# Patient Record
Sex: Female | Born: 1945
Health system: Southern US, Community
[De-identification: ages and names within clinical notes are randomized; demographics above are authoritative.]

## PROBLEM LIST (undated history)

## (undated) DIAGNOSIS — I420 Dilated cardiomyopathy: Secondary | ICD-10-CM

## (undated) DIAGNOSIS — F329 Major depressive disorder, single episode, unspecified: Secondary | ICD-10-CM

## (undated) DIAGNOSIS — S0990XA Unspecified injury of head, initial encounter: Secondary | ICD-10-CM

## (undated) DIAGNOSIS — A6920 Lyme disease, unspecified: Secondary | ICD-10-CM

## (undated) DIAGNOSIS — M5136 Other intervertebral disc degeneration, lumbar region: Secondary | ICD-10-CM

## (undated) DIAGNOSIS — I10 Essential (primary) hypertension: Secondary | ICD-10-CM

## (undated) DIAGNOSIS — Z86718 Personal history of other venous thrombosis and embolism: Secondary | ICD-10-CM

## (undated) DIAGNOSIS — J309 Allergic rhinitis, unspecified: Secondary | ICD-10-CM

## (undated) DIAGNOSIS — Z87898 Personal history of other specified conditions: Secondary | ICD-10-CM

## (undated) DIAGNOSIS — R1011 Right upper quadrant pain: Secondary | ICD-10-CM

## (undated) DIAGNOSIS — N135 Crossing vessel and stricture of ureter without hydronephrosis: Secondary | ICD-10-CM

## (undated) DIAGNOSIS — I2510 Atherosclerotic heart disease of native coronary artery without angina pectoris: Principal | ICD-10-CM

## (undated) DIAGNOSIS — K682 Retroperitoneal fibrosis: Secondary | ICD-10-CM

## (undated) DIAGNOSIS — H269 Unspecified cataract: Secondary | ICD-10-CM

## (undated) DIAGNOSIS — N1330 Unspecified hydronephrosis: Secondary | ICD-10-CM

## (undated) DIAGNOSIS — T4145XA Adverse effect of unspecified anesthetic, initial encounter: Secondary | ICD-10-CM

## (undated) DIAGNOSIS — M1990 Unspecified osteoarthritis, unspecified site: Secondary | ICD-10-CM

## (undated) DIAGNOSIS — I4821 Permanent atrial fibrillation: Secondary | ICD-10-CM

## (undated) DIAGNOSIS — R0602 Shortness of breath: Secondary | ICD-10-CM

## (undated) DIAGNOSIS — Z8619 Personal history of other infectious and parasitic diseases: Secondary | ICD-10-CM

## (undated) DIAGNOSIS — J189 Pneumonia, unspecified organism: Secondary | ICD-10-CM

## (undated) DIAGNOSIS — I499 Cardiac arrhythmia, unspecified: Secondary | ICD-10-CM

## (undated) DIAGNOSIS — I350 Nonrheumatic aortic (valve) stenosis: Secondary | ICD-10-CM

## (undated) DIAGNOSIS — I639 Cerebral infarction, unspecified: Secondary | ICD-10-CM

## (undated) DIAGNOSIS — F419 Anxiety disorder, unspecified: Secondary | ICD-10-CM

## (undated) DIAGNOSIS — M51369 Other intervertebral disc degeneration, lumbar region without mention of lumbar back pain or lower extremity pain: Secondary | ICD-10-CM

## (undated) DIAGNOSIS — K219 Gastro-esophageal reflux disease without esophagitis: Secondary | ICD-10-CM

## (undated) DIAGNOSIS — E785 Hyperlipidemia, unspecified: Secondary | ICD-10-CM

## (undated) DIAGNOSIS — Z8669 Personal history of other diseases of the nervous system and sense organs: Secondary | ICD-10-CM

## (undated) DIAGNOSIS — R350 Frequency of micturition: Secondary | ICD-10-CM

## (undated) DIAGNOSIS — Z8719 Personal history of other diseases of the digestive system: Secondary | ICD-10-CM

## (undated) HISTORY — DX: Unspecified cataract: H26.9

## (undated) HISTORY — PX: EYE SURGERY: SHX253

## (undated) HISTORY — DX: Right upper quadrant pain: R10.11

## (undated) HISTORY — PX: OTHER SURGICAL HISTORY: SHX169

## (undated) HISTORY — DX: Allergic rhinitis, unspecified: J30.9

## (undated) HISTORY — DX: Shortness of breath: R06.02

## (undated) HISTORY — DX: Major depressive disorder, single episode, unspecified: F32.9

## (undated) HISTORY — DX: Personal history of other specified conditions: Z87.898

## (undated) HISTORY — DX: Hyperlipidemia, unspecified: E78.5

## (undated) HISTORY — PX: TONSILLECTOMY: SUR1361

## (undated) HISTORY — DX: Permanent atrial fibrillation: I48.21

## (undated) HISTORY — DX: Dilated cardiomyopathy: I42.0

## (undated) HISTORY — DX: Nonrheumatic aortic (valve) stenosis: I35.0

## (undated) HISTORY — PX: CARDIOVASCULAR STRESS TEST: SHX262

## (undated) HISTORY — DX: Lyme disease, unspecified: A69.20

## (undated) HISTORY — PX: BREAST MASS EXCISION: SHX1267

## (undated) HISTORY — DX: Unspecified osteoarthritis, unspecified site: M19.90

## (undated) HISTORY — DX: Atherosclerotic heart disease of native coronary artery without angina pectoris: I25.10

---

## 1999-01-09 ENCOUNTER — Other Ambulatory Visit: Admission: RE | Admit: 1999-01-09 | Discharge: 1999-01-09 | Payer: Self-pay | Admitting: Internal Medicine

## 1999-01-09 ENCOUNTER — Encounter (INDEPENDENT_AMBULATORY_CARE_PROVIDER_SITE_OTHER): Payer: Self-pay | Admitting: Specialist

## 2000-10-08 ENCOUNTER — Other Ambulatory Visit: Admission: RE | Admit: 2000-10-08 | Discharge: 2000-10-08 | Payer: Self-pay | Admitting: Internal Medicine

## 2003-03-24 HISTORY — PX: EXPLORATORY LAPAROTOMY: SUR591

## 2003-03-24 HISTORY — PX: KNEE ARTHROSCOPY: SUR90

## 2003-10-25 ENCOUNTER — Ambulatory Visit (HOSPITAL_COMMUNITY): Admission: RE | Admit: 2003-10-25 | Discharge: 2003-10-25 | Payer: Self-pay | Admitting: Urology

## 2003-10-25 ENCOUNTER — Encounter (INDEPENDENT_AMBULATORY_CARE_PROVIDER_SITE_OTHER): Payer: Self-pay | Admitting: *Deleted

## 2003-10-25 ENCOUNTER — Ambulatory Visit (HOSPITAL_BASED_OUTPATIENT_CLINIC_OR_DEPARTMENT_OTHER): Admission: RE | Admit: 2003-10-25 | Discharge: 2003-10-25 | Payer: Self-pay | Admitting: Urology

## 2003-10-25 ENCOUNTER — Encounter (INDEPENDENT_AMBULATORY_CARE_PROVIDER_SITE_OTHER): Payer: Self-pay | Admitting: Specialist

## 2003-10-30 ENCOUNTER — Ambulatory Visit (HOSPITAL_COMMUNITY): Admission: RE | Admit: 2003-10-30 | Discharge: 2003-10-30 | Payer: Self-pay | Admitting: Urology

## 2003-11-07 ENCOUNTER — Encounter (INDEPENDENT_AMBULATORY_CARE_PROVIDER_SITE_OTHER): Payer: Self-pay | Admitting: *Deleted

## 2003-11-07 ENCOUNTER — Ambulatory Visit (HOSPITAL_COMMUNITY): Admission: RE | Admit: 2003-11-07 | Discharge: 2003-11-07 | Payer: Self-pay | Admitting: Urology

## 2003-12-06 ENCOUNTER — Encounter (INDEPENDENT_AMBULATORY_CARE_PROVIDER_SITE_OTHER): Payer: Self-pay | Admitting: Urology

## 2003-12-06 ENCOUNTER — Inpatient Hospital Stay (HOSPITAL_COMMUNITY): Admission: RE | Admit: 2003-12-06 | Discharge: 2003-12-08 | Payer: Self-pay | Admitting: Urology

## 2003-12-06 ENCOUNTER — Encounter (INDEPENDENT_AMBULATORY_CARE_PROVIDER_SITE_OTHER): Payer: Self-pay | Admitting: *Deleted

## 2004-02-18 ENCOUNTER — Ambulatory Visit (HOSPITAL_COMMUNITY): Admission: RE | Admit: 2004-02-18 | Discharge: 2004-02-18 | Payer: Self-pay | Admitting: Urology

## 2004-02-19 ENCOUNTER — Ambulatory Visit: Payer: Self-pay | Admitting: Hematology and Oncology

## 2004-02-26 ENCOUNTER — Ambulatory Visit (HOSPITAL_COMMUNITY): Admission: RE | Admit: 2004-02-26 | Discharge: 2004-02-26 | Payer: Self-pay | Admitting: Hematology and Oncology

## 2004-03-05 ENCOUNTER — Ambulatory Visit (HOSPITAL_COMMUNITY): Admission: RE | Admit: 2004-03-05 | Discharge: 2004-03-05 | Payer: Self-pay | Admitting: Hematology and Oncology

## 2004-03-05 ENCOUNTER — Encounter (INDEPENDENT_AMBULATORY_CARE_PROVIDER_SITE_OTHER): Payer: Self-pay | Admitting: Interventional Radiology

## 2004-03-05 ENCOUNTER — Encounter (INDEPENDENT_AMBULATORY_CARE_PROVIDER_SITE_OTHER): Payer: Self-pay | Admitting: *Deleted

## 2004-03-12 ENCOUNTER — Ambulatory Visit (HOSPITAL_COMMUNITY): Admission: RE | Admit: 2004-03-12 | Discharge: 2004-03-12 | Payer: Self-pay | Admitting: Hematology and Oncology

## 2004-03-13 ENCOUNTER — Encounter (INDEPENDENT_AMBULATORY_CARE_PROVIDER_SITE_OTHER): Payer: Self-pay | Admitting: *Deleted

## 2004-03-13 ENCOUNTER — Other Ambulatory Visit: Admission: RE | Admit: 2004-03-13 | Discharge: 2004-03-13 | Payer: Self-pay | Admitting: Hematology and Oncology

## 2004-03-20 ENCOUNTER — Ambulatory Visit (HOSPITAL_BASED_OUTPATIENT_CLINIC_OR_DEPARTMENT_OTHER): Admission: RE | Admit: 2004-03-20 | Discharge: 2004-03-20 | Payer: Self-pay | Admitting: Urology

## 2004-03-20 ENCOUNTER — Ambulatory Visit (HOSPITAL_COMMUNITY): Admission: RE | Admit: 2004-03-20 | Discharge: 2004-03-20 | Payer: Self-pay | Admitting: Urology

## 2004-03-21 ENCOUNTER — Ambulatory Visit (HOSPITAL_COMMUNITY): Admission: RE | Admit: 2004-03-21 | Discharge: 2004-03-21 | Payer: Self-pay | Admitting: Hematology and Oncology

## 2004-03-25 ENCOUNTER — Ambulatory Visit: Admission: RE | Admit: 2004-03-25 | Discharge: 2004-05-02 | Payer: Self-pay | Admitting: Radiation Oncology

## 2004-03-26 ENCOUNTER — Ambulatory Visit (HOSPITAL_COMMUNITY): Admission: RE | Admit: 2004-03-26 | Discharge: 2004-03-26 | Payer: Self-pay | Admitting: Hematology and Oncology

## 2004-04-10 ENCOUNTER — Ambulatory Visit: Payer: Self-pay | Admitting: Hematology and Oncology

## 2004-04-17 ENCOUNTER — Ambulatory Visit (HOSPITAL_COMMUNITY): Admission: RE | Admit: 2004-04-17 | Discharge: 2004-04-17 | Payer: Self-pay | Admitting: Hematology and Oncology

## 2004-05-28 ENCOUNTER — Ambulatory Visit: Payer: Self-pay | Admitting: Hematology and Oncology

## 2004-07-15 ENCOUNTER — Ambulatory Visit: Payer: Self-pay | Admitting: Hematology and Oncology

## 2004-08-05 ENCOUNTER — Ambulatory Visit (HOSPITAL_COMMUNITY): Admission: RE | Admit: 2004-08-05 | Discharge: 2004-08-05 | Payer: Self-pay | Admitting: Hematology and Oncology

## 2004-08-07 ENCOUNTER — Ambulatory Visit (HOSPITAL_BASED_OUTPATIENT_CLINIC_OR_DEPARTMENT_OTHER): Admission: RE | Admit: 2004-08-07 | Discharge: 2004-08-07 | Payer: Self-pay | Admitting: Urology

## 2004-09-02 ENCOUNTER — Ambulatory Visit: Payer: Self-pay | Admitting: Hematology and Oncology

## 2004-10-03 ENCOUNTER — Ambulatory Visit: Admission: RE | Admit: 2004-10-03 | Discharge: 2004-11-30 | Payer: Self-pay | Admitting: Radiation Oncology

## 2004-11-06 ENCOUNTER — Ambulatory Visit: Payer: Self-pay | Admitting: Hematology and Oncology

## 2004-11-27 ENCOUNTER — Ambulatory Visit (HOSPITAL_COMMUNITY): Admission: RE | Admit: 2004-11-27 | Discharge: 2004-11-27 | Payer: Self-pay | Admitting: Urology

## 2004-11-27 ENCOUNTER — Ambulatory Visit (HOSPITAL_BASED_OUTPATIENT_CLINIC_OR_DEPARTMENT_OTHER): Admission: RE | Admit: 2004-11-27 | Discharge: 2004-11-27 | Payer: Self-pay | Admitting: Urology

## 2004-12-24 ENCOUNTER — Ambulatory Visit: Payer: Self-pay | Admitting: Hematology and Oncology

## 2004-12-26 ENCOUNTER — Ambulatory Visit (HOSPITAL_COMMUNITY): Admission: RE | Admit: 2004-12-26 | Discharge: 2004-12-26 | Payer: Self-pay | Admitting: Hematology and Oncology

## 2005-01-29 ENCOUNTER — Ambulatory Visit (HOSPITAL_COMMUNITY): Admission: RE | Admit: 2005-01-29 | Discharge: 2005-01-29 | Payer: Self-pay | Admitting: Hematology and Oncology

## 2005-02-13 ENCOUNTER — Ambulatory Visit: Payer: Self-pay | Admitting: Hematology and Oncology

## 2005-03-05 ENCOUNTER — Ambulatory Visit (HOSPITAL_BASED_OUTPATIENT_CLINIC_OR_DEPARTMENT_OTHER): Admission: RE | Admit: 2005-03-05 | Discharge: 2005-03-05 | Payer: Self-pay | Admitting: Urology

## 2005-03-05 ENCOUNTER — Ambulatory Visit (HOSPITAL_COMMUNITY): Admission: RE | Admit: 2005-03-05 | Discharge: 2005-03-05 | Payer: Self-pay | Admitting: Urology

## 2005-03-23 HISTORY — PX: OTHER SURGICAL HISTORY: SHX169

## 2005-03-31 ENCOUNTER — Ambulatory Visit: Payer: Self-pay | Admitting: Hematology and Oncology

## 2005-05-19 ENCOUNTER — Ambulatory Visit (HOSPITAL_COMMUNITY): Admission: RE | Admit: 2005-05-19 | Discharge: 2005-05-19 | Payer: Self-pay | Admitting: Hematology and Oncology

## 2005-05-27 ENCOUNTER — Ambulatory Visit: Payer: Self-pay | Admitting: Hematology and Oncology

## 2005-06-02 ENCOUNTER — Ambulatory Visit (HOSPITAL_COMMUNITY): Admission: RE | Admit: 2005-06-02 | Discharge: 2005-06-02 | Payer: Self-pay | Admitting: Hematology and Oncology

## 2005-06-11 ENCOUNTER — Ambulatory Visit (HOSPITAL_BASED_OUTPATIENT_CLINIC_OR_DEPARTMENT_OTHER): Admission: RE | Admit: 2005-06-11 | Discharge: 2005-06-11 | Payer: Self-pay | Admitting: Urology

## 2005-06-18 ENCOUNTER — Encounter: Admission: RE | Admit: 2005-06-18 | Discharge: 2005-06-18 | Payer: Self-pay | Admitting: Hematology and Oncology

## 2005-07-07 LAB — PROTIME-INR

## 2005-07-21 ENCOUNTER — Ambulatory Visit: Payer: Self-pay | Admitting: Hematology and Oncology

## 2005-07-21 ENCOUNTER — Encounter: Admission: RE | Admit: 2005-07-21 | Discharge: 2005-07-21 | Payer: Self-pay | Admitting: Hematology and Oncology

## 2005-07-21 LAB — PROTIME-INR: Protime: 17.1 Seconds (ref 10.6–13.4)

## 2005-07-30 LAB — PROTIME-INR: INR: 1.6 — ABNORMAL LOW (ref 2.00–3.50)

## 2005-08-13 LAB — PROTIME-INR
INR: 1.4 — ABNORMAL LOW (ref 2.00–3.50)
Protime: 14.5 Seconds — ABNORMAL HIGH (ref 10.6–13.4)

## 2005-08-28 ENCOUNTER — Ambulatory Visit (HOSPITAL_COMMUNITY): Admission: RE | Admit: 2005-08-28 | Discharge: 2005-08-28 | Payer: Self-pay | Admitting: Hematology and Oncology

## 2005-08-28 LAB — CBC WITH DIFFERENTIAL/PLATELET
BASO%: 0.4 % (ref 0.0–2.0)
EOS%: 0.8 % (ref 0.0–7.0)
MCH: 30.2 pg (ref 26.0–34.0)
MCHC: 33.6 g/dL (ref 32.0–36.0)
RBC: 3.9 10*6/uL (ref 3.70–5.32)
RDW: 14.5 % (ref 11.3–14.5)
lymph#: 0.9 10*3/uL (ref 0.9–3.3)

## 2005-08-28 LAB — COMPREHENSIVE METABOLIC PANEL
AST: 13 U/L (ref 0–37)
Albumin: 4.1 g/dL (ref 3.5–5.2)
Alkaline Phosphatase: 97 U/L (ref 39–117)
BUN: 15 mg/dL (ref 6–23)
Creatinine, Ser: 0.88 mg/dL (ref 0.40–1.20)
Glucose, Bld: 92 mg/dL (ref 70–99)
Potassium: 4.1 mEq/L (ref 3.5–5.3)
Total Bilirubin: 0.5 mg/dL (ref 0.3–1.2)

## 2005-08-28 LAB — PROTIME-INR
INR: 1.9 — ABNORMAL LOW (ref 2.00–3.50)
Protime: 17.1 Seconds — ABNORMAL HIGH (ref 10.6–13.4)

## 2005-09-02 LAB — PROTIME-INR
INR: 2.3 (ref 2.00–3.50)
Protime: 18.5 Seconds — ABNORMAL HIGH (ref 10.6–13.4)

## 2005-09-08 ENCOUNTER — Ambulatory Visit: Payer: Self-pay | Admitting: Hematology and Oncology

## 2005-09-08 LAB — PROTIME-INR

## 2005-09-08 LAB — COMPREHENSIVE METABOLIC PANEL
ALT: 12 U/L (ref 0–40)
AST: 15 U/L (ref 0–37)
CO2: 23 mEq/L (ref 19–32)
Chloride: 109 mEq/L (ref 96–112)
Sodium: 140 mEq/L (ref 135–145)
Total Bilirubin: 0.4 mg/dL (ref 0.3–1.2)
Total Protein: 6.1 g/dL (ref 6.0–8.3)

## 2005-09-08 LAB — CBC WITH DIFFERENTIAL/PLATELET
Basophils Absolute: 0 10*3/uL (ref 0.0–0.1)
EOS%: 1.1 % (ref 0.0–7.0)
Eosinophils Absolute: 0 10*3/uL (ref 0.0–0.5)
HGB: 11.2 g/dL — ABNORMAL LOW (ref 11.6–15.9)
MCV: 89.6 fL (ref 81.0–101.0)
MONO%: 11.8 % (ref 0.0–13.0)
NEUT#: 2 10*3/uL (ref 1.5–6.5)
RBC: 3.69 10*6/uL — ABNORMAL LOW (ref 3.70–5.32)
RDW: 14.9 % — ABNORMAL HIGH (ref 11.3–14.5)
WBC: 3.3 10*3/uL — ABNORMAL LOW (ref 3.9–10.0)
lymph#: 0.9 10*3/uL (ref 0.9–3.3)

## 2005-10-09 LAB — PROTIME-INR: INR: 2.1 (ref 2.00–3.50)

## 2005-10-20 ENCOUNTER — Ambulatory Visit (HOSPITAL_COMMUNITY): Admission: RE | Admit: 2005-10-20 | Discharge: 2005-10-20 | Payer: Self-pay | Admitting: Hematology and Oncology

## 2005-10-20 ENCOUNTER — Encounter (INDEPENDENT_AMBULATORY_CARE_PROVIDER_SITE_OTHER): Payer: Self-pay | Admitting: Specialist

## 2005-10-26 ENCOUNTER — Ambulatory Visit: Payer: Self-pay | Admitting: Hematology and Oncology

## 2005-10-26 LAB — PROTIME-INR
INR: 1.3 — ABNORMAL LOW (ref 2.00–3.50)
Protime: 14.1 Seconds — ABNORMAL HIGH (ref 10.6–13.4)

## 2005-10-27 ENCOUNTER — Ambulatory Visit (HOSPITAL_BASED_OUTPATIENT_CLINIC_OR_DEPARTMENT_OTHER): Admission: RE | Admit: 2005-10-27 | Discharge: 2005-10-27 | Payer: Self-pay | Admitting: Urology

## 2005-10-30 LAB — PROTIME-INR
INR: 1.9 (ref 2.00–3.50)
Protime: 17.1 Seconds (ref 10.6–13.4)

## 2005-11-13 LAB — PROTIME-INR: INR: 1.5 — ABNORMAL LOW (ref 2.00–3.50)

## 2005-11-30 ENCOUNTER — Ambulatory Visit (HOSPITAL_COMMUNITY): Admission: RE | Admit: 2005-11-30 | Discharge: 2005-11-30 | Payer: Self-pay | Admitting: Hematology and Oncology

## 2005-12-04 LAB — CBC WITH DIFFERENTIAL/PLATELET
Basophils Absolute: 0 10*3/uL (ref 0.0–0.1)
EOS%: 1.5 % (ref 0.0–7.0)
HCT: 34.2 % — ABNORMAL LOW (ref 34.8–46.6)
HGB: 11.9 g/dL (ref 11.6–15.9)
LYMPH%: 28.1 % (ref 14.0–48.0)
MCH: 31 pg (ref 26.0–34.0)
MCV: 89.4 fL (ref 81.0–101.0)
MONO%: 11.4 % (ref 0.0–13.0)
NEUT%: 58.6 % (ref 39.6–76.8)
Platelets: 202 10*3/uL (ref 145–400)

## 2005-12-04 LAB — COMPREHENSIVE METABOLIC PANEL
ALT: 12 U/L (ref 0–40)
AST: 11 U/L (ref 0–37)
Alkaline Phosphatase: 79 U/L (ref 39–117)
BUN: 20 mg/dL (ref 6–23)
Creatinine, Ser: 0.96 mg/dL (ref 0.40–1.20)
Potassium: 4.6 mEq/L (ref 3.5–5.3)

## 2005-12-04 LAB — PROTIME-INR
INR: 3.8 — ABNORMAL HIGH (ref 2.00–3.50)
Protime: 45.6 Seconds — ABNORMAL HIGH (ref 10.6–13.4)

## 2005-12-08 ENCOUNTER — Ambulatory Visit: Payer: Self-pay | Admitting: Hematology and Oncology

## 2005-12-08 LAB — PROTIME-INR: INR: 1.7 — ABNORMAL LOW (ref 2.00–3.50)

## 2006-01-20 ENCOUNTER — Ambulatory Visit: Payer: Self-pay | Admitting: Hematology and Oncology

## 2006-01-22 LAB — PROTIME-INR
INR: 2.2 (ref 2.00–3.50)
Protime: 26.4 Seconds — ABNORMAL HIGH (ref 10.6–13.4)

## 2006-02-23 LAB — PROTIME-INR
INR: 1.9 — ABNORMAL LOW (ref 2.00–3.50)
Protime: 22.8 Seconds — ABNORMAL HIGH (ref 10.6–13.4)

## 2006-03-03 ENCOUNTER — Ambulatory Visit: Payer: Self-pay | Admitting: Hematology and Oncology

## 2006-03-09 LAB — COMPREHENSIVE METABOLIC PANEL
AST: 14 U/L (ref 0–37)
Alkaline Phosphatase: 80 U/L (ref 39–117)
Glucose, Bld: 128 mg/dL — ABNORMAL HIGH (ref 70–99)
Potassium: 4.3 mEq/L (ref 3.5–5.3)
Sodium: 139 mEq/L (ref 135–145)
Total Bilirubin: 0.4 mg/dL (ref 0.3–1.2)
Total Protein: 6.6 g/dL (ref 6.0–8.3)

## 2006-03-09 LAB — CBC WITH DIFFERENTIAL/PLATELET
EOS%: 0.9 % (ref 0.0–7.0)
Eosinophils Absolute: 0 10*3/uL (ref 0.0–0.5)
LYMPH%: 28.1 % (ref 14.0–48.0)
MCH: 30.9 pg (ref 26.0–34.0)
MCHC: 33.6 g/dL (ref 32.0–36.0)
MCV: 91.8 fL (ref 81.0–101.0)
MONO%: 7.4 % (ref 0.0–13.0)
NEUT#: 2.7 10*3/uL (ref 1.5–6.5)
Platelets: 239 10*3/uL (ref 145–400)
RBC: 4.06 10*6/uL (ref 3.70–5.32)
RDW: 14.2 % (ref 11.3–14.5)

## 2006-03-09 LAB — PROTIME-INR
INR: 2.2 (ref 2.00–3.50)
Protime: 26.4 Seconds — ABNORMAL HIGH (ref 10.6–13.4)

## 2006-03-30 LAB — COMPREHENSIVE METABOLIC PANEL
ALT: 15 U/L (ref 0–35)
Alkaline Phosphatase: 79 U/L (ref 39–117)
CO2: 25 mEq/L (ref 19–32)
Creatinine, Ser: 0.94 mg/dL (ref 0.40–1.20)
Glucose, Bld: 99 mg/dL (ref 70–99)
Sodium: 139 mEq/L (ref 135–145)
Total Bilirubin: 0.4 mg/dL (ref 0.3–1.2)
Total Protein: 6.5 g/dL (ref 6.0–8.3)

## 2006-03-30 LAB — LACTATE DEHYDROGENASE: LDH: 156 U/L (ref 94–250)

## 2006-03-30 LAB — CBC WITH DIFFERENTIAL/PLATELET
BASO%: 0.3 % (ref 0.0–2.0)
EOS%: 0.8 % (ref 0.0–7.0)
MCH: 30.6 pg (ref 26.0–34.0)
MCHC: 33.6 g/dL (ref 32.0–36.0)
MONO#: 0.4 10*3/uL (ref 0.1–0.9)
RBC: 3.99 10*6/uL (ref 3.70–5.32)
WBC: 3.7 10*3/uL — ABNORMAL LOW (ref 3.9–10.0)
lymph#: 1.1 10*3/uL (ref 0.9–3.3)

## 2006-03-30 LAB — PROTIME-INR: Protime: 24 Seconds — ABNORMAL HIGH (ref 10.6–13.4)

## 2006-04-01 ENCOUNTER — Ambulatory Visit (HOSPITAL_COMMUNITY): Admission: RE | Admit: 2006-04-01 | Discharge: 2006-04-01 | Payer: Self-pay | Admitting: Hematology and Oncology

## 2006-04-21 ENCOUNTER — Ambulatory Visit (HOSPITAL_BASED_OUTPATIENT_CLINIC_OR_DEPARTMENT_OTHER): Admission: RE | Admit: 2006-04-21 | Discharge: 2006-04-21 | Payer: Self-pay | Admitting: Urology

## 2006-05-06 ENCOUNTER — Ambulatory Visit: Payer: Self-pay | Admitting: Hematology and Oncology

## 2006-05-11 LAB — PROTIME-INR: Protime: 26.4 Seconds — ABNORMAL HIGH (ref 10.6–13.4)

## 2006-07-15 ENCOUNTER — Encounter: Admission: RE | Admit: 2006-07-15 | Discharge: 2006-07-15 | Payer: Self-pay | Admitting: Family Medicine

## 2006-07-27 ENCOUNTER — Ambulatory Visit: Payer: Self-pay | Admitting: Hematology and Oncology

## 2006-07-30 LAB — CBC WITH DIFFERENTIAL/PLATELET
BASO%: 0.3 % (ref 0.0–2.0)
EOS%: 1.5 % (ref 0.0–7.0)
HCT: 36.4 % (ref 34.8–46.6)
LYMPH%: 25.1 % (ref 14.0–48.0)
MCH: 31.1 pg (ref 26.0–34.0)
MCHC: 35.4 g/dL (ref 32.0–36.0)
MCV: 87.9 fL (ref 81.0–101.0)
MONO%: 9.9 % (ref 0.0–13.0)
NEUT%: 63.2 % (ref 39.6–76.8)
Platelets: 242 10*3/uL (ref 145–400)
RBC: 4.14 10*6/uL (ref 3.70–5.32)

## 2006-07-30 LAB — PROTIME-INR: Protime: 21.6 Seconds — ABNORMAL HIGH (ref 10.6–13.4)

## 2006-07-30 LAB — COMPREHENSIVE METABOLIC PANEL
BUN: 22 mg/dL (ref 6–23)
CO2: 26 mEq/L (ref 19–32)
Creatinine, Ser: 1.24 mg/dL — ABNORMAL HIGH (ref 0.40–1.20)
Glucose, Bld: 130 mg/dL — ABNORMAL HIGH (ref 70–99)
Sodium: 143 mEq/L (ref 135–145)
Total Bilirubin: 0.4 mg/dL (ref 0.3–1.2)
Total Protein: 6.6 g/dL (ref 6.0–8.3)

## 2006-07-30 LAB — LACTATE DEHYDROGENASE: LDH: 169 U/L (ref 94–250)

## 2006-08-02 ENCOUNTER — Ambulatory Visit (HOSPITAL_COMMUNITY): Admission: RE | Admit: 2006-08-02 | Discharge: 2006-08-02 | Payer: Self-pay | Admitting: Hematology and Oncology

## 2006-08-18 ENCOUNTER — Ambulatory Visit: Payer: Self-pay | Admitting: Internal Medicine

## 2006-08-18 LAB — PROTIME-INR
INR: 1.7 — ABNORMAL LOW (ref 2.00–3.50)
Protime: 20.4 Seconds — ABNORMAL HIGH (ref 10.6–13.4)

## 2006-09-13 ENCOUNTER — Ambulatory Visit: Payer: Self-pay | Admitting: Hematology and Oncology

## 2006-09-21 LAB — PROTIME-INR: Protime: 15.6 Seconds — ABNORMAL HIGH (ref 10.6–13.4)

## 2006-09-30 LAB — PROTIME-INR: INR: 1.8 — ABNORMAL LOW (ref 2.00–3.50)

## 2006-10-15 LAB — CBC WITH DIFFERENTIAL/PLATELET
Basophils Absolute: 0 10*3/uL (ref 0.0–0.1)
EOS%: 1.8 % (ref 0.0–7.0)
Eosinophils Absolute: 0.1 10*3/uL (ref 0.0–0.5)
HGB: 12.1 g/dL (ref 11.6–15.9)
MCH: 30.9 pg (ref 26.0–34.0)
NEUT#: 2.9 10*3/uL (ref 1.5–6.5)
RDW: 14.8 % — ABNORMAL HIGH (ref 11.3–14.5)
WBC: 4.3 10*3/uL (ref 3.9–10.0)
lymph#: 1 10*3/uL (ref 0.9–3.3)

## 2006-10-15 LAB — BASIC METABOLIC PANEL
BUN: 13 mg/dL (ref 6–23)
Chloride: 111 mEq/L (ref 96–112)
Potassium: 3.9 mEq/L (ref 3.5–5.3)

## 2006-10-15 LAB — PROTIME-INR: INR: 3 (ref 2.00–3.50)

## 2006-10-20 ENCOUNTER — Ambulatory Visit (HOSPITAL_BASED_OUTPATIENT_CLINIC_OR_DEPARTMENT_OTHER): Admission: RE | Admit: 2006-10-20 | Discharge: 2006-10-20 | Payer: Self-pay | Admitting: Urology

## 2006-10-28 LAB — PROTIME-INR: INR: 3.5 (ref 2.00–3.50)

## 2006-11-04 ENCOUNTER — Encounter: Payer: Self-pay | Admitting: Internal Medicine

## 2006-11-04 ENCOUNTER — Ambulatory Visit: Payer: Self-pay | Admitting: Internal Medicine

## 2006-11-07 ENCOUNTER — Ambulatory Visit: Payer: Self-pay | Admitting: Hematology and Oncology

## 2006-11-11 LAB — COMPREHENSIVE METABOLIC PANEL
ALT: 13 U/L (ref 0–35)
Albumin: 4.2 g/dL (ref 3.5–5.2)
CO2: 21 mEq/L (ref 19–32)
Potassium: 4.5 mEq/L (ref 3.5–5.3)
Sodium: 141 mEq/L (ref 135–145)
Total Bilirubin: 0.4 mg/dL (ref 0.3–1.2)
Total Protein: 6.5 g/dL (ref 6.0–8.3)

## 2006-11-11 LAB — CBC WITH DIFFERENTIAL/PLATELET
BASO%: 0.2 % (ref 0.0–2.0)
Basophils Absolute: 0 10*3/uL (ref 0.0–0.1)
Eosinophils Absolute: 0.2 10*3/uL (ref 0.0–0.5)
HCT: 36.6 % (ref 34.8–46.6)
HGB: 13 g/dL (ref 11.6–15.9)
MONO#: 0.4 10*3/uL (ref 0.1–0.9)
NEUT#: 2.9 10*3/uL (ref 1.5–6.5)
NEUT%: 61.3 % (ref 39.6–76.8)
Platelets: 233 10*3/uL (ref 145–400)
WBC: 4.7 10*3/uL (ref 3.9–10.0)
lymph#: 1.2 10*3/uL (ref 0.9–3.3)

## 2006-11-11 LAB — PROTIME-INR

## 2006-11-11 LAB — LACTATE DEHYDROGENASE: LDH: 174 U/L (ref 94–250)

## 2006-11-29 ENCOUNTER — Ambulatory Visit (HOSPITAL_COMMUNITY): Admission: RE | Admit: 2006-11-29 | Discharge: 2006-11-29 | Payer: Self-pay | Admitting: Hematology and Oncology

## 2006-11-29 LAB — CBC WITH DIFFERENTIAL/PLATELET
BASO%: 0.5 % (ref 0.0–2.0)
EOS%: 1.8 % (ref 0.0–7.0)
HCT: 36.5 % (ref 34.8–46.6)
LYMPH%: 24.9 % (ref 14.0–48.0)
MCH: 31.1 pg (ref 26.0–34.0)
MCHC: 35.4 g/dL (ref 32.0–36.0)
NEUT%: 62.5 % (ref 39.6–76.8)
Platelets: 238 10*3/uL (ref 145–400)
lymph#: 1 10*3/uL (ref 0.9–3.3)

## 2006-11-29 LAB — COMPREHENSIVE METABOLIC PANEL
ALT: 17 U/L (ref 0–35)
AST: 18 U/L (ref 0–37)
Creatinine, Ser: 1.01 mg/dL (ref 0.40–1.20)
Total Bilirubin: 0.6 mg/dL (ref 0.3–1.2)

## 2006-11-29 LAB — LACTATE DEHYDROGENASE: LDH: 146 U/L (ref 94–250)

## 2006-12-01 ENCOUNTER — Ambulatory Visit: Admission: RE | Admit: 2006-12-01 | Discharge: 2006-12-01 | Payer: Self-pay | Admitting: Hematology and Oncology

## 2006-12-01 ENCOUNTER — Encounter: Payer: Self-pay | Admitting: Hematology and Oncology

## 2006-12-01 ENCOUNTER — Ambulatory Visit: Payer: Self-pay | Admitting: Vascular Surgery

## 2006-12-01 LAB — PROTIME-INR: Protime: 38.4 Seconds — ABNORMAL HIGH (ref 10.6–13.4)

## 2006-12-23 ENCOUNTER — Ambulatory Visit: Payer: Self-pay | Admitting: Hematology and Oncology

## 2006-12-23 LAB — PROTIME-INR: INR: 3.1 (ref 2.00–3.50)

## 2007-02-03 LAB — PROTIME-INR
INR: 2.3 (ref 2.00–3.50)
Protime: 27.6 Seconds — ABNORMAL HIGH (ref 10.6–13.4)

## 2007-02-16 ENCOUNTER — Ambulatory Visit: Payer: Self-pay | Admitting: Hematology and Oncology

## 2007-02-22 LAB — PROTIME-INR: INR: 3.8 — ABNORMAL HIGH (ref 2.00–3.50)

## 2007-03-15 LAB — PROTIME-INR
INR: 3.3 (ref 2.00–3.50)
Protime: 39.6 Seconds — ABNORMAL HIGH (ref 10.6–13.4)

## 2007-03-23 ENCOUNTER — Ambulatory Visit: Payer: Self-pay | Admitting: Hematology and Oncology

## 2007-03-28 LAB — COMPREHENSIVE METABOLIC PANEL
Albumin: 4.3 g/dL (ref 3.5–5.2)
CO2: 25 mEq/L (ref 19–32)
Calcium: 9.7 mg/dL (ref 8.4–10.5)
Glucose, Bld: 108 mg/dL — ABNORMAL HIGH (ref 70–99)
Total Bilirubin: 0.4 mg/dL (ref 0.3–1.2)
Total Protein: 6.9 g/dL (ref 6.0–8.3)

## 2007-03-28 LAB — CBC WITH DIFFERENTIAL/PLATELET
BASO%: 0.5 % (ref 0.0–2.0)
Basophils Absolute: 0 10*3/uL (ref 0.0–0.1)
EOS%: 1.1 % (ref 0.0–7.0)
HGB: 12.6 g/dL (ref 11.6–15.9)
MCH: 29.6 pg (ref 26.0–34.0)
MCHC: 34 g/dL (ref 32.0–36.0)
MCV: 86.9 fL (ref 81.0–101.0)
MONO%: 9.6 % (ref 0.0–13.0)
NEUT%: 59.4 % (ref 39.6–76.8)
RDW: 15 % — ABNORMAL HIGH (ref 11.3–14.5)
lymph#: 1.3 10*3/uL (ref 0.9–3.3)

## 2007-03-28 LAB — LACTATE DEHYDROGENASE: LDH: 183 U/L (ref 94–250)

## 2007-03-29 ENCOUNTER — Ambulatory Visit (HOSPITAL_COMMUNITY): Admission: RE | Admit: 2007-03-29 | Discharge: 2007-03-29 | Payer: Self-pay | Admitting: Hematology and Oncology

## 2007-04-04 LAB — PROTIME-INR
INR: 4.7 — ABNORMAL HIGH (ref 2.00–3.50)
Protime: 56.4 s — ABNORMAL HIGH (ref 10.6–13.4)

## 2007-04-06 LAB — PROTIME-INR: Protime: 21.6 Seconds — ABNORMAL HIGH (ref 10.6–13.4)

## 2007-04-18 LAB — PROTIME-INR: INR: 2.6 (ref 2.00–3.50)

## 2007-05-03 LAB — PROTIME-INR: Protime: 39.6 Seconds — ABNORMAL HIGH (ref 10.6–13.4)

## 2007-05-06 ENCOUNTER — Ambulatory Visit: Payer: Self-pay | Admitting: Hematology and Oncology

## 2007-06-07 LAB — PROTIME-INR: INR: 2.6 (ref 2.00–3.50)

## 2007-07-11 ENCOUNTER — Ambulatory Visit (HOSPITAL_BASED_OUTPATIENT_CLINIC_OR_DEPARTMENT_OTHER): Admission: RE | Admit: 2007-07-11 | Discharge: 2007-07-11 | Payer: Self-pay | Admitting: Urology

## 2007-07-15 ENCOUNTER — Ambulatory Visit: Payer: Self-pay | Admitting: Hematology and Oncology

## 2007-07-19 LAB — PROTIME-INR: INR: 1.7 — ABNORMAL LOW (ref 2.00–3.50)

## 2007-08-02 LAB — PROTIME-INR: Protime: 32.4 Seconds — ABNORMAL HIGH (ref 10.6–13.4)

## 2007-08-26 ENCOUNTER — Ambulatory Visit: Payer: Self-pay | Admitting: Hematology and Oncology

## 2007-09-22 LAB — CBC WITH DIFFERENTIAL/PLATELET
Basophils Absolute: 0 10*3/uL (ref 0.0–0.1)
Eosinophils Absolute: 0 10*3/uL (ref 0.0–0.5)
HCT: 35.9 % (ref 34.8–46.6)
HGB: 12.2 g/dL (ref 11.6–15.9)
LYMPH%: 29.4 % (ref 14.0–48.0)
MCV: 86.9 fL (ref 81.0–101.0)
MONO#: 0.4 10*3/uL (ref 0.1–0.9)
MONO%: 11.1 % (ref 0.0–13.0)
NEUT#: 2.1 10*3/uL (ref 1.5–6.5)
Platelets: 216 10*3/uL (ref 145–400)
WBC: 3.6 10*3/uL — ABNORMAL LOW (ref 3.9–10.0)

## 2007-09-22 LAB — COMPREHENSIVE METABOLIC PANEL WITH GFR
ALT: 16 U/L (ref 0–35)
AST: 18 U/L (ref 0–37)
Albumin: 3.7 g/dL (ref 3.5–5.2)
Alkaline Phosphatase: 65 U/L (ref 39–117)
BUN: 14 mg/dL (ref 6–23)
CO2: 27 meq/L (ref 19–32)
Calcium: 9.4 mg/dL (ref 8.4–10.5)
Chloride: 106 meq/L (ref 96–112)
Creatinine, Ser: 0.97 mg/dL (ref 0.40–1.20)
Glucose, Bld: 104 mg/dL — ABNORMAL HIGH (ref 70–99)
Potassium: 4.4 meq/L (ref 3.5–5.3)
Sodium: 141 meq/L (ref 135–145)
Total Bilirubin: 0.8 mg/dL (ref 0.3–1.2)
Total Protein: 6.4 g/dL (ref 6.0–8.3)

## 2007-09-22 LAB — LACTATE DEHYDROGENASE: LDH: 142 U/L (ref 94–250)

## 2007-09-27 ENCOUNTER — Ambulatory Visit (HOSPITAL_COMMUNITY): Admission: RE | Admit: 2007-09-27 | Discharge: 2007-09-27 | Payer: Self-pay | Admitting: Hematology and Oncology

## 2007-09-29 LAB — PROTIME-INR
INR: 4 — ABNORMAL HIGH (ref 2.00–3.50)
Protime: 48 Seconds — ABNORMAL HIGH (ref 10.6–13.4)

## 2007-10-03 ENCOUNTER — Ambulatory Visit: Payer: Self-pay | Admitting: Hematology and Oncology

## 2007-10-04 ENCOUNTER — Encounter: Admission: RE | Admit: 2007-10-04 | Discharge: 2007-10-04 | Payer: Self-pay | Admitting: Family Medicine

## 2007-10-05 LAB — PROTIME-INR: INR: 1.9 — ABNORMAL LOW (ref 2.00–3.50)

## 2007-10-18 ENCOUNTER — Encounter: Payer: Self-pay | Admitting: Family Medicine

## 2007-10-19 LAB — PROTIME-INR: INR: 3.5 (ref 2.00–3.50)

## 2007-11-18 ENCOUNTER — Ambulatory Visit: Payer: Self-pay | Admitting: Hematology and Oncology

## 2007-11-22 LAB — PROTIME-INR
INR: 2.5 (ref 2.00–3.50)
Protime: 30 Seconds — ABNORMAL HIGH (ref 10.6–13.4)

## 2008-01-18 ENCOUNTER — Ambulatory Visit (HOSPITAL_BASED_OUTPATIENT_CLINIC_OR_DEPARTMENT_OTHER): Admission: RE | Admit: 2008-01-18 | Discharge: 2008-01-18 | Payer: Self-pay | Admitting: Urology

## 2008-01-25 ENCOUNTER — Ambulatory Visit: Payer: Self-pay | Admitting: Hematology and Oncology

## 2008-01-27 LAB — PROTIME-INR

## 2008-02-09 LAB — PROTIME-INR: Protime: 38.4 Seconds — ABNORMAL HIGH (ref 10.6–13.4)

## 2008-03-12 ENCOUNTER — Ambulatory Visit: Payer: Self-pay | Admitting: Hematology and Oncology

## 2008-03-23 HISTORY — PX: TOTAL KNEE ARTHROPLASTY: SHX125

## 2008-03-26 LAB — CBC WITH DIFFERENTIAL/PLATELET
BASO%: 0.2 % (ref 0.0–2.0)
EOS%: 0.7 % (ref 0.0–7.0)
MCH: 30.6 pg (ref 26.0–34.0)
MCHC: 33.8 g/dL (ref 32.0–36.0)
MCV: 90.5 fL (ref 81.0–101.0)
MONO%: 8.3 % (ref 0.0–13.0)
NEUT%: 62.5 % (ref 39.6–76.8)
RDW: 14.4 % (ref 11.3–14.5)
lymph#: 1.5 10*3/uL (ref 0.9–3.3)

## 2008-03-26 LAB — PROTIME-INR
INR: 1.7 — ABNORMAL LOW (ref 2.00–3.50)
Protime: 20.4 s — ABNORMAL HIGH (ref 10.6–13.4)

## 2008-03-26 LAB — COMPREHENSIVE METABOLIC PANEL
ALT: 15 U/L (ref 0–35)
BUN: 16 mg/dL (ref 6–23)
CO2: 22 mEq/L (ref 19–32)
Calcium: 9 mg/dL (ref 8.4–10.5)
Chloride: 111 mEq/L (ref 96–112)
Creatinine, Ser: 1.1 mg/dL (ref 0.40–1.20)
Total Bilirubin: 0.3 mg/dL (ref 0.3–1.2)

## 2008-03-27 ENCOUNTER — Ambulatory Visit (HOSPITAL_COMMUNITY): Admission: RE | Admit: 2008-03-27 | Discharge: 2008-03-27 | Payer: Self-pay | Admitting: Hematology and Oncology

## 2008-03-29 LAB — PROTIME-INR
INR: 2.5 (ref 2.00–3.50)
Protime: 30 Seconds — ABNORMAL HIGH (ref 10.6–13.4)

## 2008-04-04 ENCOUNTER — Inpatient Hospital Stay (HOSPITAL_COMMUNITY): Admission: RE | Admit: 2008-04-04 | Discharge: 2008-04-07 | Payer: Self-pay | Admitting: Orthopedic Surgery

## 2008-04-26 LAB — PROTIME-INR
INR: 3.2 (ref 2.00–3.50)
Protime: 38.4 Seconds — ABNORMAL HIGH (ref 10.6–13.4)

## 2008-05-08 ENCOUNTER — Ambulatory Visit: Payer: Self-pay | Admitting: Hematology and Oncology

## 2008-05-10 LAB — PROTIME-INR: INR: 2.9 (ref 2.00–3.50)

## 2008-05-30 ENCOUNTER — Ambulatory Visit: Payer: Self-pay | Admitting: Family Medicine

## 2008-05-30 DIAGNOSIS — R5381 Other malaise: Secondary | ICD-10-CM | POA: Insufficient documentation

## 2008-05-30 DIAGNOSIS — R5383 Other fatigue: Secondary | ICD-10-CM

## 2008-05-31 LAB — CONVERTED CEMR LAB
ALT: 12 units/L (ref 0–35)
Alkaline Phosphatase: 83 units/L (ref 39–117)
Bilirubin, Direct: 0.1 mg/dL (ref 0.0–0.3)
CO2: 27 meq/L (ref 19–32)
Chloride: 109 meq/L (ref 96–112)
Glucose, Bld: 104 mg/dL — ABNORMAL HIGH (ref 70–99)
Hemoglobin: 12.1 g/dL (ref 12.0–15.0)
Lymphocytes Relative: 29.2 % (ref 12.0–46.0)
Monocytes Relative: 11.8 % (ref 3.0–12.0)
Neutrophils Relative %: 57 % (ref 43.0–77.0)
Platelets: 204 10*3/uL (ref 150–400)
Potassium: 4.2 meq/L (ref 3.5–5.1)
RDW: 14.5 % (ref 11.5–14.6)
Sodium: 143 meq/L (ref 135–145)
Total Protein: 6.2 g/dL (ref 6.0–8.3)

## 2008-06-05 ENCOUNTER — Telehealth: Payer: Self-pay | Admitting: Family Medicine

## 2008-06-06 ENCOUNTER — Telehealth: Payer: Self-pay | Admitting: *Deleted

## 2008-06-06 ENCOUNTER — Encounter: Payer: Self-pay | Admitting: Family Medicine

## 2008-06-13 ENCOUNTER — Ambulatory Visit: Payer: Self-pay | Admitting: Family Medicine

## 2008-06-13 DIAGNOSIS — F3289 Other specified depressive episodes: Secondary | ICD-10-CM

## 2008-06-13 DIAGNOSIS — F329 Major depressive disorder, single episode, unspecified: Secondary | ICD-10-CM

## 2008-06-13 DIAGNOSIS — F324 Major depressive disorder, single episode, in partial remission: Secondary | ICD-10-CM | POA: Insufficient documentation

## 2008-06-13 HISTORY — DX: Other specified depressive episodes: F32.89

## 2008-06-13 HISTORY — DX: Major depressive disorder, single episode, unspecified: F32.9

## 2008-06-19 ENCOUNTER — Ambulatory Visit: Payer: Self-pay | Admitting: Hematology and Oncology

## 2008-06-21 LAB — PROTIME-INR: Protime: 30 Seconds — ABNORMAL HIGH (ref 10.6–13.4)

## 2008-07-11 ENCOUNTER — Ambulatory Visit (HOSPITAL_BASED_OUTPATIENT_CLINIC_OR_DEPARTMENT_OTHER): Admission: RE | Admit: 2008-07-11 | Discharge: 2008-07-11 | Payer: Self-pay | Admitting: Urology

## 2008-08-22 ENCOUNTER — Ambulatory Visit: Payer: Self-pay | Admitting: Hematology and Oncology

## 2008-08-24 LAB — PROTIME-INR
INR: 1.5 — ABNORMAL LOW (ref 2.00–3.50)
Protime: 18 Seconds — ABNORMAL HIGH (ref 10.6–13.4)

## 2008-09-19 ENCOUNTER — Telehealth: Payer: Self-pay | Admitting: Family Medicine

## 2008-09-25 ENCOUNTER — Ambulatory Visit: Payer: Self-pay | Admitting: Hematology and Oncology

## 2008-09-26 ENCOUNTER — Telehealth: Payer: Self-pay | Admitting: Family Medicine

## 2008-09-27 LAB — PROTIME-INR

## 2008-10-23 ENCOUNTER — Ambulatory Visit: Payer: Self-pay | Admitting: Hematology and Oncology

## 2008-11-07 ENCOUNTER — Telehealth: Payer: Self-pay | Admitting: Family Medicine

## 2008-11-08 ENCOUNTER — Ambulatory Visit: Payer: Self-pay | Admitting: Family Medicine

## 2008-11-08 DIAGNOSIS — R1011 Right upper quadrant pain: Secondary | ICD-10-CM | POA: Insufficient documentation

## 2008-11-08 DIAGNOSIS — C8590 Non-Hodgkin lymphoma, unspecified, unspecified site: Secondary | ICD-10-CM

## 2008-11-08 HISTORY — DX: Right upper quadrant pain: R10.11

## 2008-11-13 ENCOUNTER — Ambulatory Visit (HOSPITAL_BASED_OUTPATIENT_CLINIC_OR_DEPARTMENT_OTHER): Admission: RE | Admit: 2008-11-13 | Discharge: 2008-11-13 | Payer: Self-pay | Admitting: Urology

## 2008-11-22 ENCOUNTER — Ambulatory Visit: Payer: Self-pay | Admitting: Hematology and Oncology

## 2008-11-22 LAB — PROTIME-INR
INR: 2.4 (ref 2.00–3.50)
Protime: 28.8 Seconds — ABNORMAL HIGH (ref 10.6–13.4)

## 2008-12-21 ENCOUNTER — Ambulatory Visit: Payer: Self-pay | Admitting: Hematology and Oncology

## 2008-12-21 HISTORY — PX: TOTAL KNEE ARTHROPLASTY: SHX125

## 2008-12-25 ENCOUNTER — Ambulatory Visit (HOSPITAL_COMMUNITY): Admission: RE | Admit: 2008-12-25 | Discharge: 2008-12-25 | Payer: Self-pay | Admitting: Internal Medicine

## 2008-12-25 LAB — CBC WITH DIFFERENTIAL/PLATELET
Eosinophils Absolute: 0.1 10*3/uL (ref 0.0–0.5)
LYMPH%: 22.4 % (ref 14.0–49.7)
MCV: 88.9 fL (ref 79.5–101.0)
MONO%: 12.4 % (ref 0.0–14.0)
NEUT#: 2.8 10*3/uL (ref 1.5–6.5)
Platelets: 205 10*3/uL (ref 145–400)
RBC: 3.97 10*6/uL (ref 3.70–5.45)

## 2008-12-25 LAB — LACTATE DEHYDROGENASE: LDH: 148 U/L (ref 94–250)

## 2008-12-25 LAB — COMPREHENSIVE METABOLIC PANEL
Albumin: 3.7 g/dL (ref 3.5–5.2)
Alkaline Phosphatase: 60 U/L (ref 39–117)
BUN: 13 mg/dL (ref 6–23)
CO2: 28 mEq/L (ref 19–32)
Calcium: 9.2 mg/dL (ref 8.4–10.5)
Chloride: 103 mEq/L (ref 96–112)
Glucose, Bld: 122 mg/dL — ABNORMAL HIGH (ref 70–99)
Potassium: 4.3 mEq/L (ref 3.5–5.3)

## 2008-12-25 LAB — PROTIME-INR: Protime: 37.2 Seconds — ABNORMAL HIGH (ref 10.6–13.4)

## 2008-12-31 ENCOUNTER — Inpatient Hospital Stay (HOSPITAL_COMMUNITY): Admission: RE | Admit: 2008-12-31 | Discharge: 2009-01-03 | Payer: Self-pay | Admitting: Orthopedic Surgery

## 2009-01-22 ENCOUNTER — Ambulatory Visit: Payer: Self-pay | Admitting: Hematology and Oncology

## 2009-01-23 LAB — PROTIME-INR

## 2009-02-04 LAB — PROTIME-INR: Protime: 19.2 Seconds — ABNORMAL HIGH (ref 10.6–13.4)

## 2009-02-11 LAB — PROTIME-INR: Protime: 26.4 Seconds — ABNORMAL HIGH (ref 10.6–13.4)

## 2009-02-21 ENCOUNTER — Ambulatory Visit: Payer: Self-pay | Admitting: Oncology

## 2009-02-27 ENCOUNTER — Ambulatory Visit: Payer: Self-pay | Admitting: Family Medicine

## 2009-02-27 DIAGNOSIS — R0602 Shortness of breath: Secondary | ICD-10-CM

## 2009-02-27 DIAGNOSIS — E785 Hyperlipidemia, unspecified: Secondary | ICD-10-CM | POA: Insufficient documentation

## 2009-02-27 HISTORY — DX: Shortness of breath: R06.02

## 2009-02-27 HISTORY — DX: Hyperlipidemia, unspecified: E78.5

## 2009-03-01 ENCOUNTER — Encounter: Payer: Self-pay | Admitting: Family Medicine

## 2009-03-01 LAB — PROTIME-INR
INR: 1.9 — ABNORMAL LOW (ref 2.00–3.50)
Protime: 22.8 Seconds — ABNORMAL HIGH (ref 10.6–13.4)

## 2009-03-13 ENCOUNTER — Telehealth: Payer: Self-pay | Admitting: Family Medicine

## 2009-03-20 ENCOUNTER — Ambulatory Visit: Payer: Self-pay | Admitting: Family Medicine

## 2009-03-20 LAB — PROTIME-INR

## 2009-03-21 ENCOUNTER — Telehealth: Payer: Self-pay | Admitting: Family Medicine

## 2009-03-21 LAB — CONVERTED CEMR LAB
Ferritin: 22.4 ng/mL (ref 10.0–291.0)
Iron: 77 ug/dL (ref 42–145)

## 2009-03-27 ENCOUNTER — Ambulatory Visit: Payer: Self-pay | Admitting: Oncology

## 2009-03-27 LAB — PROTIME-INR: Protime: 25.2 Seconds — ABNORMAL HIGH (ref 10.6–13.4)

## 2009-04-03 ENCOUNTER — Telehealth (INDEPENDENT_AMBULATORY_CARE_PROVIDER_SITE_OTHER): Payer: Self-pay | Admitting: *Deleted

## 2009-04-04 ENCOUNTER — Ambulatory Visit: Payer: Self-pay | Admitting: Cardiology

## 2009-04-04 ENCOUNTER — Encounter (HOSPITAL_COMMUNITY): Admission: RE | Admit: 2009-04-04 | Discharge: 2009-05-27 | Payer: Self-pay | Admitting: Family Medicine

## 2009-04-04 ENCOUNTER — Ambulatory Visit: Payer: Self-pay

## 2009-04-12 ENCOUNTER — Telehealth: Payer: Self-pay | Admitting: Family Medicine

## 2009-04-23 HISTORY — PX: OTHER SURGICAL HISTORY: SHX169

## 2009-04-24 ENCOUNTER — Ambulatory Visit: Payer: Self-pay | Admitting: Hematology and Oncology

## 2009-04-24 LAB — CBC WITH DIFFERENTIAL/PLATELET
BASO%: 0.6 % (ref 0.0–2.0)
Basophils Absolute: 0 10*3/uL (ref 0.0–0.1)
EOS%: 1.9 % (ref 0.0–7.0)
HCT: 36 % (ref 34.8–46.6)
HGB: 12 g/dL (ref 11.6–15.9)
MCHC: 33.3 g/dL (ref 31.5–36.0)
MONO#: 0.7 10*3/uL (ref 0.1–0.9)
NEUT%: 57.3 % (ref 38.4–76.8)
RDW: 14.1 % (ref 11.2–14.5)
WBC: 4.4 10*3/uL (ref 3.9–10.3)
lymph#: 1.1 10*3/uL (ref 0.9–3.3)

## 2009-04-24 LAB — LACTATE DEHYDROGENASE: LDH: 144 U/L (ref 94–250)

## 2009-04-24 LAB — COMPREHENSIVE METABOLIC PANEL
ALT: 15 U/L (ref 0–35)
AST: 17 U/L (ref 0–37)
CO2: 28 mEq/L (ref 19–32)
Creatinine, Ser: 0.97 mg/dL (ref 0.40–1.20)
Sodium: 140 mEq/L (ref 135–145)
Total Bilirubin: 0.6 mg/dL (ref 0.3–1.2)
Total Protein: 6.9 g/dL (ref 6.0–8.3)

## 2009-04-24 LAB — PROTIME-INR: INR: 2.1 (ref 2.00–3.50)

## 2009-04-26 ENCOUNTER — Encounter: Payer: Self-pay | Admitting: Family Medicine

## 2009-05-01 ENCOUNTER — Ambulatory Visit: Payer: Self-pay | Admitting: Cardiology

## 2009-05-01 DIAGNOSIS — R9430 Abnormal result of cardiovascular function study, unspecified: Secondary | ICD-10-CM | POA: Insufficient documentation

## 2009-05-03 ENCOUNTER — Ambulatory Visit (HOSPITAL_COMMUNITY): Admission: RE | Admit: 2009-05-03 | Discharge: 2009-05-03 | Payer: Self-pay | Admitting: Cardiology

## 2009-05-03 ENCOUNTER — Ambulatory Visit: Payer: Self-pay | Admitting: Internal Medicine

## 2009-05-03 ENCOUNTER — Encounter: Payer: Self-pay | Admitting: Cardiology

## 2009-05-03 ENCOUNTER — Ambulatory Visit: Payer: Self-pay

## 2009-05-03 HISTORY — PX: TRANSTHORACIC ECHOCARDIOGRAM: SHX275

## 2009-05-17 ENCOUNTER — Ambulatory Visit (HOSPITAL_COMMUNITY): Admission: RE | Admit: 2009-05-17 | Discharge: 2009-05-17 | Payer: Self-pay | Admitting: Cardiology

## 2009-05-17 ENCOUNTER — Ambulatory Visit: Payer: Self-pay | Admitting: Cardiology

## 2009-05-24 ENCOUNTER — Ambulatory Visit: Payer: Self-pay | Admitting: Hematology and Oncology

## 2009-05-24 LAB — PROTIME-INR: INR: 1.9 — ABNORMAL LOW (ref 2.00–3.50)

## 2009-05-30 ENCOUNTER — Ambulatory Visit: Payer: Self-pay | Admitting: Cardiology

## 2009-05-30 DIAGNOSIS — I2510 Atherosclerotic heart disease of native coronary artery without angina pectoris: Secondary | ICD-10-CM

## 2009-05-30 HISTORY — DX: Atherosclerotic heart disease of native coronary artery without angina pectoris: I25.10

## 2009-06-17 ENCOUNTER — Ambulatory Visit: Payer: Self-pay | Admitting: Cardiology

## 2009-06-25 ENCOUNTER — Ambulatory Visit: Payer: Self-pay | Admitting: Hematology and Oncology

## 2009-06-25 LAB — CONVERTED CEMR LAB
BUN: 12 mg/dL (ref 6–23)
CO2: 29 meq/L (ref 19–32)
Chloride: 102 meq/L (ref 96–112)
Creatinine, Ser: 1 mg/dL (ref 0.4–1.2)
Glucose, Bld: 102 mg/dL — ABNORMAL HIGH (ref 70–99)
Potassium: 4.7 meq/L (ref 3.5–5.1)

## 2009-06-25 LAB — PROTIME-INR
INR: 1.7 — ABNORMAL LOW (ref 2.00–3.50)
Protime: 20.4 Seconds — ABNORMAL HIGH (ref 10.6–13.4)

## 2009-06-27 ENCOUNTER — Ambulatory Visit (HOSPITAL_BASED_OUTPATIENT_CLINIC_OR_DEPARTMENT_OTHER): Admission: RE | Admit: 2009-06-27 | Discharge: 2009-06-27 | Payer: Self-pay | Admitting: Urology

## 2009-07-02 LAB — PROTIME-INR
INR: 1.5 — ABNORMAL LOW (ref 2.00–3.50)
Protime: 18 Seconds — ABNORMAL HIGH (ref 10.6–13.4)

## 2009-07-09 ENCOUNTER — Telehealth: Payer: Self-pay | Admitting: Family Medicine

## 2009-07-09 ENCOUNTER — Encounter: Payer: Self-pay | Admitting: Family Medicine

## 2009-07-17 ENCOUNTER — Encounter: Payer: Self-pay | Admitting: Internal Medicine

## 2009-08-01 ENCOUNTER — Ambulatory Visit: Payer: Self-pay | Admitting: Family Medicine

## 2009-08-01 DIAGNOSIS — J309 Allergic rhinitis, unspecified: Secondary | ICD-10-CM | POA: Insufficient documentation

## 2009-08-01 HISTORY — DX: Allergic rhinitis, unspecified: J30.9

## 2009-08-06 ENCOUNTER — Ambulatory Visit: Payer: Self-pay | Admitting: Hematology and Oncology

## 2009-08-07 LAB — PROTIME-INR

## 2009-08-16 ENCOUNTER — Telehealth: Payer: Self-pay | Admitting: Cardiology

## 2009-09-30 ENCOUNTER — Ambulatory Visit (HOSPITAL_COMMUNITY): Admission: RE | Admit: 2009-09-30 | Discharge: 2009-09-30 | Payer: Self-pay | Admitting: Hematology and Oncology

## 2009-09-30 ENCOUNTER — Ambulatory Visit: Payer: Self-pay | Admitting: Cardiology

## 2009-09-30 ENCOUNTER — Ambulatory Visit: Payer: Self-pay | Admitting: Hematology and Oncology

## 2009-09-30 LAB — COMPREHENSIVE METABOLIC PANEL
ALT: 16 U/L (ref 0–35)
Albumin: 4.2 g/dL (ref 3.5–5.2)
CO2: 23 mEq/L (ref 19–32)
Calcium: 9.3 mg/dL (ref 8.4–10.5)
Chloride: 106 mEq/L (ref 96–112)
Potassium: 4.5 mEq/L (ref 3.5–5.3)
Sodium: 142 mEq/L (ref 135–145)
Total Protein: 6.9 g/dL (ref 6.0–8.3)

## 2009-09-30 LAB — PROTIME-INR

## 2009-09-30 LAB — CBC WITH DIFFERENTIAL/PLATELET
BASO%: 0.4 % (ref 0.0–2.0)
Basophils Absolute: 0 10*3/uL (ref 0.0–0.1)
HCT: 38.6 % (ref 34.8–46.6)
LYMPH%: 30 % (ref 14.0–49.7)
MCHC: 32.6 g/dL (ref 31.5–36.0)
MONO#: 0.4 10*3/uL (ref 0.1–0.9)
NEUT%: 59.7 % (ref 38.4–76.8)
Platelets: 180 10*3/uL (ref 145–400)
WBC: 5.1 10*3/uL (ref 3.9–10.3)

## 2009-09-30 LAB — LACTATE DEHYDROGENASE: LDH: 162 U/L (ref 94–250)

## 2009-10-01 ENCOUNTER — Ambulatory Visit: Payer: Self-pay | Admitting: Cardiology

## 2009-10-01 LAB — CONVERTED CEMR LAB
ALT: 19 units/L (ref 0–35)
AST: 21 units/L (ref 0–37)
BUN: 16 mg/dL (ref 6–23)
CO2: 26 meq/L (ref 19–32)
Chloride: 109 meq/L (ref 96–112)
Cholesterol: 143 mg/dL (ref 0–200)
Creatinine, Ser: 1.2 mg/dL (ref 0.4–1.2)
LDL Cholesterol: 69 mg/dL (ref 0–99)
Potassium: 4.3 meq/L (ref 3.5–5.1)
Pro B Natriuretic peptide (BNP): 20.8 pg/mL (ref 0.0–100.0)
Total Protein: 6.4 g/dL (ref 6.0–8.3)
Triglycerides: 137 mg/dL (ref 0.0–149.0)

## 2009-10-02 ENCOUNTER — Encounter: Payer: Self-pay | Admitting: Family Medicine

## 2009-11-12 ENCOUNTER — Ambulatory Visit (HOSPITAL_BASED_OUTPATIENT_CLINIC_OR_DEPARTMENT_OTHER): Admission: RE | Admit: 2009-11-12 | Discharge: 2009-11-12 | Payer: Self-pay | Admitting: Urology

## 2009-11-20 ENCOUNTER — Ambulatory Visit: Payer: Self-pay | Admitting: Hematology and Oncology

## 2009-11-22 LAB — PROTIME-INR
INR: 2 (ref 2.00–3.50)
Protime: 24 Seconds — ABNORMAL HIGH (ref 10.6–13.4)

## 2009-12-16 ENCOUNTER — Telehealth: Payer: Self-pay | Admitting: Family Medicine

## 2009-12-20 ENCOUNTER — Ambulatory Visit: Payer: Self-pay | Admitting: Hematology and Oncology

## 2010-01-02 IMAGING — CT CT ABDOMEN W/ CM
3 of 8 series · 16 of 46 positions shown, 18 images · IV contrast (agent unspecified)
Comparison: 03/29/2007

CT NECK

CLINICAL DATA: Lymphoma

CT NECK, CHEST, ABDOMEN AND PELVIS WITH CONTRAST
TECHNIQUE: Multidetector CT imaging of the neck, chest, abdomen,
and pelvis was performed using the standard protocol after the
bolus administration of intravenous contrast.
Contrast: 125 ml [id number]

[Series 2: cap 5.0 b40f · axial · 0.79mm/px · z∈[-756,-246]mm · 11 of 124 slices shown, 13 images]
[im 11/124  soft-tissue]
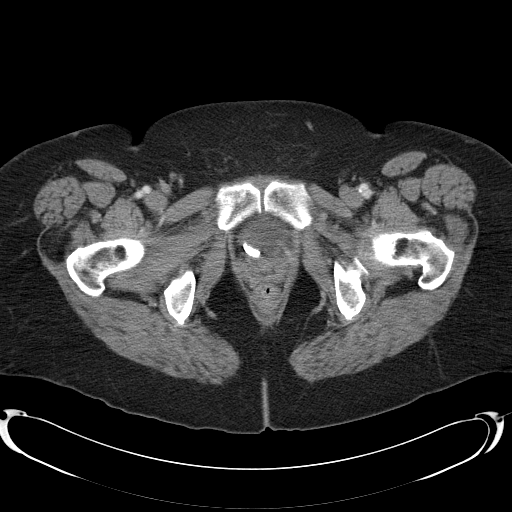
[im 11/124  bone]
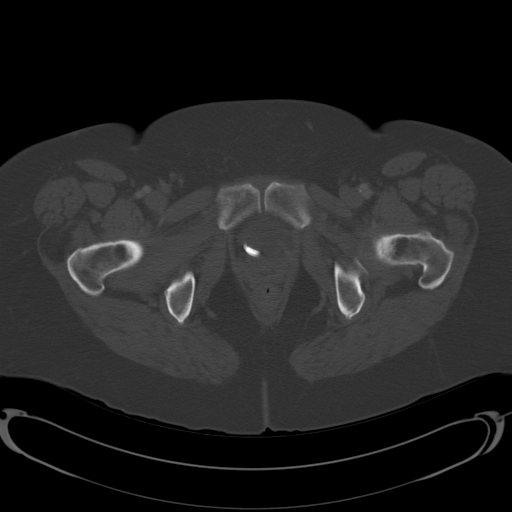
[im 21/124  soft-tissue]
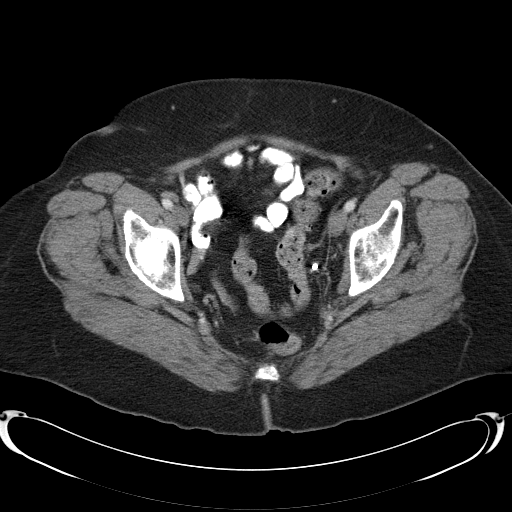
[im 31/124  soft-tissue]
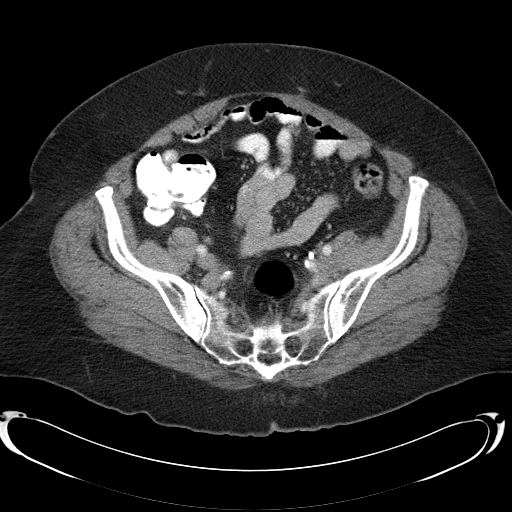
[im 42/124  soft-tissue]
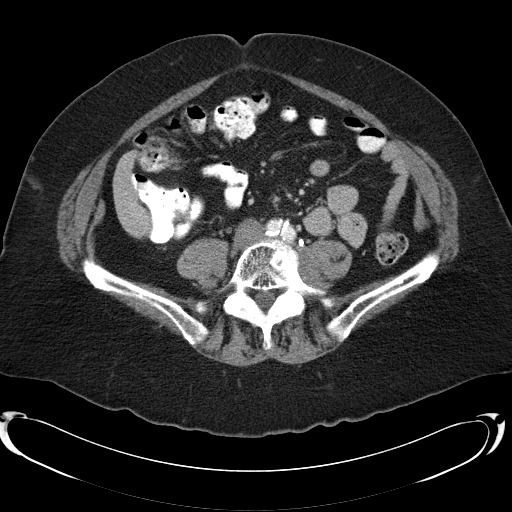
[im 52/124  soft-tissue]
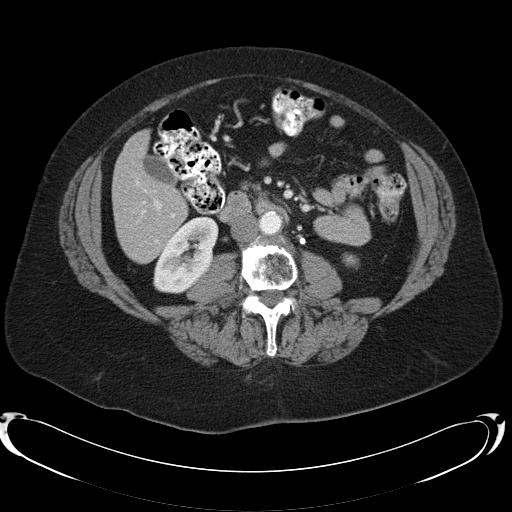
[im 62/124  soft-tissue]
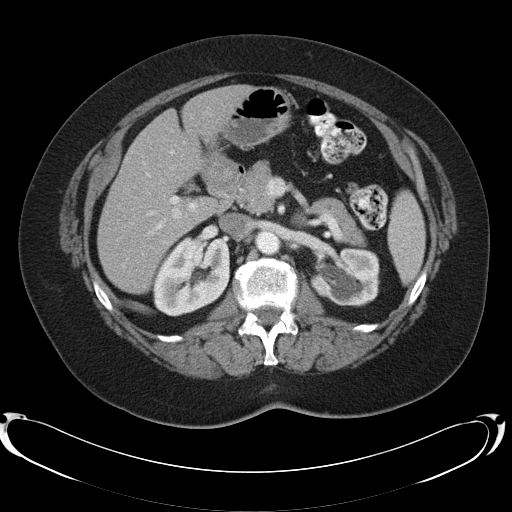
[im 72/124  soft-tissue]
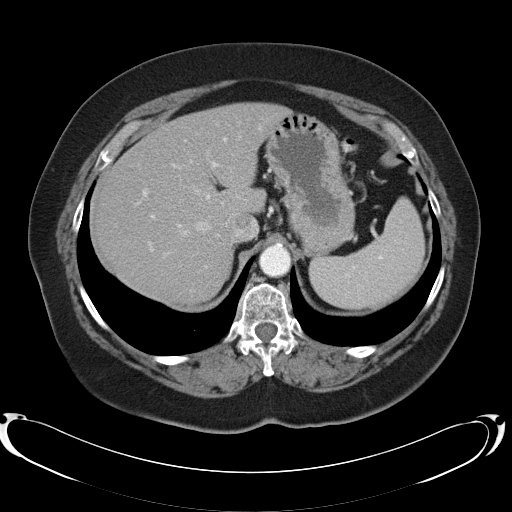
[im 83/124  soft-tissue]
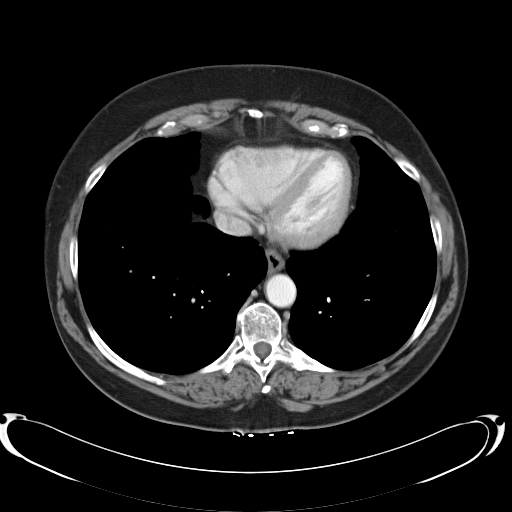
[im 93/124  soft-tissue]
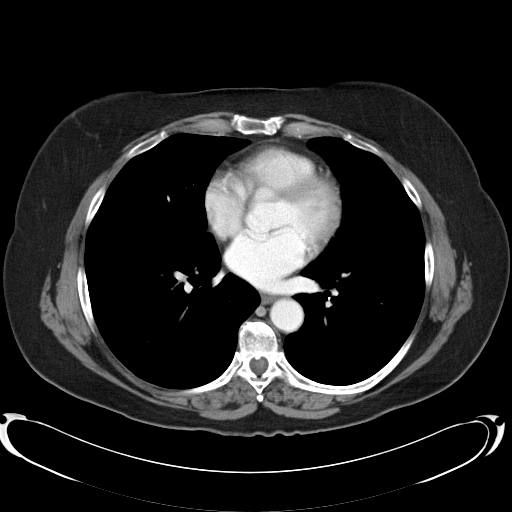
[im 93/124  bone]
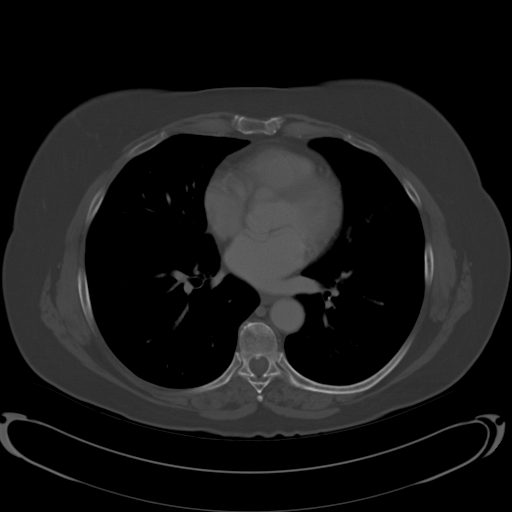
[im 103/124  soft-tissue]
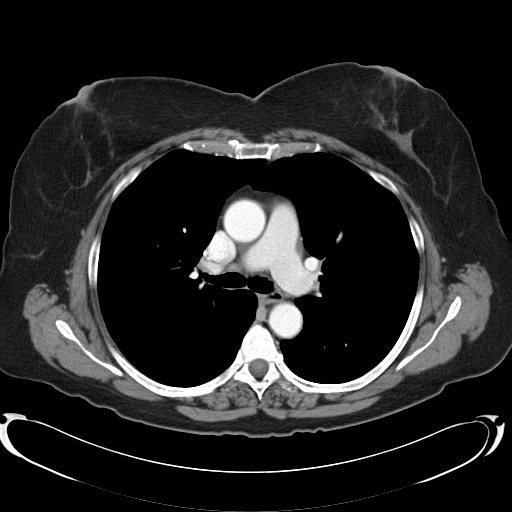
[im 113/124  soft-tissue]
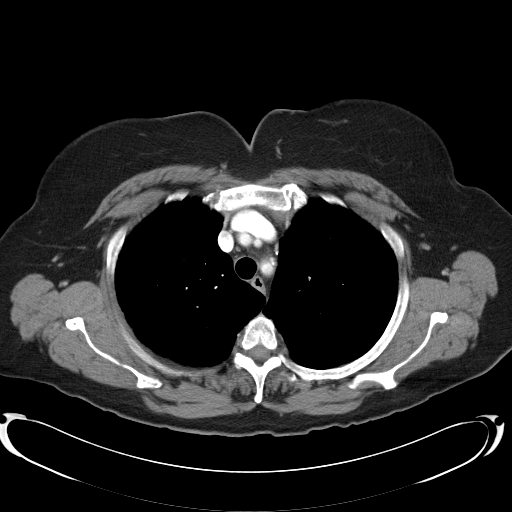

[Series 3: neck 3.0 b40s · axial · 0.39mm/px · z∈[-238,-204]mm · 2 of 90 slices shown]
[im 12/90  soft-tissue]
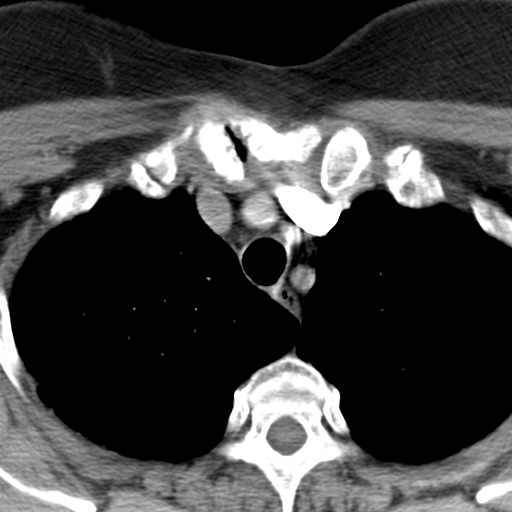
[im 23/90  soft-tissue]
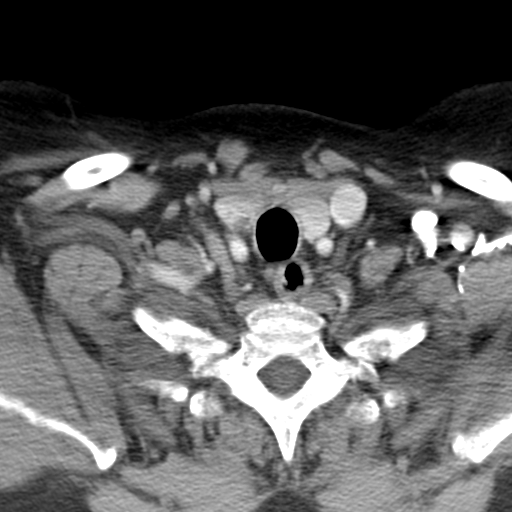

[Series 604: <mpr thick range(2)> · coronal · 1.21mm/px · 3 of 101 slices shown]
[im 21/101  soft-tissue]
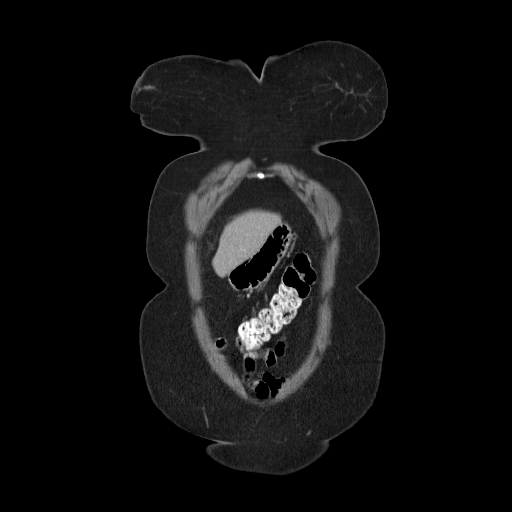
[im 41/101  soft-tissue]
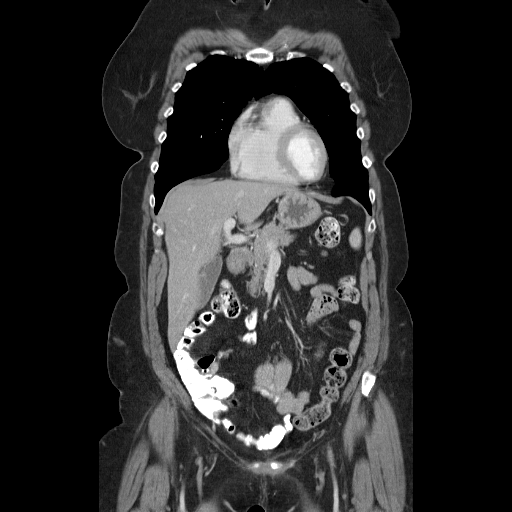
[im 61/101  soft-tissue]
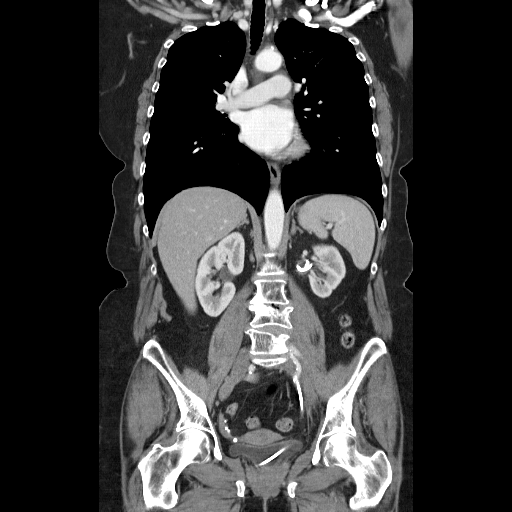

[16 of 46 positions shown; findings below may reference images not displayed]

FINDINGS: No evidence for lymphadenopathy in the neck.  The
parotid and sublingual glands are symmetric.  Major arterial and
venous anatomy of the neck opacifies normally.  Atherosclerotic
calcification is seen in the carotid bulb bilaterally.

Thyroid gland is unremarkable.

Bone windows show some facet osteoarthritis in the cervical spine
but no suspicious lytic or sclerotic lesion is evident.
IMPRESSION: Stable exam.  No evidence for lymphadenopathy in the neck.

CT CHEST
FINDINGS: There is no axillary, mediastinal, or hilar
lymphadenopathy.  The heart size is normal.  There is no
pericardial or pleural effusion.

Lung windows are without parenchymal nodule or mass.  There is no
focal airspace consolidation.

Bone windows are without suspicious lytic or sclerotic osseous
lesions.  There is some sclerosis in the medial right clavicle, but
degenerative changes are seen in the right sternoclavicular joint
and there is similar sclerotic and cystic change in the right
sternum.  Imaging features are most compatible with degenerative
change.  These findings are stable.
IMPRESSION: No evidence for lymphadenopathy in the chest.

Degenerative changes at the right sternoclavicular joint.

CT ABDOMEN
FINDINGS: No focal abnormality is seen in the liver or spleen.  The
stomach, duodenum, pancreas, gallbladder, adrenal glands, and right
kidney are normal.  Scarring is seen in the left kidney which is
atrophic.  A left-sided renal cyst is stable .  Proximal loop of a
left double-J internal ureteral stent remains formed within the
left renal pelvis.

The patient has some subtle, abnormal soft tissue attenuation
between the aorta and the lumbar spine, unchanged.

Bone windows show no suspicious lytic or sclerotic osseous lesions.
IMPRESSION: Stable appearance of the mild retroperitoneal soft tissue
attenuation without a discrete lymph node evident.  These changes
may well reflect treated lymphoma.

CT PELVIS
FINDINGS: No intraperitoneal free fluid.  No pelvic
lymphadenopathy.  There is some soft tissue attenuation along the
left sacrum, between the common iliac artery and the bony cortex.
This is unchanged in the interval.

Uterus is unremarkable.  There is no adnexal mass.  Diverticular
changes are seen in the sigmoid colon without diverticulitis.
Terminal ileum is normal.  The appendix is normal.

Bone windows show degenerative disc and endplate changes around the
L3-4 interspace.  Prominent superior endplate Schmorl's node versus
central endplate compression injury at L5 has stable appearance.
IMPRESSION: Stable CT scan of the pelvis.  No evidence for lymphadenopathy.

## 2010-01-14 LAB — PROTIME-INR: INR: 3.1 (ref 2.00–3.50)

## 2010-02-14 ENCOUNTER — Ambulatory Visit: Payer: Self-pay | Admitting: Hematology and Oncology

## 2010-02-18 LAB — PROTIME-INR

## 2010-02-19 ENCOUNTER — Telehealth: Payer: Self-pay | Admitting: Cardiology

## 2010-03-20 ENCOUNTER — Ambulatory Visit: Payer: Self-pay | Admitting: Hematology and Oncology

## 2010-03-25 LAB — COMPREHENSIVE METABOLIC PANEL
ALT: 10 U/L (ref 0–35)
AST: 12 U/L (ref 0–37)
Albumin: 4 g/dL (ref 3.5–5.2)
Alkaline Phosphatase: 64 U/L (ref 39–117)
BUN: 15 mg/dL (ref 6–23)
CO2: 25 mEq/L (ref 19–32)
Calcium: 9 mg/dL (ref 8.4–10.5)
Chloride: 105 mEq/L (ref 96–112)
Creatinine, Ser: 1.11 mg/dL (ref 0.40–1.20)
Glucose, Bld: 92 mg/dL (ref 70–99)
Potassium: 4.6 mEq/L (ref 3.5–5.3)
Sodium: 140 mEq/L (ref 135–145)
Total Bilirubin: 0.4 mg/dL (ref 0.3–1.2)
Total Protein: 6.4 g/dL (ref 6.0–8.3)

## 2010-03-25 LAB — CBC WITH DIFFERENTIAL/PLATELET
BASO%: 0.4 % (ref 0.0–2.0)
EOS%: 2.2 % (ref 0.0–7.0)
HCT: 34.6 % — ABNORMAL LOW (ref 34.8–46.6)
LYMPH%: 33.9 % (ref 14.0–49.7)
MCH: 30.3 pg (ref 25.1–34.0)
MCHC: 34.3 g/dL (ref 31.5–36.0)
NEUT%: 51.8 % (ref 38.4–76.8)
lymph#: 1.5 10*3/uL (ref 0.9–3.3)

## 2010-03-25 LAB — PROTIME-INR: INR: 2 (ref 2.00–3.50)

## 2010-03-25 LAB — LACTATE DEHYDROGENASE: LDH: 138 U/L (ref 94–250)

## 2010-03-26 ENCOUNTER — Telehealth: Payer: Self-pay | Admitting: Family Medicine

## 2010-04-07 ENCOUNTER — Ambulatory Visit
Admission: RE | Admit: 2010-04-07 | Discharge: 2010-04-07 | Payer: Self-pay | Source: Home / Self Care | Attending: Family Medicine | Admitting: Family Medicine

## 2010-04-07 DIAGNOSIS — M1990 Unspecified osteoarthritis, unspecified site: Secondary | ICD-10-CM | POA: Insufficient documentation

## 2010-04-07 HISTORY — DX: Unspecified osteoarthritis, unspecified site: M19.90

## 2010-04-09 ENCOUNTER — Other Ambulatory Visit: Payer: Self-pay | Admitting: Cardiology

## 2010-04-09 ENCOUNTER — Ambulatory Visit: Admission: RE | Admit: 2010-04-09 | Discharge: 2010-04-09 | Payer: Self-pay | Source: Home / Self Care

## 2010-04-09 LAB — LIPID PANEL
Cholesterol: 123 mg/dL (ref 0–200)
HDL: 45.6 mg/dL (ref >39.00)
LDL Cholesterol: 59 mg/dL (ref 0–99)
Total CHOL/HDL Ratio: 3
Triglycerides: 90 mg/dL (ref 0.0–149.0)
VLDL: 18 mg/dL (ref 0.0–40.0)

## 2010-04-09 LAB — BASIC METABOLIC PANEL
BUN: 15 mg/dL (ref 6–23)
CO2: 29 mEq/L (ref 19–32)
Calcium: 9.2 mg/dL (ref 8.4–10.5)
Chloride: 107 mEq/L (ref 96–112)
Creatinine, Ser: 1 mg/dL (ref 0.4–1.2)
GFR: 56.34 mL/min — ABNORMAL LOW (ref >60.00)
Glucose, Bld: 121 mg/dL — ABNORMAL HIGH (ref 70–99)
Potassium: 5.2 mEq/L — ABNORMAL HIGH (ref 3.5–5.1)
Sodium: 141 mEq/L (ref 135–145)

## 2010-04-09 LAB — HEPATIC FUNCTION PANEL
ALT: 15 U/L (ref 0–35)
AST: 16 U/L (ref 0–37)
Albumin: 3.5 g/dL (ref 3.5–5.2)
Alkaline Phosphatase: 58 U/L (ref 39–117)
Bilirubin, Direct: 0.1 mg/dL (ref 0.0–0.3)
Total Bilirubin: 0.4 mg/dL (ref 0.3–1.2)
Total Protein: 6.2 g/dL (ref 6.0–8.3)

## 2010-04-11 ENCOUNTER — Ambulatory Visit: Admission: RE | Admit: 2010-04-11 | Discharge: 2010-04-11 | Payer: Self-pay | Source: Home / Self Care

## 2010-04-11 ENCOUNTER — Encounter: Payer: Self-pay | Admitting: Cardiology

## 2010-04-12 ENCOUNTER — Encounter: Payer: Self-pay | Admitting: Urology

## 2010-04-12 ENCOUNTER — Other Ambulatory Visit: Payer: Self-pay | Admitting: Hematology and Oncology

## 2010-04-12 ENCOUNTER — Encounter: Payer: Self-pay | Admitting: Hematology and Oncology

## 2010-04-12 DIAGNOSIS — C8590 Non-Hodgkin lymphoma, unspecified, unspecified site: Secondary | ICD-10-CM

## 2010-04-13 ENCOUNTER — Encounter: Payer: Self-pay | Admitting: Hematology and Oncology

## 2010-04-14 ENCOUNTER — Ambulatory Visit: Admit: 2010-04-14 | Payer: Self-pay

## 2010-04-20 LAB — CONVERTED CEMR LAB
ALT: 15 units/L (ref 0–35)
AST: 20 units/L (ref 0–37)
Alkaline Phosphatase: 67 units/L (ref 39–117)
Basophils Absolute: 0 10*3/uL (ref 0.0–0.1)
Bilirubin, Direct: 0 mg/dL (ref 0.0–0.3)
CO2: 28 meq/L (ref 19–32)
Calcium: 9.2 mg/dL (ref 8.4–10.5)
Chloride: 102 meq/L (ref 96–112)
Creatinine, Ser: 1.2 mg/dL (ref 0.4–1.2)
Eosinophils Relative: 3 % (ref 0.0–5.0)
GFR calc non Af Amer: 47.93 mL/min (ref >60)
Glucose, Bld: 114 mg/dL — ABNORMAL HIGH (ref 70–99)
Glucose, Bld: 96 mg/dL (ref 70–99)
HCT: 34.5 % — ABNORMAL LOW (ref 36.0–46.0)
HDL: 46.4 mg/dL (ref >39.00)
Hemoglobin: 11.6 g/dL — ABNORMAL LOW (ref 12.0–15.0)
Lymphs Abs: 1.1 10*3/uL (ref 0.7–4.0)
MCV: 92.8 fL (ref 78.0–100.0)
Monocytes Absolute: 0.4 10*3/uL (ref 0.1–1.0)
Neutro Abs: 2.5 10*3/uL (ref 1.4–7.7)
Platelets: 219 10*3/uL (ref 150.0–400.0)
Potassium: 4.7 meq/L (ref 3.5–5.1)
RDW: 14.7 % — ABNORMAL HIGH (ref 11.5–14.6)
Sodium: 138 meq/L (ref 135–145)
Sodium: 144 meq/L (ref 135–145)
Total Bilirubin: 0.6 mg/dL (ref 0.3–1.2)

## 2010-04-22 ENCOUNTER — Ambulatory Visit: Payer: Self-pay | Admitting: Hematology and Oncology

## 2010-04-22 LAB — PROTIME-INR

## 2010-04-22 NOTE — Letter (Signed)
Summary: [facility]  [facility]   Imported By: Maryln Gottron 07/25/2009 13:09:33  _____________________________________________________________________  External Attachment:    Type:   Image     Comment:   External Document

## 2010-04-22 NOTE — Assessment & Plan Note (Signed)
Summary: 3WK F/U SL  Medications Added OXYCODONE-ACETAMINOPHEN 5-325 MG TABS (OXYCODONE-ACETAMINOPHEN) once or twice a day for shoulder pain LISINOPRIL 5 MG TABS (LISINOPRIL) take one tablet by mouth once daily CRESTOR 5 MG TABS (ROSUVASTATIN CALCIUM) take one tablet by mouth once daily in the evening NITROSTAT 0.4 MG SUBL (NITROGLYCERIN) Place tablet under tongue.  Take tablets 5 minutes apart, up to 3 tablets. ASPIRIN 81 MG TBEC (ASPIRIN) Take one tablet by mouth daily      Allergies Added:   Primary Provider:  Dr. Evelena Peat   History of Present Illness: 65 yo with history of retroperitoneal fibrosis and non-Hodgkin's lymphoma as well as prior DVT presented initially for evaluation of an abnormal myoview.  Patient had extreme fatigue for months.  She has thought that this was due to depression.  She has had no chest pain.  She does not get short of breath with her usual activities (housework, going up steps).  She gets a little short of breath if she walks up a hill. Given her fatigue, a Lexiscan myoview was done.  This was an equivocal study due to subdiaphragmatic nuclear uptake.  EF was calculated to be 49% with global hypokinesis.    Given the equivocal myoview, I had her do a coronary CT angiogram.  This showed a moderate calcium score of 161 (putting her at a high risk percentile for her age) and a possible moderate mid LAD stenosis (though some degradation of images by respiratory artifact.  Echo was done to reassess LV systolic function (reported as mildly decreased on myoview).  This confirmed mild LV systolic dysfunction, EF 45-50% with moderate diastolic dysfunction.    Since last appointment, patient states that her fatigue has improved (less stress, weather better).  She does, however, continue to be having a difficult time with her husband.  She says she wants to divorce him.   Labs (12/10): HCT 34.5, K 4.3, creatinine 1.2, LDL 101, HDL 46  ECG: NSR, normal  Current  Medications (verified): 1)  Coumadin 2.5 Mg Tabs (Warfarin Sodium) .... As Directed 2)  Alprazolam 1 Mg Tabs (Alprazolam) .... One Tab Two Times A Day 3)  Wellbutrin Xl 300 Mg Xr24h-Tab (Bupropion Hcl) .... Once Daily 4)  Citalopram Hydrobromide 40 Mg Tabs (Citalopram Hydrobromide) .... One By Mouth Once Daily 5)  Oxycodone-Acetaminophen 5-325 Mg Tabs (Oxycodone-Acetaminophen) .... Once or Twice A Day For Shoulder Pain  Allergies (verified): 1)  ! Penicillin V Potassium (Penicillin V Potassium)  Past History:  Past Medical History: 1. Anxiety Disorder 2. Arthritis 3. Depression 4. Retroperitoneal fibrosis with non-Hodgkin's lymphoma.  This was treated with chemotherapy in 2006 and is in remission.  5. Adenomatous Colon Polyps 6. Diverticulosis 7. DVT left leg/thigh: in both 2005 and 2006, associated with lymphoma.  She is on chronic coumadin.  8. Obesity 9. Left ureteral stent (ureter involved by retroperitoneal fibrosis).  10. CAD: Lexiscan myoview (1/11): EF 49% with global hypokinesis.  There appeared to be a mild anterior perfusion defect that could represent ischemia.  However, there was significant subdiaphragmatic nuclear activity that was worse in stress than rest so it is possible that the defect was artifactual.  This study was equivocal.  Coronary CT angiogram was done to follow this up in 2/11.  This showed calcium score of 161 (86th percentile for her age and race) and a possible moderate mid LAD stenosis.   11. Mild systolic dysfunction: EF 49% on myoview.  Echo (2/11): EF 45-50%, moderate diastolic dysfunction, mild MR,  mild biatrial enlargement, PASP 34 mmHg.   Family History: Reviewed history from 05/01/2009 and no changes required. Family History of Arthritis mother Family History Diabetes 1st degree relative  deceased sister Family History Hypertension deceased sister Family History of Stroke F 1st degree relative <60  deceased sister Family History of Cardiovascular  disorder  mother  Sister had MI and CVA at 40 Mother with CABG in her 25s  Social History: Retired Married but having trouble with her husband.  Quit smoking 5/10 Alcohol use-yes, rarely Regular exercise-no  Review of Systems       All systems reviewed and negative except as per HPI.   Vital Signs:  Patient profile:   65 year old female Menstrual status:  postmenopausal Height:      67 inches Weight:      244 pounds BMI:     38.35 Pulse rate:   75 / minute Pulse rhythm:   regular BP sitting:   136 / 88  (left arm) Cuff size:   large  Vitals Entered By: Judithe Modest CMA (May 30, 2009 11:08 AM)  Physical Exam  General:  Well developed, well nourished, in no acute distress. Obese.  Neck:  Neck supple, no JVD. No masses, thyromegaly or abnormal cervical nodes. Lungs:  Clear bilaterally to auscultation and percussion. Heart:  Non-displaced PMI, chest non-tender; regular rate and rhythm, S1, S2 without murmurs, rubs or gallops. Carotid upstroke normal, no bruit.  Pedals normal pulses. No edema, no varicosities. Abdomen:  Bowel sounds positive; abdomen soft and non-tender without masses, organomegaly, or hernias noted. No hepatosplenomegaly. Extremities:  No clubbing or cyanosis. Neurologic:  Alert and oriented x 3. Psych:  Normal affect.   Impression & Recommendations:  Problem # 1:  CAD, NATIVE VESSEL (ICD-414.01) Patient has coronary disease, with a high calcium score percentile and a possible moderate stenosis in the mid LAD.  Patient is really essentially asymptomatic at this point from a cardiovascular standpoint.  She does get a little short of breath with heavy exertion but has not had any chest pain or unusual dyspnea.  Her fatigue seems to have mostly resolved and she attributes it mainly to stress.  We talked at length about a possible catheterization, which I would do from her right radial artery. Given her lack of symptoms and desire to avoid an invasive  procedure, we opted for medical management.  I feel that this is a very reasonable choice given her lack of symptoms as we do not have data that would support intervention on an essentially asymptomatic patient.  I will plan on aggressive medical management and close followup.  - Start ASA 81 mg daily and a statin (Crestor 5 mg daily).  - Hold off on beta blockade given significant depression.  - Start lisinopril 5 mg by mouth qday with BMET in 2 wks.  - Followup in 3 months.   Problem # 2:  DYSLIPIDEMIA (ICD-272.4) Beginning Crestor, check lipids/LFTs in 3 months.   Problem # 3:  MILD LV SYSTOLIC DYSFUNCTION EF 45-50% by echo and myoview.  Interestingly, this appears to be more of a global hypokinesis, suggesting a nonischemic etiology.  As above, will start an ACEI but hold off on beta blocker for now given significant depression.    Patient Instructions: 1)  Your physician recommends that you schedule a follow-up appointment in: 3 months with Dr. Shirlee Latch. 2)  Your physician recommends that you return for lab work in: 2 weeks for BMP and again in 3 months  prior to your appointment with Dr. Shirlee Latch. 3)  Your physician has recommended you make the following change in your medication: start Lisinopril 5 mg daily, Crestor 5 mg daily, Nitroglycerin sublingual as needed for chest pain or tightness (all of these were electronically ordered & sent to [facility] on [address] in [place]) and Aspirin 81 mg daily which can be purchased over the counter. Prescriptions: NITROSTAT 0.4 MG SUBL (NITROGLYCERIN) Place tablet under tongue.  Take tablets 5 minutes apart, up to 3 tablets.  #25 x 1   Entered by:   Minerva Areola, RN, BSN   Authorized by:   Marca Ancona, MD   Signed by:   Minerva Areola, RN, BSN on 05/30/2009   Method used:   Electronically to        [facility] [phone]* (retail)       [address] [place], [place]  [address]       Ph: [phone]       Fax: [phone]   RxID:    [id number] CRESTOR 5 MG TABS (ROSUVASTATIN CALCIUM) take one tablet by mouth once daily in the evening  #90 x 4   Entered by:   Minerva Areola, RN, BSN   Authorized by:   Marca Ancona, MD   Signed by:   Minerva Areola, RN, BSN on 05/30/2009   Method used:   Electronically to        [facility] [phone]* (retail)       [address] [place], [place]  [address]       Ph: [phone]       Fax: [phone]   RxID:   [id number] LISINOPRIL 5 MG TABS (LISINOPRIL) take one tablet by mouth once daily  #90 x 4   Entered by:   Minerva Areola, RN, BSN   Authorized by:   Marca Ancona, MD   Signed by:   Minerva Areola, RN, BSN on 05/30/2009   Method used:   Electronically to        [facility] [phone]* (retail)       [address] [place], [place]  [address]       Ph: [phone]       Fax: [phone]   RxID:   [id number]

## 2010-04-22 NOTE — Assessment & Plan Note (Signed)
Summary: Cardiology Nuckear Study  Nuclear Med Background Indications for Stress Test: Evaluation for Ischemia    History Comments: NO DOCUMENTED CAD  Symptoms: DOE, Fatigue, Fatigue with Exertion, Palpitations    Nuclear Pre-Procedure Cardiac Risk Factors: Family History - CAD, History of Smoking, Obesity Caffeine/Decaff Intake: None NPO After: 8:30 PM Lungs: Clear IV 0.9% NS with Angio Cath: 22g     IV Site: (R) Hand IV Started by: Stanton Kidney EMT-P Chest Size (in) 42     Cup Size DD     Height (in): 67 Weight (lb): 241 BMI: 37.88  Nuclear Med Study 1 or 2 day study:  1 day     Stress Test Type:  Eugenie Birks Reading MD:  Marca Ancona, MD     Referring MD:  Evelena Peat, MD Resting Radionuclide:  Technetium 34m Tetrofosmin      Stress Radionuclide:  Technetium 63m Tetrofosmin       Stress Protocol   Lexiscan: 0.4 mg   Stress Test Technologist:  Rea College CMA-N Rest Procedure  Myocardial perfusion imaging was performed at rest 45 minutes following the intravenous administration of Myoview Technetium 72m Tetrofosmin.  Stress Procedure  The patient received IV Lexiscan 0.4 mg over 15-seconds.  Myoview injected at 30-seconds.  There were no significant changes with infusion.  Quantitative spect images were obtained after a 45 minute delay.  QPS Raw Data Images:  Interference from nuclear activity from structures below the diaphragm.  Stress Images:  Mild perfusion defect in the anterior wall.  Rest Images:  Normal homogeneous uptake in all areas of the myocardium. Subtraction (SDS):  Possible anterior ischemia.    Quantitative Gated Spect Images QGS EF:  49 % QGS cine images:  Mild global hypokinesis.    Overall Impression  Exercise Capacity: Lexiscan study.  BP Response: Normal blood pressure response. Clinical Symptoms: Abdominal discomfort.  ECG Impression: No significant ST segment change suggestive of ischemia. Overall Impression: Images are suggestive  of possible anterior ischemia with mild global hypokinesis.  Overall Impression Comments: Equivocal study.  Suggestion of anterior ischemia, but subdiaphragmatic nuclear activity is more prominent in stress than rest and may add counts to the inferior wall, making anterior wall appear relatively count poor.   Appended Document: Orders Update  I have finally been able to get in touch with pt. Equivocal Lexiscan.  Pt symptomatically stable but has nonspecific sxs of dyspnea and increasing fatigue with activity.  She agrees to cardiology referral to further assess and we will set up.   Clinical Lists Changes  Orders: Added new Referral order of Cardiology Referral (Cardiology) - Signed

## 2010-04-22 NOTE — Letter (Signed)
[facility]   Imported By: Maryln Gottron 10/17/2009 14:18:07  _____________________________________________________________________  External Attachment:    Type:   Image     Comment:   External Document

## 2010-04-22 NOTE — Assessment & Plan Note (Signed)
Summary: per check out/sf      Allergies Added:   Primary Provider:  Dr. Evelena Peat   History of Present Illness: 65 yo with history of retroperitoneal fibrosis and non-Hodgkin's lymphoma as well as prior DVT presented initially for evaluation of an abnormal myoview.  Patient had extreme fatigue for months.  She has thought that this was due to depression.  She has had no chest pain.  She does not get short of breath with her usual activities (housework, going up steps).  She gets a little short of breath if she walks up a hill. Given her fatigue, a Lexiscan myoview was done.  This was an equivocal study due to subdiaphragmatic nuclear uptake.  EF was calculated to be 49% with global hypokinesis.    Given the equivocal myoview, I had her do a coronary CT angiogram.  This showed a moderate calcium score of 161 (putting her at a high risk percentile for her age) and a possible moderate mid LAD stenosis (though some degradation of images by respiratory artifact).  Echo was done to reassess LV systolic function (reported as mildly decreased on myoview).  This confirmed mild LV systolic dysfunction, EF 45-50% with moderate diastolic dysfunction.    Patient continues to do well symptomatically.  No chest pain or exertional dyspnea.  She is tolerating Livalo with no muscle pains.  Unfortunately, her weight is up about 7 lbs.   Labs (12/10): HCT 34.5, K 4.3, creatinine 1.2, LDL 101, HDL 46 Labs (7/11): LFTs normal, LDL 69, HDL 47, BNP 21, K 4.3, creatinine 1.2   Current Medications (verified): 1)  Coumadin 2.5 Mg Tabs (Warfarin Sodium) .... As Directed 2)  Alprazolam 1 Mg Tabs (Alprazolam) .... One Tab Two Times A Day 3)  Wellbutrin Xl 300 Mg Xr24h-Tab (Bupropion Hcl) .... Once Daily 4)  Citalopram Hydrobromide 40 Mg Tabs (Citalopram Hydrobromide) .... One By Mouth Once Daily 5)  Oxycodone-Acetaminophen 5-325 Mg Tabs (Oxycodone-Acetaminophen) .... Once or Twice A Day For Shoulder Pain 6)   Lisinopril 5 Mg Tabs (Lisinopril) .... Take One Tablet By Mouth Once Daily 7)  Livalo 2 Mg Tabs (Pitavastatin Calcium) .... One Tablet Daily 8)  Nitrostat 0.4 Mg Subl (Nitroglycerin) .... Place Tablet Under Tongue.  Take Tablets 5 Minutes Apart, Up To 3 Tablets. 9)  Aspirin 81 Mg Tbec (Aspirin) .... Take One Tablet By Mouth Daily 10)  Coenzyme Q10 100 Mg Caps (Coenzyme Q10) .... One Tablet  Allergies (verified): 1)  ! Penicillin V Potassium (Penicillin V Potassium)  Past History:  Past Medical History: 1. Anxiety Disorder 2. Arthritis 3. Depression 4. Retroperitoneal fibrosis with non-Hodgkin's lymphoma.  This was treated with chemotherapy in 2006 and is in remission.  5. Adenomatous Colon Polyps 6. Diverticulosis 7. DVT left leg/thigh: in both 2005 and 2006, associated with lymphoma.  She is on chronic coumadin.  8. Obesity 9. Left ureteral stent (ureter involved by retroperitoneal fibrosis).  10. CAD: Lexiscan myoview (1/11): EF 49% with global hypokinesis.  There appeared to be a mild anterior perfusion defect that could represent ischemia.  However, there was significant subdiaphragmatic nuclear activity that was worse in stress than rest so it is possible that the defect was artifactual.  This study was equivocal.  Coronary CT angiogram was done to follow this up in 2/11.  This showed calcium score of 161 (86th percentile for her age and race) and a possible moderate mid LAD stenosis.   11. Mild systolic dysfunction: EF 49% on myoview.  Echo (2/11):  EF 45-50%, moderate diastolic dysfunction, mild MR, mild biatrial enlargement, PASP 34 mmHg.  12. Hyperlipidemia: myalgias with Crestor.   Family History: Reviewed history from 05/01/2009 and no changes required. Family History of Arthritis mother Family History Diabetes 1st degree relative  deceased sister Family History Hypertension deceased sister Family History of Stroke F 1st degree relative <60  deceased sister Family History of  Cardiovascular disorder  mother  Sister had MI and CVA at 86 Mother with CABG in her 28s  Social History: Reviewed history from 05/30/2009 and no changes required. Retired Married  Quit smoking 5/10 Alcohol use-yes, rarely Regular exercise-no  Vital Signs:  Patient profile:   65 year old female Menstrual status:  postmenopausal Height:      67 inches Weight:      251 pounds BMI:     39.45 Pulse rate:   76 / minute BP supine:   112 / 70 Cuff size:   large  Vitals Entered By: Judithe Modest CMA (October 01, 2009 11:08 AM)  Physical Exam  General:  Well developed, well nourished, in no acute distress. Obese.  Neck:  Neck supple, no JVD. No masses, thyromegaly or abnormal cervical nodes. Lungs:  Clear bilaterally to auscultation and percussion. Heart:  Non-displaced PMI, chest non-tender; regular rate and rhythm, S1, S2 without murmurs, rubs or gallops. Carotid upstroke normal, no bruit.  Pedals normal pulses. No edema, no varicosities. Abdomen:  Bowel sounds positive; abdomen soft and non-tender without masses, organomegaly, or hernias noted. No hepatosplenomegaly. Extremities:  No clubbing or cyanosis. Neurologic:  Alert and oriented x 3. Psych:  Normal affect.   Impression & Recommendations:  Problem # 1:  CAD, NATIVE VESSEL (ICD-414.01) Patient has coronary disease, with a high calcium score percentile and a possible moderate stenosis in the mid LAD.  Patient is really essentially asymptomatic at this point from a cardiovascular standpoint.  She does get a little short of breath with heavy exertion but has not had any chest pain or unusual dyspnea.  Given her lack of symptoms and desire to avoid an invasive procedure, we opted for medical management.  I feel that this is a very reasonable choice given her lack of symptoms as we do not have data that would support intervention on an essentially asymptomatic patient.  I will continue on aggressive medical management and close  followup.  - Continue ASA, statin, lisinopril.   - She will call us if she develops any chest pain or severe exertional dyspnea.  If needed, we could proceed with cath throught the right radial artery.   Problem # 2:  DYSLIPIDEMIA (ICD-272.4) Patient has been able to tolerate Livalo, and LDL is at goal, < 70.

## 2010-04-22 NOTE — Letter (Signed)
[facility]   Imported By: Maryln Gottron 05/15/2009 11:04:46  _____________________________________________________________________  External Attachment:    Type:   Image     Comment:   External Document

## 2010-04-22 NOTE — Progress Notes (Signed)
Summary: [facility] 07/18/2009  Phone Note From Other Clinic   Caller: Dr. Hardie Pulley Call For: Dr Caryl Never Summary of Call: Dr. Eusebio Friendly would like Dr. Caryl Never take over pt's Coumadin instructions??? Call Jason Fila at [phone] Initial call taken by: Lynann Beaver CMA,  July 09, 2009 2:43 PM  Follow-up for Phone Call        We will be happy to take over.  Need to clarify her currrent dose and need to know when checked last. Follow-up by: Evelena Peat MD,  July 14, 2009 9:53 PM  Additional Follow-up for Phone Call Additional follow up Details #1::        Left message to call back. Rudy Jew, RN  July 15, 2009 12:10 PM  Left message with husband to call for PT , INR and OV with Dr. Caryl Never before 08/06/2009  Additional Follow-up by: Lynann Beaver CMA,  July 19, 2009 8:34 AM    Additional Follow-up for Phone Call Additional follow up Details #2::    LMTCB again Follow-up by: Lynann Beaver CMA,  July 18, 2009 8:40 AM  Additional Follow-up for Phone Call Additional follow up Details #3:: Details for Additional Follow-up Action Taken: 07/09/2009 Coumadin 7.5 mg. M, W, F Coumadin 5 mg. other days Return 08/06/2009 for repeat PT and INR>  OK to take over then as instructed.  Let's make sure patient has follow here by 08/06/09, if not sooner. Evelena Peat MD  July 18, 2009 5:29 PM Scheduled appt. Lynann Beaver [facility]  July 19, 2009 2:27 PM  Additional Follow-up by: Lynann Beaver CMA,  July 18, 2009 4:00 PM

## 2010-04-22 NOTE — Progress Notes (Signed)
Summary: RETURN CALL  Phone Note Call from Patient Call back at Home Phone [phone]   Caller: Patient Call For: Angela Peat MD Summary of Call: PT IS RETURNING DR Caryl Never CALL Initial call taken by: Heron Sabins,  April 12, 2009 11:27 AM  Follow-up for Phone Call        returned pt's call but pt not at home.  Left message with husband to have her call back. Follow-up by: Angela Peat MD,  April 12, 2009 6:03 PM

## 2010-04-22 NOTE — Progress Notes (Signed)
Summary: order for labs?   Phone Note Call from Patient   Caller: Patient  [phone] Reason for Call: Talk to Nurse Summary of Call: pt called to set up follow up-wants to know if she needs labs Initial call taken by: Glynda Jaeger,  February 19, 2010 1:27 PM  Follow-up for Phone Call        NA Katina Dung, RN, BSN  February 19, 2010 2:39 PM --[facility] Katina Dung, RN, BSN  February 20, 2010 12:48 PM   per pt calling back [phone] Lorne Skeens  February 20, 2010 12:54 PM --I talked with pt--she has not had cholesterol check in the last 6 months--will order Lipid profile/liver profile / BMP for 04/07/10 prior to 04/11/10 appt with Dr Shirlee Latch

## 2010-04-22 NOTE — Assessment & Plan Note (Signed)
Summary: np6/abn stress test  Medications Added CLARITIN-D 24 HOUR 10-240 MG XR24H-TAB (LORATADINE-PSEUDOEPHEDRINE) once daily OXYCODONE-ACETAMINOPHEN 5-325 MG TABS (OXYCODONE-ACETAMINOPHEN) once or twice a day METOPROLOL TARTRATE 50 MG TABS (METOPROLOL TARTRATE) take one tablet the night before the test and one tablet the morning of the test        Visit Type:  npa Primary Provider:  Dr. Evelena Peat  CC:  extreme fatigne .  History of Present Illness: 65 yo with history of retroperitoneal fibrosis and non-Hodgkin's lymphoma as well as prior DVT presents for evaluation of abnormal myoview.  Patient has had extreme fatigue for months.  She has thought that this was due to depression.  She has had no chest pain.  She does not get short of breath with her usual activities (housework, going up steps).  She gets a little short of breath if she "rushes."  Given her fatigue, a Lexiscan myoview was done.  This was an equivocal study due to subdiaphragmatic nuclear uptake.  EF was calculated to be 49% with global hypokinesis.    Labs (12/10): HCT 34.5, K 4.3, creatinine 1.2, LDL 101, HDL 46  ECG: NSR, normal  Current Medications (verified): 1)  Coumadin 2.5 Mg Tabs (Warfarin Sodium) .... As Directed 2)  Alprazolam 1 Mg Tabs (Alprazolam) .... One Tab Two Times A Day 3)  Wellbutrin Xl 300 Mg Xr24h-Tab (Bupropion Hcl) .... Once Daily 4)  Flonase 50 Mcg/act Susp (Fluticasone Propionate) .... 2 Puffs Daily 5)  Citalopram Hydrobromide 40 Mg Tabs (Citalopram Hydrobromide) .... One By Mouth Once Daily 6)  Claritin-D 24 Hour 10-240 Mg Xr24h-Tab (Loratadine-Pseudoephedrine) .... Once Daily 7)  Oxycodone-Acetaminophen 5-325 Mg Tabs (Oxycodone-Acetaminophen) .... Once or Twice A Day 8)  Metoprolol Tartrate 50 Mg Tabs (Metoprolol Tartrate) .... Take One Tablet The Night Before The Test and One Tablet The Morning of The Test  Allergies: 1)  ! Penicillin V Potassium (Penicillin V Potassium)  Past  History:  Past Medical History: 1. Anxiety Disorder 2. Arthritis 3. Depression 4. Retroperitoneal fibrosis with non-Hodgkin's lymphoma.  This was treated with chemotherapy in 2006 and is in remission.  5. Adenomatous Colon Polyps 6. Diverticulosis 7. DVT left leg/thigh: in both 2005 and 2006, associated with lymphoma.  She is on chronic coumadin.  8. Obesity 9. Left ureteral stent (ureter involved by retroperitoneal fibrosis).  10. Lexiscan myoview (1/11): EF 49% with global hypokinesis.  There appeared to be a mild anterior perfusion defect that could represent ischemia.  However, there was significant subdiaphragmatic nuclear activity that was worse in stress than rest so it is possible that the defect was artifactual.  This study was equivocal.   Family History: Family History of Arthritis mother Family History Diabetes 1st degree relative  deceased sister Family History Hypertension deceased sister Family History of Stroke F 1st degree relative <60  deceased sister Family History of Cardiovascular disorder  mother  Sister had MI and CVA at 78 Mother with CABG in her 50s  Social History: Retired Married Quit smoking 5/10 Alcohol use-yes, rarely Regular exercise-no  Review of Systems       All systems reviewed and negative except as per HPI.   Vital Signs:  Patient profile:   65 year old female Menstrual status:  postmenopausal Height:      67 inches Weight:      239 pounds BMI:     37.57 Pulse rate:   76 / minute BP sitting:   124 / 78  (left arm) Cuff size:  large  Vitals Entered By: Oswald Hillock (May 01, 2009 11:18 AM)  Physical Exam  General:  Well developed, well nourished, in no acute distress. Obese.  Head:  normocephalic and atraumatic Nose:  no deformity, discharge, inflammation, or lesions Mouth:  Teeth, gums and palate normal. Oral mucosa normal. Neck:  Neck supple, no JVD. No masses, thyromegaly or abnormal cervical nodes. Lungs:   Clear bilaterally to auscultation and percussion. Heart:  Non-displaced PMI, chest non-tender; regular rate and rhythm, S1, S2 without murmurs, rubs or gallops. Carotid upstroke normal, no bruit.  Pedals normal pulses. No edema, no varicosities. Abdomen:  Bowel sounds positive; abdomen soft and non-tender without masses, organomegaly, or hernias noted. No hepatosplenomegaly. Msk:  Back normal, normal gait. Muscle strength and tone normal. Extremities:  No clubbing or cyanosis. Neurologic:  Alert and oriented x 3. Skin:  Intact without lesions or rashes. Psych:  Normal affect.   Impression & Recommendations:  Problem # 1:  CARDIOVASCULAR FUNCTION STUDY, ABNORMAL (ICD-794.30) Patient had an equivocal myoview.  This showed EF 49% with diffuse hypokinesis. There was a mild reversible anterior defect but there was also significant gut nuclear uptake that was worse at stress than rest and may have led to an artifactual defect.  This study was equivocal.  Patient has no chest pain or significant shortness of breath but had a myoview done due to generalized fatigue.  The fatigue could certainly be related to depression.  Given equivocal findings, we discussed what would be the best way to approach this.  I offered cardiac catheterization for definitive diagnosis and also a lower radiation dose given her many imaging procedures over the years.   She preferred noninvasive so we will do a coronary CT angiogram.  Unfortunately, given her size and body habitus I do not think she will be a good candidate for lowering the kV or doing prospective gating.  I will have her take metoprolol 50 mg the night before the study and 50 mg the morning of the study.  I will also have her get an echocardiogram for a re-assessment of LV systolic function to see if it is truly low.   Problem # 2:  DVT Has history of prior DVT x 2.  Given recurrent DVTs and retroperitoneal fibrosis, she should probably remain on coumadin  indefinitely.   Other Orders: EKG w/ Interpretation (93000) TLB-BMP (Basic Metabolic Panel-BMET) (80048-METABOL) Echocardiogram (Echo) Cardiac CTA (Cardiac CTA)  Patient Instructions: 1)  Your physician has requested that you have a cardiac CT.  Cardiac computed tomography (CT) is a painless test that uses an x-ray machine to take clear, detailed pictures of your heart.  For further information please visit [email or web address]  Please follow instruction sheet as given. 2)  Take Metoprolol 50mg  (one tablet) the night  before the test and 50mg  (one tablet)  the morning of the test. 3)  Your physician has requested that you have an echocardiogram.  Echocardiography is a painless test that uses sound waves to create images of your heart. It provides your doctor with information about the size and shape of your heart and how well your heart's chambers and valves are working.  This procedure takes approximately one hour. There are no restrictions for this procedure. 4)  Your physician recommends that you return have lab today--BMP [phone] 5)  Your physician recommends that you schedule a follow-up appointment in: about 3 weeks after testing is completed. Prescriptions: METOPROLOL TARTRATE 50 MG TABS (METOPROLOL TARTRATE) take one tablet the night  before the test and one tablet the morning of the test  #2 x 0   Entered by:   Katina Dung, RN, BSN   Authorized by:   Marca Ancona, MD   Signed by:   Katina Dung, RN, BSN on 05/01/2009   Method used:   Electronically to        [facility] [phone]* (retail)       [address] [place], [place]  [address]       Ph: [phone]       Fax: [phone]   RxID:   [id number]

## 2010-04-22 NOTE — Progress Notes (Signed)
Summary: alprazolam 1mg  and mail rx  Phone Note Call from Patient Call back at Work Phone [phone]   Caller: Patient Call For: Evelena Peat MD Summary of Call: Please  call in #10 alprazolam 1mg  ( 5 day supply) emergency order to [facility] [phone] also mail rx to pt at address [address] [place] ,[place] [address]. Pt [place] in [place] end of [place] please make sure rx alprazolam 1 mg twice a day. Initial call taken by: Heron Sabins,  December 16, 2009 3:40 PM  Follow-up for Phone Call        OK to refill. Follow-up by: Evelena Peat MD,  December 17, 2009 9:09 AM  Additional Follow-up for Phone Call Additional follow up Details #1::        Per [place] state prescriptions regulations for controlled med, the pharmacy needs a written and signed RX from doctors office dated today (I did call in the emergency #10 pills.  Will print and have doctor sign.  Address [facility] [address] [place], [place]  [address]   Additional Follow-up by: Sid Falcon LPN,  December 17, 2009 9:27 AM    Additional Follow-up for Phone Call Additional follow up Details #2::    Rx signed, one mailed to pharmacy, other mailed to pt at [place] address Follow-up by: Sid Falcon LPN,  December 17, 2009 11:00 AM  Prescriptions: ALPRAZOLAM 1 MG TABS (ALPRAZOLAM) one tab two times a day  #10 x 0   Entered by:   Sid Falcon LPN   Authorized by:   Evelena Peat MD   Signed by:   Sid Falcon LPN on 57/84/6962   Method used:   Print then Give to Patient   RxID:   [id number] ALPRAZOLAM 1 MG TABS (ALPRAZOLAM) one tab two times a day  #60 x 3   Entered and Authorized by:   Evelena Peat MD   Signed by:   Evelena Peat MD on 12/17/2009   Method used:   Print then Give to Patient   RxID:   [id number]

## 2010-04-22 NOTE — Progress Notes (Signed)
Summary: c/o cramping /pt rtn call/ w/MD rec./nm   Phone Note Call from Patient Call back at Home Phone [phone] Message from:  Patient  Caller: Patient Reason for Call: Talk to Nurse Summary of Call: c/o of exteme  cramping CRESTOR 5 MG TABS take one tablet by mouth once daily in the evening Initial call taken by: Lorne Skeens,  Aug 16, 2009 11:32 AM  Follow-up for Phone Call        this messge was not routed to me until today-it apparently was on another desktop in error--pt not at [phone] was asked to call pt's cell # [phone]--LMTCB Katina Dung, RN, BSN  Aug 20, 2009 12:49 PM [facility] Katina Dung, RN, BSN  Aug 20, 2009 5:45 PM   PT RTN A CALL FROM SOMEONE AND SHE IS IN [place] AND WOULD LIKE SOMEONE TO CALL HER BACK REGARDING STOPPING HER CRESTOR DUE TO MUSCLES ACHES AND SHE HAS SOME CONCERNS AS WELL PLEASE CALL [phone] Omer Jack  August 21, 2009 12:37 PM   Additional Follow-up for Phone Call Additional follow up Details #1::        Spoke with pt.She states was started on Crestor 5 mg and Lisinopril 5 mg daily on 05/30/09. Pt states she tolerated the  medication the first few weeks. Then she started having severe legs pain and pain all over her body. She stoped taken the Crestor 5 mg  on May 26th/11. Pt. would like for Dr. Shirlee Latch know and if he has other recommedations. Ollen Gross, RN, BSN  August 21, 2009 1:58 PM      Appended Document: c/o cramping /pt rtn call/ w/MD rec./nm Try Livalo 2 mg daily + Coenzyme Q 10 100 mg daily  Appended Document: c/o cramping /pt rtn call/ w/MD rec./nm discussed with pt by telephone-she agreed to start Livalo 2mg  daily and CoQ10 100mg  daily--she is requesting prescription [phone] [facility] in [place]   Clinical Lists Changes  Medications: Changed medication from CRESTOR 5 MG TABS (ROSUVASTATIN CALCIUM) take one tablet by mouth once daily in the evening to LIVALO 2 MG TABS (PITAVASTATIN CALCIUM) one tablet daily Added new  medication of COENZYME Q10 100 MG CAPS (COENZYME Q10) one tablet

## 2010-04-22 NOTE — Progress Notes (Signed)
Summary: Nuclear Pre-Procedure  Phone Note Outgoing Call   Call placed by: Milana Na, EMT-P,  April 03, 2009 2:29 PM Summary of Call: Reviewed information on Myoview Information Sheet (see scanned document for further details).  Spoke with patient.     Nuclear Med Background Indications for Stress Test: Evaluation for Ischemia     Symptoms: Fatigue with Exertion    Nuclear Pre-Procedure Cardiac Risk Factors: Family History - CAD, History of Smoking Height (in): 65.75  Nuclear Med Study Referring MD:  Evelena Peat

## 2010-04-24 NOTE — Assessment & Plan Note (Signed)
Summary: PT/Office Visit with Dr. Arrin Pintor/dm   Vital Signs:  Patient profile:   65 year old female Menstrual status:  postmenopausal Weight:      248 pounds Temp:     97.8 degrees F oral BP sitting:   120 / 80  (left arm) Cuff size:   large  Vitals Entered By: Sid Falcon LPN (Aug 01, 2009 1:21 PM)   History of Present Illness: Here for the following items.  History of depression. Dealing with situational stressors with her husband. Overall depressive symptoms stable. Needs refills of citalopram and alprazolam. Denies suicidal ideation.  Recent issues with fatigue and exertional dyspnea. Cardiac workup somewhat equivocal. Proposal for catheterization but patient decided against. Denies chest pain at this time. No progression of fatigue or dyspnea symptoms. Recent addition of lisinopril and Crestor and tolerating well without side effects. Scheduled labs in July. No cough with ACE inhibitor.  Some ongoing allergic postnasal drip symptoms. Takes Claritin and Flonase. Poor control at times.   No fever, headache, or purulent nasal d/c.  chronic Coumadin therapy followed by hematology. History of DVT x2 on chronic coumadin.  Pt has decided to continue protimes through hematology clinic.  Allergies: 1)  ! Penicillin V Potassium (Penicillin V Potassium)  Past History:  Past Medical History: Last updated: 05/30/2009 1. Anxiety Disorder 2. Arthritis 3. Depression 4. Retroperitoneal fibrosis with non-Hodgkin's lymphoma.  This was treated with chemotherapy in 2006 and is in remission.  5. Adenomatous Colon Polyps 6. Diverticulosis 7. DVT left leg/thigh: in both 2005 and 2006, associated with lymphoma.  She is on chronic coumadin.  8. Obesity 9. Left ureteral stent (ureter involved by retroperitoneal fibrosis).  10. CAD: Lexiscan myoview (1/11): EF 49% with global hypokinesis.  There appeared to be a mild anterior perfusion defect that could represent ischemia.  However, there was  significant subdiaphragmatic nuclear activity that was worse in stress than rest so it is possible that the defect was artifactual.  This study was equivocal.  Coronary CT angiogram was done to follow this up in 2/11.  This showed calcium score of 161 (86th percentile for her age and race) and a possible moderate mid LAD stenosis.   11. Mild systolic dysfunction: EF 49% on myoview.  Echo (2/11): EF 45-50%, moderate diastolic dysfunction, mild MR, mild biatrial enlargement, PASP 34 mmHg.   Past Surgical History: Last updated: 04/30/2009 Exploratory abdominal surgery - 2005 Breast tumor excision 1980s.  Meniscal repair right knee 2005.  Renal ureteral stent August 2010 per Dr. Aldean Ast.   Family History: Last updated: 05/01/2009 Family History of Arthritis mother Family History Diabetes 1st degree relative  deceased sister Family History Hypertension deceased sister Family History of Stroke F 1st degree relative <60  deceased sister Family History of Cardiovascular disorder  mother  Sister had MI and CVA at 69 Mother with CABG in her 23s  Social History: Last updated: 05/30/2009 Retired Married but having trouble with her husband.  Quit smoking 5/10 Alcohol use-yes, rarely Regular exercise-no  Risk Factors: Alcohol Use: <1 (05/30/2008) Exercise: no (05/30/2008)  Risk Factors: Smoking Status: quit < 6 months (11/08/2008) Packs/Day: 2.0 (05/30/2008)  Review of Systems       The patient complains of dyspnea on exertion.  The patient denies anorexia, fever, weight loss, chest pain, syncope, peripheral edema, prolonged cough, headaches, hemoptysis, abdominal pain, melena, hematochezia, and severe indigestion/heartburn.    Physical Exam  General:  Well-developed,well-nourished,in no acute distress; alert,appropriate and cooperative throughout examination Ears:  External ear exam  shows no significant lesions or deformities.  Otoscopic examination reveals clear canals, tympanic  membranes are intact bilaterally without bulging, retraction, inflammation or discharge. Hearing is grossly normal bilaterally. Nose:  External nasal examination shows no deformity or inflammation. Nasal mucosa are pink and moist without lesions or exudates. Mouth:  Oral mucosa and oropharynx without lesions or exudates.  Teeth in good repair. Neck:  No deformities, masses, or tenderness noted. Lungs:  Normal respiratory effort, chest expands symmetrically. Lungs are clear to auscultation, no crackles or wheezes. Heart:  normal rate, regular rhythm, and no gallop.   Extremities:  no significant pitting edema Psych:  normally interactive, good eye contact, not anxious appearing, and not depressed appearing.     Impression & Recommendations:  Problem # 1:  DEPRESSION (ICD-311) Assessment Unchanged  Her updated medication list for this problem includes:    Alprazolam 1 Mg Tabs (Alprazolam) ..... One tab two times a day    Wellbutrin Xl 300 Mg Xr24h-tab (Bupropion hcl) ..... Once daily    Citalopram Hydrobromide 40 Mg Tabs (Citalopram hydrobromide) ..... One by mouth once daily  Orders: Prescription Created Electronically [phone])  Problem # 2:  DYSLIPIDEMIA (ICD-272.4) pt scheduled for labs in July. Her updated medication list for this problem includes:    Crestor 5 Mg Tabs (Rosuvastatin calcium) .Marland Kitchen... Take one tablet by mouth once daily in the evening  Problem # 3:  ALLERGIC RHINITIS (ICD-477.9) Assessment: Deteriorated consider trial of astelin but patient wishes to wait 2 weeks until after tree pollen (oak and pine) levels improve.  Complete Medication List: 1)  Coumadin 2.5 Mg Tabs (Warfarin sodium) .... As directed 2)  Alprazolam 1 Mg Tabs (Alprazolam) .... One tab two times a day 3)  Wellbutrin Xl 300 Mg Xr24h-tab (Bupropion hcl) .... Once daily 4)  Citalopram Hydrobromide 40 Mg Tabs (Citalopram hydrobromide) .... One by mouth once daily 5)  Oxycodone-acetaminophen 5-325 Mg  Tabs (Oxycodone-acetaminophen) .... Once or twice a day for shoulder pain 6)  Lisinopril 5 Mg Tabs (Lisinopril) .... Take one tablet by mouth once daily 7)  Crestor 5 Mg Tabs (Rosuvastatin calcium) .... Take one tablet by mouth once daily in the evening 8)  Nitrostat 0.4 Mg Subl (Nitroglycerin) .... Place tablet under tongue.  take tablets 5 minutes apart, up to 3 tablets. 9)  Aspirin 81 Mg Tbec (Aspirin) .... Take one tablet by mouth daily  Patient Instructions: 1)  Please schedule a follow-up appointment in 6 months .  2)  It is important that you exercise reguarly at least 20 minutes 5 times a week. If you develop chest pain, have severe difficulty breathing, or feel very tired, stop exercising immediately and seek medical attention.  3)  You need to lose weight. Consider a lower calorie diet and regular exercise.  Prescriptions: ALPRAZOLAM 1 MG TABS (ALPRAZOLAM) one tab two times a day  #60 x 6   Entered by:   Sid Falcon LPN   Authorized by:   Evelena Peat MD   Signed by:   Sid Falcon LPN on 60/45/4098   Method used:   Telephoned to ...       [facility] [phone]* (retail)       [address] [place], [place]  [address]       Ph: [phone]       Fax: [phone]   RxID:   [id number] CITALOPRAM HYDROBROMIDE 40 MG TABS (CITALOPRAM HYDROBROMIDE) one by mouth once daily  #  30 Tablet x 5   Entered and Authorized by:   Evelena Peat MD   Signed by:   Evelena Peat MD on 08/01/2009   Method used:   Electronically to        [facility] [phone]* (retail)       [address] [place], [place]  [address]       Ph: [phone]       Fax: [phone]   RxID:   [id number]

## 2010-04-24 NOTE — Assessment & Plan Note (Signed)
Summary: med check//ccm   Vital Signs:  Patient profile:   65 year old female Menstrual status:  postmenopausal Weight:      256 pounds Temp:     98.1 degrees F oral BP sitting:   114 / 80  (left arm) Cuff size:   large  Vitals Entered By: Duard Brady LPN (April 07, 2010 4:25 PM) CC: medication refills Is Patient Diabetic? No   History of Present Illness: Patient seen for medical followup.  Since last visit has seen cardiologist.  she had extreme fatigue and was concerned about coronary artery disease possibilities with sister dying of heart disease. She had exercise Myoview which was equivocal with ejection fraction of 49% with global hypokinesis. Subsequent CT angiogram question of mid LAD stenosis. Patient refused catheterization. Echocardiogram ejection fraction 40-50% with moderate diastolic dysfunction.  Livalo 2mg  daily and tolerating well.  For lipids in 2 days.  No chest pain at this time. She has some chronic mild dyspnea. Quit smoking about 2 years ago.  Chronic depression which is stable on citalopram and Wellbutrin. Takes alprazolam 1 mg b.i.d. No suicidal ideation. Continues to have conflict with marriage. Her husband reportedly abuses alcohol. No physical abuse.  No suicidal ideation.  Sleep generally OK with alprazolam.  Patient has intermittent arthritic pains. Rarely takes oxycodone and requests refills. History of DVT and on chronic Coumadin.  Allergies: 1)  ! Penicillin V Potassium (Penicillin V Potassium)  Past History:  Past Medical History: Last updated: 10/01/2009 1. Anxiety Disorder 2. Arthritis 3. Depression 4. Retroperitoneal fibrosis with non-Hodgkin's lymphoma.  This was treated with chemotherapy in 2006 and is in remission.  5. Adenomatous Colon Polyps 6. Diverticulosis 7. DVT left leg/thigh: in both 2005 and 2006, associated with lymphoma.  She is on chronic coumadin.  8. Obesity 9. Left ureteral stent (ureter involved by  retroperitoneal fibrosis).  10. CAD: Lexiscan myoview (1/11): EF 49% with global hypokinesis.  There appeared to be a mild anterior perfusion defect that could represent ischemia.  However, there was significant subdiaphragmatic nuclear activity that was worse in stress than rest so it is possible that the defect was artifactual.  This study was equivocal.  Coronary CT angiogram was done to follow this up in 2/11.  This showed calcium score of 161 (86th percentile for her age and race) and a possible moderate mid LAD stenosis.   11. Mild systolic dysfunction: EF 49% on myoview.  Echo (2/11): EF 45-50%, moderate diastolic dysfunction, mild MR, mild biatrial enlargement, PASP 34 mmHg.  12. Hyperlipidemia: myalgias with Crestor.   Past Surgical History: Last updated: 04/30/2009 Exploratory abdominal surgery - 2005 Breast tumor excision 1980s.  Meniscal repair right knee 2005.  Renal ureteral stent August 2010 per Dr. Aldean Ast.   Family History: Last updated: 05/01/2009 Family History of Arthritis mother Family History Diabetes 1st degree relative  deceased sister Family History Hypertension deceased sister Family History of Stroke F 1st degree relative <60  deceased sister Family History of Cardiovascular disorder  mother  Sister had MI and CVA at 44 Mother with CABG in her 35s  Social History: Last updated: 10/01/2009 Retired Married  Quit smoking 5/10 Alcohol use-yes, rarely Regular exercise-no  Risk Factors: Alcohol Use: <1 (05/30/2008) Exercise: no (05/30/2008)  Risk Factors: Smoking Status: quit < 6 months (11/08/2008) Packs/Day: 2.0 (05/30/2008) PMH-FH-SH reviewed for relevance  Review of Systems  The patient denies anorexia, fever, weight loss, hoarseness, chest pain, syncope, dyspnea on exertion, peripheral edema, prolonged cough, headaches, hemoptysis, abdominal pain,  melena, hematochezia, severe indigestion/heartburn, and depression.    Physical Exam  General:   Well-developed,well-nourished,in no acute distress; alert,appropriate and cooperative throughout examination Head:  Normocephalic and atraumatic without obvious abnormalities. No apparent alopecia or balding. Eyes:  pupils equal, pupils round, and pupils reactive to light.   Ears:  External ear exam shows no significant lesions or deformities.  Otoscopic examination reveals clear canals, tympanic membranes are intact bilaterally without bulging, retraction, inflammation or discharge. Hearing is grossly normal bilaterally. Mouth:  Oral mucosa and oropharynx without lesions or exudates.  Teeth in good repair. Neck:  No deformities, masses, or tenderness noted. Lungs:  Normal respiratory effort, chest expands symmetrically. Lungs are clear to auscultation, no crackles or wheezes. Heart:  normal rate and regular rhythm.   Abdomen:  soft and non-tender.   Extremities:  no edema. Neurologic:  alert & oriented X3 and cranial nerves II-XII intact.   Cervical Nodes:  No lymphadenopathy noted Psych:  Oriented X3, normally interactive, good eye contact, not anxious appearing, and not depressed appearing.     Impression & Recommendations:  Problem # 1:  DEPRESSION (ICD-311)  Her updated medication list for this problem includes:    Alprazolam 1 Mg Tabs (Alprazolam) ..... One tab two times a day    Wellbutrin Xl 300 Mg Xr24h-tab (Bupropion hcl) ..... Once daily    Citalopram Hydrobromide 40 Mg Tabs (Citalopram hydrobromide) ..... One by mouth once daily  Problem # 2:  CAD, NATIVE VESSEL (ICD-414.01)  Her updated medication list for this problem includes:    Lisinopril 5 Mg Tabs (Lisinopril) .Marland Kitchen... Take one tablet by mouth once daily    Nitrostat 0.4 Mg Subl (Nitroglycerin) .Marland Kitchen... Place tablet under tongue.  take tablets 5 minutes apart, up to 3 tablets.    Aspirin 81 Mg Tbec (Aspirin) .Marland Kitchen... Take one tablet by mouth daily  Problem # 3:  OSTEOARTHRITIS, MODERATE (ICD-715.90)  Her updated medication  list for this problem includes:    Oxycodone-acetaminophen 5-325 Mg Tabs (Oxycodone-acetaminophen) ..... Once or twice a day for shoulder pain    Aspirin 81 Mg Tbec (Aspirin) .Marland Kitchen... Take one tablet by mouth daily  Complete Medication List: 1)  Coumadin 2.5 Mg Tabs (Warfarin sodium) .... As directed 2)  Alprazolam 1 Mg Tabs (Alprazolam) .... One tab two times a day 3)  Wellbutrin Xl 300 Mg Xr24h-tab (Bupropion hcl) .... Once daily 4)  Citalopram Hydrobromide 40 Mg Tabs (Citalopram hydrobromide) .... One by mouth once daily 5)  Oxycodone-acetaminophen 5-325 Mg Tabs (Oxycodone-acetaminophen) .... Once or twice a day for shoulder pain 6)  Lisinopril 5 Mg Tabs (Lisinopril) .... Take one tablet by mouth once daily 7)  Livalo 2 Mg Tabs (Pitavastatin calcium) .... One tablet daily 8)  Nitrostat 0.4 Mg Subl (Nitroglycerin) .... Place tablet under tongue.  take tablets 5 minutes apart, up to 3 tablets. 9)  Aspirin 81 Mg Tbec (Aspirin) .... Take one tablet by mouth daily 10)  Coenzyme Q10 100 Mg Caps (Coenzyme q10) .... One tablet 11)  Fluticasone Propionate 50 Mcg/act Susp (Fluticasone propionate) .... Instill 2 sprays into each nostril daily  Patient Instructions: 1)  Please schedule a follow-up appointment in 6 months .  Prescriptions: OXYCODONE-ACETAMINOPHEN 5-325 MG TABS (OXYCODONE-ACETAMINOPHEN) once or twice a day for shoulder pain  #30 x 0   Entered and Authorized by:   Evelena Peat MD   Signed by:   Evelena Peat MD on 04/07/2010   Method used:   Print then Give to Patient   RxID:  [id number] CITALOPRAM HYDROBROMIDE 40 MG TABS (CITALOPRAM HYDROBROMIDE) one by mouth once daily  #90 x 3   Entered and Authorized by:   Evelena Peat MD   Signed by:   Evelena Peat MD on 04/07/2010   Method used:   Electronically to        [facility] [phone]* (retail)       [address] [place], [place]  [address]       Ph: [phone]       Fax: [phone]   RxID:    [id number] WELLBUTRIN XL 300 MG XR24H-TAB (BUPROPION HCL) once daily  #90 Tablet x 3   Entered and Authorized by:   Evelena Peat MD   Signed by:   Evelena Peat MD on 04/07/2010   Method used:   Electronically to        [facility] [phone]* (retail)       [address] [place], [place]  [address]       Ph: [phone]       Fax: [phone]   RxID:   [id number]    Orders Added: 1)  Est. Patient Level IV [71245]  Appended Document: flu vaccine     Clinical Lists Changes  Orders: Added new Service order of Flu Vaccine 67yrs + [phone]) - Signed Added new Service order of Admin 1st Vaccine (33825) - Signed Observations: Added new observation of FLU VAX#1VIS: 10/15/09 version given April 07, 2010. (04/07/2010 17:44) Added new observation of FLU VAXLOT: [id number] (04/07/2010 17:44) Added new observation of FLU VAX EXP: 09/20/2010 (04/07/2010 17:44) Added new observation of FLU VAXBY: Kern Reap CMA (AAMA) (04/07/2010 17:44) Added new observation of FLU VAXRTE: IM (04/07/2010 17:44) Added new observation of FLU VAX DSE: 0.5 ml (04/07/2010 17:44) Added new observation of FLU VAXMFR: GlaxoSmithKline (04/07/2010 17:44) Added new observation of FLU VAX SITE: right deltoid (04/07/2010 17:44) Added new observation of FLU VAX: Fluvax 3+ (04/07/2010 17:44)       Immunizations Administered:  Influenza Vaccine # 1:    Vaccine Type: Fluvax 3+    Site: right deltoid    Mfr: GlaxoSmithKline    Dose: 0.5 ml    Route: IM    Given by: Kern Reap CMA (AAMA)    Exp. Date: 09/20/2010    Lot #: [id number]    VIS given: 10/15/09 version given April 07, 2010.    Physician counseled: yes  Flu Vaccine Consent Questions:    Do you have a history of severe allergic reactions to this vaccine? no    Any prior history of allergic reactions to egg and/or gelatin? no    Do you have a sensitivity to the preservative Thimersol? no    Do you have a past history of  Guillan-Barre Syndrome? no    Do you currently have an acute febrile illness? no    Have you ever had a severe reaction to latex? no    Vaccine information given and explained to patient? yes    Are you currently pregnant? no

## 2010-04-24 NOTE — Assessment & Plan Note (Signed)
Summary: f19m  Medications Added LISINOPRIL 5 MG TABS (LISINOPRIL) take one tablet by mouth once daily CLARITIN 10 MG TABS (LORATADINE) as needed VITAMIN D3 2000 UNIT CAPS (CHOLECALCIFEROL) once daily      Allergies Added:   Visit Type:  6 month follow up Primary Provider:  Dr. Evelena Peat  CC:  no complaints.  History of Present Illness: 65 yo with history of retroperitoneal fibrosis and non-Hodgkin's lymphoma as well as prior DVT presented initially for evaluation of an abnormal myoview.  Patient had extreme fatigue for months.  She has thought that this was due to depression.  She has had no chest pain.  She does not get short of breath with her usual activities (housework, going up steps).  She gets a little short of breath if she walks up a hill. Given her fatigue, a Lexiscan myoview was done.  This was an equivocal study due to subdiaphragmatic nuclear uptake.  EF was calculated to be 49% with global hypokinesis.    Given the equivocal myoview, I had her do a coronary CT angiogram.  This showed a moderate calcium score of 161 (putting her at a high risk percentile for her age) and a possible moderate mid LAD stenosis (though some degradation of images by respiratory artifact).  Echo was done to reassess LV systolic function (reported as mildly decreased on myoview).  This confirmed mild LV systolic dysfunction, EF 45-50% with moderate diastolic dysfunction.  We discussed catheterization, but patient strongly preferred medical treatment.   Patient continues to do well symptomatically.  No chest pain or significant exertional dyspnea.  She is tolerating Livalo with no muscle pains.  Unfortunately, her weight is up another 5 lbs.  She gets very little exercise.  She continues to have a lot of conflict with her husband.     Labs (12/10): HCT 34.5, K 4.3, creatinine 1.2, LDL 101, HDL 46 Labs (7/11): LFTs normal, LDL 69, HDL 47, BNP 21, K 4.3, creatinine 1.2 Labs (1/12): K 5.2, creatinine  1.0, LFTs normal, LDL 59, HDL 46  ECG: NSR, normal   Current Medications (verified): 1)  Coumadin 2.5 Mg Tabs (Warfarin Sodium) .... As Directed 2)  Alprazolam 1 Mg Tabs (Alprazolam) .... One Tab Two Times A Day 3)  Wellbutrin Xl 300 Mg Xr24h-Tab (Bupropion Hcl) .... Once Daily 4)  Citalopram Hydrobromide 40 Mg Tabs (Citalopram Hydrobromide) .... One By Mouth Once Daily 5)  Lisinopril 5 Mg Tabs (Lisinopril) .... Take One Tablet By Mouth Once Daily 6)  Livalo 2 Mg Tabs (Pitavastatin Calcium) .... One Tablet Daily 7)  Nitrostat 0.4 Mg Subl (Nitroglycerin) .... Place Tablet Under Tongue.  Take Tablets 5 Minutes Apart, Up To 3 Tablets. 8)  Aspirin 81 Mg Tbec (Aspirin) .... Take One Tablet By Mouth Daily 9)  Coenzyme Q10 100 Mg Caps (Coenzyme Q10) .... One Tablet 10)  Fluticasone Propionate 50 Mcg/act Susp (Fluticasone Propionate) .... Instill 2 Sprays Into Each Nostril Daily 11)  Claritin 10 Mg Tabs (Loratadine) .... As Needed 12)  Vitamin D3 2000 Unit Caps (Cholecalciferol) .... Once Daily  Allergies (verified): 1)  ! Penicillin V Potassium (Penicillin V Potassium)  Past History:  Past Medical History: Reviewed history from 10/01/2009 and no changes required. 1. Anxiety Disorder 2. Arthritis 3. Depression 4. Retroperitoneal fibrosis with non-Hodgkin's lymphoma.  This was treated with chemotherapy in 2006 and is in remission.  5. Adenomatous Colon Polyps 6. Diverticulosis 7. DVT left leg/thigh: in both 2005 and 2006, associated with lymphoma.  She is  on chronic coumadin.  8. Obesity 9. Left ureteral stent (ureter involved by retroperitoneal fibrosis).  10. CAD: Lexiscan myoview (1/11): EF 49% with global hypokinesis.  There appeared to be a mild anterior perfusion defect that could represent ischemia.  However, there was significant subdiaphragmatic nuclear activity that was worse in stress than rest so it is possible that the defect was artifactual.  This study was equivocal.   Coronary CT angiogram was done to follow this up in 2/11.  This showed calcium score of 161 (86th percentile for her age and race) and a possible moderate mid LAD stenosis.   11. Mild systolic dysfunction: EF 49% on myoview.  Echo (2/11): EF 45-50%, moderate diastolic dysfunction, mild MR, mild biatrial enlargement, PASP 34 mmHg.  12. Hyperlipidemia: myalgias with Crestor.   Family History: Reviewed history from 05/01/2009 and no changes required. Family History of Arthritis mother Family History Diabetes 1st degree relative  deceased sister Family History Hypertension deceased sister Family History of Stroke F 1st degree relative <60  deceased sister Family History of Cardiovascular disorder  mother  Sister had MI and CVA at 23 Mother with CABG in her 56s  Social History: Reviewed history from 10/01/2009 and no changes required. Retired Married  Quit smoking 5/10 Alcohol use-yes, rarely Regular exercise-no  Review of Systems       All systems reviewed and negative except as per HPI.   Vital Signs:  Patient profile:   65 year old female Menstrual status:  postmenopausal Height:      67 inches Weight:      256.25 pounds BMI:     40.28 Pulse rate:   70 / minute BP sitting:   138 / 76  (left arm) Cuff size:   large  Vitals Entered By: Caralee Ates CMA (April 11, 2010 2:31 PM)  Physical Exam  General:  Well developed, well nourished, in no acute distress.  Obese.  Neck:  Neck supple, no JVD. No masses, thyromegaly or abnormal cervical nodes. Lungs:  Clear bilaterally to auscultation and percussion. Heart:  Non-displaced PMI, chest non-tender; regular rate and rhythm, S1, S2 without murmurs, rubs or gallops. Carotid upstroke normal, no bruit. Pedals normal pulses. No edema, no varicosities. Abdomen:  Bowel sounds positive; abdomen soft and non-tender without masses, organomegaly, or hernias noted. No hepatosplenomegaly. Extremities:  No clubbing or cyanosis. Neurologic:   Alert and oriented x 3. Psych:  Normal affect.   Impression & Recommendations:  Problem # 1:  CAD, NATIVE VESSEL (ICD-414.01) Patient has coronary disease, with a high calcium score percentile and a possible moderate stenosis in the mid LAD.  Patient is really essentially asymptomatic at this point from a cardiovascular standpoint.  She does get a little short of breath with heavy exertion but has not had any chest pain or unusual dyspnea. I think that this is probably due most to obesity and deconditioning.  We talked at length at last appointment about a possible catheterization, which I would do from her right radial artery. Given her lack of symptoms and desire to avoid an invasive procedure, we opted for medical management.  I feel that this is a very reasonable choice given her lack of symptoms as we do not have data that would support intervention on an essentially asymptomatic patient.  I will continue aggressive medical management and close followup.  She will continue ASA, statin, ACEI.   Problem # 2:  DYSLIPIDEMIA (ICD-272.4) Continue Livalo.  LDL was at goal (<70) when checked recently.  Patient Instructions: 1)  Your physician wants you to follow-up in: 6  months.(JULY 2012)  You will receive a reminder letter in the mail two months in advance. If you don't receive a letter, please call our office to schedule the follow-up appointment. Prescriptions: LISINOPRIL 5 MG TABS (LISINOPRIL) take one tablet by mouth once daily  #90 x 3   Entered by:   Katina Dung, RN, BSN   Authorized by:   Marca Ancona, MD   Signed by:   Katina Dung, RN, BSN on 04/11/2010   Method used:   Electronically to        [facility] [phone]* (retail)       [address] [place], [place]  [address]       Ph: [phone]       Fax: [phone]   RxID:   [id number]

## 2010-04-24 NOTE — Progress Notes (Signed)
Summary: Fluticasone refill  Phone Note From Pharmacy   Summary of Call: We are receiving requests from pt pharmacy to fill  Fluticasone.  I spoke with pt to explain this med was removed from her med list in March 2011.  She told me she takes this med every day, should not have been removed from her med list.  Will add again and fill Initial call taken by: Sid Falcon LPN,  March 26, 2010 3:06 PM    New/Updated Medications: FLUTICASONE PROPIONATE 50 MCG/ACT SUSP (FLUTICASONE PROPIONATE) instill 2 sprays into each nostril daily Prescriptions: FLUTICASONE PROPIONATE 50 MCG/ACT SUSP (FLUTICASONE PROPIONATE) instill 2 sprays into each nostril daily  #2 x 2   Entered by:   Sid Falcon LPN   Authorized by:   Evelena Peat MD   Signed by:   Sid Falcon LPN on 60/45/4098   Method used:   Electronically to        [facility] [phone]* (retail)       [address] [place], [place]  [address]       Ph: [phone]       Fax: [phone]   RxID:   [id number]

## 2010-04-28 ENCOUNTER — Telehealth (INDEPENDENT_AMBULATORY_CARE_PROVIDER_SITE_OTHER): Payer: Self-pay | Admitting: *Deleted

## 2010-04-29 ENCOUNTER — Ambulatory Visit (HOSPITAL_BASED_OUTPATIENT_CLINIC_OR_DEPARTMENT_OTHER)
Admission: RE | Admit: 2010-04-29 | Discharge: 2010-04-29 | Disposition: A | Discharge status: Home / Self Care | Payer: MEDICARE | Attending: Urology | Admitting: Urology

## 2010-04-29 DIAGNOSIS — C8589 Other specified types of non-Hodgkin lymphoma, extranodal and solid organ sites: Secondary | ICD-10-CM | POA: Insufficient documentation

## 2010-04-29 DIAGNOSIS — Z86718 Personal history of other venous thrombosis and embolism: Secondary | ICD-10-CM | POA: Insufficient documentation

## 2010-04-29 DIAGNOSIS — N1330 Unspecified hydronephrosis: Secondary | ICD-10-CM | POA: Insufficient documentation

## 2010-04-29 DIAGNOSIS — I2510 Atherosclerotic heart disease of native coronary artery without angina pectoris: Secondary | ICD-10-CM | POA: Insufficient documentation

## 2010-04-29 DIAGNOSIS — Z7901 Long term (current) use of anticoagulants: Secondary | ICD-10-CM | POA: Insufficient documentation

## 2010-04-29 LAB — BASIC METABOLIC PANEL
BUN: 18 mg/dL (ref 6–23)
CO2: 27 mEq/L (ref 19–32)
Chloride: 108 mEq/L (ref 96–112)
Creatinine, Ser: 1.07 mg/dL (ref 0.4–1.2)
GFR calc Af Amer: >60 mL/min (ref >60)

## 2010-05-08 NOTE — Progress Notes (Signed)
Summary: Records Request   Faxed OV & EKG to [facility] ([phone]).  Debby Freiberg  April 28, 2010 2:29 PM

## 2010-05-20 ENCOUNTER — Encounter (HOSPITAL_BASED_OUTPATIENT_CLINIC_OR_DEPARTMENT_OTHER): Payer: MEDICARE | Admitting: Hematology and Oncology

## 2010-05-20 ENCOUNTER — Other Ambulatory Visit: Payer: Self-pay | Admitting: Hematology and Oncology

## 2010-05-20 DIAGNOSIS — C8589 Other specified types of non-Hodgkin lymphoma, extranodal and solid organ sites: Secondary | ICD-10-CM

## 2010-05-20 DIAGNOSIS — Z86718 Personal history of other venous thrombosis and embolism: Secondary | ICD-10-CM

## 2010-05-20 DIAGNOSIS — Z7901 Long term (current) use of anticoagulants: Secondary | ICD-10-CM

## 2010-05-20 LAB — PROTIME-INR
INR: 2.8 (ref 2.00–3.50)
Protime: 33.6 s — ABNORMAL HIGH (ref 10.6–13.4)

## 2010-05-21 NOTE — Op Note (Signed)
  [name], [name]           ACCOUNT NO.:  [id number]  MEDICAL RECORD NO.:  [id number]           PATIENT TYPE:  LOCATION:                                 FACILITY:  PHYSICIAN:  Courtney Paris, M.D.DATE OF BIRTH:  July 15, 1945  DATE OF PROCEDURE:  04/29/2010 DATE OF DISCHARGE:                              OPERATIVE REPORT   PREOPERATIVE DIAGNOSIS:  Left hydronephrosis.  POSTOPERATIVE DIAGNOSIS:  Left hydronephrosis.  OPERATION: 1. Cystoscopy. 2. Removal and exchange, left ureteral stent.  ANESTHESIA:  General.  SURGEON:  Courtney Paris, M.D.  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  She has been on potassium citrate to keep the stents open which can be difficult at times.  The stent was last changed on November 12, 2009.  She is admitted for changing the stent at this time.  DESCRIPTION OF PROCEDURE:  The patient was placed on the operating room table in dorsal lithotomy position.  After satisfactory induction of general anesthesia, she was given IV Cipro.  She was prepped and draped with Betadine in usual sterile fashion.  Time-out was then performed and the patient and procedure were then reconfirmed.  A 21 panendoscope was inserted.  The bladder was inspected.  The stent seen coming from the left ureteral orifice was encased with stone.  Pictures were made.  The end of the stent was grabbed and pulled outside the urethral meatus. The end had to be cut off and then a guidewire was then passed through the stent under fluoroscopy up to the level of the kidney.  The stent was then removed.  Over the guidewire, a new 7-French x 26 cm length double-J ureteral stent was passed and as the guidewire was removed, there was a nice coil in the renal pelvis.  The end of the stent was adjusted slightly in the bladder with a nice coil in the bladder and also  the renal pelvis.  Another picture was made of the new stent to enforce to the patient the need to take potassium citrate.  Bladder was drained, B and O suppository inserted.  She was taken recovery room in good condition, will be later discharged as an outpatient.  Plan to change the stent again in about 6 months.    Courtney Paris, M.D.    HMK/MEDQ  D:  04/29/2010  T:  04/29/2010  Job:  [id number]  Electronically Signed by Vic Blackbird M.D. on 05/21/2010 07:57:08 AM

## 2010-06-05 LAB — POCT I-STAT 4, (NA,K, GLUC, HGB,HCT)
Potassium: 4.2 mEq/L (ref 3.5–5.1)
Sodium: 141 mEq/L (ref 135–145)

## 2010-06-11 LAB — BASIC METABOLIC PANEL
BUN: 17 mg/dL (ref 6–23)
CO2: 28 mEq/L (ref 19–32)
Chloride: 106 mEq/L (ref 96–112)
GFR calc non Af Amer: 56 mL/min — ABNORMAL LOW (ref >60)
Glucose, Bld: 124 mg/dL — ABNORMAL HIGH (ref 70–99)
Potassium: 4.5 mEq/L (ref 3.5–5.1)
Sodium: 140 mEq/L (ref 135–145)

## 2010-06-11 LAB — CBC
HCT: 33.3 % — ABNORMAL LOW (ref 36.0–46.0)
Hemoglobin: 11 g/dL — ABNORMAL LOW (ref 12.0–15.0)
MCV: 88.3 fL (ref 78.0–100.0)
Platelets: 185 10*3/uL (ref 150–400)
RDW: 15.9 % — ABNORMAL HIGH (ref 11.5–15.5)

## 2010-06-11 LAB — APTT: aPTT: 28 seconds (ref 24–37)

## 2010-06-26 LAB — URINE MICROSCOPIC-ADD ON

## 2010-06-26 LAB — CBC
HCT: 24.4 % — ABNORMAL LOW (ref 36.0–46.0)
HCT: 26.7 % — ABNORMAL LOW (ref 36.0–46.0)
HCT: 29.8 % — ABNORMAL LOW (ref 36.0–46.0)
Hemoglobin: 8.5 g/dL — ABNORMAL LOW (ref 12.0–15.0)
Hemoglobin: 9.2 g/dL — ABNORMAL LOW (ref 12.0–15.0)
MCHC: 34.7 g/dL (ref 30.0–36.0)
MCV: 90.1 fL (ref 78.0–100.0)
MCV: 90.4 fL (ref 78.0–100.0)
MCV: 90.4 fL (ref 78.0–100.0)
Platelets: 138 10*3/uL — ABNORMAL LOW (ref 150–400)
RBC: 2.71 MIL/uL — ABNORMAL LOW (ref 3.87–5.11)
RBC: 3.29 MIL/uL — ABNORMAL LOW (ref 3.87–5.11)
RDW: 14.5 % (ref 11.5–15.5)
WBC: 5.2 10*3/uL (ref 4.0–10.5)
WBC: 5.2 10*3/uL (ref 4.0–10.5)

## 2010-06-26 LAB — URINALYSIS, ROUTINE W REFLEX MICROSCOPIC
Ketones, ur: NEGATIVE mg/dL
Nitrite: NEGATIVE
Protein, ur: 30 mg/dL — AB
pH: 6.5 (ref 5.0–8.0)

## 2010-06-26 LAB — BASIC METABOLIC PANEL
BUN: 8 mg/dL (ref 6–23)
CO2: 27 mEq/L (ref 19–32)
CO2: 27 mEq/L (ref 19–32)
Calcium: 8 mg/dL — ABNORMAL LOW (ref 8.4–10.5)
Chloride: 104 mEq/L (ref 96–112)
Chloride: 105 mEq/L (ref 96–112)
GFR calc non Af Amer: 59 mL/min — ABNORMAL LOW (ref >60)
Glucose, Bld: 110 mg/dL — ABNORMAL HIGH (ref 70–99)
Glucose, Bld: 111 mg/dL — ABNORMAL HIGH (ref 70–99)
Glucose, Bld: 137 mg/dL — ABNORMAL HIGH (ref 70–99)
Potassium: 3.7 mEq/L (ref 3.5–5.1)
Potassium: 4.5 mEq/L (ref 3.5–5.1)
Sodium: 131 mEq/L — ABNORMAL LOW (ref 135–145)
Sodium: 135 mEq/L (ref 135–145)
Sodium: 137 mEq/L (ref 135–145)

## 2010-06-26 LAB — PROTIME-INR
INR: 1.02 (ref 0.00–1.49)
INR: 2.06 — ABNORMAL HIGH (ref 0.00–1.49)
Prothrombin Time: 13.3 seconds (ref 11.6–15.2)

## 2010-06-26 LAB — TYPE AND SCREEN

## 2010-06-28 LAB — URINE MICROSCOPIC-ADD ON

## 2010-06-28 LAB — BASIC METABOLIC PANEL
Calcium: 9.6 mg/dL (ref 8.4–10.5)
GFR calc non Af Amer: 49 mL/min — ABNORMAL LOW (ref >60)
Glucose, Bld: 126 mg/dL — ABNORMAL HIGH (ref 70–99)
Potassium: 4.7 mEq/L (ref 3.5–5.1)
Sodium: 141 mEq/L (ref 135–145)

## 2010-06-28 LAB — CBC
HCT: 36.3 % (ref 36.0–46.0)
Hemoglobin: 12.1 g/dL (ref 12.0–15.0)
Platelets: 207 10*3/uL (ref 150–400)
RDW: 15.1 % (ref 11.5–15.5)
WBC: 4.3 10*3/uL (ref 4.0–10.5)

## 2010-06-28 LAB — DIFFERENTIAL
Basophils Absolute: 0 10*3/uL (ref 0.0–0.1)
Eosinophils Relative: 1 % (ref 0–5)
Lymphocytes Relative: 27 % (ref 12–46)
Lymphs Abs: 1.2 10*3/uL (ref 0.7–4.0)
Monocytes Absolute: 0.5 10*3/uL (ref 0.1–1.0)
Neutro Abs: 2.6 10*3/uL (ref 1.7–7.7)

## 2010-06-28 LAB — PROTIME-INR: Prothrombin Time: 14.4 seconds (ref 11.6–15.2)

## 2010-06-28 LAB — URINALYSIS, ROUTINE W REFLEX MICROSCOPIC
Bilirubin Urine: NEGATIVE
Glucose, UA: NEGATIVE mg/dL
Ketones, ur: NEGATIVE mg/dL
Specific Gravity, Urine: 1.017 (ref 1.005–1.030)
pH: 5.5 (ref 5.0–8.0)

## 2010-07-02 LAB — POCT I-STAT, CHEM 8
Chloride: 107 mEq/L (ref 96–112)
Glucose, Bld: 89 mg/dL (ref 70–99)
HCT: 30 % — ABNORMAL LOW (ref 36.0–46.0)
Hemoglobin: 10.2 g/dL — ABNORMAL LOW (ref 12.0–15.0)
Potassium: 4.1 mEq/L (ref 3.5–5.1)
Sodium: 139 mEq/L (ref 135–145)

## 2010-07-07 LAB — CBC
HCT: 27.5 % — ABNORMAL LOW (ref 36.0–46.0)
HCT: 27.3 % — ABNORMAL LOW (ref 36.0–46.0)
Hemoglobin: 9.3 g/dL — ABNORMAL LOW (ref 12.0–15.0)
MCHC: 33.2 g/dL (ref 30.0–36.0)
MCHC: 33.8 g/dL (ref 30.0–36.0)
MCHC: 33.1 g/dL (ref 30.0–36.0)
MCV: 90.9 fL (ref 78.0–100.0)
MCV: 92.4 fL (ref 78.0–100.0)
Platelets: 158 10*3/uL (ref 150–400)
RBC: 3.17 MIL/uL — ABNORMAL LOW (ref 3.87–5.11)
RDW: 14.8 % (ref 11.5–15.5)
WBC: 5.3 10*3/uL (ref 4.0–10.5)

## 2010-07-07 LAB — BASIC METABOLIC PANEL
BUN: 7 mg/dL (ref 6–23)
CO2: 26 mEq/L (ref 19–32)
CO2: 28 mEq/L (ref 19–32)
CO2: 29 mEq/L (ref 19–32)
Calcium: 8.3 mg/dL — ABNORMAL LOW (ref 8.4–10.5)
Calcium: 8.6 mg/dL (ref 8.4–10.5)
Chloride: 100 mEq/L (ref 96–112)
Chloride: 104 mEq/L (ref 96–112)
Creatinine, Ser: 0.71 mg/dL (ref 0.4–1.2)
Creatinine, Ser: 0.73 mg/dL (ref 0.4–1.2)
GFR calc Af Amer: >60 mL/min (ref >60)
GFR calc non Af Amer: >60 mL/min (ref >60)
Glucose, Bld: 151 mg/dL — ABNORMAL HIGH (ref 70–99)
Glucose, Bld: 121 mg/dL — ABNORMAL HIGH (ref 70–99)
Potassium: 4.2 mEq/L (ref 3.5–5.1)
Potassium: 3.9 mEq/L (ref 3.5–5.1)
Sodium: 133 mEq/L — ABNORMAL LOW (ref 135–145)
Sodium: 140 mEq/L (ref 135–145)

## 2010-07-07 LAB — URINALYSIS, ROUTINE W REFLEX MICROSCOPIC
Bilirubin Urine: NEGATIVE
Ketones, ur: NEGATIVE mg/dL
Nitrite: NEGATIVE
Protein, ur: NEGATIVE mg/dL
Urobilinogen, UA: 0.2 mg/dL (ref 0.0–1.0)

## 2010-07-07 LAB — ABO/RH: ABO/RH(D): B POS

## 2010-07-07 LAB — TYPE AND SCREEN
ABO/RH(D): B POS
Antibody Screen: NEGATIVE

## 2010-07-07 LAB — PROTIME-INR
INR: 1 (ref 0.00–1.49)
Prothrombin Time: 13.9 seconds (ref 11.6–15.2)
Prothrombin Time: 24.1 seconds — ABNORMAL HIGH (ref 11.6–15.2)

## 2010-07-07 LAB — APTT: aPTT: 41 seconds — ABNORMAL HIGH (ref 24–37)

## 2010-07-15 ENCOUNTER — Encounter (HOSPITAL_BASED_OUTPATIENT_CLINIC_OR_DEPARTMENT_OTHER): Payer: MEDICARE | Admitting: Hematology and Oncology

## 2010-07-15 ENCOUNTER — Other Ambulatory Visit: Payer: Self-pay | Admitting: Hematology and Oncology

## 2010-07-15 DIAGNOSIS — Z7901 Long term (current) use of anticoagulants: Secondary | ICD-10-CM

## 2010-07-15 DIAGNOSIS — C8589 Other specified types of non-Hodgkin lymphoma, extranodal and solid organ sites: Secondary | ICD-10-CM

## 2010-07-15 DIAGNOSIS — Z86718 Personal history of other venous thrombosis and embolism: Secondary | ICD-10-CM

## 2010-07-23 ENCOUNTER — Ambulatory Visit (INDEPENDENT_AMBULATORY_CARE_PROVIDER_SITE_OTHER): Payer: MEDICARE | Admitting: Family Medicine

## 2010-07-23 ENCOUNTER — Encounter: Payer: Self-pay | Admitting: Family Medicine

## 2010-07-23 VITALS — BP 140/80 | Temp 98.2°F | Wt 262.0 lb

## 2010-07-23 DIAGNOSIS — R05 Cough: Secondary | ICD-10-CM

## 2010-07-23 DIAGNOSIS — J209 Acute bronchitis, unspecified: Secondary | ICD-10-CM

## 2010-07-23 MED ORDER — DOXYCYCLINE HYCLATE 100 MG PO TABS: 100.0000 mg | ORAL_TABLET | Freq: Two times a day (BID) | ORAL | Status: AC

## 2010-07-23 NOTE — Progress Notes (Signed)
  Subjective:    Patient ID: [name], female    DOB: Aug 27, 1945, 65 y.o.   MRN: [id number]  HPI Patient seen with three-day history of cough and some increased shortness of breath. Question low-grade fever initially. Minimal if any nasal symptoms. No hemoptysis. No pleuritic pain. Some rib cage pain from coughing. She still smokes. No recent appetite or weight changes. History of frequent bronchitis in past. She denies any chest pains. Prior history of non-Hodgkin's lymphoma. No recent lymphadenopathy or any night sweats.   Review of Systems  Constitutional: Negative for chills.  HENT: Negative for sore throat and postnasal drip.   Respiratory: Positive for cough and shortness of breath. Negative for wheezing.   Cardiovascular: Negative for chest pain, palpitations and leg swelling.  Hematological: Negative for adenopathy. Does not bruise/bleed easily.       Objective:   Physical Exam  Constitutional: She appears well-developed and well-nourished. No distress.  HENT:  Right Ear: External ear normal.  Left Ear: External ear normal.  Mouth/Throat: Oropharynx is clear and moist. No oropharyngeal exudate.  Neck: Neck supple. No thyromegaly present.  Cardiovascular: Normal rate, regular rhythm and normal heart sounds.   Pulmonary/Chest: Effort normal and breath sounds normal. No respiratory distress. She has no wheezes. She has no rales.  Lymphadenopathy:    She has no cervical adenopathy.  Skin: No rash noted.          Assessment & Plan:  Acute bronchitis. She is again encouraged to stop smoking. Doxycycline 100 mg twice a day for 10 days. No reactive airway changes noted this time but if cough persists consider course of prednisone

## 2010-07-23 NOTE — Patient Instructions (Signed)
Call or follow up promptly for any increasing fever, shortness of breath, or any other problems.

## 2010-07-24 ENCOUNTER — Encounter: Payer: Self-pay | Admitting: Family Medicine

## 2010-08-05 NOTE — Op Note (Signed)
NAMEJACQUELINNE, [name]           ACCOUNT NO.:  [id number]   MEDICAL RECORD NO.:  [id number]          PATIENT TYPE:  INP   LOCATION:  0006                         FACILITY:  [facility]   PHYSICIAN:  Ollen Gross, M.D.    DATE OF BIRTH:  [birth year]   DATE OF PROCEDURE:  04/04/2008  DATE OF DISCHARGE:                               OPERATIVE REPORT   PREOPERATIVE DIAGNOSIS:  Osteoarthritis, left knee.   POSTOPERATIVE DIAGNOSIS:  Osteoarthritis, left knee.   PROCEDURE:  Left total knee arthroplasty.   SURGEON:  Aluisio.   ASSISTANT:  Avel Peace PA-C.   ANESTHESIA:  General with postop Marcaine pain pump.   ESTIMATED BLOOD LOSS:  Minimal.   DRAINS:  None.   TOURNIQUET TIME:  34 minutes at 300 mmHg.   COMPLICATIONS:  None.   CONDITION:  Stable to recovery room.   CLINICAL NOTE:  [name] is a 65 year old female with end-stage  arthritis of both knees, left more symptomatic than the right.  She has  failed nonoperative management and presents now for total knee  arthroplasty.   PROCEDURE IN DETAIL:  After the successful administration of general  anesthetic, a tourniquet is placed high on her left thigh and her left  lower extremity is prepped and draped in the usual sterile fashion.  Extremity is wrapped in Esmarch, knee flexed, tourniquet inflated to 300  mmHg.  Midline incision is made with 10 blade through subcutaneous  tissue to the level of the extensor mechanism.  A fresh blade is used to  make a medial parapatellar arthrotomy.  Soft tissue of the proximal  medial tibia is subperiosteally elevated to the joint line with the  knife and into the semimembranosus bursa with a Cobb elevator.  Soft  tissue laterally is elevated with attention being paid to avoid patellar  tendon on tibial tubercle.  Patella subluxed laterally, knee flexed 90  degrees, ACL and PCL removed.  Drill was used create a starting hole in  the distal femur and the canal is thoroughly irrigated.   The 5 degree  left valgus alignment guide is placed and referencing off the posterior  condyles rotation is marked and the block pinned to remove 10 mm from  the distal femur.  Distal femoral resection is made with an oscillating  saw.  Sizing block is placed and size 3 appears to be most appropriate.  Rotation is marked off the epicondylar axis.  Size 3 cutting block is  placed and the anterior, posterior and chamfer cuts are made.   The tibia is subluxed forward and the menisci are removed.  Extramedullary tibial alignment guide is placed referencing proximally  at the medial aspect of the tibial tubercle and distally along the  second metatarsal axis and tibial crest.  Block is pinned to remove 10  mm off the non deficient lateral side.  Tibial resection is made with an  oscillating saw.  Size 3 is the most appropriate tibial component and  the proximal tibia is prepared with the modular drill and keel punch for  the size 3.  Femoral preparation is completed with the intercondylar  cut.  Size 3 mobile bearing tibial trial, size 3 posterior stabilized femoral  trial and a 10 mm posterior stabilized rotating platform insert trial  are placed.  With the 10 there is a little bit of the anterior-posterior  play, so went to 12.5 which allowed for full extension with excellent  varus-valgus anterior-posterior balance throughout full range of motion.  Patella was everted and thickness measured to 22 mm.  Freehand resection  taken to 12 mm, 38 template is placed, lug holes were drilled, trial  patella is placed and it tracks normally.  Osteophytes are removed off  the posterior femur with the trial in place.  All trials are removed and  the cut bone surfaces are prepared with pulsatile lavage.  Cement is  mixed.  Once ready for implantation a size 3 mobile bearing tibial tray,  size 3 posterior stabilized femur and 38 patella are cemented into  place.  The patella is held with a clamp.   Trial 12.5 mm insert is  placed, knee held in full extension and all extruded cement removed.  When cement is fully hardened then the permanent 12.5 mm posterior  stabilized rotating platform insert is placed into the tibial tray.  The  wound is copiously irrigated with saline solution and then the FloSeal  is injected on the posterior capsule, mediolateral gutters and  suprapatellar area.  Moist sponge is placed and tourniquet released with  a total time of 34 minutes.  The sponge is held for 2 minutes then  removed.  Minimal bleeding is encountered.  The bleeding that is  encountered is stopped with electrocautery.  I then thoroughly irrigated  again and closed the arthrotomy with interrupted #1 PDS.  Flexion  against gravity is about 120 degrees.  The subcu is closed with  interrupted 2-0 Vicryl and subcuticular running 4-0 Monocryl.  The  incision is cleaned and dried and Steri-Strips and bulky sterile  dressing applied.  She is then placed into a knee immobilizer, awakened  and transferred to recovery in stable condition.      Ollen Gross, M.D.  Electronically Signed     FA/MEDQ  D:  04/04/2008  T:  04/04/2008  Job:  [id number]

## 2010-08-05 NOTE — Op Note (Signed)
[name], [name]           ACCOUNT NO.:  [id number]   MEDICAL RECORD NO.:  [id number]          PATIENT TYPE:  AMB   LOCATION:  NESC                         FACILITY:  [facility]   PHYSICIAN:  Courtney Paris, M.D.DATE OF BIRTH:  15-Mar-1946   DATE OF PROCEDURE:  07/11/2007  DATE OF DISCHARGE:                               OPERATIVE REPORT   PREOPERATIVE DIAGNOSIS:  Left hydronephrosis (not stone), left renal  atrophy.   POSTOPERATIVE DIAGNOSIS:  Left hydronephrosis (not stone), left renal  atrophy.   OPERATION:  Cystoscopy, exchange of left ureteral stent.   ANESTHESIA:  General.   SURGEON:  Courtney Paris, M.D.   BRIEF HISTORY:  This 65 year old lady was admitted with left ureteral  obstruction for exchange of a double-J stent, last changed July of 2008.  She has had stents since July of 2005, first up in [place], usually  every 3-6 months.  The patient went from January of 2008 to July of 2008  when she had the last one changed.  She has a retroperitoneal mass which  was a B cell lymphoma, and she has an atrophic left kidney with just 22%  function.  She does take Coumadin for chronic thrombophlebitis but  stopped this 3 days ago.   DESCRIPTION OF PROCEDURE:  After a time-out, the procedure was started.  She was prepped and draped with Betadine in the usual sterile fashion.  The cystoscope, 21-French, was inserted and the bladder inspected.  The  stent was seen coming from the left orifice.  It was somewhat encrusted  by stone, but the lumen did appear to be open.  The end of the stent was  grasped with forceps and pulled outside the urethral meatus.  A  guidewire went through the stent up to the level of the kidney, but I  could not advance it through the end of the stent.  I pulled the  guidewire out, and then along with the cystoscope in place, I passed the  guidewire alongside the stent and then removed this.  The guidewire was  then passed up to the level  of the kidney without difficulty, and over  this, a new 7-French x 26-cm length double-J stent was passed with a  coil in the proximal renal pelvis and one in the bladder and in good  position.  The scope was removed.  B&O suppository inserted.   The patient taken to the recovery room in good condition.  She will be  started back on her Coumadin tomorrow.      Courtney Paris, M.D.  Electronically Signed     HMK/MEDQ  D:  07/11/2007  T:  07/11/2007  Job:  [id number]

## 2010-08-05 NOTE — Discharge Summary (Signed)
NAMEAMRI, LIEN           ACCOUNT NO.:  [id number]   MEDICAL RECORD NO.:  [id number]          PATIENT TYPE:  INP   LOCATION:  1602                         FACILITY:  [facility]   PHYSICIAN:  Ollen Gross, M.D.    DATE OF BIRTH:  [birth year]   DATE OF ADMISSION:  04/04/2008  DATE OF DISCHARGE:  04/07/2008                               DISCHARGE SUMMARY   ADMITTING DIAGNOSES:  1. Severe osteoarthritis, bilateral knees, left more symptomatic than      right.  2. History of deep vein thromboses.  3. Chronic Coumadin.  4. Non-Hodgkin lymphoma.  5. Retroperitoneal fibrosis.  6. Reflux disease.  7. Hiatal hernia.  8. Depression.  9. Anxiety.   DISCHARGE DIAGNOSES:  1. Osteoarthritis, left knee, status post left total knee replacement      arthroplasty.  2. Mild postoperative acute blood loss anemia, did not require      transfusion.  3. Mild postoperative hyponatremia.  4. History of deep vein thromboses.  5. Chronic Coumadin.  6. Non-Hodgkin lymphoma.  7. Retroperitoneal fibrosis.  8. Reflux disease.  9. Hiatal hernia.  10.Depression.  11.Anxiety.   PROCEDURE:  April 04, 2008, left total knee.  Surgeon, Dr. Lequita Halt.  Assistant, Avel Peace, P.A.-C.  Anesthesia, general with Marcaine pain  pump.   CONSULTS:  None.   BRIEF HISTORY:  [name] is a 65 year old female with end-stage  arthritis of both knees, left more symptomatic than right, failed  nonoperative management, now presents for a total knee arthroplasty.   LABORATORY DATA:  Preop CBC showed hemoglobin of 12.6, hematocrit 37.3,  white cell count 5.2, platelets 233.  Chem panel on admission all within  normal limits.  Serial CBCs were followed throughout the hospital  course.  Hemoglobin dropped down to 9.6 and 9.3.  Last noted H and H of  9.0 and 27.3.  PT/PTT on admission 13.9 and 41 respectively.  INR 1.0.  Serial pro times followed per Coumadin protocol.  Last noted PT/INR 24.1  and 2.0.  Serial  BMETs were followed.  Sodium did drop down to 133 but  came back up to 137.  Preop UA, trace leukocyte esterase, rare  epithelials, 3 to 6 white cells, too numerous red cells, many bacteria.  Blood group type B+.   EKG, July 11, 2007, normal sinus rhythm, normal EKG, no significant  change from the last tracing of January 2008, confirmed by Dr. Verdis Prime.   HOSPITAL COURSE:  Patient admitted to [facility], tolerated  procedure well, later transferred to recovery room orthopedic floor,  started on PCA and p.o. analgesic pain control following surgery, given  24-hours postop IV antibiotics.  Started on PCA which she used very  little of so weaned over to p.o. meds quickly.  Hemoglobin was down  postop down to 9.6.  Did put her on iron.  Had a history of clots and  put back on Coumadin.  Due to her chronic Coumadin, she was also placed  on Lovenox bridging.  Started back on the remaining home meds.  Started  getting up out of bed on day 1 and  walked over 70 feet, doing very well.  By day 2, hemoglobin was down a little bit further to 9.3.  She was  asymptomatic with this.  Pressure was stable.  Dressing change, incision  looked good.  Continued to progress well with therapy walking 100 to 120  feet.  By day 2 and day 3, she was doing well, no complaints, ready to  go home.   DISCHARGE PLAN:  1. Patient discharged home on April 07, 2008.  2. Discharge diagnoses:  Please see above.  3. Discharge meds:  Percocet, Robaxin, Nu-Iron, and Coumadin.   DIET:  As tolerated.   ACTIVITY:  Total knee protocol.  Home health PT and home health nursing.  Weightbearing as tolerated.   FOLLOWUP:  Two weeks.   DISPOSITION:  Home.   CONDITION UPON DISCHARGE:  Improving.      Alexzandrew L. Perkins, P.A.C.      Ollen Gross, M.D.  Electronically Signed    ALP/MEDQ  D:  05/19/2008  T:  05/19/2008  Job:  [id number]   cc:   Ollen Gross, M.D.  Fax: [phone]   Evelena Peat, M.D.   Lauretta I. Odogwu, M.D.  Fax: [phone]

## 2010-08-05 NOTE — H&P (Signed)
[name], [name] NO.:  [id number]   MEDICAL RECORD NO.:  [id number]         PATIENT TYPE:  LINP   LOCATION:                               FACILITY[facility]  [facility]   PHYSICIAN:  Ollen Gross, M.D.    DATE OF BIRTH:  Mar 28, 1945   DATE OF ADMISSION:  04/04/2008  DATE OF DISCHARGE:                              HISTORY & PHYSICAL   DATE OF ADMISSION:  Is April 04, 2008.   CHIEF COMPLAINT:  Painful bilateral knees currently left more  problematic than right.   HISTORY OF PRESENT ILLNESS:  The patient is a 65 year old female with  longstanding problems with progressively worsening bilateral knee pain.  She states that both knees bother her at this present time, and she has  pain with range of motion and ambulation.  She has failed conservative  treatment.  The patient has elected to have her left total knee  arthroplasty prior to her right.  X-rays reveal bone-on-bone lateral  compartment and the patellofemoral joint.   ALLERGIES:  PENICILLIN.   CURRENT MEDICATIONS:  1. Xanax 1 mg twice a day.  2. Celexa 20 mg once a day.  3. Wellbutrin 300 mg once a day.  4,  Alavert once a day.  1. Flonase 2 sprays once a day.  2. Coumadin 7.5 mg once a day.  3. Symbicort Multidose inhaler once a day.   PAST MEDICAL HISTORY:  Includes:  1. Blood clots in the lower extremity on chronic Coumadin therapy.  2. Non-Hodgkin's lymphoma.  3. Retroperitoneal fibrosis.  4. Reflux disease.  5. Hiatal hernia.  6. Depression/anxiety.   REVIEW OF SYSTEMS:  NEUROLOGIC:  She does have some issues related to  anxiety and depression and these are well managed at the present time.  PULMONARY:  She has had pneumonia in the past.  She does have  environmental allergies  presently that cause some wheezing and that is why she is on inhalers,  and they are improving.  She has no productive cough.  No fevers,  chills.  CARDIOVASCULAR:  Is unremarkable.  GI:  She does have issues  related to  hiatal hernia and reflux which are well controlled on over-  the-counter medications.  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 ENDOCRINE:  Is  unremarkable.  HEMATOLOGIC.  She does have history of blood clots.  Last  blood clot was in 2006 but she is on chronic Coumadin therapy managed by  her oncologist who manages her non-Hodgkin's lymphoma.   PAST SURGICAL HISTORY:  1. Breast tumor removed in the 1980s.  2. Meniscus repair in her right knee in 2005.  3. She denies any anesthesia complications.   FAMILY MEDICAL HISTORY:  Father is deceased from congestive heart  failure at the age of 64.  Mother is deceased from congestive heart  failure at the age of 33.  Recently sister is deceased and autopsy is  presently pending.   SOCIAL HISTORY:  The patient is married.  She currently smokes about two  packs a day for the past 55 years.  No alcohol.  No drugs.  She has  three grown children.  She currently lives in a single family home, two  levels.  She will have her husband who will provide care for her  postoperative.   PRIMARY CARE PHYSICIAN:  Is Dr. Evelena Peat and he has given medical  clearance.  Oncologist is Dr. Dalene Carrow with the [facility], who manages her chronic Coumadin therapy.   PHYSICAL EXAM:  VITALS:  Height is 5 feet 7 inches.  Weight is 226.  Blood pressure is 118/78, pulse of 70 and regular, respirations 12.  Patient is afebrile.  GENERAL:  This is a healthy well-developed, heavy-set female, conscious,  alert and appropriate, appears to be in no extreme distress.  HEENT:  Head is normocephalic.  Pupils equal, round, reactive.  Gross  hearing is intact.  NECK:  Was supple.  No palpable lymphadenopathy.  Good range of motion.  CHEST:  Lung sounds were clear  throughout.  HEART:  Regular rate and rhythm.  ABDOMEN:  Soft, nontender.  Bowel sounds present.  EXTREMITIES:  She had good range of motion of both upper extremities  without any difficulty.  LOWER EXTREMITIES:  She has good motion in both hips.  Right knee lacked  about 5 degrees of extension.  She can flex it back to 120.  Left knee:  She had full extension.  She was able to flex it back to 120.  No  instability about either knee.  She did have significant crepitus in  both knees with motion.  No effusions.  Calves were soft, good motion of  both ankles.  NEURO:  The patient was conscious, alert and appropriate without any  gross neurologic defects noted.  PERIPHERAL VASCULAR:  Carotid pulses were 2+, no bruits.  Radial pulses  were 2+, posterior tibial pulses were 1+.  BREAST, RECTAL AND GU:  Exams were deferred at this time.   IMPRESSION:  1. Severe osteoarthritis bilateral knees.  2. History of blood clots and DVTs on chronic Coumadin therapy.  3. Non-Hodgkin's lymphoma.  4. Retroperitoneal fibrosis.  5. Reflux disease.  6. Hiatal hernia.  7. Depression/anxiety.   PLAN:  The patient has been evaluated by her primary care physician and  has been cleared for this upcoming surgical procedure.  She is going to  talk to her oncologist who is currently managing her Coumadin therapy  due to her chronic deep vein thromboses and she will need bridging  therapy with Lovenox both preoperatively and postoperatively.  The  patient will otherwise undergo all routine laboratories and tests prior  to having a left total knee  arthroplasty by Dr. Lequita Halt at [facility] on April 04, 2008.  The patient also indicates that she has had some rashes and itching with  inexpensive jewelry, so we will test her for mental allergies with disks  prior to this surgical procedure.      Jamelle Rushing, P.A.      Ollen Gross, M.D.  Electronically Signed    RWK/MEDQ  D:  03/29/2008   T:  03/29/2008  Job:  [id number]   cc:   Ollen Gross, M.D.  Fax: [phone]

## 2010-08-05 NOTE — Op Note (Signed)
NAMESAMIKSHA, PELLICANO           ACCOUNT NO.:  [id number]   MEDICAL RECORD NO.:  [id number]          PATIENT TYPE:  AMB   LOCATION:  NESC                         FACILITY:  [facility]   PHYSICIAN:  Courtney Paris, M.D.DATE OF BIRTH:  20-Aug-1945   DATE OF PROCEDURE:  10/20/2006  DATE OF DISCHARGE:                               OPERATIVE REPORT   PREOPERATIVE DIAGNOSIS:  Left ureteral obstruction (not stone).   POSTOPERATIVE DIAGNOSIS:  Left ureteral obstruction (not stone).   OPERATION:  Cystoscopy, remove and reinsert left ureteral stent.   ANESTHESIA:  General.   SURGEON:  Courtney Paris, M.D.   BRIEF HISTORY:  This 65 year old patient has retroperitoneal fibrosis  from lymphoma.  She has had a left ureteral stent since July 2005, first  put in in [place], then changed every 3-6 months, the last was done  January 2008.  She has no back pain or fever, but prior to the change,  she usually has a little bit more frequency than usual.  She does have  an osteoblastic lesion at L4-L5 consistent with lymphoma.  She has also  had phlebitis in December 2005 and April 2006.  For that reason, she is  on Coumadin.  Also, Antivert, Celexa, Flomax, Wellbutrin, and Xanax.  She has allergies to penicillin and cephalosporins.  She enters for  change of the stent at this time.   DESCRIPTION OF PROCEDURE:  The patient was placed on the operating table  in the dorsal lithotomy position, given IV Cipro, prepped and draped in  the usual sterile fashion.  The cystoscope was inserted and the bladder  inspected.  No lesions were seen.  The stent was coming from the left  ureteral orifices and grasped with grasping forceps and pulled out just  beyond the urethral meatus.  There was a little bit of encrustation of  the bladder end of the stent, but there was urine that was dripping out  of it.  Under fluoroscopy, a guidewire was then passed up to the level  of the kidney and the stent was then  removed.  Over the guidewire, the  cystoscope was back loaded and a new 7 French x 26 cm length double J  ureteral stent was inserted and as the stent was removed, there was a  good coil in the renal pelvis and one in the bladder.  This was adjusted  slightly with grasping forceps.  The bladder was drained, she was given  a B&O suppository, and she was taken to the recovery room in good  condition.   I think since this went so well and it has been seven months since her  last change, I think we could try to go nine months pending her clinical  status.  I will have her call and set this up a few months ahead of  time.      Courtney Paris, M.D.  Electronically Signed     HMK/MEDQ  D:  10/20/2006  T:  10/20/2006  Job:  [id number]

## 2010-08-05 NOTE — Op Note (Signed)
Angela, Bray           ACCOUNT NO.:  [id number]   MEDICAL RECORD NO.:  [id number]          PATIENT TYPE:  AMB   LOCATION:  NESC                         FACILITY:  [facility]   PHYSICIAN:  Courtney Paris, M.D.DATE OF BIRTH:  05-Oct-1945   DATE OF PROCEDURE:  07/11/2008  DATE OF DISCHARGE:                               OPERATIVE REPORT   PREOPERATIVE DIAGNOSES:  1. Left hydronephrosis.  2. Large cell lymphoma.   POSTOPERATIVE DIAGNOSES:  1. Left hydronephrosis.  2. Large cell lymphoma.   OPERATION:  1. Cystoscopy.  2. Exchange left ureteral stent.   ANESTHESIA:  General.   SURGEON:  Courtney Paris, M.D.   BRIEF HISTORY:  This 65 year old female presents with left ureteral  obstruction secondary to retroperitoneal fibrosis from a large B-cell  lymphoma.  She was first stented in [place] in July 2005.  She has had  these stents changed every 3-6 months.  The stent was last changed in  October 2009.  She is on Coumadin for chronic left thrombophlebitis.  She has an osteoblastic lesion at L4-5 consistent with lymphoma.  She  had knee surgery in 2001, had the left total knee recently and is due to  have her right knee done in October 2010.   DESCRIPTION OF THE OPERATION:  The patient was placed on the operating  table in dorsal lithotomy position.  After satisfactory induction of  general anesthesia she was prepped and draped with Betadine in the usual  sterile fashion.  She was given IV Cipro.  Time-out was then performed  and the patient and procedure were then reconfirmed.   The 21 panendoscope was inserted and the bladder inspected.  It was free  of any mucosal lesions.  The stent was seen coming out the left ureteral  orifice and it was only slightly encrusted.  This was grabbed with  grasping forceps and pulled outside the urethral meatus.  Under  fluoroscopy a guidewire was then passed up the stent to the kidney as  the stent was then removed.  Over  the guidewire a new 7-French x 26-cm  length double-J ureteral stent was passed with a nice coil in the renal  pelvis.  The stent was then removed and adjusted slightly with the  cystoscope under direct vision.   The bladder was drained, the B and O suppository inserted.  The patient  was taken to the recovery room in good condition.  She will have her  stent changed again probably in September 2010 prior to her anticipated  right total knee arthroplasty, which is scheduled for October.      Courtney Paris, M.D.  Electronically Signed     HMK/MEDQ  D:  07/11/2008  T:  07/11/2008  Job:  [id number]

## 2010-08-05 NOTE — Op Note (Signed)
[name], [name]           ACCOUNT NO.:  [id number]   MEDICAL RECORD NO.:  [id number]          PATIENT TYPE:  AMB   LOCATION:  [facility]                         FACILITY:  [facility]   PHYSICIAN:  Courtney Paris, M.D.DATE OF BIRTH:  September 05, 1945   DATE OF PROCEDURE:  01/18/2008  DATE OF DISCHARGE:                               OPERATIVE REPORT   .  Using 70 dB.   PREOPERATIVE DIAGNOSES:  Left hydronephrosis secondary to be B-cell  lymphoma.   POSTOPERATIVE DIAGNOSES:  Left hydronephrosis secondary to be B-cell  lymphoma.   OPERATION:  Cystoscopy and exchange of left ureteral stent.   ANESTHESIA:  General.   SURGEON:  Courtney Paris, M.D.   BRIEF HISTORY:  This 65 year old white female presented with left  hydronephrosis secondary to a retroperitoneal mass due to B-cell  lymphoma since July, 2005.  She has had a stent inserted first in [place] at that time and it is changed every 3-6 months.  The last one  however, was 8 months in duration and was obstructed.  This was changed  in April.  She comes in now to exchange the stent 6 months after it was  last place.  She is on Coumadin for chronic left thrombophlebitis and  has an osteoblastic lesion at L4-5, consistent with lymphoma.  She did  not stop her Coumadin and has had no pressure or pain in her back since  the stent has been changed.   The patient was placed on the operating room table in dorsal lithotomy  position.  After satisfactory induction of general anesthesia she was  prepped and draped with Betadine.  She got IV Cipro.  A time-out was  then performed and the patient and the procedure were then reconfirmed.  The 21 panendoscope was inserted.  The stent was seen and grasped and  pulled outside the urethral meatus.  The very end of the stent was  obstructed, but with my fingers I could wipe off the stones and then  there was hydronephrotic drip present.  Through the open end of the  stent I passed a  guidewire under fluoroscopy up to the level of the  kidney.  It did not seem to go through the  proximal end of the stent  and the stent was then pulled out and removed.  Over the guidewire, also  under fluoroscopy a new 7-French x 26 cm length double-J ureteral stent  was then passed and as the guidewire was  removed it seemed like there  was a little bit too much length in the bladder.  I tried to insert the  stent with the grasping forceps a little further, but was unable to do  so.  I grasped the stent in the bladder and pulled it outside the  urethral meatus, re-passed the guidewire under fluoroscopy and this time  was able to get the stent up a little bit higher.  I removed the  guidewire.  There was a nice coil in the renal pelvis and one in the  bladder and this was adjusted slightly with  grasping forceps.  The bladder was  drained.  The B and O suppository  inserted and the patient taken to the recovery room in good condition.  She will be later discharged as an outpatient.  She will have the stent  changed again in 6 months.      Courtney Paris, M.D.  Electronically Signed     HMK/MEDQ  D:  01/18/2008  T:  01/18/2008  Job:  [id number]

## 2010-08-05 NOTE — Assessment & Plan Note (Signed)
[name]                         GASTROENTEROLOGY OFFICE NOTE   [name], [name]                  MRN:          [id number]  DATE:08/18/2006                            DOB:          1945/05/10    [name] is a 65 year old white female whom we saw in the past for  flexible sigmoidoscopy in 2000 and a full colonoscopy November of 2002  with findings of hyperplastic left colon.  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  Dalene Carrow.  Most recent CT scan of the chest and abdomen on Aug 02, 2006  showed no evidence of recurrent disease, other than the old disease in  the distal abdominal aorta, which appears as a mild soft tissue  thickening.  She has a left ureteral stent because of fibrosis around  her left ureter.  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  She was taken off  Coumadin for dental procedures.  She is currently not having any GI  issues.   MEDICATIONS:  1. Coumadin 8.5 mg p.o. nightly  2. Alavert 1 daily.  3. Flonase 2 puffs daily.  4. Celexa 20 mg p.o. daily.  5. Wellbutrin XL 300 mg p.o. daily.  6. Xanax 1 mg p.o. daily.  7. Prilosec OTC.   PAST HISTORY:  Significant for depression, non-Hodgkin's lymphoma in  2006, arthritis, anxiety, operation exploratory abdominal surgery in  2005, and stents placed in left ureter.   FAMILY HISTORY:  Positive for heart disease.  No family history of colon  cancer.   SOCIAL HISTORY:  Married with 3 children.  She is a Arts development officer.  She  smokes 2 packs a day.  Does not drink alcohol.   REVIEW OF SYSTEMS:  Positive for arthritic complaints and  musculoskeletal pain.   PHYSICAL EXAM:  Blood pressure 130/86, pulse 68, and weight 205 pounds.  The patient was not examined.   IMPRESSION:  38. A 66 year old white female with a history of hyperplastic polyp of      the  colon, due for repeat colonoscopy.  In the interim, she has had      lymphoma and chemotherapy.  She is currently asymptomatic and in      remission.  2. Coumadinization because of deep vein thrombosis.  The patient was      able to get off of Coumadin for a short period of time.   PLAN:  1. We have discussed prep for colonoscopy, which would include      OsmoPrep.  She will discontinue her Coumadin 3 days prior to the      procedure and, hopefully, will be able to get back on her Coumadin      the same day or the next day after the procedure.  2. We have discussed the prep.  She prefers the pills rather than a      large volume of liquid.  All other medications will be continued.     [name]. Juanda Chance, MD  Electronically Signed    DMB/MedQ  DD:  08/18/2006  DT: 08/18/2006  Job #: [id number]   cc:   Vicente Serene I. Odogwu, M.D.

## 2010-08-05 NOTE — Op Note (Signed)
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   OPERATION:  Cystoscopy, exchange of left ureteral stent.   ANESTHESIA:  General.   SURGEON:  Courtney Paris, M.D.   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  The first stent was inserted in [place] in July 2005.  She has had stent changed every 3-6 months since then, the last one was  in April 2010.  She had a left total knee and is due to have the right  side done in October.  She is on Coumadin for atrial fib and this was  discontinued 3 days before the procedure.   DESCRIPTION OF PROCEDURE:  The patient was placed on the operating table  in dorsal lithotomy position.  After satisfactory induction of general  anesthesia, was prepped and draped with Betadine in the usual sterile  fashion.  She was given IV Cipro.  Time-out was then performed and the  patient and procedure then reconfirmed.  The 21 panendoscope was  inserted in the bladder which was carefully inspected.  No bladder  mucosal lesions were seen.  The stent was coming from the left orifice,  was mildly encrusted with stone and seemed to be intact.  Using  fluoroscopy, the kidney end of the stent was noted.  The bladder and was  pulled out with a grasping forcep outside the urethral meatus and a  Sensor guidewire was then passed under fluoroscopy up to and into the  kidney as the stent  was then removed.  Over the guidewire a new 7 French  x 26 cm stent was then passed under fluoroscopy with a nice coil in the  renal pelvis and one in the bladder.  The cystoscope was then reinserted  and the distal end pulled out slightly to adjust the stent.  The bladder  was drained, B and O suppository inserted.  The patient taken to the  recovery room in good condition to be later discharged as an outpatient,  to be started back on her Coumadin tonight.      Courtney Paris, M.D.  Electronically Signed     HMK/MEDQ  D:  11/13/2008  T:  11/13/2008  Job:  [id number]

## 2010-08-08 NOTE — Op Note (Signed)
[name], [name]           ACCOUNT NO.:  [id number]   MEDICAL RECORD NO.:  [id number]          PATIENT TYPE:  AMB   LOCATION:  [facility]                         FACILITY:  [facility]   PHYSICIAN:  Claudette Laws, M.D.  DATE OF BIRTH:  03-Jan-1946   DATE OF PROCEDURE:  06/11/2005  DATE OF DISCHARGE:                                 OPERATIVE REPORT   PREOPERATIVE DIAGNOSIS:  1.  Chronic indwelling left ureteral stent.  2.  History of a retroperitoneal lymphoma.  3.  History of a ureteral stricture secondary to #2.   POSTOP DIAGNOSIS:  1.  Chronic indwelling left ureteral stent.  2.  History of a retroperitoneal lymphoma.  3.  History of a ureteral stricture secondary to #2.   OPERATIONS:  1.  Cystoscopy and removal of double-J stent.  2.  Left retrograde, pyeloureterogram.  3  Insertion of a 7-French, 26 cm, double-J stent [facility])   PROCEDURE:  The patient was prepped and draped in the dorsolithotomy  position under LMA anesthesia. She was given a B&O suppository for  anesthetic purposes. Cystoscopy was performed with a 22-French rigid  cystoscope. The bladder itself showed the stent in place, smooth bladder  otherwise, no tumor, no calculi, not much incrustation.   Initially I brought out the double-J stent out the urethra; and then passed  up to a sensor guidewire using fluoroscopic control. I then removed the  double-J stent and under direct vision passed up to a 6-French, open-ended,  ureteral catheter and then performed retrograde studies.  A left retrograde  pyeloureterogram showed no extravasation. The ureter seemed to be of normal  caliber. The kidney, itself, showed a mild-to-moderate hydronephrosis with  some chronic changes, but no obvious filling defects, no obvious stones.   I then removed the open-ended catheter and back loaded the guidewire over  the cystoscope; and under direct vision passed a 7-French, 26 cm, Contour  catheter; again, using  fluoroscopic control. The proximal end was curled up  in the kidney. The distal end was curled up in the bladder. I removed the  guidewire.  The bladder was emptied. All instruments were removed; and the  patient was then taken back to the recovery room in satisfactory condition.      Claudette Laws, M.D.  Electronically Signed     RFS/MEDQ  D:  06/11/2005  T:  06/12/2005  Job:  [id number]

## 2010-08-08 NOTE — Op Note (Signed)
NAMEATHIRA, JANOWICZ           ACCOUNT NO.:  [id number]   MEDICAL RECORD NO.:  [id number]          PATIENT TYPE:  AMB   LOCATION:  [facility]                         FACILITY:  [facility]   PHYSICIAN:  Claudette Laws, M.D.  DATE OF BIRTH:  03/16/1946   DATE OF PROCEDURE:  08/07/2004  DATE OF DISCHARGE:                                 OPERATIVE REPORT   PREOPERATIVE DIAGNOSIS:  History of left hydronephrosis secondary to a left  ureteral obstruction secondary to a retroperitoneal lymphoma.   POSTOPERATIVE DIAGNOSES:  History of left hydronephrosis secondary to a left  ureteral obstruction secondary to a retroperitoneal lymphoma.   OPERATION:  1.  Cystoscopy.  2.  Exchange of left ureteral double-J stent and a left retrograde      pyeloureterogram.   DESCRIPTION OF PROCEDURE:  The patient was prepped and draped in the dorsal  lithotomy position under LMA anesthesia.  Cystoscopy was performed with a 22  French cystoscope and normal bladder, somewhat of a calcified left ureteral  stent, no obvious stones in the bladder.   Initially, I removed the end of the double-J stent with graspers and then  passed up a 0.038 guidewire using fluoroscopic control.  The wire went up  nicely into the kidney, and then I removed the double-J stent.  I then  passed up a 7 French 26 cm double-J stent using fluoroscopic control after  we backloaded the wire through the cystoscope.  This seemed to curl up  nicely in the kidney.  The distal end was curled up in the bladder.   We then performed a left retrograde pyeloureterogram.  I passed up a 5  [place] acorn-tip catheter alongside the stent without any difficulty.  The  retrograde study was done.  There was no extravasation along the ureter.  The kidney seemed to be draining well.  There was a fairly well-outlined  caliceal system, only minimal, if any, hydronephrosis, as expected.  But no  obvious filling defects.   We then took the appropriate pictures,  emptied the bladder.  A B&O  suppository was placed for anesthetic purposes, and she was taken back to  the recovery room in satisfactory condition.   Incidentally, this lady has two more rounds of chemotherapy left before some  radiation therapy is planned.      RFS/MEDQ  D:  08/07/2004  T:  08/07/2004  Job:  [id number]

## 2010-08-08 NOTE — Op Note (Signed)
[name], [name]           ACCOUNT NO.:  [id number]   MEDICAL RECORD NO.:  [id number]          PATIENT TYPE:  AMB   LOCATION:  [facility]                         FACILITY:  [facility]   PHYSICIAN:  Claudette Laws, M.D.  DATE OF BIRTH:  1946-03-17   DATE OF PROCEDURE:  03/20/2004  DATE OF DISCHARGE:                                 OPERATIVE REPORT   PREOPERATIVE DIAGNOSES:  1.  Left hydronephrosis with ureteral obstruction secondary to a lymphoma.  2.  Longstanding indwelling double J stent.   POSTOPERATIVE DIAGNOSES:  1.  Left hydronephrosis with ureteral obstruction secondary to a lymphoma.  2.  Longstanding indwelling double J stent.   OPERATION:  Cystoscopy and exchange of a double J stent, 7 French x 26 cm.   SURGEON:  Claudette Laws, M.D.   HISTORY:  This is a 65 year old lady recently diagnosed with a lymphoma in  the retroperitoneal area with obstruction of the left ureter.  Back in  September we put up a double J stent.  It is time for an exchange.  She is  to undergo chemotherapy starting next week.  Also, she has a CT scan  scheduled for tomorrow.   PROCEDURE:  The patient was prepped and draped in the dorsal lithotomy  position under LMA anesthesia.  Cystoscopy was performed.  The stent was  removed partially with grabbers.  I then passed up a 0.038 guidewire with  fluoroscopic control.  We then removed the double J stent and then back  loaded the guidewire through a cystoscope and then passed up a 7 [place], 26  cm double J stent again using fluoroscopic control.  The proximal end was  curled up in the kidney and the distal end was curled up in the bladder.  The bladder was emptied.  She was given a B&O suppository for anesthetic  purposes and then taken back to the recovery room in satisfactory condition.     Rona   RFS/MEDQ  D:  03/20/2004  T:  03/20/2004  Job:  [id number]

## 2010-08-08 NOTE — Op Note (Signed)
NAMEDEMONICA, [name]                       ACCOUNT NO.:  [id number]   MEDICAL RECORD NO.:  [id number]                   PATIENT TYPE:  INP   LOCATION:  0001                                 FACILITY:  [facility]   PHYSICIAN:  Claudette Laws, M.D.               DATE OF BIRTH:  11-29-1945   DATE OF PROCEDURE:  12/06/2003  DATE OF DISCHARGE:                                 OPERATIVE REPORT   PREOPERATIVE DIAGNOSES:  1.  Left retroperitoneal soft tissue mass, rule out lymphoma, rule out      retroperitoneal fibrosis.  2.  Left ureteral obstruction and hydronephrosis secondary to #1.   POSTOPERATIVE DIAGNOSES:  1.  Left retroperitoneal soft tissue mass, rule out lymphoma, rule out      retroperitoneal fibrosis.  2.  Left ureteral obstruction and hydronephrosis secondary to #1.   OPERATION:  1.  Cystoscopy and insertion of a 7 French 26 cm double-J stent.  2.  Left retroperitoneal exploration with biopsy soft tissue mass.   SURGEON:  Dr. Windy Fast Sural   ASSISTANT:  Dr. Avel Peace   DESCRIPTION OF PROCEDURE:  The patient was prepped and draped in the dorsal  lithotomy position under general anesthesia.  Cystoscopy was performed with  a 20 French rigid cystoscope.  The bladder was emptied.  The bladder itself  looked normal, no stones, no tumors.   I passed up a 0.038 guidewire removable core up the ureter without any  difficulty and then over this I passed the 7 French 26 cm double-J stent  without incident.  It went up nicely.  The distal end was curled in the  bladder.  An 46 French Foley catheter was hooked to a drainage bag.   The patient was then re-prepped in the supine position with a roll beneath  her lower back.  A Gibson-type incision was made in the left lower quadrant.  Dissection was carried down through the rectus fascia.  The rectus muscle  was retracted laterally and then with blunt dissection, we worked our way  down retroperitoneally.  However, once we got  over the iliac vessels, it  became apparent that both the iliac vessels right at the __________ line and  also over the ureter were encased in dense tissue.  At this point, we really  could not mobilize the vessels as we would have liked.  There was a large  lymph node above the iliac vessels, and two nodes were submitted for  pathologic examination.  We then worked our way medially, identified the  psoas muscle.  There was some fatty-type tissue, and this was what we  excised and sent for permanent sections.  In any event, our exposure was  somewhat disappointing in that both the iliacs and the ureter were encased  in dense encasement, making outline of the anatomy somewhat difficult.  Hopefully, however, we did obtain enough tissue for the pathologist.  There  was a  small area of bleeding  where one of the lymph nodes was excised over the iliac vein, and this was  covered with Surgicel.  We then irrigated out the opening with saline and  then closed the rectus fascia with #1 PDS.  The skin was approximated with  skin staples.  The patient was taken back to the recovery room in  satisfactory condition.                                               Claudette Laws, M.D.    RFS/MEDQ  D:  12/06/2003  T:  12/06/2003  Job:  [id number]

## 2010-08-08 NOTE — Op Note (Signed)
[name], [name]           ACCOUNT NO.:  [id number]   MEDICAL RECORD NO.:  [id number]          PATIENT TYPE:  AMB   LOCATION:  [facility]                         [facility]AC[facility]   PHYSICIAN:  Claudette Laws, M.D.  DATE OF BIRTH:  February 18, 1946   DATE OF PROCEDURE:  03/05/2005  DATE OF DISCHARGE:                                 OPERATIVE REPORT   PREOPERATIVE DIAGNOSIS:  Chronic indwelling left ureteral stent.   POSTOPERATIVE DIAGNOSIS:  Chronic indwelling left ureteral stent.   OPERATION:  Cystoscopy and exchange of a double-J stent.   SURGEON:  Dr. Etta Grandchild   PROCEDURE:  The patient was prepped and draped in the dorsolithotomy  position under LMA anesthesia.  Cystoscopy was performed with a 22-French  rigid cystoscope to the normal bladder other than the indwelling stent which  was encrustated.  Using alligator forceps, I removed the double-J stent  partially out to the meatus, and then I passed up a 0.038 [place] guidewire  using fluoroscopic control.  I then positioned the guidewire in the kidney,  then removed the double-J stent and then back loaded the guidewire through  the cystoscope, and then I passed up another 7-French 26 cm double-J stent  without any difficulty, again using fluoroscopic control.  The proximal end  was curled up in the kidney.  The distal end was curled up in the bladder.  The bladder was emptied.  All instruments were removed, and the patient was  then taken back to the recovery room in satisfactory condition.  We did  submit bladder urine for urine culture.      Claudette Laws, M.D.  Electronically Signed     RFS/MEDQ  D:  03/05/2005  T:  03/05/2005  Job:  [id number]

## 2010-08-08 NOTE — Op Note (Signed)
[name], [name]           ACCOUNT NO.:  [id number]   MEDICAL RECORD NO.:  [id number]          PATIENT TYPE:  AMB   LOCATION:  [facility]                         [facility]ACILITY:  [facility]   PHYSICIAN:  Houston M. Kimbrough, M.D.DATE OF BIRTH:  1945-09-05   DATE OF PROCEDURE:  04/21/2006  DATE OF DISCHARGE:                               OPERATIVE REPORT   PREOPERATIVE DIAGNOSIS:  Left hydronephrosis secondary to  retroperitoneal mass from large B-cell lymphoma.   POSTOPERATIVE DIAGNOSIS:  Left hydronephrosis secondary to  retroperitoneal mass from large B-cell lymphoma.   OPERATION:  Cystoscopy and exchange of left ureteral stent.   ANESTHESIA:  General.   SURGEON:  Courtney Paris, M.D.   BRIEF HISTORY:  This 65 year old patient presented with left  hydronephrosis secondary to a retroperitoneal mass from large B-cell  lymphoma first found and stented July 2005 by Dr. Etta Grandchild.  It has been  changed every 3-6 months since then; the last time was August 2007.  The  last chemotherapy was July 2006.  She has done well.  She completed her  radiotherapy August 2007.  She has an atrophic left kidney with 22%  function, an osteoblastic lesion at L4-5 and history of left thigh  thrombophlebitis x2 and is on Coumadin.  She has allergies to penicillin  and cephalosporin.   The patient was brought in for cystoscopic exchange of the left ureteral  stent.  She was put under general anesthesia in a satisfactory manner,  prepped and draped in the usual sterile fashion and given IV Cipro.  The  bladder was carefully inspected using the right-angle a 4 black lens.  The stent was seen coming from the left ureteral orifice and was  somewhat encrusted in the bladder.  No bladder mucosal lesions were  seen.  The end of the stent was grasped and pulled outside the urethral  meatus and then a regular guidewire was passed up the stent without  difficulty up to the level of the kidney.  The stent was then  removed.  The guidewire was then back-loaded on a cystoscope and a new 7-French x  26-cm-length double-J ureteral stent was then passed up to the kidney,  guidewire removed and the stent seemed to coil in the renal pelvis and  in the bladder in a satisfactory manner.  The bladder was drained and  scope removed.  The patient was taken to the recovery room in good  condition to be later discharged as an outpatient.  Will plan to  exchange the stent again in another 6 months.      Courtney Paris, M.D.  Electronically Signed     HMK/MEDQ  D:  04/21/2006  T:  04/21/2006  Job:  [id number]

## 2010-08-08 NOTE — Op Note (Signed)
NAMEIAN, CAVEY           ACCOUNT NO.:  [id number]   MEDICAL RECORD NO.:  [id number]          PATIENT TYPE:  AMB   LOCATION:  [facility]                         FACILITY:  [facility]   PHYSICIAN:  Claudette Laws, M.D.  DATE OF BIRTH:  1945/04/08   DATE OF PROCEDURE:  10/27/2005  DATE OF DISCHARGE:  10/20/2005                                 OPERATIVE REPORT   PREOPERATIVE DIAGNOSES:  1.  Chronic left hydronephrosis.  2.  Chronic indwelling double-J stent.  3.  History of a retroperitoneal lymphoma.   POSTOPERATIVE DIAGNOSES:  1.  Chronic left hydronephrosis.  2.  Chronic indwelling double-J stent.  3.  History of a retroperitoneal lymphoma.   OPERATION:  Cystoscopy and removal of double-J stent and replacement with a  new 7-French 26 cm double-J stent and retrograde pyeloureterogram.   SURGEON:  Claudette Laws, M.D.   PROCEDURE:  The patient was prepped and draped in the dorsolithotomy  position under LMA anesthesia.  Cystoscopy was performed with a 22-French  cystoscope.  The bladder was grossly normal and had no obvious tumors or  stones.  She was noted to have some encrustation of the prior indwelling  double-J stent.  Using grabber forceps.  I removed the end of the stent out  to the meatus and then I passed up a sensor guidewire using fluoroscopic  control.  We then passed a guidewire up through the stent into the kidney,  removed the double-J stent and then back loaded the guidewire over the  cystoscope and then under direct vision and using fluoroscopic control, we  passed up a new 7-French 26 cm Contour ureteral stent without incident.  I  then removed the guidewire and then passed up an open-ended ureteral  catheter alongside the stent and then performed retrograde pyelograms.   She appeared to have a normal ureter and no obvious filling defects.  Again  she was noted to have dilated the calyces both superiorly and inferiorly.  There were no obvious filling defects on  the retrograde studies.   I then emptied her bladder and she was taken back to the recovery room in  satisfactory condition.      Claudette Laws, M.D.  Electronically Signed     RFS/MEDQ  D:  10/27/2005  T:  10/27/2005  Job:  [id number]

## 2010-08-08 NOTE — Op Note (Signed)
[name], [name]           ACCOUNT NO.:  [id number]   MEDICAL RECORD NO.:  [id number]          PATIENT TYPE:  AMB   LOCATION:  [facility]                         FACILITY:  [facility]   PHYSICIAN:  Claudette Laws, M.D.  DATE OF BIRTH:  22-Dec-1945   DATE OF PROCEDURE:  11/27/2004  DATE OF DISCHARGE:                                 OPERATIVE REPORT   PREOPERATIVE DIAGNOSES:  1.  Chronic indwelling left renal stent for left hydronephrosis.  2.  History of lymphoma status post chemotherapy and radiation therapy.   POSTOPERATIVE DIAGNOSES:  1.  Chronic indwelling left renal stent for left hydronephrosis.  2.  History of lymphoma status post chemotherapy and radiation therapy.   OPERATIONS:  Cystoscopy and exchange of double-J stent left kidney.   SURGEON:  Claudette Laws, M.D.   INDICATIONS:  This is a 65 year old lady who was found to have a lymphoma in  the retroperitoneum last year with hydronephrosis.  She has had a double-J  stent in now for over a year.  She recently finished up her chemotherapy and  radiation therapy up.  She is going out of town at the end of this month,  and we would like to keep this 10 an for a few more weeks before taken to  note. It has been noted, however, in the interim that she has lost some  renal parenchyma. Clinically though she is doing well.  She is on  anticoagulation therapy with only intermittent bleeding.   DESCRIPTION OF PROCEDURE:  The patient was prepped and draped in the dorsal  lithotomy position under LMA anesthesia.  She was given Cipro 400 mg IV  preop.  A #22-French cystoscope was placed into the bladder using alligator  forceps.  I removed the end of the double-J stent and then passed up a 0.038  guidewire using fluoroscopic control. We pulled out the stent, however, it  was noted that the guidewire had curled up in the bladder so we had to  remove the wire and then using a 6-French open-ended ureteral catheter  intubated the left  ureter without any problems, passed up the guidewire  again using fluoroscopic control.  I then removed the open-ended catheter  and passed up to a 7-French 26 cm double-J stent without any problems.  It  was curled up in the kidney.  The distal end was curled up in the bladder.  The guidewire was removed.  The bladder was emptied and the patient was  taken back to the recovery room in satisfactory condition.      Claudette Laws, M.D.  Electronically Signed     RFS/MEDQ  D:  11/27/2004  T:  11/27/2004  Job:  [id number]

## 2010-08-08 NOTE — Op Note (Signed)
[name], [name]                       ACCOUNT NO.:  [id number]   MEDICAL RECORD NO.:  [id number]                   Bray TYPE:  AMB   LOCATION:  [facility]                                 FACILITY:  [facility]   PHYSICIAN:  Claudette Laws, M.D.               DATE OF BIRTH:  [birth year]   DATE OF PROCEDURE:  10/25/2003  DATE OF DISCHARGE:                                 OPERATIVE REPORT   PREOPERATIVE DIAGNOSIS:  1. Left ureteral obstruction.  2. Left hydronephrosis.   POSTOPERATIVE DIAGNOSIS:  1. Left ureteral obstruction.  2. Left hydronephrosis.   PROCEDURES PERFORMED:  1. Cystoscopy.  2. Bilateral retrograde ureteral pyelography with interpretation.  3. Left ureteroscopy.  4. Left selective cytology.  5. Left ureteral biopsy.  6. Left 6 x 26 double-J stent placement.   SURGEON:  Claudette Laws, M.D.   RESIDENT SURGEON:  Thyra Breed, MD   ANESTHESIA:  LMA.   DRAINS:  Left ureteral 6 x 26 soft-flex double-J stent.   SPECIMENS:  1. Left ureteral biopsy.  2. Left selective cytology.   DESCRIPTION OF FINDINGS:  Angela bladder appeared normal cystoscopically.  Left  right ureteral pyelography demonstrated an approximately 2 cm segment in Angela  mid-to-distal left ureter of narrowing in a concentric fashion with hydro  proximal to this area.  Ureteroscopy demonstrated no gross evidence of tumor  within this segment of ureter; however, it was quite narrow through this  segment.  It was difficult to discern if this was due to intrinsic disease  of Angela ureter versus extrinsic compression.   COMPLICATIONS:  None.   INDICATIONS FOR PROCEDURE:  [name] is a 65 year old female seen in  consultation for a left-sided abdominal pain.  A CT scan had demonstrated  left-sided hydronephrosis.  Bray had an indwelling double-J stent placed  on Angela left with some relief of her abdominal pain.  Angela stent was recently  removed, and [name] had a follow-up CT scan which  demonstrated  narrowing of Angela mid-to-distal left ureter with surrounding possible soft  tissue compression around Angela area over Angela psoas.  She is consented on  cystoscopy, ureteroscopy, retrograde pyelography, as well as possible biopsy  of her ureter in order to further delineate Angela process causing her  hydronephrosis on Angela left.  Informed consent has been obtained.   PROCEDURE IN DETAIL:  Following identification by her arm bracelet, Angela  Bray was brought to Angela operating room and placed in Angela supine position.  She then underwent successful induction of LMA anesthesia and received  preoperative antibiotics.  She was then moved to Angela dorsal lithotomy  position.  Her perineum and genitalia were then prepped and draped with  Betadine in Angela usual sterile fashion.  We initially placed a 12 degree  cystoscopic lens through a 22.5 [place] sheath.  Angela urethra appeared within  normal limits.  Upon entry into Angela bladder, both Angela right and  left  ureteral orifice were in their normal anatomic location, effluxing clear  urine bilaterally.  Further cystoscopic examination of Angela bladder revealed  no other evidence of mucosal irregularity, papillary tumors, foreign bodies,  or stones.  We then inserted a cone-tipped catheter through Angela working port  of Angela cystoscope and gently cannulated Angela left ureteral orifice.  Using  half-strength Cysto-Conray, we then gently injected Angela left ureteral  orifice and performed left right ureteral pyelography with interpretation.  Angela contrast easily filled Angela distal third of Angela ureter until it met  resistance at Angela known area of stricture/obstruction.  We continued to fill  Angela ureter until this concentrically narrowed area was bypassed to reveal  hydroureteronephrosis  proximal to this 2 cm segment of strictured ureter.  Following this, we elected to perform retrograde ureteral pyelography.  In a  similar fashion, Angela right ureteral orifice was  gently cannulated, and half-  strength Cysto-Conray injected under direct fluoroscopic guidance to reveal  no mucosal irregularities or filling defects within Angela right collecting  system.  At this time, we inserted a 0.38 angled glide wire through Angela  working port of Angela cystoscope and gently cannulated Angela left ureteral  orifice.  Under direct fluoroscopic guidance, Angela glide wire was easily  passed to Angela level of Angela left renal collecting system under direct  guidance.  Once good curl was confirmed with Angela wire, Angela cystoscope was  removed, and Angela wire fixed to [name]'s drape.  Angela semi-rigid  ureteroscope, using saline as an irrigant, was then used to gently traverse  Angela left ureteral orifice and ascend to Angela area of narrowing.  Angela  ureteroscope was able to be passed through this area of narrowing with some  resistance.  Proximal to Angela strictured area, there was dilated ureter, as  previously demonstrated on Angela retrograde studies.  We then took several  pictures of this narrowed area and sent saline-selective cytology of this  area.  Using ureteral biopsy forceps, we then took three biopsies of Angela  ureter in this area and sent them for further pathologic analysis.  There  was no gross papillary tumor present; however, this area was concentrically  narrowed, either from an intramural process or extrinsic compression of  unknown etiology at this time.  There was minimal bleeding from Angela biopsy  sites, and Angela semirigid ureteroscope was removed.  At this time, we even  back-loaded Angela glide wire over Angela cystoscope and placed a left-sided 6 x  26 double-J ureteral stent under direct cystoscopic and fluoroscopic  guidance.  Once Angela proximal end of Angela stent was in Angela left renal  collecting system, Angela wire was removed, and there was good fluoroscopic  evidence of curl within Angela left renal pelvis, and cystoscopically, Angela curl could be seen distally within Angela bladder.   Angela bladder was then drained,  and Angela procedure was terminated.  [name] tolerated Angela procedure well,  and there were no complications.  Dr. Etta Grandchild was present and participated in  Angela entire procedure, as he was Angela responsible surgeon.   DISPOSITION:  After awakening from general anesthesia, [name] was  transported to Angela post anesthesia care unit in stable condition.  From  here, she will be discharged to home.     Thyra Breed, MD                            Claudette Laws,  M.D.    Elodia Florence  D:  10/25/2003  T:  10/25/2003  Job:  [id number]

## 2010-08-12 ENCOUNTER — Encounter (HOSPITAL_BASED_OUTPATIENT_CLINIC_OR_DEPARTMENT_OTHER): Payer: Medicare Other | Admitting: Hematology and Oncology

## 2010-08-12 ENCOUNTER — Other Ambulatory Visit: Payer: Self-pay | Admitting: Hematology and Oncology

## 2010-08-12 DIAGNOSIS — Z86718 Personal history of other venous thrombosis and embolism: Secondary | ICD-10-CM

## 2010-08-12 DIAGNOSIS — Z7901 Long term (current) use of anticoagulants: Secondary | ICD-10-CM

## 2010-08-12 LAB — PROTIME-INR: INR: 2.5 (ref 2.00–3.50)

## 2010-08-26 ENCOUNTER — Telehealth: Payer: Self-pay | Admitting: Cardiology

## 2010-08-26 NOTE — Telephone Encounter (Signed)
Walk In pt Form " Pt Dropped off papers form completion from Dr.Luis M. Benitz DDS" sent to [employer]  08/26/10/km

## 2010-09-01 ENCOUNTER — Telehealth: Payer: Self-pay | Admitting: Cardiology

## 2010-09-01 NOTE — Telephone Encounter (Signed)
I will forward this message to [employer].  This form is located on the top of Dr Alford Highland cart for review on 09/02/10.  Pt's dental procedure is scheduled on 09/03/10.  Pt aware that MD will be in the office on Tuesday.

## 2010-09-01 NOTE — Telephone Encounter (Signed)
Pt dropped off a form to be completed so she can have some dental work done and the office still has not heard anything so she was calling to f/u

## 2010-09-02 NOTE — Telephone Encounter (Signed)
Form faxed back to Dr Russella Dar. Per Dr Wilmon Pali for periodontal surgery as long as she is stable  symptomatically. I talked with pt and she states she is doing OK. Original form to HIM.

## 2010-09-08 ENCOUNTER — Other Ambulatory Visit: Payer: Self-pay | Admitting: Family Medicine

## 2010-09-08 NOTE — Telephone Encounter (Signed)
Detailed message left on pt mobile VM that we are out of the office remainder of the week, please check with other provider that originally prescribed Rx and or call back and clarify reason for med.  I also informed her Dr Caryl Never and I are out for the remainder of this week, so she may need to explain this to another nurse, provider

## 2010-09-08 NOTE — Telephone Encounter (Signed)
Pt called and is req script for Oxycodone Ac 5-325 mg

## 2010-09-08 NOTE — Telephone Encounter (Signed)
We need to be clear why she is taking this and also need to confirm that no one else is prescribing.

## 2010-09-08 NOTE — Telephone Encounter (Signed)
This med is only on EMR med list and there was no record of filling it, it was removed from active med list on 04/11/10, Oswald Hillock provider, prescribed for shoulder pain?

## 2010-09-09 ENCOUNTER — Encounter (HOSPITAL_BASED_OUTPATIENT_CLINIC_OR_DEPARTMENT_OTHER): Payer: Medicare Other | Admitting: Hematology and Oncology

## 2010-09-09 ENCOUNTER — Other Ambulatory Visit: Payer: Self-pay | Admitting: Hematology and Oncology

## 2010-09-09 DIAGNOSIS — C8589 Other specified types of non-Hodgkin lymphoma, extranodal and solid organ sites: Secondary | ICD-10-CM

## 2010-09-09 DIAGNOSIS — Z86718 Personal history of other venous thrombosis and embolism: Secondary | ICD-10-CM

## 2010-09-09 DIAGNOSIS — Z7901 Long term (current) use of anticoagulants: Secondary | ICD-10-CM

## 2010-09-09 LAB — PROTIME-INR: Protime: 30 Seconds — ABNORMAL HIGH (ref 10.6–13.4)

## 2010-10-05 ENCOUNTER — Other Ambulatory Visit: Payer: Self-pay | Admitting: Family Medicine

## 2010-10-06 NOTE — Telephone Encounter (Signed)
May refill for 3 months ?

## 2010-10-06 NOTE — Telephone Encounter (Signed)
Last filled 12-17-09, #60 with 3 refills

## 2010-10-15 ENCOUNTER — Other Ambulatory Visit: Payer: Self-pay | Admitting: Hematology and Oncology

## 2010-10-15 ENCOUNTER — Encounter (HOSPITAL_BASED_OUTPATIENT_CLINIC_OR_DEPARTMENT_OTHER): Payer: Medicare Other | Admitting: Hematology and Oncology

## 2010-10-15 DIAGNOSIS — Z86718 Personal history of other venous thrombosis and embolism: Secondary | ICD-10-CM

## 2010-10-15 DIAGNOSIS — Z7901 Long term (current) use of anticoagulants: Secondary | ICD-10-CM

## 2010-10-15 LAB — PROTIME-INR: Protime: 20.4 Seconds — ABNORMAL HIGH (ref 10.6–13.4)

## 2010-10-28 ENCOUNTER — Ambulatory Visit (HOSPITAL_BASED_OUTPATIENT_CLINIC_OR_DEPARTMENT_OTHER)
Admission: RE | Admit: 2010-10-28 | Discharge: 2010-10-28 | Disposition: A | Discharge status: Home / Self Care | Payer: Medicare Other | Source: Ambulatory Visit | Attending: Urology | Admitting: Urology

## 2010-10-28 DIAGNOSIS — C8589 Other specified types of non-Hodgkin lymphoma, extranodal and solid organ sites: Secondary | ICD-10-CM | POA: Insufficient documentation

## 2010-10-28 DIAGNOSIS — Z86718 Personal history of other venous thrombosis and embolism: Secondary | ICD-10-CM | POA: Insufficient documentation

## 2010-10-28 DIAGNOSIS — Z01812 Encounter for preprocedural laboratory examination: Secondary | ICD-10-CM | POA: Insufficient documentation

## 2010-10-28 DIAGNOSIS — Z79899 Other long term (current) drug therapy: Secondary | ICD-10-CM | POA: Insufficient documentation

## 2010-10-28 DIAGNOSIS — N1330 Unspecified hydronephrosis: Secondary | ICD-10-CM | POA: Insufficient documentation

## 2010-10-28 DIAGNOSIS — I2510 Atherosclerotic heart disease of native coronary artery without angina pectoris: Secondary | ICD-10-CM | POA: Insufficient documentation

## 2010-10-28 LAB — BASIC METABOLIC PANEL
CO2: 26 mEq/L (ref 19–32)
Chloride: 107 mEq/L (ref 96–112)
Sodium: 142 mEq/L (ref 135–145)

## 2010-10-28 LAB — PROTIME-INR
INR: 1.57 — ABNORMAL HIGH (ref 0.00–1.49)
Prothrombin Time: 19.1 seconds — ABNORMAL HIGH (ref 11.6–15.2)

## 2010-10-29 LAB — POCT HEMOGLOBIN-HEMACUE: Hemoglobin: 11.2 g/dL — ABNORMAL LOW (ref 12.0–15.0)

## 2010-11-04 ENCOUNTER — Other Ambulatory Visit: Payer: Self-pay | Admitting: Hematology and Oncology

## 2010-11-04 ENCOUNTER — Encounter (HOSPITAL_BASED_OUTPATIENT_CLINIC_OR_DEPARTMENT_OTHER): Payer: Medicare Other | Admitting: Hematology and Oncology

## 2010-11-04 DIAGNOSIS — Z5181 Encounter for therapeutic drug level monitoring: Secondary | ICD-10-CM

## 2010-11-04 DIAGNOSIS — Z7901 Long term (current) use of anticoagulants: Secondary | ICD-10-CM

## 2010-11-04 DIAGNOSIS — Z86718 Personal history of other venous thrombosis and embolism: Secondary | ICD-10-CM

## 2010-11-04 LAB — PROTIME-INR
INR: 1.9 — ABNORMAL LOW (ref 2.00–3.50)
Protime: 22.8 Seconds — ABNORMAL HIGH (ref 10.6–13.4)

## 2010-11-17 ENCOUNTER — Other Ambulatory Visit: Payer: Self-pay | Admitting: Cardiology

## 2010-11-17 NOTE — Op Note (Signed)
  [name], [name]           ACCOUNT NO.:  [id number]  MEDICAL RECORD NO.:  [id number]  LOCATION:                                 FACILITY:  PHYSICIAN:  Jerilee Field, MD        DATE OF BIRTH:  DATE OF PROCEDURE: DATE OF DISCHARGE:                              OPERATIVE REPORT   PREOP DIAGNOSIS: 1. Left hydronephrosis. 2. Left ureteral stent.  POSTOP DIAGNOSIS: 1. Left hydronephrosis. 2. Left ureteral stent.  SURGEON:  Jerilee Field, MD.  TYPE OF ANESTHESIA:  General.  INDICATIONS FOR PROCEDURE:  [name] is a 65 year old female who has had a chronic left stent since 2005 from left ureteral obstruction from malignancy and fibrosis.  She completed chemotherapy and radiation in 2006 and since then stents have been changed approximately every 6 months.  She was recently put on potassium citrate at the last stent change due to stent encrustation.  A KUB revealed the stent was not encrusted prior to this change.  We discussed the nature, risks, benefits, and alternatives to cystoscopy with left ureteral stent placement and stent exchange.  All questions were answered and the patient elected to proceed as above.  FINDINGS:  The stent was minimally encrusted.  The wire easily slid through the stent.  A good coil was seen in the kidney and a good coil in the bladder after the new stent was placed and the wire removed.  DESCRIPTION OF PROCEDURE:  After consent was obtained, the patient was brought to the operating room and a time-out was performed and confirmed the patient and procedure.  Preop antibiotics and SCDs were in place. The patient was prepped and draped in the usual fashion.  A rigid cystoscope was passed per urethra.  The old stent was visualized.  The picture was taken.  Stent was grasped and pulled through the urethral meatus.  Wire was advanced up through the stent into the upper pole of the kidney and the stent removed.  Stent was discarded.  The  wire was then back-loaded on the cystoscope and the 7 x 26 cm stent was placed. The wire was removed and a good coil was noted in the kidney fluoroscopically and a good coil in the bladder cystoscopically.  The new picture was taken of the new stent to give to the patient to reinforce that the potassium citrate therapy is working to keep the stent from encrustation.  The patient's bladder was drained and the scope was removed.  She was then awakened and taken to recovery room in stable condition.  SPECIMENS:  None.  ESTIMATED BLOOD LOSS:  Zero.  DISPOSITION:  The patient is stable to PACU.          ______________________________ Jerilee Field, MD     ME/MEDQ  D:  10/28/2010  T:  10/29/2010  Job:  [id number]  Electronically Signed by Jerilee Field MD on 11/17/2010 10:29:55 AM

## 2010-11-20 ENCOUNTER — Other Ambulatory Visit: Payer: Self-pay | Admitting: Cardiology

## 2010-12-02 ENCOUNTER — Encounter (HOSPITAL_BASED_OUTPATIENT_CLINIC_OR_DEPARTMENT_OTHER): Payer: Medicare Other | Admitting: Hematology and Oncology

## 2010-12-02 ENCOUNTER — Other Ambulatory Visit: Payer: Self-pay | Admitting: Hematology and Oncology

## 2010-12-02 DIAGNOSIS — C8589 Other specified types of non-Hodgkin lymphoma, extranodal and solid organ sites: Secondary | ICD-10-CM

## 2010-12-02 DIAGNOSIS — Z7901 Long term (current) use of anticoagulants: Secondary | ICD-10-CM

## 2010-12-02 DIAGNOSIS — Z86718 Personal history of other venous thrombosis and embolism: Secondary | ICD-10-CM

## 2010-12-02 LAB — PROTIME-INR: INR: 2.4 (ref 2.00–3.50)

## 2010-12-16 LAB — CBC
Platelets: 218
RBC: 4.12
WBC: 4.9

## 2010-12-16 LAB — PROTIME-INR: INR: 1.1

## 2010-12-16 LAB — URINALYSIS, ROUTINE W REFLEX MICROSCOPIC
Bilirubin Urine: NEGATIVE
Ketones, ur: NEGATIVE
Nitrite: NEGATIVE
Specific Gravity, Urine: 1.018
Urobilinogen, UA: 0.2
pH: 5.5

## 2010-12-16 LAB — URINE MICROSCOPIC-ADD ON

## 2010-12-16 LAB — BASIC METABOLIC PANEL
BUN: 14
Chloride: 108
Creatinine, Ser: 0.94
GFR calc Af Amer: >60
GFR calc non Af Amer: >60
Potassium: 4.3

## 2010-12-16 LAB — APTT: aPTT: 29

## 2010-12-22 LAB — CBC
HCT: 36.7
Hemoglobin: 12.3
MCV: 91.3
RBC: 4.02
WBC: 3.9 — ABNORMAL LOW

## 2010-12-22 LAB — URINALYSIS, ROUTINE W REFLEX MICROSCOPIC
Bilirubin Urine: NEGATIVE
Glucose, UA: NEGATIVE
Ketones, ur: NEGATIVE
Protein, ur: NEGATIVE
pH: 5.5

## 2010-12-22 LAB — URINE MICROSCOPIC-ADD ON

## 2010-12-22 LAB — BASIC METABOLIC PANEL
Chloride: 108
GFR calc non Af Amer: 54 — ABNORMAL LOW
Potassium: 4.4
Sodium: 140

## 2010-12-30 ENCOUNTER — Encounter (HOSPITAL_BASED_OUTPATIENT_CLINIC_OR_DEPARTMENT_OTHER): Payer: Medicare Other | Admitting: Hematology and Oncology

## 2010-12-30 ENCOUNTER — Other Ambulatory Visit: Payer: Self-pay | Admitting: Hematology and Oncology

## 2010-12-30 DIAGNOSIS — C8589 Other specified types of non-Hodgkin lymphoma, extranodal and solid organ sites: Secondary | ICD-10-CM

## 2010-12-30 DIAGNOSIS — Z86718 Personal history of other venous thrombosis and embolism: Secondary | ICD-10-CM

## 2010-12-30 DIAGNOSIS — Z7901 Long term (current) use of anticoagulants: Secondary | ICD-10-CM

## 2010-12-30 LAB — PROTIME-INR

## 2010-12-31 ENCOUNTER — Other Ambulatory Visit: Payer: Self-pay | Admitting: Family Medicine

## 2010-12-31 NOTE — Telephone Encounter (Signed)
Pt is still in [place] will make ov soon.

## 2010-12-31 NOTE — Telephone Encounter (Signed)
Refill for 3 months. 

## 2011-01-01 NOTE — Telephone Encounter (Signed)
Last OV 07-2010 Last refill Alprazolam #60 with 2 refills on 10/05/10

## 2011-01-05 LAB — URINALYSIS, ROUTINE W REFLEX MICROSCOPIC
Glucose, UA: NEGATIVE
Ketones, ur: NEGATIVE
Nitrite: NEGATIVE
Specific Gravity, Urine: 1.018
pH: 5.5

## 2011-01-05 LAB — URINE MICROSCOPIC-ADD ON

## 2011-01-06 ENCOUNTER — Other Ambulatory Visit: Payer: Self-pay | Admitting: *Deleted

## 2011-01-06 NOTE — Telephone Encounter (Signed)
Alprazolam refill request, one tab bid, last filled 10-05-10, #60 with 2 refills

## 2011-01-07 MED ORDER — ALPRAZOLAM 1 MG PO TABS: 1.0000 mg | ORAL_TABLET | Freq: Two times a day (BID) | ORAL | Status: DC | PRN

## 2011-01-07 NOTE — Telephone Encounter (Signed)
Refill for 3 months then pt needs office follow up.

## 2011-01-08 ENCOUNTER — Ambulatory Visit (INDEPENDENT_AMBULATORY_CARE_PROVIDER_SITE_OTHER): Payer: Medicare Other | Admitting: Family Medicine

## 2011-01-08 ENCOUNTER — Encounter: Payer: Self-pay | Admitting: Family Medicine

## 2011-01-08 VITALS — BP 130/84 | Temp 97.9°F | Wt 252.0 lb

## 2011-01-08 DIAGNOSIS — Z23 Encounter for immunization: Secondary | ICD-10-CM

## 2011-01-08 DIAGNOSIS — I82409 Acute embolism and thrombosis of unspecified deep veins of unspecified lower extremity: Secondary | ICD-10-CM

## 2011-01-08 DIAGNOSIS — R635 Abnormal weight gain: Secondary | ICD-10-CM

## 2011-01-08 DIAGNOSIS — F419 Anxiety disorder, unspecified: Secondary | ICD-10-CM

## 2011-01-08 DIAGNOSIS — E785 Hyperlipidemia, unspecified: Secondary | ICD-10-CM

## 2011-01-08 DIAGNOSIS — F411 Generalized anxiety disorder: Secondary | ICD-10-CM

## 2011-01-08 LAB — HEPATIC FUNCTION PANEL
AST: 17 U/L (ref 0–37)
Alkaline Phosphatase: 71 U/L (ref 39–117)
Bilirubin, Direct: 0.1 mg/dL (ref 0.0–0.3)
Total Protein: 7.1 g/dL (ref 6.0–8.3)

## 2011-01-08 LAB — LIPID PANEL
Cholesterol: 149 mg/dL (ref 0–200)
LDL Cholesterol: 74 mg/dL (ref 0–99)
Total CHOL/HDL Ratio: 2

## 2011-01-08 LAB — BASIC METABOLIC PANEL
BUN: 18 mg/dL (ref 6–23)
Calcium: 9.7 mg/dL (ref 8.4–10.5)
Chloride: 104 mEq/L (ref 96–112)
Creatinine, Ser: 1.1 mg/dL (ref 0.4–1.2)

## 2011-01-08 MED ORDER — OXYCODONE-ACETAMINOPHEN 5-325 MG PO TABS: 1.0000 | ORAL_TABLET | ORAL | Status: AC | PRN

## 2011-01-08 MED ORDER — ALPRAZOLAM 1 MG PO TABS: 1.0000 mg | ORAL_TABLET | Freq: Two times a day (BID) | ORAL | Status: DC | PRN

## 2011-01-08 NOTE — Progress Notes (Signed)
Subjective:    Patient ID: [name], female    DOB: 12-29-1945, 65 y.o.   MRN: [id number]  HPI  Medical followup. Several areas addressed as below  Patient needs flu vaccine. Also cannot confirm prior Pneumovax. No contraindications to flu vaccine. She is high risk by virtue of her age, history of probable CAD, and ongoing nicotine use.  Dyslipidemia. Previously intolerant to Crestor. Currently on Livalo and no recent labs. We cannot confirm that she has had lipids or hepatic panel since starting this medication several months ago. She is not having any myalgias at this time. Probable CAD but no history of prior catheterization. Denies recent chest pain.  Patient has history of depression. Currently stable. Remains on Celexa and Wellbutrin. No side effects. Compliant with therapy. History of chronic anxiety and has taken alprazolam 1 mg twice a day for several years. Husband is preparing to move out which she thinks will be a great stress reduction.  Patient has remote history of DVT. Maintained on Coumadin per oncology. Prior history lymphoma. No recent bleeding complications.  Patient has had some recent weight gain. No exercise. Requesting thyroid functions. Also has some occasional fatigue  Rarely takes Percocet for resistant headache. She's taken only 30 tablets over the past year. Requesting one refill.  Past Medical History  Diagnosis Date  . DYSLIPIDEMIA 02/27/2009  . DEPRESSION 06/13/2008  . CAD, NATIVE VESSEL 05/30/2009  . ALLERGIC RHINITIS 08/01/2009  . DYSPNEA 02/27/2009  . ABDOMINAL PAIN RIGHT UPPER QUADRANT 11/08/2008  . CARDIOVASCULAR FUNCTION STUDY, ABNORMAL 05/01/2009  . NON-HODGKIN'S LYMPHOMA, HX OF 11/08/2008  . OSTEOARTHRITIS, MODERATE 04/07/2010   Past Surgical History  Procedure Date  . Abdominal surgery 2005    exploratory  . Breast surgery     tumor excision  . Knee surgery 2005    meniscal  . Renal artery stent     2010 Dr Aldean Ast    reports that  she quit smoking about 2 years ago. Her smoking use included Cigarettes. She has a 104 pack-year smoking history. She does not have any smokeless tobacco history on file. Her alcohol and drug histories not on file. family history includes Arthritis in her mother; Diabetes in her sister; Heart disease in her mother; Hypertension in her sister; and Stroke in her sister. Allergies  Allergen Reactions  . Penicillins       Review of Systems  Constitutional: Positive for fatigue. Negative for fever, chills and unexpected weight change.  HENT: Negative for trouble swallowing.   Respiratory: Negative for choking, shortness of breath and wheezing.   Cardiovascular: Negative for chest pain, palpitations and leg swelling.  Gastrointestinal: Negative for abdominal pain and blood in stool.  Genitourinary: Negative for dysuria.  Neurological: Negative for dizziness, syncope, weakness and headaches.  Hematological: Negative for adenopathy.  Psychiatric/Behavioral: Negative for suicidal ideas and dysphoric mood.       Objective:   Physical Exam  Constitutional: She is oriented to person, place, and time. She appears well-developed and well-nourished.  HENT:  Mouth/Throat: Oropharynx is clear and moist.  Neck: Neck supple. No thyromegaly present.  Cardiovascular: Normal rate and regular rhythm.  Exam reveals no gallop.   Pulmonary/Chest: Effort normal and breath sounds normal. No respiratory distress. She has no wheezes. She has no rales.  Musculoskeletal: She exhibits no edema.  Neurological: She is alert and oriented to person, place, and time.  Psychiatric: She has a normal mood and affect. Her behavior is normal.  Assessment & Plan:  #1 health maintenance. Flu vaccine given. Pneumovax given. Patient quit smoking approximately 2 years ago. No relapse. #2 dyslipidemia. Check lipid and hepatic panel #3 history of chronic and recurrent depression currently stable.  Continue current  medications. Refilled Xanax #4 remote history of DVT. On Coumadin followed by oncology #5 recent weight gain. Check TSH

## 2011-01-13 ENCOUNTER — Other Ambulatory Visit: Payer: Self-pay | Admitting: Family Medicine

## 2011-01-14 NOTE — Progress Notes (Signed)
Quick Note:  Pt informed ______ 

## 2011-01-16 ENCOUNTER — Other Ambulatory Visit: Payer: Self-pay | Admitting: Cardiology

## 2011-01-22 DIAGNOSIS — I82409 Acute embolism and thrombosis of unspecified deep veins of unspecified lower extremity: Secondary | ICD-10-CM | POA: Insufficient documentation

## 2011-01-27 ENCOUNTER — Other Ambulatory Visit: Payer: Medicare Other | Admitting: Lab

## 2011-01-27 ENCOUNTER — Ambulatory Visit (HOSPITAL_BASED_OUTPATIENT_CLINIC_OR_DEPARTMENT_OTHER): Payer: Medicare Other

## 2011-01-27 DIAGNOSIS — Z86718 Personal history of other venous thrombosis and embolism: Secondary | ICD-10-CM

## 2011-01-27 DIAGNOSIS — I82409 Acute embolism and thrombosis of unspecified deep veins of unspecified lower extremity: Secondary | ICD-10-CM

## 2011-01-27 DIAGNOSIS — Z7901 Long term (current) use of anticoagulants: Secondary | ICD-10-CM

## 2011-01-27 LAB — PROTIME-INR: INR: 2.7 (ref 2.00–3.50)

## 2011-02-11 ENCOUNTER — Other Ambulatory Visit: Payer: Self-pay | Admitting: Hematology and Oncology

## 2011-02-11 DIAGNOSIS — I82409 Acute embolism and thrombosis of unspecified deep veins of unspecified lower extremity: Secondary | ICD-10-CM

## 2011-02-24 ENCOUNTER — Ambulatory Visit (HOSPITAL_BASED_OUTPATIENT_CLINIC_OR_DEPARTMENT_OTHER): Payer: Medicare Other | Admitting: Pharmacist

## 2011-02-24 ENCOUNTER — Other Ambulatory Visit (HOSPITAL_BASED_OUTPATIENT_CLINIC_OR_DEPARTMENT_OTHER): Payer: Medicare Other | Admitting: Lab

## 2011-02-24 ENCOUNTER — Other Ambulatory Visit: Payer: Self-pay | Admitting: Hematology and Oncology

## 2011-02-24 DIAGNOSIS — Z86718 Personal history of other venous thrombosis and embolism: Secondary | ICD-10-CM

## 2011-02-24 DIAGNOSIS — Z7901 Long term (current) use of anticoagulants: Secondary | ICD-10-CM

## 2011-02-24 DIAGNOSIS — I82409 Acute embolism and thrombosis of unspecified deep veins of unspecified lower extremity: Secondary | ICD-10-CM

## 2011-02-24 LAB — PROTIME-INR
INR: 2 (ref 2.00–3.50)
Protime: 24 Seconds — ABNORMAL HIGH (ref 10.6–13.4)

## 2011-03-10 ENCOUNTER — Ambulatory Visit: Payer: Medicare Other

## 2011-03-10 ENCOUNTER — Telehealth: Payer: Self-pay | Admitting: *Deleted

## 2011-03-10 ENCOUNTER — Other Ambulatory Visit: Payer: Medicare Other

## 2011-03-10 NOTE — Telephone Encounter (Signed)
per the pharmacy cancelling patient appointments

## 2011-03-23 ENCOUNTER — Encounter: Payer: Self-pay | Admitting: Nurse Practitioner

## 2011-03-27 ENCOUNTER — Other Ambulatory Visit (HOSPITAL_COMMUNITY): Payer: Self-pay

## 2011-03-30 ENCOUNTER — Encounter (HOSPITAL_COMMUNITY): Payer: Self-pay

## 2011-03-30 ENCOUNTER — Encounter (HOSPITAL_COMMUNITY)
Admission: RE | Admit: 2011-03-30 | Discharge: 2011-03-30 | Disposition: A | Discharge status: Home / Self Care | Payer: Medicare Other | Source: Ambulatory Visit | Attending: Hematology and Oncology | Admitting: Hematology and Oncology

## 2011-03-30 DIAGNOSIS — Z9221 Personal history of antineoplastic chemotherapy: Secondary | ICD-10-CM | POA: Insufficient documentation

## 2011-03-30 DIAGNOSIS — C8590 Non-Hodgkin lymphoma, unspecified, unspecified site: Secondary | ICD-10-CM

## 2011-03-30 DIAGNOSIS — C8589 Other specified types of non-Hodgkin lymphoma, extranodal and solid organ sites: Secondary | ICD-10-CM | POA: Insufficient documentation

## 2011-03-30 DIAGNOSIS — Z923 Personal history of irradiation: Secondary | ICD-10-CM | POA: Insufficient documentation

## 2011-03-30 MED ORDER — FLUDEOXYGLUCOSE F - 18 (FDG) INJECTION
16.5000 | Freq: Once | INTRAVENOUS | Status: AC | PRN
  Administered 2011-03-30: 16.5 via INTRAVENOUS

## 2011-04-01 ENCOUNTER — Other Ambulatory Visit (HOSPITAL_BASED_OUTPATIENT_CLINIC_OR_DEPARTMENT_OTHER): Payer: Medicare Other | Admitting: Lab

## 2011-04-01 ENCOUNTER — Telehealth: Payer: Self-pay | Admitting: Hematology and Oncology

## 2011-04-01 ENCOUNTER — Ambulatory Visit: Payer: Medicare Other

## 2011-04-01 ENCOUNTER — Ambulatory Visit (HOSPITAL_BASED_OUTPATIENT_CLINIC_OR_DEPARTMENT_OTHER): Payer: Medicare Other | Admitting: Hematology and Oncology

## 2011-04-01 ENCOUNTER — Ambulatory Visit (HOSPITAL_BASED_OUTPATIENT_CLINIC_OR_DEPARTMENT_OTHER): Payer: Self-pay | Admitting: Hematology and Oncology

## 2011-04-01 ENCOUNTER — Ambulatory Visit (HOSPITAL_BASED_OUTPATIENT_CLINIC_OR_DEPARTMENT_OTHER): Payer: Medicare Other

## 2011-04-01 VITALS — BP 132/83 | HR 80 | Temp 99.1°F | Ht 67.0 in | Wt 250.1 lb

## 2011-04-01 DIAGNOSIS — C8589 Other specified types of non-Hodgkin lymphoma, extranodal and solid organ sites: Secondary | ICD-10-CM

## 2011-04-01 DIAGNOSIS — Z87898 Personal history of other specified conditions: Secondary | ICD-10-CM

## 2011-04-01 DIAGNOSIS — I824Z9 Acute embolism and thrombosis of unspecified deep veins of unspecified distal lower extremity: Secondary | ICD-10-CM

## 2011-04-01 DIAGNOSIS — Z86718 Personal history of other venous thrombosis and embolism: Secondary | ICD-10-CM

## 2011-04-01 DIAGNOSIS — I82409 Acute embolism and thrombosis of unspecified deep veins of unspecified lower extremity: Secondary | ICD-10-CM

## 2011-04-01 DIAGNOSIS — Z923 Personal history of irradiation: Secondary | ICD-10-CM

## 2011-04-01 DIAGNOSIS — Z9221 Personal history of antineoplastic chemotherapy: Secondary | ICD-10-CM

## 2011-04-01 LAB — CBC WITH DIFFERENTIAL/PLATELET
Basophils Absolute: 0 10*3/uL (ref 0.0–0.1)
Eosinophils Absolute: 0.1 10*3/uL (ref 0.0–0.5)
HGB: 12.5 g/dL (ref 11.6–15.9)
MCV: 86.2 fL (ref 79.5–101.0)
MONO#: 0.5 10*3/uL (ref 0.1–0.9)
MONO%: 9.2 % (ref 0.0–14.0)
NEUT#: 2.9 10*3/uL (ref 1.5–6.5)
RBC: 4.3 10*6/uL (ref 3.70–5.45)
RDW: 14.9 % — ABNORMAL HIGH (ref 11.2–14.5)
WBC: 4.9 10*3/uL (ref 3.9–10.3)
lymph#: 1.4 10*3/uL (ref 0.9–3.3)

## 2011-04-01 LAB — COMPREHENSIVE METABOLIC PANEL
Albumin: 4 g/dL (ref 3.5–5.2)
BUN: 18 mg/dL (ref 6–23)
CO2: 26 mEq/L (ref 19–32)
Calcium: 10.1 mg/dL (ref 8.4–10.5)
Chloride: 103 mEq/L (ref 96–112)
Glucose, Bld: 116 mg/dL — ABNORMAL HIGH (ref 70–99)
Potassium: 4.1 mEq/L (ref 3.5–5.3)
Sodium: 138 mEq/L (ref 135–145)
Total Protein: 7.5 g/dL (ref 6.0–8.3)

## 2011-04-01 LAB — PROTIME-INR

## 2011-04-01 NOTE — Progress Notes (Signed)
This office note has been dictated.

## 2011-04-01 NOTE — Progress Notes (Signed)
INR likely low due to missed dose on Monday although pt did take 7.5 mg on Tues (a usual 5 mg day). No swelling in legs/pain or chest pain per pt. Will have pt take additional 7.5 mg tomorrow then resume usual dose. Return in 2 weeks. Marily Lente, Pharm.D.

## 2011-04-01 NOTE — Telephone Encounter (Signed)
gv pt appt [facility] for jan 2014 including pet scan for 03/28/2012 @ 1:15 pm.

## 2011-04-02 NOTE — Progress Notes (Signed)
CC:   Evelena Peat, M.D. Marca Ancona, MD  IDENTIFYING STATEMENT:  The patient is a 66 year old woman with Hodgkin lymphoma and history of lower extremity deep vein thrombosis who presents for followup.  INTERVAL HISTORY:  Mrs. [name] was last seen a year-and-a-half ago. She has had no significant complaints from the oncology standpoint since her last followup visit.  She specifically denies fever, chills, night sweats, or weight loss.  She denies pain.  Her weight is stable.  [facility], which monitors her Coumadin levels.  MEDICATIONS:  Reviewed and updated.  ALLERGIES:  Penicillin.  PAST MEDICAL HISTORY/FAMILY HISTORY/SOCIAL HISTORY:  Unchanged.  REVIEW OF SYSTEMS:  A ten-point review of systems is negative.  PHYSICAL EXAM:  General: The patient is a well-appearing, well-nourished woman in no distress.  Vitals: Pulse 80, blood pressure 132/83, temperature 99.1, respirations 20, and weight 250 pounds.  HEENT: Head is atraumatic, normocephalic.  Sclerae anicteric.  Mouth is moist.  Neck is supple.  Chest is clear to percussion auscultation.  CVS: 1st and 2nd heart sounds present.  No added sounds or murmurs.  Abdomen: No masses. No hepatomegaly.  Bowel sounds present.  Extremities: No calf tenderness.  Pulses present and symmetrical.  CNS: Nonfocal. Lymph nodes: No adenopathy.  LAB DATA:  Pending.  IMAGING DATA:  Results of recent PET scan on 03/30/2011 revealed no evidence of hypermetabolic recurrent or residual disease.  The bilateral cervical nodes that were seen had mild hypermetabolism which is similar to the patient's prior exam, with no enlarging of cervical nodes.  The fascial thickening along the left colon and external iliac vasculature remained unchanged and felt to be likely related to the lymphoma.  Left- sided ureteric stent was noted and in place.  IMPRESSION AND PLAN:  Mrs. Finnie is a 66 year old woman  with: 1. 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  She is status post 8 cycles of Rituxan with     cyclophosphamide, hydroxydaunorubicin, Oncovin, prednisone     chemotherapy completing treatment on September 04, 2004.  She then went     on to receive consolidated radiation to retroperitoneum and pelvis.     Her current exam and blood work and PET indicate no evidence of     recurrence.  She wants to continue close follow up with the [facility], so she will be seen in a year's time with repeat staging     studies and blood work. 2. History of left deep vein thrombosis, felt at the time to be due to     retroperitoneal fibrosis.  She continues on chronic Coumadin and     prefers to remain on Coumadin.  This will continuously be monitored     through the Anticoagulation clinic here at the [facility].  Mrs. Dowson follows up in a year's time.  I spent more than half the time coordinating care.    ______________________________ Laurice Record, M.D. LIO/MEDQ  D:  04/01/2011  T:  04/01/2011  Job:  [id number]

## 2011-04-03 ENCOUNTER — Telehealth: Payer: Self-pay | Admitting: *Deleted

## 2011-04-03 NOTE — Telephone Encounter (Signed)
Called pt on cell phone and left message on voice mail re:  Lab results look good as per md.

## 2011-04-14 ENCOUNTER — Other Ambulatory Visit: Payer: Self-pay | Admitting: Urology

## 2011-04-15 ENCOUNTER — Other Ambulatory Visit (HOSPITAL_BASED_OUTPATIENT_CLINIC_OR_DEPARTMENT_OTHER): Payer: Medicare Other | Admitting: Lab

## 2011-04-15 ENCOUNTER — Ambulatory Visit: Payer: Medicare Other

## 2011-04-15 DIAGNOSIS — I82409 Acute embolism and thrombosis of unspecified deep veins of unspecified lower extremity: Secondary | ICD-10-CM

## 2011-04-15 LAB — PROTIME-INR
INR: 1.8 — ABNORMAL LOW (ref 2.00–3.50)
Protime: 21.6 Seconds — ABNORMAL HIGH (ref 10.6–13.4)

## 2011-04-15 NOTE — Progress Notes (Unsigned)
Take 7.5mg tomorrow, then resume current Coumadin dose.  Plan on holding Coumadin 04/25/11 for 3 days prior to kidney stent replacement.  After procedure resume current Coumadin dose of 5mg everyday except 7.5mg Monday, Wednesday and Friday.  Will check PT/INR on 05/05/11.  

## 2011-04-15 NOTE — Patient Instructions (Signed)
Take 7.5mg  tomorrow, then resume current Coumadin dose.  Plan on holding Coumadin 04/25/11 for 3 days prior to kidney stent replacement.  After procedure resume current Coumadin dose of 5mg  everyday except 7.5mg  Monday, Wednesday and Friday.  Will check PT/INR on 05/05/11.

## 2011-04-19 MED ORDER — CIPROFLOXACIN IN D5W 400 MG/200ML IV SOLN: 400.0000 mg | Freq: Once | INTRAVENOUS | Status: DC

## 2011-04-22 ENCOUNTER — Encounter (HOSPITAL_BASED_OUTPATIENT_CLINIC_OR_DEPARTMENT_OTHER): Payer: Self-pay | Admitting: *Deleted

## 2011-04-22 NOTE — Progress Notes (Signed)
NPO AFTER MN. ARRIVES AT 0945. NEEDS PT/INR, BMET, HH , AND EKG. WILL TAKES PRILOSEC AND XANAX AM OF SURG. W/ SIP OF WATER.

## 2011-04-27 ENCOUNTER — Other Ambulatory Visit: Payer: Self-pay | Admitting: Cardiology

## 2011-04-27 NOTE — H&P (Signed)
Urology Admission H&P  Chief Complaint: Left ureteral stricture/RPF and hydronephrosis  History of Present Illness:  66 yo with h/o Hodgkin's Lymphoma with chronic left stent since 2005. Completed chemo and RP XRT in 2006. Last PET CT July 2011 showed no right hydro, left stent in place without stones and no evidence of metabolic activity/disease. Left stent last changed Aug 2012. CT A/P in 2010 normal. Stent was in place. I didnt appreciate any RPF.   She underwent recent PET CT. I reviewed pt's CT and PET scans with Dr. Lavon Paganini. [facility]) - we looked at the images. Her ureter now is almost completely free from RPF. Although the ureter is slightly medial it mostly has a normal fat plane around it. There is not much fat on the posterior portion especially at the iliacs which may indicate obstruction.     She's well today with no complaints. No dysuria or stent pain.  Past Medical History  Diagnosis Date  . DYSLIPIDEMIA 02/27/2009  . DEPRESSION 06/13/2008  . ALLERGIC RHINITIS 08/01/2009  . ABDOMINAL PAIN RIGHT UPPER QUADRANT 11/08/2008  . CARDIOVASCULAR FUNCTION STUDY, ABNORMAL 04/04/2009  . NON-HODGKIN'S LYMPHOMA, HX OF dx  2005--  chemoradiation completed 2006--  no recurrence    onocologist- dr odogwu--   . OSTEOARTHRITIS, MODERATE 04/07/2010  . CAD, NATIVE VESSEL 05/30/2009    cardiologist- dr Shirlee Latch- visit 04-13-2010 in epic-- denies cardiac symptoms  . DYSPNEA 02/27/2009  . Retroperitoneal fibrosis   . Frequency of urination   . History of Lyme disease   . History of Bell's palsy   . DDD (degenerative disc disease), lumbar   . GERD (gastroesophageal reflux disease)   . Anxiety   . 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 History of DVT of lower extremity 2006-- CHRONIC COUMADIN THERAPY  . Anticoagulated on Coumadin    Past Surgical History  Procedure Date  . Multiple cysto/ left ureteral stent exchanges LAST ONE 10-28-10  . Total knee  arthroplasty OCT 2010    RIGHT  . Total knee arthroplasty JAN 2010    LEFT  . Knee arthroscopy 2005    RIGHT  . Breast mass excision     BENIGN-- RIGHT BREAST  . Retroperitoneal bx 2007  . Exploratory laparotomy 2005    LYMPHOMA  . Tonsillectomy CHILD  . Cardiovascular stress test 04-04-2009-- PER PT ASYMPTOMATIC-- MEDICAL MANAGEMENT    STUDY WAS EQUIVOCAL/ EF 49%/ GLOBAL HYPOKINESIS/ APPEARS TO BE A MILD ANTERIOR PERFUSION DEFECT COULD REPRESENT ISCHEMIA BUT DUE TO  ACTIVITY WORSE IN STRESS THAN AT REST POSS. THE DEFECT WAS ARTIFACTUAL  . Transthoracic echocardiogram 05-03-2009    MILD SYSTOLIC DYSFUNCTION/ EF 45-50%/ MODERATE DIASTOLIC DYSFUCTION/ MILD MR/ MILD BIATRIAL ENLARGEMENT  . Ct angiogram FEB 2011    CALCIUM SCORE 161/ POSS. MODERATE MID LAD STENOSIS    Home Medications:  No prescriptions prior to admission   Allergies:  Allergies  Allergen Reactions  . Chlorhexidine Base Itching    CHG WIPES  . Penicillins Hives    Family History  Problem Relation Age of Onset  . Arthritis Mother   . Heart disease Mother   . Diabetes Sister   . Hypertension Sister   . Stroke Sister    Social History:  reports that she quit smoking about 3 years ago. Her smoking use included Cigarettes. She has a 104 pack-year smoking history. She has never used smokeless tobacco. She reports that she drinks alcohol. She reports that she does not use illicit  drugs.  Review of Systems  All other systems reviewed and are negative.    Physical Exam:  Vital signs in last 24 hours: AFVSS today   Physical Exam  Constitutional: She is oriented to person, place, and time. She appears well-developed and well-nourished.  HENT:  Head: Normocephalic.  Cardiovascular: Normal rate.   Respiratory: Effort normal. No respiratory distress.  GI: There is no tenderness.  Musculoskeletal: Normal range of motion.  Neurological: She is alert and oriented to person, place, and time.  Skin: Skin is warm  and dry.  Psychiatric: She has a normal mood and affect.    Laboratory Data:  No results found for this or any previous visit (from the past 24 hour(s)). No results found for this or any previous visit (from the past 240 hour(s)). Creatinine: No results found for this basename: CREATININE:7 in the last 168 hours  ISTAT normal  Impression/Assessment:  Left ureteral stricture/RPF  Plan:  We discussed continued stent changes vs. trial without stent. We discussed risks of leaving the stent out such as recurrent obstruction with pain, infection/severe infection and need for stent replacement. All questions answered. She would like to try and go without the stent. Therefore, we discussed cystoscopy with left RGP in OR. If ureter looks patent and drains we will leave stent out. If there is obvious stricture or narrowing and no drainage we will replace stent. She agrees with plan. She said "she hopes she doesn't get that terrible pain" in her left side if we leave the stent out, but she is hopeful she wont need it anymore.

## 2011-04-28 ENCOUNTER — Ambulatory Visit (HOSPITAL_BASED_OUTPATIENT_CLINIC_OR_DEPARTMENT_OTHER)
Admission: RE | Admit: 2011-04-28 | Discharge: 2011-04-28 | Disposition: A | Discharge status: Home / Self Care | Payer: Medicare Other | Source: Ambulatory Visit | Attending: Urology | Admitting: Urology

## 2011-04-28 ENCOUNTER — Ambulatory Visit (HOSPITAL_BASED_OUTPATIENT_CLINIC_OR_DEPARTMENT_OTHER): Payer: Medicare Other | Admitting: Anesthesiology

## 2011-04-28 ENCOUNTER — Encounter (HOSPITAL_BASED_OUTPATIENT_CLINIC_OR_DEPARTMENT_OTHER): Payer: Self-pay | Admitting: Anesthesiology

## 2011-04-28 ENCOUNTER — Encounter (HOSPITAL_BASED_OUTPATIENT_CLINIC_OR_DEPARTMENT_OTHER)
Admission: RE | Disposition: A | Discharge status: Home / Self Care | Payer: Self-pay | Source: Ambulatory Visit | Attending: Urology

## 2011-04-28 ENCOUNTER — Encounter (HOSPITAL_BASED_OUTPATIENT_CLINIC_OR_DEPARTMENT_OTHER): Payer: Self-pay | Admitting: *Deleted

## 2011-04-28 DIAGNOSIS — M1990 Unspecified osteoarthritis, unspecified site: Secondary | ICD-10-CM | POA: Insufficient documentation

## 2011-04-28 DIAGNOSIS — Z86718 Personal history of other venous thrombosis and embolism: Secondary | ICD-10-CM | POA: Insufficient documentation

## 2011-04-28 DIAGNOSIS — Z7901 Long term (current) use of anticoagulants: Secondary | ICD-10-CM | POA: Insufficient documentation

## 2011-04-28 DIAGNOSIS — Z87898 Personal history of other specified conditions: Secondary | ICD-10-CM | POA: Insufficient documentation

## 2011-04-28 DIAGNOSIS — Z466 Encounter for fitting and adjustment of urinary device: Secondary | ICD-10-CM | POA: Insufficient documentation

## 2011-04-28 DIAGNOSIS — Z01812 Encounter for preprocedural laboratory examination: Secondary | ICD-10-CM | POA: Insufficient documentation

## 2011-04-28 DIAGNOSIS — I2510 Atherosclerotic heart disease of native coronary artery without angina pectoris: Secondary | ICD-10-CM | POA: Insufficient documentation

## 2011-04-28 DIAGNOSIS — Z923 Personal history of irradiation: Secondary | ICD-10-CM | POA: Insufficient documentation

## 2011-04-28 DIAGNOSIS — Z9221 Personal history of antineoplastic chemotherapy: Secondary | ICD-10-CM | POA: Insufficient documentation

## 2011-04-28 DIAGNOSIS — K219 Gastro-esophageal reflux disease without esophagitis: Secondary | ICD-10-CM | POA: Insufficient documentation

## 2011-04-28 DIAGNOSIS — E785 Hyperlipidemia, unspecified: Secondary | ICD-10-CM | POA: Insufficient documentation

## 2011-04-28 HISTORY — DX: Crossing vessel and stricture of ureter without hydronephrosis: N13.5

## 2011-04-28 HISTORY — DX: Other intervertebral disc degeneration, lumbar region without mention of lumbar back pain or lower extremity pain: M51.369

## 2011-04-28 HISTORY — DX: Personal history of other diseases of the nervous system and sense organs: Z86.69

## 2011-04-28 HISTORY — DX: Retroperitoneal fibrosis: K68.2

## 2011-04-28 HISTORY — DX: Personal history of other venous thrombosis and embolism: Z86.718

## 2011-04-28 HISTORY — DX: Frequency of micturition: R35.0

## 2011-04-28 HISTORY — DX: Unspecified hydronephrosis: N13.30

## 2011-04-28 HISTORY — PX: CYSTOSCOPY W/ URETERAL STENT REMOVAL: SHX1430

## 2011-04-28 HISTORY — DX: Personal history of other infectious and parasitic diseases: Z86.19

## 2011-04-28 HISTORY — DX: Gastro-esophageal reflux disease without esophagitis: K21.9

## 2011-04-28 HISTORY — PX: CYSTOSCOPY W/ RETROGRADES: SHX1426

## 2011-04-28 HISTORY — DX: Other intervertebral disc degeneration, lumbar region: M51.36

## 2011-04-28 HISTORY — DX: Anxiety disorder, unspecified: F41.9

## 2011-04-28 LAB — POCT I-STAT 4, (NA,K, GLUC, HGB,HCT)
Hemoglobin: 11.9 g/dL — ABNORMAL LOW (ref 12.0–15.0)
Potassium: 4.4 mEq/L (ref 3.5–5.1)
Sodium: 142 mEq/L (ref 135–145)

## 2011-04-28 SURGERY — REMOVAL, STENT, URETER, CYSTOSCOPIC
Anesthesia: General | Site: Ureter | Laterality: Left | Wound class: Clean Contaminated

## 2011-04-28 MED ORDER — SODIUM CHLORIDE 0.9 % IR SOLN
Status: DC | PRN
  Administered 2011-04-28: 3000 mL

## 2011-04-28 MED ORDER — FENTANYL CITRATE 0.05 MG/ML IJ SOLN: 25.0000 ug | INTRAMUSCULAR | Status: DC | PRN

## 2011-04-28 MED ORDER — OXYCODONE HCL 5 MG PO TABS: 5.0000 mg | ORAL_TABLET | Freq: Four times a day (QID) | ORAL | Status: AC | PRN

## 2011-04-28 MED ORDER — CLINDAMYCIN HCL 300 MG PO CAPS: 300.0000 mg | ORAL_CAPSULE | Freq: Three times a day (TID) | ORAL | Status: AC

## 2011-04-28 MED ORDER — PROMETHAZINE HCL 25 MG/ML IJ SOLN: 6.2500 mg | INTRAMUSCULAR | Status: DC | PRN

## 2011-04-28 MED ORDER — CIPROFLOXACIN IN D5W 400 MG/200ML IV SOLN
400.0000 mg | Freq: Once | INTRAVENOUS | Status: AC
  Administered 2011-04-28: 400 mg via INTRAVENOUS

## 2011-04-28 MED ORDER — MIDAZOLAM HCL 5 MG/5ML IJ SOLN
INTRAMUSCULAR | Status: DC | PRN
  Administered 2011-04-28: 1 mg via INTRAVENOUS

## 2011-04-28 MED ORDER — LACTATED RINGERS IV SOLN
INTRAVENOUS | Status: DC
  Administered 2011-04-28: 10:00:00 via INTRAVENOUS

## 2011-04-28 MED ORDER — PROPOFOL 10 MG/ML IV EMUL
INTRAVENOUS | Status: DC | PRN
  Administered 2011-04-28: 170 mg via INTRAVENOUS

## 2011-04-28 MED ORDER — IOHEXOL 350 MG/ML SOLN
INTRAVENOUS | Status: DC | PRN
  Administered 2011-04-28: 50 mL via INTRAVENOUS

## 2011-04-28 MED ORDER — FUROSEMIDE 10 MG/ML IJ SOLN
INTRAMUSCULAR | Status: DC | PRN
  Administered 2011-04-28: 5 mg via INTRAMUSCULAR

## 2011-04-28 MED ORDER — LACTATED RINGERS IV SOLN: INTRAVENOUS | Status: DC

## 2011-04-28 MED ORDER — FENTANYL CITRATE 0.05 MG/ML IJ SOLN
INTRAMUSCULAR | Status: DC | PRN
  Administered 2011-04-28 (×2): 25 ug via INTRAVENOUS
  Administered 2011-04-28: 50 ug via INTRAVENOUS

## 2011-04-28 MED ORDER — LIDOCAINE HCL (CARDIAC) 20 MG/ML IV SOLN
INTRAVENOUS | Status: DC | PRN
  Administered 2011-04-28: 60 mg via INTRAVENOUS

## 2011-04-28 SURGICAL SUPPLY — 28 items
ADAPTER CATH URET PLST 4-6FR (CATHETERS) ×2 IMPLANT
ADPR CATH URET STRL DISP 4-6FR (CATHETERS) ×3
APL SKNCLS STERI-STRIP NONHPOA (GAUZE/BANDAGES/DRESSINGS)
BAG DRAIN URO-CYSTO SKYTR STRL (DRAIN) ×4 IMPLANT
BAG DRN UROCATH (DRAIN) ×3
BENZOIN TINCTURE PRP APPL 2/3 (GAUZE/BANDAGES/DRESSINGS) IMPLANT
CANISTER SUCT LVC 12 LTR MEDI- (MISCELLANEOUS) ×3 IMPLANT
CATH INTERMIT  6FR 70CM (CATHETERS) IMPLANT
CATH URET 5FR 28IN CONE TIP (BALLOONS)
CATH URET 5FR 28IN OPEN ENDED (CATHETERS) IMPLANT
CATH URET 5FR 70CM CONE TIP (BALLOONS) IMPLANT
CLOTH BEACON ORANGE TIMEOUT ST (SAFETY) ×4 IMPLANT
DRAPE CAMERA CLOSED 9X96 (DRAPES) ×4 IMPLANT
GLOVE BIO SURGEON STRL SZ7.5 (GLOVE) ×4 IMPLANT
GLOVE ECLIPSE 6.0 STRL STRAW (GLOVE) ×2 IMPLANT
GOWN STRL NON-REIN LRG LVL3 (GOWN DISPOSABLE) ×3 IMPLANT
GOWN STRL REIN XL XLG (GOWN DISPOSABLE) ×4 IMPLANT
GUIDEWIRE 0.038 PTFE COATED (WIRE) IMPLANT
GUIDEWIRE ANG ZIPWIRE 038X150 (WIRE) IMPLANT
GUIDEWIRE STR DUAL SENSOR (WIRE) ×3 IMPLANT
KIT BALLIN UROMAX 15FX10 (LABEL) IMPLANT
KIT BALLN UROMAX 15FX4 (MISCELLANEOUS) IMPLANT
KIT BALLN UROMAX 26 75X4 (MISCELLANEOUS)
NS IRRIG 500ML POUR BTL (IV SOLUTION) IMPLANT
PACK CYSTOSCOPY (CUSTOM PROCEDURE TRAY) ×4 IMPLANT
SET HIGH PRES BAL DIL (LABEL)
SHEATH URET ACCESS 12FR/35CM (UROLOGICAL SUPPLIES) IMPLANT
SHEATH URET ACCESS 12FR/55CM (UROLOGICAL SUPPLIES) IMPLANT

## 2011-04-28 NOTE — Anesthesia Postprocedure Evaluation (Signed)
Anesthesia Post Note  Patient: [name]  Procedure(s) Performed:  CYSTOSCOPY WITH STENT REMOVAL; CYSTOSCOPY WITH RETROGRADE PYELOGRAM  Anesthesia type: General  Patient location: PACU  Post pain: Pain level controlled  Post assessment: Post-op Vital signs reviewed  Last Vitals:  Filed Vitals:   04/28/11 1115  BP: 135/97  Pulse:   Temp:   Resp:     Post vital signs: Reviewed  Level of consciousness: sedated  Complications: No apparent anesthesia complications

## 2011-04-28 NOTE — Anesthesia Preprocedure Evaluation (Addendum)
Anesthesia Evaluation  Patient identified by MRN, date of birth, ID band Patient awake    Reviewed: Allergy & Precautions, H&P , NPO status , Patient's Chart, lab work & pertinent test results  History of Anesthesia Complications Negative for: history of anesthetic complications  Airway Mallampati: II      Dental  (+)    Pulmonary shortness of breath and with exertion, former smoker clear to auscultation        Cardiovascular + CAD Regular Normal History of DVT lower extremity; Rx Coumadin 2D ECHO 2011 with EF 45-50%   Neuro/Psych PSYCHIATRIC DISORDERS Anxiety Depression Negative Neurological ROS     GI/Hepatic Neg liver ROS, GERD-  Medicated,  Endo/Other  Morbid obesity  Renal/GU    Retroperitoneal fibrosis with obstruction ureter    Musculoskeletal negative musculoskeletal ROS (+)   Abdominal (+) obese,   Peds  Hematology History of NonHodgkins Lymphoma; S/P chemo-radiation in 2006   Anesthesia Other Findings   Reproductive/Obstetrics negative OB ROS                        Anesthesia Physical Anesthesia Plan  ASA: III  Anesthesia Plan: General   Post-op Pain Management:    Induction: Intravenous  Airway Management Planned: LMA  Additional Equipment:   Intra-op Plan:   Post-operative Plan:   Informed Consent: I have reviewed the patients History and Physical, chart, labs and discussed the procedure including the risks, benefits and alternatives for the proposed anesthesia with the patient or authorized representative who has indicated his/her understanding and acceptance.   Dental advisory given  Plan Discussed with: CRNA  Anesthesia Plan Comments:         Anesthesia Quick Evaluation

## 2011-04-28 NOTE — Progress Notes (Signed)
Script for oxycodone plain given to pt.

## 2011-04-28 NOTE — Op Note (Signed)
[name], PICCO NO.:  [id number]  MEDICAL RECORD NO.:  [id number]  LOCATION:                               FACILITY:  [facility]  PHYSICIAN:  Jerilee Field, MD   DATE OF BIRTH:  [birth year]  DATE OF PROCEDURE: DATE OF DISCHARGE:                              OPERATIVE REPORT   PREOPERATIVE DIAGNOSES:  Left retroperitoneal fibrosis and left hydronephrosis.  POSTOPERATIVE DIAGNOSES:  Left retroperitoneal fibrosis and left hydronephrosis.  PROCEDURE:  Cystoscopy, left stent removal, and left retrograde pyelogram.  SURGEON:  Jerilee Field, M.D.  ANESTHESIA:  General.  INDICATION FOR PROCEDURE:  [name] is a 65 year old female.  She was treated for lymphoma in 2005 with chemo and radiation that she completed in 2006.  Initially, she had obstruction of the left ureter from her disease, but her recent PET/CTs have shown no evidence of disease.  I reviewed her last one and although her left ureter was medial, there was no obvious anterior fibrosis or mass obstructing the ureter.  Therefore, I discussed with the patient the nature of risks, benefits, and alternatives of cystoscopy, left retrograde, and possible removal of the left stent and not to replace it, all questions were answered.  She understood the risk of infection, kidney damage, and pain among others.  Interpretation of left retrograde pyelogram.  The left ureter showed a normal caliber without dilation or filling defect or stricture in its entire course.  Although it was slightly medial, it appeared normal. The kidney showed a normal renal pelvis and calices without dilation. The ureter was carefully observed and noted to have good peristalsis and washout x3.  DESCRIPTION OF PROCEDURE:  After consent was obtained, the patient was brought to the operating room, a time-out was performed for the patient and procedure.  Preop antibiotics and SCDs were in place.  The patient was then placed in  lithotomy position.  She was prepped and draped in the usual fashion.  A rigid cystoscope was passed per urethra and the left stent was grasped and removed without difficulty.  The patient was given 5 mg of IV Lasix.  The left ureter was cannulated with an 8-French stent and retrograde injection of contrast was obtained with findings previously dictated.  This was repeated 2 more times with good washout of the ureter each time.  The bladder appeared normal.  Her bladder was drained and the scope was removed.  She was then awakened and taken to the recovery room in stable condition.  BLOOD LOSS:  Zero.  DRAINS:  None.  COMPLICATIONS:  None.  SPECIMENS:  None.  DISPOSITION:  The patient is stable to PACU.          ______________________________ Jerilee Field, MD     ME/MEDQ  D:  04/28/2011  T:  04/28/2011  Job:  [id number]

## 2011-04-28 NOTE — Anesthesia Procedure Notes (Signed)
Procedure Name: LMA Insertion Date/Time: 04/28/2011 10:30 AM Performed by: Brightyn Mozer D Pre-anesthesia Checklist: Patient identified, Emergency Drugs available, Suction available and Patient being monitored Patient Re-evaluated:Patient Re-evaluated prior to inductionOxygen Delivery Method: Circle System Utilized Preoxygenation: Pre-oxygenation with 100% oxygen Intubation Type: IV induction Ventilation: Mask ventilation without difficulty LMA: LMA inserted LMA Size: 4.0 Number of attempts: 1 Airway Equipment and Method: bite block Placement Confirmation: positive ETCO2 Tube secured with: Tape Dental Injury: Teeth and Oropharynx as per pre-operative assessment      

## 2011-04-28 NOTE — Brief Op Note (Signed)
04/28/2011  10:53 AM  PATIENT:  [name]  66 y.o. female  PRE-OPERATIVE DIAGNOSIS:  LEFT HYDRONEPHROSIS  POST-OPERATIVE DIAGNOSIS:  LEFT HYDRONEPHROSIS  PROCEDURE:  Procedure(s): CYSTOSCOPY WITH STENT REMOVAL CYSTOSCOPY WITH RETROGRADE PYELOGRAM  SURGEON:  Surgeon(s): Antony Haste, MD   ANESTHESIA:  GEN  EBL:  Total I/O In: 100 [I.V.:100] Out: -   FINDINGS - left ureter medial but patent with good drainage and peristalsis, washed out x 3.   BLOOD ADMINISTERED:none  DRAINS: none   LOCAL MEDICATIONS USED:  NONE  SPECIMEN:  No Specimen  DISPOSITION OF SPECIMEN:  N/A  COUNTS:  YES  Dictation [id number]  PLAN OF CARE: Discharge to home after PACU  PATIENT DISPOSITION:  PACU - hemodynamically stable.   Delay start of Pharmacological VTE agent (>24hrs) due to surgical blood loss or risk of bleeding:  {YES/NO/NOT APPLICABLE:20182

## 2011-04-28 NOTE — Transfer of Care (Signed)
  Immediate Anesthesia Transfer of Care Note  Patient: [name]  Procedure(s) Performed:  CYSTOSCOPY WITH STENT REMOVAL; CYSTOSCOPY WITH RETROGRADE PYELOGRAM  Patient Location: PACU  Anesthesia Type: General  Level of Consciousness: awake, oriented, sedated and patient cooperative  Airway & Oxygen Therapy: Patient Spontanous Breathing and Patient connected to face mask oxygen  Post-op Assessment: Report given to PACU RN and Post -op Vital signs reviewed and stable  Post vital signs: Reviewed and stable  Complications: No apparent anesthesia complications

## 2011-04-28 NOTE — Progress Notes (Signed)
Waiting for  Dr. Mena Goes. Pt wants to talk w/ him

## 2011-04-29 ENCOUNTER — Encounter (HOSPITAL_BASED_OUTPATIENT_CLINIC_OR_DEPARTMENT_OTHER): Payer: Self-pay | Admitting: Urology

## 2011-05-04 ENCOUNTER — Other Ambulatory Visit: Payer: Self-pay | Admitting: Pharmacist

## 2011-05-04 DIAGNOSIS — I82409 Acute embolism and thrombosis of unspecified deep veins of unspecified lower extremity: Secondary | ICD-10-CM

## 2011-05-05 ENCOUNTER — Ambulatory Visit (HOSPITAL_BASED_OUTPATIENT_CLINIC_OR_DEPARTMENT_OTHER): Payer: Medicare Other | Admitting: Pharmacist

## 2011-05-05 ENCOUNTER — Other Ambulatory Visit: Payer: Medicare Other | Admitting: Lab

## 2011-05-05 DIAGNOSIS — I82409 Acute embolism and thrombosis of unspecified deep veins of unspecified lower extremity: Secondary | ICD-10-CM

## 2011-05-05 DIAGNOSIS — I824Z9 Acute embolism and thrombosis of unspecified deep veins of unspecified distal lower extremity: Secondary | ICD-10-CM

## 2011-05-05 LAB — PROTIME-INR

## 2011-05-05 NOTE — Progress Notes (Signed)
Pt had renal stent removed on 04/27/11 so she held Coumadin for 3 days prior. She is happy so far w/ the results; less trips to the bathroom.  She has f/u w/ urologist on 05/12/11. INR a little low today.  She states it takes her several weeks to get back within range after being off Coumadin historically. I will boost w/ 7.5 mg x 1 today then back to 5 mg/day; 7.5 mg MWF. Return on 05/12/11 prior to urology appt. Marily Lente, Pharm.D.

## 2011-05-11 ENCOUNTER — Other Ambulatory Visit: Payer: Self-pay | Admitting: Family Medicine

## 2011-05-11 ENCOUNTER — Other Ambulatory Visit: Payer: Self-pay | Admitting: Pharmacist

## 2011-05-12 ENCOUNTER — Ambulatory Visit (HOSPITAL_BASED_OUTPATIENT_CLINIC_OR_DEPARTMENT_OTHER): Payer: Medicare Other | Admitting: Pharmacist

## 2011-05-12 ENCOUNTER — Other Ambulatory Visit: Payer: Medicare Other | Admitting: Lab

## 2011-05-12 DIAGNOSIS — I82409 Acute embolism and thrombosis of unspecified deep veins of unspecified lower extremity: Secondary | ICD-10-CM

## 2011-05-12 LAB — PROTIME-INR

## 2011-05-12 NOTE — Progress Notes (Signed)
INR still subtherapeutic although closer to goal than recently has been. I will increase dose to 7.5 mg Mon - Fri. & 5 mg on the weekends. Return in 10 days. Pt has ultrasound today w/ urology as f/u for stent removal. Pt mentioned that she is planning a trip to [place] for the month of April.  In the past, she has gotten a RX for PT/INR & she takes it to a local hospital in [place] & they fax results to MD.  I told her we can arrange this when she is more certain of the timing of her travels. Marily Lente, Pharm.D.

## 2011-05-22 ENCOUNTER — Ambulatory Visit (HOSPITAL_BASED_OUTPATIENT_CLINIC_OR_DEPARTMENT_OTHER): Payer: Medicare Other | Admitting: Pharmacist

## 2011-05-22 ENCOUNTER — Other Ambulatory Visit: Payer: Medicare Other | Admitting: Lab

## 2011-05-22 DIAGNOSIS — I824Z9 Acute embolism and thrombosis of unspecified deep veins of unspecified distal lower extremity: Secondary | ICD-10-CM

## 2011-05-22 DIAGNOSIS — I82409 Acute embolism and thrombosis of unspecified deep veins of unspecified lower extremity: Secondary | ICD-10-CM

## 2011-05-22 DIAGNOSIS — Z5181 Encounter for therapeutic drug level monitoring: Secondary | ICD-10-CM

## 2011-05-22 DIAGNOSIS — Z7901 Long term (current) use of anticoagulants: Secondary | ICD-10-CM

## 2011-05-22 DIAGNOSIS — Z87898 Personal history of other specified conditions: Secondary | ICD-10-CM

## 2011-05-22 LAB — LACTATE DEHYDROGENASE: LDH: 162 U/L (ref 94–250)

## 2011-05-22 LAB — CBC WITH DIFFERENTIAL/PLATELET
Basophils Absolute: 0 10*3/uL (ref 0.0–0.1)
Eosinophils Absolute: 0.1 10*3/uL (ref 0.0–0.5)
HGB: 11.5 g/dL — ABNORMAL LOW (ref 11.6–15.9)
MCV: 85.9 fL (ref 79.5–101.0)
MONO#: 0.5 10*3/uL (ref 0.1–0.9)
MONO%: 9.9 % (ref 0.0–14.0)
NEUT#: 2.4 10*3/uL (ref 1.5–6.5)
RDW: 14.9 % — ABNORMAL HIGH (ref 11.2–14.5)
lymph#: 1.6 10*3/uL (ref 0.9–3.3)

## 2011-05-22 LAB — COMPREHENSIVE METABOLIC PANEL
Albumin: 4.1 g/dL (ref 3.5–5.2)
BUN: 17 mg/dL (ref 6–23)
CO2: 23 mEq/L (ref 19–32)
Calcium: 9.1 mg/dL (ref 8.4–10.5)
Chloride: 105 mEq/L (ref 96–112)
Glucose, Bld: 100 mg/dL — ABNORMAL HIGH (ref 70–99)
Potassium: 4.4 mEq/L (ref 3.5–5.3)

## 2011-05-22 LAB — PROTIME-INR
INR: 2.6 (ref 2.00–3.50)
Protime: 31.2 Seconds — ABNORMAL HIGH (ref 10.6–13.4)

## 2011-05-22 NOTE — Progress Notes (Signed)
INR now within range on 7.5 mg during week; 5 mg on weekend (Sat/Sun). Pt states she has eaten a lot of cranberry in her diet over the past week.  May increase INR/risk for bleeding.  Pt has not bled. She only eats cranberry on occasion; not regular part of diet. Cont. Same dose.  Return in 2 weeks. Marily Lente, Pharm.D.

## 2011-06-03 ENCOUNTER — Other Ambulatory Visit: Payer: Self-pay | Admitting: Cardiology

## 2011-06-05 ENCOUNTER — Ambulatory Visit (HOSPITAL_BASED_OUTPATIENT_CLINIC_OR_DEPARTMENT_OTHER): Payer: Medicare Other | Admitting: Pharmacist

## 2011-06-05 ENCOUNTER — Other Ambulatory Visit (HOSPITAL_BASED_OUTPATIENT_CLINIC_OR_DEPARTMENT_OTHER): Payer: Medicare Other | Admitting: Lab

## 2011-06-05 DIAGNOSIS — I82409 Acute embolism and thrombosis of unspecified deep veins of unspecified lower extremity: Secondary | ICD-10-CM

## 2011-06-05 LAB — POCT INR: INR: 2.6

## 2011-06-05 NOTE — Patient Instructions (Signed)
Continue 5mg  daily except 7.5mg  on MWF. Return to clinic in 2 weeks to recheck your INR.  06/19/11 @ 2pm.

## 2011-06-05 NOTE — Progress Notes (Signed)
  Pt took 7.5mg  daily except 5mg  on Sa & Su from 3/1 - 3/8 (as instructed). She missed one dose on 05/30/11.  Patient has been taking 5mg  daily except 7.5mg  on MWF (previous dose) from 3/10 - today. She feels 7.5mg  daily except 5mg  on Sa &Su may be too much for her. She would like to try 5mg  daily except 7.5mg  on MWF again and recheck INR in 2 weeks. Continue 5mg  daily except 7.5mg  on MWF. Return to clinic in 2 weeks to recheck your INR.  06/19/11 @ 2pm.

## 2011-06-08 ENCOUNTER — Other Ambulatory Visit: Payer: Self-pay | Admitting: Family Medicine

## 2011-06-19 ENCOUNTER — Other Ambulatory Visit: Payer: Medicare Other | Admitting: Lab

## 2011-06-19 ENCOUNTER — Ambulatory Visit (HOSPITAL_BASED_OUTPATIENT_CLINIC_OR_DEPARTMENT_OTHER): Payer: Medicare Other | Admitting: Pharmacist

## 2011-06-19 DIAGNOSIS — I824Z9 Acute embolism and thrombosis of unspecified deep veins of unspecified distal lower extremity: Secondary | ICD-10-CM

## 2011-06-19 DIAGNOSIS — I82409 Acute embolism and thrombosis of unspecified deep veins of unspecified lower extremity: Secondary | ICD-10-CM

## 2011-06-19 LAB — POCT INR: INR: 2.1

## 2011-06-19 LAB — PROTIME-INR
INR: 2.1 (ref 2.00–3.50)
Protime: 25.2 Seconds — ABNORMAL HIGH (ref 10.6–13.4)

## 2011-06-19 MED ORDER — WARFARIN SODIUM 2.5 MG PO TABS: ORAL_TABLET | ORAL | Status: DC

## 2011-06-19 NOTE — Progress Notes (Signed)
INR is therapeutic today.  Pt is maintained on 5 mg/day; 7.5 mg MWF. No complaints re: anticoagulation. Return in 1 month.  Marily Lente, Pharm.D.

## 2011-06-25 ENCOUNTER — Other Ambulatory Visit: Payer: Self-pay | Admitting: Pharmacist

## 2011-06-25 DIAGNOSIS — I82409 Acute embolism and thrombosis of unspecified deep veins of unspecified lower extremity: Secondary | ICD-10-CM

## 2011-06-25 MED ORDER — WARFARIN SODIUM 5 MG PO TABS: ORAL_TABLET | ORAL | Status: DC

## 2011-06-26 ENCOUNTER — Telehealth: Payer: Self-pay | Admitting: Cardiology

## 2011-06-26 DIAGNOSIS — E785 Hyperlipidemia, unspecified: Secondary | ICD-10-CM

## 2011-06-26 DIAGNOSIS — I2510 Atherosclerotic heart disease of native coronary artery without angina pectoris: Secondary | ICD-10-CM

## 2011-06-26 NOTE — Telephone Encounter (Signed)
Spoke with pt. Pt states that she needs authorization for Livalo. She states she has been on crestor in the past and it caused her to to have significant muscle pain/aches. Pt states she does not recall taking any other statin medications.

## 2011-06-26 NOTE — Telephone Encounter (Signed)
New Problem:    Patient called in wanting to let you know that her insurance is refusing to pay for her LIVALO 2 MG TABS  prescription and that they will be faxing over a sheet asking for either a change in her medication or the generic version of the medication.  Please call back.

## 2011-06-26 NOTE — Telephone Encounter (Signed)
Pt states her insurance company is faxing a prior authorization form to Dr Shirlee Latch to complete for [employer] authorization.

## 2011-06-26 NOTE — Telephone Encounter (Signed)
Pt is scheduling an appt with Dr Shirlee Latch. She is also scheduling an appt for fasting lipid/BMET (liver done 10/12) a few days before the appt with Dr Shirlee Latch.

## 2011-06-29 ENCOUNTER — Telehealth: Payer: Self-pay | Admitting: Cardiology

## 2011-06-29 NOTE — Telephone Encounter (Signed)
Will forward to [employer] for Dr Shirlee Latch

## 2011-06-29 NOTE — Telephone Encounter (Signed)
New Problem:    Called in needing a prior auth for LIVALO 2 MG TABS .

## 2011-06-30 ENCOUNTER — Telehealth: Payer: Self-pay | Admitting: Cardiology

## 2011-06-30 NOTE — Telephone Encounter (Signed)
Pt Dropped off [employer] Medicare Complete paper that Needs to be Completed, Sent to [place] for Completion 06/30/11/KM

## 2011-06-30 NOTE — Telephone Encounter (Signed)
Prior authorization form for Livalo from [employer] [phone] [phone] completed and signed by Dr Shirlee Latch. To HIM to be faxed. Pt aware.

## 2011-07-10 ENCOUNTER — Other Ambulatory Visit: Payer: Self-pay | Admitting: Family Medicine

## 2011-07-13 ENCOUNTER — Telehealth: Payer: Self-pay | Admitting: Family Medicine

## 2011-07-13 MED ORDER — ALPRAZOLAM 1 MG PO TABS: 1.0000 mg | ORAL_TABLET | Freq: Two times a day (BID) | ORAL | Status: DC | PRN

## 2011-07-13 NOTE — Telephone Encounter (Signed)
Patient called stating that she need a refill on her aprazolam because when she went for her refill it was expired. Please assist.

## 2011-07-13 NOTE — Telephone Encounter (Signed)
Alprazolam last filled 01-08-11, #60 with 5 refills Pt has not been in to see you lately, she has seen a lot of other providers

## 2011-07-13 NOTE — Telephone Encounter (Signed)
Refill OK

## 2011-07-14 NOTE — Telephone Encounter (Signed)
See phone note 06/26/11.

## 2011-07-14 NOTE — Telephone Encounter (Signed)
Spoke with [place] at [employer]. Livalo has been authorized until 02/2012. Spoke with pt and she is aware Consuelo Pandy has been authorized.

## 2011-07-17 ENCOUNTER — Other Ambulatory Visit (HOSPITAL_BASED_OUTPATIENT_CLINIC_OR_DEPARTMENT_OTHER): Payer: Medicare Other | Admitting: Lab

## 2011-07-17 ENCOUNTER — Ambulatory Visit (HOSPITAL_BASED_OUTPATIENT_CLINIC_OR_DEPARTMENT_OTHER): Payer: Medicare Other | Admitting: Pharmacist

## 2011-07-17 DIAGNOSIS — I82409 Acute embolism and thrombosis of unspecified deep veins of unspecified lower extremity: Secondary | ICD-10-CM

## 2011-07-17 LAB — PROTIME-INR: Protime: 28.8 Seconds — ABNORMAL HIGH (ref 10.6–13.4)

## 2011-07-17 NOTE — Progress Notes (Signed)
INR = 2.4 Pt will go up to [place], [place] from 5/10 - 6/22. Continue same dose & we'll check INR when she returns. She has a cardiology appt on 6/28 that will be a diagnostic test.  No need to come off coumadin per pt. Marily Lente, Pharm.D.

## 2011-07-22 ENCOUNTER — Telehealth: Payer: Self-pay | Admitting: Family Medicine

## 2011-07-22 NOTE — Telephone Encounter (Signed)
Alprazolam last filled on 07-13-11, #60 with 5 refills.  She is requesting one early refill authorization on 5/10 for an extended visit in [place].

## 2011-07-22 NOTE — Telephone Encounter (Signed)
Pt called and said that she is leaving to go up to [place] on 07/31/11. Pt has 5 refills remaining on her Alprazolam, but the pharmacy in [place] will not allow refill. Pt needs to get Dr Caryl Never to authorize an early refill, prior to pt leaving on 07/31/11. Pls call the [facility] in it for pt to get pick up refill early at the [facility] in [place].

## 2011-07-28 NOTE — Telephone Encounter (Signed)
Pt called back again to check on status of getting early refill of xanax for trip to [place]. Pt has enough to last until 08/12/11, but will be in [place] at that time and [place] will not fill script while she is there, because it [place] out of state. Pt leaving on 07/31/11 and is req to pick up refill before she leaves on Friday. Pt won't be returning until the last part of June.

## 2011-07-28 NOTE — Telephone Encounter (Signed)
Early refill request for alprazolam, pt going out of town for extended visit to [place], leaving Friday 5/10.  May I authorize on Thursday, 5/9.

## 2011-07-28 NOTE — Telephone Encounter (Signed)
Yes.  OK to refill. 

## 2011-07-30 NOTE — Telephone Encounter (Signed)
Pt informed early refill authorized at her pharmacy on [place]

## 2011-09-01 ENCOUNTER — Other Ambulatory Visit: Payer: Self-pay | Admitting: Cardiology

## 2011-09-14 ENCOUNTER — Other Ambulatory Visit (HOSPITAL_BASED_OUTPATIENT_CLINIC_OR_DEPARTMENT_OTHER): Payer: Medicare Other | Admitting: Lab

## 2011-09-14 ENCOUNTER — Ambulatory Visit (HOSPITAL_BASED_OUTPATIENT_CLINIC_OR_DEPARTMENT_OTHER): Payer: Medicare Other | Admitting: Pharmacist

## 2011-09-14 ENCOUNTER — Other Ambulatory Visit (INDEPENDENT_AMBULATORY_CARE_PROVIDER_SITE_OTHER): Payer: Medicare Other

## 2011-09-14 DIAGNOSIS — I82409 Acute embolism and thrombosis of unspecified deep veins of unspecified lower extremity: Secondary | ICD-10-CM

## 2011-09-14 DIAGNOSIS — E785 Hyperlipidemia, unspecified: Secondary | ICD-10-CM

## 2011-09-14 DIAGNOSIS — Z5181 Encounter for therapeutic drug level monitoring: Secondary | ICD-10-CM

## 2011-09-14 DIAGNOSIS — I2510 Atherosclerotic heart disease of native coronary artery without angina pectoris: Secondary | ICD-10-CM

## 2011-09-14 DIAGNOSIS — Z7901 Long term (current) use of anticoagulants: Secondary | ICD-10-CM

## 2011-09-14 DIAGNOSIS — I824Z9 Acute embolism and thrombosis of unspecified deep veins of unspecified distal lower extremity: Secondary | ICD-10-CM

## 2011-09-14 LAB — POCT INR: INR: 2.8

## 2011-09-14 LAB — LIPID PANEL
Cholesterol: 124 mg/dL (ref 0–200)
HDL: 52.4 mg/dL (ref >39.00)
LDL Cholesterol: 54 mg/dL (ref 0–99)
Total CHOL/HDL Ratio: 2
Triglycerides: 90 mg/dL (ref 0.0–149.0)

## 2011-09-14 LAB — BASIC METABOLIC PANEL
CO2: 27 mEq/L (ref 19–32)
Calcium: 9.1 mg/dL (ref 8.4–10.5)
Chloride: 109 mEq/L (ref 96–112)
Sodium: 142 mEq/L (ref 135–145)

## 2011-09-14 LAB — PROTIME-INR

## 2011-09-14 NOTE — Patient Instructions (Signed)
Continue 5mg  daily except 7.5mg  on MWF.  Recheck INR in 1 month, 10/12/11 @ 2 pm for lab; 2:15 pm for coumadin clinic.

## 2011-09-14 NOTE — Progress Notes (Signed)
Continue 5mg daily except 7.5mg on MWF.  Recheck INR in 1 month, 10/12/11 @ 2 pm for lab; 2:15 pm for coumadin clinic. 

## 2011-09-16 ENCOUNTER — Ambulatory Visit (INDEPENDENT_AMBULATORY_CARE_PROVIDER_SITE_OTHER): Payer: Medicare Other | Admitting: Cardiology

## 2011-09-16 ENCOUNTER — Encounter: Payer: Self-pay | Admitting: Cardiology

## 2011-09-16 VITALS — BP 108/60 | HR 74 | Ht 67.0 in | Wt 253.0 lb

## 2011-09-16 DIAGNOSIS — I428 Other cardiomyopathies: Secondary | ICD-10-CM

## 2011-09-16 DIAGNOSIS — I420 Dilated cardiomyopathy: Secondary | ICD-10-CM | POA: Insufficient documentation

## 2011-09-16 DIAGNOSIS — I2510 Atherosclerotic heart disease of native coronary artery without angina pectoris: Secondary | ICD-10-CM

## 2011-09-16 DIAGNOSIS — E785 Hyperlipidemia, unspecified: Secondary | ICD-10-CM

## 2011-09-16 DIAGNOSIS — I82409 Acute embolism and thrombosis of unspecified deep veins of unspecified lower extremity: Secondary | ICD-10-CM

## 2011-09-16 DIAGNOSIS — I429 Cardiomyopathy, unspecified: Secondary | ICD-10-CM

## 2011-09-16 NOTE — Progress Notes (Signed)
Patient ID: [name], female   DOB: 19-Jul-1945, 66 y.o.   MRN: [id number] PCP: Dr. Caryl Never  66 yo with history of retroperitoneal fibrosis and non-Hodgkin's lymphoma as well as prior DVT presented initially for evaluation of an abnormal myoview. Patient had extreme fatigue for months. She has thought that this was due to depression. She has had no chest pain. She does not get short of breath with her usual activities (housework, going up steps). She gets a little short of breath if she walks up a hill. Given her fatigue, a Lexiscan myoview was done in 1/11. This was an equivocal study due to subdiaphragmatic nuclear uptake. EF was calculated to be 49% with global hypokinesis.  Given the equivocal myoview, I had her do a coronary CT angiogram. This showed a moderate calcium score of 161 (putting her at a high risk percentile for her age) and a possible moderate mid LAD stenosis (though some degradation of images by respiratory artifact). Echo was done to reassess LV systolic function (reported as mildly decreased on myoview). This confirmed mild LV systolic dysfunction, EF 45-50% with moderate diastolic dysfunction. We discussed catheterization, but patient strongly preferred medical treatment.   I have not seen [name] for about a year and a half.  She seems to have done well symptomatically.  No chest pain or pressure.  She is short of breath walking up steps or up and incline.  This has been stable.  She has some vertigo-type symptoms consistent with BPV on occasion when she turns her head a particular way.  Her weight is down 3 lbs since last appointment. She is tolerating Livalo.   Labs (12/10): HCT 34.5, K 4.3, creatinine 1.2, LDL 101, HDL 46  Labs (7/11): LFTs normal, LDL 69, HDL 47, BNP 21, K 4.3, creatinine 1.2  Labs (1/12): K 5.2, creatinine 1.0, LFTs normal, LDL 59, HDL 46  Labs (6/13): LDL 54, HDL 52, K 4.7, creatinine 1.1  Past Medical History:   1. Anxiety Disorder  2.  Arthritis  3. Depression  4. Retroperitoneal fibrosis with non-Hodgkin's lymphoma. This was treated with chemotherapy in 2006 and is in remission.  5. Adenomatous Colon Polyps  6. Diverticulosis  7. DVT left leg/thigh: in both 2005 and 2006, associated with lymphoma. She is on chronic coumadin.  8. Obesity  9. Left ureteral stent (ureter involved by retroperitoneal fibrosis).  10. CAD: Lexiscan myoview (1/11): EF 49% with global hypokinesis. There appeared to be a mild anterior perfusion defect that could represent ischemia. However, there was significant subdiaphragmatic nuclear activity that was worse in stress than rest so it is possible that the defect was artifactual. This study was equivocal. Coronary CT angiogram was done to follow this up in 2/11. This showed calcium score of 161 (86th percentile for her age and race) and a possible moderate mid LAD stenosis.  11. Mild systolic dysfunction: EF 49% on myoview. Echo (2/11): EF 45-50%, moderate diastolic dysfunction, mild MR, mild biatrial enlargement, PASP 34 mmHg. It is possible that this could be chemotherapy-related.  12. Hyperlipidemia: myalgias with Crestor.   Family History:  Family History of Arthritis mother  Family History Diabetes 1st degree relative deceased sister  Family History Hypertension deceased sister  Family History of Stroke F 1st degree relative <60 deceased sister  Family History of Cardiovascular disorder mother  Sister had MI and CVA at 37  Mother with CABG in her 42s   Social History:  Retired  Married  Quit smoking 5/10  Alcohol  use-yes, rarely  Regular exercise-no   Review of Systems  All systems reviewed and negative except as per HPI.   Current Outpatient Prescriptions  Medication Sig Dispense Refill  . ALPRAZolam (XANAX) 1 MG tablet Take 1 tablet (1 mg total) by mouth 2 (two) times daily as needed for sleep.  60 tablet  5  . buPROPion (WELLBUTRIN XL) 300 MG 24 hr tablet take 1 tablet by mouth  once daily  90 tablet  1  . chlorhexidine (PERIDEX) 0.12 % solution 5 mLs by Mouth Rinse route Twice daily.      . cholecalciferol (VITAMIN D) 400 UNITS TABS Take 400 Units by mouth daily.       . citalopram (CELEXA) 40 MG tablet take 1 tablet by mouth once daily  90 tablet  2  . co-enzyme Q-10 30 MG capsule Take 30 mg by mouth daily.       . fluticasone (FLONASE) 50 MCG/ACT nasal spray instill 2 sprays into each nostril once daily  32 g  6  . lisinopril (PRINIVIL,ZESTRIL) 5 MG tablet take 1 tablet by mouth once daily  90 tablet  1  . LIVALO 2 MG TABS take 1 tablet by mouth once daily  30 tablet  0  . loratadine (CLARITIN) 10 MG tablet Take 10 mg by mouth daily.       . nitroGLYCERIN (NITROSTAT) 0.4 MG SL tablet Place 0.4 mg under the tongue every 5 (five) minutes as needed.        Marland Kitchen omeprazole (PRILOSEC) 20 MG capsule Take 20 mg by mouth every morning.      . Pyridoxine HCl (VITAMIN B-6) 250 MG tablet Take 250 mg by mouth daily.      Marland Kitchen warfarin (COUMADIN) 2.5 MG tablet Take 5 mg daily except 7.5 mg on MWF or as directed  72 tablet  3  . warfarin (COUMADIN) 5 MG tablet Take 5 mg daily except 7.5 mg on MWF or as directed  36 tablet  3  . warfarin (COUMADIN) 7.5 MG tablet Take 7.5 mg by mouth daily. M/W/F       BP 108/60  Pulse 74  Ht 5\' 7"  (1.702 m)  Wt 114.76 kg (253 lb)  BMI 39.63 kg/m2  SpO2 98% General: NAD, obese.  Neck: No JVD, no thyromegaly or thyroid nodule.  Lungs: Clear to auscultation bilaterally with normal respiratory effort. CV: Nondisplaced PMI.  Heart regular S1/S2, no S3/S4, no murmur.  No peripheral edema.  No carotid bruit.  Normal pedal pulses.  Abdomen: Soft, nontender, no hepatosplenomegaly, no distention.  Neurologic: Alert and oriented x 3.  Psych: Normal affect. Extremities: No clubbing or cyanosis.

## 2011-09-16 NOTE — Assessment & Plan Note (Signed)
Goal LDL < 70 given coronary artery calcium and equivocal myoview and coronary CTA.

## 2011-09-16 NOTE — Assessment & Plan Note (Signed)
On chronic coumadin

## 2011-09-16 NOTE — Assessment & Plan Note (Signed)
Last echo with EF 45-50%.  She has some exertional dyspnea that may be due to obesity/deconditioning.  I will repeat and echo to reassess LV systolic function.  If EF remains low, would plan to add a low dose of Coreg.

## 2011-09-16 NOTE — Assessment & Plan Note (Addendum)
Equivocal myoview and coronary CT.  Could all be artifactual due to soft tissue.  However, coronary artery calcium score placed her in a high risk category.  We discussed catheterization but she opted for medical treatment and seems to have done fairly well with it.  Continue statin and lisinopril.  She can stop ASA as she is already taking warfarin.

## 2011-09-16 NOTE — Patient Instructions (Addendum)
Stop aspirin.  Your physician has requested that you have an echocardiogram. Echocardiography is a painless test that uses sound waves to create images of your heart. It provides your doctor with information about the size and shape of your heart and how well your heart's chambers and valves are working. This procedure takes approximately one hour. There are no restrictions for this procedure.  Your physician wants you to follow-up in: 1 year with Dr Shirlee Latch. (June 2014). You will receive a reminder letter in the mail two months in advance. If you don't receive a letter, please call our office to schedule the follow-up appointment.

## 2011-09-18 ENCOUNTER — Other Ambulatory Visit (HOSPITAL_COMMUNITY): Payer: Self-pay | Admitting: Urology

## 2011-09-18 DIAGNOSIS — N1330 Unspecified hydronephrosis: Secondary | ICD-10-CM

## 2011-09-21 ENCOUNTER — Encounter (HOSPITAL_COMMUNITY)
Admission: RE | Admit: 2011-09-21 | Discharge: 2011-09-21 | Disposition: A | Discharge status: Home / Self Care | Payer: Medicare Other | Source: Ambulatory Visit | Attending: Urology | Admitting: Urology

## 2011-09-21 DIAGNOSIS — N1330 Unspecified hydronephrosis: Secondary | ICD-10-CM | POA: Insufficient documentation

## 2011-09-21 MED ORDER — TECHNETIUM TC 99M MERTIATIDE
15.8000 | Freq: Once | INTRAVENOUS | Status: AC | PRN
  Administered 2011-09-21: 16 via INTRAVENOUS

## 2011-09-21 MED ORDER — FUROSEMIDE 10 MG/ML IJ SOLN
40.0000 mg | Freq: Once | INTRAMUSCULAR | Status: DC
  Filled 2011-09-21: qty 4

## 2011-09-23 ENCOUNTER — Other Ambulatory Visit (HOSPITAL_COMMUNITY): Payer: Medicare Other

## 2011-09-29 ENCOUNTER — Ambulatory Visit (HOSPITAL_COMMUNITY): Payer: Medicare Other | Attending: Cardiology

## 2011-09-29 DIAGNOSIS — I2510 Atherosclerotic heart disease of native coronary artery without angina pectoris: Secondary | ICD-10-CM

## 2011-09-29 DIAGNOSIS — C8589 Other specified types of non-Hodgkin lymphoma, extranodal and solid organ sites: Secondary | ICD-10-CM | POA: Insufficient documentation

## 2011-09-29 DIAGNOSIS — I517 Cardiomegaly: Secondary | ICD-10-CM | POA: Insufficient documentation

## 2011-09-29 DIAGNOSIS — Z86718 Personal history of other venous thrombosis and embolism: Secondary | ICD-10-CM | POA: Insufficient documentation

## 2011-09-29 DIAGNOSIS — E785 Hyperlipidemia, unspecified: Secondary | ICD-10-CM | POA: Insufficient documentation

## 2011-09-29 DIAGNOSIS — R5381 Other malaise: Secondary | ICD-10-CM | POA: Insufficient documentation

## 2011-09-29 DIAGNOSIS — E669 Obesity, unspecified: Secondary | ICD-10-CM | POA: Insufficient documentation

## 2011-09-29 DIAGNOSIS — I428 Other cardiomyopathies: Secondary | ICD-10-CM | POA: Insufficient documentation

## 2011-09-29 NOTE — Progress Notes (Signed)
Echocardiogram performed.  

## 2011-10-01 ENCOUNTER — Other Ambulatory Visit: Payer: Self-pay | Admitting: *Deleted

## 2011-10-01 MED ORDER — PITAVASTATIN CALCIUM 2 MG PO TABS: 2.0000 mg | ORAL_TABLET | Freq: Every day | ORAL | Status: DC

## 2011-10-12 ENCOUNTER — Other Ambulatory Visit: Payer: Medicare Other | Admitting: Lab

## 2011-10-12 ENCOUNTER — Ambulatory Visit: Payer: Medicare Other

## 2011-10-12 ENCOUNTER — Telehealth: Payer: Self-pay | Admitting: Pharmacist

## 2011-10-17 ENCOUNTER — Other Ambulatory Visit: Payer: Self-pay | Admitting: Cardiology

## 2011-11-13 ENCOUNTER — Ambulatory Visit (HOSPITAL_BASED_OUTPATIENT_CLINIC_OR_DEPARTMENT_OTHER): Payer: Medicare Other | Admitting: Pharmacist

## 2011-11-13 ENCOUNTER — Other Ambulatory Visit (HOSPITAL_BASED_OUTPATIENT_CLINIC_OR_DEPARTMENT_OTHER): Payer: Medicare Other | Admitting: Lab

## 2011-11-13 DIAGNOSIS — I82409 Acute embolism and thrombosis of unspecified deep veins of unspecified lower extremity: Secondary | ICD-10-CM

## 2011-11-13 DIAGNOSIS — I824Z9 Acute embolism and thrombosis of unspecified deep veins of unspecified distal lower extremity: Secondary | ICD-10-CM

## 2011-11-13 LAB — PROTIME-INR
INR: 3.6 — ABNORMAL HIGH (ref 2.00–3.50)
Protime: 43.2 Seconds — ABNORMAL HIGH (ref 10.6–13.4)

## 2011-11-13 NOTE — Progress Notes (Signed)
Hold Coumadin today. Resume 5mg daily except 7.5mg on MWF on 11/14/11.  Recheck INR in 2 weeks, 11/27/11 @ 2 pm for lab; 2:15 pm for coumadin clinic. 

## 2011-11-13 NOTE — Patient Instructions (Signed)
Hold Coumadin today. Resume 5mg  daily except 7.5mg  on MWF on 11/14/11.  Recheck INR in 2 weeks, 11/27/11 @ 2 pm for lab; 2:15 pm for coumadin clinic.

## 2011-11-27 ENCOUNTER — Other Ambulatory Visit (HOSPITAL_BASED_OUTPATIENT_CLINIC_OR_DEPARTMENT_OTHER): Payer: Medicare Other | Admitting: Lab

## 2011-11-27 ENCOUNTER — Ambulatory Visit (HOSPITAL_BASED_OUTPATIENT_CLINIC_OR_DEPARTMENT_OTHER): Payer: Medicare Other | Admitting: Pharmacist

## 2011-11-27 DIAGNOSIS — I82409 Acute embolism and thrombosis of unspecified deep veins of unspecified lower extremity: Secondary | ICD-10-CM

## 2011-11-27 DIAGNOSIS — I824Z9 Acute embolism and thrombosis of unspecified deep veins of unspecified distal lower extremity: Secondary | ICD-10-CM

## 2011-11-27 NOTE — Progress Notes (Signed)
INR therapeutic today (2.5) on 5mg  daily except 7.5mg  on MWF. Skipped 1 dose as instructed 2 weeks ago.  No problems with bleeding or bruising.  On doxycycline 100mg  daily for a second course that should last 2 weeks. Will have pt continue current dose, and recheck INR in 2 weeks to closely monitor her INRs while she is on doxycycline (which can increase her INR).

## 2011-12-02 ENCOUNTER — Other Ambulatory Visit: Payer: Self-pay | Admitting: Pharmacist

## 2011-12-02 NOTE — Telephone Encounter (Signed)
Called in Rx for:  Coumadin 2.5mg  tablet 1 PO on MWF or as directed #45, 3 RF MD: Odogwu  Coumadin 5mg  tablet 1 PO daily or as directed #90, 3 RF MD: Odogwu

## 2011-12-08 ENCOUNTER — Other Ambulatory Visit: Payer: Self-pay | Admitting: Cardiology

## 2011-12-10 ENCOUNTER — Other Ambulatory Visit: Payer: Medicare Other | Admitting: Lab

## 2011-12-10 ENCOUNTER — Ambulatory Visit: Payer: Medicare Other

## 2011-12-11 ENCOUNTER — Other Ambulatory Visit: Payer: Self-pay | Admitting: Family Medicine

## 2011-12-24 ENCOUNTER — Other Ambulatory Visit (HOSPITAL_BASED_OUTPATIENT_CLINIC_OR_DEPARTMENT_OTHER): Payer: Medicare Other | Admitting: Lab

## 2011-12-24 ENCOUNTER — Ambulatory Visit (HOSPITAL_BASED_OUTPATIENT_CLINIC_OR_DEPARTMENT_OTHER): Payer: Medicare Other | Admitting: Pharmacist

## 2011-12-24 DIAGNOSIS — I82409 Acute embolism and thrombosis of unspecified deep veins of unspecified lower extremity: Secondary | ICD-10-CM

## 2011-12-24 DIAGNOSIS — I824Z9 Acute embolism and thrombosis of unspecified deep veins of unspecified distal lower extremity: Secondary | ICD-10-CM

## 2011-12-24 LAB — PROTIME-INR: INR: 2 (ref 2.00–3.50)

## 2011-12-24 NOTE — Progress Notes (Signed)
No changes.  Pt INR still stable on current coumadin dose.  Cont Coumadin 7.5mg  MWF and 5mg  other days.  Check PT/INR in 1 month.

## 2011-12-31 ENCOUNTER — Encounter: Payer: Self-pay | Admitting: Family Medicine

## 2011-12-31 ENCOUNTER — Ambulatory Visit (INDEPENDENT_AMBULATORY_CARE_PROVIDER_SITE_OTHER): Payer: Medicare Other | Admitting: Family Medicine

## 2011-12-31 VITALS — BP 120/70 | Temp 98.0°F | Wt 250.0 lb

## 2011-12-31 DIAGNOSIS — R05 Cough: Secondary | ICD-10-CM

## 2011-12-31 DIAGNOSIS — I82409 Acute embolism and thrombosis of unspecified deep veins of unspecified lower extremity: Secondary | ICD-10-CM

## 2011-12-31 DIAGNOSIS — Z23 Encounter for immunization: Secondary | ICD-10-CM

## 2011-12-31 DIAGNOSIS — M1990 Unspecified osteoarthritis, unspecified site: Secondary | ICD-10-CM

## 2011-12-31 DIAGNOSIS — F329 Major depressive disorder, single episode, unspecified: Secondary | ICD-10-CM

## 2011-12-31 MED ORDER — ALPRAZOLAM 1 MG PO TABS: 1.0000 mg | ORAL_TABLET | Freq: Two times a day (BID) | ORAL | Status: DC

## 2011-12-31 MED ORDER — CITALOPRAM HYDROBROMIDE 40 MG PO TABS: 40.0000 mg | ORAL_TABLET | Freq: Every day | ORAL | Status: DC

## 2011-12-31 MED ORDER — BUPROPION HCL ER (XL) 300 MG PO TB24: 300.0000 mg | ORAL_TABLET | Freq: Every day | ORAL | Status: DC

## 2011-12-31 MED ORDER — OXYCODONE HCL 5 MG PO TABS: 5.0000 mg | ORAL_TABLET | ORAL | Status: DC | PRN

## 2011-12-31 NOTE — Progress Notes (Signed)
Subjective:    Patient ID: [name], female    DOB: 08-Jan-1946, 66 y.o.   MRN: [id number]  HPI  Patient seen for medical followup. She has multiple chronic problems including history obesity, CAD, cardiomyopathy, recurrent depression, recurrent DVT, osteoarthritis, and history of lymphoma. Overall relatively stable. She is here to discuss several issues as follows  Patient requesting determining the status of immunity to varicella. She does not recall ever having chickenpox. She was exposed to sister and all 3 of her children had chickenpox but she is sure she never had this. She's never had testing for varicella IgG.   She's had chronic dry cough which she attributes to lisinopril. She states she plans to stop this after she finishes current prescription. No dyspnea. No hemoptysis. No weight loss. No fever.  Recurrent upper back pain. Patient avoids nonsteroidals secondary to Coumadin. She is requesting refill oxycodone which she rarely takes for musculoskeletal pain when this is severe. She has used very sparingly in the past.  Recurrent depression. Currently stable on combination of Wellbutrin and citalopram. Requesting refills.  Past Medical History  Diagnosis Date  . DYSLIPIDEMIA 02/27/2009  . DEPRESSION 06/13/2008  . ALLERGIC RHINITIS 08/01/2009  . ABDOMINAL PAIN RIGHT UPPER QUADRANT 11/08/2008  . CARDIOVASCULAR FUNCTION STUDY, ABNORMAL 04/04/2009  . NON-HODGKIN'S LYMPHOMA, HX OF dx  2005--  chemoradiation completed 2006--  no recurrence    onocologist- dr odogwu--   . OSTEOARTHRITIS, MODERATE 04/07/2010  . CAD, NATIVE VESSEL 05/30/2009    cardiologist- dr Shirlee Latch- visit 04-13-2010 in epic-- denies cardiac symptoms  . DYSPNEA 02/27/2009  . Retroperitoneal fibrosis   . Frequency of urination   . History of Lyme disease   . History of Bell's palsy   . DDD (degenerative disc disease), lumbar   . GERD (gastroesophageal reflux disease)   . Anxiety   . Hydronephrosis, left  chronic -- secondary to retroperitoneal fibrosis  . History of DVT of lower extremity 2006-- CHRONIC COUMADIN THERAPY  . Anticoagulated on Coumadin    Past Surgical History  Procedure Date  . Multiple cysto/ left ureteral stent exchanges LAST ONE 10-28-10  . Total knee arthroplasty OCT 2010    RIGHT  . Total knee arthroplasty JAN 2010    LEFT  . Knee arthroscopy 2005    RIGHT  . Breast mass excision     BENIGN-- RIGHT BREAST  . Retroperitoneal bx 2007  . Exploratory laparotomy 2005    LYMPHOMA  . Tonsillectomy CHILD  . Cardiovascular stress test 04-04-2009-- PER PT ASYMPTOMATIC-- MEDICAL MANAGEMENT    STUDY WAS EQUIVOCAL/ EF 49%/ GLOBAL HYPOKINESIS/ APPEARS TO BE A MILD ANTERIOR PERFUSION DEFECT COULD REPRESENT ISCHEMIA BUT DUE TO  ACTIVITY WORSE IN STRESS THAN AT REST POSS. THE DEFECT WAS ARTIFACTUAL  . Transthoracic echocardiogram 05-03-2009    MILD SYSTOLIC DYSFUNCTION/ EF 45-50%/ MODERATE DIASTOLIC DYSFUCTION/ MILD MR/ MILD BIATRIAL ENLARGEMENT  . Ct angiogram FEB 2011    CALCIUM SCORE 161/ POSS. MODERATE MID LAD STENOSIS  . Cystoscopy w/ ureteral stent removal 04/28/2011    Procedure: CYSTOSCOPY WITH STENT REMOVAL;  Surgeon: Angela Haste, MD;  Location: [facility];  Service: Urology;;  . Cystoscopy w/ retrogrades 04/28/2011    Procedure: CYSTOSCOPY WITH RETROGRADE PYELOGRAM;  Surgeon: Angela Haste, MD;  Location: [facility];  Service: Urology;  Laterality: Left;    reports that she quit smoking about 3 years ago. Her smoking use included Cigarettes. She has a 104 pack-year smoking history. She has never  used smokeless tobacco. She reports that she drinks alcohol. She reports that she does not use illicit drugs. family history includes Arthritis in her mother; Diabetes in her sister; Heart disease in her mother; Hypertension in her sister; and Stroke in her sister. Allergies  Allergen Reactions  . Chlorhexidine Base Itching     CHG WIPES  . Penicillins Hives      Review of Systems  Constitutional: Negative for fever, chills and unexpected weight change.  Respiratory: Positive for cough. Negative for shortness of breath and wheezing.   Cardiovascular: Negative for chest pain, palpitations and leg swelling.  Gastrointestinal: Negative for abdominal pain.  Genitourinary: Negative for dysuria.  Musculoskeletal: Positive for arthralgias.  Neurological: Negative for dizziness, weakness and headaches.  Hematological: Negative for adenopathy. Does not bruise/bleed easily.  Psychiatric/Behavioral: Negative for confusion.       Objective:   Physical Exam  Constitutional: She is oriented to person, place, and time. She appears well-developed and well-nourished.  HENT:  Right Ear: External ear normal.  Left Ear: External ear normal.  Neck: Neck supple. No thyromegaly present.  Cardiovascular: Normal rate and regular rhythm.   Pulmonary/Chest: Effort normal and breath sounds normal. No respiratory distress. She has no wheezes. She has no rales.  Musculoskeletal: She exhibits no edema.  Lymphadenopathy:    She has no cervical adenopathy.  Neurological: She is alert and oriented to person, place, and time. No cranial nerve deficit.          Assessment & Plan:  #1 history of recurrent depression. Stable. Refill medications for one year  #2 questionable status of varicella immunity. Check Varicella IgG antibody #3 dry cough. Patient will discuss with cardiologist regarding discontinuation of ACE inhibitor.  #4 history of osteoarthritis. She uses oxycodone very limited and refill for 5 mg #30 with no further refills given.  #5 health maintenance. Patient has already received flu vaccine. Received Pneumovax last year.  #6 history of recurrent DVT. Chronic Coumadin followed by hematology

## 2012-01-01 LAB — VARICELLA ZOSTER ANTIBODY, IGG: Varicella IgG: 1.89 {ISR} — ABNORMAL HIGH (ref <0.90)

## 2012-01-04 NOTE — Progress Notes (Signed)
Quick Note:  Pt informed, and is interested in shingles vaccine, checking into $ now and will call pt. ______

## 2012-01-13 ENCOUNTER — Telehealth: Payer: Self-pay | Admitting: Pharmacist

## 2012-01-29 ENCOUNTER — Ambulatory Visit (HOSPITAL_BASED_OUTPATIENT_CLINIC_OR_DEPARTMENT_OTHER): Payer: Medicare Other | Admitting: Pharmacist

## 2012-01-29 ENCOUNTER — Other Ambulatory Visit (HOSPITAL_BASED_OUTPATIENT_CLINIC_OR_DEPARTMENT_OTHER): Payer: Medicare Other | Admitting: Lab

## 2012-01-29 DIAGNOSIS — I82409 Acute embolism and thrombosis of unspecified deep veins of unspecified lower extremity: Secondary | ICD-10-CM

## 2012-01-29 LAB — POCT INR: INR: 3.3

## 2012-01-29 LAB — PROTIME-INR: Protime: 39.6 Seconds — ABNORMAL HIGH (ref 10.6–13.4)

## 2012-01-29 NOTE — Progress Notes (Signed)
INR = 3.3 on 5 mg/day; 7.5 mg MWF Small bruises R forearm.  No other complaints. Diet stable.   Has cough she believes is secondary to Lisinopril.  She may switch antihypertensives after she completes the supply she has of Lisinopril. Her cold is gone & she is off Zicam nasal spray. INR slightly supratherapeutic today.  She has been very stable on this current dose of Coumadin so rather than changing her stable dose, I have instructed her to take 5 mg today only (rather than usual 7.5 mg).  She will resume usual dose after today. Return in 1 month. She will call us if she changes to a different antihypertensive. Marily Lente, Pharm.D.

## 2012-02-11 ENCOUNTER — Other Ambulatory Visit: Payer: Self-pay | Admitting: Family Medicine

## 2012-02-23 ENCOUNTER — Encounter: Payer: Self-pay | Admitting: Family

## 2012-02-23 ENCOUNTER — Ambulatory Visit: Payer: Medicare Other | Admitting: Family

## 2012-02-23 ENCOUNTER — Ambulatory Visit (INDEPENDENT_AMBULATORY_CARE_PROVIDER_SITE_OTHER): Payer: Medicare Other | Admitting: Family

## 2012-02-23 VITALS — BP 126/88 | HR 80 | Temp 97.9°F | Wt 245.0 lb

## 2012-02-23 DIAGNOSIS — Z79899 Other long term (current) drug therapy: Secondary | ICD-10-CM

## 2012-02-23 DIAGNOSIS — W19XXXA Unspecified fall, initial encounter: Secondary | ICD-10-CM

## 2012-02-23 DIAGNOSIS — I809 Phlebitis and thrombophlebitis of unspecified site: Secondary | ICD-10-CM

## 2012-02-23 DIAGNOSIS — Y92009 Unspecified place in unspecified non-institutional (private) residence as the place of occurrence of the external cause: Secondary | ICD-10-CM

## 2012-02-23 MED ORDER — METHYLPREDNISOLONE ACETATE 40 MG/ML IJ SUSP
40.0000 mg | Freq: Once | INTRAMUSCULAR | Status: AC
  Administered 2012-02-23: 40 mg via INTRAMUSCULAR

## 2012-02-23 NOTE — Progress Notes (Signed)
Subjective:    Patient ID: [name], female    DOB: 1945/08/29, 66 y.o.   MRN: [id number]  HPI 66 year old white female, nonsmoker, patient of Dr. Caryl Never is in today with complaints of left leg skin pain a 1 day.  Patient sustained a fall 5 days ago at home when she tripped over her dog.  Reports having several falls related to tripping over her dog but does not want to get rid of it. Denies any neck or head injury. Overall, her symptoms have improved significantly since her original fall.    Review of Systems  Respiratory: Negative.   Cardiovascular: Negative.   Gastrointestinal: Negative.   Musculoskeletal: Negative.   Skin: Negative.        Bruise to the left upper leg and lower left leg.   Neurological: Negative.   Hematological: Negative.   Psychiatric/Behavioral: Negative.    Past Medical History  Diagnosis Date  . DYSLIPIDEMIA 02/27/2009  . DEPRESSION 06/13/2008  . ALLERGIC RHINITIS 08/01/2009  . ABDOMINAL PAIN RIGHT UPPER QUADRANT 11/08/2008  . CARDIOVASCULAR FUNCTION STUDY, ABNORMAL 04/04/2009  . NON-HODGKIN'S LYMPHOMA, HX OF dx  2005--  chemoradiation completed 2006--  no recurrence    onocologist- dr odogwu--   . OSTEOARTHRITIS, MODERATE 04/07/2010  . CAD, NATIVE VESSEL 05/30/2009    cardiologist- dr Shirlee Latch- visit 04-13-2010 in epic-- denies cardiac symptoms  . DYSPNEA 02/27/2009  . Retroperitoneal fibrosis   . Frequency of urination   . History of Lyme disease   . History of Bell's palsy   . DDD (degenerative disc disease), lumbar   . GERD (gastroesophageal reflux disease)   . Anxiety   . 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 History of DVT of lower extremity 2006-- CHRONIC COUMADIN THERAPY  . Anticoagulated on Coumadin     History   Social History  . Marital Status: Married    Spouse Name: N/A    Number of Children: N/A  . Years of Education: N/A   Occupational History  . Not on file.   Social History Main  Topics  . Smoking status: Former Smoker -- 2.0 packs/day for 52 years    Types: Cigarettes    Quit date: 03/24/2008  . Smokeless tobacco: Never Used  . Alcohol Use: Yes     Comment: RARE  . Drug Use: No  . Sexually Active: Not on file   Other Topics Concern  . Not on file   Social History Narrative  . No narrative on file    Past Surgical History  Procedure Date  . Multiple cysto/ left ureteral stent exchanges LAST ONE 10-28-10  . Total knee arthroplasty OCT 2010    RIGHT  . Total knee arthroplasty JAN 2010    LEFT  . Knee arthroscopy 2005    RIGHT  . Breast mass excision     BENIGN-- RIGHT BREAST  . Retroperitoneal bx 2007  . Exploratory laparotomy 2005    LYMPHOMA  . Tonsillectomy CHILD  . Cardiovascular stress test 04-04-2009-- PER PT ASYMPTOMATIC-- MEDICAL MANAGEMENT    STUDY WAS EQUIVOCAL/ EF 49%/ GLOBAL HYPOKINESIS/ APPEARS TO BE A MILD ANTERIOR PERFUSION DEFECT COULD REPRESENT ISCHEMIA BUT DUE TO  ACTIVITY WORSE IN STRESS THAN AT REST POSS. THE DEFECT WAS ARTIFACTUAL  . Transthoracic echocardiogram 05-03-2009    MILD SYSTOLIC DYSFUNCTION/ EF 45-50%/ MODERATE DIASTOLIC DYSFUCTION/ MILD MR/ MILD BIATRIAL ENLARGEMENT  . Ct angiogram FEB 2011    CALCIUM SCORE 161/ POSS. MODERATE MID LAD STENOSIS  . Cystoscopy  w/ ureteral stent removal 04/28/2011    Procedure: CYSTOSCOPY WITH STENT REMOVAL;  Surgeon: Antony Haste, MD;  Location: [facility];  Service: Urology;;  . Cystoscopy w/ retrogrades 04/28/2011    Procedure: CYSTOSCOPY WITH RETROGRADE PYELOGRAM;  Surgeon: Antony Haste, MD;  Location: [facility];  Service: Urology;  Laterality: Left;    Family History  Problem Relation Age of Onset  . Arthritis Mother   . Heart disease Mother   . Diabetes Sister   . Hypertension Sister   . Stroke Sister     Allergies  Allergen Reactions  . Chlorhexidine Base Itching    CHG WIPES  . Penicillins Hives    Current  Outpatient Prescriptions on File Prior to Visit  Medication Sig Dispense Refill  . ALPRAZolam (XANAX) 1 MG tablet Take 1 tablet (1 mg total) by mouth 2 (two) times daily.  60 tablet  5  . buPROPion (WELLBUTRIN XL) 300 MG 24 hr tablet Take 1 tablet (300 mg total) by mouth daily.  90 tablet  3  . chlorhexidine (PERIDEX) 0.12 % solution 5 mLs by Mouth Rinse route Twice daily.      . cholecalciferol (VITAMIN D) 400 UNITS TABS Take 400 Units by mouth daily.       . citalopram (CELEXA) 40 MG tablet Take 1 tablet (40 mg total) by mouth daily.  90 tablet  3  . co-enzyme Q-10 30 MG capsule Take 30 mg by mouth daily.       . fluticasone (FLONASE) 50 MCG/ACT nasal spray instill 2 sprays into each nostril once daily  16 g  6  . lisinopril (PRINIVIL,ZESTRIL) 5 MG tablet take 1 tablet by mouth once daily  90 tablet  1  . loratadine (CLARITIN) 10 MG tablet Take 10 mg by mouth daily.       Marland Kitchen NITROSTAT 0.4 MG SL tablet PLACE 1 TAB UNDER TONGUE.Marland Kitchen TAKE TABS 5 MINS APART.. UP TO 3 TABS  25 each  12  . omeprazole (PRILOSEC) 20 MG capsule Take 20 mg by mouth every morning.      Marland Kitchen oxyCODONE (OXY IR/ROXICODONE) 5 MG immediate release tablet Take 1 tablet (5 mg total) by mouth every 4 (four) hours as needed for pain.  30 tablet  0  . Pitavastatin Calcium 2 MG TABS Take 1 tablet (2 mg total) by mouth daily.  30 tablet  11  . warfarin (COUMADIN) 2.5 MG tablet Take 5 mg daily except 7.5 mg on MWF or as directed  72 tablet  3  . warfarin (COUMADIN) 5 MG tablet Take 5 mg daily except 7.5 mg on MWF or as directed  36 tablet  3   No current facility-administered medications on file prior to visit.    BP 126/88  Pulse 80  Temp 97.9 F (36.6 C) (Oral)  Wt 245 lb (111.131 kg)  SpO2 92%chart    Objective:   Physical Exam  Constitutional: She is oriented to person, place, and time. She appears well-developed and well-nourished.  HENT:  Right Ear: External ear normal.  Left Ear: External ear normal.  Nose: Nose normal.   Mouth/Throat: Oropharynx is clear and moist.  Neck: Normal range of motion. Neck supple.  Cardiovascular: Normal rate, regular rhythm and normal heart sounds.   Pulmonary/Chest: Effort normal and breath sounds normal.  Abdominal: Soft. Bowel sounds are normal.  Musculoskeletal: Normal range of motion.  Neurological: She is alert and oriented to person, place, and time.  Skin:  Skin is warm and dry.       Superficial phlebitis noted to the left lower extremity. Red, minimally warm to touch.  Psychiatric: She has a normal mood and affect.          Assessment & Plan:  Assessment: Superficial phlebitis related to mechanical fall  Plan: Hold Coumadin times one day. Depo-Medrol 40 mg IM x1 given in the office today. Patient to have a repeat INR within the next 2 weeks. Call the office if symptoms worsen or persist. Follow with oncology as scheduled, sooner when necessary. Fall precautions given.

## 2012-02-23 NOTE — Patient Instructions (Signed)
Hold coumadin today only and resume tomorrow at normal dose. Recheck INR in 2-3 weeks   Fall Prevention and Home Safety Falls cause injuries and can affect all age groups. It is possible to use preventive measures to significantly decrease the likelihood of falls. There are many simple measures which can make your home safer and prevent falls. OUTDOORS  Repair cracks and edges of walkways and driveways.  Remove high doorway thresholds.  Trim shrubbery on the main path into your home.  Have good outside lighting.  Clear walkways of tools, rocks, debris, and clutter.  Check that handrails are not broken and are securely fastened. Both sides of steps should have handrails.  Have leaves, snow, and ice cleared regularly.  Use sand or salt on walkways during winter months.  In the garage, clean up grease or oil spills. BATHROOM  Install night lights.  Install grab bars by the toilet and in the tub and shower.  Use non-skid mats or decals in the tub or shower.  Place a plastic non-slip stool in the shower to sit on, if needed.  Keep floors dry and clean up all water on the floor immediately.  Remove soap buildup in the tub or shower on a regular basis.  Secure bath mats with non-slip, double-sided rug tape.  Remove throw rugs and tripping hazards from the floors. BEDROOMS  Install night lights.  Make sure a bedside light is easy to reach.  Do not use oversized bedding.  Keep a telephone by your bedside.  Have a firm chair with side arms to use for getting dressed.  Remove throw rugs and tripping hazards from the floor. KITCHEN  Keep handles on pots and pans turned toward the center of the stove. Use back burners when possible.  Clean up spills quickly and allow time for drying.  Avoid walking on wet floors.  Avoid hot utensils and knives.  Position shelves so they are not too high or low.  Place commonly used objects within easy reach.  If necessary, use a  sturdy step stool with a grab bar when reaching.  Keep electrical cables out of the way.  Do not use floor polish or wax that makes floors slippery. If you must use wax, use non-skid floor wax.  Remove throw rugs and tripping hazards from the floor. STAIRWAYS  Never leave objects on stairs.  Place handrails on both sides of stairways and use them. Fix any loose handrails. Make sure handrails on both sides of the stairways are as long as the stairs.  Check carpeting to make sure it is firmly attached along stairs. Make repairs to worn or loose carpet promptly.  Avoid placing throw rugs at the top or bottom of stairways, or properly secure the rug with carpet tape to prevent slippage. Get rid of throw rugs, if possible.  Have an electrician put in a light switch at the top and bottom of the stairs. OTHER FALL PREVENTION TIPS  Wear low-heel or rubber-soled shoes that are supportive and fit well. Wear closed toe shoes.  When using a stepladder, make sure it is fully opened and both spreaders are firmly locked. Do not climb a closed stepladder.  Add color or contrast paint or tape to grab bars and handrails in your home. Place contrasting color strips on first and last steps.  Learn and use mobility aids as needed. Install an electrical emergency response system.  Turn on lights to avoid dark areas. Replace light bulbs that burn out immediately. Get  light switches that glow.  Arrange furniture to create clear pathways. Keep furniture in the same place.  Firmly attach carpet with non-skid or double-sided tape.  Eliminate uneven floor surfaces.  Select a carpet pattern that does not visually hide the edge of steps.  Be aware of all pets. OTHER HOME SAFETY TIPS  Set the water temperature for 120 F (48.8 C).  Keep emergency numbers on or near the telephone.  Keep smoke detectors on every level of the home and near sleeping areas. Document Released: 02/27/2002 Document Revised:  09/08/2011 Document Reviewed: 05/29/2011 [facility] Patient Information 2013 [place], [place].

## 2012-02-25 ENCOUNTER — Ambulatory Visit (HOSPITAL_BASED_OUTPATIENT_CLINIC_OR_DEPARTMENT_OTHER): Payer: Medicare Other | Admitting: Pharmacist

## 2012-02-25 DIAGNOSIS — I82409 Acute embolism and thrombosis of unspecified deep veins of unspecified lower extremity: Secondary | ICD-10-CM

## 2012-02-25 DIAGNOSIS — I824Z9 Acute embolism and thrombosis of unspecified deep veins of unspecified distal lower extremity: Secondary | ICD-10-CM

## 2012-02-25 NOTE — Progress Notes (Signed)
INR supratherapeutic on Tuesday, 02/23/12 (3.5) as measured in ER Pt went to ER after tripping and falling over her dogs - and developing large deep bruises on lower leg that extend from her ankle to behind her knee. No other bleeding issues.  Held Coumadin x 1 that day, then resumed usual dose of 5mg  daily except 7.5mg  on MWF.  Recheck INR in ~ 1 week on 03/02/12.

## 2012-02-26 ENCOUNTER — Ambulatory Visit: Payer: Medicare Other

## 2012-02-26 ENCOUNTER — Other Ambulatory Visit: Payer: Medicare Other | Admitting: Lab

## 2012-03-01 ENCOUNTER — Ambulatory Visit (INDEPENDENT_AMBULATORY_CARE_PROVIDER_SITE_OTHER): Payer: Medicare Other | Admitting: Family

## 2012-03-01 ENCOUNTER — Encounter: Payer: Self-pay | Admitting: Family

## 2012-03-01 VITALS — BP 114/76 | HR 85 | Temp 98.5°F | Wt 245.0 lb

## 2012-03-01 DIAGNOSIS — R062 Wheezing: Secondary | ICD-10-CM

## 2012-03-01 DIAGNOSIS — I824Z9 Acute embolism and thrombosis of unspecified deep veins of unspecified distal lower extremity: Secondary | ICD-10-CM

## 2012-03-01 DIAGNOSIS — I82409 Acute embolism and thrombosis of unspecified deep veins of unspecified lower extremity: Secondary | ICD-10-CM

## 2012-03-01 DIAGNOSIS — J209 Acute bronchitis, unspecified: Secondary | ICD-10-CM

## 2012-03-01 MED ORDER — METHYLPREDNISOLONE 4 MG PO KIT: PACK | ORAL | Status: AC

## 2012-03-01 MED ORDER — DOXYCYCLINE HYCLATE 100 MG PO TABS: 100.0000 mg | ORAL_TABLET | Freq: Two times a day (BID) | ORAL | Status: DC

## 2012-03-01 NOTE — Patient Instructions (Addendum)
Have INR checked in 1 week.   Bronchitis Bronchitis is the body's way of reacting to injury and/or infection (inflammation) of the bronchi. Bronchi are the air tubes that extend from the windpipe into the lungs. If the inflammation becomes severe, it may cause shortness of breath. CAUSES  Inflammation may be caused by:  A virus.  Germs (bacteria).  Dust.  Allergens.  Pollutants and many other irritants. The cells lining the bronchial tree are covered with tiny hairs (cilia). These constantly beat upward, away from the lungs, toward the mouth. This keeps the lungs free of pollutants. When these cells become too irritated and are unable to do their job, mucus begins to develop. This causes the characteristic cough of bronchitis. The cough clears the lungs when the cilia are unable to do their job. Without either of these protective mechanisms, the mucus would settle in the lungs. Then you would develop pneumonia. Smoking is a common cause of bronchitis and can contribute to pneumonia. Stopping this habit is the single most important thing you can do to help yourself. TREATMENT   Your caregiver may prescribe an antibiotic if the cough is caused by bacteria. Also, medicines that open up your airways make it easier to breathe. Your caregiver may also recommend or prescribe an expectorant. It will loosen the mucus to be coughed up. Only take over-the-counter or prescription medicines for pain, discomfort, or fever as directed by your caregiver.  Removing whatever causes the problem (smoking, for example) is critical to preventing the problem from getting worse.  Cough suppressants may be prescribed for relief of cough symptoms.  Inhaled medicines may be prescribed to help with symptoms now and to help prevent problems from returning.  For those with recurrent (chronic) bronchitis, there may be a need for steroid medicines. SEEK IMMEDIATE MEDICAL CARE IF:   During treatment, you develop more  pus-like mucus (purulent sputum).  You have a fever.  Your baby is older than 3 months with a rectal temperature of 102 F (38.9 C) or higher.  Your baby is 97 months old or younger with a rectal temperature of 100.4 F (38 C) or higher.  You become progressively more ill.  You have increased difficulty breathing, wheezing, or shortness of breath. It is necessary to seek immediate medical care if you are elderly or sick from any other disease. MAKE SURE YOU:   Understand these instructions.  Will watch your condition.  Will get help right away if you are not doing well or get worse. Document Released: 03/09/2005 Document Revised: 06/01/2011 Document Reviewed: 01/17/2008 [employer] Patient Information 2013 [place], [place]. And he is review

## 2012-03-01 NOTE — Progress Notes (Signed)
Subjective:    Patient ID: [name], female    DOB: 02-21-1946, 66 y.o.   MRN: [id number]  HPI 66 year old white female, patient of Dr. Caryl Never, is in today with worsening cough, congestion, wheezing x 6 days. Cough is productive with gray phlegm. Has muscle aches but denies fever.   Review of Systems  Constitutional: Negative.   HENT: Negative.   Respiratory: Positive for cough and wheezing.   Cardiovascular: Negative.   Gastrointestinal: Negative.   Musculoskeletal: Negative.   Skin: Negative.   Neurological: Negative.   Hematological: Negative.   Psychiatric/Behavioral: Negative.    Past Medical History  Diagnosis Date  . DYSLIPIDEMIA 02/27/2009  . DEPRESSION 06/13/2008  . ALLERGIC RHINITIS 08/01/2009  . ABDOMINAL PAIN RIGHT UPPER QUADRANT 11/08/2008  . CARDIOVASCULAR FUNCTION STUDY, ABNORMAL 04/04/2009  . NON-HODGKIN'S LYMPHOMA, HX OF dx  2005--  chemoradiation completed 2006--  no recurrence    onocologist- dr odogwu--   . OSTEOARTHRITIS, MODERATE 04/07/2010  . CAD, NATIVE VESSEL 05/30/2009    cardiologist- dr Shirlee Latch- visit 04-13-2010 in epic-- denies cardiac symptoms  . DYSPNEA 02/27/2009  . Retroperitoneal fibrosis   . Frequency of urination   . History of Lyme disease   . History of Bell's palsy   . DDD (degenerative disc disease), lumbar   . GERD (gastroesophageal reflux disease)   . Anxiety   . 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 History of DVT of lower extremity 2006-- CHRONIC COUMADIN THERAPY  . Anticoagulated on Coumadin     History   Social History  . Marital Status: Married    Spouse Name: N/A    Number of Children: N/A  . Years of Education: N/A   Occupational History  . Not on file.   Social History Main Topics  . Smoking status: Former Smoker -- 2.0 packs/day for 52 years    Types: Cigarettes    Quit date: 03/24/2008  . Smokeless tobacco: Never Used  . Alcohol Use: Yes     Comment: RARE  . Drug  Use: No  . Sexually Active: Not on file   Other Topics Concern  . Not on file   Social History Narrative  . No narrative on file    Past Surgical History  Procedure Date  . Multiple cysto/ left ureteral stent exchanges LAST ONE 10-28-10  . Total knee arthroplasty OCT 2010    RIGHT  . Total knee arthroplasty JAN 2010    LEFT  . Knee arthroscopy 2005    RIGHT  . Breast mass excision     BENIGN-- RIGHT BREAST  . Retroperitoneal bx 2007  . Exploratory laparotomy 2005    LYMPHOMA  . Tonsillectomy CHILD  . Cardiovascular stress test 04-04-2009-- PER PT ASYMPTOMATIC-- MEDICAL MANAGEMENT    STUDY WAS EQUIVOCAL/ EF 49%/ GLOBAL HYPOKINESIS/ APPEARS TO BE A MILD ANTERIOR PERFUSION DEFECT COULD REPRESENT ISCHEMIA BUT DUE TO  ACTIVITY WORSE IN STRESS THAN AT REST POSS. THE DEFECT WAS ARTIFACTUAL  . Transthoracic echocardiogram 05-03-2009    MILD SYSTOLIC DYSFUNCTION/ EF 45-50%/ MODERATE DIASTOLIC DYSFUCTION/ MILD MR/ MILD BIATRIAL ENLARGEMENT  . Ct angiogram FEB 2011    CALCIUM SCORE 161/ POSS. MODERATE MID LAD STENOSIS  . Cystoscopy w/ ureteral stent removal 04/28/2011    Procedure: CYSTOSCOPY WITH STENT REMOVAL;  Surgeon: Antony Haste, MD;  Location: [facility];  Service: Urology;;  . Cystoscopy w/ retrogrades 04/28/2011    Procedure: CYSTOSCOPY WITH RETROGRADE PYELOGRAM;  Surgeon: Antony Haste,  MD;  Location: [facility];  Service: Urology;  Laterality: Left;    Family History  Problem Relation Age of Onset  . Arthritis Mother   . Heart disease Mother   . Diabetes Sister   . Hypertension Sister   . Stroke Sister     Allergies  Allergen Reactions  . Chlorhexidine Base Itching    CHG WIPES  . Penicillins Hives    Current Outpatient Prescriptions on File Prior to Visit  Medication Sig Dispense Refill  . ALPRAZolam (XANAX) 1 MG tablet Take 1 tablet (1 mg total) by mouth 2 (two) times daily.  60 tablet  5  . buPROPion  (WELLBUTRIN XL) 300 MG 24 hr tablet Take 1 tablet (300 mg total) by mouth daily.  90 tablet  3  . chlorhexidine (PERIDEX) 0.12 % solution 5 mLs by Mouth Rinse route Twice daily.      . cholecalciferol (VITAMIN D) 400 UNITS TABS Take 400 Units by mouth daily.       . citalopram (CELEXA) 40 MG tablet Take 1 tablet (40 mg total) by mouth daily.  90 tablet  3  . co-enzyme Q-10 30 MG capsule Take 30 mg by mouth daily.       . fluticasone (FLONASE) 50 MCG/ACT nasal spray instill 2 sprays into each nostril once daily  16 g  6  . lisinopril (PRINIVIL,ZESTRIL) 5 MG tablet take 1 tablet by mouth once daily  90 tablet  1  . loratadine (CLARITIN) 10 MG tablet Take 10 mg by mouth daily.       Marland Kitchen NITROSTAT 0.4 MG SL tablet PLACE 1 TAB UNDER TONGUE.Marland Kitchen TAKE TABS 5 MINS APART.. UP TO 3 TABS  25 each  12  . omeprazole (PRILOSEC) 20 MG capsule Take 20 mg by mouth every morning.      Marland Kitchen oxyCODONE (OXY IR/ROXICODONE) 5 MG immediate release tablet Take 1 tablet (5 mg total) by mouth every 4 (four) hours as needed for pain.  30 tablet  0  . Pitavastatin Calcium 2 MG TABS Take 1 tablet (2 mg total) by mouth daily.  30 tablet  11  . warfarin (COUMADIN) 2.5 MG tablet Take 5 mg daily except 7.5 mg on MWF or as directed  72 tablet  3  . warfarin (COUMADIN) 5 MG tablet Take 5 mg daily except 7.5 mg on MWF or as directed  36 tablet  3    BP 114/76  Pulse 85  Temp 98.5 F (36.9 C) (Oral)  Wt 245 lb (111.131 kg)  SpO2 93%chart    Objective:   Physical Exam  Constitutional: She is oriented to person, place, and time. She appears well-developed and well-nourished.  HENT:  Right Ear: External ear normal.  Left Ear: External ear normal.  Nose: Nose normal.  Mouth/Throat: Oropharynx is clear and moist.  Neck: Normal range of motion. Neck supple.  Cardiovascular: Normal rate, regular rhythm and normal heart sounds.   Pulmonary/Chest: Effort normal. She has wheezes.       Chest congestion that clears with coughing. Wheezing  bilaterally.   Neurological: She is alert and oriented to person, place, and time.  Skin: Skin is warm and dry.  Psychiatric: She has a normal mood and affect.          Assessment & Plan:  Assessment: Bronchitis, Wheezing, Cough  Plan: Doxycycline 100mg  twice a day x 10 days. Medrol dospak as directed. Call the office if symptoms worsen or persist. Recheck with Coumadin Clinic  7-10 days.

## 2012-03-02 ENCOUNTER — Other Ambulatory Visit: Payer: Medicare Other | Admitting: Lab

## 2012-03-02 ENCOUNTER — Ambulatory Visit: Payer: Medicare Other

## 2012-03-02 ENCOUNTER — Telehealth: Payer: Self-pay | Admitting: Pharmacist

## 2012-03-07 NOTE — Telephone Encounter (Signed)
Pt went to her PCP today (03/01/12) for wheezing and cough. INR = 2.1 Pt called to say that she started 2 new medications on 03/01/12; Medrol DosePack and Doxycycline 100mg  po BID x 10days for bronchitis. PCP recommended to have INR rechecked in 1 week since these medication can increase her INR. Will cancel lab/coumadin clinic appointment for 03/02/12. Will reschedule for 03/09/12; Lab at 2pm and coumadin clinic at 2:15pm. Pt in agreement with plan.

## 2012-03-09 ENCOUNTER — Other Ambulatory Visit (HOSPITAL_BASED_OUTPATIENT_CLINIC_OR_DEPARTMENT_OTHER): Payer: Medicare Other | Admitting: Lab

## 2012-03-09 ENCOUNTER — Ambulatory Visit (HOSPITAL_BASED_OUTPATIENT_CLINIC_OR_DEPARTMENT_OTHER): Payer: Medicare Other | Admitting: Pharmacist

## 2012-03-09 DIAGNOSIS — I82409 Acute embolism and thrombosis of unspecified deep veins of unspecified lower extremity: Secondary | ICD-10-CM

## 2012-03-09 DIAGNOSIS — I824Z9 Acute embolism and thrombosis of unspecified deep veins of unspecified distal lower extremity: Secondary | ICD-10-CM

## 2012-03-09 LAB — PROTIME-INR
INR: 3.9 — ABNORMAL HIGH (ref 2.00–3.50)
Protime: 46.8 Seconds — ABNORMAL HIGH (ref 10.6–13.4)

## 2012-03-09 NOTE — Progress Notes (Signed)
INR supratherapeutic today (3.9) on 5mg  daily except 7.5mg  on MWF.   Pt just recently finished steroid burst/taper and is in the middle of a course of doxycycline for bronchitis.  She has three days left of antibiotics.  Both these drugs could increase her INR. She has not had any new bruising.  The extensive bruising from her fall a couple weeks ago is still in the process of healing.  She states she can still feel these knots under her skin.  Will have pt hold dose x 1, then resume usual dose.  Recheck INR in ~10 days.

## 2012-03-12 ENCOUNTER — Telehealth: Payer: Self-pay | Admitting: Oncology

## 2012-03-12 NOTE — Telephone Encounter (Signed)
Pt called and gave her appt with Dr. Arline Asp and Annice Pih, reschedule from Dr.Odogwu on 03/30/12, pt is aware of all appts

## 2012-03-12 NOTE — Telephone Encounter (Signed)
Called pt and left message with husband to call us back once patient is awake,regarding a rescheduling appt with Dr. Dalene Carrow, waiting for a call back

## 2012-03-21 ENCOUNTER — Other Ambulatory Visit: Payer: Medicare Other | Admitting: Lab

## 2012-03-21 ENCOUNTER — Ambulatory Visit: Payer: Medicare Other

## 2012-03-25 ENCOUNTER — Ambulatory Visit (HOSPITAL_BASED_OUTPATIENT_CLINIC_OR_DEPARTMENT_OTHER): Payer: Medicare Other | Admitting: Pharmacist

## 2012-03-25 ENCOUNTER — Other Ambulatory Visit: Payer: Self-pay | Admitting: Family

## 2012-03-25 ENCOUNTER — Other Ambulatory Visit (HOSPITAL_BASED_OUTPATIENT_CLINIC_OR_DEPARTMENT_OTHER): Payer: Medicare Other | Admitting: Lab

## 2012-03-25 DIAGNOSIS — I824Z9 Acute embolism and thrombosis of unspecified deep veins of unspecified distal lower extremity: Secondary | ICD-10-CM

## 2012-03-25 DIAGNOSIS — Z87898 Personal history of other specified conditions: Secondary | ICD-10-CM

## 2012-03-25 DIAGNOSIS — I82409 Acute embolism and thrombosis of unspecified deep veins of unspecified lower extremity: Secondary | ICD-10-CM

## 2012-03-25 DIAGNOSIS — Z7901 Long term (current) use of anticoagulants: Secondary | ICD-10-CM

## 2012-03-25 DIAGNOSIS — Z5181 Encounter for therapeutic drug level monitoring: Secondary | ICD-10-CM

## 2012-03-25 LAB — COMPREHENSIVE METABOLIC PANEL (CC13)
ALT: 17 U/L (ref 0–55)
Albumin: 3.5 g/dL (ref 3.5–5.0)
CO2: 24 mEq/L (ref 22–29)
Calcium: 9.5 mg/dL (ref 8.4–10.4)
Chloride: 110 mEq/L — ABNORMAL HIGH (ref 98–107)
Creatinine: 1.2 mg/dL — ABNORMAL HIGH (ref 0.6–1.1)
Potassium: 4.1 mEq/L (ref 3.5–5.1)
Total Protein: 7 g/dL (ref 6.4–8.3)

## 2012-03-25 LAB — CBC WITH DIFFERENTIAL/PLATELET
BASO%: 0.3 % (ref 0.0–2.0)
Basophils Absolute: 0 10*3/uL (ref 0.0–0.1)
HCT: 37.2 % (ref 34.8–46.6)
HGB: 12.7 g/dL (ref 11.6–15.9)
MCHC: 34.1 g/dL (ref 31.5–36.0)
MONO#: 0.5 10*3/uL (ref 0.1–0.9)
NEUT%: 67.3 % (ref 38.4–76.8)
WBC: 5.9 10*3/uL (ref 3.9–10.3)
lymph#: 1.3 10*3/uL (ref 0.9–3.3)

## 2012-03-25 LAB — PROTIME-INR

## 2012-03-25 LAB — LACTATE DEHYDROGENASE (CC13): LDH: 196 U/L (ref 125–245)

## 2012-03-25 NOTE — Patient Instructions (Signed)
Take 10 mg dose today, then continue current dosage. Recheck INR in ~10 days on 04/08/12 at 1415 for lab, 1430 for clinic.

## 2012-03-25 NOTE — Progress Notes (Signed)
Pt dropped to 1.5 with the only change being a "shot of toradol or steroid" at her hip MD last week.  Asked her to take 10 mg dose today, then continue current dosage. Recheck INR in ~10 days on 04/08/12 at 1415 for lab, 1430 for clinic.

## 2012-03-26 ENCOUNTER — Encounter: Payer: Self-pay | Admitting: Oncology

## 2012-03-28 ENCOUNTER — Other Ambulatory Visit: Payer: Medicare Other | Admitting: Lab

## 2012-03-28 ENCOUNTER — Encounter (HOSPITAL_COMMUNITY)
Admission: RE | Admit: 2012-03-28 | Discharge: 2012-03-28 | Disposition: A | Discharge status: Home / Self Care | Payer: Medicare Other | Source: Ambulatory Visit | Attending: Hematology and Oncology | Admitting: Hematology and Oncology

## 2012-03-28 DIAGNOSIS — Z87898 Personal history of other specified conditions: Secondary | ICD-10-CM | POA: Insufficient documentation

## 2012-03-28 LAB — GLUCOSE, CAPILLARY: Glucose-Capillary: 97 mg/dL (ref 70–99)

## 2012-03-28 MED ORDER — FLUDEOXYGLUCOSE F - 18 (FDG) INJECTION
19.2000 | Freq: Once | INTRAVENOUS | Status: AC | PRN
  Administered 2012-03-28: 19.2 via INTRAVENOUS

## 2012-03-30 ENCOUNTER — Telehealth: Payer: Self-pay | Admitting: Oncology

## 2012-03-30 ENCOUNTER — Ambulatory Visit: Payer: Medicare Other | Admitting: Hematology and Oncology

## 2012-03-30 ENCOUNTER — Other Ambulatory Visit (HOSPITAL_BASED_OUTPATIENT_CLINIC_OR_DEPARTMENT_OTHER): Payer: Medicare Other | Admitting: Lab

## 2012-03-30 ENCOUNTER — Encounter: Payer: Self-pay | Admitting: Family

## 2012-03-30 ENCOUNTER — Ambulatory Visit (HOSPITAL_BASED_OUTPATIENT_CLINIC_OR_DEPARTMENT_OTHER): Payer: Medicare Other | Admitting: Family

## 2012-03-30 VITALS — BP 119/68 | HR 70 | Temp 98.5°F | Resp 20 | Ht 67.0 in | Wt 235.2 lb

## 2012-03-30 DIAGNOSIS — Z87898 Personal history of other specified conditions: Secondary | ICD-10-CM

## 2012-03-30 NOTE — Progress Notes (Signed)
Patient ID: [name], female   DOB: 01-12-1946, 67 y.o.   MRN: [id number] CSN: [id number]  CC: Angela Peat, MD Angela Ancona, MD  Problem List: [name] is a 67 y.o. Caucasian female with a problem list consisting of:  1.  Large B-cell non-Hodgkin's lymphoma diagnosed 02/2004, stage II  involving the retroperitoneal nodes complicated by retroperitoneal fibrosis and left-sided hydronephrosis.  Status post 8 cycles of chemotherapy with Rituxan, Cyclophosphamide, Hydroxydaunorubicin, Oncovin and Prednisone chemotherapy completing treatment on 09/04/2004.  Status post consolidated radiation to retroperitoneum and pelvis following chemotherapy.   2.  Left ureteral stricture with original ureteral double J stent insertion on 10/25/2003 by Dr. Javier Glazier. The patient then underwent several subsequent ureteral stent exchanges every 3-6 months.  Ureteral stent removal by Dr. Jerilee Field on 04/28/2011. 3.  Recurrent DVT of the left lower extremity first diagnosed 02/2004.  She is currently on Coumadin therapy that is managed by the [facility].   4.  Dyslipidemia 5.  Depression 6.  PET scan on 03/28/2012 showed no evidence of recurrent lymphoma.  Dr. Arline Asp and I saw [name] today as a new patient transferring from Dr. Dalene Carrow.  [name] was last seen by Dr. Dalene Carrow on 04/01/2011. Since her last office visit the patient reports that she feels well and denies any hospitalization or ER visits.  [name] states that her ureteral stent was removed in 04/2011 and she has not had any urinary symptoms or difficulties.  She has a history of recurrent DVT in her LLE that is currently managed by our [facility].  [name] believes her last colonoscopy was approximately 2 years ago and she cannot remember the last time she had a mammogram.  [name] had a PET scan on 03/28/2012 which did not show any evidence of recurrent lymphoma.  The results of the PET  scan were discussed with her and she was provided a copy of the PET scan.  Dr. Arline Asp explained to her that since she is over 8 years out from her original diagnosis and she has a type of lymphoma that has a very low recurrence rate, we will cease the yearly PET scans.  [name] was encouraged to resume receiving annual mammograms.  The patient denies any symptomatology and states that she has been doing well.    Past Medical History: Past Medical History  Diagnosis Date  . DYSLIPIDEMIA 02/27/2009  . DEPRESSION 06/13/2008  . ALLERGIC RHINITIS 08/01/2009  . ABDOMINAL PAIN RIGHT UPPER QUADRANT 11/08/2008  . CARDIOVASCULAR FUNCTION STUDY, ABNORMAL 04/04/2009  . NON-HODGKIN'S LYMPHOMA, HX OF dx  2005--  chemoradiation completed 2006--  no recurrence    onocologist- dr odogwu--   . OSTEOARTHRITIS, MODERATE 04/07/2010  . CAD, NATIVE VESSEL 05/30/2009    cardiologist- dr Shirlee Latch- visit 04-13-2010 in epic-- denies cardiac symptoms  . DYSPNEA 02/27/2009  . Retroperitoneal fibrosis   . Frequency of urination   . History of Lyme disease   . History of Bell's palsy   . DDD (degenerative disc disease), lumbar   . GERD (gastroesophageal reflux disease)   . Anxiety   . 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 History of DVT of lower extremity 2006-- CHRONIC COUMADIN THERAPY  . Anticoagulated on Coumadin     Surgical History: Past Surgical History  Procedure Date  . Multiple cysto/ left ureteral stent exchanges LAST ONE 10-28-10  . Total knee arthroplasty OCT 2010    RIGHT  . Total knee arthroplasty JAN  2010    LEFT  . Knee arthroscopy 2005    RIGHT  . Breast mass excision     BENIGN-- RIGHT BREAST  . Retroperitoneal bx 2007  . Exploratory laparotomy 2005    LYMPHOMA  . Tonsillectomy CHILD  . Cardiovascular stress test 04-04-2009-- PER PT ASYMPTOMATIC-- MEDICAL MANAGEMENT    STUDY WAS EQUIVOCAL/ EF 49%/ GLOBAL HYPOKINESIS/ APPEARS TO BE A MILD ANTERIOR  PERFUSION DEFECT COULD REPRESENT ISCHEMIA BUT DUE TO  ACTIVITY WORSE IN STRESS THAN AT REST POSS. THE DEFECT WAS ARTIFACTUAL  . Transthoracic echocardiogram 05-03-2009    MILD SYSTOLIC DYSFUNCTION/ EF 45-50%/ MODERATE DIASTOLIC DYSFUCTION/ MILD MR/ MILD BIATRIAL ENLARGEMENT  . Ct angiogram FEB 2011    CALCIUM SCORE 161/ POSS. MODERATE MID LAD STENOSIS  . Cystoscopy w/ ureteral stent removal 04/28/2011    Procedure: CYSTOSCOPY WITH STENT REMOVAL;  Surgeon: Antony Haste, MD;  Location: [facility];  Service: Urology;;  . Cystoscopy w/ retrogrades 04/28/2011    Procedure: CYSTOSCOPY WITH RETROGRADE PYELOGRAM;  Surgeon: Antony Haste, MD;  Location: [facility];  Service: Urology;  Laterality: Left;    Current Medications: Current Outpatient Prescriptions  Medication Sig Dispense Refill  . ALPRAZolam (XANAX) 1 MG tablet Take 1 tablet (1 mg total) by mouth 2 (two) times daily.  60 tablet  5  . buPROPion (WELLBUTRIN XL) 300 MG 24 hr tablet Take 1 tablet (300 mg total) by mouth daily.  90 tablet  3  . chlorhexidine (PERIDEX) 0.12 % solution 5 mLs by Mouth Rinse route Twice daily.      . cholecalciferol (VITAMIN D) 400 UNITS TABS Take 400 Units by mouth daily.       . citalopram (CELEXA) 40 MG tablet Take 1 tablet (40 mg total) by mouth daily.  90 tablet  3  . co-enzyme Q-10 30 MG capsule Take 30 mg by mouth daily.       Marland Kitchen doxycycline (VIBRA-TABS) 100 MG tablet Take 1 tablet (100 mg total) by mouth 2 (two) times daily.  20 tablet  0  . fluticasone (FLONASE) 50 MCG/ACT nasal spray instill 2 sprays into each nostril once daily  16 g  6  . lisinopril (PRINIVIL,ZESTRIL) 5 MG tablet take 1 tablet by mouth once daily  90 tablet  1  . loratadine (CLARITIN) 10 MG tablet Take 10 mg by mouth daily.       Marland Kitchen NITROSTAT 0.4 MG SL tablet PLACE 1 TAB UNDER TONGUE.Marland Kitchen TAKE TABS 5 MINS APART.. UP TO 3 TABS  25 each  12  . omeprazole (PRILOSEC) 20 MG capsule Take 20 mg by  mouth every morning.      Marland Kitchen oxyCODONE (OXY IR/ROXICODONE) 5 MG immediate release tablet Take 1 tablet (5 mg total) by mouth every 4 (four) hours as needed for pain.  30 tablet  0  . Pitavastatin Calcium 2 MG TABS Take 1 tablet (2 mg total) by mouth daily.  30 tablet  11  . warfarin (COUMADIN) 2.5 MG tablet Take 5 mg daily except 7.5 mg on MWF or as directed  72 tablet  3  . warfarin (COUMADIN) 5 MG tablet Take 5 mg daily except 7.5 mg on MWF or as directed  36 tablet  3    Allergies: Allergies  Allergen Reactions  . Chlorhexidine Base Itching    CHG WIPES  . Penicillins Hives    Family History: Family History  Problem Relation Age of Onset  . Arthritis Mother   .  Heart disease Mother   . Diabetes Sister   . Hypertension Sister   . Stroke Sister     Social History: History  Substance Use Topics  . Smoking status: Former Smoker -- 2.0 packs/day for 52 years    Types: Cigarettes    Quit date: 03/24/2008  . Smokeless tobacco: Never Used  . Alcohol Use: Yes     Comment: RARE    Review of Systems: 10 Point review of systems was completed and is negative.   Physical Exam:   Blood pressure 119/68, pulse 70, temperature 98.5 F (36.9 C), temperature source Oral, resp. rate 20, height 5\' 7"  (1.702 m), weight 235 lb 3.2 oz (106.686 kg).  General appearance: Alert, cooperative, well nourished, obese, no apparent distress  Head: Normocephalic, without obvious abnormality, atraumatic Eyes: Conjunctivae/corneas clear, PERRLA, EOMI Nose: Nares, septum and mucosa are normal, no drainage or sinus tenderness Neck: No adenopathy, supple, symmetrical, trachea midline, thyroid not enlarged, no tenderness Resp: Clear to auscultation bilaterally Cardio: Regular rate and rhythm, S1, S2 normal, no murmur, click, rub or gallop GI: Soft, distended, excessive habitus, non-tender, normoactive bowel sounds, no organomegaly Extremities: Extremities normal, atraumatic, no cyanosis, bilateral lower  extremity +2 edema, no erythema/pain/warmth noted Skin: Bilateral lower extremity veriscosities Lymph nodes: Cervical, supraclavicular, and axillary nodes normal Neurologic: Grossly normal   Laboratory Data: Recent Results (from the past 168 hour(s))  POCT INR      Component Value Range   INR 1.5    PROTIME-INR   Collection Time   03/25/12  1:59 PM      Component Value Range   Protime 18.0 (*) 10.6 - 13.4 Seconds   INR 1.50 (*) 2.00 - 3.50   Lovenox No    CBC WITH DIFFERENTIAL   Collection Time   03/25/12  2:15 PM      Component Value Range   WBC 5.9  3.9 - 10.3 10e3/uL   NEUT# 4.0  1.5 - 6.5 10e3/uL   HGB 12.7  11.6 - 15.9 g/dL   HCT 16.1  09.6 - 04.5 %   Platelets 192  145 - 400 10e3/uL   MCV 89.8  79.5 - 101.0 fL   MCH 30.6  25.1 - 34.0 pg   MCHC 34.1  31.5 - 36.0 g/dL   RBC 4.09  8.11 - 9.14 10e6/uL   RDW 15.6 (*) 11.2 - 14.5 %   lymph# 1.3  0.9 - 3.3 10e3/uL   MONO# 0.5  0.1 - 0.9 10e3/uL   Eosinophils Absolute 0.0  0.0 - 0.5 10e3/uL   Basophils Absolute 0.0  0.0 - 0.1 10e3/uL   NEUT% 67.3  38.4 - 76.8 %   LYMPH% 22.9  14.0 - 49.7 %   MONO% 8.8  0.0 - 14.0 %   EOS% 0.7  0.0 - 7.0 %   BASO% 0.3  0.0 - 2.0 %  COMPREHENSIVE METABOLIC PANEL (CC13)   Collection Time   03/25/12  2:15 PM      Component Value Range   Sodium 142  136 - 145 mEq/L   Potassium 4.1  3.5 - 5.1 mEq/L   Chloride 110 (*) 98 - 107 mEq/L   CO2 24  22 - 29 mEq/L   Glucose 109 (*) 70 - 99 mg/dl   BUN 78.2  7.0 - 95.6 mg/dL   Creatinine 1.2 (*) 0.6 - 1.1 mg/dL   Total Bilirubin 2.13  0.20 - 1.20 mg/dL   Alkaline Phosphatase 72  40 - 150 U/L  AST 18  5 - 34 U/L   ALT 17  0 - 55 U/L   Total Protein 7.0  6.4 - 8.3 g/dL   Albumin 3.5  3.5 - 5.0 g/dL   Calcium 9.5  8.4 - 78.2 mg/dL  LACTATE DEHYDROGENASE (CC13)   Collection Time   03/25/12  2:15 PM      Component Value Range   LDH 196  125 - 245 U/L  GLUCOSE, CAPILLARY   Collection Time   03/28/12  1:14 PM      Component Value Range    Glucose-Capillary 97  70 - 99 mg/dL     Imaging Studies: 1.  CT chest with contrast on 12/25/2008 showed there are no worrisome lytic or sclerotic lesions. 2.  Ct of the abdomen and pelvis on 12/25/2008 showed review of the visualized osseous structures is significant for mild  spondylosis. The right kidney appears normal. There is a left-sided ureteral stent in place. Left renal atrophy and renal cysts again noted. The appearance is unchanged from previous exam.  Negative for mass or adenopathy. 3.  PET scan on 03/28/2012 showed no evidence of recurrent lymphoma.   Neck: No hypermetabolic lymph nodes in the neck.  CT images show no acute findings.  No pathologically enlarged lymph nodes.  Chest:  No hypermetabolic activity in the chest.  CT images show coronary artery calcification.  No pericardial or pleural effusion.  Abdomen/Pelvis:  No abnormal hypermetabolism in the abdomen or pelvis.  CT images show no acute findings.  Left renal atrophy and cortical scarring.  Atherosclerotic calcification of the arterial vasculature without abdominal aortic aneurysm.  Skeleton:  No focal hypermetabolic activity to suggest skeletal metastasis. There are degenerative changes in the spine.   Impression/Plan: [name] appears to be doing quite well.  Her current exam, laboratory results and PET scan indicate no evidence of recurrence. Dr. Arline Asp has decided that annual PET scans are not necessary unless the patient has a change and/or becomes symptomatic.  We may consider other imaging studies in the future.  [name] will continue to have her history of left deep vein thrombosis managed by the [facility] and she continues on chronic Coumadin therapy.    We plan to see [name] again in a year's time on 03/31/2013 at which time we will check a CBC, CMP and LDH.  We encouraged the patient to contact us if she has any questions or concerns.  [name] was encouraged to get a mammogram  soon.  Dr. Arline Asp and I spent more than half the time coordinating care, extensive review of her records and meeting with the patient.    Larina Bras, NP-C 03/30/2012, 2:18 PM

## 2012-03-30 NOTE — Telephone Encounter (Signed)
appts made and printed for pt aom °

## 2012-03-30 NOTE — Patient Instructions (Addendum)
Please contact us at [phone] if you have any questions or concerns.   Dr. Arline Asp or Norina Buzzard, Nurse Practitioner Please get a mammogram soon.

## 2012-04-08 ENCOUNTER — Ambulatory Visit: Payer: Medicare Other

## 2012-04-08 ENCOUNTER — Other Ambulatory Visit: Payer: Medicare Other | Admitting: Lab

## 2012-04-12 ENCOUNTER — Other Ambulatory Visit (HOSPITAL_BASED_OUTPATIENT_CLINIC_OR_DEPARTMENT_OTHER): Payer: Medicare Other | Admitting: Lab

## 2012-04-12 ENCOUNTER — Ambulatory Visit: Payer: Medicare Other | Admitting: Pharmacist

## 2012-04-12 DIAGNOSIS — I82409 Acute embolism and thrombosis of unspecified deep veins of unspecified lower extremity: Secondary | ICD-10-CM

## 2012-04-12 DIAGNOSIS — I824Z9 Acute embolism and thrombosis of unspecified deep veins of unspecified distal lower extremity: Secondary | ICD-10-CM

## 2012-04-12 LAB — PROTIME-INR: Protime: 25.2 Seconds — ABNORMAL HIGH (ref 10.6–13.4)

## 2012-04-12 NOTE — Progress Notes (Addendum)
INR = 2.1 on 5 mg/day; 7.5 mg MWF Pt left without being seen today; did not want to wait. I left voicemail on pts cell phone to cont same dose.  Asked her to call if questions/concerns/deviations. Plan to repeat INR in 1 month. Marily Lente, Pharm.D.  Addendum: I s/w pt over phone today to make sure she got my phone message yesterday.  She did.  She is doing well.   Her son had an appt that she had to get to so that is why she left w/o being seen in [facility]. Pt has appt 04/20/12 w/ ortho.  Possibility she may get a cortisone injection.  If she does, she will call us to have INR checked the week after shot since her INR dropped after last injection to hip. Marily Lente, Pharm.D.

## 2012-05-06 ENCOUNTER — Telehealth: Payer: Self-pay | Admitting: Pharmacist

## 2012-05-06 NOTE — Telephone Encounter (Addendum)
Pt called to let us know that she thinks she pulled a muscle. She is in a lot of pain. Her prescription for oxycodone (which she uses very infrequently) has expired and she is unable to make an appointment to see the MD with the snow/ice to get another Rx. She doesn't take Tylenol for any reason because her son nearly went into liver failure from Tylenol. She wanted to know 2 things: 1) Can she take Ibuprofen 2) Should she reduce her coumadin dose if she takes a few doses of Ibuprofen over the next few days?   We discussed that she could take a few doses of Ibuprofen to see if it helps alleviate some of the pain. I suggested not to decrease her coumadin dose at this time. Her last few INR's have been subtherapeutic or low therapeutic. We will evaulate her INR in 4 days (05/10/12) and adjust the dose as indicated at that time. Pt plans to make an appointment to get her oxycodone refilled early next week. We discussed the importance of taking ibuprofen with food and to monitor for excessive bruising or bleeding.

## 2012-05-10 ENCOUNTER — Other Ambulatory Visit (HOSPITAL_BASED_OUTPATIENT_CLINIC_OR_DEPARTMENT_OTHER): Payer: Medicare Other | Admitting: Lab

## 2012-05-10 ENCOUNTER — Ambulatory Visit (HOSPITAL_BASED_OUTPATIENT_CLINIC_OR_DEPARTMENT_OTHER): Payer: Medicare Other | Admitting: Pharmacist

## 2012-05-10 DIAGNOSIS — I82409 Acute embolism and thrombosis of unspecified deep veins of unspecified lower extremity: Secondary | ICD-10-CM

## 2012-05-10 DIAGNOSIS — Z87898 Personal history of other specified conditions: Secondary | ICD-10-CM

## 2012-05-10 LAB — COMPREHENSIVE METABOLIC PANEL (CC13)
Albumin: 3.4 g/dL — ABNORMAL LOW (ref 3.5–5.0)
Alkaline Phosphatase: 71 U/L (ref 40–150)
BUN: 17.2 mg/dL (ref 7.0–26.0)
CO2: 25 mEq/L (ref 22–29)
Glucose: 100 mg/dl — ABNORMAL HIGH (ref 70–99)
Total Bilirubin: 0.38 mg/dL (ref 0.20–1.20)
Total Protein: 6.8 g/dL (ref 6.4–8.3)

## 2012-05-10 LAB — CBC WITH DIFFERENTIAL/PLATELET
Basophils Absolute: 0 10*3/uL (ref 0.0–0.1)
Eosinophils Absolute: 0.1 10*3/uL (ref 0.0–0.5)
HCT: 35.1 % (ref 34.8–46.6)
HGB: 11.8 g/dL (ref 11.6–15.9)
LYMPH%: 31.6 % (ref 14.0–49.7)
MCV: 89.8 fL (ref 79.5–101.0)
MONO#: 0.5 10*3/uL (ref 0.1–0.9)
MONO%: 9.1 % (ref 0.0–14.0)
NEUT#: 2.9 10*3/uL (ref 1.5–6.5)
Platelets: 177 10*3/uL (ref 145–400)
WBC: 5 10*3/uL (ref 3.9–10.3)

## 2012-05-10 LAB — LACTATE DEHYDROGENASE (CC13): LDH: 194 U/L (ref 125–245)

## 2012-05-10 LAB — PROTIME-INR
INR: 2.3 (ref 2.00–3.50)
Protime: 27.6 Seconds — ABNORMAL HIGH (ref 10.6–13.4)

## 2012-05-10 LAB — POCT INR: INR: 2.3

## 2012-05-10 NOTE — Progress Notes (Signed)
INR = 2.3 on Coumadin 5 mg daily; 7.5 mg MWF Pt had L neck/shoulder pain end of last week.  She took Ibuprofen 400 mg BID x 2 days.  The pain is gone now. INR therapeutic.  No change to dose of Coumadin. Return in 4 weeks. Marily Lente, Pharm.D.

## 2012-05-19 ENCOUNTER — Telehealth: Payer: Self-pay | Admitting: Family Medicine

## 2012-05-19 NOTE — Telephone Encounter (Signed)
[name]

## 2012-05-22 ENCOUNTER — Emergency Department
Admission: EM | Admit: 2012-05-22 | Discharge: 2012-05-22 | Disposition: A | Discharge status: Home / Self Care | Payer: Medicare Other | Source: Home / Self Care | Attending: Family Medicine | Admitting: Family Medicine

## 2012-05-22 ENCOUNTER — Encounter: Payer: Self-pay | Admitting: Emergency Medicine

## 2012-05-22 DIAGNOSIS — L02419 Cutaneous abscess of limb, unspecified: Secondary | ICD-10-CM

## 2012-05-22 DIAGNOSIS — L03116 Cellulitis of left lower limb: Secondary | ICD-10-CM

## 2012-05-22 DIAGNOSIS — Z23 Encounter for immunization: Secondary | ICD-10-CM

## 2012-05-22 MED ORDER — TETANUS-DIPHTH-ACELL PERTUSSIS 5-2.5-18.5 LF-MCG/0.5 IM SUSP
0.5000 mL | Freq: Once | INTRAMUSCULAR | Status: AC
  Administered 2012-05-22: 0.5 mL via INTRAMUSCULAR

## 2012-05-22 MED ORDER — TETANUS-DIPHTHERIA TOXOIDS TD 5-2 LFU IM INJ: 0.5000 mL | INJECTION | Freq: Once | INTRAMUSCULAR | Status: DC

## 2012-05-22 MED ORDER — DOXYCYCLINE HYCLATE 100 MG PO CAPS: 100.0000 mg | ORAL_CAPSULE | Freq: Two times a day (BID) | ORAL | Status: DC

## 2012-05-22 NOTE — ED Notes (Signed)
Patient reports sudden appearance of spot on lower left leg 4 days ago which is adjacent to a bite from her pet dog (all immunizations current). It has become progressively redder, tender to touch, and she now can feel awareness of area when she walks (not exactly pain). She did have Doxycycline at home which she started taking 3 days ago. No actual insect seen; no awareness of other injury; no knowledge of DPT in past 10 years.

## 2012-05-22 NOTE — ED Provider Notes (Signed)
History     CSN: [id number]  Arrival date & time 05/22/12  1408   First MD Initiated Contact with Patient 05/22/12 1437      Chief Complaint  Patient presents with  . Insect Bite       HPI Comments: Patient reports that about four days ago her dog scratched her left lower leg pre-tibial area.  Subsequently the area has become red and slightly tender to touch.  No swelling.  No drainage from the area.  No calf tenderness.  She does not remember her last Tdap.  Patient is a 67 y.o. female presenting with rash. The history is provided by the patient.  Rash Chronicity:  New Context: animal contact   Relieved by:  Nothing Associated symptoms: no abdominal pain, no diarrhea, no fatigue and no fever     Past Medical History  Diagnosis Date  . DYSLIPIDEMIA 02/27/2009  . DEPRESSION 06/13/2008  . ALLERGIC RHINITIS 08/01/2009  . ABDOMINAL PAIN RIGHT UPPER QUADRANT 11/08/2008  . CARDIOVASCULAR FUNCTION STUDY, ABNORMAL 04/04/2009  . NON-HODGKIN'S LYMPHOMA, HX OF dx  2005--  chemoradiation completed 2006--  no recurrence    onocologist- dr odogwu--   . OSTEOARTHRITIS, MODERATE 04/07/2010  . CAD, NATIVE VESSEL 05/30/2009    cardiologist- dr Shirlee Latch- visit 04-13-2010 in epic-- denies cardiac symptoms  . DYSPNEA 02/27/2009  . Retroperitoneal fibrosis   . Frequency of urination   . History of Lyme disease   . History of Bell's palsy   . DDD (degenerative disc disease), lumbar   . GERD (gastroesophageal reflux disease)   . Anxiety   . 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 History of DVT of lower extremity 2006-- CHRONIC COUMADIN THERAPY  . Anticoagulated on Coumadin     Past Surgical History  Procedure Laterality Date  . Multiple cysto/ left ureteral stent exchanges  LAST ONE 10-28-10  . Total knee arthroplasty  OCT 2010    RIGHT  . Total knee arthroplasty  JAN 2010    LEFT  . Knee arthroscopy  2005    RIGHT  . Breast mass excision      BENIGN-- RIGHT  BREAST  . Retroperitoneal bx  2007  . Exploratory laparotomy  2005    LYMPHOMA  . Tonsillectomy  CHILD  . Cardiovascular stress test  04-04-2009-- PER PT ASYMPTOMATIC-- MEDICAL MANAGEMENT    STUDY WAS EQUIVOCAL/ EF 49%/ GLOBAL HYPOKINESIS/ APPEARS TO BE A MILD ANTERIOR PERFUSION DEFECT COULD REPRESENT ISCHEMIA BUT DUE TO  ACTIVITY WORSE IN STRESS THAN AT REST POSS. THE DEFECT WAS ARTIFACTUAL  . Transthoracic echocardiogram  05-03-2009    MILD SYSTOLIC DYSFUNCTION/ EF 45-50%/ MODERATE DIASTOLIC DYSFUCTION/ MILD MR/ MILD BIATRIAL ENLARGEMENT  . Ct angiogram  FEB 2011    CALCIUM SCORE 161/ POSS. MODERATE MID LAD STENOSIS  . Cystoscopy w/ ureteral stent removal  04/28/2011    Procedure: CYSTOSCOPY WITH STENT REMOVAL;  Surgeon: Antony Haste, MD;  Location: [facility];  Service: Urology;;  . Cystoscopy w/ retrogrades  04/28/2011    Procedure: CYSTOSCOPY WITH RETROGRADE PYELOGRAM;  Surgeon: Antony Haste, MD;  Location: [facility];  Service: Urology;  Laterality: Left;    Family History  Problem Relation Age of Onset  . Arthritis Mother   . Heart disease Mother   . Diabetes Sister   . Hypertension Sister   . Stroke Sister     History  Substance Use Topics  . Smoking status: Former Smoker -- 2.00 packs/day  for 52 years    Types: Cigarettes    Quit date: 03/24/2008  . Smokeless tobacco: Never Used  . Alcohol Use: Yes     Comment: RARE    OB History   Grav Para Term Preterm Abortions TAB SAB Ect Mult Living                  Review of Systems  Constitutional: Negative for fever and fatigue.  Gastrointestinal: Negative for abdominal pain and diarrhea.  Skin: Positive for rash.  All other systems reviewed and are negative.    Allergies  Chlorhexidine base and Penicillins  Home Medications   Current Outpatient Rx  Name  Route  Sig  Dispense  Refill  . ALPRAZolam (XANAX) 1 MG tablet   Oral   Take 1 tablet (1 mg total) by  mouth 2 (two) times daily.   60 tablet   5   . buPROPion (WELLBUTRIN XL) 300 MG 24 hr tablet   Oral   Take 1 tablet (300 mg total) by mouth daily.   90 tablet   3   . chlorhexidine (PERIDEX) 0.12 % solution   Mouth Rinse   5 mLs by Mouth Rinse route Twice daily.         . cholecalciferol (VITAMIN D) 400 UNITS TABS   Oral   Take 400 Units by mouth daily.          . citalopram (CELEXA) 40 MG tablet   Oral   Take 1 tablet (40 mg total) by mouth daily.   90 tablet   3   . co-enzyme Q-10 30 MG capsule   Oral   Take 30 mg by mouth daily.          Marland Kitchen doxycycline (VIBRAMYCIN) 100 MG capsule   Oral   Take 1 capsule (100 mg total) by mouth 2 (two) times daily.   14 capsule   0   . fluticasone (FLONASE) 50 MCG/ACT nasal spray      instill 2 sprays into each nostril once daily   16 g   6   . lisinopril (PRINIVIL,ZESTRIL) 5 MG tablet      take 1 tablet by mouth once daily   90 tablet   1     Refill Approved with Changes   . loratadine (CLARITIN) 10 MG tablet   Oral   Take 10 mg by mouth daily.          Marland Kitchen NITROSTAT 0.4 MG SL tablet      PLACE 1 TAB UNDER TONGUE.Marland Kitchen TAKE TABS 5 MINS APART.. UP TO 3 TABS   25 each   12   . omeprazole (PRILOSEC) 20 MG capsule   Oral   Take 20 mg by mouth every morning.         Marland Kitchen oxyCODONE (OXY IR/ROXICODONE) 5 MG immediate release tablet   Oral   Take 1 tablet (5 mg total) by mouth every 4 (four) hours as needed for pain.   30 tablet   0   . Pitavastatin Calcium 2 MG TABS   Oral   Take 1 tablet (2 mg total) by mouth daily.   30 tablet   11   . warfarin (COUMADIN) 2.5 MG tablet      Take 5 mg daily except 7.5 mg on MWF or as directed   72 tablet   3   . warfarin (COUMADIN) 5 MG tablet      Take 5 mg daily except 7.5 mg on  MWF or as directed   36 tablet   3     BP 119/75  Pulse 75  Temp(Src) 98.5 F (36.9 C) (Oral)  Ht 5\' 6"  (1.676 m)  Wt 238 lb (107.956 kg)  BMI 38.43 kg/m2  SpO2 97%  Physical Exam   Nursing note and vitals reviewed. Constitutional: She is oriented to person, place, and time. She appears well-developed and well-nourished. No distress.  Patient is obese (BMI 38.4)  HENT:  Head: Atraumatic.  Eyes: Conjunctivae are normal. Pupils are equal, round, and reactive to light.  Musculoskeletal: She exhibits no edema.       Left lower leg: She exhibits tenderness. She exhibits no bony tenderness and no swelling.       Legs: Left lower leg pre-tibial area reveals a 4cm by 7cm erythematous area, slightly tender and warm, but without swelling or fluctuance.  No calf tenderness.  Neurological: She is alert and oriented to person, place, and time.  Skin: Skin is warm and dry. There is erythema.    ED Course  Procedures        1. Cellulitis of leg, left       MDM  Begin doxycycline.  Administered Tdap Elevate left leg.  Apply warm compress 3 or 4 times daily. If symptoms become significantly worse during the night or over the weekend, proceed to the local emergency room. Followup with Family Doctor if not improved in three days.         Lattie Haw, MD 05/23/12 [phone]

## 2012-05-26 ENCOUNTER — Ambulatory Visit (INDEPENDENT_AMBULATORY_CARE_PROVIDER_SITE_OTHER): Payer: Medicare Other | Admitting: Family Medicine

## 2012-05-26 ENCOUNTER — Encounter: Payer: Self-pay | Admitting: Family Medicine

## 2012-05-26 ENCOUNTER — Telehealth: Payer: Self-pay | Admitting: *Deleted

## 2012-05-26 VITALS — BP 120/90 | Temp 98.0°F | Wt 235.0 lb

## 2012-05-26 DIAGNOSIS — L03119 Cellulitis of unspecified part of limb: Secondary | ICD-10-CM

## 2012-05-26 DIAGNOSIS — L03116 Cellulitis of left lower limb: Secondary | ICD-10-CM

## 2012-05-26 MED ORDER — DOXYCYCLINE HYCLATE 100 MG PO CAPS: 100.0000 mg | ORAL_CAPSULE | Freq: Two times a day (BID) | ORAL | Status: DC

## 2012-05-26 NOTE — Patient Instructions (Signed)
Continue elevation and heat to leg Follow up for any fever or increased warmth.

## 2012-05-26 NOTE — Progress Notes (Signed)
  Subjective:    Patient ID: [name], female    DOB: 09/03/1945, 67 y.o.   MRN: [id number]  HPI Patient seen from urgent care followup. She was in [place] on Sunday. She recalls 40s prior to that dog scratch left lower leg. Patient noticed redness and tenderness to touch. No drainage. Given tetanus booster. Started doxycycline. At this point, she has no warmth and no fever or chills. Less tender. Erythema has not improved much though  Patient takes Coumadin and has INR scheduled for next week. No bleeding complications.   Review of Systems  Constitutional: Negative for fever and chills.       Objective:   Physical Exam  Constitutional: She appears well-developed and well-nourished.  Cardiovascular: Normal rate and regular rhythm.   Pulmonary/Chest: Effort normal and breath sounds normal. No respiratory distress. She has no wheezes. She has no rales.  Skin:  Left leg reveals an area of erythema about 4 x 7 cm. No warmth whatsoever. Nontender. No pustules. Nonfluctuant.          Assessment & Plan:  Cellulitis left leg. She has persistent erythema but no other indicators of active infection. She has no warmth and nontender.  Refill doxycycline for another 7 days. If she develops any warmth or evidence for worsening consider better coverage for strep

## 2012-06-07 ENCOUNTER — Ambulatory Visit: Payer: Medicare Other

## 2012-06-07 ENCOUNTER — Other Ambulatory Visit: Payer: Medicare Other | Admitting: Lab

## 2012-06-10 ENCOUNTER — Ambulatory Visit (HOSPITAL_BASED_OUTPATIENT_CLINIC_OR_DEPARTMENT_OTHER): Payer: Medicare Other | Admitting: Pharmacist

## 2012-06-10 ENCOUNTER — Other Ambulatory Visit: Payer: Medicare Other

## 2012-06-10 DIAGNOSIS — I82402 Acute embolism and thrombosis of unspecified deep veins of left lower extremity: Secondary | ICD-10-CM

## 2012-06-10 DIAGNOSIS — I824Z9 Acute embolism and thrombosis of unspecified deep veins of unspecified distal lower extremity: Secondary | ICD-10-CM

## 2012-06-10 DIAGNOSIS — I82409 Acute embolism and thrombosis of unspecified deep veins of unspecified lower extremity: Secondary | ICD-10-CM

## 2012-06-10 LAB — PROTIME-INR

## 2012-06-10 NOTE — Progress Notes (Signed)
INR within goal today. No problems to report regarding anticoagulation. Continue 5 mg/day; 7.5 mg MWF Recheck INR on 07/06/12 at 2pm for lab and 2:15 for coumadin clinic.

## 2012-06-10 NOTE — Patient Instructions (Addendum)
Continue 5 mg/day; 7.5 mg MWF Recheck INR on 07/06/12 at 2pm for lab and 2:15 for coumadin clinic.

## 2012-06-13 ENCOUNTER — Other Ambulatory Visit: Payer: Self-pay | Admitting: Cardiology

## 2012-06-15 ENCOUNTER — Telehealth: Payer: Self-pay | Admitting: Family Medicine

## 2012-06-15 MED ORDER — ALPRAZOLAM 1 MG PO TABS: 1.0000 mg | ORAL_TABLET | Freq: Two times a day (BID) | ORAL | Status: DC

## 2012-06-15 MED ORDER — OXYCODONE HCL 5 MG PO TABS: 5.0000 mg | ORAL_TABLET | ORAL | Status: DC | PRN

## 2012-06-15 NOTE — Telephone Encounter (Signed)
Refill pain med once and Xanax for 6 months.

## 2012-06-15 NOTE — Telephone Encounter (Signed)
Pt needs refill of ALPRAZolam (XANAX) 1 MG tablet  Pt would like refill of oxyCODONE (OXY IR/ROXICODONE) 5 MG immediate release tablet Pt would like to pick up the script for Xanax at the same time she picks up the oxyCODONE. She is going to take both of them at the same time.  Pt would like to pick up Friday.

## 2012-06-15 NOTE — Telephone Encounter (Signed)
Oxycodone prn pain last filled #30 with 0 refills 12-31-11 Xanax BID last filled #60 with 5 refills on 12-31-11

## 2012-06-15 NOTE — Telephone Encounter (Signed)
Pt informed Rx ready to pick up

## 2012-06-30 ENCOUNTER — Ambulatory Visit: Payer: Medicare Other | Admitting: Family Medicine

## 2012-07-06 ENCOUNTER — Other Ambulatory Visit: Payer: Medicare Other | Admitting: Lab

## 2012-07-06 ENCOUNTER — Ambulatory Visit: Payer: Medicare Other

## 2012-07-07 ENCOUNTER — Ambulatory Visit (HOSPITAL_BASED_OUTPATIENT_CLINIC_OR_DEPARTMENT_OTHER): Payer: Medicare Other | Admitting: Pharmacist

## 2012-07-07 ENCOUNTER — Other Ambulatory Visit (HOSPITAL_BASED_OUTPATIENT_CLINIC_OR_DEPARTMENT_OTHER): Payer: Medicare Other | Admitting: Lab

## 2012-07-07 DIAGNOSIS — I82409 Acute embolism and thrombosis of unspecified deep veins of unspecified lower extremity: Secondary | ICD-10-CM

## 2012-07-07 DIAGNOSIS — I824Z9 Acute embolism and thrombosis of unspecified deep veins of unspecified distal lower extremity: Secondary | ICD-10-CM

## 2012-07-07 DIAGNOSIS — I82402 Acute embolism and thrombosis of unspecified deep veins of left lower extremity: Secondary | ICD-10-CM

## 2012-07-07 LAB — PROTIME-INR: INR: 4.4 — ABNORMAL HIGH (ref 2.00–3.50)

## 2012-07-07 NOTE — Progress Notes (Signed)
INR above goal of 2-3.  No changes in medication or diet.  [name] has been very stable on current coumadin dose.  Will hold today's coumadin dose and then continue coumadin 7.5mg  MWF and 5mg  other days.  Check INR in 2 weeks.

## 2012-07-22 ENCOUNTER — Ambulatory Visit: Payer: Medicare Other

## 2012-07-22 ENCOUNTER — Other Ambulatory Visit: Payer: Medicare Other | Admitting: Lab

## 2012-07-26 ENCOUNTER — Ambulatory Visit: Payer: Medicare Other | Admitting: Pharmacist

## 2012-07-26 ENCOUNTER — Other Ambulatory Visit (HOSPITAL_BASED_OUTPATIENT_CLINIC_OR_DEPARTMENT_OTHER): Payer: Medicare Other | Admitting: Lab

## 2012-07-26 DIAGNOSIS — I82402 Acute embolism and thrombosis of unspecified deep veins of left lower extremity: Secondary | ICD-10-CM

## 2012-07-26 DIAGNOSIS — I82409 Acute embolism and thrombosis of unspecified deep veins of unspecified lower extremity: Secondary | ICD-10-CM

## 2012-07-26 LAB — PROTIME-INR: INR: 3 (ref 2.00–3.50)

## 2012-07-26 NOTE — Progress Notes (Signed)
INR at goal of 2-3 today.  [name] began taking Ocuvite for eye health about 2 weeks ago.  Ocuvite contains omega 3 fatty acid, which could potentially interfere with coumadin.  It also contains lutein and zeaxanthine which should not interact with coumadin.  Will continue current coumadin dose of 7.5mg  MWF and 5mg  other days.  Since [name] has been taking Ocuvite for about 2 weeks, the interaction with coumadin would be observed by this time therefore with schedule next PT/INR in 1 month.

## 2012-08-25 ENCOUNTER — Ambulatory Visit (HOSPITAL_BASED_OUTPATIENT_CLINIC_OR_DEPARTMENT_OTHER): Payer: Medicare Other | Admitting: Pharmacist

## 2012-08-25 ENCOUNTER — Other Ambulatory Visit (HOSPITAL_BASED_OUTPATIENT_CLINIC_OR_DEPARTMENT_OTHER): Payer: Medicare Other | Admitting: Lab

## 2012-08-25 DIAGNOSIS — I824Z9 Acute embolism and thrombosis of unspecified deep veins of unspecified distal lower extremity: Secondary | ICD-10-CM

## 2012-08-25 DIAGNOSIS — I82402 Acute embolism and thrombosis of unspecified deep veins of left lower extremity: Secondary | ICD-10-CM

## 2012-08-25 DIAGNOSIS — I82409 Acute embolism and thrombosis of unspecified deep veins of unspecified lower extremity: Secondary | ICD-10-CM

## 2012-08-25 LAB — PROTIME-INR: INR: 3.3 (ref 2.00–3.50)

## 2012-08-25 NOTE — Patient Instructions (Signed)
Take 2.5mg  today only.  Then on 08/26/12, continue usual dose of 5 mg/day except 7.5 mg MWF.  Recheck INR in 3 weeks on 09/15/12 at 2:00pm for lab and 2:15pm for coumadin clinic.

## 2012-08-25 NOTE — Progress Notes (Signed)
INR slightly above goal today. Pt doing well. No problems regarding anticoagulation. No bleeding. No unusual bruising. Pt INR tends to fluctuate slightly but is fairly stable on this dose. No changes in medications, diet, etc to report. Take Coumadin 2.5mg  today only.  Then on 08/26/12, continue usual dose of 5 mg/day except 7.5 mg MWF.  Recheck INR in 3 weeks on 09/15/12 at 2:00pm for lab and 2:15pm for coumadin clinic.

## 2012-09-15 ENCOUNTER — Ambulatory Visit (HOSPITAL_BASED_OUTPATIENT_CLINIC_OR_DEPARTMENT_OTHER): Payer: Medicare Other | Admitting: Pharmacist

## 2012-09-15 ENCOUNTER — Other Ambulatory Visit (HOSPITAL_BASED_OUTPATIENT_CLINIC_OR_DEPARTMENT_OTHER): Payer: Medicare Other | Admitting: Lab

## 2012-09-15 DIAGNOSIS — I82409 Acute embolism and thrombosis of unspecified deep veins of unspecified lower extremity: Secondary | ICD-10-CM

## 2012-09-15 LAB — PROTIME-INR
INR: 2.1 (ref 2.00–3.50)
Protime: 25.2 Seconds — ABNORMAL HIGH (ref 10.6–13.4)

## 2012-09-15 NOTE — Progress Notes (Signed)
INR = 2.1 on Coumadin 5 mg/day except 7.5 mg MWF Missed one dose Sunday (5 mg). No complaints today. INR at goal.  Continue same dose. Return in 1 month for protime check. Ebony Hail, Pharm.D., CPP 09/15/2012@2 :26 PM

## 2012-10-06 ENCOUNTER — Telehealth: Payer: Self-pay | Admitting: Pharmacist

## 2012-10-06 DIAGNOSIS — I82402 Acute embolism and thrombosis of unspecified deep veins of left lower extremity: Secondary | ICD-10-CM

## 2012-10-06 NOTE — Telephone Encounter (Signed)
Spoke with patient by phone. She was moving furniture and cut her leg (on a varicose vein). It was bleeding but now she has it bandaged and it has stopped. It is not bleeding through the bandage. She plans to be evaluated today (by PCP or nearby urgent care) if the bleeding continues. Will schedule pt for lab/Coumadin clinic tomorrow, 7/18. Pt will take her coumadin tonight if bleeding stops/stays controlled. If bleeding continues or restarts, pt will skip her coumadin tonight. Will check CBC and PT/INR tomorrow.

## 2012-10-07 ENCOUNTER — Ambulatory Visit: Payer: Medicare Other | Admitting: Pharmacist

## 2012-10-07 ENCOUNTER — Emergency Department (HOSPITAL_COMMUNITY)
Admission: EM | Admit: 2012-10-07 | Discharge: 2012-10-07 | Disposition: A | Discharge status: Home / Self Care | Payer: Medicare Other | Attending: Emergency Medicine | Admitting: Emergency Medicine

## 2012-10-07 ENCOUNTER — Other Ambulatory Visit (HOSPITAL_BASED_OUTPATIENT_CLINIC_OR_DEPARTMENT_OTHER): Payer: Medicare Other | Admitting: Lab

## 2012-10-07 ENCOUNTER — Encounter (HOSPITAL_COMMUNITY): Payer: Self-pay | Admitting: *Deleted

## 2012-10-07 DIAGNOSIS — Z8619 Personal history of other infectious and parasitic diseases: Secondary | ICD-10-CM | POA: Insufficient documentation

## 2012-10-07 DIAGNOSIS — I83893 Varicose veins of bilateral lower extremities with other complications: Secondary | ICD-10-CM | POA: Insufficient documentation

## 2012-10-07 DIAGNOSIS — R791 Abnormal coagulation profile: Secondary | ICD-10-CM | POA: Insufficient documentation

## 2012-10-07 DIAGNOSIS — Y9389 Activity, other specified: Secondary | ICD-10-CM | POA: Insufficient documentation

## 2012-10-07 DIAGNOSIS — Z87891 Personal history of nicotine dependence: Secondary | ICD-10-CM | POA: Insufficient documentation

## 2012-10-07 DIAGNOSIS — I82409 Acute embolism and thrombosis of unspecified deep veins of unspecified lower extremity: Secondary | ICD-10-CM

## 2012-10-07 DIAGNOSIS — Z86718 Personal history of other venous thrombosis and embolism: Secondary | ICD-10-CM | POA: Insufficient documentation

## 2012-10-07 DIAGNOSIS — W2209XA Striking against other stationary object, initial encounter: Secondary | ICD-10-CM | POA: Insufficient documentation

## 2012-10-07 DIAGNOSIS — F3289 Other specified depressive episodes: Secondary | ICD-10-CM | POA: Insufficient documentation

## 2012-10-07 DIAGNOSIS — Z7901 Long term (current) use of anticoagulants: Secondary | ICD-10-CM | POA: Insufficient documentation

## 2012-10-07 DIAGNOSIS — E785 Hyperlipidemia, unspecified: Secondary | ICD-10-CM | POA: Insufficient documentation

## 2012-10-07 DIAGNOSIS — S81009A Unspecified open wound, unspecified knee, initial encounter: Secondary | ICD-10-CM | POA: Insufficient documentation

## 2012-10-07 DIAGNOSIS — Z8742 Personal history of other diseases of the female genital tract: Secondary | ICD-10-CM | POA: Insufficient documentation

## 2012-10-07 DIAGNOSIS — S81809A Unspecified open wound, unspecified lower leg, initial encounter: Secondary | ICD-10-CM | POA: Insufficient documentation

## 2012-10-07 DIAGNOSIS — Z8669 Personal history of other diseases of the nervous system and sense organs: Secondary | ICD-10-CM | POA: Insufficient documentation

## 2012-10-07 DIAGNOSIS — Z880 Allergy status to penicillin: Secondary | ICD-10-CM | POA: Insufficient documentation

## 2012-10-07 DIAGNOSIS — Z9889 Other specified postprocedural states: Secondary | ICD-10-CM | POA: Insufficient documentation

## 2012-10-07 DIAGNOSIS — Z96659 Presence of unspecified artificial knee joint: Secondary | ICD-10-CM | POA: Insufficient documentation

## 2012-10-07 DIAGNOSIS — F329 Major depressive disorder, single episode, unspecified: Secondary | ICD-10-CM | POA: Insufficient documentation

## 2012-10-07 DIAGNOSIS — I82402 Acute embolism and thrombosis of unspecified deep veins of left lower extremity: Secondary | ICD-10-CM

## 2012-10-07 DIAGNOSIS — K219 Gastro-esophageal reflux disease without esophagitis: Secondary | ICD-10-CM | POA: Insufficient documentation

## 2012-10-07 DIAGNOSIS — IMO0002 Reserved for concepts with insufficient information to code with codable children: Secondary | ICD-10-CM | POA: Insufficient documentation

## 2012-10-07 DIAGNOSIS — F411 Generalized anxiety disorder: Secondary | ICD-10-CM | POA: Insufficient documentation

## 2012-10-07 DIAGNOSIS — Z79899 Other long term (current) drug therapy: Secondary | ICD-10-CM | POA: Insufficient documentation

## 2012-10-07 DIAGNOSIS — Y929 Unspecified place or not applicable: Secondary | ICD-10-CM | POA: Insufficient documentation

## 2012-10-07 DIAGNOSIS — Z87448 Personal history of other diseases of urinary system: Secondary | ICD-10-CM | POA: Insufficient documentation

## 2012-10-07 DIAGNOSIS — I2510 Atherosclerotic heart disease of native coronary artery without angina pectoris: Secondary | ICD-10-CM | POA: Insufficient documentation

## 2012-10-07 DIAGNOSIS — Z8709 Personal history of other diseases of the respiratory system: Secondary | ICD-10-CM | POA: Insufficient documentation

## 2012-10-07 DIAGNOSIS — Z8739 Personal history of other diseases of the musculoskeletal system and connective tissue: Secondary | ICD-10-CM | POA: Insufficient documentation

## 2012-10-07 LAB — CBC WITH DIFFERENTIAL/PLATELET
Basophils Absolute: 0 10*3/uL (ref 0.0–0.1)
Eosinophils Absolute: 0.1 10*3/uL (ref 0.0–0.5)
HGB: 12 g/dL (ref 11.6–15.9)
LYMPH%: 35.3 % (ref 14.0–49.7)
MCV: 88.9 fL (ref 79.5–101.0)
MONO%: 9.7 % (ref 0.0–14.0)
NEUT#: 2.5 10*3/uL (ref 1.5–6.5)
NEUT%: 51.9 % (ref 38.4–76.8)
Platelets: 173 10*3/uL (ref 145–400)
RBC: 4.05 10*6/uL (ref 3.70–5.45)

## 2012-10-07 LAB — PROTIME-INR: Protime: 45.6 Seconds — ABNORMAL HIGH (ref 10.6–13.4)

## 2012-10-07 NOTE — Patient Instructions (Addendum)
INR above goal  Hold coumadin today.   Proceed to ED for evaluation for stitching.   Hold coumadin over weekend due to bleeding and high INR.   We will call you on Monday to evaluate appropriateness to restart coumadin

## 2012-10-07 NOTE — Progress Notes (Signed)
NO CHARGE patient sent to ED  INR elevated today at 3.8. Pt cut leg on metal bed frame yesterday 7/17 which resulted in a varicose vein being opened. This resulted in significant bleeding that was suppressed with pressure and gauze bandages. Patient was instructed on 10/05/12 (via phone call) to not take her coumadin if the bleeding continued. The patient stated there was no bleeding seen through the bandage and gauze so she took her coumadin dose last night. When she came into the clinic today to be evaluated the bleeding continued when the bandage was removed and the cut appeared to be significant. Upon discussion with Dr. Karel Jarvis and evaluation of patient by Dimas Alexandria, PA it was agreed upon and decided to send patient to ED to be evaluated for possible stitching or other interventions as needed. The plan for coumadin today and this weekend is: patient will hold coumadin today as INR is elevated and patient will also hold coumadin on Saturday and Sunday due to the significant bleed/wound as well as the possibility of a procedure being performed today in the ED (whether that be stitching or another intervention). This decision was based on patients history and clotting risk. Her lower extremity DVT was diagnosed in December 2005 right around the time she was diagnosed with Lymphoma and began treatment. She had a recurrence around Easter of 2006 when patient was still undergoing chemotherapy. She has not had an episode of DVT since so it was determined that based on her clot risk she could hold her coumadin for these next three days over the weekend. She has done this before for other procedures when she has her renal stents replaced (she has held coumadin for ~ 3 days in the past for these procedures). We will call patient on Monday 10/10/12 to evaluate appropriateness for restarting her coumadin and to decide if patient should come back to clinic next week to have lab and be seen in the coumadin clinic

## 2012-10-07 NOTE — ED Provider Notes (Signed)
History    This chart was scribed for non-physician practitioner Sharilyn Sites, PA working with Candyce Churn, MD by Quintella Reichert, ED Scribe. This patient was seen in room WTR7/WTR7 and the patient's care was started at 3:38 PM.   CSN: [id number]  Arrival date & time 10/07/12  1515    Chief Complaint  Patient presents with  . Extremity Laceration    The history is provided by the patient. No language interpreter was used.    HPI Comments: [name] is a 67 y.o. female on chronic Coumadin therapy who presents to the Emergency Department complaining of a laceration to her left lower leg that she sustained yesterday.  Pt reports that she was flipping mattresses when she cut the leg on a box spring and the area immediately began bleeding.  She states that she believes she cut a varicose vein.  She wrapped the area in gauze, which controlled the bleeding.  However several hours ago she took the gauze off and the area began "gushing again."   Presently bleeding is controlled without gauze.  She denies weakness, numbness or tingling to the area.  She denies injury to any other area.  Pt also notes that her INR was elevated when she went to the Coumadin Clinic earlier today (INR 3.8) and she was advised to hold coumadin until Monday (3 days) and have INR re-checked.  Past Medical History  Diagnosis Date  . DYSLIPIDEMIA 02/27/2009  . DEPRESSION 06/13/2008  . ALLERGIC RHINITIS 08/01/2009  . ABDOMINAL PAIN RIGHT UPPER QUADRANT 11/08/2008  . CARDIOVASCULAR FUNCTION STUDY, ABNORMAL 04/04/2009  . NON-HODGKIN'S LYMPHOMA, HX OF dx  2005--  chemoradiation completed 2006--  no recurrence    onocologist- dr odogwu--   . OSTEOARTHRITIS, MODERATE 04/07/2010  . CAD, NATIVE VESSEL 05/30/2009    cardiologist- dr Shirlee Latch- visit 04-13-2010 in epic-- denies cardiac symptoms  . DYSPNEA 02/27/2009  . Retroperitoneal fibrosis   . Frequency of urination   . History of Lyme disease   . History of Bell's  palsy   . DDD (degenerative disc disease), lumbar   . GERD (gastroesophageal reflux disease)   . Anxiety   . 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 History of DVT of lower extremity 2006-- CHRONIC COUMADIN THERAPY  . Anticoagulated on Coumadin    Past Surgical History  Procedure Laterality Date  . Multiple cysto/ left ureteral stent exchanges  LAST ONE 10-28-10  . Total knee arthroplasty  OCT 2010    RIGHT  . Total knee arthroplasty  JAN 2010    LEFT  . Knee arthroscopy  2005    RIGHT  . Breast mass excision      BENIGN-- RIGHT BREAST  . Retroperitoneal bx  2007  . Exploratory laparotomy  2005    LYMPHOMA  . Tonsillectomy  CHILD  . Cardiovascular stress test  04-04-2009-- PER PT ASYMPTOMATIC-- MEDICAL MANAGEMENT    STUDY WAS EQUIVOCAL/ EF 49%/ GLOBAL HYPOKINESIS/ APPEARS TO BE A MILD ANTERIOR PERFUSION DEFECT COULD REPRESENT ISCHEMIA BUT DUE TO  ACTIVITY WORSE IN STRESS THAN AT REST POSS. THE DEFECT WAS ARTIFACTUAL  . Transthoracic echocardiogram  05-03-2009    MILD SYSTOLIC DYSFUNCTION/ EF 45-50%/ MODERATE DIASTOLIC DYSFUCTION/ MILD MR/ MILD BIATRIAL ENLARGEMENT  . Ct angiogram  FEB 2011    CALCIUM SCORE 161/ POSS. MODERATE MID LAD STENOSIS  . Cystoscopy w/ ureteral stent removal  04/28/2011    Procedure: CYSTOSCOPY WITH STENT REMOVAL;  Surgeon: Antony Haste, MD;  Location:  [facility];  Service: Urology;;  . Cystoscopy w/ retrogrades  04/28/2011    Procedure: CYSTOSCOPY WITH RETROGRADE PYELOGRAM;  Surgeon: Antony Haste, MD;  Location: [facility];  Service: Urology;  Laterality: Left;    Family History  Problem Relation Age of Onset  . Arthritis Mother   . Heart disease Mother   . Diabetes Sister   . Hypertension Sister   . Stroke Sister     History  Substance Use Topics  . Smoking status: Former Smoker -- 2.00 packs/day for 52 years    Types: Cigarettes    Quit date: 03/24/2008  .  Smokeless tobacco: Never Used  . Alcohol Use: Yes     Comment: RARE    OB History   Grav Para Term Preterm Abortions TAB SAB Ect Mult Living                   Review of Systems  Skin: Positive for wound.  All other systems reviewed and are negative.      Allergies  Chlorhexidine base and Penicillins  Home Medications   Current Outpatient Rx  Name  Route  Sig  Dispense  Refill  . ALPRAZolam (XANAX) 1 MG tablet   Oral   Take 1 tablet (1 mg total) by mouth 2 (two) times daily.   60 tablet   5   . beta carotene w/minerals (OCUVITE) tablet   Oral   Take 1 tablet by mouth daily.         Marland Kitchen buPROPion (WELLBUTRIN XL) 300 MG 24 hr tablet   Oral   Take 1 tablet (300 mg total) by mouth daily.   90 tablet   3   . chlorhexidine (PERIDEX) 0.12 % solution   Mouth Rinse   5 mLs by Mouth Rinse route Twice daily.         . cholecalciferol (VITAMIN D) 400 UNITS TABS   Oral   Take 400 Units by mouth daily.          . citalopram (CELEXA) 40 MG tablet   Oral   Take 1 tablet (40 mg total) by mouth daily.   90 tablet   3   . co-enzyme Q-10 30 MG capsule   Oral   Take 30 mg by mouth daily.          . fluticasone (FLONASE) 50 MCG/ACT nasal spray      instill 2 sprays into each nostril once daily   16 g   6   . lisinopril (PRINIVIL,ZESTRIL) 5 MG tablet      take 1 tablet by mouth once daily   90 tablet   1   . loratadine (CLARITIN) 10 MG tablet   Oral   Take 10 mg by mouth daily.          Marland Kitchen NITROSTAT 0.4 MG SL tablet      PLACE 1 TAB UNDER TONGUE.Marland Kitchen TAKE TABS 5 MINS APART.. UP TO 3 TABS   25 each   12   . omeprazole (PRILOSEC) 20 MG capsule   Oral   Take 20 mg by mouth every morning.         Marland Kitchen oxyCODONE (OXY IR/ROXICODONE) 5 MG immediate release tablet   Oral   Take 1 tablet (5 mg total) by mouth every 4 (four) hours as needed for pain.   30 tablet   0   . Pitavastatin Calcium 2 MG TABS   Oral   Take 1  tablet (2 mg total) by mouth daily.    30 tablet   11   . warfarin (COUMADIN) 2.5 MG tablet      Take 5 mg daily except 7.5 mg on MWF or as directed   72 tablet   3   . warfarin (COUMADIN) 5 MG tablet      Take 5 mg daily except 7.5 mg on MWF or as directed   36 tablet   3    BP 119/61  Pulse 74  Temp(Src) 97.9 F (36.6 C) (Oral)  Resp 16  SpO2 99%  Physical Exam  Nursing note and vitals reviewed. Constitutional: She is oriented to person, place, and time. She appears well-developed and well-nourished. No distress.  HENT:  Head: Normocephalic and atraumatic.  Eyes: Conjunctivae and EOM are normal.  Neck: Normal range of motion. Neck supple.  Cardiovascular: Normal rate, regular rhythm, normal heart sounds and intact distal pulses.   Pulmonary/Chest: Effort normal and breath sounds normal. No respiratory distress.  Musculoskeletal: Normal range of motion.       Legs: 1cm laceration to left shin, no active bleeding, no surrounding erythema, swelling, induration, or signs of infection/cellulitis, strong distal pulses, sensation intact  Neurological: She is alert and oriented to person, place, and time.  Skin: Skin is warm and dry.  Psychiatric: She has a normal mood and affect.    ED Course  Procedures (including critical care time)  DIAGNOSTIC STUDIES: Oxygen Saturation is 99% on room air, normal by my interpretation.    COORDINATION OF CARE: 3:42 PM- Informed pt that suturing is not necessary. Discussed treatment plan which includes bandaging the area with pt at bedside and pt agreed to plan.     Labs Reviewed - No data to display  No results found.  1. Ruptured varicose vein   2. Supratherapeutic INR     MDM   Laceration is small, no active bleeding at this time-- do not feel that sutures are required.  Quik clot and pressure dressing applied.  Instructed she may leave in place for up to 24 hours.  Hold coumadin for the next 3 days as instructed by coumadin clinic due to elevated INR (labs  reviewed, currently 3.8), and FU with them for re-check of INR on Monday.    Instructed to return for new sx including uncontrollable bleeding.  Discussed plan with pt, she agreed.  I personally performed the services described in this documentation, which was scribed in my presence. The recorded information has been reviewed and is accurate.    Garlon Hatchet, PA-C 10/07/12 1558

## 2012-10-07 NOTE — ED Notes (Signed)
Pt states was flipping mattresses when she cut her L leg on the box spring, states she is on coumadin and bleeding wouldn't stop, bleeding controlled at this time.

## 2012-10-10 ENCOUNTER — Telehealth: Payer: Self-pay | Admitting: Pharmacist

## 2012-10-10 NOTE — ED Provider Notes (Signed)
Medical screening examination/treatment/procedure(s) were performed by non-physician practitioner and as supervising physician I was immediately available for consultation/collaboration.  Dozier Berkovich David Dee Paden, MD 10/10/12 1435 

## 2012-10-10 NOTE — Telephone Encounter (Signed)
Spoke with pt by phone today. Very minimal bleeding noted on bandage over the weekend. Area on leg is tender, starting to bruise. Pt stated it is much better. She feels good. Pt did not receive sutures during ED visit on 7/18 for cut on varicose vein from 7/17. Ok to restart coumadin today at usual dose.  Will recheck INR in 10days on 10/19/12. Pt will call us if she has any questions or concerns before that time.

## 2012-10-13 ENCOUNTER — Ambulatory Visit: Payer: Medicare Other

## 2012-10-13 ENCOUNTER — Other Ambulatory Visit: Payer: Medicare Other | Admitting: Lab

## 2012-10-19 ENCOUNTER — Ambulatory Visit (HOSPITAL_BASED_OUTPATIENT_CLINIC_OR_DEPARTMENT_OTHER): Payer: Medicare Other | Admitting: Pharmacist

## 2012-10-19 ENCOUNTER — Other Ambulatory Visit (HOSPITAL_BASED_OUTPATIENT_CLINIC_OR_DEPARTMENT_OTHER): Payer: Medicare Other | Admitting: Lab

## 2012-10-19 ENCOUNTER — Other Ambulatory Visit: Payer: Self-pay | Admitting: Cardiology

## 2012-10-19 DIAGNOSIS — I824Z9 Acute embolism and thrombosis of unspecified deep veins of unspecified distal lower extremity: Secondary | ICD-10-CM

## 2012-10-19 DIAGNOSIS — I82402 Acute embolism and thrombosis of unspecified deep veins of left lower extremity: Secondary | ICD-10-CM

## 2012-10-19 LAB — POCT INR: INR: 2.2

## 2012-10-19 NOTE — Progress Notes (Signed)
Patient's INR is therapeutic today at 2.2 (goal 2-3).    Gets bruises frequently by "banging into things." Her leg that was bleeding last week appears to be healing well and improving per the patient.  She reports no stitches placed at the ER but they applied a type of covering to stop the bleed.  Patient has changed from Prilosec to Nexium and has been taking Nexium for about 4 days. After informing patient that Nexium could increase the INR, she wishes to go back to Prilosec.  Continue taking Coumadin 5 mg daily except for 7.5 mg on Monday, Wednesday, and Friday. Recheck INR in about 3 weeks on 11/07/12; lab at 2:00 pm and Coumadin clinic at 2:15 pm.  Lillia Pauls, PharmD Clinical Pharmacist 10/19/2012 4:44 PM

## 2012-10-19 NOTE — Patient Instructions (Addendum)
Continue taking Coumadin 5 mg daily except for 7.5 mg on Monday, Wednesday, and Friday. Recheck INR in about 3 weeks on 11/07/12; lab at 2:00 pm and Coumadin clinic at 2:15 pm.

## 2012-10-20 NOTE — Telephone Encounter (Signed)
Pt wants Rx refill, the last refill was a year ago.[facility] they needed an appointment.

## 2012-10-20 NOTE — Telephone Encounter (Signed)
You can give her 30 days but she needs to make an appt for further refills.

## 2012-10-25 ENCOUNTER — Telehealth: Payer: Self-pay | Admitting: Family Medicine

## 2012-10-25 NOTE — Telephone Encounter (Signed)
Patient Information:  Caller Name: [place]  Phone: [phone]  Patient: [name], [name]  Gender: Female  DOB: 1945-07-07  Age: 67 Years  PCP: Evelena Peat (Family Practice)  Office Follow Up:  Does the office need to follow up with this patient?: No  Instructions For The Office: N/A  RN Note:  Patient is currently in [facility] and cannot get ready and get to the office this afternoon.  She agreed to go to the closest hospital and be seen this afternoon.  Going to [facility].  Symptoms  Reason For Call & Symptoms: Patient calling "has little flips in my heartbeat" for 2 weeks.  Describes as a weird feeling.  Denies any dizziness, chest pain or shortness of breath.  Has been "extraordinarily, ridiculously tired" for a month or two.  Also has tingling and numbness in her left great, middle and little toes.  Has had 2 blood clots in her left thigh twice.  She is currently on Coumadin with her last INR done last week and "it was normal .  Reviewed Health History In EMR: Yes  Reviewed Medications In EMR: Yes  Reviewed Allergies In EMR: Yes  Reviewed Surgeries / Procedures: Yes  Date of Onset of Symptoms: 10/11/2012  Guideline(s) Used:  Heart Rate and Heartbeat Questions  Disposition Per Guideline:   Go to ED Now (or to Office with PCP Approval)  Reason For Disposition Reached:   Patient sounds very sick or weak to the triager  Advice Given:  N/A  Patient Will Follow Care Advice:  YES

## 2012-11-03 DIAGNOSIS — I10 Essential (primary) hypertension: Secondary | ICD-10-CM | POA: Insufficient documentation

## 2012-11-07 ENCOUNTER — Other Ambulatory Visit: Payer: Medicare Other | Admitting: Lab

## 2012-11-07 ENCOUNTER — Ambulatory Visit (HOSPITAL_BASED_OUTPATIENT_CLINIC_OR_DEPARTMENT_OTHER): Payer: Medicare Other | Admitting: Pharmacist

## 2012-11-07 DIAGNOSIS — I824Z9 Acute embolism and thrombosis of unspecified deep veins of unspecified distal lower extremity: Secondary | ICD-10-CM

## 2012-11-07 DIAGNOSIS — I82402 Acute embolism and thrombosis of unspecified deep veins of left lower extremity: Secondary | ICD-10-CM

## 2012-11-07 DIAGNOSIS — I82409 Acute embolism and thrombosis of unspecified deep veins of unspecified lower extremity: Secondary | ICD-10-CM

## 2012-11-07 LAB — POCT INR: INR: 1.8

## 2012-11-07 LAB — PROTIME-INR
INR: 1.8 — ABNORMAL LOW (ref 2.00–3.50)
Protime: 21.6 Seconds — ABNORMAL HIGH (ref 10.6–13.4)

## 2012-11-07 NOTE — Progress Notes (Addendum)
INR slightly below goal today. No problems to report regarding anticoagulation. No s/s of clotting. No medication changes. No changes in diet. No missed coumadin doses. Pt is wearing a heart monitor to try to catch "blips" as she describes them. Pt has f/u with [facility] on 11/22/12. The cut on her left leg continues to heal well. Almost completely healed. INR's are fairly stable on this dose.  She does tend to have some variation with her INR's on this dose. Will continue Coumadin 5 mg daily except for 7.5mg  on MWF.  Recheck INR in about 3 weeks on 11/28/12; lab at 2:00pm and Coumadin clinic at 2:15pm. Will evaluate INR at this time and adjust coumadin dose as necessary.

## 2012-11-07 NOTE — Patient Instructions (Signed)
Continue taking Coumadin 5 mg daily except for 7.5mg  on MWF.  Recheck INR in about 3 weeks on 11/28/12; lab at 2:00pm and Coumadin clinic at 2:15pm.

## 2012-11-21 ENCOUNTER — Other Ambulatory Visit: Payer: Self-pay | Admitting: Cardiology

## 2012-11-22 ENCOUNTER — Ambulatory Visit: Payer: Medicare Other | Admitting: Nurse Practitioner

## 2012-11-28 ENCOUNTER — Ambulatory Visit (HOSPITAL_BASED_OUTPATIENT_CLINIC_OR_DEPARTMENT_OTHER): Payer: Medicare Other | Admitting: Pharmacist

## 2012-11-28 ENCOUNTER — Other Ambulatory Visit (HOSPITAL_BASED_OUTPATIENT_CLINIC_OR_DEPARTMENT_OTHER): Payer: Medicare Other | Admitting: Lab

## 2012-11-28 DIAGNOSIS — I824Z9 Acute embolism and thrombosis of unspecified deep veins of unspecified distal lower extremity: Secondary | ICD-10-CM

## 2012-11-28 DIAGNOSIS — I82402 Acute embolism and thrombosis of unspecified deep veins of left lower extremity: Secondary | ICD-10-CM

## 2012-11-28 DIAGNOSIS — I82409 Acute embolism and thrombosis of unspecified deep veins of unspecified lower extremity: Secondary | ICD-10-CM

## 2012-11-28 NOTE — Patient Instructions (Addendum)
INR at goal No changes Continue taking Coumadin 5 mg daily except for 7.5 mg on Monday, Wednesday, and Friday.  Recheck INR in about 4 weeks on 12/26/12; lab at 2:00pm and Coumadin clinic at 2:15pm.

## 2012-11-28 NOTE — Progress Notes (Signed)
INR at goal. Patient reports no issues regarding bleeding. She does have minor bruising on her arms and legs that are minimal and normal for her.  No missed doses or extra doses in the last month She continues to wear the heart monitor and she have this removed in a week or so as she will have been wearing it for 1 month We will keep her on the same dose of coumadin as she is stable. Continue taking Coumadin 5 mg daily except for 7.5 mg on Monday, Wednesday, and Friday.  Recheck INR in about 4 weeks on 12/26/12; lab at 2:00pm and Coumadin clinic at 2:15pm.

## 2012-11-30 ENCOUNTER — Encounter: Payer: Self-pay | Admitting: Family Medicine

## 2012-11-30 ENCOUNTER — Ambulatory Visit (INDEPENDENT_AMBULATORY_CARE_PROVIDER_SITE_OTHER): Payer: Medicare Other | Admitting: Family Medicine

## 2012-11-30 VITALS — BP 124/66 | HR 66 | Temp 98.1°F | Wt 245.0 lb

## 2012-11-30 DIAGNOSIS — E669 Obesity, unspecified: Secondary | ICD-10-CM | POA: Insufficient documentation

## 2012-11-30 DIAGNOSIS — E785 Hyperlipidemia, unspecified: Secondary | ICD-10-CM

## 2012-11-30 DIAGNOSIS — Z23 Encounter for immunization: Secondary | ICD-10-CM

## 2012-11-30 DIAGNOSIS — M1990 Unspecified osteoarthritis, unspecified site: Secondary | ICD-10-CM

## 2012-11-30 DIAGNOSIS — F329 Major depressive disorder, single episode, unspecified: Secondary | ICD-10-CM

## 2012-11-30 LAB — HEPATIC FUNCTION PANEL
Albumin: 3.9 g/dL (ref 3.5–5.2)
Alkaline Phosphatase: 55 U/L (ref 39–117)
Bilirubin, Direct: 0 mg/dL (ref 0.0–0.3)

## 2012-11-30 LAB — LIPID PANEL
LDL Cholesterol: 79 mg/dL (ref 0–99)
Total CHOL/HDL Ratio: 3
VLDL: 16.8 mg/dL (ref 0.0–40.0)

## 2012-11-30 LAB — BASIC METABOLIC PANEL
BUN: 20 mg/dL (ref 6–23)
CO2: 28 mEq/L (ref 19–32)
Chloride: 104 mEq/L (ref 96–112)
Creatinine, Ser: 1 mg/dL (ref 0.4–1.2)

## 2012-11-30 MED ORDER — ALPRAZOLAM 1 MG PO TABS: 1.0000 mg | ORAL_TABLET | Freq: Two times a day (BID) | ORAL | Status: DC

## 2012-11-30 MED ORDER — OXYCODONE HCL 5 MG PO TABS: 5.0000 mg | ORAL_TABLET | ORAL | Status: DC | PRN

## 2012-11-30 NOTE — Progress Notes (Signed)
Subjective:    Patient ID: [name], female    DOB: [birth year], 67 y.o.   MRN: [id number]  HPI Patient seen for medical followup. Her chronic problems include history of osteoarthritis, obesity, past history of non-Hodgkin's Lymphoma, remote history of DVT, recurrent depression/anxiety, cardiomyopathy, CAD. She quit smoking back in 2010.  She rarely takes oxycodone for severe osteoarthritis. Requesting refill. Generally takes about 30 tablets over 6 months. She sometimes takes for severe pain at night.  She is maintained on low-dose lisinopril. Respiratory status stable. No recent chest pains. She remains on Coumadin and that is being monitored through Coumadin clinic.  Anxiety and depression are stable. Requesting refills of Xanax. She's been on this for many years. Her depression is stable on Celexa.  Past Medical History  Diagnosis Date  . DYSLIPIDEMIA 02/27/2009  . DEPRESSION 06/13/2008  . ALLERGIC RHINITIS 08/01/2009  . ABDOMINAL PAIN RIGHT UPPER QUADRANT 11/08/2008  . CARDIOVASCULAR FUNCTION STUDY, ABNORMAL 04/04/2009  . NON-HODGKIN'S LYMPHOMA, HX OF dx  2005--  chemoradiation completed 2006--  no recurrence    onocologist- dr odogwu--   . OSTEOARTHRITIS, MODERATE 04/07/2010  . CAD, NATIVE VESSEL 05/30/2009    cardiologist- dr Shirlee Latch- visit 04-13-2010 in epic-- denies cardiac symptoms  . DYSPNEA 02/27/2009  . Retroperitoneal fibrosis   . Frequency of urination   . History of Lyme disease   . History of Bell's palsy   . DDD (degenerative disc disease), lumbar   . GERD (gastroesophageal reflux disease)   . Anxiety   . 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 History of DVT of lower extremity 2006-- CHRONIC COUMADIN THERAPY  . Anticoagulated on Coumadin    Past Surgical History  Procedure Laterality Date  . Multiple cysto/ left ureteral stent exchanges  LAST ONE 10-28-10  . Total knee arthroplasty  OCT 2010    RIGHT  . Total knee  arthroplasty  JAN 2010    LEFT  . Knee arthroscopy  2005    RIGHT  . Breast mass excision      BENIGN-- RIGHT BREAST  . Retroperitoneal bx  2007  . Exploratory laparotomy  2005    LYMPHOMA  . Tonsillectomy  CHILD  . Cardiovascular stress test  04-04-2009-- PER PT ASYMPTOMATIC-- MEDICAL MANAGEMENT    STUDY WAS EQUIVOCAL/ EF 49%/ GLOBAL HYPOKINESIS/ APPEARS TO BE A MILD ANTERIOR PERFUSION DEFECT COULD REPRESENT ISCHEMIA BUT DUE TO  ACTIVITY WORSE IN STRESS THAN AT REST POSS. THE DEFECT WAS ARTIFACTUAL  . Transthoracic echocardiogram  05-03-2009    MILD SYSTOLIC DYSFUNCTION/ EF 45-50%/ MODERATE DIASTOLIC DYSFUCTION/ MILD MR/ MILD BIATRIAL ENLARGEMENT  . Ct angiogram  FEB 2011    CALCIUM SCORE 161/ POSS. MODERATE MID LAD STENOSIS  . Cystoscopy w/ ureteral stent removal  04/28/2011    Procedure: CYSTOSCOPY WITH STENT REMOVAL;  Surgeon: Antony Haste, MD;  Location: [facility];  Service: Urology;;  . Cystoscopy w/ retrogrades  04/28/2011    Procedure: CYSTOSCOPY WITH RETROGRADE PYELOGRAM;  Surgeon: Antony Haste, MD;  Location: [facility];  Service: Urology;  Laterality: Left;    reports that she quit smoking about 4 years ago. Her smoking use included Cigarettes. She has a 104 pack-year smoking history. She has never used smokeless tobacco. She reports that  drinks alcohol. She reports that she does not use illicit drugs. family history includes Arthritis in her mother; Diabetes in her sister; Heart disease in her mother; Hypertension in her sister; Stroke in her sister.  Allergies  Allergen Reactions  . Chlorhexidine Base Itching    CHG WIPES  . Penicillins Hives      Review of Systems  Constitutional: Negative for fatigue.  Eyes: Negative for visual disturbance.  Respiratory: Negative for cough, chest tightness, shortness of breath and wheezing.   Cardiovascular: Negative for chest pain, palpitations and leg swelling.  Musculoskeletal:  Positive for arthralgias.  Neurological: Negative for dizziness, seizures, syncope, weakness, light-headedness and headaches.       Objective:   Physical Exam  Constitutional: She is oriented to person, place, and time. She appears well-developed and well-nourished.  Neck: Neck supple. No thyromegaly present.  Cardiovascular: Normal rate and regular rhythm.   Pulmonary/Chest: Effort normal and breath sounds normal. No respiratory distress. She has no wheezes. She has no rales.  Musculoskeletal: She exhibits no edema.  Neurological: She is alert and oriented to person, place, and time. No cranial nerve deficit.  Psychiatric: She has a normal mood and affect. Her behavior is normal.          Assessment & Plan:  #1 osteoarthritis. Avoid nonsteroidals. We gave 1 refill oxycodone #30 tablets to use sparingly. #2 history of recurrent depression/anxiety. Refill alprazolam. Continue current antidepressant regimen which seems to be controlling her depression well #3 health maintenance. Flu vaccine given. Pneumovax up-to-date. #4 hyperlipidemia. Recheck lipid and hepatic panel

## 2012-12-17 ENCOUNTER — Other Ambulatory Visit: Payer: Self-pay | Admitting: Cardiology

## 2012-12-20 ENCOUNTER — Other Ambulatory Visit: Payer: Self-pay | Admitting: Family Medicine

## 2012-12-20 ENCOUNTER — Telehealth: Payer: Self-pay | Admitting: Family Medicine

## 2012-12-20 NOTE — Telephone Encounter (Signed)
Patient Information:  Caller Name: [name]  Phone: [phone]  Patient: [name], [name]  Gender: Female  DOB: 12/16/1945  Age: 67 Years  PCP: Evelena Peat (Family Practice)  Office Follow Up:  Does the office need to follow up with this patient?: No  Instructions For The Office: N/A  RN Note:  Pt developed tenderness w/ swelling under Left Breast on 9-29, Hx of non-Hodgkins Lymphoma of Blood clots.  Pt denies Breast pain or discharge.  Pt has appt on 10-2 prior to call, first available appt scheduled w/ office per Pt.  Advised Pt to be seen at ED d/t history of Blood clots and non-Hodgins Lympnoma.  Pt verbalized understanding. Pt will go to closest ED, [place].  Symptoms  Reason For Call & Symptoms: Tenderness under Left Breast  Reviewed Health History In EMR: Yes  Reviewed Medications In EMR: Yes  Reviewed Allergies In EMR: Yes  Reviewed Surgeries / Procedures: Yes  Date of Onset of Symptoms: 12/19/2012  Guideline(s) Used:  Chest Pain  Disposition Per Guideline:   Go to ED Now  Reason For Disposition Reached:   History of prior "blood clot" in leg or lungs (i.e., deep vein thrombosis, pulmonary embolism)  Advice Given:  N/A  Patient Will Follow Care Advice:  YES

## 2012-12-21 ENCOUNTER — Telehealth: Payer: Self-pay | Admitting: Pharmacist

## 2012-12-21 NOTE — Telephone Encounter (Signed)
Patient called to ask to change her coumadin clinic visit coming up next week. She is unable to make next weeks appointment and just recently had her INR checked in the ED this week and she was within goal range at 2.4. We will reschedule her visit for 01/17/13 lab at 2:00pm and coumadin clinic at 2:15pm. Pt reports no complaints or issues at this time.   Christell Faith, PharmD

## 2012-12-22 ENCOUNTER — Ambulatory Visit (INDEPENDENT_AMBULATORY_CARE_PROVIDER_SITE_OTHER): Payer: Medicare Other | Admitting: Family Medicine

## 2012-12-22 ENCOUNTER — Encounter: Payer: Self-pay | Admitting: Family Medicine

## 2012-12-22 ENCOUNTER — Telehealth: Payer: Self-pay | Admitting: Family Medicine

## 2012-12-22 VITALS — BP 128/78 | HR 77 | Temp 98.2°F | Wt 248.0 lb

## 2012-12-22 DIAGNOSIS — B029 Zoster without complications: Secondary | ICD-10-CM

## 2012-12-22 MED ORDER — OXYCODONE HCL 5 MG PO TABS: 5.0000 mg | ORAL_TABLET | ORAL | Status: DC | PRN

## 2012-12-22 MED ORDER — VALACYCLOVIR HCL 1 G PO TABS: 1000.0000 mg | ORAL_TABLET | Freq: Three times a day (TID) | ORAL | Status: DC

## 2012-12-22 NOTE — Progress Notes (Signed)
Subjective:    Patient ID: [name], female    DOB: 06/09/1945, 67 y.o.   MRN: [id number]  HPI  Patient seen with left upper quadrant abdominal pain radiating to her back Onset this past Monday. She went to local emergency department Tuesday. She states she had blood work and some type of x-rays which did not reveal any abnormality Location is under left breast. She describes achy pain. Denies recent injury. No dyspnea. No cough. No fevers or chills. She has not noted any rash. No nausea or vomiting. No recent appetite or weight changes. She does have past history of lymphoma  Past Medical History  Diagnosis Date  . DYSLIPIDEMIA 02/27/2009  . DEPRESSION 06/13/2008  . ALLERGIC RHINITIS 08/01/2009  . ABDOMINAL PAIN RIGHT UPPER QUADRANT 11/08/2008  . CARDIOVASCULAR FUNCTION STUDY, ABNORMAL 04/04/2009  . NON-HODGKIN'S LYMPHOMA, HX OF dx  2005--  chemoradiation completed 2006--  no recurrence    onocologist- dr odogwu--   . OSTEOARTHRITIS, MODERATE 04/07/2010  . CAD, NATIVE VESSEL 05/30/2009    cardiologist- dr Shirlee Latch- visit 04-13-2010 in epic-- denies cardiac symptoms  . DYSPNEA 02/27/2009  . Retroperitoneal fibrosis   . Frequency of urination   . History of Lyme disease   . History of Bell's palsy   . DDD (degenerative disc disease), lumbar   . GERD (gastroesophageal reflux disease)   . Anxiety   . 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 History of DVT of lower extremity 2006-- CHRONIC COUMADIN THERAPY  . Anticoagulated on Coumadin    Past Surgical History  Procedure Laterality Date  . Multiple cysto/ left ureteral stent exchanges  LAST ONE 10-28-10  . Total knee arthroplasty  OCT 2010    RIGHT  . Total knee arthroplasty  JAN 2010    LEFT  . Knee arthroscopy  2005    RIGHT  . Breast mass excision      BENIGN-- RIGHT BREAST  . Retroperitoneal bx  2007  . Exploratory laparotomy  2005    LYMPHOMA  . Tonsillectomy  CHILD  . Cardiovascular  stress test  04-04-2009-- PER PT ASYMPTOMATIC-- MEDICAL MANAGEMENT    STUDY WAS EQUIVOCAL/ EF 49%/ GLOBAL HYPOKINESIS/ APPEARS TO BE A MILD ANTERIOR PERFUSION DEFECT COULD REPRESENT ISCHEMIA BUT DUE TO  ACTIVITY WORSE IN STRESS THAN AT REST POSS. THE DEFECT WAS ARTIFACTUAL  . Transthoracic echocardiogram  05-03-2009    MILD SYSTOLIC DYSFUNCTION/ EF 45-50%/ MODERATE DIASTOLIC DYSFUCTION/ MILD MR/ MILD BIATRIAL ENLARGEMENT  . Ct angiogram  FEB 2011    CALCIUM SCORE 161/ POSS. MODERATE MID LAD STENOSIS  . Cystoscopy w/ ureteral stent removal  04/28/2011    Procedure: CYSTOSCOPY WITH STENT REMOVAL;  Surgeon: Antony Haste, MD;  Location: [facility];  Service: Urology;;  . Cystoscopy w/ retrogrades  04/28/2011    Procedure: CYSTOSCOPY WITH RETROGRADE PYELOGRAM;  Surgeon: Antony Haste, MD;  Location: [facility];  Service: Urology;  Laterality: Left;    reports that she quit smoking about 4 years ago. Her smoking use included Cigarettes. She has a 104 pack-year smoking history. She has never used smokeless tobacco. She reports that  drinks alcohol. She reports that she does not use illicit drugs. family history includes Arthritis in her mother; Diabetes in her sister; Heart disease in her mother; Hypertension in her sister; Stroke in her sister. Allergies  Allergen Reactions  . Chlorhexidine Base Itching    CHG WIPES  . Penicillins Hives  Review of Systems  Constitutional: Negative for fever, chills and fatigue.  Respiratory: Negative for shortness of breath.   Cardiovascular: Negative for chest pain.  Gastrointestinal: Positive for abdominal pain.       Objective:   Physical Exam  Constitutional: She appears well-developed and well-nourished.  Cardiovascular: Normal rate and regular rhythm.   Pulmonary/Chest: Effort normal and breath sounds normal. No respiratory distress. She has no wheezes. She has no rales.  Abdominal: Soft. She  exhibits no mass. There is tenderness. There is no rebound and no guarding.  Mildly tender to palpation left upper quadrant  Skin: Rash noted.  Patient has slightly raised vesicular rash with erythematous base with several different patches in dermatome distribution left thoracic region          Assessment & Plan:  Shingles. Valtrex 1 g 3 times a day for 7 days. Refill oxycodone 5 mg for as needed use. Handout given.

## 2012-12-22 NOTE — Patient Instructions (Signed)
Shingles Shingles (herpes zoster) is an infection that is caused by the same virus that causes chickenpox (varicella). The infection causes a painful skin rash and fluid-filled blisters, which eventually break open, crust over, and heal. It may occur in any area of the body, but it usually affects only one side of the body or face. The pain of shingles usually lasts about 1 month. However, some people with shingles may develop long-term (chronic) pain in the affected area of the body. Shingles often occurs many years after the person had chickenpox. It is more common:  In people older than 50 years.  In people with weakened immune systems, such as those with HIV, AIDS, or cancer.  In people taking medicines that weaken the immune system, such as transplant medicines.  In people under great stress. CAUSES  Shingles is caused by the varicella zoster virus (VZV), which also causes chickenpox. After a person is infected with the virus, it can remain in the person's body for years in an inactive state (dormant). To cause shingles, the virus reactivates and breaks out as an infection in a nerve root. The virus can be spread from person to person (contagious) through contact with open blisters of the shingles rash. It will only spread to people who have not had chickenpox. When these people are exposed to the virus, they may develop chickenpox. They will not develop shingles. Once the blisters scab over, the person is no longer contagious and cannot spread the virus to others. SYMPTOMS  Shingles shows up in stages. The initial symptoms may be pain, itching, and tingling in an area of the skin. This pain is usually described as burning, stabbing, or throbbing.In a few days or weeks, a painful red rash will appear in the area where the pain, itching, and tingling were felt. The rash is usually on one side of the body in a band or belt-like pattern. Then, the rash usually turns into fluid-filled blisters. They  will scab over and dry up in approximately 2 3 weeks. Flu-like symptoms may also occur with the initial symptoms, the rash, or the blisters. These may include:  Fever.  Chills.  Headache.  Upset stomach. DIAGNOSIS  Your caregiver will perform a skin exam to diagnose shingles. Skin scrapings or fluid samples may also be taken from the blisters. This sample will be examined under a microscope or sent to a lab for further testing. TREATMENT  There is no specific cure for shingles. Your caregiver will likely prescribe medicines to help you manage the pain, recover faster, and avoid long-term problems. This may include antiviral drugs, anti-inflammatory drugs, and pain medicines. HOME CARE INSTRUCTIONS   Take a cool bath or apply cool compresses to the area of the rash or blisters as directed. This may help with the pain and itching.   Only take over-the-counter or prescription medicines as directed by your caregiver.   Rest as directed by your caregiver.  Keep your rash and blisters clean with mild soap and cool water or as directed by your caregiver.  Do not pick your blisters or scratch your rash. Apply an anti-itch cream or numbing creams to the affected area as directed by your caregiver.  Keep your shingles rash covered with a loose bandage (dressing).  Avoid skin contact with:  Babies.   Pregnant women.   Children with eczema.   Elderly people with transplants.   People with chronic illnesses, such as leukemia or AIDS.   Wear loose-fitting clothing to help ease   the pain of material rubbing against the rash.  Keep all follow-up appointments with your caregiver.If the area involved is on your face, you may receive a referral for follow-up to a specialist, such as an eye doctor (ophthalmologist) or an ear, nose, and throat (ENT) doctor. Keeping all follow-up appointments will help you avoid eye complications, chronic pain, or disability.  SEEK IMMEDIATE MEDICAL  CARE IF:   You have facial pain, pain around the eye area, or loss of feeling on one side of your face.  You have ear pain or ringing in your ear.  You have loss of taste.  Your pain is not relieved with prescribed medicines.   Your redness or swelling spreads.   You have more pain and swelling.  Your condition is worsening or has changed.   You have a feveror persistent symptoms for more than 2 3 days.  You have a fever and your symptoms suddenly get worse. MAKE SURE YOU:  Understand these instructions.  Will watch your condition.  Will get help right away if you are not doing well or get worse. Document Released: 03/09/2005 Document Revised: 12/02/2011 Document Reviewed: 10/22/2011 Exit[employer] Patient Information 2014 [employer].  

## 2012-12-22 NOTE — Telephone Encounter (Signed)
Health Education given to pt regarding contagiousness of shingels. No further questons.

## 2012-12-26 ENCOUNTER — Other Ambulatory Visit: Payer: Self-pay | Admitting: Cardiology

## 2012-12-26 ENCOUNTER — Other Ambulatory Visit: Payer: Medicare Other | Admitting: Lab

## 2012-12-26 ENCOUNTER — Telehealth: Payer: Self-pay | Admitting: Pharmacist

## 2012-12-26 ENCOUNTER — Ambulatory Visit: Payer: Medicare Other

## 2012-12-26 DIAGNOSIS — I82402 Acute embolism and thrombosis of unspecified deep veins of left lower extremity: Secondary | ICD-10-CM

## 2012-12-26 MED ORDER — WARFARIN SODIUM 5 MG PO TABS: ORAL_TABLET | ORAL | Status: DC

## 2012-12-27 ENCOUNTER — Other Ambulatory Visit: Payer: Medicare Other | Admitting: Lab

## 2012-12-27 ENCOUNTER — Ambulatory Visit: Payer: Medicare Other

## 2012-12-29 ENCOUNTER — Other Ambulatory Visit: Payer: Self-pay | Admitting: Family Medicine

## 2013-01-06 ENCOUNTER — Encounter: Payer: Self-pay | Admitting: Family Medicine

## 2013-01-17 ENCOUNTER — Other Ambulatory Visit: Payer: Self-pay | Admitting: Pharmacist

## 2013-01-17 ENCOUNTER — Ambulatory Visit (HOSPITAL_BASED_OUTPATIENT_CLINIC_OR_DEPARTMENT_OTHER): Payer: Self-pay | Admitting: Pharmacist

## 2013-01-17 ENCOUNTER — Other Ambulatory Visit (HOSPITAL_BASED_OUTPATIENT_CLINIC_OR_DEPARTMENT_OTHER): Payer: Medicare Other | Admitting: Lab

## 2013-01-17 DIAGNOSIS — I824Z9 Acute embolism and thrombosis of unspecified deep veins of unspecified distal lower extremity: Secondary | ICD-10-CM

## 2013-01-17 DIAGNOSIS — I82409 Acute embolism and thrombosis of unspecified deep veins of unspecified lower extremity: Secondary | ICD-10-CM

## 2013-01-17 LAB — POCT INR: INR: 3.4

## 2013-01-17 LAB — PROTIME-INR: INR: 3.4 (ref 2.00–3.50)

## 2013-01-17 NOTE — Progress Notes (Signed)
No changes to report Med list checked. INR=3.4 on coumadin 5mg  daily and 7.5 mg on MWF No s/s of bleeding. She has been on this weekly dose for quite some time with fluctuations in INR Continue taking Coumadin 5 mg daily except for 7.5 mg on Monday, Wednesday, and Friday.  Recheck INR in about 6 weeks on 02/21/13; lab at 2:00pm and Coumadin clinic at 2:15pm

## 2013-01-17 NOTE — Patient Instructions (Signed)
Continue taking Coumadin 5 mg daily except for 7.5 mg on Monday, Wednesday, and Friday. Recheck INR in about 6 weeks on 02/21/13; lab at 2:00pm and Coumadin clinic at 2:15pm

## 2013-01-22 ENCOUNTER — Other Ambulatory Visit: Payer: Self-pay | Admitting: Cardiology

## 2013-01-23 ENCOUNTER — Other Ambulatory Visit: Payer: Self-pay

## 2013-02-21 ENCOUNTER — Other Ambulatory Visit (HOSPITAL_BASED_OUTPATIENT_CLINIC_OR_DEPARTMENT_OTHER): Payer: Medicare Other | Admitting: Lab

## 2013-02-21 ENCOUNTER — Ambulatory Visit (HOSPITAL_BASED_OUTPATIENT_CLINIC_OR_DEPARTMENT_OTHER): Payer: Self-pay | Admitting: Pharmacist

## 2013-02-21 DIAGNOSIS — I82409 Acute embolism and thrombosis of unspecified deep veins of unspecified lower extremity: Secondary | ICD-10-CM

## 2013-02-21 DIAGNOSIS — I824Z9 Acute embolism and thrombosis of unspecified deep veins of unspecified distal lower extremity: Secondary | ICD-10-CM

## 2013-02-21 LAB — POCT INR: INR: 2.3

## 2013-02-21 LAB — PROTIME-INR
INR: 2.3 (ref 2.00–3.50)
Protime: 27.6 Seconds — ABNORMAL HIGH (ref 10.6–13.4)

## 2013-02-21 NOTE — Progress Notes (Signed)
Pt seen in clinic today. INR=2.3 No changes to report No s/s of bleeding or clotting.  Continue taking Coumadin 5 mg daily except for 7.5 mg on Monday, Wednesday, and Friday.  Recheck INR in about 6 weeks on 03/31/13 lab at 1:30pm, Coumadin clinic at 1:45pm and provider appmt at 2:00.

## 2013-02-21 NOTE — Patient Instructions (Signed)
Continue taking Coumadin 5 mg daily except for 7.5 mg on Monday, Wednesday, and Friday. Recheck INR in about 6 weeks on 03/31/13; lab at 1:30pm, Coumadin clinic at 1:45pm and provider appmt at 2:00.

## 2013-02-23 ENCOUNTER — Other Ambulatory Visit: Payer: Self-pay | Admitting: Cardiology

## 2013-02-23 ENCOUNTER — Other Ambulatory Visit: Payer: Self-pay | Admitting: *Deleted

## 2013-02-23 DIAGNOSIS — Z87898 Personal history of other specified conditions: Secondary | ICD-10-CM

## 2013-02-23 MED ORDER — WARFARIN SODIUM 2.5 MG PO TABS: ORAL_TABLET | ORAL | Status: DC

## 2013-02-27 ENCOUNTER — Other Ambulatory Visit: Payer: Self-pay | Admitting: Cardiology

## 2013-03-02 ENCOUNTER — Other Ambulatory Visit: Payer: Self-pay | Admitting: Family Medicine

## 2013-03-03 ENCOUNTER — Other Ambulatory Visit: Payer: Self-pay

## 2013-03-03 ENCOUNTER — Telehealth: Payer: Self-pay | Admitting: *Deleted

## 2013-03-03 MED ORDER — LISINOPRIL 5 MG PO TABS: ORAL_TABLET | ORAL | Status: DC

## 2013-03-03 NOTE — Telephone Encounter (Signed)
Lm informed the pt that Dr. Rosie Fate is on call 03/31/13 and would like for her to come in early for her appts. gv appts for 03/31/13 w/ labs@ 11:15am, coa@ 11:30am, and ov@ 12 noon. Pt is aware that i will mail a letter/avs..td

## 2013-03-08 ENCOUNTER — Ambulatory Visit (INDEPENDENT_AMBULATORY_CARE_PROVIDER_SITE_OTHER): Payer: Medicare Other | Admitting: Nurse Practitioner

## 2013-03-08 ENCOUNTER — Encounter: Payer: Self-pay | Admitting: Nurse Practitioner

## 2013-03-08 ENCOUNTER — Encounter: Payer: Self-pay | Admitting: Cardiology

## 2013-03-08 VITALS — BP 110/70 | HR 78 | Ht 66.0 in | Wt 250.1 lb

## 2013-03-08 DIAGNOSIS — I429 Cardiomyopathy, unspecified: Secondary | ICD-10-CM

## 2013-03-08 DIAGNOSIS — I428 Other cardiomyopathies: Secondary | ICD-10-CM

## 2013-03-08 DIAGNOSIS — I480 Paroxysmal atrial fibrillation: Secondary | ICD-10-CM

## 2013-03-08 DIAGNOSIS — I4891 Unspecified atrial fibrillation: Secondary | ICD-10-CM

## 2013-03-08 MED ORDER — LISINOPRIL 5 MG PO TABS: ORAL_TABLET | ORAL | Status: DC

## 2013-03-08 MED ORDER — PITAVASTATIN CALCIUM 2 MG PO TABS: ORAL_TABLET | ORAL | Status: DC

## 2013-03-08 NOTE — Patient Instructions (Signed)
Stay on your current medicines  I have refilled the Livelo and Lisinopril today  We are going to repeat your ultrasound of your heart  See Dr. Shirlee Latch in 6 months  Call the [facility] office at [phone] if you have any questions, problems or concerns.

## 2013-03-08 NOTE — Progress Notes (Addendum)
[name] Date of Birth: [birth year] Medical Record #[id number]  History of Present Illness: [name] is seen back today for a work in visit. Seen for Dr. Shirlee Latch. She has a history of retroperitoneal fibrosis and non-Hodgkin's lymphoma as well as prior DVT. Had an abnormal Myoview back in January of 2011 showing an equivocal study with subdiaphragmatic nuclear uptake. EF 49% with global hypokinesis - ended up having a coronary CT angio showing a moderate calcium score of 161 (putting her at a high risk percentile for her age) and possible moderate mild LAD stenosis (though some degradation of images by respiratory artifact) - then had echo which confirming mid LV systolic dysfunctin, EF 45 to 47% with moderate diastolic dysfunction. Cardiac cath recommended but she opted for medical therapy.   Her last visit here was back in June of 2013. This was after a year and a half absence.   Comes in today. Here alone. Reports trouble trying to get an appointment. Was having issues back in August with "flips/skips". Called for an appointment and was told no availability until October and no availability with a NP/PA until September. She ended up going to ER in [place] and [facility] - had a monitor - had PAF noted - started on Diltiazem. Remains on her coumadin. Echo was done - moderate MS noted with EF of 55%.   Clinically she is now doing ok. No chest pain. Not short of breath unless she overexerts - and she avoids overexertion. Tolerating her medicines. Coumadin checked at the cancer center. Some occasional palpitations but has improved with her current medicines.    Current Outpatient Prescriptions  Medication Sig Dispense Refill  . ALPRAZolam (XANAX) 1 MG tablet Take 1 tablet (1 mg total) by mouth 2 (two) times daily.  60 tablet  5  . beta carotene w/minerals (OCUVITE) tablet Take 1 tablet by mouth daily.      Marland Kitchen buPROPion (WELLBUTRIN XL) 300 MG 24 hr tablet take 1 tablet  by mouth once daily  90 tablet  3  . chlorhexidine (PERIDEX) 0.12 % solution 5 mLs by Mouth Rinse route Twice daily.      . cholecalciferol (VITAMIN D) 400 UNITS TABS Take 400 Units by mouth daily.       . citalopram (CELEXA) 40 MG tablet take 1 tablet by mouth once daily  90 tablet  3  . co-enzyme Q-10 30 MG capsule Take 30 mg by mouth daily.       Marland Kitchen diltiazem (CARDIZEM CD) 180 MG 24 hr capsule Take 180 mg by mouth daily.      . fluticasone (FLONASE) 50 MCG/ACT nasal spray instill 2 sprays into each nostril once daily  16 g  6  . lisinopril (PRINIVIL,ZESTRIL) 5 MG tablet take 1 tablet by mouth once daily  5 tablet  0  . LIVALO 2 MG TABS take 1 tablet by mouth once daily  15 tablet  0  . loratadine (CLARITIN) 10 MG tablet Take 10 mg by mouth daily.       Marland Kitchen NITROSTAT 0.4 MG SL tablet PLACE 1 TAB UNDER TONGUE.Marland Kitchen TAKE TABS 5 MINS APART.. UP TO 3 TABS  25 each  12  . omeprazole (PRILOSEC) 20 MG capsule Take 20 mg by mouth every morning.      Marland Kitchen oxyCODONE (OXY IR/ROXICODONE) 5 MG immediate release tablet Take 1 tablet (5 mg total) by mouth every 4 (four) hours as needed for pain.  30 tablet  0  . oxyCODONE (  OXY IR/ROXICODONE) 5 MG immediate release tablet Take 1 tablet (5 mg total) by mouth every 4 (four) hours as needed for pain.  30 tablet  0  . valACYclovir (VALTREX) 1000 MG tablet Take 1 tablet (1,000 mg total) by mouth 3 (three) times daily.  21 tablet  0  . warfarin (COUMADIN) 2.5 MG tablet TAKE AS DIRECTED ON Monday, Wednesday , AND Friday.  45 tablet  0  . warfarin (COUMADIN) 5 MG tablet Take 5 mg daily except 7.5 mg on MWF or as directed  90 tablet  3   No current facility-administered medications for this visit.    Allergies  Allergen Reactions  . Chlorhexidine Base Itching    CHG WIPES  . Penicillins Hives    Past Medical History  Diagnosis Date  . DYSLIPIDEMIA 02/27/2009  . DEPRESSION 06/13/2008  . ALLERGIC RHINITIS 08/01/2009  . ABDOMINAL PAIN RIGHT UPPER QUADRANT 11/08/2008  .  CARDIOVASCULAR FUNCTION STUDY, ABNORMAL 04/04/2009  . NON-HODGKIN'S LYMPHOMA, HX OF dx  2005--  chemoradiation completed 2006--  no recurrence    onocologist- dr odogwu--   . OSTEOARTHRITIS, MODERATE 04/07/2010  . CAD, NATIVE VESSEL 05/30/2009    cardiologist- dr Shirlee Latch- visit 04-13-2010 in epic-- denies cardiac symptoms  . DYSPNEA 02/27/2009  . Retroperitoneal fibrosis   . Frequency of urination   . History of Lyme disease   . History of Bell's palsy   . DDD (degenerative disc disease), lumbar   . GERD (gastroesophageal reflux disease)   . Anxiety   . 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 History of DVT of lower extremity 2006-- CHRONIC COUMADIN THERAPY  . Anticoagulated on Coumadin     Past Surgical History  Procedure Laterality Date  . Multiple cysto/ left ureteral stent exchanges  LAST ONE 10-28-10  . Total knee arthroplasty  OCT 2010    RIGHT  . Total knee arthroplasty  JAN 2010    LEFT  . Knee arthroscopy  2005    RIGHT  . Breast mass excision      BENIGN-- RIGHT BREAST  . Retroperitoneal bx  2007  . Exploratory laparotomy  2005    LYMPHOMA  . Tonsillectomy  CHILD  . Cardiovascular stress test  04-04-2009-- PER PT ASYMPTOMATIC-- MEDICAL MANAGEMENT    STUDY WAS EQUIVOCAL/ EF 49%/ GLOBAL HYPOKINESIS/ APPEARS TO BE A MILD ANTERIOR PERFUSION DEFECT COULD REPRESENT ISCHEMIA BUT DUE TO  ACTIVITY WORSE IN STRESS THAN AT REST POSS. THE DEFECT WAS ARTIFACTUAL  . Transthoracic echocardiogram  05-03-2009    MILD SYSTOLIC DYSFUNCTION/ EF 45-50%/ MODERATE DIASTOLIC DYSFUCTION/ MILD MR/ MILD BIATRIAL ENLARGEMENT  . Ct angiogram  FEB 2011    CALCIUM SCORE 161/ POSS. MODERATE MID LAD STENOSIS  . Cystoscopy w/ ureteral stent removal  04/28/2011    Procedure: CYSTOSCOPY WITH STENT REMOVAL;  Surgeon: Antony Haste, MD;  Location: [facility];  Service: Urology;;  . Cystoscopy w/ retrogrades  04/28/2011    Procedure: CYSTOSCOPY WITH  RETROGRADE PYELOGRAM;  Surgeon: Antony Haste, MD;  Location: [facility];  Service: Urology;  Laterality: Left;    History  Smoking status  . Former Smoker -- 2.00 packs/day for 52 years  . Types: Cigarettes  . Quit date: 03/24/2008  Smokeless tobacco  . Never Used    History  Alcohol Use  . Yes    Comment: RARE    Family History  Problem Relation Age of Onset  . Arthritis Mother   . Heart  disease Mother   . Diabetes Sister   . Hypertension Sister   . Stroke Sister     Review of Systems: The review of systems is per the HPI.  All other systems were reviewed and are negative.  Physical Exam: BP 110/70  Pulse 78  Ht 5\' 6"  (1.676 m)  Wt 250 lb 1.9 oz (113.454 kg)  BMI 40.39 kg/m2  SpO2 94% Patient is very pleasant and in no acute distress. She is morbidly obese. Skin is warm and dry. Color is normal.  HEENT is unremarkable. Normocephalic/atraumatic. PERRL. Sclera are nonicteric. Neck is supple. No masses. No JVD. Lungs are clear. Cardiac exam shows a regular rate and rhythm. No real murmur that I can appreciate. Abdomen is soft. Extremities are fleshy but without edema. Gait and ROM are intact. No gross neurologic deficits noted.  Wt Readings from Last 3 Encounters:  03/08/13 250 lb 1.9 oz (113.454 kg)  12/22/12 248 lb (112.492 kg)  11/30/12 245 lb (111.131 kg)     LABORATORY DATA:  Lab Results  Component Value Date   WBC 4.8 10/07/2012   HGB 12.0 10/07/2012   HCT 36.0 10/07/2012   PLT 173 10/07/2012   GLUCOSE 98 11/30/2012   CHOL 152 11/30/2012   TRIG 84.0 11/30/2012   HDL 56.20 11/30/2012   LDLCALC 79 11/30/2012   ALT 17 11/30/2012   AST 18 11/30/2012   NA 138 11/30/2012   K 4.8 11/30/2012   CL 104 11/30/2012   CREATININE 1.0 11/30/2012   BUN 20 11/30/2012   CO2 28 11/30/2012   TSH 1.57 01/08/2011   INR 2.3 02/21/2013     Assessment / Plan: 1. Diastolic dysfunction - managed medically.   2. Past abnormal Myoview/abnormal coronary CT  angio - has refused cath - managed medically - currently without any symptoms.  3. Obesity - long standing issue  4. PAF - remains on her coumadin - in sinus by EKG today.   5. HLD - on statin therapy. Labs per her PCP  6. Chronic coumadin  7. Moderate MS - echo shown to Dr. Shirlee Latch - he feels this was probably overread - would like to repeat.   Tentatively see her back in 6 months. No change in medicines. Refills given today.  Patient is agreeable to this plan and will call if any problems develop in the interim.   Rosalio Macadamia, RN, ANP-C [facility] [address] [place], [place]  [address]   Addendum: April 04, 2013 - Message from Dr. Shirlee Latch from billing regarding the echo - not able to be approved thru insurance.   Would let her know that her insurance refused a limited echo. Would try to do an echo in 1 year to reassess the mitral valve as long as her symptoms are stable.

## 2013-03-15 ENCOUNTER — Other Ambulatory Visit: Payer: Self-pay | Admitting: Family Medicine

## 2013-03-29 ENCOUNTER — Encounter: Payer: Self-pay | Admitting: *Deleted

## 2013-03-29 ENCOUNTER — Telehealth: Payer: Self-pay | Admitting: Family Medicine

## 2013-03-29 ENCOUNTER — Telehealth: Payer: Self-pay | Admitting: *Deleted

## 2013-03-29 NOTE — Telephone Encounter (Signed)
Patient Information:  Caller Name: Tazia  Phone: [phone]  Patient: Kaniya, Trueheart  Gender: Female  DOB: 21-Apr-1945  Age: 68 Years  PCP: Carolann Littler (Family Practice)  Office Follow Up:  Does the office need to follow up with this patient?: No  Instructions For The Office: N/A  RN Note:  Hawa developed cold symptoms on 03/07/13.  Cough is worsening and has severe coughing spells.  Afebrile.  Denies difficulty breathing or wheezing.  Symptoms  Reason For Call & Symptoms: cough  Reviewed Health History In EMR: Yes  Reviewed Medications In EMR: Yes  Reviewed Allergies In EMR: Yes  Reviewed Surgeries / Procedures: Yes  Date of Onset of Symptoms: 03/07/2013  Guideline(s) Used:  Cough  Disposition Per Guideline:   See Today in Office  Reason For Disposition Reached:   Severe coughing spells (e.g., whooping sound after coughing, vomiting after coughing)  Advice Given:  Call Back If:  Difficulty breathing  You become worse.  Patient Refused Recommendation:  Patient Refused Care Advice  Pt refused appt today and requested appt for tomorrow, 03/30/13.  Nurse encouraged appt for today but per pt request, scheduled appt for 03/30/13 at 1530 with Dr. Elease Hashimoto.

## 2013-03-29 NOTE — Telephone Encounter (Signed)
Noted  

## 2013-03-29 NOTE — Telephone Encounter (Signed)
Pt called to call her appts for 03/31/13 due to shes been sick since christmas...td

## 2013-03-30 ENCOUNTER — Encounter: Payer: Self-pay | Admitting: Family Medicine

## 2013-03-30 ENCOUNTER — Ambulatory Visit (INDEPENDENT_AMBULATORY_CARE_PROVIDER_SITE_OTHER): Payer: Medicare Other | Admitting: Family Medicine

## 2013-03-30 VITALS — BP 120/68 | HR 71 | Temp 97.6°F | Wt 246.0 lb

## 2013-03-30 DIAGNOSIS — J209 Acute bronchitis, unspecified: Secondary | ICD-10-CM

## 2013-03-30 MED ORDER — HYDROCODONE-HOMATROPINE 5-1.5 MG/5ML PO SYRP: 5.0000 mL | ORAL_SOLUTION | Freq: Four times a day (QID) | ORAL | Status: DC | PRN

## 2013-03-30 NOTE — Progress Notes (Signed)
Subjective:    Patient ID: [name], female    DOB: 1945-05-10, 68 y.o.   MRN: [id number]  HPI Patient three-week history of cough Mostly nonproductive. She initially had some sore throat and URI symptoms with nasal congestion and postnasal drip. She has never had any fever. No dyspnea. Cough has been severe at times. No obvious wheezing. She has not tried any over-the-counter cough medications. She denies any dyspnea. No pleuritic pain. No hemoptysis. Patient is a former smoker.  Past Medical History  Diagnosis Date  . DYSLIPIDEMIA 02/27/2009  . DEPRESSION 06/13/2008  . ALLERGIC RHINITIS 08/01/2009  . ABDOMINAL PAIN RIGHT UPPER QUADRANT 11/08/2008  . CARDIOVASCULAR FUNCTION STUDY, ABNORMAL 04/04/2009  . NON-HODGKIN'S LYMPHOMA, HX OF dx  2005--  chemoradiation completed 2006--  no recurrence    onocologist- dr odogwu--   . OSTEOARTHRITIS, MODERATE 04/07/2010  . CAD, NATIVE VESSEL 05/30/2009    cardiologist- dr Aundra Dubin- visit 04-13-2010 in epic-- denies cardiac symptoms  . DYSPNEA 02/27/2009  . Retroperitoneal fibrosis   . Frequency of urination   . History of Lyme disease   . History of Bell's palsy   . DDD (degenerative disc disease), lumbar   . GERD (gastroesophageal reflux disease)   . Anxiety   . 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 History of DVT of lower extremity 2006-- CHRONIC COUMADIN THERAPY  . Anticoagulated on Coumadin    Past Surgical History  Procedure Laterality Date  . Multiple cysto/ left ureteral stent exchanges  LAST ONE 10-28-10  . Total knee arthroplasty  OCT 2010    RIGHT  . Total knee arthroplasty  JAN 2010    LEFT  . Knee arthroscopy  2005    RIGHT  . Breast mass excision      BENIGN-- RIGHT BREAST  . Retroperitoneal bx  2007  . Exploratory laparotomy  2005    LYMPHOMA  . Tonsillectomy  CHILD  . Cardiovascular stress test  04-04-2009-- PER PT ASYMPTOMATIC-- MEDICAL MANAGEMENT    STUDY WAS EQUIVOCAL/ EF  49%/ GLOBAL HYPOKINESIS/ APPEARS TO BE A MILD ANTERIOR PERFUSION DEFECT COULD REPRESENT ISCHEMIA BUT DUE TO  ACTIVITY WORSE IN STRESS THAN AT REST POSS. THE DEFECT WAS ARTIFACTUAL  . Transthoracic echocardiogram  [id number]    MILD SYSTOLIC DYSFUNCTION/ EF 47-42%/ MODERATE DIASTOLIC DYSFUCTION/ MILD MR/ MILD BIATRIAL ENLARGEMENT  . Ct angiogram  FEB 2011    CALCIUM SCORE 161/ POSS. MODERATE MID LAD STENOSIS  . Cystoscopy w/ ureteral stent removal  04/28/2011    Procedure: CYSTOSCOPY WITH STENT REMOVAL;  Surgeon: Fredricka Bonine, MD;  Location: [facility];  Service: Urology;;  . Cystoscopy w/ retrogrades  04/28/2011    Procedure: CYSTOSCOPY WITH RETROGRADE PYELOGRAM;  Surgeon: Fredricka Bonine, MD;  Location: [facility];  Service: Urology;  Laterality: Left;    reports that she quit smoking about 5 years ago. Her smoking use included Cigarettes. She has a 104 pack-year smoking history. She has never used smokeless tobacco. She reports that she drinks alcohol. She reports that she does not use illicit drugs. family history includes Arthritis in her mother; Diabetes in her sister; Heart disease in her mother; Hypertension in her sister; Stroke in her sister. Allergies  Allergen Reactions  . Chlorhexidine Base Itching    CHG WIPES  . Penicillins Hives      Review of Systems  Constitutional: Positive for fatigue. Negative for fever and chills.  HENT: Positive for congestion. Negative for sore throat.  Respiratory: Positive for cough. Negative for shortness of breath and wheezing.        Objective:   Physical Exam  Constitutional: She appears well-developed and well-nourished.  HENT:  Right Ear: External ear normal.  Left Ear: External ear normal.  Nose: Nose normal.  Mouth/Throat: Oropharynx is clear and moist.  Neck: Neck supple.  Cardiovascular: Normal rate.   Pulmonary/Chest: Effort normal and breath sounds normal. No respiratory  distress. She has no wheezes. She has no rales.  Lymphadenopathy:    She has no cervical adenopathy.          Assessment & Plan:  Cough. Suspect acute viral bronchitis. Nonfocal exam. Hycodan cough syrup 1 teaspoon each bedtime for severe cough. Followup promptly for fever or worsening symptoms

## 2013-03-30 NOTE — Progress Notes (Signed)
Pre visit review using our clinic review tool, if applicable. No additional management support is needed unless otherwise documented below in the visit note. 

## 2013-03-30 NOTE — Patient Instructions (Signed)
Acute Bronchitis Bronchitis is inflammation of the airways that extend from the windpipe into the lungs (bronchi). The inflammation often causes mucus to develop. This leads to a cough, which is the most common symptom of bronchitis.  In acute bronchitis, the condition usually develops suddenly and goes away over time, usually in a couple weeks. Smoking, allergies, and asthma can make bronchitis worse. Repeated episodes of bronchitis may cause further lung problems.  CAUSES Acute bronchitis is most often caused by the same virus that causes a cold. The virus can spread from person to person (contagious).  SIGNS AND SYMPTOMS   Cough.   Fever.   Coughing up mucus.   Body aches.   Chest congestion.   Chills.   Shortness of breath.   Sore throat.  DIAGNOSIS  Acute bronchitis is usually diagnosed through a physical exam. Tests, such as chest X-rays, are sometimes done to rule out other conditions.  TREATMENT  Acute bronchitis usually goes away in a couple weeks. Often times, no medical treatment is necessary. Medicines are sometimes given for relief of fever or cough. Antibiotics are usually not needed but may be prescribed in certain situations. In some cases, an inhaler may be recommended to help reduce shortness of breath and control the cough. A cool mist vaporizer may also be used to help thin bronchial secretions and make it easier to clear the chest.  HOME CARE INSTRUCTIONS  Get plenty of rest.   Drink enough fluids to keep your urine clear or pale yellow (unless you have a medical condition that requires fluid restriction). Increasing fluids may help thin your secretions and will prevent dehydration.   Only take over-the-counter or prescription medicines as directed by your health care provider.   Avoid smoking and secondhand smoke. Exposure to cigarette smoke or irritating chemicals will make bronchitis worse. If you are a smoker, consider using nicotine gum or skin  patches to help control withdrawal symptoms. Quitting smoking will help your lungs heal faster.   Reduce the chances of another bout of acute bronchitis by washing your hands frequently, avoiding people with cold symptoms, and trying not to touch your hands to your mouth, nose, or eyes.   Follow up with your health care provider as directed.  SEEK MEDICAL CARE IF: Your symptoms do not improve after 1 week of treatment.  SEEK IMMEDIATE MEDICAL CARE IF:  You develop an increased fever or chills.   You have chest pain.   You have severe shortness of breath.  You have bloody sputum.   You develop dehydration.  You develop fainting.  You develop repeated vomiting.  You develop a severe headache. MAKE SURE YOU:   Understand these instructions.  Will watch your condition.  Will get help right away if you are not doing well or get worse. Document Released: 04/16/2004 Document Revised: 11/09/2012 Document Reviewed: 08/30/2012 Exit[employer] Patient Information 2014 [employer].  

## 2013-03-31 ENCOUNTER — Other Ambulatory Visit: Payer: Medicare Other

## 2013-03-31 ENCOUNTER — Ambulatory Visit: Payer: Medicare Other

## 2013-04-04 ENCOUNTER — Telehealth: Payer: Self-pay | Admitting: Nurse Practitioner

## 2013-04-04 ENCOUNTER — Other Ambulatory Visit (HOSPITAL_COMMUNITY): Payer: Medicare Other

## 2013-04-04 NOTE — Telephone Encounter (Signed)
Pt scheduled for 2D echo 04-04-13.  Insurance denied.  Dr. Aundra Dubin advised, cancel echo and schedule 2D echo 1 year from last echo (01-30-13).  Pt advised and will call back if continues w/problems.

## 2013-04-07 ENCOUNTER — Telehealth: Payer: Self-pay | Admitting: Pharmacist

## 2013-04-07 NOTE — Telephone Encounter (Signed)
Called pt to reschedule 1/9 missed appointments. Pt would like to reschedule MD appt as well. Pt will call schedulers to reschedule all appointments (lab/CC/MD) for the week of 04/10/13.

## 2013-04-11 ENCOUNTER — Telehealth: Payer: Self-pay | Admitting: Internal Medicine

## 2013-04-11 ENCOUNTER — Other Ambulatory Visit: Payer: Self-pay | Admitting: Internal Medicine

## 2013-04-11 NOTE — Telephone Encounter (Signed)
m, °

## 2013-04-14 ENCOUNTER — Ambulatory Visit (HOSPITAL_BASED_OUTPATIENT_CLINIC_OR_DEPARTMENT_OTHER): Payer: Medicare Other | Admitting: Internal Medicine

## 2013-04-14 ENCOUNTER — Other Ambulatory Visit (HOSPITAL_BASED_OUTPATIENT_CLINIC_OR_DEPARTMENT_OTHER): Payer: Medicare Other

## 2013-04-14 ENCOUNTER — Ambulatory Visit: Payer: Medicare Other | Admitting: Pharmacist

## 2013-04-14 ENCOUNTER — Telehealth: Payer: Self-pay | Admitting: Internal Medicine

## 2013-04-14 VITALS — BP 143/62 | HR 61 | Temp 97.3°F | Resp 18 | Ht 66.0 in | Wt 243.8 lb

## 2013-04-14 DIAGNOSIS — I82409 Acute embolism and thrombosis of unspecified deep veins of unspecified lower extremity: Secondary | ICD-10-CM

## 2013-04-14 DIAGNOSIS — Z87898 Personal history of other specified conditions: Secondary | ICD-10-CM

## 2013-04-14 LAB — PROTIME-INR
INR: 2.7 (ref 2.00–3.50)
Protime: 32.4 Seconds — ABNORMAL HIGH (ref 10.6–13.4)

## 2013-04-14 LAB — POCT INR: INR: 2.7

## 2013-04-14 NOTE — Progress Notes (Signed)
INR at goal Pt doing well with no complaints Pt will see Dr. Juliann Mule this afternoon Pt reports no unusual bleeding or bruising No missed or extra doses No medication or diet changes Plan: Continue taking Coumadin 5 mg daily except for 7.5 mg on Monday, Wednesday, and Friday.  Recheck INR in about 6 weeks on 05/26/13; lab at 2pm, Coumadin clinic at 2:15pm

## 2013-04-14 NOTE — Patient Instructions (Addendum)
INR at goal Continue taking Coumadin 5 mg daily except for 7.5 mg on Monday, Wednesday, and Friday.  Recheck INR in about 6 weeks on 05/26/13; lab at 2pm, Coumadin clinic at 2:15pm

## 2013-04-14 NOTE — Telephone Encounter (Signed)
Gave pt appt for and MD for next year Janaury 2016

## 2013-04-17 NOTE — Progress Notes (Signed)
[place], [place] [address]  DIAGNOSIS: DVT of lower extremity (deep venous thrombosis)  NON-HODGKIN'S LYMPHOMA, HX OF - Plan: CBC with Differential, Comprehensive metabolic panel (Cmet) - [facility], Lactate dehydrogenase (LDH) - [facility]  Chief Complaint  Patient presents with  . NON-HODGKIN'S LYMPHOMA, HX OF    CURRENT THERAPY:  INTERVAL HISTORY: [name] 68 y.o. female with a problem list consisting of:  1. Large B-cell non-Hodgkin's lymphoma diagnosed 02/2004, stage II involving the retroperitoneal nodes complicated by retroperitoneal fibrosis and left-sided hydronephrosis. Status post 8 cycles of chemotherapy with Rituxan, Cyclophosphamide, Hydroxydaunorubicin, Oncovin and Prednisone chemotherapy completing treatment on 09/04/2004. Status post consolidated radiation to retroperitoneum and pelvis following chemotherapy.  2. Left ureteral stricture with original ureteral double J stent insertion on 10/25/2003 by Dr. Doran Stabler. The patient then underwent several subsequent ureteral stent exchanges every 3-6 months. Ureteral stent removal by Dr. Festus Aloe on 04/28/2011.  3. Recurrent DVT of the left lower extremity first diagnosed 02/2004. She is currently on Coumadin therapy that is managed by the Coumadin Clinic.  4. Dyslipidemia  5. Depression  6. PET scan on 03/28/2012 showed no evidence of recurrent lymphoma.   [name] was last seen by NP Geni Bers A. Bray on 03/30/2012. Since her last office visit the patient reports that she feels well and denies any hospitalization or ER visits. She has a history of recurrent DVT in her LLE that is currently managed by our Coumadin Clinic. [name] believes her last colonoscopy was approximately 3 years ago and she cannot remember the last time she had a mammogram. [name] had a PET scan on 03/28/2012 which did not show any evidence of  recurrent lymphoma. The results of the PET scan were discussed with her and she was provided a copy of the PET scan.  [name] was encouraged to resume receiving annual mammograms. The patient denies any symptomatology and states that she has been doing well.    MEDICAL HISTORY: Past Medical History  Diagnosis Date  . DYSLIPIDEMIA 02/27/2009  . DEPRESSION 06/13/2008  . ALLERGIC RHINITIS 08/01/2009  . ABDOMINAL PAIN RIGHT UPPER QUADRANT 11/08/2008  . CARDIOVASCULAR FUNCTION STUDY, ABNORMAL 04/04/2009  . NON-HODGKIN'S LYMPHOMA, HX OF dx  2005--  chemoradiation completed 2006--  no recurrence    onocologist- dr odogwu--   . OSTEOARTHRITIS, MODERATE 04/07/2010  . CAD, NATIVE VESSEL 05/30/2009    cardiologist- dr Aundra Dubin- visit 04-13-2010 in epic-- denies cardiac symptoms  . DYSPNEA 02/27/2009  . Retroperitoneal fibrosis   . Frequency of urination   . History of Lyme disease   . History of Bell's palsy   . DDD (degenerative disc disease), lumbar   . GERD (gastroesophageal reflux disease)   . Anxiety   . 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 History of DVT of lower extremity 2006-- CHRONIC COUMADIN THERAPY  . Anticoagulated on Coumadin     INTERIM HISTORY: has DYSLIPIDEMIA; DEPRESSION; CAD, NATIVE VESSEL; ALLERGIC RHINITIS; FATIGUE; DYSPNEA; ABDOMINAL PAIN RIGHT UPPER QUADRANT; CARDIOVASCULAR FUNCTION STUDY, ABNORMAL; NON-HODGKIN'S LYMPHOMA, HX OF; OSTEOARTHRITIS, MODERATE; DVT, lower extremity; DVT of lower extremity (deep venous thrombosis); Cardiomyopathy; and Obesity (BMI 30-39.9) on her problem list.    ALLERGIES:  is allergic to chlorhexidine base and penicillins.  MEDICATIONS: has a current medication list which includes the following prescription(s): alprazolam, beta carotene w/minerals, bupropion, chlorhexidine, cholecalciferol, citalopram, co-enzyme q-10, diltiazem, fluticasone, lisinopril, loratadine, nitrostat, omeprazole, pitavastatin calcium,  warfarin,  and warfarin.  SURGICAL HISTORY:  Past Surgical History  Procedure Laterality Date  . Multiple cysto/ left ureteral stent exchanges  LAST ONE 10-28-10  . Total knee arthroplasty  OCT 2010    RIGHT  . Total knee arthroplasty  JAN 2010    LEFT  . Knee arthroscopy  2005    RIGHT  . Breast mass excision      BENIGN-- RIGHT BREAST  . Retroperitoneal bx  2007  . Exploratory laparotomy  2005    LYMPHOMA  . Tonsillectomy  CHILD  . Cardiovascular stress test  04-04-2009-- PER PT ASYMPTOMATIC-- MEDICAL MANAGEMENT    STUDY WAS EQUIVOCAL/ EF 49%/ GLOBAL HYPOKINESIS/ APPEARS TO BE A MILD ANTERIOR PERFUSION DEFECT COULD REPRESENT ISCHEMIA BUT DUE TO  ACTIVITY WORSE IN STRESS THAN AT REST POSS. THE DEFECT WAS ARTIFACTUAL  . Transthoracic echocardiogram  [id number]    MILD SYSTOLIC DYSFUNCTION/ EF 61-95%/ MODERATE DIASTOLIC DYSFUCTION/ MILD MR/ MILD BIATRIAL ENLARGEMENT  . Ct angiogram  FEB 2011    CALCIUM SCORE 161/ POSS. MODERATE MID LAD STENOSIS  . Cystoscopy w/ ureteral stent removal  04/28/2011    Procedure: CYSTOSCOPY WITH STENT REMOVAL;  Surgeon: Fredricka Bonine, MD;  Location: [facility];  Service: Urology;;  . Cystoscopy w/ retrogrades  04/28/2011    Procedure: CYSTOSCOPY WITH RETROGRADE PYELOGRAM;  Surgeon: Fredricka Bonine, MD;  Location: [facility];  Service: Urology;  Laterality: Left;    REVIEW OF SYSTEMS:   Constitutional: Denies fevers, chills or abnormal weight loss Eyes: Denies blurriness of vision Ears, nose, mouth, throat, and face: Denies mucositis or sore throat Respiratory: Denies cough, dyspnea or wheezes Cardiovascular: Denies palpitation, chest discomfort or lower extremity swelling Gastrointestinal:  Denies nausea, heartburn or change in bowel habits Skin: Denies abnormal skin rashes Lymphatics: Denies new lymphadenopathy or easy bruising Neurological:Denies numbness, tingling or new weaknesses Behavioral/Psych:  Mood is stable, no new changes  All other systems were reviewed with the patient and are negative.  PHYSICAL EXAMINATION: ECOG PERFORMANCE STATUS: 0 - Asymptomatic  Blood pressure 143/62, pulse 61, temperature 97.3 F (36.3 C), temperature source Oral, resp. rate 18, height 5\' 6"  (1.676 m), weight 243 lb 12.8 oz (110.587 kg), SpO2 97.00%.  GENERAL:alert, no distress and comfortable, obese well nourished SKIN: skin color, texture, turgor are normal, no rashes or significant lesions EYES: normal, Conjunctiva are pink and non-injected, sclera clear OROPHARYNX:no exudate, no erythema and lips, buccal mucosa, and tongue normal  NECK: supple, thyroid normal size, non-tender, without nodularity LYMPH:  no palpable lymphadenopathy in the cervical, axillary or supraclavicular LUNGS: clear to auscultation and percussion with normal breathing effort HEART: regular rate & rhythm and no murmurs and 1+ lower extremity edema bilaterally with varicosities present ABDOMEN:abdomen soft, non-tender and normal bowel sounds Musculoskeletal:no cyanosis of digits and no clubbing  NEURO: alert & oriented x 3 with fluent speech, no focal motor/sensory deficits   LABORATORY DATA: No results found for this or any previous visit (from the past 48 hour(s)).     Labs:  Lab Results  Component Value Date   WBC 4.8 10/07/2012   HGB 12.0 10/07/2012   HCT 36.0 10/07/2012   MCV 88.9 10/07/2012   PLT 173 10/07/2012   NEUTROABS 2.5 10/07/2012      Chemistry      Component Value Date/Time   NA 138 11/30/2012 1341   NA 139 05/10/2012 1424   K 4.8 11/30/2012 1341   K 4.2 05/10/2012 1424   CL 104  11/30/2012 1341   CL 105 05/10/2012 1424   CO2 28 11/30/2012 1341   CO2 25 05/10/2012 1424   BUN 20 11/30/2012 1341   BUN 17.2 05/10/2012 1424   CREATININE 1.0 11/30/2012 1341   CREATININE 0.9 05/10/2012 1424      Component Value Date/Time   CALCIUM 9.3 11/30/2012 1341   CALCIUM 9.7 05/10/2012 1424   ALKPHOS 55 11/30/2012 1341    ALKPHOS 71 05/10/2012 1424   AST 18 11/30/2012 1341   AST 17 05/10/2012 1424   ALT 17 11/30/2012 1341   ALT 22 05/10/2012 1424   BILITOT 0.6 11/30/2012 1341   BILITOT 0.38 05/10/2012 1424      Estimated Creatinine Clearance: 66.9 ml/min (by C-G formula based on Cr of 1).  Coagulation profile  Recent Labs Lab 04/14/13 1450 04/14/13 1500  INR 2.70 2.7  PROTIME 32.4*  --    Studies:  No results found.   RADIOGRAPHIC STUDIES: 1. CT chest with contrast on 12/25/2008 showed there are no worrisome lytic or sclerotic lesions.  2. Ct of the abdomen and pelvis on 12/25/2008 showed review of the visualized osseous structures is significant for mild  spondylosis. The right kidney appears normal. There is a left-sided ureteral stent in place. Left renal atrophy and renal cysts again noted. The appearance is unchanged from previous exam. Negative for mass or adenopathy.  3. PET scan on 03/28/2012 showed no evidence of recurrent lymphoma. Neck: No hypermetabolic lymph nodes in the neck. CT images show no acute findings. No pathologically enlarged lymph nodes. Chest: No hypermetabolic activity in the chest. CT images show coronary artery calcification. No pericardial or pleural effusion. Abdomen/Pelvis: No abnormal hypermetabolism in the abdomen or pelvis. CT images show no acute findings. Left renal atrophy and cortical scarring. Atherosclerotic calcification of the arterial vasculature without abdominal aortic aneurysm. Skeleton: No focal hypermetabolic activity to suggest skeletal metastasis. There are degenerative changes in the spine.   ASSESSMENT: [name] 68 y.o. female with a history of DVT of lower extremity (deep venous thrombosis)  NON-HODGKIN'S LYMPHOMA, HX OF - Plan: CBC with Differential, Comprehensive metabolic panel (Cmet) - [facility], Lactate dehydrogenase (LDH) - [facility]   PLAN: 1. NHL. --Mrs. Ganoe appears to be doing quite well. Her current exam, laboratory results and priorPET  scan indicate no evidence of recurrence.   --I agree that annual PET scans are not necessary unless the patient has a change and/or becomes symptomatic. We may consider other imaging studies in the future.   2. Recurrent Lower extremity DVT. --[name] will continue to have her history of left deep vein thrombosis managed by the Coumadin Clinic and she continues on chronic Coumadin therapy.   3. Follow-up.  We plan to see [name] again in a year's time  at which time we will check a CBC, CMP and LDH. We encouraged the patient to contact us if she has any questions or concerns. [name] was encouraged to get a mammogram soon.   All questions were answered. The patient knows to call the clinic with any problems, questions or concerns. We can certainly see the patient much sooner if necessary.  I spent 10 minutes counseling the patient face to face. The total time spent in the appointment was 15 minutes.    Falecia Vannatter, MD 04/17/2013 5:29 AM

## 2013-04-18 ENCOUNTER — Other Ambulatory Visit: Payer: Self-pay | Admitting: Pharmacist

## 2013-04-18 ENCOUNTER — Telehealth: Payer: Self-pay | Admitting: Pharmacist

## 2013-04-18 NOTE — Telephone Encounter (Signed)
Pt had trouble getting refill of Warfarin 2.5 mg tablets -- too early. Pt ran out of Warfarin 5 mg tablets (since has had them refilled) & she made her dose out w/ 2.5 mg tablets she had at home.  She used all of them up & that is why she is out of 2.5 mg tablets. I s/w Colette, tech at [employer].  She will try to over-ride the message of being too early. Pt aware she may have to pay out of pocket for 2.5 mg tablets. She is awaiting call back from [place] at [facility] to help her resolve the problem. Pt does have the 5 mg tablets so she can make her doses out. Kennith Center, Pharm.D., CPP 04/18/2013@2 :58 PM

## 2013-05-24 ENCOUNTER — Telehealth: Payer: Self-pay | Admitting: Family Medicine

## 2013-05-24 MED ORDER — ALPRAZOLAM 1 MG PO TABS: 1.0000 mg | ORAL_TABLET | Freq: Two times a day (BID) | ORAL | Status: DC

## 2013-05-24 NOTE — Telephone Encounter (Signed)
Pt states she needs refill of ALPRAZolam (XANAX) 1 MG tablet Pt is out of this med pt states she contacted pharm Sunday ,now they tell her they did not fax [place] [facility] [address]  [phone] pls note new pharm for this med.

## 2013-05-24 NOTE — Telephone Encounter (Signed)
Last visit 03/30/13 Last refill 11/30/12 #60 5 refill

## 2013-05-24 NOTE — Telephone Encounter (Signed)
RX called into pharmacy

## 2013-05-24 NOTE — Telephone Encounter (Signed)
Refill for 6 months. 

## 2013-05-26 ENCOUNTER — Other Ambulatory Visit (HOSPITAL_BASED_OUTPATIENT_CLINIC_OR_DEPARTMENT_OTHER): Payer: Medicare Other

## 2013-05-26 ENCOUNTER — Ambulatory Visit (HOSPITAL_BASED_OUTPATIENT_CLINIC_OR_DEPARTMENT_OTHER): Payer: Medicare Other | Admitting: Pharmacist

## 2013-05-26 DIAGNOSIS — I82409 Acute embolism and thrombosis of unspecified deep veins of unspecified lower extremity: Secondary | ICD-10-CM

## 2013-05-26 LAB — PROTIME-INR
INR: 1.8 — ABNORMAL LOW (ref 2.00–3.50)
Protime: 21.6 Seconds — ABNORMAL HIGH (ref 10.6–13.4)

## 2013-05-26 LAB — POCT INR: INR: 1.8

## 2013-05-26 NOTE — Progress Notes (Signed)
INR slightly below goal today. No missed doses of coumadin. Pt has been eating more lettuce; iceberg and romaine lettuce. Pt has also been eating more broccoli lately. She received a Cortisone injection for shoulder pain on 05/23/13. She is doing physical therapy also for the shoulder pain. Pt doing well. No concerns regarding anticoagulation. No s/s of clotting noted. Take coumadin 10mg  today.  On 05/27/13, continue taking Coumadin 5 mg daily except for 7.5mg  on MWF.  She has been fairly stable on this coumadin dose for months. Recheck INR in 3 weeks on 06/16/13; lab at 2:30pm and Coumadin clinic at 2:45pm. Will evaluate INR and adjust coumadin dose as needed at next visit.

## 2013-05-26 NOTE — Patient Instructions (Signed)
Take coumadin 10mg  today.  On 05/27/13, continue taking Coumadin 5mg  daily except 7.5mg  on MWF.  Recheck INR in 3 weeks on 06/16/13; lab at 2:30pm and Coumadin clinic at 2:45pm.

## 2013-06-09 ENCOUNTER — Ambulatory Visit (INDEPENDENT_AMBULATORY_CARE_PROVIDER_SITE_OTHER): Payer: Medicare Other | Admitting: Family Medicine

## 2013-06-09 ENCOUNTER — Telehealth: Payer: Self-pay | Admitting: Internal Medicine

## 2013-06-09 ENCOUNTER — Encounter: Payer: Self-pay | Admitting: Family Medicine

## 2013-06-09 VITALS — BP 130/72 | HR 82 | Temp 98.7°F | Wt 247.0 lb

## 2013-06-09 DIAGNOSIS — J45909 Unspecified asthma, uncomplicated: Secondary | ICD-10-CM

## 2013-06-09 MED ORDER — PREDNISONE 10 MG PO TABS: ORAL_TABLET | ORAL | Status: DC

## 2013-06-09 MED ORDER — DOXYCYCLINE HYCLATE 100 MG PO TABS: 100.0000 mg | ORAL_TABLET | Freq: Two times a day (BID) | ORAL | Status: DC

## 2013-06-09 MED ORDER — ALBUTEROL SULFATE HFA 108 (90 BASE) MCG/ACT IN AERS: 2.0000 | INHALATION_SPRAY | RESPIRATORY_TRACT | Status: DC | PRN

## 2013-06-09 NOTE — Telephone Encounter (Signed)
Please look at pts chart. She wants to refer herself for possible thyroid issues. Patient feels that her current MD is not addressing her issue. She has hair loss and adrenal issues would like a second opinion. Do you think you could help her?

## 2013-06-09 NOTE — Patient Instructions (Signed)
Bronchospasm, Adult °A bronchospasm is when the tubes that carry air in and out of your lungs (airwarys) spasm or tighten. During a bronchospasm it is hard to breathe. This is because the airways get smaller. A bronchospasm can be triggered by: °· Allergies. These may be to animals, pollen, food, or mold. °· Infection. This is a common cause of bronchospasm. °· Exercise. °· Irritants. These include pollution, cigarette smoke, strong odors, aerosol sprays, and paint fumes. °· Weather changes. °· Stress. °· Being emotional. °HOME CARE  °· Always have a plan for getting help. Know when to call your doctor and local emergency services (911 in the [place] Know where you can get emergency care. °· Only take medicines as told by your doctor. °· If you were prescribed an inhaler or nebulizer machine, ask your doctor how to use it correctly. Always use a spacer with your inhaler if you were given one. °· Stay calm during an attack. Try to relax and breathe more slowly. °· Control your home environment: °· Change your heating and air conditioning filter at least once a month. °· Limit your use of fireplaces and wood stoves. °· Do not  smoke. Do not  allow smoking in your home. °· Avoid perfumes and fragrances. °· Get rid of pests (such as roaches and mice) and their droppings. °· Throw away plants if you see mold on them. °· Keep your house clean and dust free. °· Replace carpet with wood, tile, or vinyl flooring. Carpet can trap dander and dust. °· Use allergy-proof pillows, mattress covers, and box spring covers. °· Wash bed sheets and blankets every week in hot water. Dry them in a dryer. °· Use blankets that are made of polyester or cotton. °· Wash hands frequently. °GET HELP IF: °· You have muscle aches. °· You have chest pain. °· The thick spit you spit or cough up (sputum) changes from clear or white to yellow, green, gray, or bloody. °· The thick spit you spit or cough up gets thicker. °· There are problems that may be  related to the medicine you are given such as: °· A rash. °· Itching. °· Swelling. °· Trouble breathing. °GET HELP RIGHT AWAY IF: °· You feel you cannot breathe or catch your breath. °· You cannot stop coughing. °· Your treatment is not helping you breathe better. °MAKE SURE YOU:  °· Understand these instructions. °· Will watch your condition. °· Will get help right away if you are not doing well or get worse. °Document Released: 01/04/2009 Document Revised: 11/09/2012 Document Reviewed: 08/30/2012 °[employer]® Patient Information ©2014 [employer]. ° °

## 2013-06-09 NOTE — Progress Notes (Signed)
Pre visit review using our clinic review tool, if applicable. No additional management support is needed unless otherwise documented below in the visit note. 

## 2013-06-09 NOTE — Telephone Encounter (Signed)
Sure, I can see her at 4:15 on 03/31.

## 2013-06-09 NOTE — Progress Notes (Signed)
Subjective:    Patient ID: [name], female    DOB: [birth year], 68 y.o.   MRN: [id number]  Cough Associated symptoms include a fever, a sore throat and wheezing. Pertinent negatives include no chills.   Acute visit. Ex-smoker. She presents with three-day history of cough with productive sputum. Questionable low-grade fever though none today. She had some initial sore throat which is slightly better today. She's also some erythema involving her right upper gum. She previously has taken doxycycline per dentist. She is allergic to penicillin. She's noted some wheezing. She's had some mild dyspnea with exertion but only minimally. No chest pains.  Past Medical History  Diagnosis Date  . DYSLIPIDEMIA 02/27/2009  . DEPRESSION 06/13/2008  . ALLERGIC RHINITIS 08/01/2009  . ABDOMINAL PAIN RIGHT UPPER QUADRANT 11/08/2008  . CARDIOVASCULAR FUNCTION STUDY, ABNORMAL 04/04/2009  . NON-HODGKIN'S LYMPHOMA, HX OF dx  2005--  chemoradiation completed 2006--  no recurrence    onocologist- dr odogwu--   . OSTEOARTHRITIS, MODERATE 04/07/2010  . CAD, NATIVE VESSEL 05/30/2009    cardiologist- dr Aundra Dubin- visit 04-13-2010 in epic-- denies cardiac symptoms  . DYSPNEA 02/27/2009  . Retroperitoneal fibrosis   . Frequency of urination   . History of Lyme disease   . History of Bell's palsy   . DDD (degenerative disc disease), lumbar   . GERD (gastroesophageal reflux disease)   . Anxiety   . 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 History of DVT of lower extremity 2006-- CHRONIC COUMADIN THERAPY  . Anticoagulated on Coumadin    Past Surgical History  Procedure Laterality Date  . Multiple cysto/ left ureteral stent exchanges  LAST ONE 10-28-10  . Total knee arthroplasty  OCT 2010    RIGHT  . Total knee arthroplasty  JAN 2010    LEFT  . Knee arthroscopy  2005    RIGHT  . Breast mass excision      BENIGN-- RIGHT BREAST  . Retroperitoneal bx  2007  . Exploratory  laparotomy  2005    LYMPHOMA  . Tonsillectomy  CHILD  . Cardiovascular stress test  04-04-2009-- PER PT ASYMPTOMATIC-- MEDICAL MANAGEMENT    STUDY WAS EQUIVOCAL/ EF 49%/ GLOBAL HYPOKINESIS/ APPEARS TO BE A MILD ANTERIOR PERFUSION DEFECT COULD REPRESENT ISCHEMIA BUT DUE TO  ACTIVITY WORSE IN STRESS THAN AT REST POSS. THE DEFECT WAS ARTIFACTUAL  . Transthoracic echocardiogram  [id number]    MILD SYSTOLIC DYSFUNCTION/ EF 89-21%/ MODERATE DIASTOLIC DYSFUCTION/ MILD MR/ MILD BIATRIAL ENLARGEMENT  . Ct angiogram  FEB 2011    CALCIUM SCORE 161/ POSS. MODERATE MID LAD STENOSIS  . Cystoscopy w/ ureteral stent removal  04/28/2011    Procedure: CYSTOSCOPY WITH STENT REMOVAL;  Surgeon: Fredricka Bonine, MD;  Location: [facility];  Service: Urology;;  . Cystoscopy w/ retrogrades  04/28/2011    Procedure: CYSTOSCOPY WITH RETROGRADE PYELOGRAM;  Surgeon: Fredricka Bonine, MD;  Location: [facility];  Service: Urology;  Laterality: Left;    reports that she quit smoking about 5 years ago. Her smoking use included Cigarettes. She has a 104 pack-year smoking history. She has never used smokeless tobacco. She reports that she drinks alcohol. She reports that she does not use illicit drugs. family history includes Arthritis in her mother; Diabetes in her sister; Heart disease in her mother; Hypertension in her sister; Stroke in her sister. Allergies  Allergen Reactions  . Chlorhexidine Base Itching    CHG WIPES  . Penicillins Hives  Review of Systems  Constitutional: Positive for fever. Negative for chills, appetite change and unexpected weight change.  HENT: Positive for sore throat. Negative for congestion.   Respiratory: Positive for cough and wheezing.        Objective:   Physical Exam  Constitutional: She appears well-developed and well-nourished.  HENT:  Right Ear: External ear normal.  Left Ear: External ear normal.  Mouth/Throat: Oropharynx is  clear and moist.  Neck: Neck supple.  Cardiovascular: Normal rate.   Pulmonary/Chest: Effort normal. She has wheezes. She has no rales.  Musculoskeletal: She exhibits no edema.  Lymphadenopathy:    She has no cervical adenopathy.          Assessment & Plan:  Asthmatic bronchitis. Prednisone taper over 7 days. Albuterol inhaler for as needed use. Doxycycline 100 mg twice a day for 10 days.

## 2013-06-12 ENCOUNTER — Telehealth: Payer: Self-pay | Admitting: Pharmacist

## 2013-06-12 ENCOUNTER — Telehealth: Payer: Self-pay | Admitting: Family Medicine

## 2013-06-12 ENCOUNTER — Ambulatory Visit: Payer: Self-pay | Admitting: Internal Medicine

## 2013-06-12 NOTE — Telephone Encounter (Signed)
Patient Information:  Caller Name: Dashea  Phone: [phone]  Patient: [name], [name]  Gender: Female  DOB: 11-16-1945  Age: 68 Years  PCP: Carolann Littler (Family Practice)  Office Follow Up:  Does the office need to follow up with this patient?: No  Instructions For The Office: N/A  RN Note:  Pt offered appt for today.  Pt agrees to recheck and call back information  Symptoms  Reason For Call & Symptoms: Pt reports that she was seen for Cough and Wheezing, Albuterol, On 3/20.  Pt states that her cough is still present and she is unable to sleep.  She is requesting a cough medication for this.  She reports wheezing.  Pt is taking Doxycycline.  Reviewed Health History In EMR: Yes  Reviewed Medications In EMR: Yes  Reviewed Allergies In EMR: Yes  Reviewed Surgeries / Procedures: Yes  Date of Onset of Symptoms: 06/12/2013  Guideline(s) Used:  Cough  Disposition Per Guideline:   Go to Office Now  Reason For Disposition Reached:   Wheezing is present  Advice Given:  Call Back If:  You become worse.  Patient Will Follow Care Advice:  YES  Appointment Scheduled:  06/12/2013 14:30:00 Appointment Scheduled Provider:  Shawna Orleans, Doe-Hyun Herbie [place]) (Adults only)

## 2013-06-12 NOTE — Telephone Encounter (Signed)
Pt called to inform us she went to MD on Sat and has been placed on a prednisone taper (starts with 40 mg for 4 days and then decrease by 10mg  every 4 days), doxycycline and albuterol Moved her appmt up at request of MD (from Fri (3/27) to Wed (3/25)) Confirmed pt has allergy to pcn She now has appmt on Wed, 3/25 lab at 12:45 and CC at 1:00.

## 2013-06-12 NOTE — Telephone Encounter (Signed)
Lm for pt to return call to make an appt

## 2013-06-13 ENCOUNTER — Ambulatory Visit (INDEPENDENT_AMBULATORY_CARE_PROVIDER_SITE_OTHER): Payer: Medicare Other | Admitting: Family Medicine

## 2013-06-13 ENCOUNTER — Encounter: Payer: Self-pay | Admitting: Family Medicine

## 2013-06-13 VITALS — BP 130/80 | HR 91 | Temp 97.8°F | Wt 247.0 lb

## 2013-06-13 DIAGNOSIS — J441 Chronic obstructive pulmonary disease with (acute) exacerbation: Secondary | ICD-10-CM

## 2013-06-13 MED ORDER — METHYLPREDNISOLONE ACETATE 80 MG/ML IJ SUSP
120.0000 mg | Freq: Once | INTRAMUSCULAR | Status: AC
  Administered 2013-06-13: 120 mg via INTRAMUSCULAR

## 2013-06-13 MED ORDER — HYDROCODONE-HOMATROPINE 5-1.5 MG/5ML PO SYRP: 5.0000 mL | ORAL_SOLUTION | Freq: Four times a day (QID) | ORAL | Status: AC | PRN

## 2013-06-13 NOTE — Progress Notes (Signed)
Subjective:    Patient ID: [name], female    DOB: 1946/01/03, 68 y.o.   MRN: [id number]  Cough Associated symptoms include wheezing. Pertinent negatives include no chest pain, chills or fever.   Patient seen for followup with persistent cough. Refer to recent note:  "Acute visit. Ex-smoker. She presents with three-day history of cough with productive sputum. Questionable low-grade fever though none today. She had some initial sore throat which is slightly better today. She's also some erythema involving her right upper gum. She previously has taken doxycycline per dentist. She is allergic to penicillin. She's noted some wheezing. She's had some mild dyspnea with exertion but only minimally. No chest pains."  She has significant wheezing. We placed her on prednisone taper along with doxycycline and albuterol inhaler. She is no worse and slightly better but still having significant wheezing and coughing especially at night. She's not sure the albuterol has helped much. No fever. No hemoptysis.  Past Medical History  Diagnosis Date  . DYSLIPIDEMIA 02/27/2009  . DEPRESSION 06/13/2008  . ALLERGIC RHINITIS 08/01/2009  . ABDOMINAL PAIN RIGHT UPPER QUADRANT 11/08/2008  . CARDIOVASCULAR FUNCTION STUDY, ABNORMAL 04/04/2009  . NON-HODGKIN'S LYMPHOMA, HX OF dx  2005--  chemoradiation completed 2006--  no recurrence    onocologist- dr odogwu--   . OSTEOARTHRITIS, MODERATE 04/07/2010  . CAD, NATIVE VESSEL 05/30/2009    cardiologist- dr Aundra Dubin- visit 04-13-2010 in epic-- denies cardiac symptoms  . DYSPNEA 02/27/2009  . Retroperitoneal fibrosis   . Frequency of urination   . History of Lyme disease   . History of Bell's palsy   . DDD (degenerative disc disease), lumbar   . GERD (gastroesophageal reflux disease)   . Anxiety   . 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 History of DVT of lower extremity 2006-- CHRONIC COUMADIN THERAPY  . Anticoagulated on  Coumadin    Past Surgical History  Procedure Laterality Date  . Multiple cysto/ left ureteral stent exchanges  LAST ONE 10-28-10  . Total knee arthroplasty  OCT 2010    RIGHT  . Total knee arthroplasty  JAN 2010    LEFT  . Knee arthroscopy  2005    RIGHT  . Breast mass excision      BENIGN-- RIGHT BREAST  . Retroperitoneal bx  2007  . Exploratory laparotomy  2005    LYMPHOMA  . Tonsillectomy  CHILD  . Cardiovascular stress test  04-04-2009-- PER PT ASYMPTOMATIC-- MEDICAL MANAGEMENT    STUDY WAS EQUIVOCAL/ EF 49%/ GLOBAL HYPOKINESIS/ APPEARS TO BE A MILD ANTERIOR PERFUSION DEFECT COULD REPRESENT ISCHEMIA BUT DUE TO  ACTIVITY WORSE IN STRESS THAN AT REST POSS. THE DEFECT WAS ARTIFACTUAL  . Transthoracic echocardiogram  [id number]    MILD SYSTOLIC DYSFUNCTION/ EF 84-16%/ MODERATE DIASTOLIC DYSFUCTION/ MILD MR/ MILD BIATRIAL ENLARGEMENT  . Ct angiogram  FEB 2011    CALCIUM SCORE 161/ POSS. MODERATE MID LAD STENOSIS  . Cystoscopy w/ ureteral stent removal  04/28/2011    Procedure: CYSTOSCOPY WITH STENT REMOVAL;  Surgeon: Fredricka Bonine, MD;  Location: [facility];  Service: Urology;;  . Cystoscopy w/ retrogrades  04/28/2011    Procedure: CYSTOSCOPY WITH RETROGRADE PYELOGRAM;  Surgeon: Fredricka Bonine, MD;  Location: [facility];  Service: Urology;  Laterality: Left;    reports that she quit smoking about 5 years ago. Her smoking use included Cigarettes. She has a 104 pack-year smoking history. She has never used smokeless tobacco. She reports that she  drinks alcohol. She reports that she does not use illicit drugs. family history includes Arthritis in her mother; Diabetes in her sister; Heart disease in her mother; Hypertension in her sister; Stroke in her sister. Allergies  Allergen Reactions  . Chlorhexidine Base Itching    CHG WIPES  . Penicillins Hives       Review of Systems  Constitutional: Negative for fever and chills.    Respiratory: Positive for cough and wheezing.   Cardiovascular: Negative for chest pain and leg swelling.  Endocrine: Negative for polydipsia and polyuria.       Objective:   Physical Exam  Constitutional: She appears well-developed and well-nourished.  HENT:  Mouth/Throat: Oropharynx is clear and moist.  Neck: Neck supple.  Cardiovascular: Normal rate.   Pulmonary/Chest: Effort normal. She has wheezes. She has no rales.  Musculoskeletal: She exhibits no edema.          Assessment & Plan:  Cough with persistent wheezing. Pulse oximetry 98%. No respiratory distress. Continue doxycycline. Depo-Medrol 120 mg given. Continue albuterol as needed. Followup promptly for fever or worsening symptoms. Limited Hycodan cough syrup 1 teaspoon each bedtime for severe cough

## 2013-06-13 NOTE — Addendum Note (Signed)
Addended by: Noe Gens E on: 06/13/2013 10:15 AM   Modules accepted: Orders

## 2013-06-13 NOTE — Progress Notes (Signed)
Pre visit review using our clinic review tool, if applicable. No additional management support is needed unless otherwise documented below in the visit note. 

## 2013-06-13 NOTE — Patient Instructions (Signed)
Follow up for any fever or increasing shortness of breath. 

## 2013-06-14 ENCOUNTER — Other Ambulatory Visit: Payer: Medicare Other

## 2013-06-14 ENCOUNTER — Telehealth: Payer: Self-pay | Admitting: Family Medicine

## 2013-06-14 ENCOUNTER — Ambulatory Visit: Payer: Medicare Other

## 2013-06-14 NOTE — Telephone Encounter (Signed)
Relevant patient education mailed to patient.  

## 2013-06-16 ENCOUNTER — Other Ambulatory Visit: Payer: Medicare Other

## 2013-06-16 ENCOUNTER — Ambulatory Visit: Payer: Medicare Other

## 2013-06-22 ENCOUNTER — Telehealth: Payer: Self-pay | Admitting: Pharmacist

## 2013-06-22 NOTE — Telephone Encounter (Signed)
Left VM to call and schedule coumadin clinic appt. 

## 2013-07-07 ENCOUNTER — Other Ambulatory Visit (HOSPITAL_BASED_OUTPATIENT_CLINIC_OR_DEPARTMENT_OTHER): Payer: Medicare Other

## 2013-07-07 ENCOUNTER — Ambulatory Visit (HOSPITAL_BASED_OUTPATIENT_CLINIC_OR_DEPARTMENT_OTHER): Payer: Self-pay | Admitting: Pharmacist

## 2013-07-07 DIAGNOSIS — I82409 Acute embolism and thrombosis of unspecified deep veins of unspecified lower extremity: Secondary | ICD-10-CM

## 2013-07-07 LAB — PROTIME-INR
INR: 2.1 (ref 2.00–3.50)
PROTIME: 25.2 s — AB (ref 10.6–13.4)

## 2013-07-07 LAB — POCT INR: INR: 2.1

## 2013-07-07 NOTE — Progress Notes (Signed)
INR within goal today. Pt took coumadin as instructed at last visit. No missed coumadin doses. No changes in diet. Usual bruising. Pt developed illness at the end of March. She was prescribed Doxycycline 100mg  po BID x 10days (~ 06/09/13), cough medication with codeine and given a "few" cortisone injections. All medications for cough/cold are finished. Continue taking Coumadin 5mg  daily except for 7.5mg  on MWF.  Recheck INR in 4 weeks on 08/04/13; lab at 2:00pm and Coumadin clinic at 2:15pm.

## 2013-07-07 NOTE — Patient Instructions (Signed)
Continue taking Coumadin 5mg  daily except for 7.5mg  on MWF.  Recheck INR in 4 weeks on 08/04/13; lab at 2:00pm and Coumadin clinic at 2:15pm.

## 2013-07-10 ENCOUNTER — Ambulatory Visit: Payer: Medicare Other | Admitting: Internal Medicine

## 2013-07-10 ENCOUNTER — Ambulatory Visit (INDEPENDENT_AMBULATORY_CARE_PROVIDER_SITE_OTHER): Payer: Medicare Other | Admitting: Internal Medicine

## 2013-07-10 ENCOUNTER — Encounter: Payer: Self-pay | Admitting: Internal Medicine

## 2013-07-10 VITALS — BP 114/72 | HR 98 | Temp 98.4°F | Resp 12 | Ht 66.5 in | Wt 244.8 lb

## 2013-07-10 DIAGNOSIS — R5383 Other fatigue: Principal | ICD-10-CM

## 2013-07-10 DIAGNOSIS — R5381 Other malaise: Secondary | ICD-10-CM

## 2013-07-10 LAB — CBC WITH DIFFERENTIAL/PLATELET
BASOS PCT: 0.6 % (ref 0.0–3.0)
Basophils Absolute: 0 10*3/uL (ref 0.0–0.1)
EOS ABS: 0.1 10*3/uL (ref 0.0–0.7)
EOS PCT: 1.1 % (ref 0.0–5.0)
HCT: 39.7 % (ref 36.0–46.0)
HEMOGLOBIN: 13.3 g/dL (ref 12.0–15.0)
Lymphocytes Relative: 31.4 % (ref 12.0–46.0)
Lymphs Abs: 2 10*3/uL (ref 0.7–4.0)
MCHC: 33.5 g/dL (ref 30.0–36.0)
MCV: 90.8 fl (ref 78.0–100.0)
Monocytes Absolute: 0.6 10*3/uL (ref 0.1–1.0)
Monocytes Relative: 9.2 % (ref 3.0–12.0)
NEUTROS ABS: 3.6 10*3/uL (ref 1.4–7.7)
NEUTROS PCT: 57.7 % (ref 43.0–77.0)
Platelets: 217 10*3/uL (ref 150.0–400.0)
RBC: 4.37 Mil/uL (ref 3.87–5.11)
RDW: 16.6 % — ABNORMAL HIGH (ref 11.5–14.6)
WBC: 6.2 10*3/uL (ref 4.5–10.5)

## 2013-07-10 LAB — VITAMIN B12: Vitamin B-12: 654 pg/mL (ref 211–911)

## 2013-07-10 LAB — TSH: TSH: 1.27 u[IU]/mL (ref 0.35–5.50)

## 2013-07-10 LAB — T3, FREE: T3, Free: 2.3 pg/mL (ref 2.3–4.2)

## 2013-07-10 LAB — T4, FREE: Free T4: 1.13 ng/dL (ref 0.60–1.60)

## 2013-07-10 NOTE — Progress Notes (Signed)
Patient ID: [name], female   DOB: Jun 02, 1945, 68 y.o.   MRN: [id number]   HPI  [name] is a 68 y.o.-year-old female, self-referred for evaluation for fatigue, hair loss, and possible hypothyroidism.  I reviewed pt's thyroid tests: Lab Results  Component Value Date   TSH 1.57 01/08/2011   TSH 2.10 02/27/2009   TSH 1.05 05/30/2008    Pt denies feeling nodules in neck, hoarseness, dysphagia/odynophagia, SOB with lying down.  Pt describes: - eye brows hair loss (1/2 outer portion) - + hair loss, thinning, no volume; + FH: mother developed baldness - + weight gain - started at 68 y/o >> 226 lbs >> stopped smoking >> 244 lbs. Not dieting. Exercising by walking.   Meals:   - Breakfast: skips - Lunch: sandwich - Dinner: meat + veggies + starch - Snacks: 1 - no cold intolerance; no more hot flushes - + fatigue >> needs to take a nap usually during the day. She goes to sleep late at night >> wakes up late.  - no constipation - + thin, dry skin - + depression, + anxiety: bad situation at home throughout her life: alcoholic husband  She has no FH of thyroid disorders in. No FH of thyroid cancer.  No h/o radiation tx to head or neck. No recent use of iodine supplements.  I reviewed her chart and she also has a history of non-Hodgkin lymphoma, s/p Rx tx, also DVTx2, CMP, CAD, HL.  She has been on a 3 week Prednisone course 1.5 mo ago. She had another course when she was dx. With Bells palsy, aand also when she had Lymphoma. She had 2 R shoulder steroid injections this year so far.  ROS: Constitutional: see HPI, + nocturia Eyes: no blurry vision, no xerophthalmia ENT: no sore throat, no nodules palpated in throat, no dysphagia/odynophagia, no hoarseness Cardiovascular: no CP/SOB/+ palpitations/no leg swelling Respiratory: no cough/SOB Gastrointestinal: no N/V/D/C, + heartburn Musculoskeletal: no muscle/+ joint aches Skin: no rashes, + easy bruising, + hair  loss Neurological: no tremors/numbness/tingling/dizziness Psychiatric: no depression/anxiety  Past Medical History  Diagnosis Date  . DYSLIPIDEMIA 02/27/2009  . DEPRESSION 06/13/2008  . ALLERGIC RHINITIS 08/01/2009  . ABDOMINAL PAIN RIGHT UPPER QUADRANT 11/08/2008  . CARDIOVASCULAR FUNCTION STUDY, ABNORMAL 04/04/2009  . NON-HODGKIN'S LYMPHOMA, HX OF dx  2005--  chemoradiation completed 2006--  no recurrence    onocologist- dr odogwu--   . OSTEOARTHRITIS, MODERATE 04/07/2010  . CAD, NATIVE VESSEL 05/30/2009    cardiologist- dr Aundra Dubin- visit 04-13-2010 in epic-- denies cardiac symptoms  . DYSPNEA 02/27/2009  . Retroperitoneal fibrosis   . Frequency of urination   . History of Lyme disease   . History of Bell's palsy   . DDD (degenerative disc disease), lumbar   . GERD (gastroesophageal reflux disease)   . Anxiety   . 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 History of DVT of lower extremity 2006-- CHRONIC COUMADIN THERAPY  . Anticoagulated on Coumadin    Past Surgical History  Procedure Laterality Date  . Multiple cysto/ left ureteral stent exchanges  LAST ONE 10-28-10  . Total knee arthroplasty  OCT 2010    RIGHT  . Total knee arthroplasty  JAN 2010    LEFT  . Knee arthroscopy  2005    RIGHT  . Breast mass excision      BENIGN-- RIGHT BREAST  . Retroperitoneal bx  2007  . Exploratory laparotomy  2005    LYMPHOMA  . Tonsillectomy  CHILD  .  Cardiovascular stress test  04-04-2009-- PER PT ASYMPTOMATIC-- MEDICAL MANAGEMENT    STUDY WAS EQUIVOCAL/ EF 49%/ GLOBAL HYPOKINESIS/ APPEARS TO BE A MILD ANTERIOR PERFUSION DEFECT COULD REPRESENT ISCHEMIA BUT DUE TO  ACTIVITY WORSE IN STRESS THAN AT REST POSS. THE DEFECT WAS ARTIFACTUAL  . Transthoracic echocardiogram  [id number]    MILD SYSTOLIC DYSFUNCTION/ EF [id number] MODERATE DIASTOLIC DYSFUCTION/ MILD MR/ MILD BIATRIAL ENLARGEMENT  . Ct angiogram  FEB 2011    CALCIUM SCORE 161/ POSS. MODERATE MID LAD  STENOSIS  . Cystoscopy w/ ureteral stent removal  04/28/2011    Procedure: CYSTOSCOPY WITH STENT REMOVAL;  Surgeon: Fredricka Bonine, MD;  Location: [facility];  Service: Urology;;  . Cystoscopy w/ retrogrades  04/28/2011    Procedure: CYSTOSCOPY WITH RETROGRADE PYELOGRAM;  Surgeon: Fredricka Bonine, MD;  Location: [facility];  Service: Urology;  Laterality: Left;   History   Social History  . Marital Status: Married    Spouse Name: N/A    Number of Children: 3   Occupational History  . retired   Social History Main Topics  . Smoking status: Former Smoker -- 2.00 packs/day for 52 years    Types: Cigarettes    Quit date: 03/24/2008  . Smokeless tobacco: Never Used  . Alcohol Use: Yes     Comment: RARE  . Drug Use: No   Current Outpatient Prescriptions on File Prior to Visit  Medication Sig Dispense Refill  . albuterol (PROVENTIL HFA;VENTOLIN HFA) 108 (90 BASE) MCG/ACT inhaler Inhale 2 puffs into the lungs every 4 (four) hours as needed for wheezing or shortness of breath.  1 Inhaler  0  . ALPRAZolam (XANAX) 1 MG tablet Take 1 tablet (1 mg total) by mouth 2 (two) times daily.  60 tablet  5  . b complex vitamins capsule Take by mouth.      . beta carotene w/minerals (OCUVITE) tablet Take 1 tablet by mouth daily.      . Biotin 5000 MCG CAPS Take 5 mg by mouth.      Marland Kitchen buPROPion (WELLBUTRIN XL) 300 MG 24 hr tablet take 1 tablet by mouth once daily  90 tablet  3  . chlorhexidine (PERIDEX) 0.12 % solution 5 mLs by Mouth Rinse route Twice daily.      . Cholecalciferol (VITAMIN D) 2000 UNITS CAPS Take 2,000 Int'l Units by mouth.      . citalopram (CELEXA) 40 MG tablet take 1 tablet by mouth once daily  90 tablet  3  . co-enzyme Q-10 30 MG capsule Take 30 mg by mouth daily.       Marland Kitchen diltiazem (CARDIZEM CD) 180 MG 24 hr capsule Take 180 mg by mouth daily.      . fluticasone (FLONASE) 50 MCG/ACT nasal spray instill 2 sprays into each nostril once  daily  16 g  6  . lisinopril (PRINIVIL,ZESTRIL) 5 MG tablet take 1 tablet by mouth once daily  90 tablet  3  . loratadine (CLARITIN) 10 MG tablet Take 10 mg by mouth daily.       Marland Kitchen Lysine 500 MG TABS Take 500 mg by mouth daily.      Marland Kitchen NITROSTAT 0.4 MG SL tablet PLACE 1 TAB UNDER TONGUE.Marland Kitchen TAKE TABS 5 MINS APART.. UP TO 3 TABS  25 each  12  . omeprazole (PRILOSEC) 20 MG capsule Take 20 mg by mouth every morning.      Marland Kitchen oxyCODONE (OXY IR/ROXICODONE) 5 MG immediate release tablet       .  Pitavastatin Calcium (LIVALO) 2 MG TABS take 1 tablet by mouth once daily  90 tablet  3  . predniSONE (DELTASONE) 10 MG tablet Taper as follows: 4-4-4-3-3-2-2  22 tablet  0  . warfarin (COUMADIN) 2.5 MG tablet take as directed ON MONDAY, WEDNESDAY, AND FRIDAY  45 tablet  0  . warfarin (COUMADIN) 5 MG tablet Take 5 mg daily except 7.5 mg on MWF or as directed  90 tablet  3   No current facility-administered medications on file prior to visit.   Allergies  Allergen Reactions  . Chlorhexidine Base Itching    CHG WIPES  . Penicillins Hives and Rash   Family History  Problem Relation Age of Onset  . Arthritis Mother   . Heart disease Mother   . Diabetes Sister   . Hypertension Sister   . Stroke Sister    PE: BP 114/72  Pulse 98  Temp(Src) 98.4 F (36.9 C) (Oral)  Resp 12  Ht 5' 6.5" (1.689 m)  Wt 244 lb 12.8 oz (111.041 kg)  BMI 38.92 kg/m2  SpO2 95% Wt Readings from Last 3 Encounters:  07/10/13 244 lb 12.8 oz (111.041 kg)  06/13/13 247 lb (112.038 kg)  06/09/13 247 lb (112.038 kg)   Constitutional: obese, in NAD Eyes: PERRLA, EOMI, no exophthalmos ENT: moist mucous membranes, no thyromegaly, no cervical lymphadenopathy Cardiovascular: RRR, No MRG Respiratory: CTA B Gastrointestinal: abdomen soft, NT, ND, BS+ Musculoskeletal: no deformities, strength intact in all 4 Skin: moist, warm, no rashes; thin skin Neurological: no tremor with outstretched hands, DTR normal in all 4  ASSESSMENT: 1.  Fatigue  2. Hair loss  PLAN:  1. And 2. Patient with several sxs that can be related to hypothyroidism: fatigue, hair loss, weight gain - She does not appear to have a goiter, thyroid nodules, or neck compression symptoms - willl check thyroid tests today: TSH, free T4, free T3 - will also add vitamin B12 and D levels and a CBC to check for anemia - discussed about the need to reduce stress - for her hair loss, if the above labs are normal >> I suggested to see a dermatologist - I will see her back if labs are abnormal  Office Visit on 07/10/2013  Component Date Value Ref Range Status  . TSH 07/10/2013 1.27  0.35 - 5.50 uIU/mL Final  . Free T4 07/10/2013 1.13  0.60 - 1.60 ng/dL Final  . T3, Free 07/10/2013 2.3  2.3 - 4.2 pg/mL Final  . Vitamin B-12 07/10/2013 654  211 - 911 pg/mL Final  . Vit D, 25-Hydroxy 07/10/2013 38  30 - 89 ng/mL Final   Comment: This assay accurately quantifies Vitamin D, which is the sum of the                          25-Hydroxy forms of Vitamin D2 and D3.  Studies have shown that the                          optimum concentration of 25-Hydroxy Vitamin D is 30 ng/mL or higher.                           Concentrations of Vitamin D between 20 and 29 ng/mL are considered to  be insufficient and concentrations less than 20 ng/mL are considered                          to be deficient for Vitamin D.  . WBC 07/10/2013 6.2  4.5 - 10.5 K/uL Final  . RBC 07/10/2013 4.37  3.87 - 5.11 Mil/uL Final  . Hemoglobin 07/10/2013 13.3  12.0 - 15.0 g/dL Final  . HCT 07/10/2013 39.7  36.0 - 46.0 % Final  . MCV 07/10/2013 90.8  78.0 - 100.0 fl Final  . MCHC 07/10/2013 33.5  30.0 - 36.0 g/dL Final  . RDW 07/10/2013 16.6* 11.5 - 14.6 % Final  . Platelets 07/10/2013 217.0  150.0 - 400.0 K/uL Final  . Neutrophils Relative % 07/10/2013 57.7  43.0 - 77.0 % Final  . Lymphocytes Relative 07/10/2013 31.4  12.0 - 46.0 % Final  . Monocytes Relative 07/10/2013 9.2   3.0 - 12.0 % Final  . Eosinophils Relative 07/10/2013 1.1  0.0 - 5.0 % Final  . Basophils Relative 07/10/2013 0.6  0.0 - 3.0 % Final  . Neutro Abs 07/10/2013 3.6  1.4 - 7.7 K/uL Final  . Lymphs Abs 07/10/2013 2.0  0.7 - 4.0 K/uL Final  . Monocytes Absolute 07/10/2013 0.6  0.1 - 1.0 K/uL Final  . Eosinophils Absolute 07/10/2013 0.1  0.0 - 0.7 K/uL Final  . Basophils Absolute 07/10/2013 0.0  0.0 - 0.1 K/uL Final   No anemia, vitamin B12 or vitamin D deficiency. TFTs normal.

## 2013-07-10 NOTE — Patient Instructions (Signed)
Please stop at the lab. We will schedule an appointment if the labs are abnormal.

## 2013-07-11 LAB — VITAMIN D 25 HYDROXY (VIT D DEFICIENCY, FRACTURES): Vit D, 25-Hydroxy: 38 ng/mL (ref 30–89)

## 2013-07-12 ENCOUNTER — Telehealth: Payer: Self-pay | Admitting: Internal Medicine

## 2013-07-12 NOTE — Telephone Encounter (Signed)
Pt calling for lab results.

## 2013-07-14 ENCOUNTER — Other Ambulatory Visit: Payer: Self-pay | Admitting: Internal Medicine

## 2013-08-04 ENCOUNTER — Ambulatory Visit: Payer: Medicare Other

## 2013-08-04 ENCOUNTER — Other Ambulatory Visit: Payer: Medicare Other

## 2013-08-07 ENCOUNTER — Other Ambulatory Visit (HOSPITAL_BASED_OUTPATIENT_CLINIC_OR_DEPARTMENT_OTHER): Payer: Medicare Other

## 2013-08-07 ENCOUNTER — Ambulatory Visit (HOSPITAL_BASED_OUTPATIENT_CLINIC_OR_DEPARTMENT_OTHER): Payer: Self-pay | Admitting: Pharmacist

## 2013-08-07 DIAGNOSIS — I82409 Acute embolism and thrombosis of unspecified deep veins of unspecified lower extremity: Secondary | ICD-10-CM

## 2013-08-07 LAB — PROTIME-INR
INR: 2.1 (ref 2.00–3.50)
Protime: 25.2 Seconds — ABNORMAL HIGH (ref 10.6–13.4)

## 2013-08-07 NOTE — Patient Instructions (Signed)
Continue taking Coumadin 5mg  daily except for 7.5mg  on MWF. Recheck INR in 6 weeks on 09/20/13; lab at 2:00pm and Coumadin clinic at 2:15pm.

## 2013-08-07 NOTE — Progress Notes (Signed)
Pt seen in clinic today INR=2.1 No changes to report. She admits to missing one dose since last visit.   No refills needed this visit. Continue taking Coumadin 5mg  daily except for 7.5mg  on MWF.  Recheck INR in 6 weeks on 09/20/13; lab at 2:00pm and Coumadin clinic at 2:15pm.

## 2013-08-21 ENCOUNTER — Encounter: Payer: Self-pay | Admitting: Internal Medicine

## 2013-09-20 ENCOUNTER — Ambulatory Visit: Payer: Medicare Other

## 2013-09-20 ENCOUNTER — Other Ambulatory Visit: Payer: Medicare Other

## 2013-09-21 ENCOUNTER — Ambulatory Visit (HOSPITAL_BASED_OUTPATIENT_CLINIC_OR_DEPARTMENT_OTHER): Payer: Self-pay | Admitting: Pharmacist

## 2013-09-21 ENCOUNTER — Other Ambulatory Visit (HOSPITAL_BASED_OUTPATIENT_CLINIC_OR_DEPARTMENT_OTHER): Payer: Medicare Other

## 2013-09-21 DIAGNOSIS — I82409 Acute embolism and thrombosis of unspecified deep veins of unspecified lower extremity: Secondary | ICD-10-CM

## 2013-09-21 LAB — PROTIME-INR
INR: 2 (ref 2.00–3.50)
Protime: 24 Seconds — ABNORMAL HIGH (ref 10.6–13.4)

## 2013-09-21 LAB — POCT INR: INR: 2

## 2013-09-21 NOTE — Patient Instructions (Signed)
INR at goal No changes Continue taking Coumadin 5mg  daily except for 7.5mg  on MWF.  Recheck INR in 6 weeks on 10/31/13; lab at 2:00pm and Coumadin clinic at 2:15pm.

## 2013-09-21 NOTE — Progress Notes (Signed)
INR at goal Pt is doing well with no complaints No unusual issues regarding bleeding or bruising No missed or extra doses No medication or diet changes Plan: No changes Continue taking Coumadin 5mg  daily except for 7.5mg  on MWF.  Recheck INR in 6 weeks on 10/31/13; lab at 2:00pm and Coumadin clinic at 2:15pm.

## 2013-10-15 ENCOUNTER — Other Ambulatory Visit: Payer: Self-pay | Admitting: Internal Medicine

## 2013-10-30 NOTE — Telephone Encounter (Signed)
Phone call - encounter closed. 

## 2013-10-31 ENCOUNTER — Other Ambulatory Visit (HOSPITAL_BASED_OUTPATIENT_CLINIC_OR_DEPARTMENT_OTHER): Payer: Medicare Other

## 2013-10-31 ENCOUNTER — Ambulatory Visit (HOSPITAL_BASED_OUTPATIENT_CLINIC_OR_DEPARTMENT_OTHER): Payer: Self-pay | Admitting: Pharmacist

## 2013-10-31 DIAGNOSIS — I82409 Acute embolism and thrombosis of unspecified deep veins of unspecified lower extremity: Secondary | ICD-10-CM

## 2013-10-31 DIAGNOSIS — I82402 Acute embolism and thrombosis of unspecified deep veins of left lower extremity: Secondary | ICD-10-CM

## 2013-10-31 LAB — PROTIME-INR
INR: 2 (ref 2.00–3.50)
Protime: 24 Seconds — ABNORMAL HIGH (ref 10.6–13.4)

## 2013-10-31 LAB — POCT INR: INR: 2

## 2013-10-31 NOTE — Patient Instructions (Signed)
Continue taking Coumadin 5mg  daily except for 7.5mg  on MWF.  Recheck INR in 5 weeks on 12/08/13; lab at 2:00pm and Coumadin clinic at 2:15pm.

## 2013-10-31 NOTE — Progress Notes (Signed)
INR within goal today. No problems or concerns regarding anticoagulation. Pt missed one dose of her coumadin in the last week. No changes in diet or medications. No s/s of clotting. No unusual bruising. No bleeding noted. Continue taking Coumadin 5mg  daily except for 7.5mg  on MWF.  Recheck INR in 5 weeks on 12/08/13; lab at 2:00pm and Coumadin clinic at 2:15pm. CBC ordered for review at next visit.

## 2013-11-09 ENCOUNTER — Telehealth: Payer: Self-pay | Admitting: Family Medicine

## 2013-11-09 NOTE — Telephone Encounter (Signed)
Pt states there are no more refills on her ALPRAZolam (XANAX) 1 MG tablet Even though pt got filled 05/24/13 w/ 5 refills. Pharm refused to fill.  [facility]/ [address]

## 2013-11-10 ENCOUNTER — Encounter: Payer: Self-pay | Admitting: *Deleted

## 2013-11-10 ENCOUNTER — Encounter: Payer: Self-pay | Admitting: Cardiology

## 2013-11-10 ENCOUNTER — Ambulatory Visit (INDEPENDENT_AMBULATORY_CARE_PROVIDER_SITE_OTHER): Payer: Medicare Other | Admitting: Cardiology

## 2013-11-10 VITALS — BP 120/70 | HR 70 | Wt 244.0 lb

## 2013-11-10 DIAGNOSIS — I2510 Atherosclerotic heart disease of native coronary artery without angina pectoris: Secondary | ICD-10-CM

## 2013-11-10 DIAGNOSIS — I059 Rheumatic mitral valve disease, unspecified: Secondary | ICD-10-CM

## 2013-11-10 DIAGNOSIS — I4891 Unspecified atrial fibrillation: Secondary | ICD-10-CM

## 2013-11-10 DIAGNOSIS — R0602 Shortness of breath: Secondary | ICD-10-CM

## 2013-11-10 DIAGNOSIS — I428 Other cardiomyopathies: Secondary | ICD-10-CM

## 2013-11-10 DIAGNOSIS — I480 Paroxysmal atrial fibrillation: Secondary | ICD-10-CM

## 2013-11-10 DIAGNOSIS — E785 Hyperlipidemia, unspecified: Secondary | ICD-10-CM

## 2013-11-10 MED ORDER — ALPRAZOLAM 1 MG PO TABS: 1.0000 mg | ORAL_TABLET | Freq: Two times a day (BID) | ORAL | Status: DC

## 2013-11-10 NOTE — Telephone Encounter (Signed)
RX called into pharmacy

## 2013-11-10 NOTE — Telephone Encounter (Signed)
Last visit 06/13/13 Last refill 05/24/13 #60 5 refill

## 2013-11-10 NOTE — Telephone Encounter (Signed)
Refill for 6 months. 

## 2013-11-10 NOTE — Patient Instructions (Signed)
Your physician recommends that you return for a FASTING lipid profile/BMET/CBCd  Your physician has requested that you have an echocardiogram. Echocardiography is a painless test that uses sound waves to create images of your heart. It provides your doctor with information about the size and shape of your heart and how well your heart's chambers and valves are working. This procedure takes approximately one hour. There are no restrictions for this procedure.  Your physician wants you to follow-up in: 6 months with Dr Aundra Dubin. (February 2016).  You will receive a reminder letter in the mail two months in advance. If you don't receive a letter, please call our office to schedule the follow-up appointment.

## 2013-11-12 NOTE — Progress Notes (Signed)
Patient ID: [name], female   DOB: 08/22/1945, 68 y.o.   MRN: [id number] PCP: Dr. Elease Hashimoto  68 yo with history of retroperitoneal fibrosis and non-Hodgkin's lymphoma as well as prior DVT presented initially for evaluation of an abnormal myoview.  A Lexiscan myoview was done in 1/11. This was an equivocal study due to subdiaphragmatic nuclear uptake. EF was calculated to be 49% with global hypokinesis. Given the equivocal myoview, I had her do a coronary CT angiogram. This showed a moderate calcium score of 161 (putting her at a high risk percentile for her age) and a possible moderate mid LAD stenosis (though some degradation of images by respiratory artifact). Echo was done to reassess LV systolic function (reported as mildly decreased on myoview). This confirmed mild LV systolic dysfunction, EF 42-35% with moderate diastolic dysfunction. We discussed catheterization, but patient strongly preferred medical treatment.   Since may last appointment with her, [name] was diagnosed with paroxysmal atrial fibrillation.  She was started on warfarin and diltiazem CD.  She has had only rare flutters since starting diltiazem.  She is in NSR today.  She continues to have a lot of stress at home.  She has chronic dyspnea with moderate exertion.  No chest pain.  She has chronic fatigue.  Last echo in 11/14 showed stable EF 50-55% but there was moderate mitral stenosis reported.  I do not have the images to assess (was done at outside facility).    Labs (12/10): HCT 34.5, K 4.3, creatinine 1.2, LDL 101, HDL 46  Labs (7/11): LFTs normal, LDL 69, HDL 47, BNP 21, K 4.3, creatinine 1.2  Labs (1/12): K 5.2, creatinine 1.0, LFTs normal, LDL 59, HDL 46  Labs (6/13): LDL 54, HDL 52, K 4.7, creatinine 1.1 Labs (9/14): LDL 79, HDL 56 Labs (4/15): TSH normal  Past Medical History:   1. Anxiety Disorder  2. Arthritis  3. Depression  4. Retroperitoneal fibrosis with non-Hodgkin's lymphoma. This was treated  with chemotherapy in 2006 and is in remission.  5. Adenomatous Colon Polyps  6. Diverticulosis  7. DVT left leg/thigh: in both 2005 and 2006, associated with lymphoma. She is on chronic coumadin.  8. Obesity  9. Left ureteral stent (ureter involved by retroperitoneal fibrosis).  10. CAD: Lexiscan myoview (1/11): EF 49% with global hypokinesis. There appeared to be a mild anterior perfusion defect that could represent ischemia. However, there was significant subdiaphragmatic nuclear activity that was worse in stress than rest so it is possible that the defect was artifactual. This study was equivocal. Coronary CT angiogram was done to follow this up in 2/11. This showed calcium score of 161 (86th percentile for her age and race) and a possible moderate mid LAD stenosis.  11. Mild systolic dysfunction: EF 36% on myoview. Echo (2/11): EF 45-50%, moderate diastolic dysfunction, mild MR, mild biatrial enlargement, PASP 34 mmHg. It is possible that this could be chemotherapy-related.  Echo (11/14): EF 50-55%, grade II diastolic dysfunction, ?moderate mitral stenosis, no MR.  12. Hyperlipidemia: myalgias with Crestor.  13. Atrial fibrillation: Paroxysmal.   Family History:  Family History of Arthritis mother  Family History Diabetes 1st degree relative deceased sister  Family History Hypertension deceased sister  Family History of Stroke F 1st degree relative <60 deceased sister  Family History of Cardiovascular disorder mother  Sister had MI and CVA at 43  Mother with CABG in her 14s   Social History:  Retired  Married  Quit smoking 5/10  Alcohol use-yes, rarely  Regular exercise-no   Review of Systems  All systems reviewed and negative except as per HPI.   Current Outpatient Prescriptions  Medication Sig Dispense Refill  . albuterol (PROVENTIL HFA;VENTOLIN HFA) 108 (90 BASE) MCG/ACT inhaler Inhale 2 puffs into the lungs every 4 (four) hours as needed for wheezing or shortness of breath.  1  Inhaler  0  . ALPRAZolam (XANAX) 1 MG tablet Take 1 tablet (1 mg total) by mouth 2 (two) times daily.  60 tablet  5  . b complex vitamins capsule Take by mouth.      . beta carotene w/minerals (OCUVITE) tablet Take 1 tablet by mouth daily.      . Biotin 5000 MCG CAPS Take 5 mg by mouth.      Marland Kitchen buPROPion (WELLBUTRIN XL) 300 MG 24 hr tablet take 1 tablet by mouth once daily  90 tablet  3  . chlorhexidine (PERIDEX) 0.12 % solution 5 mLs by Mouth Rinse route Twice daily.      . Cholecalciferol (VITAMIN D) 2000 UNITS CAPS Take 2,000 Int'l Units by mouth.      . citalopram (CELEXA) 40 MG tablet take 1 tablet by mouth once daily  90 tablet  3  . co-enzyme Q-10 30 MG capsule Take 30 mg by mouth daily.       Marland Kitchen diltiazem (CARDIZEM CD) 180 MG 24 hr capsule Take 180 mg by mouth daily.      . fluticasone (FLONASE) 50 MCG/ACT nasal spray instill 2 sprays into each nostril once daily  16 g  6  . latanoprost (XALATAN) 0.005 % ophthalmic solution       . lisinopril (PRINIVIL,ZESTRIL) 5 MG tablet take 1 tablet by mouth once daily  90 tablet  3  . loratadine (CLARITIN) 10 MG tablet Take 10 mg by mouth daily.       Marland Kitchen Lysine 500 MG TABS Take 500 mg by mouth daily.      Marland Kitchen NITROSTAT 0.4 MG SL tablet PLACE 1 TAB UNDER TONGUE.Marland Kitchen TAKE TABS 5 MINS APART.. UP TO 3 TABS  25 each  12  . omeprazole (PRILOSEC) 20 MG capsule Take 20 mg by mouth every morning.      Marland Kitchen oxyCODONE (OXY IR/ROXICODONE) 5 MG immediate release tablet       . Pitavastatin Calcium (LIVALO) 2 MG TABS take 1 tablet by mouth once daily  90 tablet  3  . warfarin (COUMADIN) 2.5 MG tablet take as directed ON MONDAY, WEDNESDAY, AND FRIDAY  45 tablet  0  . warfarin (COUMADIN) 5 MG tablet Take 5 mg daily except 7.5 mg on MWF or as directed  90 tablet  3   No current facility-administered medications for this visit.   BP 120/70  Pulse 70  Wt 110.678 kg (244 lb) General: NAD, obese.  Neck: No JVD, no thyromegaly or thyroid nodule.  Lungs: Clear to  auscultation bilaterally with normal respiratory effort. CV: Nondisplaced PMI.  Heart regular S1/S2, no S3/S4, no murmur.  No peripheral edema.  No carotid bruit.  Normal pedal pulses.  Abdomen: Soft, nontender, no hepatosplenomegaly, no distention.  Neurologic: Alert and oriented x 3.  Psych: Normal affect. Extremities: No clubbing or cyanosis.   Assessment/Plan: 1. Atrial fibrillation: Paroxysmal.  She is in NSR today and has had minimal palpitations.  Continue diltiazem CD and warfarin. Check CBC on warfarin.  2. CAD: She is on warfarin so not on ASA.  No chest pain.  Stable exertional dyspnea.  Continue statin.  3. Hyperlipidemia: Check lipids today. Goal LDL < 70 given known CAD from coronary CTA in past.  4. Mitral stenosis: Reported on echo from outside lab.  No murmur on exam.  I wonder if this could be overcalled? I will arrange for repeat echocardiogram to assess the mitral valve.   Loralie Champagne 11/12/2013

## 2013-11-13 DIAGNOSIS — I050 Rheumatic mitral stenosis: Secondary | ICD-10-CM | POA: Insufficient documentation

## 2013-11-13 DIAGNOSIS — I4821 Permanent atrial fibrillation: Secondary | ICD-10-CM | POA: Insufficient documentation

## 2013-11-15 ENCOUNTER — Other Ambulatory Visit (INDEPENDENT_AMBULATORY_CARE_PROVIDER_SITE_OTHER): Payer: Medicare Other

## 2013-11-15 ENCOUNTER — Ambulatory Visit (HOSPITAL_COMMUNITY): Payer: Medicare Other | Attending: Cardiology | Admitting: Cardiology

## 2013-11-15 DIAGNOSIS — R0602 Shortness of breath: Secondary | ICD-10-CM

## 2013-11-15 DIAGNOSIS — R0989 Other specified symptoms and signs involving the circulatory and respiratory systems: Principal | ICD-10-CM | POA: Insufficient documentation

## 2013-11-15 DIAGNOSIS — E785 Hyperlipidemia, unspecified: Secondary | ICD-10-CM

## 2013-11-15 DIAGNOSIS — I428 Other cardiomyopathies: Secondary | ICD-10-CM

## 2013-11-15 DIAGNOSIS — E669 Obesity, unspecified: Secondary | ICD-10-CM | POA: Diagnosis not present

## 2013-11-15 DIAGNOSIS — R0609 Other forms of dyspnea: Secondary | ICD-10-CM | POA: Insufficient documentation

## 2013-11-15 DIAGNOSIS — I059 Rheumatic mitral valve disease, unspecified: Secondary | ICD-10-CM | POA: Diagnosis not present

## 2013-11-15 DIAGNOSIS — Z87891 Personal history of nicotine dependence: Secondary | ICD-10-CM | POA: Diagnosis not present

## 2013-11-15 DIAGNOSIS — I2510 Atherosclerotic heart disease of native coronary artery without angina pectoris: Secondary | ICD-10-CM | POA: Diagnosis not present

## 2013-11-15 DIAGNOSIS — I4891 Unspecified atrial fibrillation: Secondary | ICD-10-CM | POA: Diagnosis not present

## 2013-11-15 LAB — CBC WITH DIFFERENTIAL/PLATELET
BASOS PCT: 0.3 % (ref 0.0–3.0)
Basophils Absolute: 0 10*3/uL (ref 0.0–0.1)
EOS ABS: 0.1 10*3/uL (ref 0.0–0.7)
EOS PCT: 1.7 % (ref 0.0–5.0)
HCT: 38.3 % (ref 36.0–46.0)
HEMOGLOBIN: 12.5 g/dL (ref 12.0–15.0)
Lymphocytes Relative: 26.7 % (ref 12.0–46.0)
Lymphs Abs: 1.3 10*3/uL (ref 0.7–4.0)
MCHC: 32.7 g/dL (ref 30.0–36.0)
MCV: 88.6 fl (ref 78.0–100.0)
Monocytes Absolute: 0.4 10*3/uL (ref 0.1–1.0)
Monocytes Relative: 9.4 % (ref 3.0–12.0)
NEUTROS ABS: 2.9 10*3/uL (ref 1.4–7.7)
Neutrophils Relative %: 61.9 % (ref 43.0–77.0)
Platelets: 194 10*3/uL (ref 150.0–400.0)
RBC: 4.32 Mil/uL (ref 3.87–5.11)
RDW: 13.6 % (ref 11.5–15.5)
WBC: 4.7 10*3/uL (ref 4.0–10.5)

## 2013-11-15 LAB — BASIC METABOLIC PANEL
BUN: 15 mg/dL (ref 6–23)
CO2: 27 meq/L (ref 19–32)
CREATININE: 1.2 mg/dL (ref 0.4–1.2)
Calcium: 9.3 mg/dL (ref 8.4–10.5)
Chloride: 105 mEq/L (ref 96–112)
GFR: 48.65 mL/min — ABNORMAL LOW (ref >60.00)
Glucose, Bld: 139 mg/dL — ABNORMAL HIGH (ref 70–99)
POTASSIUM: 4.3 meq/L (ref 3.5–5.1)
Sodium: 141 mEq/L (ref 135–145)

## 2013-11-15 LAB — LIPID PANEL
CHOLESTEROL: 137 mg/dL (ref 0–200)
HDL: 57.7 mg/dL (ref >39.00)
LDL Cholesterol: 58 mg/dL (ref 0–99)
NonHDL: 79.3
TRIGLYCERIDES: 105 mg/dL (ref 0.0–149.0)
Total CHOL/HDL Ratio: 2
VLDL: 21 mg/dL (ref 0.0–40.0)

## 2013-11-15 NOTE — Progress Notes (Signed)
Echo performed. 

## 2013-12-08 ENCOUNTER — Other Ambulatory Visit: Payer: Medicare Other

## 2013-12-08 ENCOUNTER — Telehealth: Payer: Self-pay

## 2013-12-08 ENCOUNTER — Telehealth: Payer: Self-pay | Admitting: Hematology

## 2013-12-08 ENCOUNTER — Ambulatory Visit: Payer: Medicare Other

## 2013-12-08 NOTE — Telephone Encounter (Signed)
Called [name] to reschedule her Coumadin Clinic appointment. She was originally scheduled for today, but had to cancel due to a migraine. She stated she will call back Monday to reschedule.

## 2013-12-08 NOTE — Telephone Encounter (Signed)
returned pt call and pt has migraine and did not want to r/s at this time

## 2013-12-18 ENCOUNTER — Ambulatory Visit: Payer: Medicare Other

## 2013-12-18 ENCOUNTER — Other Ambulatory Visit: Payer: Medicare Other

## 2013-12-18 ENCOUNTER — Telehealth: Payer: Self-pay | Admitting: Pharmacist

## 2013-12-18 NOTE — Telephone Encounter (Signed)
Pt thought her appt for lab/CC was tomorrow instead of today & she FTKA today. At her request, I have resched to tomorrow at 2:30 pm for lab; 2:45 pm for CC. Kennith Center, Pharm.D., CPP 12/18/2013@3 :45 PM

## 2013-12-19 ENCOUNTER — Ambulatory Visit (HOSPITAL_BASED_OUTPATIENT_CLINIC_OR_DEPARTMENT_OTHER): Payer: Self-pay | Admitting: Pharmacist

## 2013-12-19 ENCOUNTER — Other Ambulatory Visit (HOSPITAL_BASED_OUTPATIENT_CLINIC_OR_DEPARTMENT_OTHER): Payer: Medicare Other

## 2013-12-19 DIAGNOSIS — I82409 Acute embolism and thrombosis of unspecified deep veins of unspecified lower extremity: Secondary | ICD-10-CM | POA: Diagnosis not present

## 2013-12-19 DIAGNOSIS — I82402 Acute embolism and thrombosis of unspecified deep veins of left lower extremity: Secondary | ICD-10-CM

## 2013-12-19 LAB — PROTIME-INR
INR: 2.9 (ref 2.00–3.50)
PROTIME: 34.8 s — AB (ref 10.6–13.4)

## 2013-12-19 LAB — POCT INR: INR: 2.9

## 2013-12-19 NOTE — Patient Instructions (Signed)
Continue taking Coumadin 5mg  daily except for 7.5mg  on MWF.  Recheck INR in 5 weeks on 01/23/14; lab at 2:30pm and Coumadin clinic at 2:45pm.

## 2013-12-19 NOTE — Progress Notes (Signed)
INR within goal today. No problems or concerns regarding anticoagulation. Pt likely missed one coumadin dose since her last visit. The missed dose was > 1 week ago (Pt couldn't remember). No extra coumadin doses. No changes in diet or medications. No unusual bruising or bleeding. No s/s of clotting noted. Continue taking Coumadin 5mg  daily except for 7.5mg  on MWF.  Recheck INR in 5 weeks on 01/23/14; lab at 2:30pm and Coumadin clinic at 2:45pm.

## 2013-12-28 ENCOUNTER — Other Ambulatory Visit: Payer: Self-pay | Admitting: Cardiology

## 2014-01-22 ENCOUNTER — Other Ambulatory Visit: Payer: Self-pay | Admitting: *Deleted

## 2014-01-22 ENCOUNTER — Other Ambulatory Visit: Payer: Self-pay | Admitting: Internal Medicine

## 2014-01-22 DIAGNOSIS — I82409 Acute embolism and thrombosis of unspecified deep veins of unspecified lower extremity: Secondary | ICD-10-CM

## 2014-01-23 ENCOUNTER — Ambulatory Visit (HOSPITAL_BASED_OUTPATIENT_CLINIC_OR_DEPARTMENT_OTHER): Payer: Medicare Other | Admitting: Pharmacist

## 2014-01-23 ENCOUNTER — Other Ambulatory Visit (HOSPITAL_BASED_OUTPATIENT_CLINIC_OR_DEPARTMENT_OTHER): Payer: Medicare Other

## 2014-01-23 DIAGNOSIS — I82402 Acute embolism and thrombosis of unspecified deep veins of left lower extremity: Secondary | ICD-10-CM

## 2014-01-23 DIAGNOSIS — I82409 Acute embolism and thrombosis of unspecified deep veins of unspecified lower extremity: Secondary | ICD-10-CM

## 2014-01-23 LAB — POCT INR: INR: 2.1

## 2014-01-23 LAB — PROTIME-INR
INR: 2.1 (ref 2.00–3.50)
PROTIME: 25.2 s — AB (ref 10.6–13.4)

## 2014-01-23 NOTE — Progress Notes (Signed)
INR continues to be at goal of 2-3.  No changes in meds.  No bleeding/bruising.  No other medical changes reported by Ms Herminio Heads.  Will continue coumadin 7.5mg  MWF and 5mg  other days.  Will check PT/INR in 6 weeks.

## 2014-01-25 ENCOUNTER — Telehealth: Payer: Self-pay | Admitting: Family Medicine

## 2014-01-25 ENCOUNTER — Other Ambulatory Visit: Payer: Self-pay | Admitting: *Deleted

## 2014-01-25 DIAGNOSIS — L659 Nonscarring hair loss, unspecified: Secondary | ICD-10-CM

## 2014-01-25 DIAGNOSIS — R5383 Other fatigue: Secondary | ICD-10-CM

## 2014-01-25 DIAGNOSIS — I82402 Acute embolism and thrombosis of unspecified deep veins of left lower extremity: Secondary | ICD-10-CM

## 2014-01-25 MED ORDER — WARFARIN SODIUM 5 MG PO TABS: ORAL_TABLET | ORAL | Status: DC

## 2014-01-25 NOTE — Telephone Encounter (Signed)
Pt aware.

## 2014-01-25 NOTE — Telephone Encounter (Signed)
She can get shingles vaccine if no contraindications. Cannot answer questions regarding labs without seeing what they are recommending

## 2014-01-25 NOTE — Telephone Encounter (Signed)
Had shingles last year and wants to know if she can have a shingles shot this year? Also having tests run with an integrative MD and he wants to have some labs done on her. They do not file insurance and she would like to know if you would order the labs and she can have them done here.

## 2014-01-25 NOTE — Telephone Encounter (Signed)
Informed if she could fax over what labs need to be done.

## 2014-01-30 ENCOUNTER — Other Ambulatory Visit: Payer: Self-pay | Admitting: Internal Medicine

## 2014-02-01 NOTE — Telephone Encounter (Signed)
Pt does not have official lab order however they wrote on piece of paper the labs they are recommending d2504 or D250H and calcitrol,lipid panel,a1c, cmp,, cbc,tsh ,t4 and t3. Can I sch. Pt is experiencing hair loss

## 2014-02-01 NOTE — Telephone Encounter (Signed)
i am not familiar with the first two labs listed.  I would prefer the "integrative" physician/provider get these drawn since these are not standard labs.

## 2014-02-02 NOTE — Addendum Note (Signed)
Addended by: Westley Hummer B on: 02/02/2014 05:31 PM   Modules accepted: Orders

## 2014-02-02 NOTE — Telephone Encounter (Signed)
I spoke with the patient and she would like to know if she can just have the "routine" labs done here because the integrative physician does not accept insurance.  Okay to order lipid panel,a1c, cmp,, cbc,tsh ,t4 and t3 due to hair loss?

## 2014-02-02 NOTE — Telephone Encounter (Signed)
Patient has a lab appointment and is aware that insurance may not pay for all of the test.

## 2014-02-05 ENCOUNTER — Other Ambulatory Visit: Payer: Self-pay | Admitting: Family Medicine

## 2014-02-06 ENCOUNTER — Other Ambulatory Visit (INDEPENDENT_AMBULATORY_CARE_PROVIDER_SITE_OTHER): Payer: Medicare Other

## 2014-02-06 DIAGNOSIS — L659 Nonscarring hair loss, unspecified: Secondary | ICD-10-CM

## 2014-02-06 DIAGNOSIS — R5383 Other fatigue: Secondary | ICD-10-CM

## 2014-02-06 LAB — COMPREHENSIVE METABOLIC PANEL
ALT: 17 U/L (ref 0–35)
AST: 23 U/L (ref 0–37)
Albumin: 4 g/dL (ref 3.5–5.2)
Alkaline Phosphatase: 62 U/L (ref 39–117)
BILIRUBIN TOTAL: 0.5 mg/dL (ref 0.2–1.2)
BUN: 18 mg/dL (ref 6–23)
CALCIUM: 9.4 mg/dL (ref 8.4–10.5)
CHLORIDE: 108 meq/L (ref 96–112)
CO2: 22 meq/L (ref 19–32)
Creatinine, Ser: 1.2 mg/dL (ref 0.4–1.2)
GFR: 47.68 mL/min — AB (ref >60.00)
GLUCOSE: 152 mg/dL — AB (ref 70–99)
Potassium: 5.2 mEq/L — ABNORMAL HIGH (ref 3.5–5.1)
Sodium: 140 mEq/L (ref 135–145)
TOTAL PROTEIN: 7 g/dL (ref 6.0–8.3)

## 2014-02-06 LAB — LIPID PANEL
CHOLESTEROL: 149 mg/dL (ref 0–200)
HDL: 63.4 mg/dL (ref >39.00)
LDL CALC: 69 mg/dL (ref 0–99)
NonHDL: 85.6
TRIGLYCERIDES: 85 mg/dL (ref 0.0–149.0)
Total CHOL/HDL Ratio: 2
VLDL: 17 mg/dL (ref 0.0–40.0)

## 2014-02-06 LAB — CBC
HCT: 38 % (ref 36.0–46.0)
Hemoglobin: 12.3 g/dL (ref 12.0–15.0)
MCHC: 32.4 g/dL (ref 30.0–36.0)
MCV: 89.1 fl (ref 78.0–100.0)
Platelets: 202 10*3/uL (ref 150.0–400.0)
RBC: 4.27 Mil/uL (ref 3.87–5.11)
RDW: 15.1 % (ref 11.5–15.5)
WBC: 5.3 10*3/uL (ref 4.0–10.5)

## 2014-02-06 LAB — TSH: TSH: 3.53 u[IU]/mL (ref 0.35–4.50)

## 2014-02-06 LAB — T3, FREE: T3 FREE: 2.4 pg/mL (ref 2.3–4.2)

## 2014-02-06 LAB — T4, FREE: FREE T4: 0.79 ng/dL (ref 0.60–1.60)

## 2014-02-06 LAB — HEMOGLOBIN A1C: HEMOGLOBIN A1C: 6.8 % — AB (ref 4.6–6.5)

## 2014-02-09 ENCOUNTER — Other Ambulatory Visit: Payer: Self-pay | Admitting: *Deleted

## 2014-02-09 MED ORDER — WARFARIN SODIUM 2.5 MG PO TABS: ORAL_TABLET | ORAL | Status: DC

## 2014-03-05 ENCOUNTER — Ambulatory Visit: Payer: Medicare Other | Admitting: Pharmacist

## 2014-03-05 ENCOUNTER — Other Ambulatory Visit (HOSPITAL_BASED_OUTPATIENT_CLINIC_OR_DEPARTMENT_OTHER): Payer: Medicare Other

## 2014-03-05 DIAGNOSIS — I824Z9 Acute embolism and thrombosis of unspecified deep veins of unspecified distal lower extremity: Secondary | ICD-10-CM

## 2014-03-05 DIAGNOSIS — I482 Chronic atrial fibrillation, unspecified: Secondary | ICD-10-CM

## 2014-03-05 LAB — POCT INR: INR: 3.3

## 2014-03-05 LAB — PROTIME-INR
INR: 3.3 (ref 2.00–3.50)
Protime: 39.6 Seconds — ABNORMAL HIGH (ref 10.6–13.4)

## 2014-03-05 NOTE — Progress Notes (Signed)
INR = 3.3 on Coumadin 5 mg daily except 7.5 mg on MWF No med changes. No extra doses taken. She has not done anything differently. INR slightly elevated today.  Will have pt take 2.5 mg today only then go back on her maintenance dose of Coumadin 5 mg daily except 7.5 mg MWF. Return in 1 month. NO CHARGE - phone encounter.  Pt did not report to Coumadin clinic after having her lab today. Kennith Center, Pharm.D., CPP 03/05/2014@4 :49 PM

## 2014-03-22 ENCOUNTER — Telehealth: Payer: Self-pay | Admitting: Internal Medicine

## 2014-03-22 NOTE — Telephone Encounter (Signed)
, °

## 2014-03-23 DIAGNOSIS — S0990XA Unspecified injury of head, initial encounter: Secondary | ICD-10-CM

## 2014-03-23 HISTORY — DX: Unspecified injury of head, initial encounter: S09.90XA

## 2014-03-30 ENCOUNTER — Telehealth: Payer: Self-pay | Admitting: Internal Medicine

## 2014-03-30 NOTE — Telephone Encounter (Signed)
returned pt call and s.w pt and r/s lab and cc....pt aware of all appts

## 2014-04-02 ENCOUNTER — Ambulatory Visit: Payer: Medicare Other

## 2014-04-02 ENCOUNTER — Other Ambulatory Visit: Payer: Medicare Other

## 2014-04-05 ENCOUNTER — Ambulatory Visit: Payer: Self-pay

## 2014-04-05 ENCOUNTER — Other Ambulatory Visit: Payer: Self-pay

## 2014-04-05 ENCOUNTER — Telehealth: Payer: Self-pay | Admitting: Internal Medicine

## 2014-04-05 NOTE — Telephone Encounter (Signed)
pt called to cx appt due to son in ER...she will call back to r/s sent msg to cc to inform

## 2014-04-09 ENCOUNTER — Other Ambulatory Visit: Payer: Self-pay

## 2014-04-09 ENCOUNTER — Telehealth: Payer: Self-pay | Admitting: Pharmacist

## 2014-04-09 MED ORDER — PITAVASTATIN CALCIUM 2 MG PO TABS: ORAL_TABLET | ORAL | Status: DC

## 2014-04-09 NOTE — Telephone Encounter (Signed)
Called pt to try to resched CC appt.  Left vm for her to call pharmacy to schedule lab/CC appt. Kennith Center, Pharm.D., CPP 04/09/2014@3 :49 PM

## 2014-04-13 ENCOUNTER — Ambulatory Visit: Payer: Medicare Other

## 2014-04-13 ENCOUNTER — Other Ambulatory Visit: Payer: Medicare Other

## 2014-04-20 ENCOUNTER — Other Ambulatory Visit: Payer: Self-pay | Admitting: *Deleted

## 2014-04-20 MED ORDER — LISINOPRIL 5 MG PO TABS: ORAL_TABLET | ORAL | Status: DC

## 2014-04-23 ENCOUNTER — Ambulatory Visit (INDEPENDENT_AMBULATORY_CARE_PROVIDER_SITE_OTHER): Payer: Medicare Other | Admitting: Family Medicine

## 2014-04-23 ENCOUNTER — Encounter: Payer: Self-pay | Admitting: Family Medicine

## 2014-04-23 VITALS — BP 128/70 | HR 84 | Temp 98.3°F | Wt 238.0 lb

## 2014-04-23 DIAGNOSIS — M159 Polyosteoarthritis, unspecified: Secondary | ICD-10-CM

## 2014-04-23 DIAGNOSIS — I8393 Asymptomatic varicose veins of bilateral lower extremities: Secondary | ICD-10-CM | POA: Diagnosis not present

## 2014-04-23 DIAGNOSIS — M150 Primary generalized (osteo)arthritis: Secondary | ICD-10-CM | POA: Diagnosis not present

## 2014-04-23 DIAGNOSIS — F411 Generalized anxiety disorder: Secondary | ICD-10-CM | POA: Diagnosis not present

## 2014-04-23 DIAGNOSIS — Z23 Encounter for immunization: Secondary | ICD-10-CM | POA: Diagnosis not present

## 2014-04-23 MED ORDER — ALPRAZOLAM 1 MG PO TABS: 1.0000 mg | ORAL_TABLET | Freq: Two times a day (BID) | ORAL | Status: DC

## 2014-04-23 MED ORDER — OXYCODONE HCL 5 MG PO TABS: 5.0000 mg | ORAL_TABLET | Freq: Four times a day (QID) | ORAL | Status: DC | PRN

## 2014-04-23 NOTE — Patient Instructions (Signed)
Varicose Veins Varicose veins are veins that have become enlarged and twisted. CAUSES This condition is the result of valves in the veins not working properly. Valves in the veins help return blood from the leg to the heart. If these valves are damaged, blood flows backwards and backs up into the veins in the leg near the skin. This causes the veins to become larger. People who are on their feet a lot, who are pregnant, or who are overweight are more likely to develop varicose veins. SYMPTOMS   Bulging, twisted-appearing, bluish veins, most commonly found on the legs.  Leg pain or a feeling of heaviness. These symptoms may be worse at the end of the day.  Leg swelling.  Skin color changes. DIAGNOSIS  Varicose veins can usually be diagnosed with an exam of your legs by your caregiver. He or she may recommend an ultrasound of your leg veins. TREATMENT  Most varicose veins can be treated at home.However, other treatments are available for people who have persistent symptoms or who want to treat the cosmetic appearance of the varicose veins. These include:  Laser treatment of very small varicose veins.  Medicine that is shot (injected) into the vein. This medicine hardens the walls of the vein and closes off the vein. This treatment is called sclerotherapy. Afterwards, you may need to wear clothing or bandages that apply pressure.  Surgery. HOME CARE INSTRUCTIONS   Do not stand or sit in one position for long periods of time. Do not sit with your legs crossed. Rest with your legs raised during the day.  Wear elastic stockings or support hose. Do not wear other tight, encircling garments around the legs, pelvis, or waist.  Walk as much as possible to increase blood flow.  Raise the foot of your bed at night with 2-inch blocks.  If you get a cut in the skin over the vein and the vein bleeds, lie down with your leg raised and press on it with a clean cloth until the bleeding stops. Then  place a bandage (dressing) on the cut. See your caregiver if it continues to bleed or needs stitches. SEEK MEDICAL CARE IF:   The skin around your ankle starts to break down.  You have pain, redness, tenderness, or hard swelling developing in your leg over a vein.  You are uncomfortable due to leg pain. Document Released: 12/17/2004 Document Revised: 06/01/2011 Document Reviewed: 05/05/2010 [facility] Patient Information 2015 [place], [place]. This information is not intended to replace advice given to you by your health care provider. Make sure you discuss any questions you have with your health care provider.

## 2014-04-23 NOTE — Progress Notes (Signed)
Subjective:    Patient ID: [name], female    DOB: 05/19/1945, 69 y.o.   MRN: [id number]  HPI Patient here for several things as follows:  She describes a thumping sensation left lower waist. Intermittent for several weeks. No real pain. She denies any fasciculation. She's had similar symptoms like this in the past. She has history of retroperitoneal fibrosis and has seen urologist. She denies any obstructive urinary symptoms. No dysuria. No appetite or weight changes. No fevers or chills. No exacerbating features. No alleviating features.  Patient has history of osteoarthritis. She infrequently takes oxycodone and requesting refill. She's had bilateral knee replacements the past. She is on Coumadin and avoids nonsteroidals  She has varicose veins in her legs and these are starting to become more painful. No pitting edema. She has not tried compression garments. She has battled with obesity for all of her adult life.  Past Medical History  Diagnosis Date  . DYSLIPIDEMIA 02/27/2009  . DEPRESSION 06/13/2008  . ALLERGIC RHINITIS 08/01/2009  . ABDOMINAL PAIN RIGHT UPPER QUADRANT 11/08/2008  . CARDIOVASCULAR FUNCTION STUDY, ABNORMAL 04/04/2009  . NON-HODGKIN'S LYMPHOMA, HX OF dx  2005--  chemoradiation completed 2006--  no recurrence    onocologist- dr odogwu--   . OSTEOARTHRITIS, MODERATE 04/07/2010  . CAD, NATIVE VESSEL 05/30/2009    cardiologist- dr Aundra Dubin- visit 04-13-2010 in epic-- denies cardiac symptoms  . DYSPNEA 02/27/2009  . Retroperitoneal fibrosis   . Frequency of urination   . History of Lyme disease   . History of Bell's palsy   . DDD (degenerative disc disease), lumbar   . GERD (gastroesophageal reflux disease)   . Anxiety   . 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 History of DVT of lower extremity 2006-- CHRONIC COUMADIN THERAPY  . Anticoagulated on Coumadin    Past Surgical History  Procedure Laterality Date  . Multiple cysto/  left ureteral stent exchanges  LAST ONE 10-28-10  . Total knee arthroplasty  OCT 2010    RIGHT  . Total knee arthroplasty  JAN 2010    LEFT  . Knee arthroscopy  2005    RIGHT  . Breast mass excision      BENIGN-- RIGHT BREAST  . Retroperitoneal bx  2007  . Exploratory laparotomy  2005    LYMPHOMA  . Tonsillectomy  CHILD  . Cardiovascular stress test  04-04-2009-- PER PT ASYMPTOMATIC-- MEDICAL MANAGEMENT    STUDY WAS EQUIVOCAL/ EF 49%/ GLOBAL HYPOKINESIS/ APPEARS TO BE A MILD ANTERIOR PERFUSION DEFECT COULD REPRESENT ISCHEMIA BUT DUE TO  ACTIVITY WORSE IN STRESS THAN AT REST POSS. THE DEFECT WAS ARTIFACTUAL  . Transthoracic echocardiogram  [id number]    MILD SYSTOLIC DYSFUNCTION/ EF 25-95%/ MODERATE DIASTOLIC DYSFUCTION/ MILD MR/ MILD BIATRIAL ENLARGEMENT  . Ct angiogram  FEB 2011    CALCIUM SCORE 161/ POSS. MODERATE MID LAD STENOSIS  . Cystoscopy w/ ureteral stent removal  04/28/2011    Procedure: CYSTOSCOPY WITH STENT REMOVAL;  Surgeon: Fredricka Bonine, MD;  Location: [facility];  Service: Urology;;  . Cystoscopy w/ retrogrades  04/28/2011    Procedure: CYSTOSCOPY WITH RETROGRADE PYELOGRAM;  Surgeon: Fredricka Bonine, MD;  Location: [facility];  Service: Urology;  Laterality: Left;    reports that she quit smoking about 6 years ago. Her smoking use included Cigarettes. She has a 104 pack-year smoking history. She has never used smokeless tobacco. She reports that she drinks alcohol. She reports that she does not use  illicit drugs. family history includes Arthritis in her mother; Diabetes in her sister; Heart disease in her mother; Hypertension in her sister; Stroke in her sister. Allergies  Allergen Reactions  . Chlorhexidine Base Itching    CHG WIPES  . Penicillins Hives and Rash      Review of Systems  Constitutional: Negative for fever, chills, appetite change and unexpected weight change.  Respiratory: Negative for cough and  shortness of breath.   Cardiovascular: Negative for chest pain, palpitations and leg swelling.  Gastrointestinal: Negative for abdominal pain.  Genitourinary: Negative for dysuria and hematuria.  Hematological: Negative for adenopathy. Does not bruise/bleed easily.       Objective:   Physical Exam  Constitutional: She appears well-developed and well-nourished. No distress.  Neck: Neck supple. No JVD present.  Cardiovascular: Normal rate and regular rhythm.   Pulmonary/Chest: Effort normal and breath sounds normal. No respiratory distress. She has no wheezes. She has no rales.  Musculoskeletal: She exhibits no edema.  She has scattered varicose veins in both legs. No pitting edema          Assessment & Plan:  #1 painful varicose veins. We've written prescription for compression garments knee-high 15-20 mmHg pressure. Frequent elevation. Consider referral to vein specialist if pain not improving with the above #2 chronic osteoarthritis. Infrequent use of oxycodone. Refill given #3 chronic anxiety. She's been for many years on alprazolam and refills given #4 health maintenance. Flu vaccine given per patient request #5 she complains of vague "thumping" sensation left lower waist/lower lumbar region. This is not really painful. She had similar symptoms in the past when she was diagnosed with retroperitoneal fibrosis. She'll follow up with urologist for consideration of imaging such as ultrasound

## 2014-04-23 NOTE — Progress Notes (Signed)
Pre visit review using our clinic review tool, if applicable. No additional management support is needed unless otherwise documented below in the visit note. 

## 2014-04-24 ENCOUNTER — Encounter: Payer: Self-pay | Admitting: Internal Medicine

## 2014-04-24 ENCOUNTER — Ambulatory Visit (HOSPITAL_BASED_OUTPATIENT_CLINIC_OR_DEPARTMENT_OTHER): Payer: Medicare Other | Admitting: Internal Medicine

## 2014-04-24 ENCOUNTER — Other Ambulatory Visit (HOSPITAL_BASED_OUTPATIENT_CLINIC_OR_DEPARTMENT_OTHER): Payer: Medicare Other

## 2014-04-24 ENCOUNTER — Telehealth: Payer: Self-pay | Admitting: Internal Medicine

## 2014-04-24 ENCOUNTER — Ambulatory Visit: Payer: Medicare Other | Admitting: Pharmacist

## 2014-04-24 VITALS — BP 137/62 | HR 75 | Temp 98.1°F | Resp 18 | Ht 66.5 in | Wt 244.8 lb

## 2014-04-24 DIAGNOSIS — Z86718 Personal history of other venous thrombosis and embolism: Secondary | ICD-10-CM

## 2014-04-24 DIAGNOSIS — C8590 Non-Hodgkin lymphoma, unspecified, unspecified site: Secondary | ICD-10-CM

## 2014-04-24 DIAGNOSIS — Z8572 Personal history of non-Hodgkin lymphomas: Secondary | ICD-10-CM

## 2014-04-24 DIAGNOSIS — I82409 Acute embolism and thrombosis of unspecified deep veins of unspecified lower extremity: Secondary | ICD-10-CM

## 2014-04-24 DIAGNOSIS — I482 Chronic atrial fibrillation, unspecified: Secondary | ICD-10-CM

## 2014-04-24 LAB — POCT INR: INR: 2.7

## 2014-04-24 LAB — PROTIME-INR
INR: 2.7 (ref 2.00–3.50)
Protime: 32.4 Seconds — ABNORMAL HIGH (ref 10.6–13.4)

## 2014-04-24 NOTE — Progress Notes (Signed)
INR = 2.7    Goal 2-3 INR within goal range and stable. She has been taking Armour Thyroid for 3 weeks now. She was given a prescription for pregnenolone yesterday for thinning hair. She has had some slight bruising, but this is typical for her. She will continue taking Coumadin 5mg  daily except for 7.5mg  on MWF.  We will recheck INR in 6 weeks on 06/05/14; lab at 2pm and Coumadin clinic at 2:15pm.  Theone Murdoch, PharmD

## 2014-04-24 NOTE — Progress Notes (Signed)
[facility] [phone]   [phone]  OFFICE PROGRESS NOTE  Angela Post, MD [place] [address]  DIAGNOSIS:  1) History of stage II large B-cell non-Hodgkin lymphoma presenting with retroperitoneal lymphadenopathy complicated by retroperitoneal fibrosis and left-sided hydronephrosis diagnosed in December of 2005. 2) history of deep venous thrombosis of the left lower extremity, felt to be secondary to the retroperitoneal fibrosis first diagnosed in December 2005.  PRIOR THERAPY:  1) Status Bray 6 cycles of systemic chemotherapy with CHOP/Rituxan last dose was given 03/06/2005 under the care of Dr. Jamse Arn. 2) status Bray consolidation radiotherapy to the retroperitoneum and pelvis in 2006.  CURRENT THERAPY:  1) Observation 2) , then managed by the Coumadin clinic at the [facility] for the history of recurrent deep venous thrombosis  INTERVAL HISTORY: [name] 69 y.o. female returns to the clinic today for follow-up visit. She is a former patient of Dr. Jamse Arn as well as Dr. Juliann Mule is here today to establish care with me after the previous physicians left the practice. She is very pleasant 69 years old white female was diagnosed with a stage II large B-cell non-Hodgkin lymphoma in December 2005 status Bray systemic chemotherapy with CHOP/Rituxan followed by consolidation radiotherapy. She has been observation since that time was no evidence for disease recurrence. Her last imaging studies was a PET scan on 03/28/2013 that showed no evidence for recurrent lymphoma. The patient has been on chronic treatment with Coumadin because of history of left lower extremity deep venous thrombosis diagnosed in 2005 that was felt at that time to be secondary to the retroperitoneal fibrosis. She also has a recent history of arrhythmia followed by Dr. Aundra Dubin. The patient is feeling fine today with no specific complaints. She denied  having any significant weight loss or night sweats. She denied having any chest pain, shortness of breath, cough or hemoptysis. She has no nausea or vomiting. She had repeat CBC and comprehensive metabolic panel performed recently and she is here for evaluation and discussion of her lab results.  MEDICAL HISTORY: Past Medical History  Diagnosis Date  . DYSLIPIDEMIA 02/27/2009  . DEPRESSION 06/13/2008  . ALLERGIC RHINITIS 08/01/2009  . ABDOMINAL PAIN RIGHT UPPER QUADRANT 11/08/2008  . CARDIOVASCULAR FUNCTION STUDY, ABNORMAL 04/04/2009  . NON-HODGKIN'S LYMPHOMA, HX OF dx  2005--  chemoradiation completed 2006--  no recurrence    onocologist- dr odogwu--   . OSTEOARTHRITIS, MODERATE 04/07/2010  . CAD, NATIVE VESSEL 05/30/2009    cardiologist- dr Aundra Dubin- visit 04-13-2010 in epic-- denies cardiac symptoms  . DYSPNEA 02/27/2009  . Retroperitoneal fibrosis   . Frequency of urination   . History of Lyme disease   . History of Bell's palsy   . DDD (degenerative disc disease), lumbar   . GERD (gastroesophageal reflux disease)   . Anxiety   . 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 History of DVT of lower extremity 2006-- CHRONIC COUMADIN THERAPY  . Anticoagulated on Coumadin     ALLERGIES:  is allergic to chlorhexidine base and penicillins.  MEDICATIONS:  Current Outpatient Prescriptions  Medication Sig Dispense Refill  . albuterol (PROVENTIL HFA;VENTOLIN HFA) 108 (90 BASE) MCG/ACT inhaler Inhale 2 puffs into the lungs every 4 (four) hours as needed for wheezing or shortness of breath. 1 Inhaler 0  . ALPRAZolam (XANAX) 1 MG tablet Take 1 tablet (1 mg total) by mouth 2 (two) times daily. 60 tablet 5  . b complex vitamins  capsule Take by mouth.    . beta carotene w/minerals (OCUVITE) tablet Take 1 tablet by mouth daily.    . Biotin 5000 MCG CAPS Take 5 mg by mouth.    Marland Kitchen buPROPion (WELLBUTRIN XL) 300 MG 24 hr tablet take 1 tablet by mouth once daily 90 tablet 1  .  chlorhexidine (PERIDEX) 0.12 % solution 5 mLs by Mouth Rinse route Twice daily.    . Cholecalciferol (VITAMIN D) 2000 UNITS CAPS Take 2,000 Int'l Units by mouth.    . citalopram (CELEXA) 40 MG tablet take 1 tablet by mouth once daily 90 tablet 3  . co-enzyme Q-10 30 MG capsule Take 30 mg by mouth daily.     Marland Kitchen diltiazem (CARDIZEM CD) 180 MG 24 hr capsule Take 180 mg by mouth daily.    . fluticasone (FLONASE) 50 MCG/ACT nasal spray instill 2 sprays into each nostril once daily 16 g 6  . latanoprost (XALATAN) 0.005 % ophthalmic solution     . lisinopril (PRINIVIL,ZESTRIL) 5 MG tablet take 1 tablet by mouth once daily 90 tablet 0  . loratadine (CLARITIN) 10 MG tablet Take 10 mg by mouth daily.     Marland Kitchen Lysine 500 MG TABS Take 500 mg by mouth daily.    Marland Kitchen omeprazole (PRILOSEC) 20 MG capsule Take 20 mg by mouth every morning.    Marland Kitchen oxyCODONE (OXY IR/ROXICODONE) 5 MG immediate release tablet Take 1 tablet (5 mg total) by mouth every 6 (six) hours as needed for severe pain. 30 tablet 0  . Pitavastatin Calcium (LIVALO) 2 MG TABS take 1 tablet by mouth once daily 90 tablet 2  . Pregnenolone Micronized POWD by Does not apply route.    . Thyroid 48.75 MG TABS Take by mouth.    . warfarin (COUMADIN) 2.5 MG tablet take as directed ON MONDAY, WEDNESDAY, AND Friday or as directed 45 tablet 0  . warfarin (COUMADIN) 5 MG tablet Take 5 mg daily except 7.5 mg on MWF or as directed 90 tablet 3  . NITROSTAT 0.4 MG SL tablet place 1 tablet under the tongue every 5 minutes for UP TO 3 doses if needed (Patient not taking: Reported on 04/24/2014) 25 tablet 12   No current facility-administered medications for this visit.    SURGICAL HISTORY:  Past Surgical History  Procedure Laterality Date  . Multiple cysto/ left ureteral stent exchanges  LAST ONE 10-28-10  . Total knee arthroplasty  OCT 2010    RIGHT  . Total knee arthroplasty  JAN 2010    LEFT  . Knee arthroscopy  2005    RIGHT  . Breast mass excision       BENIGN-- RIGHT BREAST  . Retroperitoneal bx  2007  . Exploratory laparotomy  2005    LYMPHOMA  . Tonsillectomy  CHILD  . Cardiovascular stress test  04-04-2009-- PER PT ASYMPTOMATIC-- MEDICAL MANAGEMENT    STUDY WAS EQUIVOCAL/ EF 49%/ GLOBAL HYPOKINESIS/ APPEARS TO BE A MILD ANTERIOR PERFUSION DEFECT COULD REPRESENT ISCHEMIA BUT DUE TO  ACTIVITY WORSE IN STRESS THAN AT REST POSS. THE DEFECT WAS ARTIFACTUAL  . Transthoracic echocardiogram  [id number]    MILD SYSTOLIC DYSFUNCTION/ EF 65-68%/ MODERATE DIASTOLIC DYSFUCTION/ MILD MR/ MILD BIATRIAL ENLARGEMENT  . Ct angiogram  FEB 2011    CALCIUM SCORE 161/ POSS. MODERATE MID LAD STENOSIS  . Cystoscopy w/ ureteral stent removal  04/28/2011    Procedure: CYSTOSCOPY WITH STENT REMOVAL;  Surgeon: Fredricka Bonine, MD;  Location: [facility];  Service: Urology;;  . Cystoscopy w/ retrogrades  04/28/2011    Procedure: CYSTOSCOPY WITH RETROGRADE PYELOGRAM;  Surgeon: Fredricka Bonine, MD;  Location: [facility];  Service: Urology;  Laterality: Left;    REVIEW OF SYSTEMS:  Constitutional: negative Eyes: negative Ears, nose, mouth, throat, and face: negative Respiratory: negative Cardiovascular: negative Gastrointestinal: negative Genitourinary:negative Integument/breast: negative Hematologic/lymphatic: negative Musculoskeletal:negative Neurological: negative Behavioral/Psych: negative Endocrine: negative Allergic/Immunologic: negative   PHYSICAL EXAMINATION: General appearance: alert, cooperative and no distress Head: Normocephalic, without obvious abnormality, atraumatic Neck: no adenopathy, no JVD, supple, symmetrical, trachea midline and thyroid not enlarged, symmetric, no tenderness/mass/nodules Lymph nodes: Cervical, supraclavicular, and axillary nodes normal. Resp: clear to auscultation bilaterally Back: symmetric, no curvature. ROM normal. No CVA tenderness. Cardio: regular rate and rhythm, S1, S2  normal, no murmur, click, rub or gallop GI: soft, non-tender; bowel sounds normal; no masses,  no organomegaly Extremities: extremities normal, atraumatic, no cyanosis or edema Neurologic: Alert and oriented X 3, normal strength and tone. Normal symmetric reflexes. Normal coordination and gait  ECOG PERFORMANCE STATUS: 1 - Symptomatic but completely ambulatory  Blood pressure 137/62, pulse 75, temperature 98.1 F (36.7 C), temperature source Oral, resp. rate 18, height 5' 6.5" (1.689 m), weight 244 lb 12.8 oz (111.041 kg), SpO2 98 %.  LABORATORY DATA: Lab Results  Component Value Date   WBC 5.3 02/06/2014   HGB 12.3 02/06/2014   HCT 38.0 02/06/2014   MCV 89.1 02/06/2014   PLT 202.0 02/06/2014      Chemistry      Component Value Date/Time   NA 140 02/06/2014 1054   NA 139 05/10/2012 1424   K 5.2* 02/06/2014 1054   K 4.2 05/10/2012 1424   CL 108 02/06/2014 1054   CL 105 05/10/2012 1424   CO2 22 02/06/2014 1054   CO2 25 05/10/2012 1424   BUN 18 02/06/2014 1054   BUN 17.2 05/10/2012 1424   CREATININE 1.2 02/06/2014 1054   CREATININE 0.9 05/10/2012 1424      Component Value Date/Time   CALCIUM 9.4 02/06/2014 1054   CALCIUM 9.7 05/10/2012 1424   ALKPHOS 62 02/06/2014 1054   ALKPHOS 71 05/10/2012 1424   AST 23 02/06/2014 1054   AST 17 05/10/2012 1424   ALT 17 02/06/2014 1054   ALT 22 05/10/2012 1424   BILITOT 0.5 02/06/2014 1054   BILITOT 0.38 05/10/2012 1424       RADIOGRAPHIC STUDIES: No results found.  ASSESSMENT AND PLAN: This is a very pleasant 69 years old white female with history of stage II large B-cell non-Hodgkin lymphoma diagnosed in December 2005 status Bray systemic chemotherapy with CHOP/Rituxan followed by consolidation radiotherapy to the retroperitoneal lymphadenopathy. She has been observation since that time with no evidence for disease recurrence. I had a lengthy discussion with the patient today about her current condition. I recommended for her  to continue on observation with annual follow-up visit with repeat blood work. I don't see a need to repeat any imaging studies at this point unless the patient has concerning symptoms. For the left lower extremity deep venous thrombosis, this was diagnosed more than 10 years ago and the patient has no evidence for hypercoagulable abnormality based on the record I seen till now. I don't see a need for the patient to continue on chronic anticoagulation unless this is needed for her cardiac arrhythmia. I advised the patient to discuss with Dr. Aundra Dubin and this is no need for her to be on anticoagulation for her cardiac  condition, I would discontinue her treatment with Coumadin. The patient agreed to the current plan. She was advised to call immediately if she has any concerning symptoms in the interval. The patient voices understanding of current disease status and treatment options and is in agreement with the current care plan.  All questions were answered. The patient knows to call the clinic with any problems, questions or concerns. We can certainly see the patient much sooner if necessary.  I spent 20 minutes counseling the patient face to face. The total time spent in the appointment was 30 minutes.  Disclaimer: This note was dictated with voice recognition software. Similar sounding words can inadvertently be transcribed and may not be corrected upon review.

## 2014-05-09 ENCOUNTER — Other Ambulatory Visit: Payer: Self-pay | Admitting: Family Medicine

## 2014-05-11 ENCOUNTER — Other Ambulatory Visit: Payer: Self-pay | Admitting: *Deleted

## 2014-05-11 MED ORDER — WARFARIN SODIUM 2.5 MG PO TABS: ORAL_TABLET | ORAL | Status: DC

## 2014-05-11 NOTE — Telephone Encounter (Signed)
Notified patient that refill for 2.5mg  coumadin tablets has been sent to her pharmacy. Reviewed instructions with patient and she states she has been taking one 2.5mg  tablet on M,W,F along with a 5mg  tablet as instructed by Theone Murdoch.

## 2014-05-17 ENCOUNTER — Ambulatory Visit (INDEPENDENT_AMBULATORY_CARE_PROVIDER_SITE_OTHER): Payer: Medicare Other | Admitting: Cardiology

## 2014-05-17 ENCOUNTER — Encounter: Payer: Self-pay | Admitting: Cardiology

## 2014-05-17 ENCOUNTER — Encounter: Payer: Self-pay | Admitting: *Deleted

## 2014-05-17 VITALS — BP 110/64 | HR 115 | Ht 66.5 in | Wt 242.0 lb

## 2014-05-17 DIAGNOSIS — I2510 Atherosclerotic heart disease of native coronary artery without angina pectoris: Secondary | ICD-10-CM

## 2014-05-17 DIAGNOSIS — I059 Rheumatic mitral valve disease, unspecified: Secondary | ICD-10-CM

## 2014-05-17 DIAGNOSIS — I480 Paroxysmal atrial fibrillation: Secondary | ICD-10-CM

## 2014-05-17 MED ORDER — DILTIAZEM HCL ER COATED BEADS 180 MG PO CP24: 180.0000 mg | ORAL_CAPSULE | Freq: Two times a day (BID) | ORAL | Status: DC

## 2014-05-17 MED ORDER — DRONEDARONE HCL 400 MG PO TABS: 400.0000 mg | ORAL_TABLET | Freq: Two times a day (BID) | ORAL | Status: DC

## 2014-05-17 NOTE — Patient Instructions (Signed)
Decrease Celexa to 20mg  daily for 4 days then stop taking.  After you have been off Celexa for 3 days start  Multaq 400mg  two times a day with meals. It is important to take this with food.  Increase diltiazem to 180mg  two times a day.  Your physician recommends that you schedule a follow-up appointment in: 3 weeks with Dr Aundra Dubin.

## 2014-05-18 NOTE — Progress Notes (Signed)
Patient ID: [name], female   DOB: [birth year], 69 y.o.   MRN: [id number] PCP: Dr. Elease Hashimoto  69 yo with history of retroperitoneal fibrosis and non-Hodgkin's lymphoma as well as prior DVT presented initially for evaluation of an abnormal myoview.  A Lexiscan myoview was done in 1/11. This was an equivocal study due to subdiaphragmatic nuclear uptake. EF was calculated to be 49% with global hypokinesis. Given the equivocal myoview, I had her do a coronary CT angiogram. This showed a moderate calcium score of 161 (putting her at a high risk percentile for her age) and a possible moderate mid LAD stenosis (though some degradation of images by respiratory artifact). Echo was done to reassess LV systolic function (reported as mildly decreased on myoview). This confirmed mild LV systolic dysfunction, EF 73-22% with moderate diastolic dysfunction. We discussed catheterization, but patient strongly preferred medical treatment.   She has been diagnosed with paroxysmal atrial fibrillation.  She was started on warfarin and diltiazem CD.  Last echo in 8/15 showed EF 50-55%, moderate LV hypertrophy, probably mild mitral stenosis, normal RV size and systolic function.    Today, she is in atrial fibrillation with HR in 110s.  She was helping her son move out of his house earlier today.  She did some straining to move heavy furniture and wonders if this could have triggered atrial fibrillation.  She does note that her heart is beating faster than usual.  Prior to today, she had rare brief palpitations.  Generally, she has some exertional dyspnea walking up steps or up hills.  No dyspnea walking on flat ground.  No chest pain.  No orthopnea or PND.  Weight is down 2 lbs compared to prior appt.   Labs (12/10): HCT 34.5, K 4.3, creatinine 1.2, LDL 101, HDL 46  Labs (7/11): LFTs normal, LDL 69, HDL 47, BNP 21, K 4.3, creatinine 1.2  Labs (1/12): K 5.2, creatinine 1.0, LFTs normal, LDL 59, HDL 46  Labs (6/13): LDL  54, HDL 52, K 4.7, creatinine 1.1 Labs (9/14): LDL 79, HDL 56 Labs (4/15): TSH normal Labs (11/15): K 5.2, creatinine 1.2, LDL 69, HDL 63  ECG: Atrial fibrillation, HR 115  Past Medical History:   1. Anxiety Disorder  2. Arthritis  3. Depression  4. Retroperitoneal fibrosis with non-Hodgkin's lymphoma. This was treated with chemotherapy in 2006 and is in remission.  5. Adenomatous Colon Polyps  6. Diverticulosis  7. DVT left leg/thigh: in both 2005 and 2006, associated with lymphoma. She is on chronic coumadin.  8. Obesity  9. Left ureteral stent (ureter involved by retroperitoneal fibrosis).  10. CAD: Lexiscan myoview (1/11): EF 49% with global hypokinesis. There appeared to be a mild anterior perfusion defect that could represent ischemia. However, there was significant subdiaphragmatic nuclear activity that was worse in stress than rest so it is possible that the defect was artifactual. This study was equivocal. Coronary CT angiogram was done to follow this up in 2/11. This showed calcium score of 161 (86th percentile for her age and race) and a possible moderate mid LAD stenosis.  11. Mild systolic dysfunction: EF 02% on myoview. Echo (2/11): EF 45-50%, moderate diastolic dysfunction, mild MR, mild biatrial enlargement, PASP 34 mmHg. It is possible that this could be chemotherapy-related.  Echo (11/14): EF 50-55%, grade II diastolic dysfunction, ?moderate mitral stenosis, no MR. Echo (8/15): EF 50-55%, moderate LVH, probably mild mitral stenosis with mean gradient 2, normal PA pressure, normal RV.  12. Hyperlipidemia: myalgias with Crestor.  13.  Atrial fibrillation: Paroxysmal.   Family History:  Family History of Arthritis mother  Family History Diabetes 1st degree relative deceased sister  Family History Hypertension deceased sister  Family History of Stroke F 1st degree relative <60 deceased sister  Family History of Cardiovascular disorder mother  Sister had MI and CVA at 66   Mother with CABG in her 40s   Social History:  Retired  Married  Quit smoking 5/10  Alcohol use-yes, rarely  Regular exercise-no   Review of Systems  All systems reviewed and negative except as per HPI.   Current Outpatient Prescriptions  Medication Sig Dispense Refill  . ALPRAZolam (XANAX) 1 MG tablet Take 1 tablet (1 mg total) by mouth 2 (two) times daily. 60 tablet 5  . b complex vitamins capsule Take by mouth.    . beta carotene w/minerals (OCUVITE) tablet Take 1 tablet by mouth daily.    Marland Kitchen buPROPion (WELLBUTRIN XL) 300 MG 24 hr tablet take 1 tablet by mouth once daily 90 tablet 1  . chlorhexidine (PERIDEX) 0.12 % solution 5 mLs by Mouth Rinse route Twice daily.    . Cholecalciferol (VITAMIN D) 2000 UNITS CAPS Take 2,000 Int'l Units by mouth.    . co-enzyme Q-10 30 MG capsule Take 30 mg by mouth daily.     Marland Kitchen diltiazem (CARDIZEM CD) 180 MG 24 hr capsule Take 1 capsule (180 mg total) by mouth 2 (two) times daily. 60 capsule 6  . fluticasone (FLONASE) 50 MCG/ACT nasal spray instill 2 sprays into each nostril once daily 16 g 6  . lisinopril (PRINIVIL,ZESTRIL) 5 MG tablet take 1 tablet by mouth once daily 90 tablet 0  . loratadine (CLARITIN) 10 MG tablet Take 10 mg by mouth daily.     Marland Kitchen Lysine 500 MG TABS Take 500 mg by mouth daily.    Marland Kitchen NITROSTAT 0.4 MG SL tablet place 1 tablet under the tongue every 5 minutes for UP TO 3 doses if needed 25 tablet 12  . omeprazole (PRILOSEC) 20 MG capsule Take 20 mg by mouth every morning.    Marland Kitchen oxyCODONE (OXY IR/ROXICODONE) 5 MG immediate release tablet Take 1 tablet (5 mg total) by mouth every 6 (six) hours as needed for severe pain. 30 tablet 0  . Pitavastatin Calcium (LIVALO) 2 MG TABS take 1 tablet by mouth once daily 90 tablet 2  . Pregnenolone Micronized POWD by Does not apply route.    . Thyroid 48.75 MG TABS Take by mouth.    . warfarin (COUMADIN) 2.5 MG tablet Take as directed ON MONDAY, WEDNESDAY, AND FRIDAY or as directed 45 tablet 0  .  warfarin (COUMADIN) 5 MG tablet Take 5 mg daily except 7.5 mg on MWF or as directed 90 tablet 3  . dronedarone (MULTAQ) 400 MG tablet Take 1 tablet (400 mg total) by mouth 2 (two) times daily with a meal. 60 tablet 1   No current facility-administered medications for this visit.   BP 110/64 mmHg  Pulse 115  Ht 5' 6.5" (1.689 m)  Wt 242 lb (109.77 kg)  BMI 38.48 kg/m2 General: NAD, obese.  Neck: No JVD, no thyromegaly or thyroid nodule.  Lungs: Clear to auscultation bilaterally with normal respiratory effort. CV: Nondisplaced PMI.  Heart tachy, irregular S1/S2, no S3/S4, no murmur.  Bilateral varicosities with trace ankle edema.  No carotid bruit.  Normal pedal pulses.  Abdomen: Soft, nontender, no hepatosplenomegaly, no distention.  Neurologic: Alert and oriented x 3.  Psych: Normal affect. Extremities: No  clubbing or cyanosis.   Assessment/Plan: 1. Atrial fibrillation: Paroxysmal.  She is in atrial fibrillation today.  She may go in and out of atrial fibrillation at home based on her periodic palpitations.  - Continue warfarin (has preferred this to NOAC).  - Increase diltiazem CD to 180 mg bid with tachycardia. - I will start her on Multaq 400 mg bid (take with food) to see if we can keep her in NSR.   - Followup in office in 2 wks to see if she is out of atrial fibrillation.  If not, will need to consider cardioversion.  2. CAD: She is on warfarin so not on ASA.  No chest pain.  Stable exertional dyspnea.  Continue statin.  3. Hyperlipidemia: Good lipids in 11/15.  Goal LDL < 70 given known CAD from coronary CTA in past.  4. Mitral stenosis: Reported on echo from outside lab.  We reassessed this by echo here in 8/15, suspect no more than mild mitral stenosis.   Loralie Champagne 05/18/2014

## 2014-05-25 ENCOUNTER — Telehealth: Payer: Self-pay | Admitting: Cardiology

## 2014-05-25 NOTE — Telephone Encounter (Signed)
Pt calling to confirm dosage of cardizem CD per Dr Aundra Dubin.  Informed the pt that per Dr Claris Gladden assessment and plan from [place] on 05/17/14, the pt is to take Cardizem CD 180 mg po bid.  Pt verbalized understanding and agrees with this plan.

## 2014-05-25 NOTE — Telephone Encounter (Signed)
New Message    Patient needs to know what the correct dosage is for her medication. Please give a call back

## 2014-05-28 ENCOUNTER — Telehealth: Payer: Self-pay | Admitting: Family Medicine

## 2014-05-28 NOTE — Telephone Encounter (Signed)
Keep appt for then

## 2014-05-28 NOTE — Telephone Encounter (Signed)
Patient Name: [name]  DOB: 13-Dec-1945    Initial Comment Caller states she is on Celexa a new medication for her. Now, her cardiologist has her stopping the Celexa and she is feeling not right. Caller states she is on a Multaq 400mg .    Nurse Assessment  Nurse: Melina Modena, RN, Estill Bamberg Date/Time (Eastern Time): 05/28/2014 3:25:45 PM  Confirm and document reason for call. If symptomatic, describe symptoms. ---Caller states she is on Celexa (has been on medication for several years). Recently dx with a-fib. Now, her cardiologist has her stopping the Celexa and she is feeling not right. Caller states she is on a Multaq 400mg  BID and was told it interacts with Celexa. Pt now feels anxious, but no other sx  Has the patient traveled out of the country within the last 30 days? ---Not Applicable  Does the patient require triage? ---Yes  Related visit to physician within the last 2 weeks? ---No  Does the PT have any chronic conditions? (i.e. diabetes, asthma, etc.) ---Yes  List chronic conditions. ---a-fib, kidney disease     Guidelines    Guideline Title Affirmed Question Affirmed Notes  Anxiety and Panic Attack [1] Symptoms of anxiety or panic AND [2] has not been evaluated for this by physician    Final Disposition User   See PCP When Office is Open (within 3 days) [place], Therapist, sports, Estill Bamberg    Comments  Scheduled pt an appt for 06/01/14 at 2:30pm with Dr. Elease Hashimoto

## 2014-05-28 NOTE — Telephone Encounter (Signed)
Pt informed

## 2014-05-29 ENCOUNTER — Ambulatory Visit (INDEPENDENT_AMBULATORY_CARE_PROVIDER_SITE_OTHER): Payer: Medicare Other | Admitting: Physician Assistant

## 2014-05-29 ENCOUNTER — Telehealth: Payer: Self-pay | Admitting: Cardiology

## 2014-05-29 ENCOUNTER — Encounter: Payer: Self-pay | Admitting: Physician Assistant

## 2014-05-29 VITALS — BP 132/69 | HR 94 | Ht 66.5 in | Wt 244.0 lb

## 2014-05-29 DIAGNOSIS — I480 Paroxysmal atrial fibrillation: Secondary | ICD-10-CM | POA: Diagnosis not present

## 2014-05-29 DIAGNOSIS — R002 Palpitations: Secondary | ICD-10-CM | POA: Diagnosis not present

## 2014-05-29 DIAGNOSIS — E785 Hyperlipidemia, unspecified: Secondary | ICD-10-CM | POA: Diagnosis not present

## 2014-05-29 DIAGNOSIS — I2510 Atherosclerotic heart disease of native coronary artery without angina pectoris: Secondary | ICD-10-CM | POA: Diagnosis not present

## 2014-05-29 DIAGNOSIS — R0602 Shortness of breath: Secondary | ICD-10-CM

## 2014-05-29 DIAGNOSIS — I059 Rheumatic mitral valve disease, unspecified: Secondary | ICD-10-CM

## 2014-05-29 LAB — BASIC METABOLIC PANEL
BUN: 13 mg/dL (ref 6–23)
CALCIUM: 9.4 mg/dL (ref 8.4–10.5)
CO2: 30 mEq/L (ref 19–32)
CREATININE: 1.08 mg/dL (ref 0.40–1.20)
Chloride: 108 mEq/L (ref 96–112)
GFR: 53.28 mL/min — ABNORMAL LOW (ref >60.00)
GLUCOSE: 131 mg/dL — AB (ref 70–99)
Potassium: 4 mEq/L (ref 3.5–5.1)
SODIUM: 141 meq/L (ref 135–145)

## 2014-05-29 LAB — POCT INR: INR: 5.1

## 2014-05-29 LAB — PROTIME-INR
INR: 5.1 ratio — ABNORMAL HIGH (ref 0.8–1.0)
Prothrombin Time: 54.2 s (ref 9.6–13.1)

## 2014-05-29 LAB — BRAIN NATRIURETIC PEPTIDE: Pro B Natriuretic peptide (BNP): 168 pg/mL — ABNORMAL HIGH (ref 0.0–100.0)

## 2014-05-29 MED ORDER — FUROSEMIDE 20 MG PO TABS: ORAL_TABLET | ORAL | Status: DC

## 2014-05-29 NOTE — Patient Instructions (Addendum)
Your physician has recommended you make the following change in your medication:  1) TAKE Lasix 20mg  daily for 3 days. Then take as needed for weight gain on 2lbs lbs in 1 day or 5lbs in 1 week  Lab Today:Bmet, Bnp, Pt/Inr  Your physician recommends that you return for lab work in: 1 week (Bmet). You have been given a written order to take with your oncology appt on 06/05/14  Your physician recommends that you weigh, daily, at the same time every day, and in the same amount of clothing. Please record your daily weights.  Call if symptoms have not improved by Thursday  Keep your appointment with Dr.Mclean on 3/18  The procedure that we discussed is called a Cardioversion.

## 2014-05-29 NOTE — Telephone Encounter (Signed)
Pt c/o Shortness Of Breath: STAT if SOB developed within the last 24 hours or pt is noticeably SOB on the phone  1. Are you currently SOB (can you hear that pt is SOB on the phone)? No  2. How long have you been experiencing SOB? 2 days  3. Are you SOB when sitting or when up moving around? Moving around and laying down  4. Are you currently experiencing any other symptoms? Having a hard time taking deep breathes, "can feel her heart more"

## 2014-05-29 NOTE — Telephone Encounter (Signed)
Patient called complaining of having heart flipping and SOB with activity, possibly in A. Fib. Patient is also on Cardizem CD 180 mg twice daily.  Patient was recently taken off citalopram (Celexa) to take Multaq which was started 4 days ago. Patient has been having symptoms of constantly feeling heart beating for the past two days. Patient states that her heart settles down a little when she takes her Cardizem. Patient is having problems sleeping at night and needs to have a lot of pillows to sleep. Patient has some trouble taking deep breaths. Patient had an EKG 2 weeks ago that showed A. Fib. Will consult Richardson Dopp PA ([facility]). He advise patient come in today. Patient has appointment at 1:30 pm. Called patient back, she feel some improvement after taking her Cardizem this am.

## 2014-05-29 NOTE — Progress Notes (Addendum)
Cardiology Office Note   Date:  05/29/2014   ID:  [name], DOB 1945/12/19, MRN [id number]  PCP:  [name], MD  Cardiologist:  Dr. Loralie Champagne     Chief Complaint  Patient presents with  . Palpitations     History of Present Illness: [name] is a 69 y.o. female with a hx of retroperitoneal fibrosis and non-Hodgkin's lymphoma as well as prior DVT presented initially for evaluation of an abnormal myoview. A Lexiscan myoview was done in 1/11. This was an equivocal study due to subdiaphragmatic nuclear uptake. EF was calculated to be 49% with global hypokinesis. Given the equivocal myoview, she underwent coronary CT angiogram. This showed a moderate calcium score of 161 (putting her at a high risk percentile for her age) and a possible moderate mid LAD stenosis (though some degradation of images by respiratory artifact). Echo was done to reassess LV systolic function (reported as mildly decreased on myoview). This confirmed mild LV systolic dysfunction, EF 86-57% with moderate diastolic dysfunction. Dr. Aundra Dubin discussed catheterization, but patient strongly preferred medical treatment.   She has been diagnosed with paroxysmal atrial fibrillation. She was started on warfarin and diltiazem CD. Last echo in 8/15 showed EF 50-55%, moderate LV hypertrophy, probably mild mitral stenosis, normal RV size and systolic function.   Last seen by Dr. Loralie Champagne 05/17/14.  She was back in AFib with HR 113.  Diltiazem was increased and she was placed on Multaq.  Celexa was stopped.  She was to FU in 2 weeks with an eye towards DCCV if she remained in AFib.  She called in today with palpitations and was added on to my schedule.  She has noted increased DOE over the past 1-2 weeks. She also can feel her heart beat, especially at night.  She denies rapid palpitations.  She is concerned that she is more short of breath since starting the Multaq.  She denies PND.  But, she does  note 2-3 pillow orthopnea over the past few days.  She denies chest pain, syncope or dizziness.  She is fatigued but this is no worse than her typical symptoms.     Studies/Reports Reviewed Today:  Echo 8/15 - Mod LVH. EF 50-55%. Wall motion was normal.  Grade 1 diastolic dysfunction  - Mitral valve: Calcified annulus. Moderately thickened, moderately calcified leaflets . The findings are consistent with mild stenosis. There was trivial regurgitation. - Left atrium: The atrium was mildly dilated.  Myoview 03/2009 Suggestion of anterior ischemia, but subdiaphragmatic nuclear activity is more prominent in stress than rest and may add counts to the inferior wall, making anterior wall appear relatively count poor.   Cardiac CTA 04/2009 Left Main: Short, no significant disease. Left Anterior Descending: The mid LAD had mixed plaque and possible moderate stenosis. Respiratory artifact made interpretation of the mid LAD difficult. The proximal LAD and the distal LAD were free from significant disease. Left Circumflex: This was a relatively small vessel with no definite significant disease found but images were degraded by respiratory artifact. Right Coronary Artery: There was mixed plaque in the mid RCA with mild stenosis. The proximal RCA was degraded by respiratory artifact but there did not appear to be significant disease.  Coronary Calcium Score: 160.8 (100.8 LAD and 60 RCA). This falls within the 86th percentile (high risk) for her age and gender.  IMPRESSION: 1. Poor study due to significant artifact. 2. High risk for age and gender based on calcium score. 3. Possible moderate LAD stenosis.  Past Medical History:  1. Anxiety Disorder  2. Arthritis  3. Depression  4. Retroperitoneal fibrosis with non-Hodgkin's lymphoma. This was treated with chemotherapy in 2006 and is in remission.  5. Adenomatous Colon Polyps  6. Diverticulosis  7. DVT left leg/thigh: in both  2005 and 2006, associated with lymphoma. She is on chronic coumadin.  8. Obesity  9. Left ureteral stent (ureter involved by retroperitoneal fibrosis).  10. CAD: Lexiscan myoview (1/11): EF 49% with global hypokinesis. There appeared to be a mild anterior perfusion defect that could represent ischemia. However, there was significant subdiaphragmatic nuclear activity that was worse in stress than rest so it is possible that the defect was artifactual. This study was equivocal. Coronary CT angiogram was done to follow this up in 2/11. This showed calcium score of 161 (86th percentile for her age and race) and a possible moderate mid LAD stenosis.  11. Mild systolic dysfunction: EF 30% on myoview. Echo (2/11): EF 45-50%, moderate diastolic dysfunction, mild MR, mild biatrial enlargement, PASP 34 mmHg. It is possible that this could be chemotherapy-related. Echo (11/14): EF 50-55%, grade II diastolic dysfunction, ?moderate mitral stenosis, no MR. Echo (8/15): EF 50-55%, moderate LVH, probably mild mitral stenosis with mean gradient 2, normal PA pressure, normal RV.  12. Hyperlipidemia: myalgias with Crestor.  13. Atrial fibrillation: Paroxysmal.  Past Medical History  Diagnosis Date  . DYSLIPIDEMIA 02/27/2009  . DEPRESSION 06/13/2008  . ALLERGIC RHINITIS 08/01/2009  . ABDOMINAL PAIN RIGHT UPPER QUADRANT 11/08/2008  . CARDIOVASCULAR FUNCTION STUDY, ABNORMAL 04/04/2009  . NON-HODGKIN'S LYMPHOMA, HX OF dx  2005--  chemoradiation completed 2006--  no recurrence    onocologist- dr odogwu--   . OSTEOARTHRITIS, MODERATE 04/07/2010  . CAD, NATIVE VESSEL 05/30/2009    cardiologist- dr Aundra Dubin- visit 04-13-2010 in epic-- denies cardiac symptoms  . DYSPNEA 02/27/2009  . Retroperitoneal fibrosis   . Frequency of urination   . History of Lyme disease   . History of Bell's palsy   . DDD (degenerative disc disease), lumbar   . GERD (gastroesophageal reflux disease)   . Anxiety   . Hydronephrosis, left chronic  -- secondary to retroperitoneal fibrosis  . History of DVT of lower extremity 2006-- CHRONIC COUMADIN THERAPY  . Anticoagulated on Coumadin     Past Surgical History  Procedure Laterality Date  . Multiple cysto/ left ureteral stent exchanges  LAST ONE 10-28-10  . Total knee arthroplasty  OCT 2010    RIGHT  . Total knee arthroplasty  JAN 2010    LEFT  . Knee arthroscopy  2005    RIGHT  . Breast mass excision      BENIGN-- RIGHT BREAST  . Retroperitoneal bx  2007  . Exploratory laparotomy  2005    LYMPHOMA  . Tonsillectomy  CHILD  . Cardiovascular stress test  04-04-2009-- PER PT ASYMPTOMATIC-- MEDICAL MANAGEMENT    STUDY WAS EQUIVOCAL/ EF 49%/ GLOBAL HYPOKINESIS/ APPEARS TO BE A MILD ANTERIOR PERFUSION DEFECT COULD REPRESENT ISCHEMIA BUT DUE TO  ACTIVITY WORSE IN STRESS THAN AT REST POSS. THE DEFECT WAS ARTIFACTUAL  . Transthoracic echocardiogram  [id number]    MILD SYSTOLIC DYSFUNCTION/ EF 35-45%/ MODERATE DIASTOLIC DYSFUCTION/ MILD MR/ MILD BIATRIAL ENLARGEMENT  . Ct angiogram  FEB 2011    CALCIUM SCORE 161/ POSS. MODERATE MID LAD STENOSIS  . Cystoscopy w/ ureteral stent removal  04/28/2011    Procedure: CYSTOSCOPY WITH STENT REMOVAL;  Surgeon: Fredricka Bonine, MD;  Location: [facility];  Service: Urology;;  .  Cystoscopy w/ retrogrades  04/28/2011    Procedure: CYSTOSCOPY WITH RETROGRADE PYELOGRAM;  Surgeon: Fredricka Bonine, MD;  Location: [facility];  Service: Urology;  Laterality: Left;     Current Outpatient Prescriptions  Medication Sig Dispense Refill  . ALPRAZolam (XANAX) 1 MG tablet Take 1 tablet (1 mg total) by mouth 2 (two) times daily. 60 tablet 5  . b complex vitamins capsule Take by mouth.    . beta carotene w/minerals (OCUVITE) tablet Take 1 tablet by mouth daily.    Marland Kitchen buPROPion (WELLBUTRIN XL) 300 MG 24 hr tablet take 1 tablet by mouth once daily 90 tablet 1  . chlorhexidine (PERIDEX) 0.12 % solution 5 mLs by Mouth  Rinse route Twice daily.    . Cholecalciferol (VITAMIN D) 2000 UNITS CAPS Take 2,000 Int'l Units by mouth.    . co-enzyme Q-10 30 MG capsule Take 30 mg by mouth daily.     Marland Kitchen diltiazem (CARDIZEM CD) 180 MG 24 hr capsule Take 1 capsule (180 mg total) by mouth 2 (two) times daily. 60 capsule 6  . dronedarone (MULTAQ) 400 MG tablet Take 1 tablet (400 mg total) by mouth 2 (two) times daily with a meal. 60 tablet 1  . fluticasone (FLONASE) 50 MCG/ACT nasal spray instill 2 sprays into each nostril once daily 16 g 6  . lisinopril (PRINIVIL,ZESTRIL) 5 MG tablet take 1 tablet by mouth once daily 90 tablet 0  . loratadine (CLARITIN) 10 MG tablet Take 10 mg by mouth daily.     Marland Kitchen Lysine 500 MG TABS Take 500 mg by mouth daily.    Marland Kitchen NITROSTAT 0.4 MG SL tablet place 1 tablet under the tongue every 5 minutes for UP TO 3 doses if needed 25 tablet 12  . omeprazole (PRILOSEC) 20 MG capsule Take 20 mg by mouth every morning.    Marland Kitchen oxyCODONE (OXY IR/ROXICODONE) 5 MG immediate release tablet Take 1 tablet (5 mg total) by mouth every 6 (six) hours as needed for severe pain. 30 tablet 0  . Pitavastatin Calcium (LIVALO) 2 MG TABS take 1 tablet by mouth once daily 90 tablet 2  . Pregnenolone Micronized POWD by Does not apply route.    . Thyroid 48.75 MG TABS Take by mouth.    . warfarin (COUMADIN) 2.5 MG tablet Take as directed ON MONDAY, WEDNESDAY, AND FRIDAY or as directed 45 tablet 0  . warfarin (COUMADIN) 5 MG tablet Take 5 mg daily except 7.5 mg on MWF or as directed 90 tablet 3   No current facility-administered medications for this visit.    Allergies:   Chlorhexidine base and Penicillins    Social History:  The patient  reports that she quit smoking about 6 years ago. Her smoking use included Cigarettes. She has a 104 pack-year smoking history. She has never used smokeless tobacco. She reports that she drinks alcohol. She reports that she does not use illicit drugs.   Family History:  The patient's family  history includes Arthritis in her mother; Diabetes in her sister; Heart attack in her mother and sister; Heart disease in her mother; Hypertension in her sister; Stroke in her sister.    ROS:   Please see the history of present illness.   Review of Systems  Cardiovascular: Positive for palpitations.  Respiratory: Positive for sleep disturbances due to breathing.   All other systems reviewed and are negative.    PHYSICAL EXAM: VS:  BP 132/69 mmHg  Pulse 94  Ht 5' 6.5" (  1.689 m)  Wt 244 lb (110.678 kg)  BMI 38.80 kg/m2    Wt Readings from Last 3 Encounters:  05/29/14 244 lb (110.678 kg)  05/17/14 242 lb (109.77 kg)  04/24/14 244 lb 12.8 oz (111.041 kg)     GEN: Well nourished, well developed, in no acute distress HEENT: normal Neck: minimal JVD, no masses Cardiac:  Normal S1/S2, irreg irreg rhythm; no murmur, no rubs or gallops, trace edema  Respiratory:  clear to auscultation bilaterally, no wheezing, rhonchi or rales. GI: soft, nontender, nondistended, + BS MS: no deformity or atrophy Skin: warm and dry  Neuro:  CNs II-XII intact, Strength and sensation are intact Psych: Normal affect   EKG:  EKG is ordered today.  It demonstrates:   Atrial fibrillation, HR 94, normal axis   Recent Labs: 02/06/2014: ALT 17; BUN 18; Creatinine 1.2; Hemoglobin 12.3; Platelets 202.0; Potassium 5.2*; Sodium 140; TSH 3.53    Recent Labs  03/05/14 03/05/14 1417 04/24/14 04/24/14 1318  INR 3.3 3.30 2.7 2.70     Lipid Panel    Component Value Date/Time   CHOL 149 02/06/2014 1054   TRIG 85.0 02/06/2014 1054   HDL 63.40 02/06/2014 1054   CHOLHDL 2 02/06/2014 1054   VLDL 17.0 02/06/2014 1054   LDLCALC 69 02/06/2014 1054      ASSESSMENT AND PLAN:  Shortness of breath:    I suspect she is somewhat volume overloaded.  This may be related to AFib.  HR is better controlled.  She does have mod LVH and mild diastolic dysfunction on her recent echo.  She notices a 2 lb weight gain over  the past couple of days.  She may also be having symptoms strictly related to AFib.  We had a long discussion regarding treatment options.  We discussed a trial of Lasix vs proceeding with DCCV now.  She prefers to try the Lasix and see Dr. Aundra Dubin next week as planned.  If her symptoms do not improve, she will call sooner to schedule DCCV.    -  Start Lasix 20 mg QD x 3 days.  Then, take PRN.    -  BMET, BNP today.  Repeat BMET in 1 week.  Palpitations:  This is described as being able to feel her heart beat.  She seems to think that this is a side effect of the Multaq.  I suppose this is possible.  However, I would not stop the drug just yet.  I am more suspicious of her symptoms being from her AFib and possibly volume excess.  Finally, she is now off of Celexa after 15 years.  She may still be getting used to this as well.  Obtain labs as noted.  Continue to monitor.    Paroxysmal atrial fibrillation:  Rate is better controlled.  As noted, we discussed +/- DCCV.  She has her coumadin followed at the [facility].  Her INR has been above 2 for months.  Since we may need to consider DCCV in the near future, I will check her INR to see if she remains therapeutic.    -  PT/INR today.    -  Continue Diltiazem 180 mg Twice daily     -  Continue Multaq 400 mg Twice daily     -  Diurese as noted.    -  If she has persistent symptoms, she will call and we can consider arranging DCCV.  Otherwise, she will FU with Dr. Loralie Champagne next week and decided  at that time if DCCV is needed.   Atherosclerosis of native coronary artery of native heart without angina pectoris:  No angina. She is not on ASA as she is on Coumadin. Continue statin.  HLD (hyperlipidemia):  Continue statin.   Mitral valve disorder:  Mild MS by recent echo.    Current medicines are reviewed at length with the patient today.  The patient has concerns regarding medicines.  The following changes have been made:  As abovel  Labs/ tests  ordered today include:  Orders Placed This Encounter  Procedures  . Basic metabolic panel  . Basic metabolic panel  . B Nat Peptide  . INR/PT  . EKG 12-Lead    Disposition:   FU with Dr. Loralie Champagne next week as planned.   Signed, Versie Starks, MHS 05/29/2014 1:50 PM    [facility] [address], [place], [place]  [address] Phone: [phone]; Fax: [phone]

## 2014-05-30 ENCOUNTER — Ambulatory Visit (INDEPENDENT_AMBULATORY_CARE_PROVIDER_SITE_OTHER): Payer: Self-pay | Admitting: Pharmacist

## 2014-05-30 ENCOUNTER — Telehealth: Payer: Self-pay | Admitting: Family Medicine

## 2014-05-30 DIAGNOSIS — Z86718 Personal history of other venous thrombosis and embolism: Secondary | ICD-10-CM

## 2014-05-30 NOTE — Progress Notes (Signed)
INR = 5.1 drawn at [facility] yesterday (05/29/14) when she was seen by Richardson Dopp, PA for A. Fib & fluid overload. I s/w pt over phone today after I was alerted by Maudie Mercury, RN at [facility] today.  Pt reports no bleeding but she has had larger bruises than usual due to helping her son move in the past 3 weeks. Med change: Started Multaq on 05/17/14.  OFF Celexa (due to potential QTc prolongation w/ the combination).  Multaq does have potential to increase INR > 5 (case reports). Pt also has fluid overload & was given Lasix RX yesterday.  She reports a 9 lb wt loss in 24 hrs.  She said her watchband is looser today.   Cardiology is considering cardioversion but not if INR elevated. Her current dose of Coumadin is 5 mg/day except 7.5 mg on MWF (= 42.5 mg/week).  She did take Coumadin 5 mg last night. INR elevated but pt basically asymptomatic.  She will hold Coumadin today (3/9) & tomorrow (3/10). Pt has appt w/ Dr. Elease Hashimoto w/ [facility] (ph# [phone]; fax# [phone]) on Fri (3/11).  I have contacted their office & s/w representative named Arbie Cookey.  She stated in order for pt to have INR there, Dr. Elease Hashimoto must give permission.  I went ahead and faxed them an order for INR.  They are going to call us back & let us know if it is ok for the lab draw Friday.  If they do this, we will call pt w/ results & dosing instructions.  I would recommend a 25% dose reduction (to ~ 32 mg/week; keeping in mind she will have held 2 doses = 12.5 mg so this week her dose will = 30 mg w/ the doses held). She has an appt for lab/Coumadin clinic at [facility] scheduled already for 06/05/14.  I will not cancel those appts yet.  We can see how she's doing on Friday hopefully. Pt also mentioned she has lab orders from [place], [place] that needed to be drawn and faxed results to their office.  She is going to take that RX to Dr. Erick Blinks office on Friday. NO CHARGE - phone encounter Kennith Center, Pharm.D.,  CPP 05/30/2014@4 :25 PM

## 2014-05-30 NOTE — Telephone Encounter (Signed)
Eliezer Lofts called from [facility] and would like to schedule for [name] to have her PT-INR checked when she comes to see Dr. Elease Hashimoto Friday 06/01/2014. Her INR was 5.1 so that's why they were wanting her to have it checked while she was here on Friday. She has faxed over the orders for these labs. The [facility] will still manage her INR after Friday 06/01/2014.

## 2014-05-30 NOTE — Telephone Encounter (Signed)
OK to check here

## 2014-06-01 ENCOUNTER — Other Ambulatory Visit: Payer: Medicare Other

## 2014-06-01 ENCOUNTER — Telehealth: Payer: Self-pay | Admitting: Family Medicine

## 2014-06-01 ENCOUNTER — Ambulatory Visit (INDEPENDENT_AMBULATORY_CARE_PROVIDER_SITE_OTHER): Payer: Medicare Other | Admitting: Family Medicine

## 2014-06-01 ENCOUNTER — Encounter: Payer: Self-pay | Admitting: Family Medicine

## 2014-06-01 ENCOUNTER — Other Ambulatory Visit: Payer: Self-pay | Admitting: Pharmacist

## 2014-06-01 ENCOUNTER — Ambulatory Visit: Payer: Medicare Other | Admitting: Pharmacist

## 2014-06-01 VITALS — BP 130/80 | HR 75 | Temp 98.6°F | Wt 241.0 lb

## 2014-06-01 DIAGNOSIS — I482 Chronic atrial fibrillation, unspecified: Secondary | ICD-10-CM

## 2014-06-01 DIAGNOSIS — M1810 Unilateral primary osteoarthritis of first carpometacarpal joint, unspecified hand: Secondary | ICD-10-CM

## 2014-06-01 DIAGNOSIS — F411 Generalized anxiety disorder: Secondary | ICD-10-CM | POA: Diagnosis not present

## 2014-06-01 DIAGNOSIS — M19049 Primary osteoarthritis, unspecified hand: Secondary | ICD-10-CM | POA: Diagnosis not present

## 2014-06-01 DIAGNOSIS — I82409 Acute embolism and thrombosis of unspecified deep veins of unspecified lower extremity: Secondary | ICD-10-CM

## 2014-06-01 LAB — POCT INR: INR: 1.8

## 2014-06-01 MED ORDER — DICLOFENAC SODIUM 2 % TD SOLN
1.0000 | Freq: Three times a day (TID) | TRANSDERMAL | Status: DC | PRN
Start: 2014-06-01 — End: 2015-06-04

## 2014-06-01 NOTE — Progress Notes (Signed)
Subjective:    Patient ID: [name], female    DOB: [birth year], 69 y.o.   MRN: [id number]  HPI Patient has multiple chronic problems including obesity, osteoarthritis, history of non-Hodgkin's lymphoma, hyperlipidemia, history of DVT, history depression, cardiomyopathy, atrial fibrillation, chronic anxiety.  She was recently seen by cardiology and noted to be back in atrial fibrillation with rapid rate. She was placed on multaq which has helped to control rate. She was taken off Celexa because of potential interaction. She states she's been on this drug since around 2001. She's had some increased anxiety symptoms since then. She feels her depression is stable. She remains on Wellbutrin. She has been for several years on Xanax 1 mg twice a day.  Recent INR elevated 5.1. She did not take her Coumadin for 2 days. Needing repeat today. INR monitored by cancer center.  Patient has osteoarthritis involving multiple joints. She recently took her friend's Pennsaid which held tremendously with her thumb arthritis. She does not have any history of peptic ulcer disease.  Recent volume overload. Minimally elevated BNP. Cardiology started her on Lasix 20 mg once daily. By home scales her weight was 235 and went down to 232 pounds after 2 days of Lasix. She feels slightly better. Still has some dyspnea with exertion. No chest pains.  Past Medical History  Diagnosis Date  . DYSLIPIDEMIA 02/27/2009  . DEPRESSION 06/13/2008  . ALLERGIC RHINITIS 08/01/2009  . ABDOMINAL PAIN RIGHT UPPER QUADRANT 11/08/2008  . CARDIOVASCULAR FUNCTION STUDY, ABNORMAL 04/04/2009  . NON-HODGKIN'S LYMPHOMA, HX OF dx  2005--  chemoradiation completed 2006--  no recurrence    onocologist- dr odogwu--   . OSTEOARTHRITIS, MODERATE 04/07/2010  . CAD, NATIVE VESSEL 05/30/2009    cardiologist- dr Aundra Dubin- visit 04-13-2010 in epic-- denies cardiac symptoms  . DYSPNEA 02/27/2009  . Retroperitoneal fibrosis   . Frequency of urination     . History of Lyme disease   . History of Bell's palsy   . DDD (degenerative disc disease), lumbar   . GERD (gastroesophageal reflux disease)   . Anxiety   . 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 History of DVT of lower extremity 2006-- CHRONIC COUMADIN THERAPY  . Anticoagulated on Coumadin    Past Surgical History  Procedure Laterality Date  . Multiple cysto/ left ureteral stent exchanges  LAST ONE 10-28-10  . Total knee arthroplasty  OCT 2010    RIGHT  . Total knee arthroplasty  JAN 2010    LEFT  . Knee arthroscopy  2005    RIGHT  . Breast mass excision      BENIGN-- RIGHT BREAST  . Retroperitoneal bx  2007  . Exploratory laparotomy  2005    LYMPHOMA  . Tonsillectomy  CHILD  . Cardiovascular stress test  04-04-2009-- PER PT ASYMPTOMATIC-- MEDICAL MANAGEMENT    STUDY WAS EQUIVOCAL/ EF 49%/ GLOBAL HYPOKINESIS/ APPEARS TO BE A MILD ANTERIOR PERFUSION DEFECT COULD REPRESENT ISCHEMIA BUT DUE TO  ACTIVITY WORSE IN STRESS THAN AT REST POSS. THE DEFECT WAS ARTIFACTUAL  . Transthoracic echocardiogram  [id number]    MILD SYSTOLIC DYSFUNCTION/ EF 22-02%/ MODERATE DIASTOLIC DYSFUCTION/ MILD MR/ MILD BIATRIAL ENLARGEMENT  . Ct angiogram  FEB 2011    CALCIUM SCORE 161/ POSS. MODERATE MID LAD STENOSIS  . Cystoscopy w/ ureteral stent removal  04/28/2011    Procedure: CYSTOSCOPY WITH STENT REMOVAL;  Surgeon: Fredricka Bonine, MD;  Location: [facility];  Service: Urology;;  . Cystoscopy w/ retrogrades  04/28/2011    Procedure: CYSTOSCOPY WITH RETROGRADE PYELOGRAM;  Surgeon: Fredricka Bonine, MD;  Location: [facility];  Service: Urology;  Laterality: Left;    reports that she quit smoking about 6 years ago. Her smoking use included Cigarettes. She has a 104 pack-year smoking history. She has never used smokeless tobacco. She reports that she drinks alcohol. She reports that she does not use illicit drugs. family history  includes Arthritis in her mother; Diabetes in her sister; Heart attack in her mother and sister; Heart disease in her mother; Hypertension in her sister; Stroke in her sister. Allergies  Allergen Reactions  . Chlorhexidine Base Itching    CHG WIPES  . Penicillins Hives and Rash      Review of Systems  Constitutional: Negative for fever, chills, appetite change and unexpected weight change.  Respiratory: Positive for shortness of breath.   Cardiovascular: Positive for leg swelling. Negative for chest pain and palpitations.  Psychiatric/Behavioral: Negative for dysphoric mood and agitation. The patient is nervous/anxious.        Objective:   Physical Exam  Constitutional: She is oriented to person, place, and time. She appears well-developed and well-nourished.  Neck: Neck supple. No JVD present.  Cardiovascular: Normal rate and regular rhythm.   Pulmonary/Chest: Effort normal and breath sounds normal. No respiratory distress. She has no wheezes. She has no rales.  Musculoskeletal:  Trace edema legs bilaterally  Neurological: She is alert and oriented to person, place, and time.          Assessment & Plan:  #1 history of chronic anxiety. She was recently taken off SSRI (Celexa) because of her new cardiac medication-Multaq.  She cannot take any SSRIs with this medication. She has for many years been on Xanax 1 mg twice a day. We explained our reservations for increasing dosage. She's had counseling in the past. We discussed nonpharmacologic ways of handling anxiety. She's had tremendous marital discord which she states is a big source of her anxiety #2 atrial fibrillation. Patient on chronic Coumadin and recent over anticoagulation. INR today 1.8. Results faxed to cancer treatment Center #3 osteoarthritis involving multiple joints. Patient requesting prescription for Pennsaid.  We discussed cautions even with topical nonsteroidals with Coumadin. She has already been using this from  her friend and states this is working very well requesting refills. Monitor closely for GI symptoms.

## 2014-06-01 NOTE — Progress Notes (Signed)
**  Telephone encounter-no charge**  INR decreased to from 5.1 to 1.8 after hold coumadin for 2 days.  Will resume coumadin at 5mg  daily about a 20% decrease from previous dose.  INR increase may have been attributed to DI with coumadin and Multaq as stated in previous coumadin clinic note.  Will keep scheduled coumadin clinic appt on 3/15 and also check a BMET since on Lasix.  These results to be evaluated by Dr. Elease Hashimoto at [facility].

## 2014-06-01 NOTE — Progress Notes (Signed)
Pre visit review using our clinic review tool, if applicable. No additional management support is needed unless otherwise documented below in the visit note. 

## 2014-06-01 NOTE — Telephone Encounter (Signed)
PA was submitted via cover my meds for Diclofenac Sodium (PENNSAID) 2 % SOLN.  ZBM#[id number]

## 2014-06-05 ENCOUNTER — Ambulatory Visit: Payer: Medicare Other

## 2014-06-05 ENCOUNTER — Other Ambulatory Visit: Payer: Medicare Other

## 2014-06-05 ENCOUNTER — Telehealth: Payer: Self-pay | Admitting: Pharmacist

## 2014-06-05 NOTE — Telephone Encounter (Signed)
[name] called the Coumadin clinic today stated she will need to cancel her lab and coumadin visit due to a family matter. She states she will call back tomorrow to reschedule when she knows her schedule.  Thank you,  Montel Clock, PharmD, BCOp

## 2014-06-08 ENCOUNTER — Ambulatory Visit (INDEPENDENT_AMBULATORY_CARE_PROVIDER_SITE_OTHER): Payer: Medicare Other | Admitting: Pharmacist

## 2014-06-08 ENCOUNTER — Telehealth: Payer: Self-pay | Admitting: *Deleted

## 2014-06-08 ENCOUNTER — Ambulatory Visit (INDEPENDENT_AMBULATORY_CARE_PROVIDER_SITE_OTHER): Payer: Medicare Other | Admitting: Cardiology

## 2014-06-08 ENCOUNTER — Encounter: Payer: Self-pay | Admitting: Cardiology

## 2014-06-08 ENCOUNTER — Telehealth: Payer: Self-pay | Admitting: Pharmacist

## 2014-06-08 ENCOUNTER — Telehealth: Payer: Self-pay | Admitting: Cardiology

## 2014-06-08 VITALS — BP 122/60 | HR 78 | Ht 66.5 in | Wt 239.0 lb

## 2014-06-08 DIAGNOSIS — I480 Paroxysmal atrial fibrillation: Secondary | ICD-10-CM | POA: Diagnosis not present

## 2014-06-08 DIAGNOSIS — I2510 Atherosclerotic heart disease of native coronary artery without angina pectoris: Secondary | ICD-10-CM | POA: Diagnosis not present

## 2014-06-08 DIAGNOSIS — I4891 Unspecified atrial fibrillation: Secondary | ICD-10-CM | POA: Diagnosis not present

## 2014-06-08 DIAGNOSIS — Z5181 Encounter for therapeutic drug level monitoring: Secondary | ICD-10-CM | POA: Diagnosis not present

## 2014-06-08 DIAGNOSIS — I5032 Chronic diastolic (congestive) heart failure: Secondary | ICD-10-CM

## 2014-06-08 DIAGNOSIS — Z86718 Personal history of other venous thrombosis and embolism: Secondary | ICD-10-CM

## 2014-06-08 LAB — BASIC METABOLIC PANEL
BUN: 30 mg/dL — ABNORMAL HIGH (ref 6–23)
CO2: 22 meq/L (ref 19–32)
Calcium: 10.2 mg/dL (ref 8.4–10.5)
Chloride: 103 mEq/L (ref 96–112)
Creat: 1.4 mg/dL — ABNORMAL HIGH (ref 0.50–1.10)
Glucose, Bld: 131 mg/dL — ABNORMAL HIGH (ref 70–99)
Potassium: 4.6 mEq/L (ref 3.5–5.3)
SODIUM: 138 meq/L (ref 135–145)

## 2014-06-08 LAB — POCT INR: INR: 2

## 2014-06-08 MED ORDER — LOSARTAN POTASSIUM 25 MG PO TABS: 25.0000 mg | ORAL_TABLET | Freq: Every day | ORAL | Status: DC

## 2014-06-08 NOTE — Telephone Encounter (Signed)
Spoke with Blanche East  Pharmacist at [facility] and gave results of POC  INR 2.0 and also best number to reach pt [phone] and she states she will call pt and dose her coumadin.

## 2014-06-08 NOTE — Telephone Encounter (Signed)
I called pt today to check in & she reported she is going to see Dr. Aundra Dubin at [facility] on [address] at 3:45 pm.  She'd like her INR drawn there since she missed her lab/Coumadin clinic appts here this week on Tues (3/15).  She said she may also need CMET/BMET for Dr. Erick Blinks office but she'd discuss this w/ Dr. Claris Gladden staff this afternoon. I contacted their office & s/w Elbert Ewings, RN for Coumadin Clinic there (ph# [phone]).  She agreed to check the PT/INR today for [place] and she'll fax over the results to [place] to manage. Mrs. Losada knows we'll call her w/ INR results & dosing instructions once they're made available. She is currently taking Coumadin 5 mg daily. Kennith Center, Pharm.D., CPP 06/08/2014@10 :59 AM

## 2014-06-08 NOTE — Progress Notes (Signed)
INR = 2 (drawn at [facility]) today Coumadin dose is now 5 mg daily. I s/w pt over phone (NO CHARGE encounter) just after she saw Dr. Aundra Dubin today and she reports med change: off Lisinopril; starting Losartan. Losartan is 2C9 Inhibitor of Warfarin and has potential to increase INR. Pt is staying on Multaq. INR at goal but w/ Losartan addition, the INR could increase more.  She will stay on Coumadin 5 mg daily and we'll recheck her INR on 06/27/14. Kennith Center, Pharm.D., CPP 06/08/2014@4 :57 PM

## 2014-06-08 NOTE — Patient Instructions (Signed)
Stop lisinopril.  Start losartan 25mg  daily.  Your physician recommends that you have lab today--BMET.  Your physician recommends that you schedule a follow-up appointment in: 3 months with Dr Aundra Dubin.

## 2014-06-08 NOTE — Telephone Encounter (Signed)
New message      Pt has a 3:45 appt.  She said she was going to have labs drawn prior to appt but did not get a chance to do that (pt/inr and bmet).  Is it too late to get them drawn prior to appt?

## 2014-06-08 NOTE — Telephone Encounter (Signed)
Pt advised will get PT/INR and other lab at appt time.

## 2014-06-10 DIAGNOSIS — I5032 Chronic diastolic (congestive) heart failure: Secondary | ICD-10-CM | POA: Insufficient documentation

## 2014-06-10 NOTE — Progress Notes (Signed)
Patient ID: [name], female   DOB: 05/24/1945, 69 y.o.   MRN: [id number] PCP: Dr. Elease Hashimoto  69 yo with history of retroperitoneal fibrosis and non-Hodgkin's lymphoma as well as prior DVT presented initially for evaluation of an abnormal myoview.  A Lexiscan myoview was done in 1/11. This was an equivocal study due to subdiaphragmatic nuclear uptake. EF was calculated to be 49% with global hypokinesis. Given the equivocal myoview, I had her do a coronary CT angiogram. This showed a moderate calcium score of 161 (putting her at a high risk percentile for her age) and a possible moderate mid LAD stenosis (though some degradation of images by respiratory artifact). Echo was done to reassess LV systolic function (reported as mildly decreased on myoview). This confirmed mild LV systolic dysfunction, EF 34-19% with moderate diastolic dysfunction. We discussed catheterization, but patient strongly preferred medical treatment.   She has been diagnosed with paroxysmal atrial fibrillation.  She was started on warfarin and diltiazem CD.  Last echo in 8/15 showed EF 50-55%, moderate LV hypertrophy, probably mild mitral stenosis, normal RV size and systolic function.    At last appointment, she was in atrial fibrillation.  I started her on dronedarone.  Today she is back in NSR.  She has been on Lasix and is breathing much better.   She can walk 1 block without dyspnea, no problems walking around her house.  She uses a cane for stability.  No chest pain.  No orthopnea/PND.  Weight is down 5 lbs.  She has developed a chronic cough.   Labs (12/10): HCT 34.5, K 4.3, creatinine 1.2, LDL 101, HDL 46  Labs (7/11): LFTs normal, LDL 69, HDL 47, BNP 21, K 4.3, creatinine 1.2  Labs (1/12): K 5.2, creatinine 1.0, LFTs normal, LDL 59, HDL 46  Labs (6/13): LDL 54, HDL 52, K 4.7, creatinine 1.1 Labs (9/14): LDL 79, HDL 56 Labs (4/15): TSH normal Labs (11/15): K 5.2, creatinine 1.2, LDL 69, HDL 63 Labs (3/16): BNP 168,  K 4, creatinine 1.08  ECG: NSR, QTc 438 msec  Past Medical History:   1. Anxiety Disorder  2. Arthritis  3. Depression  4. Retroperitoneal fibrosis with non-Hodgkin's lymphoma. This was treated with chemotherapy in 2006 and is in remission.  5. Adenomatous Colon Polyps  6. Diverticulosis  7. DVT left leg/thigh: in both 2005 and 2006, associated with lymphoma. She is on chronic coumadin.  8. Obesity  9. Left ureteral stent (ureter involved by retroperitoneal fibrosis).  10. CAD: Lexiscan myoview (1/11): EF 49% with global hypokinesis. There appeared to be a mild anterior perfusion defect that could represent ischemia. However, there was significant subdiaphragmatic nuclear activity that was worse in stress than rest so it is possible that the defect was artifactual. This study was equivocal. Coronary CT angiogram was done to follow this up in 2/11. This showed calcium score of 161 (86th percentile for her age and race) and a possible moderate mid LAD stenosis.  11. Mild systolic dysfunction: EF 62% on myoview. Echo (2/11): EF 45-50%, moderate diastolic dysfunction, mild MR, mild biatrial enlargement, PASP 34 mmHg. It is possible that this could be chemotherapy-related.  Echo (11/14): EF 50-55%, grade II diastolic dysfunction, ?moderate mitral stenosis, no MR. Echo (8/15): EF 50-55%, moderate LVH, probably mild mitral stenosis with mean gradient 2, normal PA pressure, normal RV.  12. Hyperlipidemia: myalgias with Crestor.  13. Atrial fibrillation: Paroxysmal.   Family History:  Family History of Arthritis mother  Family History Diabetes 1st degree  relative deceased sister  Family History Hypertension deceased sister  Family History of Stroke F 1st degree relative <60 deceased sister  Family History of Cardiovascular disorder mother  Sister had MI and CVA at 77  Mother with CABG in her 36s   Social History:  Retired  Married  Quit smoking 5/10  Alcohol use-yes, rarely  Regular  exercise-no   Review of Systems  All systems reviewed and negative except as per HPI.   Current Outpatient Prescriptions  Medication Sig Dispense Refill  . ALPRAZolam (XANAX) 1 MG tablet Take 1 tablet (1 mg total) by mouth 2 (two) times daily. (Patient taking differently: Take 1 mg by mouth 3 (three) times daily. ) 60 tablet 5  . b complex vitamins capsule Take by mouth.    . beta carotene w/minerals (OCUVITE) tablet Take 1 tablet by mouth daily.    Marland Kitchen buPROPion (WELLBUTRIN XL) 300 MG 24 hr tablet take 1 tablet by mouth once daily 90 tablet 1  . chlorhexidine (PERIDEX) 0.12 % solution 5 mLs by Mouth Rinse route Twice daily.    . Cholecalciferol (VITAMIN D) 2000 UNITS CAPS Take 2,000 Int'l Units by mouth.    . co-enzyme Q-10 30 MG capsule Take 30 mg by mouth daily.     . Diclofenac Sodium (PENNSAID) 2 % SOLN Place 1 application onto the skin 3 (three) times daily as needed. 112 g 3  . diltiazem (CARDIZEM CD) 180 MG 24 hr capsule Take 1 capsule (180 mg total) by mouth 2 (two) times daily. 60 capsule 6  . dronedarone (MULTAQ) 400 MG tablet Take 1 tablet (400 mg total) by mouth 2 (two) times daily with a meal. 60 tablet 1  . fluticasone (FLONASE) 50 MCG/ACT nasal spray instill 2 sprays into each nostril once daily 16 g 6  . furosemide (LASIX) 20 MG tablet Take 1 tablet daily as needed for swelling 30 tablet 0  . loratadine (CLARITIN) 10 MG tablet Take 10 mg by mouth daily.     Marland Kitchen Lysine 500 MG TABS Take 500 mg by mouth daily.    Marland Kitchen NITROSTAT 0.4 MG SL tablet place 1 tablet under the tongue every 5 minutes for UP TO 3 doses if needed 25 tablet 12  . omeprazole (PRILOSEC) 20 MG capsule Take 20 mg by mouth every morning.    Marland Kitchen oxyCODONE (OXY IR/ROXICODONE) 5 MG immediate release tablet Take 1 tablet (5 mg total) by mouth every 6 (six) hours as needed for severe pain. 30 tablet 0  . Pitavastatin Calcium (LIVALO) 2 MG TABS take 1 tablet by mouth once daily 90 tablet 2  . Pregnenolone Micronized POWD by  Does not apply route.    . Thyroid 48.75 MG TABS Take by mouth.    . warfarin (COUMADIN) 2.5 MG tablet Take as directed ON MONDAY, WEDNESDAY, AND FRIDAY or as directed 45 tablet 0  . warfarin (COUMADIN) 5 MG tablet Take 5 mg daily except 7.5 mg on MWF or as directed 90 tablet 3  . losartan (COZAAR) 25 MG tablet Take 1 tablet (25 mg total) by mouth daily. 90 tablet 3   No current facility-administered medications for this visit.   BP 122/60 mmHg  Pulse 78  Ht 5' 6.5" (1.689 m)  Wt 239 lb (108.41 kg)  BMI 38.00 kg/m2  SpO2 98% General: NAD, obese.  Neck: No JVD, no thyromegaly or thyroid nodule.  Lungs: Clear to auscultation bilaterally with normal respiratory effort. CV: Nondisplaced PMI.  Heart regular S1/S2, no  S3/S4, no murmur.  Bilateral varicosities with trace ankle edema.  No carotid bruit.  Normal pedal pulses.  Abdomen: Soft, nontender, no hepatosplenomegaly, no distention.  Neurologic: Alert and oriented x 3.  Psych: Normal affect. Extremities: No clubbing or cyanosis.   Assessment/Plan: 1. Atrial fibrillation: Paroxysmal.  She is in NSR today.  - Continue dronedarone for maintenance of NSR.  - Continue warfarin (has preferred this to NOAC).  - Continue current diltiazem CD.  2. CAD: She is on warfarin so not on ASA.  No chest pain. Continue statin.  3. Hyperlipidemia: Good lipids in 11/15.  Goal LDL < 70 given known CAD from coronary CTA in past.  4. Mitral stenosis: Reported on echo from outside lab.  We reassessed this by echo here in 8/15, suspect no more than mild mitral stenosis.  5. Chronic cough: Possibly due to ACEI.  Stop lisinopril, start losartan 25 mg daily with BMET 10 days.  6. Chronic diastolic CHF: Likely triggered by atrial fibrillation.  She is breathing better back in NSR and on Lasix. Weight is down 5 lbs.  Continue Lasix and check BMET today.   Loralie Champagne 06/10/2014

## 2014-06-11 ENCOUNTER — Other Ambulatory Visit: Payer: Self-pay | Admitting: *Deleted

## 2014-06-11 DIAGNOSIS — I480 Paroxysmal atrial fibrillation: Secondary | ICD-10-CM

## 2014-06-11 DIAGNOSIS — I429 Cardiomyopathy, unspecified: Secondary | ICD-10-CM

## 2014-06-11 NOTE — Addendum Note (Signed)
Addended by: Katrine Coho on: 06/11/2014 08:07 AM   Modules accepted: Orders

## 2014-06-18 ENCOUNTER — Other Ambulatory Visit (INDEPENDENT_AMBULATORY_CARE_PROVIDER_SITE_OTHER): Payer: Medicare Other | Admitting: *Deleted

## 2014-06-18 DIAGNOSIS — I429 Cardiomyopathy, unspecified: Secondary | ICD-10-CM | POA: Diagnosis not present

## 2014-06-18 DIAGNOSIS — I480 Paroxysmal atrial fibrillation: Secondary | ICD-10-CM | POA: Diagnosis not present

## 2014-06-18 LAB — BASIC METABOLIC PANEL
BUN: 18 mg/dL (ref 6–23)
CO2: 29 mEq/L (ref 19–32)
CREATININE: 1.16 mg/dL (ref 0.40–1.20)
Calcium: 9.4 mg/dL (ref 8.4–10.5)
Chloride: 104 mEq/L (ref 96–112)
GFR: 49.05 mL/min — AB (ref >60.00)
Glucose, Bld: 131 mg/dL — ABNORMAL HIGH (ref 70–99)
Potassium: 4.1 mEq/L (ref 3.5–5.1)
Sodium: 137 mEq/L (ref 135–145)

## 2014-06-19 ENCOUNTER — Telehealth: Payer: Self-pay | Admitting: Family Medicine

## 2014-06-19 NOTE — Telephone Encounter (Signed)
Pt was seen on 3-11 and md increase xanax to 3 pills a day. Pt needs new rx call into [facility] [address]

## 2014-06-19 NOTE — Telephone Encounter (Signed)
Last refill #60 5 refills

## 2014-06-20 MED ORDER — ALPRAZOLAM 1 MG PO TABS: 1.0000 mg | ORAL_TABLET | Freq: Three times a day (TID) | ORAL | Status: DC

## 2014-06-20 NOTE — Telephone Encounter (Signed)
Refill #90 with 5 refills.

## 2014-06-20 NOTE — Telephone Encounter (Signed)
Called in Rx to pharmacy.

## 2014-06-21 ENCOUNTER — Other Ambulatory Visit: Payer: Medicare Other

## 2014-06-25 ENCOUNTER — Other Ambulatory Visit: Payer: Self-pay | Admitting: *Deleted

## 2014-06-25 DIAGNOSIS — I429 Cardiomyopathy, unspecified: Secondary | ICD-10-CM

## 2014-06-25 DIAGNOSIS — I480 Paroxysmal atrial fibrillation: Secondary | ICD-10-CM

## 2014-06-25 MED ORDER — FUROSEMIDE 20 MG PO TABS: ORAL_TABLET | ORAL | Status: DC

## 2014-06-26 ENCOUNTER — Other Ambulatory Visit (HOSPITAL_BASED_OUTPATIENT_CLINIC_OR_DEPARTMENT_OTHER): Payer: Medicare Other

## 2014-06-26 ENCOUNTER — Ambulatory Visit (HOSPITAL_BASED_OUTPATIENT_CLINIC_OR_DEPARTMENT_OTHER): Payer: Medicare Other | Admitting: Pharmacist

## 2014-06-26 ENCOUNTER — Telehealth: Payer: Self-pay | Admitting: Cardiology

## 2014-06-26 DIAGNOSIS — I5032 Chronic diastolic (congestive) heart failure: Secondary | ICD-10-CM

## 2014-06-26 DIAGNOSIS — I82409 Acute embolism and thrombosis of unspecified deep veins of unspecified lower extremity: Secondary | ICD-10-CM

## 2014-06-26 DIAGNOSIS — Z86718 Personal history of other venous thrombosis and embolism: Secondary | ICD-10-CM

## 2014-06-26 DIAGNOSIS — I480 Paroxysmal atrial fibrillation: Secondary | ICD-10-CM

## 2014-06-26 DIAGNOSIS — I482 Chronic atrial fibrillation, unspecified: Secondary | ICD-10-CM

## 2014-06-26 LAB — BASIC METABOLIC PANEL (CC13)
ANION GAP: 10 meq/L (ref 3–11)
BUN: 15.3 mg/dL (ref 7.0–26.0)
CALCIUM: 8.9 mg/dL (ref 8.4–10.4)
CO2: 24 mEq/L (ref 22–29)
Chloride: 107 mEq/L (ref 98–109)
Creatinine: 1.1 mg/dL (ref 0.6–1.1)
EGFR: 50 mL/min/{1.73_m2} — ABNORMAL LOW (ref >90)
Glucose: 145 mg/dl — ABNORMAL HIGH (ref 70–140)
Potassium: 4.4 mEq/L (ref 3.5–5.1)
SODIUM: 141 meq/L (ref 136–145)

## 2014-06-26 LAB — PROTIME-INR
INR: 3.2 (ref 2.00–3.50)
Protime: 38.4 Seconds — ABNORMAL HIGH (ref 10.6–13.4)

## 2014-06-26 LAB — POCT INR: INR: 3.2

## 2014-06-26 NOTE — Progress Notes (Signed)
INR right at goal today at 3.2 (Goal 2-3) Pt is doing well but does have some general complaints regarding some of her medications She is now on Multaq and was discontinued from celexa due to interaction This has caused mood swings for her that are not normal I provided information to her on cymbalta as a potential alternative as this has no interactions with multaq due to slightly different mechanism compared to standard SSRIs No issues regarding anticoagulation No unusual bleeding or bruising No diet or other medication changes INR appears to be stabilizing after elevated INR earlier this month due to medication changes Ms. Provencher will be having INR checked at Dr. Claris Gladden office next week with other labs on 4/13  Plan: Take half tab tonight (2.5 mg)  Then Continue Coumadin 5mg  daily.   Recheck PT/INR on 07/04/14 at Dr. Claris Gladden office. We will call you with results

## 2014-06-26 NOTE — Patient Instructions (Addendum)
INR right at goal Take half tab tonight (2.5 mg)  Then Continue Coumadin 5mg  daily.   Recheck PT/INR on 07/04/14 at Dr. Aundra Dubin office. We will call you with results

## 2014-06-26 NOTE — Telephone Encounter (Signed)
Pt advised, verbalized understanding, BMET scheduled for 07/04/14.

## 2014-06-26 NOTE — Telephone Encounter (Signed)
Go back to Lasix 20 mg daily with BMET in 1 week.Marland Kitchen

## 2014-06-26 NOTE — Telephone Encounter (Signed)
Pt states since lasix decreased 06/11/14 to 20mg  every other day, she has increased SOB, increased lower extremity edema, increase in tiredness but no change in weight.  Pt advised I will forward to Dr Aundra Dubin for review.

## 2014-06-26 NOTE — Telephone Encounter (Signed)
Pt calling re Lasix being decreased--now has edema legs and feet, not voiding like normal, SOB with excertion  pls advise [phone]

## 2014-06-27 ENCOUNTER — Ambulatory Visit: Payer: Medicare Other

## 2014-06-27 ENCOUNTER — Other Ambulatory Visit: Payer: Medicare Other

## 2014-06-29 ENCOUNTER — Telehealth: Payer: Self-pay | Admitting: Family Medicine

## 2014-06-29 MED ORDER — DULOXETINE HCL 30 MG PO CPEP: 30.0000 mg | ORAL_CAPSULE | Freq: Every day | ORAL | Status: DC

## 2014-06-29 MED ORDER — DULOXETINE HCL 60 MG PO CPEP: 60.0000 mg | ORAL_CAPSULE | Freq: Every day | ORAL | Status: DC

## 2014-06-29 NOTE — Telephone Encounter (Signed)
Rx sent to pharmacy. Pt is aware.  

## 2014-06-29 NOTE — Telephone Encounter (Signed)
cymbalta 30 mg once daily for one week and then increase to 60 mg

## 2014-06-29 NOTE — Telephone Encounter (Addendum)
Pt was on celexa and was taken off med due to drug interaction. Pt is weepy and pharm suggest she try cymbalta. [facility] [address] [place]

## 2014-07-04 ENCOUNTER — Other Ambulatory Visit: Payer: Medicare Other

## 2014-07-05 ENCOUNTER — Other Ambulatory Visit (INDEPENDENT_AMBULATORY_CARE_PROVIDER_SITE_OTHER): Payer: Medicare Other | Admitting: *Deleted

## 2014-07-05 DIAGNOSIS — I5032 Chronic diastolic (congestive) heart failure: Secondary | ICD-10-CM | POA: Diagnosis not present

## 2014-07-05 DIAGNOSIS — I480 Paroxysmal atrial fibrillation: Secondary | ICD-10-CM

## 2014-07-05 LAB — BASIC METABOLIC PANEL
BUN: 18 mg/dL (ref 6–23)
CO2: 31 meq/L (ref 19–32)
Calcium: 9.5 mg/dL (ref 8.4–10.5)
Chloride: 101 mEq/L (ref 96–112)
Creatinine, Ser: 1.12 mg/dL (ref 0.40–1.20)
GFR: 51.07 mL/min — AB (ref >60.00)
GLUCOSE: 114 mg/dL — AB (ref 70–99)
POTASSIUM: 4.1 meq/L (ref 3.5–5.1)
Sodium: 137 mEq/L (ref 135–145)

## 2014-07-05 NOTE — Addendum Note (Signed)
Addended by: Eulis Foster on: 07/05/2014 01:53 PM   Modules accepted: Orders

## 2014-07-10 ENCOUNTER — Telehealth: Payer: Self-pay | Admitting: Cardiology

## 2014-07-10 DIAGNOSIS — I480 Paroxysmal atrial fibrillation: Secondary | ICD-10-CM

## 2014-07-10 MED ORDER — DRONEDARONE HCL 400 MG PO TABS: 400.0000 mg | ORAL_TABLET | Freq: Two times a day (BID) | ORAL | Status: DC

## 2014-07-10 NOTE — Telephone Encounter (Signed)
Should really take twice daily with food.  May cause some issues with having it in her system only half the time.

## 2014-07-10 NOTE — Telephone Encounter (Signed)
Pt states she has been taking Multaq 400mg  only one time daily for a couple of months, she did not realize it should be two times a day until she went to get a refill. Pt states she has not noticed any changes in her symptoms and is asking if Ok to continue taking one time daily.   I will forward to Dr Aundra Dubin for review.

## 2014-07-10 NOTE — Telephone Encounter (Signed)
New message     Pt c/o medication issue:  1. Name of Medication: multaq 2. How are you currently taking this medication (dosage and times per day)? 400mg  once a day 3. Are you having a reaction (difficulty breathing--STAT)? no  4. What is your medication issue?  Pt went to get her presc refilled and found out she should have been taking 400mg  twice a day.  She has been taking 1 tablet daily since feb.  The cost of the presc at refill is 200+ dollars.  She want to know if she can continue taking 1 daily or is it necessary to continue taking the medication?

## 2014-07-10 NOTE — Telephone Encounter (Signed)
LMTCB

## 2014-07-10 NOTE — Telephone Encounter (Signed)
Pt advised,verbalized understanding. 

## 2014-07-13 ENCOUNTER — Ambulatory Visit (HOSPITAL_BASED_OUTPATIENT_CLINIC_OR_DEPARTMENT_OTHER): Payer: Medicare Other | Admitting: Pharmacist

## 2014-07-13 ENCOUNTER — Other Ambulatory Visit (HOSPITAL_BASED_OUTPATIENT_CLINIC_OR_DEPARTMENT_OTHER): Payer: Medicare Other

## 2014-07-13 DIAGNOSIS — I82403 Acute embolism and thrombosis of unspecified deep veins of lower extremity, bilateral: Secondary | ICD-10-CM | POA: Diagnosis not present

## 2014-07-13 DIAGNOSIS — I82409 Acute embolism and thrombosis of unspecified deep veins of unspecified lower extremity: Secondary | ICD-10-CM

## 2014-07-13 DIAGNOSIS — I482 Chronic atrial fibrillation, unspecified: Secondary | ICD-10-CM

## 2014-07-13 LAB — PROTIME-INR
INR: 2.9 (ref 2.00–3.50)
PROTIME: 34.8 s — AB (ref 10.6–13.4)

## 2014-07-13 LAB — POCT INR: INR: 2.9

## 2014-07-13 NOTE — Progress Notes (Signed)
INR at goal today at 2.9 (goal of 2-3) Pt is doing well She recently started on cymbalta and initially she says she felt great She has now increased up to 60 mg daily but says she is not feeling "as great" as when she first started No unusual bleeding or bruising No diet or other medication changes No missed or extra doses Plan: No changes Continue Coumadin 5mg  daily.   Recheck PT/INR on 08/02/14 at 2:30 pm for lab, and 2:45pm for coumadin clinic

## 2014-07-13 NOTE — Patient Instructions (Signed)
INR at goal No changes Continue Coumadin 5mg  daily.   Recheck PT/INR on 08/02/14 at 2:30 pm for lab, and 2:45pm for coumadin clinic

## 2014-07-24 DIAGNOSIS — R5383 Other fatigue: Secondary | ICD-10-CM | POA: Diagnosis not present

## 2014-07-24 DIAGNOSIS — E559 Vitamin D deficiency, unspecified: Secondary | ICD-10-CM | POA: Diagnosis not present

## 2014-07-24 DIAGNOSIS — E039 Hypothyroidism, unspecified: Secondary | ICD-10-CM | POA: Diagnosis not present

## 2014-07-24 DIAGNOSIS — F329 Major depressive disorder, single episode, unspecified: Secondary | ICD-10-CM | POA: Diagnosis not present

## 2014-08-02 ENCOUNTER — Ambulatory Visit: Payer: Medicare Other

## 2014-08-02 ENCOUNTER — Other Ambulatory Visit: Payer: Medicare Other

## 2014-08-03 ENCOUNTER — Telehealth: Payer: Self-pay | Admitting: *Deleted

## 2014-08-03 DIAGNOSIS — Z880 Allergy status to penicillin: Secondary | ICD-10-CM | POA: Diagnosis not present

## 2014-08-03 DIAGNOSIS — I10 Essential (primary) hypertension: Secondary | ICD-10-CM | POA: Diagnosis not present

## 2014-08-03 DIAGNOSIS — Z79899 Other long term (current) drug therapy: Secondary | ICD-10-CM | POA: Diagnosis not present

## 2014-08-03 DIAGNOSIS — J029 Acute pharyngitis, unspecified: Secondary | ICD-10-CM | POA: Diagnosis not present

## 2014-08-03 DIAGNOSIS — K219 Gastro-esophageal reflux disease without esophagitis: Secondary | ICD-10-CM | POA: Diagnosis not present

## 2014-08-03 DIAGNOSIS — Z96653 Presence of artificial knee joint, bilateral: Secondary | ICD-10-CM | POA: Diagnosis not present

## 2014-08-03 NOTE — Telephone Encounter (Signed)
Left message for patient to return call.

## 2014-08-03 NOTE — Telephone Encounter (Signed)
Attempted to contact patient. Left message on voicemail to call back.   --------------------------------------------------------------------------------------------------------------------------------------------------------------------------------------------------------- PLEASE NOTE: All timestamps contained within this report are represented as [place] Standard Time. CONFIDENTIALTY NOTICE: This fax transmission is intended only for the addressee. It contains information that is legally privileged, confidential or otherwise protected from use or disclosure. If you are not the intended recipient, you are strictly prohibited from reviewing, disclosing, copying using or disseminating any of this information or taking any action in reliance on or regarding this information. If you have received this fax in error, please notify us immediately by telephone so that we can arrange for its return to [place]. Phone: [phone], Toll-Free: [phone], Fax: [phone] Page: 1 of 1 Call Id: [id number] [facility] - Client [place] Patient Name: [name] Gender: Female DOB: 1946/03/14 Age: 69 Y 68 M 21 D Return Phone Number: [phone] (Primary) Address: [address] City/State/Zip: [place] [address] Client [facility] - Client Client [facility] Day Physician Carolann Littler Contact Type Call Call Type Triage / Clinical Relationship To Patient Self Appointment Disposition EMR Caller Not Reached Info pasted into Epic No Return Phone Number [phone] (Primary) Chief Complaint Swallowing Difficulty Initial Comment Caller states she has a sore throat and she she can't swallow her saliva or heart medication Nurse Assessment Guidelines Guideline Title Affirmed Question Affirmed Notes Nurse Date/Time (Eastern Time) Disp. Time Eilene Ghazi Time) Disposition  Final User 08/03/2014 1:06:53 PM Attempt made - message left Justine Null, RN, Rodena Piety 08/03/2014 1:28:46 PM Attempt made - message left Justine Null, RN, Rodena Piety 08/03/2014 1:45:18 PM FINAL ATTEMPT MADE - message left Yes Justine Null, RN, Rodena Piety After Care Instructions Given Call Event Type User Date / Time Description

## 2014-08-06 NOTE — Telephone Encounter (Signed)
Please advise 

## 2014-08-06 NOTE — Telephone Encounter (Signed)
Called and spoke to patient. Went to ED over weekend. Has found relief and now able to swallow. Patient states has persistent, hard, dry cough now and requesting something for it.

## 2014-08-06 NOTE — Telephone Encounter (Signed)
Tessalon perles 200 mg po Q 8 hours prn cough #30.Marland Kitchen Needs office assess if no better with that.

## 2014-08-07 MED ORDER — BENZONATATE 200 MG PO CAPS: 200.0000 mg | ORAL_CAPSULE | Freq: Three times a day (TID) | ORAL | Status: DC | PRN

## 2014-08-07 NOTE — Telephone Encounter (Signed)
Pt informed. Rx sent to pharmacy  

## 2014-08-09 ENCOUNTER — Other Ambulatory Visit (HOSPITAL_BASED_OUTPATIENT_CLINIC_OR_DEPARTMENT_OTHER): Payer: Medicare Other

## 2014-08-09 ENCOUNTER — Ambulatory Visit (HOSPITAL_BASED_OUTPATIENT_CLINIC_OR_DEPARTMENT_OTHER): Payer: Medicare Other | Admitting: Pharmacist

## 2014-08-09 DIAGNOSIS — I482 Chronic atrial fibrillation, unspecified: Secondary | ICD-10-CM

## 2014-08-09 DIAGNOSIS — Z86718 Personal history of other venous thrombosis and embolism: Secondary | ICD-10-CM | POA: Diagnosis not present

## 2014-08-09 LAB — PROTIME-INR
INR: 4.6 — AB (ref 2.00–3.50)
Protime: 55.2 Seconds — ABNORMAL HIGH (ref 10.6–13.4)

## 2014-08-09 LAB — POCT INR: INR: 4.6

## 2014-08-09 NOTE — Progress Notes (Signed)
INR = 4.6   Goal 2-3 INR above goal range. Only medication change is hydrocodone cough syrup and clindamycin being used for URTI. Clindamycin should not interact with Coumadin. Patient states she missed 2 or 3 days of Coumadin last week while unable to swallow prior to seeing MD for URTI. She has been eating only very minimally while ill, this is the likely cause of her elevated INR. She states she still has no appetite. She will eat something green today (broccoli or spinach) since her INR is elevated, she knows not to eat excessive amounts of this. She will not take Coumadin today, then will decrease Coumadin to 5mg  daily except 2.5 mg on Saturday and Tuesday.  Recheck PT/INR on 08/17/14 at 1:30 pm for lab, and 1:45pm for Coumadin clinic.  Theone Murdoch, PharmD

## 2014-08-15 ENCOUNTER — Encounter: Payer: Self-pay | Admitting: Family Medicine

## 2014-08-15 ENCOUNTER — Ambulatory Visit (INDEPENDENT_AMBULATORY_CARE_PROVIDER_SITE_OTHER): Payer: Medicare Other | Admitting: Family Medicine

## 2014-08-15 VITALS — BP 130/70 | HR 87 | Temp 98.3°F | Wt 233.0 lb

## 2014-08-15 DIAGNOSIS — J029 Acute pharyngitis, unspecified: Secondary | ICD-10-CM

## 2014-08-15 MED ORDER — HYDROCODONE-ACETAMINOPHEN 7.5-500 MG/15ML PO SOLN: 15.0000 mL | Freq: Four times a day (QID) | ORAL | Status: DC | PRN

## 2014-08-15 NOTE — Progress Notes (Signed)
Subjective:    Patient ID: [name], female    DOB: 23-Apr-1945, 69 y.o.   MRN: [id number]  HPI Patient seen with sore throat. She develops severe sore throat background early part of May and on 5-13 went to emergency [facility] in [place].   Rapid strep negative. Throat culture apparently done but she has not been contacted with results. She was placed on clindamycin liquid and hydrocodone solution and noticed prompt improvement. She is doing better until this past Monday when she developed recurrence of moderately severe sore throat, though not as intense as recent ER visit. Minimal cough. No fever.  Past Medical History  Diagnosis Date  . DYSLIPIDEMIA 02/27/2009  . DEPRESSION 06/13/2008  . ALLERGIC RHINITIS 08/01/2009  . ABDOMINAL PAIN RIGHT UPPER QUADRANT 11/08/2008  . CARDIOVASCULAR FUNCTION STUDY, ABNORMAL 04/04/2009  . NON-HODGKIN'S LYMPHOMA, HX OF dx  2005--  chemoradiation completed 2006--  no recurrence    onocologist- dr odogwu--   . OSTEOARTHRITIS, MODERATE 04/07/2010  . CAD, NATIVE VESSEL 05/30/2009    cardiologist- dr Aundra Dubin- visit 04-13-2010 in epic-- denies cardiac symptoms  . DYSPNEA 02/27/2009  . Retroperitoneal fibrosis   . Frequency of urination   . History of Lyme disease   . History of Bell's palsy   . DDD (degenerative disc disease), lumbar   . GERD (gastroesophageal reflux disease)   . Anxiety   . 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 History of DVT of lower extremity 2006-- CHRONIC COUMADIN THERAPY  . Anticoagulated on Coumadin    Past Surgical History  Procedure Laterality Date  . Multiple cysto/ left ureteral stent exchanges  LAST ONE 10-28-10  . Total knee arthroplasty  OCT 2010    RIGHT  . Total knee arthroplasty  JAN 2010    LEFT  . Knee arthroscopy  2005    RIGHT  . Breast mass excision      BENIGN-- RIGHT BREAST  . Retroperitoneal bx  2007  . Exploratory laparotomy  2005   LYMPHOMA  . Tonsillectomy  CHILD  . Cardiovascular stress test  04-04-2009-- PER PT ASYMPTOMATIC-- MEDICAL MANAGEMENT    STUDY WAS EQUIVOCAL/ EF 49%/ GLOBAL HYPOKINESIS/ APPEARS TO BE A MILD ANTERIOR PERFUSION DEFECT COULD REPRESENT ISCHEMIA BUT DUE TO  ACTIVITY WORSE IN STRESS THAN AT REST POSS. THE DEFECT WAS ARTIFACTUAL  . Transthoracic echocardiogram  [phone]    MILD SYSTOLIC DYSFUNCTION/ EF 06-26%/ MODERATE DIASTOLIC DYSFUCTION/ MILD MR/ MILD BIATRIAL ENLARGEMENT  . Ct angiogram  FEB 2011    CALCIUM SCORE 161/ POSS. MODERATE MID LAD STENOSIS  . Cystoscopy w/ ureteral stent removal  04/28/2011    Procedure: CYSTOSCOPY WITH STENT REMOVAL;  Surgeon: Fredricka Bonine, MD;  Location: [facility];  Service: Urology;;  . Cystoscopy w/ retrogrades  04/28/2011    Procedure: CYSTOSCOPY WITH RETROGRADE PYELOGRAM;  Surgeon: Fredricka Bonine, MD;  Location: [facility];  Service: Urology;  Laterality: Left;    reports that she quit smoking about 6 years ago. Her smoking use included Cigarettes. She has a 104 pack-year smoking history. She has never used smokeless tobacco. She reports that she drinks alcohol. She reports that she does not use illicit drugs. family history includes Arthritis in her mother; Diabetes in her sister; Heart attack in her mother and sister; Heart disease in her mother; Hypertension in her sister; Stroke in her sister. Allergies  Allergen Reactions  . Chlorhexidine Base Itching    CHG WIPES  .  Penicillins Hives and Rash      Review of Systems  Constitutional: Negative for fever and chills.  HENT: Positive for sore throat. Negative for congestion, trouble swallowing and voice change.   Respiratory: Positive for cough.        Objective:   Physical Exam  Constitutional: She appears well-developed and well-nourished.  HENT:  Right Ear: External ear normal.  Left Ear: External ear normal.  Minimal posterior pharynx erythema.  No exudate. No thrush. No masses.  Neck: Neck supple.  Cardiovascular: Normal rate and regular rhythm.   Pulmonary/Chest: Effort normal and breath sounds normal. No respiratory distress. She has no wheezes. She has no rales.  Lymphadenopathy:    She has no cervical adenopathy.          Assessment & Plan:  Sore throat. Recent rapid strep testing negative. Exam unremarkable other than mild erythema. No exudate. Refill hydrocodone liquid to use for severe symptoms but use sparingly. Follow-up for fever or persistent symptoms

## 2014-08-15 NOTE — Patient Instructions (Signed)

## 2014-08-15 NOTE — Progress Notes (Signed)
Pre visit review using our clinic review tool, if applicable. No additional management support is needed unless otherwise documented below in the visit note. 

## 2014-08-16 ENCOUNTER — Telehealth: Payer: Self-pay | Admitting: Family Medicine

## 2014-08-16 NOTE — Telephone Encounter (Signed)
Pt aware that Rx is ready for pick up 

## 2014-08-16 NOTE — Telephone Encounter (Signed)
Phar has hard copy of lortab 7.5/500. That strength is no longer made. lortab 7.5/325 mg is made. Please call pharm back

## 2014-08-16 NOTE — Telephone Encounter (Signed)
OK to change to 7.5/325 mg strength.

## 2014-08-17 ENCOUNTER — Ambulatory Visit (HOSPITAL_BASED_OUTPATIENT_CLINIC_OR_DEPARTMENT_OTHER): Payer: Medicare Other | Admitting: Pharmacist

## 2014-08-17 ENCOUNTER — Other Ambulatory Visit (HOSPITAL_BASED_OUTPATIENT_CLINIC_OR_DEPARTMENT_OTHER): Payer: Medicare Other

## 2014-08-17 DIAGNOSIS — I82402 Acute embolism and thrombosis of unspecified deep veins of left lower extremity: Secondary | ICD-10-CM

## 2014-08-17 DIAGNOSIS — Z86718 Personal history of other venous thrombosis and embolism: Secondary | ICD-10-CM | POA: Diagnosis not present

## 2014-08-17 DIAGNOSIS — I482 Chronic atrial fibrillation, unspecified: Secondary | ICD-10-CM

## 2014-08-17 LAB — PROTIME-INR
INR: 2 (ref 2.00–3.50)
Protime: 24 Seconds — ABNORMAL HIGH (ref 10.6–13.4)

## 2014-08-17 LAB — POCT INR: INR: 2

## 2014-08-17 NOTE — Patient Instructions (Signed)
Slightly increase coumadin to 5mg  daily except 2.5mg  on Wed. Recheck INR in 2 weeks on 08/30/14; Lab at 2:30pm and coumadin clinic at 2:45pm.

## 2014-08-17 NOTE — Progress Notes (Signed)
INR within goal today. Pt took coumadin as instructed at last visit. She may have missed her coumadin dose on 5/22. She couldn't remember if she took it or not and didn't want to take 2 doses in the same day. No unusual bruising. No bleeding noted. No s/s of clotting noted. Appetite has returned to normal. Since appetite has returned and pt is eating again, will slightly increase coumadin to 5mg  daily except 2.5mg  on Wed. Recheck INR in 2 weeks on 08/30/14; Lab at 2:30pm and coumadin clinic at 2:45pm.

## 2014-08-28 ENCOUNTER — Telehealth: Payer: Self-pay

## 2014-08-28 NOTE — Telephone Encounter (Signed)
Spoke with pt.  She refuses to have a mammogram anymore.  She states that the lymphoma will take her before anything else.

## 2014-08-30 ENCOUNTER — Other Ambulatory Visit (HOSPITAL_BASED_OUTPATIENT_CLINIC_OR_DEPARTMENT_OTHER): Payer: Medicare Other

## 2014-08-30 ENCOUNTER — Ambulatory Visit (HOSPITAL_BASED_OUTPATIENT_CLINIC_OR_DEPARTMENT_OTHER): Payer: Medicare Other

## 2014-08-30 DIAGNOSIS — Z86718 Personal history of other venous thrombosis and embolism: Secondary | ICD-10-CM

## 2014-08-30 DIAGNOSIS — I82409 Acute embolism and thrombosis of unspecified deep veins of unspecified lower extremity: Secondary | ICD-10-CM

## 2014-08-30 DIAGNOSIS — I482 Chronic atrial fibrillation, unspecified: Secondary | ICD-10-CM

## 2014-08-30 LAB — PROTIME-INR
INR: 3.3 (ref 2.00–3.50)
Protime: 39.6 Seconds — ABNORMAL HIGH (ref 10.6–13.4)

## 2014-08-30 LAB — POCT INR: INR: 3.3

## 2014-08-30 MED ORDER — WARFARIN SODIUM 2.5 MG PO TABS: ORAL_TABLET | ORAL | Status: DC

## 2014-08-30 NOTE — Progress Notes (Signed)
[name]'s INR is 3.3 today which is slightly above her goal range of 2-3. She has been taking 5 mg daily except 2.5 mg on Wednesday.  She states she may have missed her dose on 6/5; she couldn't remember and did not want to accidentally take two doses. The only medication change she could recall is that she now takes 100 mg of Co-Q 10. No dietary changes reported.  No bleeding and/or unusual bruising. She requested an additional prescription for 2.5 mg tablets as she does not like splitting the tablets, so I have sent this prescription to her pharmacy.  Given her INR has significantly increased after a slight dose increase last visit, we will slightly decrease her weekly dose in attempts to get her INR within her goal range  Plan: -Slightly decrease Coumadin to 5 mg daily except 2.5 mg on Wed/Fri -Return for INR check 6/23: lab at 2 pm, Coumadin Clinic at 2:15 pm

## 2014-09-13 ENCOUNTER — Ambulatory Visit: Payer: Medicare Other

## 2014-09-13 ENCOUNTER — Ambulatory Visit (INDEPENDENT_AMBULATORY_CARE_PROVIDER_SITE_OTHER): Payer: Medicare Other | Admitting: Cardiology

## 2014-09-13 ENCOUNTER — Encounter: Payer: Self-pay | Admitting: Cardiology

## 2014-09-13 ENCOUNTER — Other Ambulatory Visit: Payer: Medicare Other

## 2014-09-13 VITALS — BP 126/72 | HR 81 | Ht 66.5 in | Wt 234.0 lb

## 2014-09-13 DIAGNOSIS — I2510 Atherosclerotic heart disease of native coronary artery without angina pectoris: Secondary | ICD-10-CM | POA: Diagnosis not present

## 2014-09-13 DIAGNOSIS — E785 Hyperlipidemia, unspecified: Secondary | ICD-10-CM | POA: Diagnosis not present

## 2014-09-13 DIAGNOSIS — I5032 Chronic diastolic (congestive) heart failure: Secondary | ICD-10-CM | POA: Diagnosis not present

## 2014-09-13 DIAGNOSIS — I4891 Unspecified atrial fibrillation: Secondary | ICD-10-CM

## 2014-09-13 MED ORDER — AMIODARONE HCL 200 MG PO TABS: ORAL_TABLET | ORAL | Status: DC

## 2014-09-13 NOTE — Patient Instructions (Addendum)
Medication Instructions:  Stop Multaq. After you have been off Multaq for 3 days (Sunday morning June 26,2016) start amiodarone.  Ater you have been off Multaq for 3 days, start  amiodarone 200mg  two times a day for 2 weeks, then decrease amiodarone to 200mg  daily.  Labwork: TSH/CBCd/Liver profile in 1 month.  Testing/Procedures: Your physician has recommended that you have a pulmonary function test. Pulmonary Function Tests are a group of tests that measure how well air moves in and out of your lungs.    Follow-Up:  Your physician recommends that you schedule a follow-up appointment in: 2 weeks with the nurse for an EKG. If you are still in atrial fibrillation in 2 weeks Dr Aundra Dubin will schedule you for a cardioversion.  Please have your PT/INR checked weekly since you may have a cardioversion.  PLEASE LET THEM KNOW YOU HAVE BEEN STARTED ON AMIODARONE.   Your physician recommends that you schedule a follow-up appointment in: 3 months with Dr Aundra Dubin.

## 2014-09-14 ENCOUNTER — Ambulatory Visit (HOSPITAL_BASED_OUTPATIENT_CLINIC_OR_DEPARTMENT_OTHER): Payer: Medicare Other | Admitting: Pharmacist

## 2014-09-14 ENCOUNTER — Telehealth: Payer: Self-pay

## 2014-09-14 ENCOUNTER — Other Ambulatory Visit: Payer: Self-pay

## 2014-09-14 ENCOUNTER — Telehealth: Payer: Self-pay | Admitting: *Deleted

## 2014-09-14 ENCOUNTER — Other Ambulatory Visit (HOSPITAL_BASED_OUTPATIENT_CLINIC_OR_DEPARTMENT_OTHER): Payer: Medicare Other

## 2014-09-14 DIAGNOSIS — Z86718 Personal history of other venous thrombosis and embolism: Secondary | ICD-10-CM

## 2014-09-14 DIAGNOSIS — I482 Chronic atrial fibrillation, unspecified: Secondary | ICD-10-CM

## 2014-09-14 DIAGNOSIS — I82409 Acute embolism and thrombosis of unspecified deep veins of unspecified lower extremity: Secondary | ICD-10-CM

## 2014-09-14 LAB — PROTIME-INR
INR: 1.9 — ABNORMAL LOW (ref 2.00–3.50)
Protime: 22.8 Seconds — ABNORMAL HIGH (ref 10.6–13.4)

## 2014-09-14 LAB — POCT INR: INR: 1.9

## 2014-09-14 MED ORDER — PITAVASTATIN CALCIUM 2 MG PO TABS: ORAL_TABLET | ORAL | Status: DC

## 2014-09-14 NOTE — Patient Instructions (Addendum)
INR at goal  starting amiodarone so decrease dose to 5 mg daily except for 2.5 mg on MWF Recheck INR in 1 week on 09/21/14 at 2pm for lab and 2:15pm for coumadin clinic

## 2014-09-14 NOTE — Progress Notes (Signed)
Patient ID: [name], female   DOB: 1946-02-02, 69 y.o.   MRN: [id number] PCP: Dr. Elease Hashimoto  69 yo with history of retroperitoneal fibrosis and non-Hodgkin's lymphoma as well as prior DVT presented initially for evaluation of an abnormal myoview.  A Lexiscan myoview was done in 1/11. This was an equivocal study due to subdiaphragmatic nuclear uptake. EF was calculated to be 49% with global hypokinesis. Given the equivocal myoview, I had her do a coronary CT angiogram. This showed a moderate calcium score of 161 (putting her at a high risk percentile for her age) and a possible moderate mid LAD stenosis (though some degradation of images by respiratory artifact). Echo was done to reassess LV systolic function (reported as mildly decreased on myoview). This confirmed mild LV systolic dysfunction, EF 01-77% with moderate diastolic dysfunction. We discussed catheterization, but patient strongly preferred medical treatment.   She has been diagnosed with paroxysmal atrial fibrillation.  She was started on warfarin and diltiazem CD.  Last echo in 8/15 showed EF 50-55%, moderate LV hypertrophy, probably mild mitral stenosis, normal RV size and systolic function.    She has been on dronedarone to try to maintain NSR, but she is in atrial fibrillation today.  She does not feel palpitations and did not know that she was in atrial fibrillation.  She continues to take Lasix.  She can walk 1 block without dyspnea, no problems walking around her house.  She uses a cane for stability.  No chest pain.  No orthopnea/PND.  Weight is down 5 lbs.  Chronic cough resolved when lisinopril was replaced with losartan.   Labs (12/10): HCT 34.5, K 4.3, creatinine 1.2, LDL 101, HDL 46  Labs (7/11): LFTs normal, LDL 69, HDL 47, BNP 21, K 4.3, creatinine 1.2  Labs (1/12): K 5.2, creatinine 1.0, LFTs normal, LDL 59, HDL 46  Labs (6/13): LDL 54, HDL 52, K 4.7, creatinine 1.1 Labs (9/14): LDL 79, HDL 56 Labs (4/15): TSH  normal Labs (11/15): K 5.2, creatinine 1.2, LDL 69, HDL 63 Labs (3/16): BNP 168, K 4, creatinine 1.08 Labs (4/16): K 4.1, creatinine 1.12  ECG: atrial fibrillation at 81  Past Medical History:   1. Anxiety Disorder  2. Arthritis  3. Depression  4. Retroperitoneal fibrosis with non-Hodgkin's lymphoma. This was treated with chemotherapy in 2006 and is in remission.  5. Adenomatous Colon Polyps  6. Diverticulosis  7. DVT left leg/thigh: in both 2005 and 2006, associated with lymphoma. She is on chronic coumadin.  8. Obesity  9. Left ureteral stent (ureter involved by retroperitoneal fibrosis).  10. CAD: Lexiscan myoview (1/11): EF 49% with global hypokinesis. There appeared to be a mild anterior perfusion defect that could represent ischemia. However, there was significant subdiaphragmatic nuclear activity that was worse in stress than rest so it is possible that the defect was artifactual. This study was equivocal. Coronary CT angiogram was done to follow this up in 2/11. This showed calcium score of 161 (86th percentile for her age and race) and a possible moderate mid LAD stenosis.  11. Mild systolic dysfunction: EF 93% on myoview. Echo (2/11): EF 45-50%, moderate diastolic dysfunction, mild MR, mild biatrial enlargement, PASP 34 mmHg. It is possible that this could be chemotherapy-related.  Echo (11/14): EF 50-55%, grade II diastolic dysfunction, ?moderate mitral stenosis, no MR. Echo (8/15): EF 50-55%, moderate LVH, probably mild mitral stenosis with mean gradient 2, normal PA pressure, normal RV.  12. Hyperlipidemia: myalgias with Crestor.  13. Atrial fibrillation: Paroxysmal.  14. Cough with ACEI  Family History:  Family History of Arthritis mother  Family History Diabetes 1st degree relative deceased sister  Family History Hypertension deceased sister  Family History of Stroke F 1st degree relative <60 deceased sister  Family History of Cardiovascular disorder mother  Sister had MI  and CVA at 44  Mother with CABG in her 54s   Social History:  Retired  Married  Quit smoking 5/10  Alcohol use-yes, rarely  Regular exercise-no   Review of Systems  All systems reviewed and negative except as per HPI.   Current Outpatient Prescriptions  Medication Sig Dispense Refill  . ALPRAZolam (XANAX) 1 MG tablet Take 1 tablet (1 mg total) by mouth 3 (three) times daily. 90 tablet 5  . amiodarone (PACERONE) 200 MG tablet 1 tablet by mouth two times a day for 2 weeks, then decrease to 1 tablet by mouth daily 45 tablet 3  . b complex vitamins capsule Take 2 capsules by mouth daily.     . beta carotene w/minerals (OCUVITE) tablet Take 1 tablet by mouth daily.    Marland Kitchen buPROPion (WELLBUTRIN XL) 300 MG 24 hr tablet take 1 tablet by mouth once daily 90 tablet 1  . Cholecalciferol (VITAMIN D) 2000 UNITS CAPS Take 2,000 Int'l Units by mouth.    . co-enzyme Q-10 50 MG capsule Take 100 mg by mouth daily.    . Diclofenac Sodium (PENNSAID) 2 % SOLN Place 1 application onto the skin 3 (three) times daily as needed. 112 g 3  . diltiazem (CARDIZEM CD) 180 MG 24 hr capsule Take 1 capsule (180 mg total) by mouth 2 (two) times daily. 60 capsule 6  . DULoxetine (CYMBALTA) 60 MG capsule Take 1 capsule (60 mg total) by mouth daily. 30 capsule 2  . fluticasone (FLONASE) 50 MCG/ACT nasal spray instill 2 sprays into each nostril once daily 16 g 6  . furosemide (LASIX) 20 MG tablet Take 1 tablet (20 mg total) by mouth daily.    Marland Kitchen HYDROcodone-acetaminophen (HYCET) 7.5-325 mg/15 ml solution   0  . loratadine (CLARITIN) 10 MG tablet Take 10 mg by mouth daily.     Marland Kitchen losartan (COZAAR) 25 MG tablet Take 1 tablet (25 mg total) by mouth daily. 90 tablet 3  . Lysine 500 MG TABS Take 500 mg by mouth daily.    Marland Kitchen NATURE-THROID 113.75 MG TABS Take 1 tablet by mouth daily.  0  . NITROSTAT 0.4 MG SL tablet place 1 tablet under the tongue every 5 minutes for UP TO 3 doses if needed (Patient not taking: Reported on 08/17/2014)  25 tablet 12  . omeprazole (PRILOSEC) 20 MG capsule Take 20 mg by mouth every morning.    Marland Kitchen oxyCODONE (OXY IR/ROXICODONE) 5 MG immediate release tablet Take 1 tablet (5 mg total) by mouth every 6 (six) hours as needed for severe pain. (Patient not taking: Reported on 08/17/2014) 30 tablet 0  . Pitavastatin Calcium (LIVALO) 2 MG TABS take 1 tablet by mouth once daily 90 tablet 2  . Pregnenolone Micronized POWD by Does not apply route.    . warfarin (COUMADIN) 2.5 MG tablet Take as directed by Coumadin Clinic 30 tablet 2  . warfarin (COUMADIN) 5 MG tablet Take 5 mg daily except 7.5 mg on MWF or as directed 90 tablet 3   No current facility-administered medications for this visit.   BP 126/72 mmHg  Pulse 81  Ht 5' 6.5" (1.689 m)  Wt 234 lb (106.142 kg)  BMI 37.21 kg/m2 General: NAD, obese.  Neck: No JVD, no thyromegaly or thyroid nodule.  Lungs: Clear to auscultation bilaterally with normal respiratory effort. CV: Nondisplaced PMI.  Heart regular S1/S2, no S3/S4, no murmur.  Bilateral varicosities with trace ankle edema.  No carotid bruit.  Normal pedal pulses.  Abdomen: Soft, nontender, no hepatosplenomegaly, no distention.  Neurologic: Alert and oriented x 3.  Psych: Normal affect. Extremities: No clubbing or cyanosis.   Assessment/Plan: 1. Atrial fibrillation: She is back in atrial fibrillation today despite dronedarone.  She says that this medication is very expensive.    - Dronedarone does not appear to be working.  I would like to give her one more chance to maintain NSR. She is not a good candidate for class Ic agents given CAD.   Stop dronedarone and 3 days later can start amiodarone 200 mg bid x 2 weeks then 200 mg daily.  Check INR every week.  Check ECG in 2 wks => if still in atrial fibrillation, will arrange for DCCV.   - With amiodarone use, will check baseline PFTs.  Will get LFTs/TSH in 1 month.  - Continue warfarin (has preferred this to NOAC). CBC today.  - Continue current  diltiazem CD.  2. CAD: She is on warfarin so not on ASA.  No chest pain. Continue statin.  3. Hyperlipidemia: Good lipids in 11/15.  Goal LDL < 70 given known CAD from coronary CTA in past.  4. Mitral stenosis: Reported on echo from outside lab.  We reassessed this by echo here in 8/15, suspect no more than mild mitral stenosis.  5. Chronic cough: Resolved off ACEI.  6. Chronic diastolic CHF: Triggered by atrial fibrillation in the past.  Currently seems to be doing well on Lasix 20 mg daily.    Loralie Champagne 09/14/2014

## 2014-09-14 NOTE — Progress Notes (Signed)
INR right at goal today at 1.9 (goal 2-3) Pt is currently in A. Fib. She has seen Dr. Aundra Dubin on 6/23 Switching from Dronedarone and starting amiodarone as it appears she is not getting benefit from Dronedarone. Ms. Marcella is asymptomatic  No other diet or medication changes No missed or extra doses The amiodarone will be started on Sunday 6/26 (she stopped the dronedarone on 6/23) This can increase the effects of coumadin. The effects can be seen after a few weeks so anticipate dose reductions needed Will decrease by 10% starting next week. And recheck INR in 1 week No unusual bleeding or bruising  Plan: decrease dose to 5 mg daily except for 2.5 mg on MWF Recheck INR in 1 week on 09/21/14 at 2pm for lab and 2:15pm for coumadin clinic

## 2014-09-14 NOTE — Telephone Encounter (Signed)
Patient of Dr Aundra Dubin has lost her livalo which she just picked up from the pharmacy and would like a printed rx with a copay card to take to another pharmacy. She would like to pick this up today. I am routing to you since Dr Angelena Form is dod. Please advise. Thanks, MI

## 2014-09-14 NOTE — Telephone Encounter (Signed)
Prescription signed by Dr. Angelena Form

## 2014-09-14 NOTE — Telephone Encounter (Signed)
Pt stated she was getting her livallo. Forwarded to [place] in coumadin clinic.

## 2014-09-21 ENCOUNTER — Other Ambulatory Visit (HOSPITAL_BASED_OUTPATIENT_CLINIC_OR_DEPARTMENT_OTHER): Payer: Medicare Other

## 2014-09-21 ENCOUNTER — Ambulatory Visit (HOSPITAL_BASED_OUTPATIENT_CLINIC_OR_DEPARTMENT_OTHER): Payer: Medicare Other

## 2014-09-21 DIAGNOSIS — Z86718 Personal history of other venous thrombosis and embolism: Secondary | ICD-10-CM

## 2014-09-21 DIAGNOSIS — I482 Chronic atrial fibrillation, unspecified: Secondary | ICD-10-CM

## 2014-09-21 DIAGNOSIS — I82409 Acute embolism and thrombosis of unspecified deep veins of unspecified lower extremity: Secondary | ICD-10-CM

## 2014-09-21 LAB — POCT INR: INR: 1.8

## 2014-09-21 LAB — PROTIME-INR
INR: 1.8 — AB (ref 2.00–3.50)
Protime: 21.6 Seconds — ABNORMAL HIGH (ref 10.6–13.4)

## 2014-09-21 NOTE — Progress Notes (Signed)
[name]'s INR is 1.8 today which is slightly below her goal range of 2-3. She has been taking 5 mg daily except 2.5 mg on M/W/F. We empirically reduced her dose at her last visit slightly as she was starting amiodarone which is known to potentially increase the INR. We have not seen this interaction yet, though with amiodarone's long half life, we may not see this for a while. We will continue to monitor for this interaction and will adjust her warfarin dose as necessary. She may be going for cardioversion soon, and we are following her INR weekly per her cardiologist's request. She reports no extra and/or missed doses.  No bleeding and/or unusual bruising reported.  No medication and/or dietary changes.  Plan: Slightly increase Coumadin to 5 mg daily except 2.5 mg on Mondays and Wednesdays. Return to Coumadin Clinic 7/8: lab at 2:30 pm and Coumadin Clinic at 2:45 pm

## 2014-09-22 ENCOUNTER — Other Ambulatory Visit: Payer: Self-pay | Admitting: Family Medicine

## 2014-09-28 ENCOUNTER — Ambulatory Visit (HOSPITAL_BASED_OUTPATIENT_CLINIC_OR_DEPARTMENT_OTHER): Payer: Medicare Other | Admitting: Pharmacist

## 2014-09-28 ENCOUNTER — Other Ambulatory Visit (HOSPITAL_BASED_OUTPATIENT_CLINIC_OR_DEPARTMENT_OTHER): Payer: Medicare Other

## 2014-09-28 DIAGNOSIS — I482 Chronic atrial fibrillation, unspecified: Secondary | ICD-10-CM

## 2014-09-28 DIAGNOSIS — Z86718 Personal history of other venous thrombosis and embolism: Secondary | ICD-10-CM

## 2014-09-28 LAB — PROTIME-INR
INR: 1.3 — AB (ref 2.00–3.50)
Protime: 15.6 Seconds — ABNORMAL HIGH (ref 10.6–13.4)

## 2014-09-28 LAB — POCT INR: INR: 1.3

## 2014-09-28 NOTE — Progress Notes (Signed)
INR = 1.3   Goal 2-3 No missed doses of Coumadin. Mrs. Saine started amiodarone a few weeks ago, this would be anticipated to interact with Coumadin to raise INR. She has had some severe difficulties with her family this week and has not been eating very much. She is also sleeping over 18 hours per day since the "blowup" at home. Her PCP manages her antidepressants and she has been taking them (Cymbalta) as prescribed. She missed an appt for an ECHO yesterday because she did not want to get out of bed. She takes Coumadin for atrial fibrillation. She has an appt for an ECHO scheduled next Wednesday and we will schedule the INR check for the same day so that she doesn't have to make an additional trip. She will take Coumadin 7.5 mg today; then increase Coumadin to 5mg  daily. Recheck INR next week on 10/03/14; Lab at 1:15 pm and Coumadin clinic at 1:30 pm.  Theone Murdoch, PharmD

## 2014-10-01 ENCOUNTER — Telehealth: Payer: Self-pay | Admitting: Pharmacist

## 2014-10-01 NOTE — Telephone Encounter (Signed)
Pt has an appt on 10/04/14 at 1pm across the street at [facility] for PFT's. Move lab and coumadin clinic appt to 10/04/14; lab at 2:30pm and Coumadin clinic at 2:45pm/

## 2014-10-03 ENCOUNTER — Encounter: Payer: Self-pay | Admitting: Nurse Practitioner

## 2014-10-03 ENCOUNTER — Ambulatory Visit (INDEPENDENT_AMBULATORY_CARE_PROVIDER_SITE_OTHER): Payer: Medicare Other

## 2014-10-03 ENCOUNTER — Ambulatory Visit: Payer: Medicare Other

## 2014-10-03 ENCOUNTER — Other Ambulatory Visit: Payer: Medicare Other

## 2014-10-03 VITALS — BP 116/80 | HR 75 | Wt 226.0 lb

## 2014-10-03 DIAGNOSIS — I4891 Unspecified atrial fibrillation: Secondary | ICD-10-CM | POA: Diagnosis not present

## 2014-10-03 NOTE — Progress Notes (Signed)
Here for nurse visit EKG as requested by Dr. Loralie Champagne.  Has been under a lot of stress.  Denies shortness of breath, or chest pain  Is taking Amiodarone 200 mg daily.    Has not been sure how she is taking her medications with all her stress.    I recommended her to buy an inexpensive pill dispenser with days and times to assist her with compliance  Said the coumadin clinic said that her INR is still to low and they have been adjusting her coumadin  As of today she is on 5 mg for Six days, 2.5 mg for one day. Level to be drawn 7/14  Also mentioned that her son is "on her" about getting tested for diabetes.  EKG complete- (AFIB) Today's and EKG from 09/13/14 shown to L. Gearhart APP NP  Instructed patient that next step would be to get her coumadin to a therapeutic level for four times and then they would probably set her up for a DCCV

## 2014-10-03 NOTE — Patient Instructions (Signed)
Continue with medications as prescribed  Continue with appointments with coumadin clinic  Call if any chest pain, shortness of breath, rapid heart rate, dizziness , weakness or if any problems questions or concerns.  Will hear from Dr. Claris Gladden office for plans for DCCV

## 2014-10-03 NOTE — Progress Notes (Signed)
Weekly INRs, when she has had 4 therapeutic INRs in a row, needs to be arranged for DCCV.

## 2014-10-04 ENCOUNTER — Ambulatory Visit (INDEPENDENT_AMBULATORY_CARE_PROVIDER_SITE_OTHER): Payer: Medicare Other | Admitting: Internal Medicine

## 2014-10-04 ENCOUNTER — Ambulatory Visit (HOSPITAL_BASED_OUTPATIENT_CLINIC_OR_DEPARTMENT_OTHER): Payer: Medicare Other | Admitting: Pharmacist

## 2014-10-04 ENCOUNTER — Other Ambulatory Visit (HOSPITAL_BASED_OUTPATIENT_CLINIC_OR_DEPARTMENT_OTHER): Payer: Medicare Other

## 2014-10-04 DIAGNOSIS — I482 Chronic atrial fibrillation, unspecified: Secondary | ICD-10-CM

## 2014-10-04 DIAGNOSIS — Z86718 Personal history of other venous thrombosis and embolism: Secondary | ICD-10-CM

## 2014-10-04 DIAGNOSIS — I4891 Unspecified atrial fibrillation: Secondary | ICD-10-CM

## 2014-10-04 LAB — PULMONARY FUNCTION TEST
DL/VA % pred: 70 %
DL/VA: 3.51 ml/min/mmHg/L
DLCO unc % pred: 75 %
DLCO unc: 19.74 ml/min/mmHg
FEF 25-75 POST: 1.64 L/s
FEF 25-75 Pre: 1.23 L/sec
FEF2575-%Change-Post: 32 %
FEF2575-%Pred-Post: 83 %
FEF2575-%Pred-Pre: 62 %
FEV1-%Change-Post: 6 %
FEV1-%Pred-Post: 92 %
FEV1-%Pred-Pre: 87 %
FEV1-PRE: 2.09 L
FEV1-Post: 2.22 L
FEV1FVC-%Change-Post: 5 %
FEV1FVC-%Pred-Pre: 92 %
FEV6-%Change-Post: 0 %
FEV6-%Pred-Post: 97 %
FEV6-%Pred-Pre: 96 %
FEV6-Post: 2.93 L
FEV6-Pre: 2.92 L
FEV6FVC-%Change-Post: 1 %
FEV6FVC-%Pred-Post: 103 %
FEV6FVC-%Pred-Pre: 102 %
FVC-%Change-Post: 1 %
FVC-%Pred-Post: 95 %
FVC-%Pred-Pre: 94 %
FVC-POST: 3.02 L
FVC-Pre: 2.98 L
POST FEV1/FVC RATIO: 74 %
PRE FEV1/FVC RATIO: 70 %
PRE FEV6/FVC RATIO: 98 %
Post FEV6/FVC ratio: 99 %

## 2014-10-04 LAB — POCT INR: INR: 3.7

## 2014-10-04 LAB — PROTIME-INR
INR: 3.7 — ABNORMAL HIGH (ref 2.00–3.50)
Protime: 44.4 Seconds — ABNORMAL HIGH (ref 10.6–13.4)

## 2014-10-04 NOTE — Progress Notes (Signed)
Pt was seen in clinic today INR=3.7 with 7.5 mg once last week when INR low and a slight increase to 5mg  daily No other changes per patient  Decrease Coumadin slightly to 2.5 mg on Tue and Thur with 5mg  other days. Recheck INR in 1 week on 10/10/14; Lab at 2:00 pm and Coumadin clinic at 2:15 pm.

## 2014-10-04 NOTE — Progress Notes (Signed)
PFT done today. 

## 2014-10-04 NOTE — Patient Instructions (Signed)
Decrease Coumadin slightly to 2.5 mg on Tue and Thur with 5mg  other days. Recheck INR in 1 week on 10/10/14; Lab at 2:00 pm and Coumadin clinic at 2:15 pm.

## 2014-10-10 ENCOUNTER — Ambulatory Visit (HOSPITAL_BASED_OUTPATIENT_CLINIC_OR_DEPARTMENT_OTHER): Payer: Medicare Other | Admitting: Pharmacist

## 2014-10-10 ENCOUNTER — Other Ambulatory Visit (HOSPITAL_BASED_OUTPATIENT_CLINIC_OR_DEPARTMENT_OTHER): Payer: Medicare Other

## 2014-10-10 DIAGNOSIS — I482 Chronic atrial fibrillation, unspecified: Secondary | ICD-10-CM

## 2014-10-10 DIAGNOSIS — Z86718 Personal history of other venous thrombosis and embolism: Secondary | ICD-10-CM

## 2014-10-10 DIAGNOSIS — I82409 Acute embolism and thrombosis of unspecified deep veins of unspecified lower extremity: Secondary | ICD-10-CM

## 2014-10-10 LAB — PROTIME-INR
INR: 1.6 — AB (ref 2.00–3.50)
Protime: 19.2 Seconds — ABNORMAL HIGH (ref 10.6–13.4)

## 2014-10-10 LAB — POCT INR: INR: 1.6

## 2014-10-10 NOTE — Patient Instructions (Signed)
INR below goal Increase back to 5 mg daily Recheck INR in 1 week on 10/19/14; Lab at 2:00 pm and Coumadin clinic at 2:15 pm.

## 2014-10-10 NOTE — Progress Notes (Signed)
INR below goal today at 1.6 (goal 2-3) Pt is doing well with no complaints She is feeling better and is back to normal diet and sleeping schedule No missed or extra doses No diet or medication changes No unusual bleeding or bruising Plan: Increase back to 5 mg daily Recheck INR in 1 week on 10/19/14; Lab at 2:00 pm and Coumadin clinic at 2:15 pm.

## 2014-10-11 ENCOUNTER — Other Ambulatory Visit (INDEPENDENT_AMBULATORY_CARE_PROVIDER_SITE_OTHER): Payer: Medicare Other | Admitting: *Deleted

## 2014-10-11 DIAGNOSIS — I4891 Unspecified atrial fibrillation: Secondary | ICD-10-CM

## 2014-10-11 LAB — CBC WITH DIFFERENTIAL/PLATELET
BASOS PCT: 1.1 % (ref 0.0–3.0)
Basophils Absolute: 0.1 10*3/uL (ref 0.0–0.1)
EOS PCT: 1.4 % (ref 0.0–5.0)
Eosinophils Absolute: 0.1 10*3/uL (ref 0.0–0.7)
HEMATOCRIT: 36.9 % (ref 36.0–46.0)
Hemoglobin: 12.4 g/dL (ref 12.0–15.0)
LYMPHS ABS: 3.5 10*3/uL (ref 0.7–4.0)
MCHC: 33.5 g/dL (ref 30.0–36.0)
MCV: 87.5 fl (ref 78.0–100.0)
MONO ABS: 0.6 10*3/uL (ref 0.1–1.0)
Monocytes Relative: 9.4 % (ref 3.0–12.0)
NEUTROS PCT: 30.4 % — AB (ref 43.0–77.0)
Neutro Abs: 1.8 10*3/uL (ref 1.4–7.7)
Platelets: 196 10*3/uL (ref 150.0–400.0)
RBC: 4.22 Mil/uL (ref 3.87–5.11)
RDW: 15.7 % — ABNORMAL HIGH (ref 11.5–15.5)
WBC: 6 10*3/uL (ref 4.0–10.5)

## 2014-10-11 LAB — TSH: TSH: 0.57 u[IU]/mL (ref 0.35–4.50)

## 2014-10-11 LAB — HEPATIC FUNCTION PANEL
ALBUMIN: 3.5 g/dL (ref 3.5–5.2)
ALT: 17 U/L (ref 0–35)
AST: 13 U/L (ref 0–37)
Alkaline Phosphatase: 80 U/L (ref 39–117)
BILIRUBIN DIRECT: 0.1 mg/dL (ref 0.0–0.3)
TOTAL PROTEIN: 6.1 g/dL (ref 6.0–8.3)
Total Bilirubin: 0.3 mg/dL (ref 0.2–1.2)

## 2014-10-11 NOTE — Addendum Note (Signed)
Addended by: Eulis Foster on: 10/11/2014 07:37 AM   Modules accepted: Orders

## 2014-10-12 ENCOUNTER — Other Ambulatory Visit: Payer: Medicare Other

## 2014-10-16 DIAGNOSIS — N3946 Mixed incontinence: Secondary | ICD-10-CM | POA: Diagnosis not present

## 2014-10-16 DIAGNOSIS — N261 Atrophy of kidney (terminal): Secondary | ICD-10-CM | POA: Diagnosis not present

## 2014-10-19 ENCOUNTER — Ambulatory Visit (HOSPITAL_BASED_OUTPATIENT_CLINIC_OR_DEPARTMENT_OTHER): Payer: Medicare Other | Admitting: Pharmacist

## 2014-10-19 ENCOUNTER — Other Ambulatory Visit (HOSPITAL_BASED_OUTPATIENT_CLINIC_OR_DEPARTMENT_OTHER): Payer: Medicare Other

## 2014-10-19 DIAGNOSIS — Z86718 Personal history of other venous thrombosis and embolism: Secondary | ICD-10-CM | POA: Diagnosis not present

## 2014-10-19 DIAGNOSIS — I482 Chronic atrial fibrillation, unspecified: Secondary | ICD-10-CM

## 2014-10-19 DIAGNOSIS — I82409 Acute embolism and thrombosis of unspecified deep veins of unspecified lower extremity: Secondary | ICD-10-CM

## 2014-10-19 LAB — PROTIME-INR
INR: 3.2 (ref 2.00–3.50)
Protime: 38.4 Seconds — ABNORMAL HIGH (ref 10.6–13.4)

## 2014-10-19 LAB — POCT INR: INR: 3.2

## 2014-10-19 NOTE — Patient Instructions (Signed)
INR right above goal Take 2.5 mg (half tablet) tonight only then continue 5 mg daily Recheck INR in 1 week on 10/26/14; Lab at 2:15 pm and Coumadin clinic at 2:30 pm.

## 2014-10-19 NOTE — Progress Notes (Signed)
INR right above goal today at 3.2 (goal is 2-3) [name] is doing well with no complaints INR now above goal slightly since dose increase last week No missed or extra doses No diet or medication changes No unusual bleeding or bruising Plan: Take 2.5 mg (half tablet) tonight only then continue 5 mg daily Recheck INR in 1 week on 10/26/14; Lab at 2:15 pm and Coumadin clinic at 2:30 pm.

## 2014-10-22 ENCOUNTER — Other Ambulatory Visit: Payer: Self-pay | Admitting: Family Medicine

## 2014-10-26 ENCOUNTER — Ambulatory Visit: Payer: Medicare Other

## 2014-10-26 ENCOUNTER — Other Ambulatory Visit: Payer: Medicare Other

## 2014-10-29 ENCOUNTER — Encounter: Payer: Self-pay | Admitting: Pharmacist

## 2014-10-29 ENCOUNTER — Other Ambulatory Visit: Payer: Medicare Other

## 2014-10-29 ENCOUNTER — Ambulatory Visit: Payer: Medicare Other

## 2014-10-29 NOTE — Progress Notes (Signed)
FTKA in coumadin clinic today.  Left VM to call and reschedule

## 2014-10-31 ENCOUNTER — Encounter: Payer: Self-pay | Admitting: Pharmacist

## 2014-10-31 NOTE — Progress Notes (Signed)
Pt called to r/s CC appmt for 11/02/14 Message sent to scheduler for 2:30/2:45 appmts

## 2014-11-02 ENCOUNTER — Ambulatory Visit (HOSPITAL_BASED_OUTPATIENT_CLINIC_OR_DEPARTMENT_OTHER): Payer: Medicare Other | Admitting: Pharmacist

## 2014-11-02 ENCOUNTER — Other Ambulatory Visit (HOSPITAL_BASED_OUTPATIENT_CLINIC_OR_DEPARTMENT_OTHER): Payer: Medicare Other

## 2014-11-02 DIAGNOSIS — I482 Chronic atrial fibrillation, unspecified: Secondary | ICD-10-CM

## 2014-11-02 DIAGNOSIS — Z86718 Personal history of other venous thrombosis and embolism: Secondary | ICD-10-CM | POA: Diagnosis not present

## 2014-11-02 DIAGNOSIS — I82409 Acute embolism and thrombosis of unspecified deep veins of unspecified lower extremity: Secondary | ICD-10-CM

## 2014-11-02 LAB — POCT INR: INR: 1.2

## 2014-11-02 LAB — PROTIME-INR
INR: 1.2 — AB (ref 2.00–3.50)
PROTIME: 14.4 s — AB (ref 10.6–13.4)

## 2014-11-02 NOTE — Progress Notes (Signed)
INR below goal today at 1.2 (goal 2-3) Unsure why level is now so subtherapeutic. Possible lab machine calibration error? INR has been jumping around lately however but patient states she has not missed any doses No unusual bleeding or bruising and now signs/symptoms of clots No medication or diet changes Will not make adjustment this week as drop in INR is suspicious. Will check next week Plans for this week: Take 7.5 mg (1 and a half tablet) tonight only then continue 5 mg daily Recheck INR in 1 week on 11/09/14; Lab at 2:30 pm and Coumadin clinic at 2:45 pm.

## 2014-11-02 NOTE — Patient Instructions (Signed)
INR below goal Take 7.5 mg (1 and a half tablet) tonight only then continue 5 mg daily Recheck INR in 1 week on 11/09/14; Lab at 2:30 pm and Coumadin clinic at 2:45 pm.

## 2014-11-09 ENCOUNTER — Other Ambulatory Visit (HOSPITAL_BASED_OUTPATIENT_CLINIC_OR_DEPARTMENT_OTHER): Payer: Medicare Other

## 2014-11-09 ENCOUNTER — Ambulatory Visit (HOSPITAL_BASED_OUTPATIENT_CLINIC_OR_DEPARTMENT_OTHER): Payer: Medicare Other | Admitting: Pharmacist

## 2014-11-09 DIAGNOSIS — I482 Chronic atrial fibrillation, unspecified: Secondary | ICD-10-CM

## 2014-11-09 DIAGNOSIS — Z86718 Personal history of other venous thrombosis and embolism: Secondary | ICD-10-CM | POA: Diagnosis not present

## 2014-11-09 LAB — PROTIME-INR
INR: 1.7 — AB (ref 2.00–3.50)
PROTIME: 20.4 s — AB (ref 10.6–13.4)

## 2014-11-09 LAB — POCT INR: INR: 1.7

## 2014-11-09 NOTE — Progress Notes (Signed)
INR = 1.7     Goal 2-3 INR below goal range despite dose increase. No dietary or medication changes; she continues amiodarone 200 mg daily. She states that she has not missed any doses but takes Coumadin at different times daily. Last night she took her Coumadin at 3am. Her cardiologist is planning to do DCCV when INR has been > 2 for 4 consecutive weeks. She has received the letter explaining that Coumadin clinic is closing. Since her INR is subtherapeutic and not currently stable we will see her in Coumadin clinic next week. Increase Coumadin  5 mg daily except 7.5 mg on Tuesday and Friday. Recheck INR in 1 week on 11/16/14; Lab at 2:30 pm and Coumadin clinic at 2:45 pm.  Theone Murdoch, PharmD

## 2014-11-15 ENCOUNTER — Other Ambulatory Visit: Payer: Self-pay

## 2014-11-15 DIAGNOSIS — I480 Paroxysmal atrial fibrillation: Secondary | ICD-10-CM

## 2014-11-15 DIAGNOSIS — I5032 Chronic diastolic (congestive) heart failure: Secondary | ICD-10-CM

## 2014-11-15 MED ORDER — FUROSEMIDE 20 MG PO TABS: 20.0000 mg | ORAL_TABLET | Freq: Every day | ORAL | Status: DC

## 2014-11-15 NOTE — Telephone Encounter (Signed)
Call [place], RN at 06/26/2014 12:48 PM     Status: Signed       Expand All Collapse All   Pt advised, verbalized understanding, BMET scheduled for 07/04/14.            Larey Dresser, MD at 06/26/2014 11:46 AM     Status: Signed       Expand All Collapse All   Go back to Lasix 20 mg daily with BMET in 1 week.Larey Dresser, MD at 09/14/2014 12:45 AM  furosemide (LASIX) 20 MG tablet Take 1 tablet (20 mg total) by mouth daily.

## 2014-11-16 ENCOUNTER — Ambulatory Visit (HOSPITAL_BASED_OUTPATIENT_CLINIC_OR_DEPARTMENT_OTHER): Payer: Medicare Other | Admitting: Pharmacist

## 2014-11-16 ENCOUNTER — Other Ambulatory Visit: Payer: Medicare Other

## 2014-11-16 DIAGNOSIS — Z86718 Personal history of other venous thrombosis and embolism: Secondary | ICD-10-CM | POA: Diagnosis not present

## 2014-11-16 DIAGNOSIS — I482 Chronic atrial fibrillation, unspecified: Secondary | ICD-10-CM

## 2014-11-16 LAB — PROTIME-INR
INR: 3.7 — ABNORMAL HIGH (ref 2.00–3.50)
PROTIME: 44.4 s — AB (ref 10.6–13.4)

## 2014-11-16 LAB — POCT INR: INR: 3.7

## 2014-11-16 NOTE — Patient Instructions (Signed)
Decrease coumadin to 5mg  daily except 7.5mg  on Tuesdays.  Recheck INR in 1 week on 11/23/14: Lab at 2:30pm and coumadin clinic at 2:45pm.

## 2014-11-16 NOTE — Progress Notes (Addendum)
INR slightly above goal today. INR increased significantly with a slight dose increase. Took coumadin as instructed. No missed or extra coumadin doses. No changes in diet or medications. No unusual bruising. No bleeding noted. No s/s of clotting. Pt is going to [place] in October and doesn't want any cardiac procedure before that time. She may have it after she returns. She is starting to feel that her body doesn't want the procedure either. Keeping this in mind, will slightly decrease coumadin dose. Pt would still like to recheck INR in 1 week. Decrease coumadin to 5mg  daily except 7.5mg  on Tuesdays.  Recheck INR in 1 week on 11/23/14: Lab at 2:30pm and coumadin clinic at 2:45pm.

## 2014-11-23 ENCOUNTER — Ambulatory Visit (HOSPITAL_BASED_OUTPATIENT_CLINIC_OR_DEPARTMENT_OTHER): Payer: Medicare Other | Admitting: Pharmacist

## 2014-11-23 ENCOUNTER — Other Ambulatory Visit (HOSPITAL_BASED_OUTPATIENT_CLINIC_OR_DEPARTMENT_OTHER): Payer: Medicare Other

## 2014-11-23 DIAGNOSIS — Z86718 Personal history of other venous thrombosis and embolism: Secondary | ICD-10-CM

## 2014-11-23 DIAGNOSIS — I482 Chronic atrial fibrillation, unspecified: Secondary | ICD-10-CM

## 2014-11-23 LAB — PROTIME-INR
INR: 2.4 (ref 2.00–3.50)
Protime: 28.8 Seconds — ABNORMAL HIGH (ref 10.6–13.4)

## 2014-11-23 LAB — POCT INR: INR: 2.4

## 2014-11-23 NOTE — Progress Notes (Signed)
INR = 2.4    Goal 2-3 INR is within goal range. No complications of anticoagulation noted. No dietary or medication changes. Patient states she has an appt with cardiology on 9/29. She states that Dr. Aundra Dubin has told her that when her INR has been therapeutic for 4 weeks he would like to schedule her for cardioversion in  EP lab to treat her Afib. She states that she is not willing to have this procedure until after returning from a trip to [place] to visit her family. She leaves for this trip on October 1 and will be gone approximately 2 weeks. Her INR will need to be checked prior to her appt with Dr. Aundra Dubin. We will see her again to check her INR prior to that appt. She is aware the Coumadin clinic is closing. Her next appt with Dr. Julien Nordmann is 04/30/2015. If possible, we will transition her anticoagulation to his management at the next CC appt. She will take Coumadin 5mg  daily except 7.5mg  on Tuesdays.  Recheck INR in 2 weeks on 12/07/14: Lab at 2:30pm and Coumadin clinic at 2:45pm.  Theone Murdoch, PharmD

## 2014-11-27 ENCOUNTER — Other Ambulatory Visit: Payer: Self-pay | Admitting: Family Medicine

## 2014-11-27 NOTE — Telephone Encounter (Signed)
Please clarify.  Should not be due until end of September.

## 2014-11-27 NOTE — Telephone Encounter (Signed)
Last visit 08/15/14 Last refill 06/20/14 #90 5 refill

## 2014-12-02 ENCOUNTER — Other Ambulatory Visit: Payer: Self-pay | Admitting: Family Medicine

## 2014-12-03 NOTE — Telephone Encounter (Signed)
Last visit 08/15/14 Last refill 06/20/14 #90 5 refill

## 2014-12-03 NOTE — Telephone Encounter (Signed)
Refill for 6 months. 

## 2014-12-04 ENCOUNTER — Telehealth: Payer: Self-pay | Admitting: Family Medicine

## 2014-12-04 NOTE — Telephone Encounter (Signed)
Pt is aware.  

## 2014-12-04 NOTE — Telephone Encounter (Signed)
I already called in Rx to pharmacy for #90 5 refills

## 2014-12-04 NOTE — Telephone Encounter (Signed)
Pt needs refill on alprazolam 1 mg #30 w/refills seen to [facility] [address] [employer]

## 2014-12-07 ENCOUNTER — Other Ambulatory Visit (HOSPITAL_BASED_OUTPATIENT_CLINIC_OR_DEPARTMENT_OTHER): Payer: Medicare Other

## 2014-12-07 ENCOUNTER — Ambulatory Visit (HOSPITAL_BASED_OUTPATIENT_CLINIC_OR_DEPARTMENT_OTHER): Payer: Medicare Other | Admitting: Pharmacist

## 2014-12-07 DIAGNOSIS — I82409 Acute embolism and thrombosis of unspecified deep veins of unspecified lower extremity: Secondary | ICD-10-CM

## 2014-12-07 DIAGNOSIS — I482 Chronic atrial fibrillation, unspecified: Secondary | ICD-10-CM

## 2014-12-07 LAB — PROTIME-INR
INR: 4.6 — AB (ref 2.00–3.50)
PROTIME: 55.2 s — AB (ref 10.6–13.4)

## 2014-12-07 LAB — POCT INR: INR: 4.6

## 2014-12-07 NOTE — Patient Instructions (Signed)
INR above goal Hold x 2 days. Restart at decreased dose of 5 mg daily Recheck INR in 1 weeks on 12/14/14: Lab at 2:30pm and Coumadin clinic at 2:45pm.

## 2014-12-07 NOTE — Progress Notes (Signed)
INR above goal today at 4.6 (goal 2-3) INR has been fluctuating as of late. Unsure why this is happening since patient reports no issues or changes No missed or extra doses No diet or medication changes Pt was tripped by a dog this past week and fell on her right side.  Luckily she has no cuts or bruises from this. Only very small minor bruises on her arm and leg Pt is leaving for a few weeks on 9/25 for a [place] trip to see her family. We will see her next week for 1 last coumadin clinic visit Plan: Hold x 2 days. Restart at decreased dose of 5 mg daily Recheck INR in 1 weeks on 12/14/14: Lab at 2:30pm and Coumadin clinic at 2:45pm.

## 2014-12-14 ENCOUNTER — Other Ambulatory Visit (HOSPITAL_BASED_OUTPATIENT_CLINIC_OR_DEPARTMENT_OTHER): Payer: Medicare Other

## 2014-12-14 ENCOUNTER — Ambulatory Visit (HOSPITAL_BASED_OUTPATIENT_CLINIC_OR_DEPARTMENT_OTHER): Payer: Medicare Other | Admitting: Pharmacist

## 2014-12-14 DIAGNOSIS — I482 Chronic atrial fibrillation, unspecified: Secondary | ICD-10-CM

## 2014-12-14 DIAGNOSIS — I82402 Acute embolism and thrombosis of unspecified deep veins of left lower extremity: Secondary | ICD-10-CM | POA: Diagnosis not present

## 2014-12-14 LAB — PROTIME-INR
INR: 2.7 (ref 2.00–3.50)
PROTIME: 32.4 s — AB (ref 10.6–13.4)

## 2014-12-14 LAB — POCT INR: INR: 2.7

## 2014-12-14 NOTE — Patient Instructions (Signed)
Continue coumadin 5mg  daily.  Recheck INR in 4 weeks on 10/20 after you return from your trip to [place] (9/28 - 10/16). Lab is at 2:30pm. Dr. Julien Nordmann will review your INR results.

## 2014-12-14 NOTE — Progress Notes (Signed)
INR within goal today. Pt took coumadin as instructed at last visit. No missed or extra coumadin doses. No changes in diet or medications. No unusual bruising.  Pt did cut her leg when a knife fell from the counter.  Bleeding stopped with pressure. Healing well today. No s/s of clotting noted. No planned upcoming procedures. Pt is going to cancel appointment scheduled with cardiologist on 9/29. She is leaving for [place] for 3 weeks on 9/28.  She will return on 10/16. She will call if she notices any excessive bruising or any bleeding. She is flying to [place] but knows to get up and move around very frequently. Continue coumadin 5mg  daily. Recheck INR in 4 weeks on 10/20 after trip to [place] (9/28 - 10/16). Lab is at 2:30pm. CBC ordered for review with next visit. Dr. Julien Nordmann will review INR results. Last [facility] coumadin clinic visit.

## 2014-12-20 ENCOUNTER — Encounter: Payer: Self-pay | Admitting: Cardiology

## 2014-12-20 ENCOUNTER — Ambulatory Visit (INDEPENDENT_AMBULATORY_CARE_PROVIDER_SITE_OTHER): Payer: Medicare Other | Admitting: Cardiology

## 2014-12-20 VITALS — BP 120/70 | HR 85 | Ht 66.5 in | Wt 227.0 lb

## 2014-12-20 DIAGNOSIS — E785 Hyperlipidemia, unspecified: Secondary | ICD-10-CM | POA: Diagnosis not present

## 2014-12-20 DIAGNOSIS — I480 Paroxysmal atrial fibrillation: Secondary | ICD-10-CM | POA: Diagnosis not present

## 2014-12-20 DIAGNOSIS — I5032 Chronic diastolic (congestive) heart failure: Secondary | ICD-10-CM

## 2014-12-20 DIAGNOSIS — I2510 Atherosclerotic heart disease of native coronary artery without angina pectoris: Secondary | ICD-10-CM

## 2014-12-20 LAB — LIPID PANEL
CHOLESTEROL: 124 mg/dL (ref 0–200)
HDL: 56.8 mg/dL (ref >39.00)
LDL CALC: 53 mg/dL (ref 0–99)
NonHDL: 66.9
TRIGLYCERIDES: 71 mg/dL (ref 0.0–149.0)
Total CHOL/HDL Ratio: 2
VLDL: 14.2 mg/dL (ref 0.0–40.0)

## 2014-12-20 LAB — HEPATIC FUNCTION PANEL
ALT: 17 U/L (ref 0–35)
AST: 18 U/L (ref 0–37)
Albumin: 3.9 g/dL (ref 3.5–5.2)
Alkaline Phosphatase: 67 U/L (ref 39–117)
BILIRUBIN DIRECT: 0.1 mg/dL (ref 0.0–0.3)
TOTAL PROTEIN: 6.9 g/dL (ref 6.0–8.3)
Total Bilirubin: 0.5 mg/dL (ref 0.2–1.2)

## 2014-12-20 LAB — PROTIME-INR
INR: 4.1 ratio — ABNORMAL HIGH (ref 0.8–1.0)
Prothrombin Time: 44.1 s — ABNORMAL HIGH (ref 9.6–13.1)

## 2014-12-20 LAB — BASIC METABOLIC PANEL
BUN: 14 mg/dL (ref 6–23)
CHLORIDE: 106 meq/L (ref 96–112)
CO2: 29 meq/L (ref 19–32)
CREATININE: 1.07 mg/dL (ref 0.40–1.20)
Calcium: 9.4 mg/dL (ref 8.4–10.5)
GFR: 53.76 mL/min — ABNORMAL LOW (ref >60.00)
Glucose, Bld: 125 mg/dL — ABNORMAL HIGH (ref 70–99)
Potassium: 4.4 mEq/L (ref 3.5–5.1)
Sodium: 141 mEq/L (ref 135–145)

## 2014-12-20 LAB — TSH: TSH: 0.61 u[IU]/mL (ref 0.35–4.50)

## 2014-12-20 NOTE — Progress Notes (Signed)
Patient ID: [name], female   DOB: 1945/07/13, 69 y.o.   MRN: [id number] PCP: Dr. Elease Hashimoto  69 yo with history of retroperitoneal fibrosis and non-Hodgkin's lymphoma as well as prior DVT presented initially for evaluation of an abnormal myoview.  A Lexiscan myoview was done in 1/11. This was an equivocal study due to subdiaphragmatic nuclear uptake. EF was calculated to be 49% with global hypokinesis. Given the equivocal myoview, I had her do a coronary CT angiogram. This showed a moderate calcium score of 161 (putting her at a high risk percentile for her age) and a possible moderate mid LAD stenosis (though some degradation of images by respiratory artifact). Echo was done to reassess LV systolic function (reported as mildly decreased on myoview). This confirmed mild LV systolic dysfunction, EF 87-56% with moderate diastolic dysfunction. We discussed catheterization, but patient strongly preferred medical treatment.   She has been diagnosed with paroxysmal atrial fibrillation.  She was started on warfarin and diltiazem CD.  Last echo in 8/15 showed EF 50-55%, moderate LV hypertrophy, probably mild mitral stenosis, normal RV size and systolic function.    She was initially on dronedarone to try to maintain NSR, but developed persistent atrial fibrillation.  I changed her antiarrhythmic to amiodarone.  She has been on amiodarone since 7/16.  Her INRs have been therapeutic since 11/16/14. She does not feel palpitations and does not know that she is in atrial fibrillation.  She continues to take Lasix.  She can walk 1 block without dyspnea, no problems walking around her house.  She uses a cane for stability.  No chest pain.  No orthopnea/PND.  Weight is down 7 lbs.   Labs (12/10): HCT 34.5, K 4.3, creatinine 1.2, LDL 101, HDL 46  Labs (7/11): LFTs normal, LDL 69, HDL 47, BNP 21, K 4.3, creatinine 1.2  Labs (1/12): K 5.2, creatinine 1.0, LFTs normal, LDL 59, HDL 46  Labs (6/13): LDL 54, HDL 52, K  4.7, creatinine 1.1 Labs (9/14): LDL 79, HDL 56 Labs (4/15): TSH normal Labs (11/15): K 5.2, creatinine 1.2, LDL 69, HDL 63 Labs (3/16): BNP 168, K 4, creatinine 1.08 Labs (4/16): K 4.1, creatinine 1.12 Labs (7/16): HCT 36.9, LFTs normal, TSH normal  ECG: atrial fibrillation at 85  Past Medical History:   1. Anxiety Disorder  2. Arthritis  3. Depression  4. Retroperitoneal fibrosis with non-Hodgkin's lymphoma. This was treated with chemotherapy in 2006 and is in remission.  5. Adenomatous Colon Polyps  6. Diverticulosis  7. DVT left leg/thigh: in both 2005 and 2006, associated with lymphoma. She is on chronic coumadin.  8. Obesity  9. Left ureteral stent (ureter involved by retroperitoneal fibrosis).  10. CAD: Lexiscan myoview (1/11): EF 49% with global hypokinesis. There appeared to be a mild anterior perfusion defect that could represent ischemia. However, there was significant subdiaphragmatic nuclear activity that was worse in stress than rest so it is possible that the defect was artifactual. This study was equivocal. Coronary CT angiogram was done to follow this up in 2/11. This showed calcium score of 161 (86th percentile for her age and race) and a possible moderate mid LAD stenosis.  11. Mild systolic dysfunction: EF 43% on myoview. Echo (2/11): EF 45-50%, moderate diastolic dysfunction, mild MR, mild biatrial enlargement, PASP 34 mmHg. It is possible that this could be chemotherapy-related.  Echo (11/14): EF 50-55%, grade II diastolic dysfunction, ?moderate mitral stenosis, no MR. Echo (8/15): EF 50-55%, moderate LVH, probably mild mitral stenosis with mean gradient  2, normal PA pressure, normal RV.  12. Hyperlipidemia: myalgias with Crestor.  13. Atrial fibrillation: Paroxysmal.  14. Cough with ACEI 15. PFTs (7/16): Near normal with minimal obstructive airways disease noted.   Family History:  Family History of Arthritis mother  Family History Diabetes 1st degree relative  deceased sister  Family History Hypertension deceased sister  Family History of Stroke F 1st degree relative <60 deceased sister  Family History of Cardiovascular disorder mother  Sister had MI and CVA at 52  Mother with CABG in her 60s   Social History:  Retired  Married  Quit smoking 5/10  Alcohol use-yes, rarely  Regular exercise-no   Review of Systems  All systems reviewed and negative except as per HPI.   Current Outpatient Prescriptions  Medication Sig Dispense Refill  . ALPRAZolam (XANAX) 1 MG tablet take 1 tablet by mouth three times a day 90 tablet 5  . amiodarone (PACERONE) 200 MG tablet 1 tablet by mouth two times a day for 2 weeks, then decrease to 1 tablet by mouth daily 45 tablet 3  . b complex vitamins capsule Take 2 capsules by mouth daily.     . beta carotene w/minerals (OCUVITE) tablet Take 1 tablet by mouth daily.    Marland Kitchen buPROPion (WELLBUTRIN XL) 300 MG 24 hr tablet take 1 tablet by mouth once daily 90 tablet 1  . Cholecalciferol (VITAMIN D) 2000 UNITS CAPS Take 2,000 Int'l Units by mouth.    . co-enzyme Q-10 50 MG capsule Take 100 mg by mouth daily.    . Diclofenac Sodium (PENNSAID) 2 % SOLN Place 1 application onto the skin 3 (three) times daily as needed. 112 g 3  . diltiazem (CARDIZEM CD) 180 MG 24 hr capsule Take 1 capsule (180 mg total) by mouth 2 (two) times daily. 60 capsule 6  . DULoxetine (CYMBALTA) 60 MG capsule take 1 capsule by mouth once daily 30 capsule 2  . fluticasone (FLONASE) 50 MCG/ACT nasal spray instill 2 sprays into each nostril once daily 16 g 6  . furosemide (LASIX) 20 MG tablet Take 1 tablet (20 mg total) by mouth daily. 90 tablet 2  . HYDROcodone-acetaminophen (HYCET) 7.5-325 mg/15 ml solution   0  . loratadine (CLARITIN) 10 MG tablet Take 10 mg by mouth daily.     Marland Kitchen losartan (COZAAR) 25 MG tablet Take 1 tablet (25 mg total) by mouth daily. 90 tablet 3  . Lysine 500 MG TABS Take 500 mg by mouth daily.    Marland Kitchen NATURE-THROID 113.75 MG TABS  Take 1 tablet by mouth daily.  0  . NITROSTAT 0.4 MG SL tablet place 1 tablet under the tongue every 5 minutes for UP TO 3 doses if needed 25 tablet 12  . omeprazole (PRILOSEC) 20 MG capsule Take 20 mg by mouth every morning.    Marland Kitchen oxyCODONE (OXY IR/ROXICODONE) 5 MG immediate release tablet Take 1 tablet (5 mg total) by mouth every 6 (six) hours as needed for severe pain. 30 tablet 0  . Pitavastatin Calcium (LIVALO) 2 MG TABS take 1 tablet by mouth once daily 30 tablet 6  . Pregnenolone Micronized POWD by Does not apply route.    . warfarin (COUMADIN) 2.5 MG tablet Take as directed by Coumadin Clinic 30 tablet 2  . warfarin (COUMADIN) 5 MG tablet Take 5 mg daily except 7.5 mg on MWF or as directed 90 tablet 3   No current facility-administered medications for this visit.   BP 120/70 mmHg  Pulse  85  Ht 5' 6.5" (1.689 m)  Wt 227 lb (102.967 kg)  BMI 36.09 kg/m2 General: NAD, obese.  Neck: No JVD, no thyromegaly or thyroid nodule.  Lungs: Clear to auscultation bilaterally with normal respiratory effort. CV: Nondisplaced PMI.  Heart irregular S1/S2, no S3/S4, no murmur.  Bilateral varicosities with trace ankle edema.  No carotid bruit.  Normal pedal pulses.  Abdomen: Soft, nontender, no hepatosplenomegaly, no distention.  Neurologic: Alert and oriented x 3.  Psych: Normal affect. Extremities: No clubbing or cyanosis.   Assessment/Plan: 1. Atrial fibrillation: Persistent.  She failed dronedarone and is now on amiodarone.  She is not a good candidate for class Ic agents given CAD.   I will give her one chance at NSR with cardioversion on amiodarone.  If this fails, would stop amiodarone and just follow rate control/anticoagulation strategy as she is not particularly symptomatic in atrial fibrillation.  - INR has been therapeutic since 11/16/14.  I offered her DCCV next week, but she is going to be going to [place] for vacation and wants to wait until she returns.  She will get INR today and then INR  when she returns.  If she remains therapeutic, will arrange for DCCV at that time.   - Baseline PFTs on amiodarone done and normal.  LFTs/TSH normal in 7/16, recheck today.  Will need regular eye exams if she stays on amiodarone.    - Continue warfarin (has preferred this to NOAC).  - Continue current diltiazem CD.  2. CAD: She is on warfarin so not on ASA.  No chest pain. Continue statin.  3. Hyperlipidemia: Check lipids today.  Goal LDL < 70 given known CAD from coronary CTA in past.  4. Mitral stenosis: Reported on echo from outside lab.  We reassessed this by echo here in 8/15, suspect no more than mild mitral stenosis.  5. Chronic cough: Resolved off ACEI.  6. Chronic diastolic CHF: Triggered by atrial fibrillation in the past.  Currently seems to be doing well on Lasix 20 mg daily.    Loralie Champagne 12/20/2014

## 2014-12-20 NOTE — Patient Instructions (Signed)
Medication Instructions:  No changes today  Labwork: BMET/TSH/liver profile/PT/INR/lipid profile today  Testing/Procedures: None today  Follow-Up: Your physician wants you to follow-up the first of January with Dr Aundra Dubin.  You will receive a reminder letter in the mail two months in advance. If you don't receive a letter, please call our office to schedule the follow-up appointment.   Any Other Special Instructions Will Be Listed Below (If Applicable).  Call Dr Julien Nordmann and let him know that you had a pro-time at our office today.  Call our office when you return from [place] to discuss making arrangements for a cardioversion.

## 2014-12-25 ENCOUNTER — Telehealth: Payer: Self-pay | Admitting: Cardiology

## 2014-12-25 NOTE — Telephone Encounter (Signed)
New message     Calling to let you know she got your message regarding patient's INR

## 2014-12-25 NOTE — Telephone Encounter (Signed)
Received message with INR result from [facility] . Pt seen in coumadin clinic 9/23 and scheduled to come back oct 20.

## 2015-01-08 DIAGNOSIS — W19XXXA Unspecified fall, initial encounter: Secondary | ICD-10-CM | POA: Diagnosis not present

## 2015-01-08 DIAGNOSIS — F411 Generalized anxiety disorder: Secondary | ICD-10-CM | POA: Diagnosis not present

## 2015-01-08 DIAGNOSIS — S0190XA Unspecified open wound of unspecified part of head, initial encounter: Secondary | ICD-10-CM | POA: Diagnosis not present

## 2015-01-08 DIAGNOSIS — Z8673 Personal history of transient ischemic attack (TIA), and cerebral infarction without residual deficits: Secondary | ICD-10-CM | POA: Diagnosis not present

## 2015-01-08 DIAGNOSIS — S59901A Unspecified injury of right elbow, initial encounter: Secondary | ICD-10-CM | POA: Diagnosis not present

## 2015-01-08 DIAGNOSIS — Z923 Personal history of irradiation: Secondary | ICD-10-CM | POA: Diagnosis not present

## 2015-01-08 DIAGNOSIS — S3993XA Unspecified injury of pelvis, initial encounter: Secondary | ICD-10-CM | POA: Diagnosis not present

## 2015-01-08 DIAGNOSIS — S0511XA Contusion of eyeball and orbital tissues, right eye, initial encounter: Secondary | ICD-10-CM | POA: Diagnosis not present

## 2015-01-08 DIAGNOSIS — M50222 Other cervical disc displacement at C5-C6 level: Secondary | ICD-10-CM | POA: Diagnosis not present

## 2015-01-08 DIAGNOSIS — R930 Abnormal findings on diagnostic imaging of skull and head, not elsewhere classified: Secondary | ICD-10-CM | POA: Diagnosis not present

## 2015-01-08 DIAGNOSIS — Z9221 Personal history of antineoplastic chemotherapy: Secondary | ICD-10-CM | POA: Diagnosis not present

## 2015-01-08 DIAGNOSIS — I700 Atherosclerosis of aorta: Secondary | ICD-10-CM | POA: Diagnosis not present

## 2015-01-08 DIAGNOSIS — S0182XA Laceration with foreign body of other part of head, initial encounter: Secondary | ICD-10-CM | POA: Diagnosis not present

## 2015-01-08 DIAGNOSIS — S0181XA Laceration without foreign body of other part of head, initial encounter: Secondary | ICD-10-CM | POA: Diagnosis not present

## 2015-01-08 DIAGNOSIS — S0993XA Unspecified injury of face, initial encounter: Secondary | ICD-10-CM | POA: Diagnosis not present

## 2015-01-08 DIAGNOSIS — S4990XA Unspecified injury of shoulder and upper arm, unspecified arm, initial encounter: Secondary | ICD-10-CM | POA: Diagnosis not present

## 2015-01-08 DIAGNOSIS — I517 Cardiomegaly: Secondary | ICD-10-CM | POA: Diagnosis not present

## 2015-01-08 DIAGNOSIS — I4891 Unspecified atrial fibrillation: Secondary | ICD-10-CM | POA: Diagnosis not present

## 2015-01-08 DIAGNOSIS — M47812 Spondylosis without myelopathy or radiculopathy, cervical region: Secondary | ICD-10-CM | POA: Diagnosis not present

## 2015-01-08 DIAGNOSIS — M19041 Primary osteoarthritis, right hand: Secondary | ICD-10-CM | POA: Diagnosis not present

## 2015-01-08 DIAGNOSIS — R51 Headache: Secondary | ICD-10-CM | POA: Diagnosis not present

## 2015-01-08 DIAGNOSIS — Z86718 Personal history of other venous thrombosis and embolism: Secondary | ICD-10-CM | POA: Diagnosis not present

## 2015-01-08 DIAGNOSIS — I6522 Occlusion and stenosis of left carotid artery: Secondary | ICD-10-CM | POA: Diagnosis not present

## 2015-01-08 DIAGNOSIS — M19011 Primary osteoarthritis, right shoulder: Secondary | ICD-10-CM | POA: Diagnosis not present

## 2015-01-08 DIAGNOSIS — I6782 Cerebral ischemia: Secondary | ICD-10-CM | POA: Diagnosis not present

## 2015-01-08 DIAGNOSIS — I10 Essential (primary) hypertension: Secondary | ICD-10-CM | POA: Diagnosis not present

## 2015-01-08 DIAGNOSIS — M47819 Spondylosis without myelopathy or radiculopathy, site unspecified: Secondary | ICD-10-CM | POA: Diagnosis not present

## 2015-01-08 DIAGNOSIS — Z87891 Personal history of nicotine dependence: Secondary | ICD-10-CM | POA: Diagnosis not present

## 2015-01-08 DIAGNOSIS — K449 Diaphragmatic hernia without obstruction or gangrene: Secondary | ICD-10-CM | POA: Diagnosis not present

## 2015-01-08 DIAGNOSIS — W108XXA Fall (on) (from) other stairs and steps, initial encounter: Secondary | ICD-10-CM | POA: Diagnosis not present

## 2015-01-08 DIAGNOSIS — G238 Other specified degenerative diseases of basal ganglia: Secondary | ICD-10-CM | POA: Diagnosis not present

## 2015-01-08 DIAGNOSIS — I480 Paroxysmal atrial fibrillation: Secondary | ICD-10-CM | POA: Diagnosis not present

## 2015-01-08 DIAGNOSIS — Z96653 Presence of artificial knee joint, bilateral: Secondary | ICD-10-CM | POA: Diagnosis not present

## 2015-01-08 DIAGNOSIS — C8590 Non-Hodgkin lymphoma, unspecified, unspecified site: Secondary | ICD-10-CM | POA: Diagnosis not present

## 2015-01-08 DIAGNOSIS — S42291A Other displaced fracture of upper end of right humerus, initial encounter for closed fracture: Secondary | ICD-10-CM | POA: Diagnosis not present

## 2015-01-08 DIAGNOSIS — Y9389 Activity, other specified: Secondary | ICD-10-CM | POA: Diagnosis not present

## 2015-01-08 DIAGNOSIS — S42211A Unspecified displaced fracture of surgical neck of right humerus, initial encounter for closed fracture: Secondary | ICD-10-CM | POA: Diagnosis not present

## 2015-01-08 DIAGNOSIS — M799 Soft tissue disorder, unspecified: Secondary | ICD-10-CM | POA: Diagnosis not present

## 2015-01-08 DIAGNOSIS — I6350 Cerebral infarction due to unspecified occlusion or stenosis of unspecified cerebral artery: Secondary | ICD-10-CM | POA: Diagnosis not present

## 2015-01-08 DIAGNOSIS — S42351A Displaced comminuted fracture of shaft of humerus, right arm, initial encounter for closed fracture: Secondary | ICD-10-CM | POA: Diagnosis not present

## 2015-01-08 DIAGNOSIS — S6991XA Unspecified injury of right wrist, hand and finger(s), initial encounter: Secondary | ICD-10-CM | POA: Diagnosis not present

## 2015-01-08 DIAGNOSIS — I361 Nonrheumatic tricuspid (valve) insufficiency: Secondary | ICD-10-CM | POA: Diagnosis not present

## 2015-01-08 DIAGNOSIS — S52501A Unspecified fracture of the lower end of right radius, initial encounter for closed fracture: Secondary | ICD-10-CM | POA: Diagnosis not present

## 2015-01-08 DIAGNOSIS — Z043 Encounter for examination and observation following other accident: Secondary | ICD-10-CM | POA: Diagnosis not present

## 2015-01-08 DIAGNOSIS — Z7901 Long term (current) use of anticoagulants: Secondary | ICD-10-CM | POA: Diagnosis not present

## 2015-01-08 DIAGNOSIS — R269 Unspecified abnormalities of gait and mobility: Secondary | ICD-10-CM | POA: Diagnosis not present

## 2015-01-08 DIAGNOSIS — S3991XA Unspecified injury of abdomen, initial encounter: Secondary | ICD-10-CM | POA: Diagnosis not present

## 2015-01-08 DIAGNOSIS — I350 Nonrheumatic aortic (valve) stenosis: Secondary | ICD-10-CM | POA: Diagnosis not present

## 2015-01-08 DIAGNOSIS — M5030 Other cervical disc degeneration, unspecified cervical region: Secondary | ICD-10-CM | POA: Diagnosis not present

## 2015-01-08 DIAGNOSIS — I63522 Cerebral infarction due to unspecified occlusion or stenosis of left anterior cerebral artery: Secondary | ICD-10-CM | POA: Diagnosis not present

## 2015-01-08 DIAGNOSIS — S0990XA Unspecified injury of head, initial encounter: Secondary | ICD-10-CM | POA: Diagnosis not present

## 2015-01-08 DIAGNOSIS — I340 Nonrheumatic mitral (valve) insufficiency: Secondary | ICD-10-CM | POA: Diagnosis not present

## 2015-01-08 DIAGNOSIS — S62101A Fracture of unspecified carpal bone, right wrist, initial encounter for closed fracture: Secondary | ICD-10-CM | POA: Diagnosis not present

## 2015-01-08 DIAGNOSIS — I639 Cerebral infarction, unspecified: Secondary | ICD-10-CM | POA: Diagnosis not present

## 2015-01-08 DIAGNOSIS — R40241 Glasgow coma scale score 13-15, unspecified time: Secondary | ICD-10-CM | POA: Diagnosis not present

## 2015-01-08 DIAGNOSIS — S42301A Unspecified fracture of shaft of humerus, right arm, initial encounter for closed fracture: Secondary | ICD-10-CM | POA: Diagnosis not present

## 2015-01-08 DIAGNOSIS — Z880 Allergy status to penicillin: Secondary | ICD-10-CM | POA: Diagnosis not present

## 2015-01-08 DIAGNOSIS — R260 Ataxic gait: Secondary | ICD-10-CM | POA: Diagnosis not present

## 2015-01-08 DIAGNOSIS — R220 Localized swelling, mass and lump, head: Secondary | ICD-10-CM | POA: Diagnosis not present

## 2015-01-08 DIAGNOSIS — S129XXA Fracture of neck, unspecified, initial encounter: Secondary | ICD-10-CM | POA: Diagnosis not present

## 2015-01-08 DIAGNOSIS — Z049 Encounter for examination and observation for unspecified reason: Secondary | ICD-10-CM | POA: Diagnosis not present

## 2015-01-08 DIAGNOSIS — S01421A Laceration with foreign body of right cheek and temporomandibular area, initial encounter: Secondary | ICD-10-CM | POA: Diagnosis not present

## 2015-01-08 DIAGNOSIS — S299XXA Unspecified injury of thorax, initial encounter: Secondary | ICD-10-CM | POA: Diagnosis not present

## 2015-01-08 DIAGNOSIS — I638 Other cerebral infarction: Secondary | ICD-10-CM | POA: Diagnosis not present

## 2015-01-08 DIAGNOSIS — I5030 Unspecified diastolic (congestive) heart failure: Secondary | ICD-10-CM | POA: Diagnosis not present

## 2015-01-08 DIAGNOSIS — S42294A Other nondisplaced fracture of upper end of right humerus, initial encounter for closed fracture: Secondary | ICD-10-CM | POA: Diagnosis not present

## 2015-01-10 ENCOUNTER — Other Ambulatory Visit: Payer: Medicare Other

## 2015-01-15 ENCOUNTER — Other Ambulatory Visit: Payer: Self-pay | Admitting: Cardiology

## 2015-01-15 ENCOUNTER — Telehealth: Payer: Self-pay | Admitting: *Deleted

## 2015-01-15 ENCOUNTER — Telehealth: Payer: Self-pay | Admitting: Internal Medicine

## 2015-01-15 DIAGNOSIS — I82409 Acute embolism and thrombosis of unspecified deep veins of unspecified lower extremity: Secondary | ICD-10-CM

## 2015-01-15 NOTE — Telephone Encounter (Signed)
Called pt regarding missed appt 10/20 for INR. Unable to reach pt lmovm for pt to call office to r/s lab appt. POF to scheduling.

## 2015-01-15 NOTE — Telephone Encounter (Signed)
lvm for pt regarding to r/s lab.Marland KitchenMarland KitchenMarland Kitchen

## 2015-01-16 ENCOUNTER — Telehealth: Payer: Self-pay | Admitting: Internal Medicine

## 2015-01-16 NOTE — Telephone Encounter (Signed)
returned call and s.w. pt and sched lab....pt ok and aware

## 2015-01-17 ENCOUNTER — Other Ambulatory Visit (HOSPITAL_BASED_OUTPATIENT_CLINIC_OR_DEPARTMENT_OTHER): Payer: Medicare Other

## 2015-01-17 ENCOUNTER — Telehealth: Payer: Self-pay | Admitting: *Deleted

## 2015-01-17 DIAGNOSIS — I82402 Acute embolism and thrombosis of unspecified deep veins of left lower extremity: Secondary | ICD-10-CM

## 2015-01-17 LAB — PROTIME-INR
INR: 3.9 — AB (ref 2.00–3.50)
PROTIME: 46.8 s — AB (ref 10.6–13.4)

## 2015-01-17 LAB — CBC WITH DIFFERENTIAL/PLATELET
BASO%: 0.5 % (ref 0.0–2.0)
Basophils Absolute: 0 10*3/uL (ref 0.0–0.1)
EOS%: 2.3 % (ref 0.0–7.0)
Eosinophils Absolute: 0.2 10*3/uL (ref 0.0–0.5)
HCT: 31 % — ABNORMAL LOW (ref 34.8–46.6)
HGB: 10.4 g/dL — ABNORMAL LOW (ref 11.6–15.9)
LYMPH#: 2.5 10*3/uL (ref 0.9–3.3)
LYMPH%: 37.6 % (ref 14.0–49.7)
MCH: 29.3 pg (ref 25.1–34.0)
MCHC: 33.5 g/dL (ref 31.5–36.0)
MCV: 87.3 fL (ref 79.5–101.0)
MONO#: 0.6 10*3/uL (ref 0.1–0.9)
MONO%: 9.5 % (ref 0.0–14.0)
NEUT#: 3.3 10*3/uL (ref 1.5–6.5)
NEUT%: 50.1 % (ref 38.4–76.8)
Platelets: 260 10*3/uL (ref 145–400)
RBC: 3.55 10*6/uL — ABNORMAL LOW (ref 3.70–5.45)
RDW: 14.5 % (ref 11.2–14.5)
WBC: 6.5 10*3/uL (ref 3.9–10.3)
nRBC: 0 % (ref 0–0)

## 2015-01-17 NOTE — Telephone Encounter (Signed)
Pt labs reviewed with MD on Call- Dr. Lindi Adie advised pt hold next dose of Coumadin, then take 5mg  daily x 1 week and recheck labs in 1 week. Unable to reach pt, lmovm with above instructions, request pt to call office to confirm message rcvd.

## 2015-01-18 ENCOUNTER — Encounter: Payer: Self-pay | Admitting: Adult Health

## 2015-01-18 ENCOUNTER — Ambulatory Visit (INDEPENDENT_AMBULATORY_CARE_PROVIDER_SITE_OTHER): Payer: Medicare Other | Admitting: Adult Health

## 2015-01-18 VITALS — BP 104/70 | Temp 98.2°F

## 2015-01-18 DIAGNOSIS — Z760 Encounter for issue of repeat prescription: Secondary | ICD-10-CM

## 2015-01-18 DIAGNOSIS — Z4802 Encounter for removal of sutures: Secondary | ICD-10-CM

## 2015-01-18 MED ORDER — OXYCODONE HCL 5 MG PO TABS
5.0000 mg | ORAL_TABLET | ORAL | Status: DC | PRN
Start: 2015-01-18 — End: 2015-02-04

## 2015-01-18 NOTE — Progress Notes (Signed)
Pre visit review using our clinic review tool, if applicable. No additional management support is needed unless otherwise documented below in the visit note.   Pt did not want to weigh//acm

## 2015-01-18 NOTE — Progress Notes (Signed)
   Subjective:    Patient ID: [name], female    DOB: 26-Mar-1945, 69 y.o.   MRN: [id number]  HPI  69 year old female, patient of Dr. Elease Hashimoto. Presents to the office today for suture removal to her face. She reports that she fell six feet onto cement after tripping going up stairs while visiting her son in [place]. She was seen and admitted at a hospital on [place]. She has fractures to her right wrist, right radius, and right humerus.- reported by patient. She is waiting to see orthopedics in [place].  She is on Coumadin and reports that her CT was negative.    She has 8 sutures in the right face. This incident  happened 5 days ago.     Review of Systems  Constitutional: Negative.   Respiratory: Negative.   Musculoskeletal: Positive for myalgias, back pain, arthralgias and gait problem.  Skin: Positive for color change (to right side of face and right arm) and wound.  Neurological: Negative.   Hematological: Bruises/bleeds easily.  Psychiatric/Behavioral: Negative.   All other systems reviewed and are negative.      Objective:   Physical Exam  Constitutional: She is oriented to person, place, and time. She appears well-developed and well-nourished. No distress.  HENT:  Head: Normocephalic. Head is with laceration.    Right Ear: External ear normal.  Left Ear: External ear normal.  Nose: Nose normal.  Mouth/Throat: Oropharynx is clear and moist. No oropharyngeal exudate.  No battle signs, no raccoon eyes  Eyes: Conjunctivae and EOM are normal. Pupils are equal, round, and reactive to light. Right eye exhibits no discharge. Left eye exhibits no discharge.  Neck: Normal range of motion. Neck supple.  Cardiovascular: Normal rate, regular rhythm, normal heart sounds and intact distal pulses.  Exam reveals no gallop and no friction rub.   No murmur heard. Pulmonary/Chest: Effort normal and breath sounds normal. No respiratory distress. She has no wheezes. She  has no rales. She exhibits no tenderness.  Musculoskeletal: She exhibits edema and tenderness.  Slight swelling and pain to right malar bone. Pain with palpation.   Pain to right arm with movement.    Lymphadenopathy:    She has no cervical adenopathy.  Neurological: She is alert and oriented to person, place, and time.  Skin: Skin is warm and dry. She is not diaphoretic.  Has cast on right arm. Bruising noted from top of the cast to her upper arm. Bruising is in various stages of healing.   Has dried blood on face from wounds. 8 sutures visualized.     Psychiatric: She has a normal mood and affect. Her behavior is normal. Thought content normal.  Nursing note and vitals reviewed.      Assessment & Plan:  1. Visit for suture removal - 8 sutures removed from the left side of her face. Wounds were well healed. No signs of infection.  - Dried blood removed from right side of face - Arm sling given to patient for broken right arm - Will get information from [facility] and fax to [employer] - Follow up with PCP - Go to the ER with any blurred vision, headaches, or increased pain  2. Medication refill - oxyCODONE (ROXICODONE) 5 MG immediate release tablet; Take 1 tablet (5 mg total) by mouth every 4 (four) hours as needed for severe pain (take 5-10 mg as needed for pain).  Dispense: 30 tablet; Refill: 0

## 2015-01-18 NOTE — Patient Instructions (Signed)
I removed 8 sutures from the right side of your face.   Be mindful of signs and symptoms of infection and return if you have any.   Take 5-10 mg of Oxycodone for pain as needed  Follow up with Orthopedics. Once we get the paperwork, we will send it over to them.

## 2015-01-23 DIAGNOSIS — S62101A Fracture of unspecified carpal bone, right wrist, initial encounter for closed fracture: Secondary | ICD-10-CM | POA: Diagnosis not present

## 2015-01-23 DIAGNOSIS — S42291A Other displaced fracture of upper end of right humerus, initial encounter for closed fracture: Secondary | ICD-10-CM | POA: Diagnosis not present

## 2015-01-24 DIAGNOSIS — S52531A Colles' fracture of right radius, initial encounter for closed fracture: Secondary | ICD-10-CM | POA: Diagnosis not present

## 2015-01-25 ENCOUNTER — Encounter (HOSPITAL_COMMUNITY)
Admission: RE | Admit: 2015-01-25 | Discharge: 2015-01-25 | Disposition: A | Discharge status: Home / Self Care | Payer: Medicare Other | Source: Ambulatory Visit | Attending: Orthopedic Surgery | Admitting: Orthopedic Surgery

## 2015-01-25 ENCOUNTER — Telehealth: Payer: Self-pay | Admitting: Cardiology

## 2015-01-25 ENCOUNTER — Other Ambulatory Visit: Payer: Self-pay

## 2015-01-25 DIAGNOSIS — I4891 Unspecified atrial fibrillation: Secondary | ICD-10-CM | POA: Diagnosis not present

## 2015-01-25 DIAGNOSIS — Z86718 Personal history of other venous thrombosis and embolism: Secondary | ICD-10-CM | POA: Diagnosis not present

## 2015-01-25 DIAGNOSIS — Z87891 Personal history of nicotine dependence: Secondary | ICD-10-CM | POA: Diagnosis not present

## 2015-01-25 DIAGNOSIS — W19XXXA Unspecified fall, initial encounter: Secondary | ICD-10-CM | POA: Diagnosis not present

## 2015-01-25 DIAGNOSIS — Z7901 Long term (current) use of anticoagulants: Secondary | ICD-10-CM | POA: Insufficient documentation

## 2015-01-25 DIAGNOSIS — F419 Anxiety disorder, unspecified: Secondary | ICD-10-CM | POA: Insufficient documentation

## 2015-01-25 DIAGNOSIS — Z0183 Encounter for blood typing: Secondary | ICD-10-CM | POA: Diagnosis not present

## 2015-01-25 DIAGNOSIS — C8590 Non-Hodgkin lymphoma, unspecified, unspecified site: Secondary | ICD-10-CM | POA: Insufficient documentation

## 2015-01-25 DIAGNOSIS — Z01812 Encounter for preprocedural laboratory examination: Secondary | ICD-10-CM | POA: Insufficient documentation

## 2015-01-25 DIAGNOSIS — Z01818 Encounter for other preprocedural examination: Secondary | ICD-10-CM | POA: Insufficient documentation

## 2015-01-25 DIAGNOSIS — K219 Gastro-esophageal reflux disease without esophagitis: Secondary | ICD-10-CM | POA: Diagnosis not present

## 2015-01-25 DIAGNOSIS — F329 Major depressive disorder, single episode, unspecified: Secondary | ICD-10-CM | POA: Insufficient documentation

## 2015-01-25 DIAGNOSIS — Z79899 Other long term (current) drug therapy: Secondary | ICD-10-CM | POA: Insufficient documentation

## 2015-01-25 DIAGNOSIS — Z9221 Personal history of antineoplastic chemotherapy: Secondary | ICD-10-CM | POA: Diagnosis not present

## 2015-01-25 DIAGNOSIS — S42201A Unspecified fracture of upper end of right humerus, initial encounter for closed fracture: Secondary | ICD-10-CM | POA: Diagnosis not present

## 2015-01-25 DIAGNOSIS — E785 Hyperlipidemia, unspecified: Secondary | ICD-10-CM | POA: Diagnosis not present

## 2015-01-25 DIAGNOSIS — I2510 Atherosclerotic heart disease of native coronary artery without angina pectoris: Secondary | ICD-10-CM | POA: Diagnosis not present

## 2015-01-25 DIAGNOSIS — Z923 Personal history of irradiation: Secondary | ICD-10-CM | POA: Diagnosis not present

## 2015-01-25 LAB — CBC WITH DIFFERENTIAL/PLATELET
BASOS ABS: 0 10*3/uL (ref 0.0–0.1)
BASOS PCT: 1 %
EOS PCT: 1 %
Eosinophils Absolute: 0.1 10*3/uL (ref 0.0–0.7)
HCT: 33.3 % — ABNORMAL LOW (ref 36.0–46.0)
Hemoglobin: 10.7 g/dL — ABNORMAL LOW (ref 12.0–15.0)
LYMPHS PCT: 44 %
Lymphs Abs: 1.9 10*3/uL (ref 0.7–4.0)
MCH: 28.8 pg (ref 26.0–34.0)
MCHC: 32.1 g/dL (ref 30.0–36.0)
MCV: 89.5 fL (ref 78.0–100.0)
MONO ABS: 0.3 10*3/uL (ref 0.1–1.0)
Monocytes Relative: 6 %
Neutro Abs: 2.1 10*3/uL (ref 1.7–7.7)
Neutrophils Relative %: 48 %
PLATELETS: 211 10*3/uL (ref 150–400)
RBC: 3.72 MIL/uL — AB (ref 3.87–5.11)
RDW: 15.3 % (ref 11.5–15.5)
WBC: 4.4 10*3/uL (ref 4.0–10.5)

## 2015-01-25 LAB — COMPREHENSIVE METABOLIC PANEL
ALBUMIN: 3.1 g/dL — AB (ref 3.5–5.0)
ALT: 18 U/L (ref 14–54)
AST: 21 U/L (ref 15–41)
Alkaline Phosphatase: 105 U/L (ref 38–126)
Anion gap: 6 (ref 5–15)
BUN: 11 mg/dL (ref 6–20)
CHLORIDE: 106 mmol/L (ref 101–111)
CO2: 26 mmol/L (ref 22–32)
CREATININE: 1.06 mg/dL — AB (ref 0.44–1.00)
Calcium: 9.1 mg/dL (ref 8.9–10.3)
GFR calc Af Amer: >60 mL/min (ref >60)
GFR, EST NON AFRICAN AMERICAN: 52 mL/min — AB (ref >60)
GLUCOSE: 113 mg/dL — AB (ref 65–99)
POTASSIUM: 4 mmol/L (ref 3.5–5.1)
SODIUM: 138 mmol/L (ref 135–145)
Total Bilirubin: 0.6 mg/dL (ref 0.3–1.2)
Total Protein: 6.5 g/dL (ref 6.5–8.1)

## 2015-01-25 LAB — SURGICAL PCR SCREEN
MRSA, PCR: NEGATIVE
Staphylococcus aureus: NEGATIVE

## 2015-01-25 LAB — PROTIME-INR
INR: 7.41 (ref 0.00–1.49)
PROTHROMBIN TIME: 60.4 s — AB (ref 11.6–15.2)

## 2015-01-25 LAB — APTT: APTT: 96 s — AB (ref 24–37)

## 2015-01-25 LAB — ABO/RH: ABO/RH(D): B POS

## 2015-01-25 NOTE — Progress Notes (Signed)
Lab called a critical INR of 7.41, I called this to Jenetta Loges, PA for Dr Onnie Graham.

## 2015-01-25 NOTE — Telephone Encounter (Signed)
Will forward to Dr McLean 

## 2015-01-25 NOTE — Telephone Encounter (Signed)
No history of CVA, may hold coumadin without Lovenox bridge.

## 2015-01-25 NOTE — Pre-Procedure Instructions (Signed)
    [name]  01/25/2015      [facility] - [place], [place] [address] Phone: [phone] Fax: [phone]  [facility], [place] [address] Phone: [phone] Fax: [phone]  [id number] [place], [place] [address] Phone: [phone] Fax: [phone]    Your procedure is scheduled on Thursday 01/30/729.  Report to [facility] Admitting at  5:30 A.M.  Call this number if you have problems the morning of surgery:  [phone]   Remember:  Do not eat food or drink liquids after midnight.  Take these medicines the morning of surgery with A SIP OF WATER  Amniodarone, Wellbutrin, cardizem, cymbalta  Discontinue taking aspirin products, ibuprofen or naproxen containing products , vitamins or herbal supplements and fish oils 5 days prior to surgery. Contact your cardiologist regarding stopping your Coumadin.   Do not wear jewelry, make-up or nail polish.  Do not wear lotions, powders, or perfumes.  You may wear deodorant.  Do not shave 48 hours prior to surgery.    Do not bring valuables to the hospital.  [facility] is not responsible for any belongings or valuables.  Contacts, dentures or bridgework may not be worn into surgery.  Leave your suitcase in the car.  After surgery it may be brought to your room.  For patients admitted to the hospital, discharge time will be determined by your treatment team.  Patients discharged the day of surgery will not be allowed to drive home.    Please read over the following fact sheets that you were given. Pain Booklet, Coughing and Deep Breathing, Blood Transfusion Information, MRSA Information and Surgical Site Infection Prevention

## 2015-01-25 NOTE — Progress Notes (Signed)
Message left with [name] at Dr Susie Cassette office re: contacting Dr Aundra Dubin and coordination of Coumadin dosing pre-op. Patient had contacted Dr Claris Gladden office regarding her upcoming surgery and was instructed to contact the surgeon. Pt is awaiting instructions regarding the holding of her Coumadin.

## 2015-01-25 NOTE — Telephone Encounter (Signed)
New message    Office calling patient had falling an broke shoulder in 3 place .  Office faxed form  on yesterday.    Request for surgical clearance:  1. What type of surgery is being performed? Right reverse shoulder autoplasty   2. When is this surgery scheduled? 11.10.2016   Are there any medications that need to be held prior to surgery and how long? Coumadin  / advise on Lovenox bridge - need an answer today   3. Name of physician performing surgery? Dr. Onnie Graham   4. What is your office phone and fax number? [phone]- fax / phone # [phone]

## 2015-01-28 ENCOUNTER — Telehealth: Payer: Self-pay | Admitting: Cardiology

## 2015-01-28 ENCOUNTER — Encounter (HOSPITAL_COMMUNITY): Payer: Self-pay

## 2015-01-28 NOTE — Telephone Encounter (Signed)
I will forward to [facility] and Dr Onnie Graham.

## 2015-01-28 NOTE — Telephone Encounter (Signed)
Per [name] --Dr Onnie Graham will need to contact Dr Worthy Flank office for recommendations about coumadin/PT follow up. [name]  at [facility] advised she will need to contact Dr Worthy Flank office to follow up on coumadin/PT.

## 2015-01-28 NOTE — Progress Notes (Addendum)
Anesthesia Chart Review: Patient is a 69 year old female scheduled for right shoulder reverse arthroplasty on 01/31/15 by Dr. Onnie Graham. DX: Right proximal humerus fracture. She fell recently while visiting her son in [place] and required hospitalization in [place]. Also required facial sutures.  History includes former smoker, dyslipidemia, depression, non-Hodgkin's lymphoma s/p chemoradiation '05, retroperitoneal fibrosis with chronic left hydronephrosis s/p left ureteral stent, CAD by cardiac CT 04/2009, afib, Lyme disease, LLE DVT '05 and '06 (on warfarin), GERD, anxiety, bilateral TKA '10. PCP is Dr. Carolann Littler.  HEM-ONC is Dr. Curt Bears.   Cardiologist is Dr. Loralie Champagne. He last saw her on 12/20/14. She was back in afib (known PAF history). He started her on amiodarone to see if she would cardiovert, and if fails would "stop amiodarone and just follow rate control/anticoagulation strategy as she is not particularly symptomatic in atrial fibrillation." However, he also discussed consideration of DCCV, but did she not want to pursue this until after her vacation in [place] where she unfortunately fell and sustained a humerus fracture. Dr. Susie Cassette office did contact Dr. Aundra Dubin for surgery clearance. He did give permission to hold Lovenox without a Lovenox bridge (see telephone encounter 01/25/15).  Meds include Xanax, amiodarone, Wellbutrin XL, Cardizem, Cymbalta, Flonase, Lasix, Claritin, losartan, nature-thyroid, Nitro, oxycodone, pitavastatin, warfarin, pregnenolone micronized.   01/25/15 EKG: Afib with competing junctional pacemaker. Rate 71 bpm. Afib is new when compared to last EKG in [facility] from 11/13/08, but NOT NEW when compared to prior EKG tracings done at [facility], last 12/20/14.   11/15/13 Echo: Low normal left ventricular systolic function with abnormal relaxation and elevated filling pressures. Moderately thickenedand calcified mitral valve leaflets with significant posteriorMAC.  Mild mitral stenosis and trace mitral regurgitation. Normal rigt ventricular function. Normal RVSP. (Reviewed by Dr. Aundra Dubin, who wrote, "EF 50-55%. Mild mitral stenosis in setting of significant valve calcification. Will follow over time. No evidence for pulmonary hypertension and normal RV."  According to Dr. Claris Gladden notes, "A Leane Call was done in 1/11. This was an equivocal study due to subdiaphragmatic nuclear uptake. EF was calculated to be 49% with global hypokinesis. Given the equivocal myoview, I had her do a coronary CT angiogram. This showed a moderate calcium score of 161 (putting her at a high risk percentile for her age) and a possible moderate mid LAD stenosis (though some degradation of images by respiratory artifact). Echo was done to reassess LV systolic function (reported as mildly decreased on myoview). This confirmed mild LV systolic dysfunction, EF 15-61% with moderate diastolic dysfunction. We discussed catheterization, but patient strongly preferred medical treatment." (Full test reports can be viewed in Epic.)   10/04/14 PFTs: FVC 2.98 (94%), FEV1 2.09 (87%), DLCO 19.74 (75%).  Minimal obstructive airways disease. Insignificant response to BD. Minimal diffusion defect. Conclusions: Although there is airway obstruction and a diffusion defect suggesting emphysema, the absence of overinflation is inconsistent with that diagnosis.   Preoperative labs noted. H/H 10.7/33.3. Cr 1.06. Glucose 113. INR 7.41 and PTT 96. T&S done. Coag results already called to Jenetta Loges, PA-C with Dr. Saundra Shelling on 01/25/15. Patient has already been instructed to hold warfarin. Their office has now been in touch with Dr. Claris Gladden office regarding "where they stand going forward." Apparently, patient's INR is monitored at  Dr. Worthy Flank office, so will need to see if her PT/PTT is rechecked prior to 01/31/15. Her PT/PTT will need to be in an acceptable range prior to proceeding with surgery. Will go a  head and enter  a repeat PT/PTT order--which will need to be done pre-operatively if not already rechecked and WNL before her surgery date.    Patient with known afib, rate controlled. Dr. Aundra Dubin is aware of surgery plans. He last saw her within the past two months. INR was supratherapeutic on Friday, and is now on hold. Wiill need to be in acceptable range prior to surgery.   George Hugh [facility] Short Stay Center/Anesthesiology Phone [phone] 01/28/2015 11:02 AM  Addendum: I spoke with Olivia Mackie this morning. Dr. Aundra Dubin had deferred warfarin follow-up to Dr. Julien Nordmann since that is where she normally gets her INRs checked. However, notes indicate that the [facility] "shut down." Dr. Worthy Flank staff advised return to her PCP for management. Since INR had not been rechecked, Olivia Mackie ordered a PT/INR today and had patient go to [facility] for lab draw. She said that if results were 1.8 or less then they would plan to move forward with surgery. Results showed PT 18.9, INR 1.58. PTT was not checked. Case is posted for an interscalene block, so I think PT/PTT will have to be repeated on the day of surgery to ensure results are acceptable to perform a block, but based on today's INR results it seems she would be able to proceed with surgery.   George Hugh [facility] Short Stay Center/Anesthesiology Phone [phone] 01/30/2015 12:59 PM

## 2015-01-28 NOTE — Telephone Encounter (Signed)
Pt has INR checked at Dr [facility] office.

## 2015-01-28 NOTE — Telephone Encounter (Signed)
New Message    Office calling stating that their office received a call from the hospital on Friday when pt was there for her pre op for total knee replacement and pt's INR was 7. Pt has now been told to stop Coumadin but the office is calling for clarification on where they stand going forward. Please call back and advise.

## 2015-01-29 ENCOUNTER — Telehealth: Payer: Self-pay | Admitting: Cardiology

## 2015-01-29 ENCOUNTER — Telehealth: Payer: Self-pay | Admitting: *Deleted

## 2015-01-29 NOTE — Telephone Encounter (Signed)
Resume coumadin one day after surgery, She can receive advise from her surgeon or the physician who Rx her coumadin

## 2015-01-29 NOTE — Telephone Encounter (Signed)
VM message from patient regarding upcoming orthopedic surgery and pt's coumadin -see previous note.  Pt's INR was 7 and coumadin is on hold but pt wants to know what next step will be after she has surgery on 110/10/16

## 2015-01-30 ENCOUNTER — Other Ambulatory Visit (HOSPITAL_COMMUNITY)
Admission: RE | Admit: 2015-01-30 | Discharge: 2015-01-30 | Disposition: A | Discharge status: Home / Self Care | Payer: Medicare Other | Source: Ambulatory Visit | Attending: Orthopedic Surgery | Admitting: Orthopedic Surgery

## 2015-01-30 DIAGNOSIS — Z01812 Encounter for preprocedural laboratory examination: Secondary | ICD-10-CM | POA: Diagnosis not present

## 2015-01-30 DIAGNOSIS — Z7901 Long term (current) use of anticoagulants: Secondary | ICD-10-CM | POA: Diagnosis not present

## 2015-01-30 LAB — PROTIME-INR
INR: 1.58 — ABNORMAL HIGH (ref 0.00–1.49)
PROTHROMBIN TIME: 18.9 s — AB (ref 11.6–15.2)

## 2015-01-30 MED ORDER — VANCOMYCIN HCL 10 G IV SOLR
1500.0000 mg | INTRAVENOUS | Status: DC
  Filled 2015-01-30: qty 1500

## 2015-01-30 NOTE — Telephone Encounter (Signed)
Notified pt per MD resume coumadin one day after surgery, get advice from surgeon or physician who is rx coumadin. Pt advised shw was seeing coumadin clinic but they " shut down"  I though Dr. Julien Nordmann would be the Dr who gives it to me. Discussed with pt many pts have returned to their PCP for Coumadin management. Pt states she will try to  Do this but they ( PCP office ) are horrible at calling her back and Why did the coumadin clinic close. Discussed with pt I did not have any information as to why clinic closed. Pt verbalized understanding. No further concerns.

## 2015-01-31 ENCOUNTER — Encounter (HOSPITAL_COMMUNITY)
Admission: RE | Disposition: A | Discharge status: Skilled Nursing Facility | Payer: Self-pay | Source: Ambulatory Visit | Attending: Orthopedic Surgery

## 2015-01-31 ENCOUNTER — Inpatient Hospital Stay (HOSPITAL_COMMUNITY)
Admission: RE | Admit: 2015-01-31 | Discharge: 2015-02-02 | DRG: 483 | Disposition: A | Discharge status: Skilled Nursing Facility | Payer: Medicare Other | Source: Ambulatory Visit | Attending: Orthopedic Surgery | Admitting: Orthopedic Surgery

## 2015-01-31 ENCOUNTER — Inpatient Hospital Stay (HOSPITAL_COMMUNITY): Payer: Medicare Other | Admitting: Emergency Medicine

## 2015-01-31 ENCOUNTER — Inpatient Hospital Stay (HOSPITAL_COMMUNITY): Payer: Medicare Other | Admitting: Certified Registered Nurse Anesthetist

## 2015-01-31 ENCOUNTER — Encounter (HOSPITAL_COMMUNITY): Payer: Self-pay | Admitting: *Deleted

## 2015-01-31 DIAGNOSIS — E785 Hyperlipidemia, unspecified: Secondary | ICD-10-CM | POA: Diagnosis not present

## 2015-01-31 DIAGNOSIS — T8859XA Other complications of anesthesia, initial encounter: Secondary | ICD-10-CM

## 2015-01-31 DIAGNOSIS — S42291A Other displaced fracture of upper end of right humerus, initial encounter for closed fracture: Secondary | ICD-10-CM | POA: Diagnosis not present

## 2015-01-31 DIAGNOSIS — Z86718 Personal history of other venous thrombosis and embolism: Secondary | ICD-10-CM | POA: Diagnosis not present

## 2015-01-31 DIAGNOSIS — S42201A Unspecified fracture of upper end of right humerus, initial encounter for closed fracture: Principal | ICD-10-CM | POA: Diagnosis present

## 2015-01-31 DIAGNOSIS — M1990 Unspecified osteoarthritis, unspecified site: Secondary | ICD-10-CM | POA: Diagnosis not present

## 2015-01-31 DIAGNOSIS — I2510 Atherosclerotic heart disease of native coronary artery without angina pectoris: Secondary | ICD-10-CM | POA: Diagnosis present

## 2015-01-31 DIAGNOSIS — I4891 Unspecified atrial fibrillation: Secondary | ICD-10-CM | POA: Diagnosis not present

## 2015-01-31 DIAGNOSIS — Z7901 Long term (current) use of anticoagulants: Secondary | ICD-10-CM | POA: Diagnosis not present

## 2015-01-31 DIAGNOSIS — Z96619 Presence of unspecified artificial shoulder joint: Secondary | ICD-10-CM

## 2015-01-31 DIAGNOSIS — W19XXXA Unspecified fall, initial encounter: Secondary | ICD-10-CM | POA: Diagnosis present

## 2015-01-31 DIAGNOSIS — F419 Anxiety disorder, unspecified: Secondary | ICD-10-CM | POA: Diagnosis present

## 2015-01-31 DIAGNOSIS — K219 Gastro-esophageal reflux disease without esophagitis: Secondary | ICD-10-CM | POA: Diagnosis present

## 2015-01-31 DIAGNOSIS — Z96611 Presence of right artificial shoulder joint: Secondary | ICD-10-CM

## 2015-01-31 DIAGNOSIS — Z87891 Personal history of nicotine dependence: Secondary | ICD-10-CM

## 2015-01-31 DIAGNOSIS — Z8572 Personal history of non-Hodgkin lymphomas: Secondary | ICD-10-CM | POA: Diagnosis not present

## 2015-01-31 DIAGNOSIS — G8918 Other acute postprocedural pain: Secondary | ICD-10-CM | POA: Diagnosis not present

## 2015-01-31 HISTORY — DX: Other complications of anesthesia, initial encounter: T88.59XA

## 2015-01-31 HISTORY — PX: REVERSE SHOULDER ARTHROPLASTY: SHX5054

## 2015-01-31 LAB — PROTIME-INR
INR: 1.41 (ref 0.00–1.49)
PROTHROMBIN TIME: 17.4 s — AB (ref 11.6–15.2)

## 2015-01-31 LAB — PREPARE RBC (CROSSMATCH)

## 2015-01-31 LAB — APTT: APTT: 30 s (ref 24–37)

## 2015-01-31 SURGERY — ARTHROPLASTY, SHOULDER, TOTAL, REVERSE
Anesthesia: General | Site: Shoulder | Laterality: Right

## 2015-01-31 MED ORDER — GLYCOPYRROLATE 0.2 MG/ML IJ SOLN
INTRAMUSCULAR | Status: DC | PRN
  Administered 2015-01-31: 0.6 mg via INTRAVENOUS

## 2015-01-31 MED ORDER — POLYETHYLENE GLYCOL 3350 17 G PO PACK: 17.0000 g | PACK | Freq: Every day | ORAL | Status: DC | PRN

## 2015-01-31 MED ORDER — THYROID 113.75 MG PO TABS: 1.0000 | ORAL_TABLET | Freq: Every day | ORAL | Status: DC

## 2015-01-31 MED ORDER — MIDAZOLAM HCL 5 MG/5ML IJ SOLN
INTRAMUSCULAR | Status: DC | PRN
  Administered 2015-01-31 (×2): 1 mg via INTRAVENOUS

## 2015-01-31 MED ORDER — ONDANSETRON HCL 4 MG/2ML IJ SOLN
INTRAMUSCULAR | Status: DC | PRN
  Administered 2015-01-31: 4 mg via INTRAVENOUS

## 2015-01-31 MED ORDER — ARTIFICIAL TEARS OP OINT
TOPICAL_OINTMENT | OPHTHALMIC | Status: AC
  Filled 2015-01-31: qty 3.5

## 2015-01-31 MED ORDER — DEXTROSE 5 % IV SOLN
500.0000 mg | Freq: Four times a day (QID) | INTRAVENOUS | Status: DC | PRN
  Filled 2015-01-31: qty 5

## 2015-01-31 MED ORDER — DILTIAZEM HCL ER COATED BEADS 180 MG PO CP24
180.0000 mg | ORAL_CAPSULE | Freq: Two times a day (BID) | ORAL | Status: DC
  Administered 2015-01-31 – 2015-02-01 (×3): 180 mg via ORAL
  Filled 2015-01-31 (×3): qty 1

## 2015-01-31 MED ORDER — TRANEXAMIC ACID 1000 MG/10ML IV SOLN
2000.0000 mg | INTRAVENOUS | Status: DC
  Filled 2015-01-31: qty 20

## 2015-01-31 MED ORDER — FENTANYL CITRATE (PF) 250 MCG/5ML IJ SOLN
INTRAMUSCULAR | Status: AC
  Filled 2015-01-31: qty 5

## 2015-01-31 MED ORDER — ALPRAZOLAM 0.5 MG PO TABS
1.0000 mg | ORAL_TABLET | Freq: Three times a day (TID) | ORAL | Status: DC
  Administered 2015-02-01 (×2): 1 mg via ORAL
  Filled 2015-01-31 (×2): qty 2

## 2015-01-31 MED ORDER — FLUTICASONE PROPIONATE 50 MCG/ACT NA SUSP
1.0000 | Freq: Every day | NASAL | Status: DC
  Administered 2015-02-01: 1 via NASAL
  Filled 2015-01-31: qty 16

## 2015-01-31 MED ORDER — NEOSTIGMINE METHYLSULFATE 10 MG/10ML IV SOLN
INTRAVENOUS | Status: AC
  Filled 2015-01-31: qty 1

## 2015-01-31 MED ORDER — INFLUENZA VAC SPLIT QUAD 0.5 ML IM SUSY: 0.5000 mL | PREFILLED_SYRINGE | INTRAMUSCULAR | Status: DC

## 2015-01-31 MED ORDER — LIDOCAINE HCL (CARDIAC) 20 MG/ML IV SOLN
INTRAVENOUS | Status: DC | PRN
  Administered 2015-01-31: 40 mg via INTRAVENOUS

## 2015-01-31 MED ORDER — LOSARTAN POTASSIUM 25 MG PO TABS
25.0000 mg | ORAL_TABLET | Freq: Every day | ORAL | Status: DC
  Administered 2015-02-01: 25 mg via ORAL
  Filled 2015-01-31: qty 1

## 2015-01-31 MED ORDER — METOCLOPRAMIDE HCL 5 MG PO TABS: 5.0000 mg | ORAL_TABLET | Freq: Three times a day (TID) | ORAL | Status: DC | PRN

## 2015-01-31 MED ORDER — THYROID 120 MG PO TABS
113.7500 mg | ORAL_TABLET | Freq: Every day | ORAL | Status: DC
  Administered 2015-02-01 – 2015-02-02 (×2): 120 mg via ORAL
  Filled 2015-01-31 (×3): qty 1

## 2015-01-31 MED ORDER — CEFAZOLIN SODIUM-DEXTROSE 2-3 GM-% IV SOLR
2.0000 g | Freq: Four times a day (QID) | INTRAVENOUS | Status: AC
  Administered 2015-01-31 – 2015-02-01 (×3): 2 g via INTRAVENOUS
  Filled 2015-01-31 (×3): qty 50

## 2015-01-31 MED ORDER — FENTANYL CITRATE (PF) 100 MCG/2ML IJ SOLN
INTRAMUSCULAR | Status: DC | PRN
  Administered 2015-01-31 (×3): 50 ug via INTRAVENOUS

## 2015-01-31 MED ORDER — METOCLOPRAMIDE HCL 5 MG/ML IJ SOLN: 5.0000 mg | Freq: Three times a day (TID) | INTRAMUSCULAR | Status: DC | PRN

## 2015-01-31 MED ORDER — CEFAZOLIN SODIUM-DEXTROSE 2-3 GM-% IV SOLR
INTRAVENOUS | Status: AC
  Administered 2015-01-31: 2 g via INTRAVENOUS
  Filled 2015-01-31: qty 50

## 2015-01-31 MED ORDER — LACTATED RINGERS IV SOLN
INTRAVENOUS | Status: DC
  Administered 2015-01-31: 21:00:00 via INTRAVENOUS

## 2015-01-31 MED ORDER — METHOCARBAMOL 500 MG PO TABS
500.0000 mg | ORAL_TABLET | Freq: Four times a day (QID) | ORAL | Status: DC | PRN
  Administered 2015-02-01 (×2): 500 mg via ORAL
  Filled 2015-01-31 (×2): qty 1

## 2015-01-31 MED ORDER — PHENYLEPHRINE HCL 10 MG/ML IJ SOLN
10.0000 mg | INTRAVENOUS | Status: DC | PRN
  Administered 2015-01-31: 10 ug/min via INTRAVENOUS

## 2015-01-31 MED ORDER — PRAVASTATIN SODIUM 40 MG PO TABS
40.0000 mg | ORAL_TABLET | Freq: Every day | ORAL | Status: DC
  Administered 2015-01-31 – 2015-02-01 (×2): 40 mg via ORAL
  Filled 2015-01-31 (×2): qty 1

## 2015-01-31 MED ORDER — ROCURONIUM BROMIDE 50 MG/5ML IV SOLN
INTRAVENOUS | Status: AC
  Filled 2015-01-31: qty 1

## 2015-01-31 MED ORDER — MIDAZOLAM HCL 2 MG/2ML IJ SOLN
INTRAMUSCULAR | Status: AC
  Filled 2015-01-31: qty 4

## 2015-01-31 MED ORDER — ONDANSETRON HCL 4 MG/2ML IJ SOLN: 4.0000 mg | Freq: Once | INTRAMUSCULAR | Status: DC | PRN

## 2015-01-31 MED ORDER — NEOSTIGMINE METHYLSULFATE 10 MG/10ML IV SOLN
INTRAVENOUS | Status: DC | PRN
  Administered 2015-01-31: 4 mg via INTRAVENOUS

## 2015-01-31 MED ORDER — LACTATED RINGERS IV SOLN
INTRAVENOUS | Status: DC
  Administered 2015-01-31 (×2): via INTRAVENOUS

## 2015-01-31 MED ORDER — ONDANSETRON HCL 4 MG/2ML IJ SOLN
INTRAMUSCULAR | Status: AC
  Filled 2015-01-31: qty 2

## 2015-01-31 MED ORDER — MENTHOL 3 MG MT LOZG: 1.0000 | LOZENGE | OROMUCOSAL | Status: DC | PRN

## 2015-01-31 MED ORDER — SODIUM CHLORIDE 0.9 % IJ SOLN
INTRAMUSCULAR | Status: AC
  Filled 2015-01-31: qty 10

## 2015-01-31 MED ORDER — EPHEDRINE SULFATE 50 MG/ML IJ SOLN
INTRAMUSCULAR | Status: AC
  Filled 2015-01-31: qty 1

## 2015-01-31 MED ORDER — PROPOFOL 10 MG/ML IV BOLUS
INTRAVENOUS | Status: AC
  Filled 2015-01-31: qty 20

## 2015-01-31 MED ORDER — ONDANSETRON HCL 4 MG PO TABS
4.0000 mg | ORAL_TABLET | Freq: Four times a day (QID) | ORAL | Status: DC | PRN
  Administered 2015-02-01: 4 mg via ORAL
  Filled 2015-01-31: qty 1

## 2015-01-31 MED ORDER — DIPHENHYDRAMINE HCL 12.5 MG/5ML PO ELIX: 12.5000 mg | ORAL_SOLUTION | ORAL | Status: DC | PRN

## 2015-01-31 MED ORDER — LORATADINE 10 MG PO TABS
10.0000 mg | ORAL_TABLET | Freq: Every day | ORAL | Status: DC
  Filled 2015-01-31: qty 1

## 2015-01-31 MED ORDER — NITROGLYCERIN 0.4 MG SL SUBL: 0.4000 mg | SUBLINGUAL_TABLET | SUBLINGUAL | Status: DC | PRN

## 2015-01-31 MED ORDER — WARFARIN SODIUM 5 MG PO TABS
5.0000 mg | ORAL_TABLET | Freq: Every day | ORAL | Status: DC
  Administered 2015-01-31 – 2015-02-01 (×2): 5 mg via ORAL
  Filled 2015-01-31 (×2): qty 1

## 2015-01-31 MED ORDER — PROPOFOL 10 MG/ML IV BOLUS
INTRAVENOUS | Status: DC | PRN
  Administered 2015-01-31: 130 mg via INTRAVENOUS

## 2015-01-31 MED ORDER — ARTIFICIAL TEARS OP OINT
TOPICAL_OINTMENT | OPHTHALMIC | Status: DC | PRN
  Administered 2015-01-31: 1 via OPHTHALMIC

## 2015-01-31 MED ORDER — HYDROMORPHONE HCL 1 MG/ML IJ SOLN
1.0000 mg | INTRAMUSCULAR | Status: DC | PRN
  Administered 2015-01-31: 1 mg via INTRAVENOUS
  Filled 2015-01-31: qty 1

## 2015-01-31 MED ORDER — ONDANSETRON HCL 4 MG/2ML IJ SOLN: 4.0000 mg | Freq: Four times a day (QID) | INTRAMUSCULAR | Status: DC | PRN

## 2015-01-31 MED ORDER — BUPROPION HCL ER (XL) 150 MG PO TB24
300.0000 mg | ORAL_TABLET | Freq: Every day | ORAL | Status: DC
  Administered 2015-02-01: 300 mg via ORAL
  Filled 2015-01-31: qty 2

## 2015-01-31 MED ORDER — OXYCODONE HCL 5 MG PO TABS
5.0000 mg | ORAL_TABLET | ORAL | Status: DC | PRN
  Administered 2015-01-31 – 2015-02-02 (×8): 10 mg via ORAL
  Filled 2015-01-31: qty 2
  Filled 2015-01-31: qty 1
  Filled 2015-01-31 (×4): qty 2
  Filled 2015-01-31: qty 1
  Filled 2015-01-31 (×2): qty 2

## 2015-01-31 MED ORDER — DOCUSATE SODIUM 100 MG PO CAPS
100.0000 mg | ORAL_CAPSULE | Freq: Two times a day (BID) | ORAL | Status: DC
  Administered 2015-01-31 – 2015-02-01 (×4): 100 mg via ORAL
  Filled 2015-01-31 (×4): qty 1

## 2015-01-31 MED ORDER — PHENYLEPHRINE 40 MCG/ML (10ML) SYRINGE FOR IV PUSH (FOR BLOOD PRESSURE SUPPORT)
PREFILLED_SYRINGE | INTRAVENOUS | Status: AC
  Filled 2015-01-31: qty 10

## 2015-01-31 MED ORDER — ACETAMINOPHEN 325 MG PO TABS
650.0000 mg | ORAL_TABLET | Freq: Four times a day (QID) | ORAL | Status: DC | PRN
Start: 2015-01-31 — End: 2015-02-02

## 2015-01-31 MED ORDER — AMIODARONE HCL 100 MG PO TABS
200.0000 mg | ORAL_TABLET | Freq: Every day | ORAL | Status: DC
  Administered 2015-02-01: 200 mg via ORAL
  Filled 2015-01-31: qty 2

## 2015-01-31 MED ORDER — MAGNESIUM CITRATE PO SOLN: 1.0000 | Freq: Once | ORAL | Status: DC | PRN

## 2015-01-31 MED ORDER — BISACODYL 5 MG PO TBEC: 5.0000 mg | DELAYED_RELEASE_TABLET | Freq: Every day | ORAL | Status: DC | PRN

## 2015-01-31 MED ORDER — FUROSEMIDE 20 MG PO TABS
20.0000 mg | ORAL_TABLET | Freq: Every day | ORAL | Status: DC
  Administered 2015-01-31 – 2015-02-01 (×2): 20 mg via ORAL
  Filled 2015-01-31 (×2): qty 1

## 2015-01-31 MED ORDER — WARFARIN - PHYSICIAN DOSING INPATIENT
Freq: Every day | Status: DC
  Administered 2015-02-01: 17:00:00

## 2015-01-31 MED ORDER — HYDROMORPHONE HCL 1 MG/ML IJ SOLN: 0.2500 mg | INTRAMUSCULAR | Status: DC | PRN

## 2015-01-31 MED ORDER — LIDOCAINE HCL (CARDIAC) 20 MG/ML IV SOLN
INTRAVENOUS | Status: AC
  Filled 2015-01-31: qty 5

## 2015-01-31 MED ORDER — ROCURONIUM BROMIDE 100 MG/10ML IV SOLN
INTRAVENOUS | Status: DC | PRN
  Administered 2015-01-31: 40 mg via INTRAVENOUS

## 2015-01-31 MED ORDER — SODIUM CHLORIDE 0.9 % IR SOLN
Status: DC | PRN
  Administered 2015-01-31: 1000 mL

## 2015-01-31 MED ORDER — PHENOL 1.4 % MT LIQD: 1.0000 | OROMUCOSAL | Status: DC | PRN

## 2015-01-31 MED ORDER — GLYCOPYRROLATE 0.2 MG/ML IJ SOLN
INTRAMUSCULAR | Status: AC
  Filled 2015-01-31: qty 3

## 2015-01-31 MED ORDER — PHENYLEPHRINE HCL 10 MG/ML IJ SOLN
INTRAMUSCULAR | Status: DC | PRN
  Administered 2015-01-31: 40 ug via INTRAVENOUS
  Administered 2015-01-31 (×2): 80 ug via INTRAVENOUS
  Administered 2015-01-31: 40 ug via INTRAVENOUS

## 2015-01-31 MED ORDER — NITROGLYCERIN 0.3 MG SL SUBL
0.3000 mg | SUBLINGUAL_TABLET | SUBLINGUAL | Status: DC | PRN
  Filled 2015-01-31: qty 100

## 2015-01-31 MED ORDER — DULOXETINE HCL 60 MG PO CPEP
60.0000 mg | ORAL_CAPSULE | Freq: Every day | ORAL | Status: DC
  Administered 2015-02-01: 60 mg via ORAL
  Filled 2015-01-31: qty 1

## 2015-01-31 MED ORDER — SODIUM CHLORIDE 0.9 % IV SOLN: Freq: Once | INTRAVENOUS | Status: DC

## 2015-01-31 MED ORDER — ACETAMINOPHEN 650 MG RE SUPP: 650.0000 mg | Freq: Four times a day (QID) | RECTAL | Status: DC | PRN

## 2015-01-31 MED ORDER — TRANEXAMIC ACID 1000 MG/10ML IV SOLN
2000.0000 mg | INTRAVENOUS | Status: DC | PRN
  Administered 2015-01-31: 2000 mg via INTRAVENOUS

## 2015-01-31 MED ORDER — SUCCINYLCHOLINE CHLORIDE 20 MG/ML IJ SOLN
INTRAMUSCULAR | Status: AC
  Filled 2015-01-31: qty 1

## 2015-01-31 SURGICAL SUPPLY — 79 items
ADH SKN CLS APL DERMABOND .7 (GAUZE/BANDAGES/DRESSINGS) ×1
AID PSTN UNV HD RSTRNT DISP (MISCELLANEOUS) ×1
BIT DRILL 170X2.5X (BIT) IMPLANT
BIT DRL 170X2.5X (BIT) ×1
BLADE SAW SGTL 83.5X18.5 (BLADE) ×2 IMPLANT
BRUSH FEMORAL CANAL (MISCELLANEOUS) IMPLANT
CAPT SHLDR REVTOTAL 1 ×1 IMPLANT
CEMENT BONE DEPUY (Cement) ×2 IMPLANT
COVER SURGICAL LIGHT HANDLE (MISCELLANEOUS) ×2 IMPLANT
DERMABOND ADVANCED (GAUZE/BANDAGES/DRESSINGS) ×1
DERMABOND ADVANCED .7 DNX12 (GAUZE/BANDAGES/DRESSINGS) ×1 IMPLANT
DRAPE ORTHO SPLIT 77X108 STRL (DRAPES) ×4
DRAPE SURG 17X11 SM STRL (DRAPES) ×2 IMPLANT
DRAPE SURG ORHT 6 SPLT 77X108 (DRAPES) ×2 IMPLANT
DRAPE U-SHAPE 47X51 STRL (DRAPES) ×2 IMPLANT
DRILL 2.5 (BIT) ×2
DRILL BIT 7/64X5 (BIT) ×2 IMPLANT
DRSG AQUACEL AG ADV 3.5X10 (GAUZE/BANDAGES/DRESSINGS) ×2 IMPLANT
DRSG MEPILEX BORDER 4X8 (GAUZE/BANDAGES/DRESSINGS) IMPLANT
DURAPREP 26ML APPLICATOR (WOUND CARE) ×2 IMPLANT
ELECT BLADE 4.0 EZ CLEAN MEGAD (MISCELLANEOUS) ×4
ELECT CAUTERY BLADE 6.4 (BLADE) ×2 IMPLANT
ELECT REM PT RETURN 9FT ADLT (ELECTROSURGICAL) ×2
ELECTRODE BLDE 4.0 EZ CLN MEGD (MISCELLANEOUS) ×1 IMPLANT
ELECTRODE REM PT RTRN 9FT ADLT (ELECTROSURGICAL) ×1 IMPLANT
FACESHIELD WRAPAROUND (MASK) ×6 IMPLANT
FACESHIELD WRAPAROUND OR TEAM (MASK) ×3 IMPLANT
GLOVE BIO SURGEON STRL SZ 6.5 (GLOVE) ×2 IMPLANT
GLOVE BIO SURGEON STRL SZ7.5 (GLOVE) ×2 IMPLANT
GLOVE BIO SURGEON STRL SZ8 (GLOVE) ×2 IMPLANT
GLOVE EUDERMIC 7 POWDERFREE (GLOVE) ×2 IMPLANT
GLOVE SS BIOGEL STRL SZ 7.5 (GLOVE) ×1 IMPLANT
GLOVE SUPERSENSE BIOGEL SZ 7.5 (GLOVE) ×1
GOWN STRL REUS W/ TWL LRG LVL3 (GOWN DISPOSABLE) IMPLANT
GOWN STRL REUS W/ TWL XL LVL3 (GOWN DISPOSABLE) ×2 IMPLANT
GOWN STRL REUS W/TWL LRG LVL3 (GOWN DISPOSABLE)
GOWN STRL REUS W/TWL XL LVL3 (GOWN DISPOSABLE) ×4
HANDPIECE INTERPULSE COAX TIP (DISPOSABLE)
KIT BASIN OR (CUSTOM PROCEDURE TRAY) ×2 IMPLANT
KIT ROOM TURNOVER OR (KITS) ×2 IMPLANT
MANIFOLD NEPTUNE II (INSTRUMENTS) ×2 IMPLANT
NDL SUT 6 .5 CRC .975X.05 MAYO (NEEDLE) IMPLANT
NEEDLE HYPO 25GX1X1/2 BEV (NEEDLE) IMPLANT
NEEDLE MAYO TAPER (NEEDLE)
NEEDLE MAYO TROCAR (NEEDLE) ×2 IMPLANT
NS IRRIG 1000ML POUR BTL (IV SOLUTION) ×2 IMPLANT
PACK SHOULDER (CUSTOM PROCEDURE TRAY) ×2 IMPLANT
PAD ARMBOARD 7.5X6 YLW CONV (MISCELLANEOUS) ×4 IMPLANT
PASSER SUT SWANSON 36MM LOOP (INSTRUMENTS) IMPLANT
PIN GUIDE 1.2 (PIN) ×1 IMPLANT
PIN GUIDE GLENOPHERE 1.5MX300M (PIN) ×2 IMPLANT
PIN METAGLENE 2.5 (PIN) ×1 IMPLANT
PRESSURIZER FEMORAL UNIV (MISCELLANEOUS) IMPLANT
RESTRAINT HEAD UNIVERSAL NS (MISCELLANEOUS) ×2 IMPLANT
RESTRICTOR CEMENT PE SZ 2 (Cement) ×1 IMPLANT
SET HNDPC FAN SPRY TIP SCT (DISPOSABLE) IMPLANT
SLING ARM FOAM STRAP LRG (SOFTGOODS) IMPLANT
SLING ARM IMMOBILIZER LRG (SOFTGOODS) ×1 IMPLANT
SPONGE LAP 18X18 X RAY DECT (DISPOSABLE) ×5 IMPLANT
SPONGE LAP 4X18 X RAY DECT (DISPOSABLE) ×1 IMPLANT
SUCTION FRAZIER TIP 10 FR DISP (SUCTIONS) ×2 IMPLANT
SUT BONE WAX W31G (SUTURE) IMPLANT
SUT FIBERWIRE #2 38 T-5 BLUE (SUTURE) ×14
SUT MNCRL AB 3-0 PS2 18 (SUTURE) ×3 IMPLANT
SUT MON AB 2-0 CT1 36 (SUTURE) ×2 IMPLANT
SUT VIC AB 1 CT1 27 (SUTURE) ×2
SUT VIC AB 1 CT1 27XBRD ANBCTR (SUTURE) ×1 IMPLANT
SUT VIC AB 2-0 CT1 27 (SUTURE) ×2
SUT VIC AB 2-0 CT1 TAPERPNT 27 (SUTURE) ×1 IMPLANT
SUT VIC AB 2-0 SH 27 (SUTURE)
SUT VIC AB 2-0 SH 27X BRD (SUTURE) IMPLANT
SUTURE FIBERWR #2 38 T-5 BLUE (SUTURE) ×2 IMPLANT
SYR 30ML SLIP (SYRINGE) ×2 IMPLANT
SYR CONTROL 10ML LL (SYRINGE) IMPLANT
TOWEL OR 17X24 6PK STRL BLUE (TOWEL DISPOSABLE) ×2 IMPLANT
TOWEL OR 17X26 10 PK STRL BLUE (TOWEL DISPOSABLE) ×2 IMPLANT
TOWER CARTRIDGE SMART MIX (DISPOSABLE) IMPLANT
TRAY FOLEY CATH 16FRSI W/METER (SET/KITS/TRAYS/PACK) IMPLANT
WATER STERILE IRR 1000ML POUR (IV SOLUTION) ×2 IMPLANT

## 2015-01-31 NOTE — Progress Notes (Signed)
Utilization review completed.  

## 2015-01-31 NOTE — Evaluation (Signed)
Physical Therapy Evaluation Patient Details Name: [name] MRN: [id number] DOB: 02/05/1946 Today's Date: 01/31/2015   History of Present Illness  69 y.o. female with a severely displaced right 3 part proximal humerus fracture. seem s/p right reverse shoulder arthroplasty.  Clinical Impression  Patient is s/p above procedure, presenting with functional limitations due to the deficits listed below (see PT Problem List). Demonstrates moderate instability requiring an assistive device for support and intermittent physical assist to correct loss of balance. Pt has been using a hemi-walker at home but demonstrates frequent tripping while using this device. May benefit more from a quad cane which we will trial tomorrow. States family can provide 24/7 assist. Would benefit from HHPT due to fall risk. Patient will benefit from skilled PT to increase their independence and safety with mobility to allow discharge to the venue listed below.       Follow Up Recommendations Home health PT;Supervision for mobility/OOB (supervision for all OOB activites)    Equipment Recommendations   (Possibly quad cane - TBD)    Recommendations for Other Services       Precautions / Restrictions Precautions Precautions: Shoulder Shoulder Interventions: Shoulder sling/immobilizer;Off for dressing/bathing/exercises Required Braces or Orthoses: Sling Restrictions Weight Bearing Restrictions: Yes RUE Weight Bearing: Non weight bearing      Mobility  Bed Mobility Overal bed mobility: Needs Assistance Bed Mobility: Supine to Sit;Sit to Supine     Supine to sit: Min guard;HOB elevated Sit to supine: Min assist   General bed mobility comments: close guard for safety, use of rail as needed with HOB elevated, performed towards Left side with cues for technique. Min assist for LE support back into bed.  Transfers Overall transfer level: Needs assistance Equipment used: Hemi-walker Transfers: Sit to/from  Stand Sit to Stand: Min guard         General transfer comment: min guard for safety. Vc for hand placement and technique. Mild sway upon standing but able to correct with UE support on hemi-walker  Ambulation/Gait Ambulation/Gait assistance: Min assist Ambulation Distance (Feet): 60 Feet Assistive device: Hemi-walker Gait Pattern/deviations: Step-to pattern;Step-through pattern;Decreased stride length;Shuffle;Staggering left;Staggering right;Leaning posteriorly;Trunk flexed;Narrow base of support Gait velocity: slow Gait velocity interpretation: <1.8 ft/sec, indicative of risk for recurrent falls General Gait Details: Educated on proper use of DME with a hemi-walker which she states she has been using at home. She frequently trips on the edge of this device and states this has been a problem at home. Cues for safety and to clear feet of hemi-walker. Pt also staggering at times needing min assist to prevent fall. Cues for upright posture.  Stairs            Wheelchair Mobility    Modified Rankin (Stroke Patients Only)       Balance Overall balance assessment: Needs assistance;History of Falls Sitting-balance support: No upper extremity supported;Feet supported Sitting balance-Leahy Scale: Good     Standing balance support: No upper extremity supported Standing balance-Leahy Scale: Fair                               Pertinent Vitals/Pain Pain Assessment: 0-10 Pain Score: 8  Pain Location: Rt shoulder "armpit" Pain Descriptors / Indicators: Throbbing Pain Intervention(s): Monitored during session;Repositioned;Limited activity within patient's tolerance;RN gave pain meds during session    [place] expects to be discharged to:: Private residence Living Arrangements: Spouse/significant other;Children (son) Available Help at Discharge: Family;Available 24 hours/day Type of  Home: House Home Access: Stairs to enter Entrance Stairs-Rails:  Right Entrance Stairs-Number of Steps: 2 Home Layout: Two level;Able to live on main level with bedroom/bathroom Home Equipment: Shower seat;Bedside commode;Walker - 4 wheels;Walker - standard;Grab bars - toilet (hemi walker)      Prior Function Level of Independence: Independent with assistive device(s)         Comments: Used hemi walker at times since accident     Hand Dominance   Dominant Hand: Right    Extremity/Trunk Assessment   Upper Extremity Assessment: Defer to OT evaluation           Lower Extremity Assessment: Generalized weakness         Communication   Communication: No difficulties  Cognition Arousal/Alertness: Awake/alert Behavior During Therapy: WFL for tasks assessed/performed Overall Cognitive Status: Within Functional Limits for tasks assessed                      General Comments General comments (skin integrity, edema, etc.): States she has poor balance at baseline. falls approx once every 2 months.    Exercises        Assessment/Plan    PT Assessment Patient needs continued PT services  PT Diagnosis Difficulty walking;Abnormality of gait;Generalized weakness;Acute pain   PT Problem List Decreased strength;Decreased activity tolerance;Decreased balance;Decreased mobility;Decreased coordination;Decreased knowledge of use of DME;Decreased knowledge of precautions;Pain  PT Treatment Interventions DME instruction;Gait training;Stair training;Functional mobility training;Therapeutic activities;Therapeutic exercise;Balance training;Neuromuscular re-education;Patient/family education;Modalities   PT Goals (Current goals can be found in the Care Plan section) Acute Rehab PT Goals Patient Stated Goal: Recover PT Goal Formulation: With patient Time For Goal Achievement: 02/14/15 Potential to Achieve Goals: Good    Frequency Min 5X/week   Barriers to discharge        Co-evaluation               End of Session Equipment  Utilized During Treatment: Gait belt Activity Tolerance: Patient tolerated treatment well Patient left: in bed;with call bell/phone within reach;with SCD's reapplied Nurse Communication: Mobility status         Time: [id number] PT Time Calculation (min) (ACUTE ONLY): 33 min   Charges:   PT Evaluation $Initial PT Evaluation Tier I: 1 Procedure PT Treatments $Gait Training: 8-22 mins   PT G CodesEllouise Newer 01/31/2015, 6:12 PM Camille Bal Kincaid, [place]

## 2015-01-31 NOTE — Anesthesia Postprocedure Evaluation (Signed)
  Anesthesia Post-op Note  Patient: [name]  Procedure(s) Performed: Procedure(s): RIGHT REVERSE SHOULDER ARTHROPLASTY (Right)  Patient Location: PACU  Anesthesia Type:General and GA combined with regional for post-op pain  Level of Consciousness: awake, alert  and oriented  Airway and Oxygen Therapy: Patient Spontanous Breathing and Patient connected to nasal cannula oxygen  Post-op Pain: none  Post-op Assessment: Post-op Vital signs reviewed, Patient's Cardiovascular Status Stable, Respiratory Function Stable, Patent Airway and Pain level controlled              Post-op Vital Signs: stable  Last Vitals:  Filed Vitals:   01/31/15 1242  BP: 95/68  Pulse: 68  Temp: 36.2 C  Resp: 18    Complications: No apparent anesthesia complications

## 2015-01-31 NOTE — Transfer of Care (Signed)
Immediate Anesthesia Transfer of Care Note  Patient: [name]  Procedure(s) Performed: Procedure(s): RIGHT REVERSE SHOULDER ARTHROPLASTY (Right)  Patient Location: PACU  Anesthesia Type:General  Level of Consciousness: awake, alert , oriented and patient cooperative  Airway & Oxygen Therapy: Patient Spontanous Breathing and Patient connected to nasal cannula oxygen  Post-op Assessment: Report given to RN, Post -op Vital signs reviewed and stable and Patient moving all extremities  Post vital signs: Reviewed and stable  Complications: No apparent anesthesia complications

## 2015-01-31 NOTE — Anesthesia Preprocedure Evaluation (Addendum)
Anesthesia Evaluation  Patient identified by MRN, date of birth, ID band Patient awake    Airway Mallampati: II  TM Distance: >3 FB Neck ROM: Full    Dental  (+) Teeth Intact, Dental Advisory Given   Pulmonary former smoker,    breath sounds clear to auscultation       Cardiovascular  Rhythm:Irregular Rate:Normal     Neuro/Psych    GI/Hepatic   Endo/Other    Renal/GU      Musculoskeletal   Abdominal (+) + obese,   Peds  Hematology   Anesthesia Other Findings   Reproductive/Obstetrics                            Anesthesia Physical Anesthesia Plan  ASA: III  Anesthesia Plan: General   Post-op Pain Management: GA combined w/ Regional for post-op pain   Induction: Intravenous  Airway Management Planned: Oral ETT  Additional Equipment:   Intra-op Plan:   Post-operative Plan: Extubation in OR  Informed Consent: I have reviewed the patients History and Physical, chart, labs and discussed the procedure including the risks, benefits and alternatives for the proposed anesthesia with the patient or authorized representative who has indicated his/her understanding and acceptance.   Dental advisory given  Plan Discussed with: CRNA and Anesthesiologist  Anesthesia Plan Comments:         Anesthesia Quick Evaluation

## 2015-01-31 NOTE — Anesthesia Procedure Notes (Addendum)
Procedure Name: Intubation Date/Time: 01/31/2015 7:41 AM Performed by: Willeen Cass P Pre-anesthesia Checklist: Patient identified, Emergency Drugs available, Suction available, Patient being monitored and Timeout performed Patient Re-evaluated:Patient Re-evaluated prior to inductionOxygen Delivery Method: Circle system utilized Preoxygenation: Pre-oxygenation with 100% oxygen Intubation Type: IV induction Ventilation: Mask ventilation without difficulty Laryngoscope Size: Mac and 3 Grade View: Grade I Tube type: Oral Tube size: 7.0 mm Number of attempts: 1 Airway Equipment and Method: Stylet Placement Confirmation: ETT inserted through vocal cords under direct vision,  positive ETCO2 and breath sounds checked- equal and bilateral Secured at: 21 cm Tube secured with: Tape Dental Injury: Teeth and Oropharynx as per pre-operative assessment    Anesthesia Regional Block:  Interscalene brachial plexus block  Pre-Anesthetic Checklist: ,, timeout performed, Correct Patient, Correct Site, Correct Laterality, Correct Procedure, Correct Position, site marked, Risks and benefits discussed,  Surgical consent,  Pre-op evaluation,  At surgeon's request and post-op pain management  Laterality: Right  Prep: chloraprep       Needles:  Injection technique: Single-shot  Needle Type: Echogenic Stimulator Needle     Needle Length: 9cm 9 cm Needle Gauge: 21 and 21 G    Additional Needles:  Procedures: ultrasound guided (picture in chart) Interscalene brachial plexus block Narrative:  Start time: 01/31/2015 7:20 AM End time: 01/31/2015 7:25 AM Injection made incrementally with aspirations every 5 mL.  Performed by: Personally   Additional Notes: 20 cc 0.5% Bupivacaine with 1:200 Epi injected easily

## 2015-01-31 NOTE — Op Note (Signed)
01/31/2015  10:18 AM  PATIENT:   [name]  69 y.o. female  PRE-OPERATIVE DIAGNOSIS: severely displaced RIGHT SHOULDER THREE PART  PROXIMAL HUMUS FRACTURE   POST-OPERATIVE DIAGNOSIS:  same  PROCEDURE:  R reverse shoulder arthroplasty  SURGEON:  Joie Reamer, Metta Clines M.D.  ASSISTANTS: Shuford pac   ANESTHESIA:   GET + ISB  EBL: 300  SPECIMEN:  none  Drains: none   PATIENT DISPOSITION:  PACU - hemodynamically stable.    PLAN OF CARE: Admit for overnight observation  Dictation# ??????   Contact # [phone]

## 2015-01-31 NOTE — H&P (Signed)
[name]    Chief Complaint: RIGHT SHOULDER THREE PART  PROXIMAL HUMUS FRACTURE  HPI: The patient is a 69 y.o. female with a severely displaced right 3 part proximal humerus fracture  Past Medical History  Diagnosis Date  . DYSLIPIDEMIA 02/27/2009  . DEPRESSION 06/13/2008  . ALLERGIC RHINITIS 08/01/2009  . ABDOMINAL PAIN RIGHT UPPER QUADRANT 11/08/2008  . CARDIOVASCULAR FUNCTION STUDY, ABNORMAL 04/04/2009  . NON-HODGKIN'S LYMPHOMA, HX OF dx  2005--  chemoradiation completed 2006--  no recurrence    onocologist- dr odogwu--   . OSTEOARTHRITIS, MODERATE 04/07/2010  . CAD, NATIVE VESSEL 05/30/2009    cardiologist- dr Aundra Dubin- visit 04-13-2010 in epic-- denies cardiac symptoms  . DYSPNEA 02/27/2009  . Retroperitoneal fibrosis   . Frequency of urination   . History of Lyme disease   . History of Bell's palsy   . DDD (degenerative disc disease), lumbar   . GERD (gastroesophageal reflux disease)   . Anxiety   . 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 History of DVT of lower extremity 2006-- CHRONIC COUMADIN THERAPY  . Anticoagulated on Coumadin   . A-fib [facility])     Past Surgical History  Procedure Laterality Date  . Multiple cysto/ left ureteral stent exchanges  LAST ONE 10-28-10  . Total knee arthroplasty  OCT 2010    RIGHT  . Total knee arthroplasty  JAN 2010    LEFT  . Knee arthroscopy  2005    RIGHT  . Breast mass excision      BENIGN-- RIGHT BREAST  . Retroperitoneal bx  2007  . Exploratory laparotomy  2005    LYMPHOMA  . Tonsillectomy  CHILD  . Cardiovascular stress test  04-04-2009-- PER PT ASYMPTOMATIC-- MEDICAL MANAGEMENT    STUDY WAS EQUIVOCAL/ EF 49%/ GLOBAL HYPOKINESIS/ APPEARS TO BE A MILD ANTERIOR PERFUSION DEFECT COULD REPRESENT ISCHEMIA BUT DUE TO  ACTIVITY WORSE IN STRESS THAN AT REST POSS. THE DEFECT WAS ARTIFACTUAL  . Transthoracic echocardiogram  [id number]    MILD SYSTOLIC DYSFUNCTION/ EF [id number] MODERATE DIASTOLIC  DYSFUCTION/ MILD MR/ MILD BIATRIAL ENLARGEMENT  . Ct angiogram  FEB 2011    CALCIUM SCORE 161/ POSS. MODERATE MID LAD STENOSIS  . Cystoscopy w/ ureteral stent removal  04/28/2011    Procedure: CYSTOSCOPY WITH STENT REMOVAL;  Surgeon: Fredricka Bonine, MD;  Location: [facility];  Service: Urology;;  . Cystoscopy w/ retrogrades  04/28/2011    Procedure: CYSTOSCOPY WITH RETROGRADE PYELOGRAM;  Surgeon: Fredricka Bonine, MD;  Location: [facility];  Service: Urology;  Laterality: Left;    Family History  Problem Relation Age of Onset  . Arthritis Mother   . Heart disease Mother   . Diabetes Sister   . Hypertension Sister   . Stroke Sister   . Heart attack Mother   . Heart attack Sister     Social History:  reports that she quit smoking about 6 years ago. Her smoking use included Cigarettes. She has a 104 pack-year smoking history. She has never used smokeless tobacco. She reports that she drinks alcohol. She reports that she does not use illicit drugs.  Allergies:  Allergies  Allergen Reactions  . Chlorhexidine Base Itching    CHG WIPES  . Penicillins Hives and Rash    Medications Prior to Admission  Medication Sig Dispense Refill  . ALPRAZolam (XANAX) 1 MG tablet take 1 tablet by mouth three times a day 90 tablet 5  . amiodarone (PACERONE) 200 MG tablet  Take 1 tablet (200 mg total) by mouth daily. 30 tablet 5  . b complex vitamins capsule Take 2 capsules by mouth daily.     Marland Kitchen buPROPion (WELLBUTRIN XL) 300 MG 24 hr tablet take 1 tablet by mouth once daily 90 tablet 1  . Cholecalciferol (VITAMIN D) 2000 UNITS CAPS Take 2,000 Int'l Units by mouth.    . Coenzyme Q10-Fish Oil-Vit E (CO-Q 10 OMEGA-3 FISH OIL PO) Take 100 mg elemental calcium/kg/hr by mouth daily.    . Diclofenac Sodium (PENNSAID) 2 % SOLN Place 1 application onto the skin 3 (three) times daily as needed. 112 g 3  . diltiazem (CARDIZEM CD) 180 MG 24 hr capsule take 1 capsule by  mouth twice a day 60 capsule 5  . DULoxetine (CYMBALTA) 60 MG capsule take 1 capsule by mouth once daily 30 capsule 2  . fluticasone (FLONASE) 50 MCG/ACT nasal spray instill 2 sprays into each nostril once daily (Patient taking differently: instill 2 sprays into each nostril once daily as needed) 16 g 6  . furosemide (LASIX) 20 MG tablet Take 1 tablet (20 mg total) by mouth daily. 90 tablet 2  . loratadine (CLARITIN) 10 MG tablet Take 10 mg by mouth daily.     Marland Kitchen losartan (COZAAR) 25 MG tablet Take 1 tablet (25 mg total) by mouth daily. 90 tablet 3  . Lysine 500 MG TABS Take 500 mg by mouth daily.    Marland Kitchen NATURE-THROID 113.75 MG TABS Take 1 tablet by mouth daily.  0  . NITROSTAT 0.4 MG SL tablet place 1 tablet under the tongue every 5 minutes for UP TO 3 doses if needed 25 tablet 12  . oxyCODONE (ROXICODONE) 5 MG immediate release tablet Take 1 tablet (5 mg total) by mouth every 4 (four) hours as needed for severe pain (take 5-10 mg as needed for pain). 30 tablet 0  . Pitavastatin Calcium (LIVALO) 2 MG TABS take 1 tablet by mouth once daily 30 tablet 6  . Pregnenolone Micronized POWD Take 1 capsule by mouth daily.     Marland Kitchen warfarin (COUMADIN) 5 MG tablet Take 5 mg daily except 7.5 mg on MWF or as directed (Patient taking differently: Take 5 mg by mouth daily. ) 90 tablet 3     Physical Exam: right shoulder with diffuse swelling and tenderness as noted at recent office visit.  Vitals  Temp:  [98.5 F (36.9 C)] 98.5 F (36.9 C) (11/10 0609) Pulse Rate:  [69] 69 (11/10 0609) Resp:  [20] 20 (11/10 0609) BP: (133)/(60) 133/60 mmHg (11/10 0610) SpO2:  [99 %] 99 % (11/10 0609) Weight:  [101.606 kg (224 lb)] 101.606 kg (224 lb) (11/10 0609)  Assessment/Plan  Impression: RIGHT SHOULDER THREE PART  PROXIMAL HUMUS FRACTURE   Plan of Action: Procedure(s): RIGHT REVERSE SHOULDER ARTHROPLASTY  Amenah Tucci M Nashley Cordoba 01/31/2015, 6:26 AM Contact # [phone]

## 2015-01-31 NOTE — Op Note (Signed)
NAMEMEHEK, ZODY NO.:  [id number]  MEDICAL RECORD NO.:  [id number]  LOCATION:  5N19C                        FACILITY:  [facility]  PHYSICIAN:  Metta Clines. Eula Mazzola, M.D.  DATE OF BIRTH:  11/16/1945  DATE OF PROCEDURE:  01/31/2015 DATE OF DISCHARGE:                              OPERATIVE REPORT   PREOPERATIVE DIAGNOSIS:  Severely displaced right 3-part proximal humerus fracture.  POSTOPERATIVE DIAGNOSIS:  Severely displaced right 3-part proximal humerus fracture.  PROCEDURE:  Right shoulder reverse arthroplasty utilizing a cemented size 8 DePuy stem with a +12 extension which is a +9 metal insert and a +3 polyethylene and then a 38 eccentric glenosphere.  SURGEON:  Metta Clines. Damiano Stamper, M.D.  Terrence DupontOlivia Mackie A. Shuford, PA-C.  ANESTHESIA:  General endotracheal as well as interscalene block.  ESTIMATED BLOOD LOSS:  3 mL.  DRAINS:  None.  HISTORY:  Ms. Glazer is a 69 year old female who fell last week, sustaining a severely displaced right 3-part proximal humerus fracture. Due to the degree of displacement, comminution, and poor healing potential, she was brought to the operating room at this time for planned right shoulder reverse arthroplasty.  Preoperatively, I counseled Ms. Hennessee regarding treatment options and potential risks versus benefits thereof.  Possible surgical complications were all reviewed including bleeding, infection, neurovascular injury, persistent pain, loss of motion, anesthetic complication, failure of the implant, and possibly for additional surgery.  She understands and accepts and agrees with our planned procedure.  DESCRIPTION OF PROCEDURE:  After undergoing routine preop evaluation, the patient received prophylactic antibiotics.  Interscalene block was established in the holding area by the Anesthesia Department.  Placed supine on the operating table, underwent smooth induction of a general endotracheal anesthesia.  Placed into  beach-chair position and appropriately padded and protected.  The right shoulder girdle region was sterilely prepped and draped in standard fashion.  A time-out was called.  Anterior deltopectoral approach to the right shoulder was made through a 10 cm incision.  Skin flaps were elevated.  Dissection carried deeply.  Cephalic vein taken off the deltoid.  An interval was developed in proximal and distal and the upper 1.5 cm of the pectoralis major was divided to enhance exposure.  The conjoint tendon was identified, mobilized, retracted medially.  Divided adhesions beneath the deltoid. Biceps was identified, tenotomized for later tenodesis.  Then, we split the rotator cuff along the rotator interval.  We then used an osteotome to help separate the remaining attachments between the articular surface and the greater tuberosity and the lesser tuberosity was a single fragment, although with a large fragment of bone which we rongeured off. Placed a series of grasping sutures at the bone-tendon junction of the subscapularis using #2 FiberWire.  Also, tagged the greater tuberosity at the tendon bone junction.  Once we mobilized and gained access to the tuberosities with our FiberWire sutures, we then divided the articular segment as essentially as single fragment and this was removed.  We then delivered the humeral shaft up through the wound.  Once we cleared all the bony debris, we then used a series of Fukuda, pitchfork, and snake tongue retractors to expose the glenoid.  The labrum was circumferentially excised.  We  gained complete visualization of periphery of the glenoid and placed a guidepin into the center of the glenoid and then reamed the glenoid to a stable subchondral bony surface.  Peripheral reamer was then used.  All residual debris at the margin of glenoid was removed with a rongeur.  The area was irrigated. We placed our central drill hole and then impacted our glenoid base plate  and then used locking screws inferiorly and superiorly and Nonlocked screws anteriorly and posteriorly, all these obtaining excellent bony fit and fixation and terminal fixation was achieved.  We then placed a 38 eccentric glenosphere on the base plate and this was sequentially tightened and properly positioned to our satisfaction.  We then returned our attention to the humeral shaft which we reamed with a hand reamer up to a size 10 which was a tight fit, so we implanted cemented to 8.  We placed a distal cement plug.  Our trial was then placed and showed overall good position alignment.  We mixed cement and ensured cemented the humeral canal.  Our size 8 stem was then introduced at the appropriate level to properly 0 degrees of retroversion.  Once the cement had hardened, we removed all extra cement.  We then performed a series of trial reductions, ultimately found that the +9 metal insert and +3 poly gave [place] the proper soft tissue balance with good stability of the shoulder.  The final construct was then assembled and final reduction was performed.  At this point, then we used sutures that we placed through the fins of our stem and used these to reapproximate both the greater and lesser tuberosities back to the humeral metaphyseal region and also to the tuberosities to one another and also closed the rotator interval with a pair of figure-of-eight FiberWire sutures. Suture limbs were all appropriately clipped.  The wound was copiously irrigated and hemostasis was obtained.  The deltopectoral interval was reapproximated.  The subcu layer was closed with a 2-0 Monocryl, 3-0 Monocryl in subcutaneous, intracuticular 3-0 Monocryl for the skin followed by Dermabond and Aquacel dressing.  Right arm was placed in a sling.  The patient was then awakened, extubated, and taken to the recovery room in stable condition.  Jenetta Loges, PA-C, was used as an Environmental consultant throughout this case, essential  for help with positioning the patient, positioning the extremity, management of the retractors, tissue manipulation, implantation of the prosthesis, wound closure, and intraoperative decision making.     Metta Clines. Nekisha Mcdiarmid, M.D.     KMS/MEDQ  D:  01/31/2015  T:  01/31/2015  Job:  [id number]

## 2015-02-01 LAB — POCT I-STAT 4, (NA,K, GLUC, HGB,HCT)
GLUCOSE: 145 mg/dL — AB (ref 65–99)
HEMATOCRIT: 29 % — AB (ref 36.0–46.0)
Hemoglobin: 9.9 g/dL — ABNORMAL LOW (ref 12.0–15.0)
Potassium: 4.1 mmol/L (ref 3.5–5.1)
Sodium: 139 mmol/L (ref 135–145)

## 2015-02-01 LAB — HEMOGLOBIN AND HEMATOCRIT, BLOOD
HCT: 28.6 % — ABNORMAL LOW (ref 36.0–46.0)
Hemoglobin: 9.3 g/dL — ABNORMAL LOW (ref 12.0–15.0)

## 2015-02-01 LAB — PROTIME-INR
INR: 1.46 (ref 0.00–1.49)
Prothrombin Time: 17.8 seconds — ABNORMAL HIGH (ref 11.6–15.2)

## 2015-02-01 MED ORDER — OXYCODONE-ACETAMINOPHEN 5-325 MG PO TABS: 1.0000 | ORAL_TABLET | ORAL | Status: DC | PRN

## 2015-02-01 MED ORDER — DIAZEPAM 5 MG PO TABS: 2.5000 mg | ORAL_TABLET | Freq: Four times a day (QID) | ORAL | Status: DC | PRN

## 2015-02-01 NOTE — Progress Notes (Signed)
Occupational Therapy Treatment Patient Details Name: [name] MRN: [id number] DOB: 07-23-1945 Today's Date: 02/01/2015    History of present illness 69 y.o. female with a severely displaced right 3 part proximal humerus fracture. seem s/p right reverse shoulder arthroplasty. PMHx: CAD, AFib, non-hodgkin lymphoma   OT comments  Pt making gradual progress toward OT goals. Focus of session was on R UE ROM exercises. Pt with decreased tolerance to PROM to shoulder; only able to complete 5 reps of each exercise. Pt tolerated exercises to elbow, wrist, hand with limited AROM. Discharge plan has been upgraded to SNF due to decreased mobility and independence; pt would benefit from continued rehab prior to returning home in order to maximize independence and safety with ADLs and mobility. Pt is agreeable to SNF placement at this time. Will continue to follow acutely.   Follow Up Recommendations  SNF;Supervision/Assistance - 24 hour    Equipment Recommendations  None recommended by OT    Recommendations for Other Services      Precautions / Restrictions Precautions Precautions: Shoulder Type of Shoulder Precautions: Passive protocol: No AROM of shoulder. PROM FF 60, ABD 45, ER 10. AROM of elbow, wrist, hand OK. NO pendulums. Shoulder Interventions: Shoulder sling/immobilizer;At all times;Off for dressing/bathing/exercises Precaution Comments: Reviewed precautions Required Braces or Orthoses: Sling Restrictions Weight Bearing Restrictions: Yes RUE Weight Bearing: Non weight bearing       Mobility Bed Mobility Overal bed mobility: Needs Assistance Bed Mobility: Sit to Supine       Sit to supine: Mod assist;+2 for physical assistance   General bed mobility comments: Not assessed at this time.  Transfers Overall transfer level: Needs assistance   Transfers: Sit to/from Stand Sit to Stand: Min assist         General transfer comment: Not assessed at this time     Balance Overall balance assessment: Needs assistance Sitting-balance support: No upper extremity supported;Feet supported Sitting balance-Leahy Scale: Fair   Postural control: Posterior lean Standing balance support: Single extremity supported Standing balance-Leahy Scale: Poor                     ADL                                         General ADL Comments: No family present. Discussed possible SNF placement depending on progress with mobility; pt agreeable to SNF if needed. Pt with limited tolerance to shoulder PROM, only able to complete 5 reps of each at this time. Educated on importance of RUE ROM exercises and need to complete 3 x per day; pt verbalized understanding.        Vision                     Perception     Praxis      Cognition   Behavior During Therapy: Flat affect Overall Cognitive Status: Impaired/Different from baseline Area of Impairment: Safety/judgement;Orientation Orientation Level: Disoriented to;Time        Safety/Judgement: Decreased awareness of deficits;Decreased awareness of safety          Extremity/Trunk Assessment               Exercises Shoulder Exercises Shoulder Flexion: PROM;Right;5 reps;Supine (0-20 degrees) Shoulder ABduction: PROM;Right;5 reps;Supine (0-30 degrees) Shoulder External Rotation: PROM;Right;5 reps;Supine (pt unable to achieve neutral position) Elbow Flexion: AAROM;Right;10 reps;Supine (full ROM with  AAROM, limited ROM with AROM) Wrist Flexion: AROM;Right;10 reps;Supine (limited AROM/PROM) Digit Composite Flexion: AROM;Right;10 reps;Supine Donning/doffing sling/immobilizer: Maximal assistance   Shoulder Instructions Shoulder Instructions Donning/doffing sling/immobilizer: Maximal assistance     General Comments      Pertinent Vitals/ Pain       Pain Assessment: Faces Faces Pain Scale: Hurts even more Pain Location: R shoulder with ROM Pain Descriptors /  Indicators: Grimacing Pain Intervention(s): Limited activity within patient's tolerance;Monitored during session;Repositioned;Ice applied  Home Living                                          Prior Functioning/Environment              Frequency Min 3X/week     Progress Toward Goals  OT Goals(current goals can now be found in the care plan section)  Progress towards OT goals: Progressing toward goals  Acute Rehab OT Goals Patient Stated Goal: none stated OT Goal Formulation: With patient ADL Goals Pt Will Perform Upper Body Bathing: with min assist;sitting Pt Will Perform Upper Body Dressing: with min assist;sitting Pt Will Transfer to Toilet: with min assist;stand pivot transfer;bedside commode Pt Will Perform Toileting - Clothing Manipulation and hygiene: with min assist;sit to/from stand Pt/caregiver will Perform Home Exercise Program: Increased ROM;Right Upper extremity;With minimal assist;With written HEP provided  Plan Discharge plan needs to be updated    Co-evaluation                 End of Session Equipment Utilized During Treatment: Other (comment) (sling)   Activity Tolerance Patient tolerated treatment well   Patient Left in bed;with call bell/phone within reach   Nurse Communication          Time: [id number] OT Time Calculation (min): 20 min  Charges: OT General Charges $OT Visit: 1 Procedure OT Treatments $Therapeutic Exercise: 8-22 mins  Binnie Kand M.S., OTR/L Pager: [phone]  02/01/2015, 12:07 PM

## 2015-02-01 NOTE — Evaluation (Signed)
Occupational Therapy Evaluation Patient Details Name: [name] MRN: [id number] DOB: 06/30/1945 Today's Date: 02/01/2015    History of Present Illness 69 y.o. female with a severely displaced right 3 part proximal humerus fracture. seem s/p right reverse shoulder arthroplasty.   Clinical Impression   Pt reports she was independent with ADLs PTA. Currently pt is overall max A for ADLs and mod +2 for sit <> stand.  Began ADL and shoulder education with pt; pt verbalized understanding. Pt planning to d/c home with 24/7 supervision from family. At this time, recommending HHOT with 24/7 supervision in order to maximize independence and safety with ADLs and functional mobility. If pt does not progress with therapy, consider SNF for further rehab prior to returning home. Pt would benefit from continued skilled OT services in order to increase independence and safety with UB ADLs, functional transfers, and shoulder HEP.     Follow Up Recommendations  Home health OT;Supervision/Assistance - 24 hour    Equipment Recommendations  None recommended by OT    Recommendations for Other Services       Precautions / Restrictions Precautions Precautions: Shoulder Type of Shoulder Precautions: Passive protocol: No AROM of shoulder. PROM FF 60, ABD 45, ER 10. AROM of elbow, wrist, hand OK. NO pendulums. Shoulder Interventions: Shoulder sling/immobilizer;At all times;Off for dressing/bathing/exercises Precaution Booklet Issued: Yes (comment) Precaution Comments: Reviewed precautions with pt Required Braces or Orthoses: Sling Restrictions Weight Bearing Restrictions: Yes RUE Weight Bearing: Non weight bearing      Mobility Bed Mobility Overal bed mobility: Needs Assistance Bed Mobility: Supine to Sit     Supine to sit: Mod assist;HOB elevated     General bed mobility comments: Mod A to support trunk at back. Increased time required.  Transfers Overall transfer level: Needs  assistance Equipment used: Hemi-walker Transfers: Sit to/from Stand Sit to Stand: Mod assist;+2 physical assistance         General transfer comment: Multiped attempts for sit <> stand, pt unable to come to full standing position despite max verbal cues.    Balance Overall balance assessment: Needs assistance Sitting-balance support: Single extremity supported Sitting balance-Leahy Scale: Fair     Standing balance support: Single extremity supported Standing balance-Leahy Scale: Poor                              ADL Overall ADL's : Needs assistance/impaired Eating/Feeding: Set up;Sitting   Grooming: Set up;Sitting   Upper Body Bathing: Maximal assistance;Sitting   Lower Body Bathing: Maximal assistance;Sit to/from stand   Upper Body Dressing : Maximal assistance;Sitting   Lower Body Dressing: Maximal assistance;Sit to/from stand   Toilet Transfer: Maximal assistance;+2 for physical assistance (lateral lean to get on bed pan in sitting) Toilet Transfer Details (indicate cue type and reason): Attempted stand pivot transfer to [facility], pt unable to perform stand pivot. Lateral lean to place bed pan under pt in sitting. Toileting- Clothing Manipulation and Hygiene: Maximal assistance;+2 for physical assistance;Sit to/from stand         General ADL Comments: No family present during OT eval. Pt noted to be very weak this AM. Educated pt on UB bathing and dressing technique, sling management and wear schedule, edema management techniques, R UE positioning, safety with hemi walker; pt verbalized understanding. Provided pt with shoulder handout; asked her to look over handout.     Vision     Perception     Praxis  Pertinent Vitals/Pain Pain Assessment: Faces Faces Pain Scale: Hurts whole lot Pain Location: R shoulder during mobility Pain Descriptors / Indicators: Grimacing;Guarding;Sore Pain Intervention(s): Limited activity within patient's  tolerance;Monitored during session;Repositioned;Ice applied     Hand Dominance Right   Extremity/Trunk Assessment Upper Extremity Assessment Upper Extremity Assessment: RUE deficits/detail;Generalized weakness RUE Deficits / Details: AROM hand WFL.  RUE: Unable to fully assess due to pain;Unable to fully assess due to immobilization   Lower Extremity Assessment Lower Extremity Assessment: Generalized weakness   Cervical / Trunk Assessment Cervical / Trunk Assessment: Kyphotic   Communication Communication Communication: No difficulties   Cognition Arousal/Alertness: Awake/alert Behavior During Therapy: WFL for tasks assessed/performed Overall Cognitive Status: Within Functional Limits for tasks assessed                     General Comments       Exercises Exercises: Shoulder     Shoulder Instructions Shoulder Instructions Donning/doffing shirt without moving shoulder:  (educated ) Method for sponge bathing under operated UE:  (educated ) Correct positioning of sling/immobilizer:  (educated ) Sling wearing schedule (on at all times/off for ADL's): Supervision/safety (educated ) Proper positioning of operated UE when showering:  (educated ) Positioning of UE while sleeping: Supervision/safety (educated )    Home Living Family/patient expects to be discharged to:: Private residence Living Arrangements: Spouse/significant other;Children (son) Available Help at Discharge: Family;Available 24 hours/day Type of Home: House Home Access: Stairs to enter [employer] of Steps: 2 Entrance Stairs-Rails: Right Home Layout: Two level;Able to live on main level with bedroom/bathroom     Bathroom Shower/Tub: Walk-in shower;Door   [employer] Toilet: Handicapped height Bathroom Accessibility: Yes How Accessible: Accessible via walker Home Equipment: Shower seat;Bedside commode;Walker - 4 wheels;Walker - standard;Grab bars - toilet (hemi walker)           Prior Functioning/Environment Level of Independence: Independent with assistive device(s)        Comments: Used hemi walker at times since accident    OT Diagnosis: Generalized weakness;Acute pain   OT Problem List: Decreased strength;Decreased range of motion;Decreased activity tolerance;Impaired balance (sitting and/or standing);Decreased safety awareness;Decreased knowledge of use of DME or AE;Decreased knowledge of precautions;Obesity;Pain;Impaired UE functional use   OT Treatment/Interventions: Self-care/ADL training;Therapeutic exercise;DME and/or AE instruction;Patient/family education    OT Goals(Current goals can be found in the care plan section) Acute Rehab OT Goals Patient Stated Goal: to go home OT Goal Formulation: With patient Time For Goal Achievement: 02/15/15 Potential to Achieve Goals: Good ADL Goals Pt Will Perform Upper Body Bathing: with min assist;sitting Pt Will Perform Upper Body Dressing: with min assist;sitting Pt Will Transfer to Toilet: with min assist;stand pivot transfer;bedside commode Pt Will Perform Toileting - Clothing Manipulation and hygiene: with min assist;sit to/from stand Pt/caregiver will Perform Home Exercise Program: Increased ROM;Right Upper extremity;With minimal assist;With written HEP provided  OT Frequency: Min 3X/week   Barriers to D/C:            Co-evaluation              End of Session Equipment Utilized During Treatment: Other (comment) (sling, hemi walker ) Nurse Communication: Mobility status;Weight bearing status  Activity Tolerance: Patient limited by pain;Patient limited by lethargy;Patient limited by fatigue Patient left: in chair;with call bell/phone within reach   Time: 0722-0800 OT Time Calculation (min): 38 min Charges:  OT General Charges $OT Visit: 1 Procedure OT Evaluation $Initial OT Evaluation Tier I: 1 Procedure OT Treatments $Self Care/Home Management :  23-37 mins G-Codes:     Binnie Kand M.S., OTR/L Pager: [phone]  02/01/2015, 8:14 AM

## 2015-02-01 NOTE — Clinical Social Work Note (Signed)
CSW informed by PT on 02/01/2015 morning that patient now being recommended for SNF placement. CSW met with patient at bedside to discuss PT current recommendation. Patient stated patient was agreeable to SNF placement, but had no preference for SNF in [facility]. CSW began SNF work up. RNCM updated CSW patient to discharge home with home health on 02/02/2015.  CSW signing off.  Lubertha Sayres, [facility]: [phone] Surgical: [phone]

## 2015-02-01 NOTE — Discharge Summary (Signed)
PATIENT ID:      [name]  MRN:     [id number] DOB/AGE:    71/71/1947 / 71 y.o.     DISCHARGE SUMMARY  ADMISSION DATE:    01/31/2015 DISCHARGE DATE:    ADMISSION DIAGNOSIS: RIGHT SHOULDER THREE PART  PROXIMAL HUMUS FRACTURE  Past Medical History  Diagnosis Date  . DYSLIPIDEMIA 02/27/2009  . DEPRESSION 06/13/2008  . ALLERGIC RHINITIS 08/01/2009  . ABDOMINAL PAIN RIGHT UPPER QUADRANT 11/08/2008  . CARDIOVASCULAR FUNCTION STUDY, ABNORMAL 04/04/2009  . NON-HODGKIN'S LYMPHOMA, HX OF dx  2005--  chemoradiation completed 2006--  no recurrence    onocologist- dr odogwu--   . OSTEOARTHRITIS, MODERATE 04/07/2010  . CAD, NATIVE VESSEL 05/30/2009    cardiologist- dr Aundra Dubin- visit 04-13-2010 in epic-- denies cardiac symptoms  . DYSPNEA 02/27/2009  . Retroperitoneal fibrosis   . Frequency of urination   . History of Lyme disease   . History of Bell's palsy   . DDD (degenerative disc disease), lumbar   . GERD (gastroesophageal reflux disease)   . Anxiety   . 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 History of DVT of lower extremity 2006-- CHRONIC COUMADIN THERAPY  . Anticoagulated on Coumadin   . A-fib ([place])     DISCHARGE DIAGNOSIS:   Active Problems:   Status post reverse total shoulder replacement   PROCEDURE: Procedure(s): RIGHT REVERSE SHOULDER ARTHROPLASTY on 01/31/2015  CONSULTS:     HISTORY:  See H&P in chart.  HOSPITAL COURSE:  [name] is a 69 y.o. admitted on 01/31/2015 with a chief complaint of right shoulder pain following mechanical following fall, and found to have a diagnosis of [place] .  They were brought to the operating room on 01/31/2015 and underwent Procedure(s): RIGHT REVERSE SHOULDER ARTHROPLASTY.    They were given perioperative antibiotics: Anti-infectives    Start     Dose/Rate Route Frequency Ordered Stop   01/31/15 1300  ceFAZolin (ANCEF) IVPB 2 g/50 mL premix     2  g 100 mL/hr over 30 Minutes Intravenous Every 6 hours 01/31/15 1114 02/01/15 0130   01/31/15 0700  vancomycin (VANCOCIN) 1,500 mg in sodium chloride 0.9 % 500 mL IVPB  Status:  Discontinued     1,500 mg 250 mL/hr over 120 Minutes Intravenous To ShortStay Surgical 01/30/15 1303 01/31/15 1102    .  Patient underwent the above named procedure and tolerated it well. The following day they were hemodynamically stable. She was kept an additional day to work on mobility . She was neurovascularly intact to the operative extremity. OT was ordered and worked with patient per protocol. They were medically and orthopaedically stable for discharge on  day2.    DIAGNOSTIC STUDIES:  RECENT RADIOGRAPHIC STUDIES :  No results found.  RECENT VITAL SIGNS:  Patient Vitals for the past 24 hrs:  BP Temp Temp src Pulse Resp SpO2  02/01/15 0539 (!) 121/59 mmHg 99.5 F (37.5 C) Oral 97 18 94 %  02/01/15 0010 (!) 111/58 mmHg 100.1 F (37.8 C) Oral 95 16 93 %  01/31/15 2031 (!) 124/51 mmHg 98.7 F (37.1 C) Oral 84 16 94 %  01/31/15 1242 95/68 mmHg 97.1 F (36.2 C) Oral 68 18 95 %  .  RECENT EKG RESULTS:    Orders placed or performed in visit on 01/25/15  . EKG 12-Lead    DISCHARGE INSTRUCTIONS:  Discharge Instructions    Discontinue IV    Complete by:  As directed            DISCHARGE MEDICATIONS:     Medication List    STOP taking these medications        ALPRAZolam 1 MG tablet  Commonly known as:  XANAX      TAKE these medications        amiodarone 200 MG tablet  Commonly known as:  PACERONE  Take 1 tablet (200 mg total) by mouth daily.     b complex vitamins capsule  Take 2 capsules by mouth daily.     buPROPion 300 MG 24 hr tablet  Commonly known as:  WELLBUTRIN XL  take 1 tablet by mouth once daily     CO-Q 10 OMEGA-3 FISH OIL PO  Take 100 mg elemental calcium/kg/hr by mouth daily.     diazepam 5 MG tablet  Commonly known as:  VALIUM  Take 0.5 tablets (2.5 mg total) by  mouth every 6 (six) hours as needed for muscle spasms or sedation.     Diclofenac Sodium 2 % Soln  Commonly known as:  PENNSAID  Place 1 application onto the skin 3 (three) times daily as needed.     diltiazem 180 MG 24 hr capsule  Commonly known as:  CARDIZEM CD  take 1 capsule by mouth twice a day     DULoxetine 60 MG capsule  Commonly known as:  CYMBALTA  take 1 capsule by mouth once daily     fluticasone 50 MCG/ACT nasal spray  Commonly known as:  FLONASE  instill 2 sprays into each nostril once daily     furosemide 20 MG tablet  Commonly known as:  LASIX  Take 1 tablet (20 mg total) by mouth daily.     loratadine 10 MG tablet  Commonly known as:  CLARITIN  Take 10 mg by mouth daily.     losartan 25 MG tablet  Commonly known as:  COZAAR  Take 1 tablet (25 mg total) by mouth daily.     Lysine 500 MG Tabs  Take 500 mg by mouth daily.     NATURE-THROID 113.75 MG Tabs  Generic drug:  Thyroid  Take 1 tablet by mouth daily.     NITROSTAT 0.4 MG SL tablet  Generic drug:  nitroGLYCERIN  place 1 tablet under the tongue every 5 minutes for UP TO 3 doses if needed     oxyCODONE 5 MG immediate release tablet  Commonly known as:  ROXICODONE  Take 1 tablet (5 mg total) by mouth every 4 (four) hours as needed for severe pain (take 5-10 mg as needed for pain).     oxyCODONE-acetaminophen 5-325 MG tablet  Commonly known as:  PERCOCET  Take 1-2 tablets by mouth every 4 (four) hours as needed.     Pitavastatin Calcium 2 MG Tabs  Commonly known as:  LIVALO  take 1 tablet by mouth once daily     Pregnenolone Micronized Powd  Take 1 capsule by mouth daily.     Vitamin D 2000 UNITS Caps  Take 2,000 Int'l Units by mouth.     warfarin 5 MG tablet  Commonly known as:  COUMADIN  Take 5 mg daily except 7.5 mg on MWF or as directed        FOLLOW UP VISIT:       Follow-up Information    Follow up with Metta Clines SUPPLE, MD.   Specialty:  Orthopedic Surgery   Why:  call to  be seen in  10-14 days   Contact information:   [address] [place] [address] [phone]       DISCHARGE TO: Home with family  DISPOSITION:Good   DISCHARGE CONDITION:  Festus Barren for Dr. Justice Britain 02/01/2015, 12:33 PM

## 2015-02-01 NOTE — Progress Notes (Signed)
Physical Therapy Treatment Patient Details Name: [name] MRN: [id number] DOB: Jul 24, 1945 Today's Date: 02/01/2015    History of Present Illness 69 y.o. female with a severely displaced right 3 part proximal humerus fracture. seem s/p right reverse shoulder arthroplasty. PMHx: CAD, AFib, non-hodgkin lymphoma    PT Comments    Pt moving very poorly today. Unable to ambulate from chair to bathroom, requires 2 person assist for safety with gait and mobility. Pt continues to try to utilize RUE for transfers and is a high fall risk with shuffling gait and lack of awareness for safety and deficits. Pt has a spouse at home but feel pt would fall before entering house and home and not safe at this time for D/C home. Recommending ST-SNF given current function with pt aware and agreeable to SNF. Will continue to follow to maximize gait and safety. Pt able to void at [facility] but required total assist for pericare.   Follow Up Recommendations  SNF;Supervision/Assistance - 24 hour     Equipment Recommendations  None recommended by PT    Recommendations for Other Services       Precautions / Restrictions Precautions Precautions: Shoulder Type of Shoulder Precautions: Passive protocol: No AROM of shoulder. PROM FF 60, ABD 45, ER 10. AROM of elbow, wrist, hand OK. NO pendulums. Shoulder Interventions: Shoulder sling/immobilizer;At all times;Off for dressing/bathing/exercises Precaution Booklet Issued: Yes (comment) Precaution Comments: Reviewed precautions with pt Required Braces or Orthoses: Sling Restrictions Weight Bearing Restrictions: Yes RUE Weight Bearing: Non weight bearing    Mobility  Bed Mobility Overal bed mobility: Needs Assistance Bed Mobility: Sit to Supine     Supine to sit: Mod assist;HOB elevated Sit to supine: Mod assist;+2 for physical assistance   General bed mobility comments: max cues for sit to sidely left with assist to elevate legs, control trunk descent to  surface and prevent weight bearing on RUE  Transfers Overall transfer level: Needs assistance Equipment used: Hemi-walker Transfers: Sit to/from Stand Sit to Stand: Min assist         General transfer comment: min assist x 4 attempts from recliner and bSC with max cues for pushing with LUE, pt has tendency to try to push with right elbow as well and needs guarding and cues to prevent. Initial stand posterior lean with immediate LOB back to chair  Ambulation/Gait Ambulation/Gait assistance: Min assist;+2 safety/equipment Ambulation Distance (Feet): 8 Feet Assistive device: Quad cane;Hemi-walker Gait Pattern/deviations: Shuffle;Trunk flexed;Narrow base of support   Gait velocity interpretation: Below normal speed for age/gender General Gait Details: Pt initiated gait with quad cane and was able to walk 8' but very unsteady, max cues to put cane fully on the ground an push through it. Pt with shuffling unsteady maintaining excessive flexion and right lean in standing. Attempted walking 3 additional times with hemi walker and quad cane but pt unsafe with each device and required chair pulled to her. Additional trials of 3-5'    Stairs            Wheelchair Mobility    Modified Rankin (Stroke Patients Only)       Balance Overall balance assessment: Needs assistance Sitting-balance support: No upper extremity supported;Feet supported Sitting balance-Leahy Scale: Fair   Postural control: Posterior lean Standing balance support: Single extremity supported Standing balance-Leahy Scale: Poor                      Cognition Arousal/Alertness: Awake/alert Behavior During Therapy: Flat affect Overall Cognitive Status: Impaired/Different  from baseline Area of Impairment: Safety/judgement         Safety/Judgement: Decreased awareness of deficits;Decreased awareness of safety          Exercises Donning/doffing shirt without moving shoulder:  (educated ) Method  for sponge bathing under operated UE:  (educated ) Correct positioning of sling/immobilizer:  (educated ) Sling wearing schedule (on at all times/off for ADL's): Supervision/safety (educated ) Proper positioning of operated UE when showering:  (educated ) Positioning of UE while sleeping: Supervision/safety (educated )    General Comments        Pertinent Vitals/Pain Pain Assessment: No/denies pain Faces Pain Scale: Hurts whole lot Pain Location: R shoulder during mobility Pain Descriptors / Indicators: Grimacing;Guarding;Sore Pain Intervention(s): Limited activity within patient's tolerance;Monitored during session;Repositioned;Ice applied    Home Living Family/patient expects to be discharged to:: Private residence Living Arrangements: Spouse/significant other;Children (son) Available Help at Discharge: Family;Available 24 hours/day Type of Home: House Home Access: Stairs to enter Entrance Stairs-Rails: Right Home Layout: Two level;Able to live on main level with bedroom/bathroom Home Equipment: Shower seat;Bedside commode;Walker - 4 wheels;Walker - standard;Grab bars - toilet (hemi walker)      Prior Function Level of Independence: Independent with assistive device(s)      Comments: Used hemi walker at times since accident   PT Goals (current goals can now be found in the care plan section) Acute Rehab PT Goals Patient Stated Goal: to go home Progress towards PT goals: Not progressing toward goals - comment;Goals downgraded-see care plan    Frequency       PT Plan Discharge plan needs to be updated    Co-evaluation             End of Session Equipment Utilized During Treatment: Gait belt;Other (comment) (sling) Activity Tolerance: Patient tolerated treatment well Patient left: in bed;with call bell/phone within reach;with bed alarm set     Time: [id number] PT Time Calculation (min) (ACUTE ONLY): 23 min  Charges:  $Therapeutic Activity: 23-37 mins                     G Codes:      Melford Aase 2015-02-22, 10:11 AM Elwyn Reach, [place]

## 2015-02-01 NOTE — NC FL2 (Signed)
[place] LEVEL OF CARE SCREENING TOOL     IDENTIFICATION  Patient Name: [name] Birthdate: 07-28-1945 Sex: female Admission Date (Current Location): 01/31/2015  [facility] and [place] Number: [employer] and Address:  [facility] [place], [place] [address], [place], [place] [address]      Provider Number: [id number]  Attending Physician Name and Address:  Justice Britain, MD  Relative Name and Phone Number:       Current Level of Care: Hospital Recommended Level of Care: [place] Prior Approval Number:    Date Approved/Denied:   PASRR Number: [id number]  Discharge Plan: SNF    Current Diagnoses: Patient Active Problem List   Diagnosis Date Noted  . Status post reverse total shoulder replacement 01/31/2015  . Chronic diastolic CHF (congestive heart failure) ([place]) 06/10/2014  . Anxiety state 04/23/2014  . Atrial fibrillation ([place]) 11/13/2013  . Mitral valve disorder 11/13/2013  . AF (paroxysmal atrial fibrillation) ([place]) 12/14/2012  . Obesity (BMI 30-39.9) 11/30/2012  . BP (high blood pressure) 11/03/2012  . Cardiomyopathy ([place]) 09/16/2011  . DVT of lower extremity (deep venous thrombosis) ([place]) 01/22/2011  . DVT, lower extremity ([place]) 01/08/2011  . Osteoarthritis 04/07/2010  . ALLERGIC RHINITIS 08/01/2009  . CAD, NATIVE VESSEL 05/30/2009  . CARDIOVASCULAR FUNCTION STUDY, ABNORMAL 05/01/2009  . HLD (hyperlipidemia) 02/27/2009  . DYSPNEA 02/27/2009  . ABDOMINAL PAIN RIGHT UPPER QUADRANT 11/08/2008  . NHL (non-Hodgkin's lymphoma) ([place]) 11/08/2008  . DEPRESSION 06/13/2008  . FATIGUE 05/30/2008    Orientation ACTIVITIES/SOCIAL BLADDER RESPIRATION    Self, Time, Situation, Place    Continent O2 (As needed) (2L)  BEHAVIORAL SYMPTOMS/MOOD NEUROLOGICAL BOWEL NUTRITION STATUS      Continent (needs assistance with pericare)  (Regular Diet)  PHYSICIAN VISITS COMMUNICATION OF NEEDS Height & Weight Skin     Verbally 5\' 6"  (167.6 cm) 224 lbs. Surgical wounds          AMBULATORY STATUS RESPIRATION    Assist extensive (2+ assist) O2 (As needed) (2L)      Personal Care Assistance Level of Assistance  Bathing, Dressing Bathing Assistance: Maximum assistance (2+ assist)   Dressing Assistance: Maximum assistance (2+ assist)      Functional Limitations Info                SPECIAL CARE FACTORS FREQUENCY  PT (By licensed PT), OT (By licensed OT)                   Additional Factors Info  Allergies   Allergies Info: Chlorhexidine base, penicillins           Current Medications (02/01/2015): Current Facility-Administered Medications  Medication Dose Route Frequency Provider Last Rate Last Dose  . 0.9 %  sodium chloride infusion   Intravenous Once Justice Britain, MD      . acetaminophen (TYLENOL) tablet 650 mg  650 mg Oral Q6H PRN Jenetta Loges, PA-C       Or  . acetaminophen (TYLENOL) suppository 650 mg  650 mg Rectal Q6H PRN Jenetta Loges, PA-C      . ALPRAZolam Duanne Moron) tablet 1 mg  1 mg Oral TID Jenetta Loges, PA-C   1 mg at 02/01/15 0840  . amiodarone (PACERONE) tablet 200 mg  200 mg Oral Daily Tracy Shuford, PA-C   200 mg at 02/01/15 [id number]  . bisacodyl (DULCOLAX) EC tablet 5 mg  5 mg Oral Daily PRN Tracy Shuford, PA-C      . buPROPion (WELLBUTRIN XL)  24 hr tablet 300 mg  300 mg Oral Daily Tracy Shuford, PA-C   300 mg at 02/01/15 0841  . diltiazem (CARDIZEM CD) 24 hr capsule 180 mg  180 mg Oral BID Jenetta Loges, PA-C   180 mg at 02/01/15 0842  . diphenhydrAMINE (BENADRYL) 12.5 MG/5ML elixir 12.5-25 mg  12.5-25 mg Oral Q4H PRN Tracy Shuford, PA-C      . docusate sodium (COLACE) capsule 100 mg  100 mg Oral BID Jenetta Loges, PA-C   100 mg at 02/01/15 0841  . DULoxetine (CYMBALTA) DR capsule 60 mg  60 mg Oral Daily Tracy Shuford, PA-C   60 mg at 02/01/15 0840  . fluticasone (FLONASE) 50 MCG/ACT nasal spray 1 spray  1 spray Each Nare Daily Tracy Shuford, PA-C   1 spray at  02/01/15 0843  . furosemide (LASIX) tablet 20 mg  20 mg Oral Daily Tracy Shuford, PA-C   20 mg at 02/01/15 0841  . HYDROmorphone (DILAUDID) injection 1 mg  1 mg Intravenous Q2H PRN Jenetta Loges, PA-C   1 mg at 01/31/15 1135  . Influenza vac split quadrivalent PF (FLUARIX) injection 0.5 mL  0.5 mL Intramuscular Tomorrow-1000 Justice Britain, MD      . lactated ringers infusion   Intravenous Continuous Jenetta Loges, PA-C 75 mL/hr at 01/31/15 2124    . loratadine (CLARITIN) tablet 10 mg  10 mg Oral Daily Tracy Shuford, PA-C   10 mg at 02/01/15 0843  . losartan (COZAAR) tablet 25 mg  25 mg Oral Daily Tracy Shuford, PA-C   25 mg at 02/01/15 0843  . magnesium citrate solution 1 Bottle  1 Bottle Oral Once PRN Jenetta Loges, PA-C      . menthol-cetylpyridinium (CEPACOL) lozenge 3 mg  1 lozenge Oral PRN Olivia Mackie Shuford, PA-C       Or  . phenol (CHLORASEPTIC) mouth spray 1 spray  1 spray Mouth/Throat PRN Tracy Shuford, PA-C      . methocarbamol (ROBAXIN) tablet 500 mg  500 mg Oral Q6H PRN Jenetta Loges, PA-C   500 mg at 02/01/15 0844   Or  . methocarbamol (ROBAXIN) 500 mg in dextrose 5 % 50 mL IVPB  500 mg Intravenous Q6H PRN Tracy Shuford, PA-C      . metoCLOPramide (REGLAN) tablet 5-10 mg  5-10 mg Oral Q8H PRN Tracy Shuford, PA-C       Or  . metoCLOPramide (REGLAN) injection 5-10 mg  5-10 mg Intravenous Q8H PRN Tracy Shuford, PA-C      . nitroGLYCERIN (NITROSTAT) SL tablet 0.4 mg  0.4 mg Sublingual Q5 min PRN Justice Britain, MD      . ondansetron [facility]) tablet 4 mg  4 mg Oral Q6H PRN Tracy Shuford, PA-C       Or  . ondansetron (ZOFRAN) injection 4 mg  4 mg Intravenous Q6H PRN Tracy Shuford, PA-C      . oxyCODONE (Oxy IR/ROXICODONE) immediate release tablet 5-10 mg  5-10 mg Oral Q3H PRN Jenetta Loges, PA-C   10 mg at 02/01/15 0844  . polyethylene glycol (MIRALAX / GLYCOLAX) packet 17 g  17 g Oral Daily PRN Tracy Shuford, PA-C      . pravastatin (PRAVACHOL) tablet 40 mg  40 mg Oral q1800 Tracy Shuford,  PA-C   40 mg at 01/31/15 1719  . thyroid (ARMOUR) tablet 120 mg  120 mg Oral QAC breakfast Justice Britain, MD   120 mg at 02/01/15 0840  . warfarin (COUMADIN) tablet 5 mg  5 mg Oral q1800  Tracy Shuford, PA-C   5 mg at 01/31/15 1719  . Warfarin - Physician Dosing Inpatient   Does not apply [id number] Justice Britain, MD       Do not use this list as official medication orders. Please verify with discharge summary.  Discharge Medications:   Medication List    ASK your doctor about these medications        ALPRAZolam 1 MG tablet  Commonly known as:  XANAX  take 1 tablet by mouth three times a day     amiodarone 200 MG tablet  Commonly known as:  PACERONE  Take 1 tablet (200 mg total) by mouth daily.     b complex vitamins capsule  Take 2 capsules by mouth daily.     buPROPion 300 MG 24 hr tablet  Commonly known as:  WELLBUTRIN XL  take 1 tablet by mouth once daily     CO-Q 10 OMEGA-3 FISH OIL PO  Take 100 mg elemental calcium/kg/hr by mouth daily.     Diclofenac Sodium 2 % Soln  Commonly known as:  PENNSAID  Place 1 application onto the skin 3 (three) times daily as needed.     diltiazem 180 MG 24 hr capsule  Commonly known as:  CARDIZEM CD  take 1 capsule by mouth twice a day     DULoxetine 60 MG capsule  Commonly known as:  CYMBALTA  take 1 capsule by mouth once daily     fluticasone 50 MCG/ACT nasal spray  Commonly known as:  FLONASE  instill 2 sprays into each nostril once daily     furosemide 20 MG tablet  Commonly known as:  LASIX  Take 1 tablet (20 mg total) by mouth daily.     loratadine 10 MG tablet  Commonly known as:  CLARITIN  Take 10 mg by mouth daily.     losartan 25 MG tablet  Commonly known as:  COZAAR  Take 1 tablet (25 mg total) by mouth daily.     Lysine 500 MG Tabs  Take 500 mg by mouth daily.     NATURE-THROID 113.75 MG Tabs  Generic drug:  Thyroid  Take 1 tablet by mouth daily.     NITROSTAT 0.4 MG SL tablet  Generic drug:  nitroGLYCERIN   place 1 tablet under the tongue every 5 minutes for UP TO 3 doses if needed     oxyCODONE 5 MG immediate release tablet  Commonly known as:  ROXICODONE  Take 1 tablet (5 mg total) by mouth every 4 (four) hours as needed for severe pain (take 5-10 mg as needed for pain).     Pitavastatin Calcium 2 MG Tabs  Commonly known as:  LIVALO  take 1 tablet by mouth once daily     Pregnenolone Micronized Powd  Take 1 capsule by mouth daily.     Vitamin D 2000 UNITS Caps  Take 2,000 Int'l Units by mouth.     warfarin 5 MG tablet  Commonly known as:  COUMADIN  Take 5 mg daily except 7.5 mg on MWF or as directed        Relevant Imaging Results:  Relevant Lab Results:  Recent Labs    Additional Information SSN [id number]  Dulcy Fanny, LCSW

## 2015-02-01 NOTE — Care Management Note (Signed)
Case Management Note  Patient Details  Name: [name] MRN: [id number] Date of Birth: [birth year]  Subjective/Objective:    69 yr old female admitted with right humerus fracture, underwent a right reverse shoulder arthroplasty.                 Action/Plan:  Case manager spoke with patient and PA, Olivia Mackie. [name] was independent prior to admission. Will discharge home with Home Health therapy and family assistance. Choice was offered. Referral was called to Rhett Bannister, [facility] Liaison. Patient has no DME needs.    Expected Discharge Date:    02/02/15              Expected Discharge Plan:   Home with Home Health  In-House Referral:  NA  Discharge planning Services  CM Consult  Post Acute Care Choice:  Home Health Choice offered to:  Patient  DME Arranged:  N/A DME Agency:  NA  HH Arranged:  PT, OT HH Agency:  [facility]  Status of Service:  Completed, signed off  Medicare Important Message Given:    Date Medicare IM Given:    Medicare IM give by:    Date Additional Medicare IM Given:    Additional Medicare Important Message give by:     If discussed at [place] of Stay Meetings, dates discussed:    Additional Comments:  Ninfa Meeker, RN 02/01/2015, 12:43 PM

## 2015-02-01 NOTE — Progress Notes (Signed)
[name]  MRN: [id number] DOB/Age: [birth year] 69 y.o. Physician: Rada Hay Procedure: Procedure(s) (LRB): RIGHT REVERSE SHOULDER ARTHROPLASTY (Right)     Subjective: A little confused as to dates, just medicated though. Also required a lot of assistance first time out of bed this am  Vital Signs Temp:  [97.1 F (36.2 C)-100.1 F (37.8 C)] 99.5 F (37.5 C) (11/11 0539) Pulse Rate:  [68-97] 97 (11/11 0539) Resp:  [16-18] 18 (11/11 0539) BP: (95-124)/(51-68) 121/59 mmHg (11/11 0539) SpO2:  [93 %-95 %] 94 % (11/11 0539)  Lab Results  Recent Labs  01/31/15 0905 02/01/15 0540  HGB 9.9* 9.3*  HCT 29.0* 28.6*   BMET  Recent Labs  01/31/15 0905  NA 139  K 4.1  GLUCOSE 145*   INR  Date Value Ref Range Status  02/01/2015 1.46 0.00 - 1.49 Final  01/17/2015 3.90* 2.00 - 3.50 Final    Comment:    INR is useful only to assess adequacy of anticoagulation with coumadin when comparing results from different labs. It should not be used to estimate bleeding risk or presence/abscense of coagulopathy in patients not on coumadin. Expected INR ranges for  nontherapeutic patients is 0.88 - 1.12.   12/14/2014 2.7  Final     Exam Right shoulder NVI  Dressing dry        Plan She was at home with this shoulder fracture for almost 2 weeks prior to admission and has family support. Will keep an additional day to mobilize but feel like skilled nursing home is not necessary Allow meds to clear and mobilize Plan DC home tomorrow with home health services arranged  [facility] PA-C  for Dr.Kevin Supple 02/01/2015, 12:26 PM Contact # [phone]

## 2015-02-01 NOTE — Discharge Instructions (Signed)
Metta Clines. Supple, M.D., F.A.A.O.S. Orthopaedic Surgery Specializing in Arthroscopic and Reconstructive Surgery of the Shoulder and Knee [phone] [address] [place], [place] [address] - Fax [phone]   POST-OP TOTAL SHOULDER REPLACEMENT/SHOULDER HEMIARTHROPLASTY INSTRUCTIONS  1. Call the office at [phone] to schedule your first post-op appointment 10-14 days from the date of your surgery.  2. The bandage over your incision is waterproof. You may begin showering with this dressing on. You may leave this dressing on until first follow up appointment within 2 weeks. We prefer you leave this dressing in place until follow up however after 5-7 days if you are having itching or skin irritation and would like to remove it you may do so. Go slow and tug at the borders gently to break the bond the dressing has with the skin. At this point if there is no drainage it is okay to go without a bandage or you may cover it with a light guaze and tape. You can also expect significant bruising around your shoulder that will drift down your arm and into your chest wall. This is very normal and should resolve over several days.   3. Wear your sling/immobilizer at all times except to perform the exercises below or to occasionally let your arm dangle by your side to stretch your elbow. You also need to sleep in your sling immobilizer until instructed otherwise.  4. Range of motion to your elbow, wrist, and hand are encouraged 3-5 times daily. Exercise to your hand and fingers helps to reduce swelling you may experience.  5. Utilize ice to the shoulder 3-5 times minimum a day and additionally if you are experiencing pain.  6. Prescriptions for a pain medication and a muscle relaxant are provided for you. It is recommended that if you are experiencing pain that you pain medication alone is not controlling, add the muscle relaxant along with the pain medication which can give additional pain  relief. The first 1-2 days is generally the most severe of your pain and then should gradually decrease. As your pain lessens it is recommended that you decrease your use of the pain medications to an "as needed basis'" only and to always comply with the recommended dosages of the pain medications.  7. Pain medications can produce constipation along with their use. If you experience this, the use of an over the counter stool softener or laxative daily is recommended.   8. For most patients, if insurance allows, home health services to include therapy has been arranged.  9. For additional questions or concerns, please do not hesitate to call the office. If after hours there is an answering service to forward your concerns to the physician on call.  POST-OP EXERCISES  May allow arm to dangle and move elbow wrist and hand Other exercises as instructed by the therapist  Information on my medicine - Coumadin   (Warfarin)  This medication education was reviewed with me or my healthcare representative as part of my discharge preparation.  The pharmacist that spoke with me during my hospital stay was:  Romona Curls, [facility]  Why was Coumadin prescribed for you? Coumadin was prescribed for you because you have a blood clot or a medical condition that can cause an increased risk of forming blood clots. Blood clots can cause serious health problems by blocking the flow of blood to the heart, lung, or brain. Coumadin can prevent harmful blood clots from forming. As a reminder your indication for Coumadin  is:   Select from menu  What test will check on my response to Coumadin? While on Coumadin (warfarin) you will need to have an INR test regularly to ensure that your dose is keeping you in the desired range. The INR (international normalized ratio) number is calculated from the result of the laboratory test called prothrombin time (PT).  If an INR APPOINTMENT HAS NOT ALREADY BEEN MADE FOR YOU please  schedule an appointment to have this lab work done by your health care provider within 7 days. Your INR goal is usually a number between:  2 to 3 or your provider may give you a more narrow range like 2-2.5.  Ask your health care provider during an office visit what your goal INR is.  What  do you need to  know  About  COUMADIN? Take Coumadin (warfarin) exactly as prescribed by your healthcare provider about the same time each day.  DO NOT stop taking without talking to the doctor who prescribed the medication.  Stopping without other blood clot prevention medication to take the place of Coumadin may increase your risk of developing a new clot or stroke.  Get refills before you run out.  What do you do if you miss a dose? If you miss a dose, take it as soon as you remember on the same day then continue your regularly scheduled regimen the next day.  Do not take two doses of Coumadin at the same time.  Important Safety Information A possible side effect of Coumadin (Warfarin) is an increased risk of bleeding. You should call your healthcare provider right away if you experience any of the following: ? Bleeding from an injury or your nose that does not stop. ? Unusual colored urine (red or dark brown) or unusual colored stools (red or black). ? Unusual bruising for unknown reasons. ? A serious fall or if you hit your head (even if there is no bleeding).  Some foods or medicines interact with Coumadin (warfarin) and might alter your response to warfarin. To help avoid this: ? Eat a balanced diet, maintaining a consistent amount of Vitamin K. ? Notify your provider about major diet changes you plan to make. ? Avoid alcohol or limit your intake to 1 drink for women and 2 drinks for men per day. (1 drink is 5 oz. wine, 12 oz. beer, or 1.5 oz. liquor.)  Make sure that ANY health care provider who prescribes medication for you knows that you are taking Coumadin (warfarin).  Also make sure the  healthcare provider who is monitoring your Coumadin knows when you have started a new medication including herbals and non-prescription products.  Coumadin (Warfarin)  Major Drug Interactions  Increased Warfarin Effect Decreased Warfarin Effect  Alcohol (large quantities) Antibiotics (esp. Septra/Bactrim, Flagyl, Cipro) Amiodarone (Cordarone) Aspirin (ASA) Cimetidine (Tagamet) Megestrol (Megace) NSAIDs (ibuprofen, naproxen, etc.) Piroxicam (Feldene) Propafenone (Rythmol SR) Propranolol (Inderal) Isoniazid (INH) Posaconazole (Noxafil) Barbiturates (Phenobarbital) Carbamazepine (Tegretol) Chlordiazepoxide (Librium) Cholestyramine (Questran) Griseofulvin Oral Contraceptives Rifampin Sucralfate (Carafate) Vitamin K   Coumadin (Warfarin) Major Herbal Interactions  Increased Warfarin Effect Decreased Warfarin Effect  Garlic Ginseng Ginkgo biloba Coenzyme Q10 Green tea St. Johns wort    Coumadin (Warfarin) FOOD Interactions  Eat a consistent number of servings per week of foods HIGH in Vitamin K (1 serving =  cup)  Collards (cooked, or boiled & drained) Kale (cooked, or boiled & drained) Mustard greens (cooked, or boiled & drained) Parsley *serving size only =  cup Spinach (cooked, or  cup °Spinach (cooked, or boiled & drained) °Swiss chard (cooked, or boiled & drained) °Turnip greens (cooked, or boiled & drained)  °Eat a consistent number of servings per week of foods MEDIUM-HIGH in Vitamin K °(1 serving = 1 cup)  °Asparagus (cooked, or boiled & drained) °Broccoli (cooked, boiled & drained, or raw & chopped) °Brussel sprouts (cooked, or boiled & drained) *serving size only = ½ cup °Lettuce, raw (green leaf, endive, romaine) °Spinach, raw °Turnip greens, raw & chopped  ° °These websites have more information on Coumadin (warfarin):  www.coumadin.com; °www.ahrq.gov/consumer/coumadin.htm; ° ° ° ° °

## 2015-02-01 NOTE — Clinical Social Work Placement (Signed)
   CLINICAL SOCIAL WORK PLACEMENT  NOTE  Date:  02/01/2015  Patient Details  Name: [name] MRN: [id number] Date of Birth: 04-03-1945  Clinical Social Work is seeking post-discharge placement for this patient at the [place] level of care (*CSW will initial, date and re-position this form in  chart as items are completed):  Yes   Patient/family provided with [place] Work Department's list of facilities offering this level of care within the geographic area requested by the patient (or if unable, by the patient's family).  Yes   Patient/family informed of their freedom to choose among providers that offer the needed level of care, that participate in Medicare, Medicaid or managed care program needed by the patient, have an available bed and are willing to accept the patient.  Yes   Patient/family informed of [facility] ownership interest in [facility] and [facility], as well as of the fact that they are under no obligation to receive care at these facilities.  PASRR submitted to EDS on 02/01/15     PASRR number received on 02/01/15     Existing PASRR number confirmed on       FL2 transmitted to all facilities in geographic area requested by pt/family on 02/01/15     FL2 transmitted to all facilities within larger geographic area on       Patient informed that his/her managed care company has contracts with or will negotiate with certain facilities, including the following:            Patient/family informed of bed offers received.  Patient chooses bed at       Physician recommends and patient chooses bed at      Patient to be transferred to   on  .  Patient to be transferred to facility by       Patient family notified on   of transfer.  Name of family member notified:        PHYSICIAN       Additional Comment:    _______________________________________________ Dulcy Fanny, LCSW 02/01/2015, 11:46 AM

## 2015-02-02 MED ORDER — PROMETHAZINE HCL 25 MG/ML IJ SOLN: 12.5000 mg | Freq: Four times a day (QID) | INTRAMUSCULAR | Status: DC | PRN

## 2015-02-02 NOTE — Progress Notes (Signed)
   Subjective: 2 Days Post-Op Procedure(s) (LRB): RIGHT REVERSE SHOULDER ARTHROPLASTY (Right)  C/o mild to moderate pain in the shoulder s/p surgery Mild apprehension about d/c home today but ready Patient reports pain as moderate.  Objective:   VITALS:   Filed Vitals:   02/02/15 0532  BP: 94/51  Pulse: 85  Temp: 98.3 F (36.8 C)  Resp: 17    Right shoulder incision healing appropriately nv intact distally No rashes  Mild edema distally  LABS  Recent Labs  01/31/15 0905 02/01/15 0540  HGB 9.9* 9.3*  HCT 29.0* 28.6*     Recent Labs  01/31/15 0905  NA 139  K 4.1  GLUCOSE 145*     Assessment/Plan: 2 Days Post-Op Procedure(s) (LRB): RIGHT REVERSE SHOULDER ARTHROPLASTY (Right) D/c home today F/u in with Dr. Onnie Graham in 2 weeks Pain control  Pulmonary toilet    Merla Riches, [place], PA-C  02/02/2015, 7:46 AM

## 2015-02-02 NOTE — Discharge Summary (Signed)
Physician Discharge Summary   Patient ID: [name] MRN: [id number] DOB/AGE: 1945/04/13 69 y.o.  Admit date: 01/31/2015 Discharge date: 02/02/2015  Admission Diagnoses:  Active Problems:   Status post reverse total shoulder replacement   Discharge Diagnoses:  Same   Surgeries: Procedure(s): RIGHT REVERSE SHOULDER ARTHROPLASTY on 01/31/2015   Consultants: PT/OT  Discharged Condition: Stable  Hospital Course: [name] is an 69 y.o. female who was admitted 01/31/2015 with a chief complaint of No chief complaint on file. , and found to have a diagnosis of <principal problem not specified>.  They were brought to the operating room on 01/31/2015 and underwent the above named procedures.    The patient had an uncomplicated hospital course and was stable for discharge.  Recent vital signs:  Filed Vitals:   02/02/15 0532  BP: 94/51  Pulse: 85  Temp: 98.3 F (36.8 C)  Resp: 17    Recent laboratory studies:  Results for orders placed or performed during the hospital encounter of 01/31/15  APTT  Result Value Ref Range   aPTT 30 24 - 37 seconds  Protime-INR  Result Value Ref Range   Prothrombin Time 17.4 (H) 11.6 - 15.2 seconds   INR 1.41 0.00 - 1.49  Hemoglobin and hematocrit, blood  Result Value Ref Range   Hemoglobin 9.3 (L) 12.0 - 15.0 g/dL   HCT 28.6 (L) 36.0 - 46.0 %  Protime-INR  Result Value Ref Range   Prothrombin Time 17.8 (H) 11.6 - 15.2 seconds   INR 1.46 0.00 - 1.49  I-STAT 4, (NA,K, GLUC, HGB,HCT)  Result Value Ref Range   Sodium 139 135 - 145 mmol/L   Potassium 4.1 3.5 - 5.1 mmol/L   Glucose, Bld 145 (H) 65 - 99 mg/dL   HCT 29.0 (L) 36.0 - 46.0 %   Hemoglobin 9.9 (L) 12.0 - 15.0 g/dL  Prepare RBC  Result Value Ref Range   Order Confirmation ORDER PROCESSED BY BLOOD BANK     Discharge Medications:     Medication List    STOP taking these medications        ALPRAZolam 1 MG tablet  Commonly known as:  XANAX      TAKE these  medications        amiodarone 200 MG tablet  Commonly known as:  PACERONE  Take 1 tablet (200 mg total) by mouth daily.     b complex vitamins capsule  Take 2 capsules by mouth daily.     buPROPion 300 MG 24 hr tablet  Commonly known as:  WELLBUTRIN XL  take 1 tablet by mouth once daily     CO-Q 10 OMEGA-3 FISH OIL PO  Take 100 mg elemental calcium/kg/hr by mouth daily.     diazepam 5 MG tablet  Commonly known as:  VALIUM  Take 0.5 tablets (2.5 mg total) by mouth every 6 (six) hours as needed for muscle spasms or sedation.     Diclofenac Sodium 2 % Soln  Commonly known as:  PENNSAID  Place 1 application onto the skin 3 (three) times daily as needed.     diltiazem 180 MG 24 hr capsule  Commonly known as:  CARDIZEM CD  take 1 capsule by mouth twice a day     DULoxetine 60 MG capsule  Commonly known as:  CYMBALTA  take 1 capsule by mouth once daily     fluticasone 50 MCG/ACT nasal spray  Commonly known as:  FLONASE  instill 2 sprays into each nostril once daily  furosemide 20 MG tablet  Commonly known as:  LASIX  Take 1 tablet (20 mg total) by mouth daily.     loratadine 10 MG tablet  Commonly known as:  CLARITIN  Take 10 mg by mouth daily.     losartan 25 MG tablet  Commonly known as:  COZAAR  Take 1 tablet (25 mg total) by mouth daily.     Lysine 500 MG Tabs  Take 500 mg by mouth daily.     NATURE-THROID 113.75 MG Tabs  Generic drug:  Thyroid  Take 1 tablet by mouth daily.     NITROSTAT 0.4 MG SL tablet  Generic drug:  nitroGLYCERIN  place 1 tablet under the tongue every 5 minutes for UP TO 3 doses if needed     oxyCODONE 5 MG immediate release tablet  Commonly known as:  ROXICODONE  Take 1 tablet (5 mg total) by mouth every 4 (four) hours as needed for severe pain (take 5-10 mg as needed for pain).     oxyCODONE-acetaminophen 5-325 MG tablet  Commonly known as:  PERCOCET  Take 1-2 tablets by mouth every 4 (four) hours as needed.     Pitavastatin  Calcium 2 MG Tabs  Commonly known as:  LIVALO  take 1 tablet by mouth once daily     Pregnenolone Micronized Powd  Take 1 capsule by mouth daily.     Vitamin D 2000 UNITS Caps  Take 2,000 Int'l Units by mouth.     warfarin 5 MG tablet  Commonly known as:  COUMADIN  Take 5 mg daily except 7.5 mg on MWF or as directed        Diagnostic Studies: No results found.  Disposition: 01-Home or Self Care      Discharge Instructions    Discontinue IV    Complete by:  As directed            Follow-up Information    Follow up with Metta Clines SUPPLE, MD.   Specialty:  Orthopedic Surgery   Why:  call to be seen in 10-14 days   Contact information:   [address] [place] [address] [phone]       Follow up with [facility].   Specialty:  [place]   Why:  You will receive a call from someone at [facility] to arrange start time for therapy. Start of Care will be Sunday, Nov.13,2016   Contact information:   [place] [address] [phone]        Signed: Ventura Bruns 02/02/2015, 7:48 AM

## 2015-02-03 ENCOUNTER — Observation Stay (HOSPITAL_COMMUNITY)
Admission: EM | Admit: 2015-02-03 | Discharge: 2015-02-04 | Disposition: A | Discharge status: Skilled Nursing Facility | Payer: Medicare Other | Attending: Internal Medicine | Admitting: Internal Medicine

## 2015-02-03 ENCOUNTER — Encounter (HOSPITAL_COMMUNITY): Payer: Self-pay | Admitting: *Deleted

## 2015-02-03 ENCOUNTER — Emergency Department (HOSPITAL_COMMUNITY): Payer: Medicare Other

## 2015-02-03 ENCOUNTER — Observation Stay (HOSPITAL_COMMUNITY): Payer: Medicare Other

## 2015-02-03 DIAGNOSIS — E785 Hyperlipidemia, unspecified: Secondary | ICD-10-CM | POA: Diagnosis not present

## 2015-02-03 DIAGNOSIS — N179 Acute kidney failure, unspecified: Secondary | ICD-10-CM | POA: Diagnosis not present

## 2015-02-03 DIAGNOSIS — M25511 Pain in right shoulder: Secondary | ICD-10-CM | POA: Diagnosis not present

## 2015-02-03 DIAGNOSIS — Z96653 Presence of artificial knee joint, bilateral: Secondary | ICD-10-CM | POA: Diagnosis not present

## 2015-02-03 DIAGNOSIS — E871 Hypo-osmolality and hyponatremia: Secondary | ICD-10-CM | POA: Diagnosis not present

## 2015-02-03 DIAGNOSIS — Z96611 Presence of right artificial shoulder joint: Secondary | ICD-10-CM | POA: Insufficient documentation

## 2015-02-03 DIAGNOSIS — I4891 Unspecified atrial fibrillation: Secondary | ICD-10-CM | POA: Insufficient documentation

## 2015-02-03 DIAGNOSIS — R531 Weakness: Secondary | ICD-10-CM | POA: Insufficient documentation

## 2015-02-03 DIAGNOSIS — R51 Headache: Secondary | ICD-10-CM | POA: Insufficient documentation

## 2015-02-03 DIAGNOSIS — Z880 Allergy status to penicillin: Secondary | ICD-10-CM | POA: Insufficient documentation

## 2015-02-03 DIAGNOSIS — W19XXXA Unspecified fall, initial encounter: Secondary | ICD-10-CM | POA: Insufficient documentation

## 2015-02-03 DIAGNOSIS — J449 Chronic obstructive pulmonary disease, unspecified: Secondary | ICD-10-CM | POA: Diagnosis not present

## 2015-02-03 DIAGNOSIS — I2510 Atherosclerotic heart disease of native coronary artery without angina pectoris: Secondary | ICD-10-CM | POA: Diagnosis not present

## 2015-02-03 DIAGNOSIS — R4182 Altered mental status, unspecified: Secondary | ICD-10-CM | POA: Diagnosis not present

## 2015-02-03 DIAGNOSIS — E119 Type 2 diabetes mellitus without complications: Secondary | ICD-10-CM | POA: Insufficient documentation

## 2015-02-03 DIAGNOSIS — Z8673 Personal history of transient ischemic attack (TIA), and cerebral infarction without residual deficits: Secondary | ICD-10-CM | POA: Diagnosis not present

## 2015-02-03 DIAGNOSIS — Z8572 Personal history of non-Hodgkin lymphomas: Secondary | ICD-10-CM | POA: Insufficient documentation

## 2015-02-03 DIAGNOSIS — I481 Persistent atrial fibrillation: Secondary | ICD-10-CM | POA: Diagnosis not present

## 2015-02-03 DIAGNOSIS — I5032 Chronic diastolic (congestive) heart failure: Secondary | ICD-10-CM | POA: Diagnosis not present

## 2015-02-03 DIAGNOSIS — R5381 Other malaise: Secondary | ICD-10-CM | POA: Insufficient documentation

## 2015-02-03 DIAGNOSIS — T8189XA Other complications of procedures, not elsewhere classified, initial encounter: Secondary | ICD-10-CM | POA: Diagnosis not present

## 2015-02-03 DIAGNOSIS — G9340 Encephalopathy, unspecified: Principal | ICD-10-CM | POA: Insufficient documentation

## 2015-02-03 DIAGNOSIS — N183 Chronic kidney disease, stage 3 (moderate): Secondary | ICD-10-CM | POA: Diagnosis not present

## 2015-02-03 DIAGNOSIS — Z96619 Presence of unspecified artificial shoulder joint: Secondary | ICD-10-CM

## 2015-02-03 DIAGNOSIS — C8590 Non-Hodgkin lymphoma, unspecified, unspecified site: Secondary | ICD-10-CM | POA: Diagnosis present

## 2015-02-03 DIAGNOSIS — Z7901 Long term (current) use of anticoagulants: Secondary | ICD-10-CM

## 2015-02-03 DIAGNOSIS — S0003XA Contusion of scalp, initial encounter: Secondary | ICD-10-CM

## 2015-02-03 DIAGNOSIS — D649 Anemia, unspecified: Secondary | ICD-10-CM | POA: Diagnosis not present

## 2015-02-03 DIAGNOSIS — Z87891 Personal history of nicotine dependence: Secondary | ICD-10-CM | POA: Diagnosis not present

## 2015-02-03 DIAGNOSIS — R262 Difficulty in walking, not elsewhere classified: Secondary | ICD-10-CM | POA: Diagnosis not present

## 2015-02-03 DIAGNOSIS — I4821 Permanent atrial fibrillation: Secondary | ICD-10-CM | POA: Diagnosis present

## 2015-02-03 LAB — CBC WITH DIFFERENTIAL/PLATELET
BASOS ABS: 0 10*3/uL (ref 0.0–0.1)
BASOS PCT: 0 %
EOS ABS: 0.1 10*3/uL (ref 0.0–0.7)
Eosinophils Relative: 1 %
HCT: 28.6 % — ABNORMAL LOW (ref 36.0–46.0)
HEMOGLOBIN: 9.4 g/dL — AB (ref 12.0–15.0)
Lymphocytes Relative: 20 %
Lymphs Abs: 1.2 10*3/uL (ref 0.7–4.0)
MCH: 29.3 pg (ref 26.0–34.0)
MCHC: 32.9 g/dL (ref 30.0–36.0)
MCV: 89.1 fL (ref 78.0–100.0)
Monocytes Absolute: 0.8 10*3/uL (ref 0.1–1.0)
Monocytes Relative: 13 %
NEUTROS ABS: 4.2 10*3/uL (ref 1.7–7.7)
NEUTROS PCT: 66 %
Platelets: 153 10*3/uL (ref 150–400)
RBC: 3.21 MIL/uL — AB (ref 3.87–5.11)
RDW: 15.1 % (ref 11.5–15.5)
WBC: 6.2 10*3/uL (ref 4.0–10.5)

## 2015-02-03 LAB — COMPREHENSIVE METABOLIC PANEL
ALBUMIN: 2.5 g/dL — AB (ref 3.5–5.0)
ALK PHOS: 80 U/L (ref 38–126)
ALT: 8 U/L — AB (ref 14–54)
ANION GAP: 9 (ref 5–15)
AST: 20 U/L (ref 15–41)
BUN: 17 mg/dL (ref 6–20)
CALCIUM: 8.4 mg/dL — AB (ref 8.9–10.3)
CO2: 24 mmol/L (ref 22–32)
Chloride: 99 mmol/L — ABNORMAL LOW (ref 101–111)
Creatinine, Ser: 1.32 mg/dL — ABNORMAL HIGH (ref 0.44–1.00)
GFR calc Af Amer: 46 mL/min — ABNORMAL LOW (ref >60)
GFR calc non Af Amer: 40 mL/min — ABNORMAL LOW (ref >60)
GLUCOSE: 111 mg/dL — AB (ref 65–99)
Potassium: 4 mmol/L (ref 3.5–5.1)
SODIUM: 132 mmol/L — AB (ref 135–145)
Total Bilirubin: 0.7 mg/dL (ref 0.3–1.2)
Total Protein: 5.8 g/dL — ABNORMAL LOW (ref 6.5–8.1)

## 2015-02-03 LAB — RAPID URINE DRUG SCREEN, HOSP PERFORMED
AMPHETAMINES: NOT DETECTED
BARBITURATES: NOT DETECTED
Benzodiazepines: POSITIVE — AB
Cocaine: NOT DETECTED
Opiates: POSITIVE — AB
TETRAHYDROCANNABINOL: NOT DETECTED

## 2015-02-03 LAB — URINALYSIS, ROUTINE W REFLEX MICROSCOPIC
BILIRUBIN URINE: NEGATIVE
Glucose, UA: NEGATIVE mg/dL
HGB URINE DIPSTICK: NEGATIVE
KETONES UR: 15 mg/dL — AB
Leukocytes, UA: NEGATIVE
Nitrite: NEGATIVE
PROTEIN: 30 mg/dL — AB
Specific Gravity, Urine: 1.017 (ref 1.005–1.030)
UROBILINOGEN UA: 1 mg/dL (ref 0.0–1.0)
pH: 6 (ref 5.0–8.0)

## 2015-02-03 LAB — MAGNESIUM: Magnesium: 1.9 mg/dL (ref 1.7–2.4)

## 2015-02-03 LAB — PROTIME-INR
INR: 2.33 — ABNORMAL HIGH (ref 0.00–1.49)
PROTHROMBIN TIME: 25.3 s — AB (ref 11.6–15.2)

## 2015-02-03 LAB — APTT: aPTT: 62 seconds — ABNORMAL HIGH (ref 24–37)

## 2015-02-03 LAB — URINE MICROSCOPIC-ADD ON: WBC UA: NONE SEEN WBC/hpf (ref <3)

## 2015-02-03 LAB — GLUCOSE, CAPILLARY: Glucose-Capillary: 75 mg/dL (ref 65–99)

## 2015-02-03 MED ORDER — SODIUM CHLORIDE 0.9 % IJ SOLN
3.0000 mL | Freq: Two times a day (BID) | INTRAMUSCULAR | Status: DC
  Administered 2015-02-03: 3 mL via INTRAVENOUS

## 2015-02-03 MED ORDER — ONDANSETRON HCL 4 MG/2ML IJ SOLN: 4.0000 mg | Freq: Four times a day (QID) | INTRAMUSCULAR | Status: DC | PRN

## 2015-02-03 MED ORDER — AMIODARONE HCL 200 MG PO TABS
200.0000 mg | ORAL_TABLET | Freq: Every day | ORAL | Status: DC
  Administered 2015-02-04: 200 mg via ORAL
  Filled 2015-02-03: qty 1

## 2015-02-03 MED ORDER — DULOXETINE HCL 60 MG PO CPEP
60.0000 mg | ORAL_CAPSULE | Freq: Every day | ORAL | Status: DC
  Administered 2015-02-04: 60 mg via ORAL
  Filled 2015-02-03: qty 1

## 2015-02-03 MED ORDER — PRAVASTATIN SODIUM 40 MG PO TABS: 40.0000 mg | ORAL_TABLET | Freq: Every day | ORAL | Status: DC

## 2015-02-03 MED ORDER — NITROGLYCERIN 0.4 MG SL SUBL: 0.4000 mg | SUBLINGUAL_TABLET | SUBLINGUAL | Status: DC | PRN

## 2015-02-03 MED ORDER — ONDANSETRON HCL 4 MG PO TABS: 4.0000 mg | ORAL_TABLET | Freq: Four times a day (QID) | ORAL | Status: DC | PRN

## 2015-02-03 MED ORDER — WARFARIN SODIUM 5 MG PO TABS
5.0000 mg | ORAL_TABLET | Freq: Every day | ORAL | Status: DC
  Administered 2015-02-03: 5 mg via ORAL
  Filled 2015-02-03: qty 1

## 2015-02-03 MED ORDER — SODIUM CHLORIDE 0.9 % IJ SOLN
3.0000 mL | Freq: Two times a day (BID) | INTRAMUSCULAR | Status: DC
  Administered 2015-02-03 – 2015-02-04 (×2): 3 mL via INTRAVENOUS

## 2015-02-03 MED ORDER — PANTOPRAZOLE SODIUM 40 MG PO TBEC
40.0000 mg | DELAYED_RELEASE_TABLET | Freq: Every day | ORAL | Status: DC
Start: 2015-02-04 — End: 2015-02-04
  Administered 2015-02-04: 40 mg via ORAL
  Filled 2015-02-03: qty 1

## 2015-02-03 MED ORDER — ALUM & MAG HYDROXIDE-SIMETH 200-200-20 MG/5ML PO SUSP: 30.0000 mL | Freq: Four times a day (QID) | ORAL | Status: DC | PRN

## 2015-02-03 MED ORDER — OXYCODONE HCL 5 MG PO TABS
5.0000 mg | ORAL_TABLET | ORAL | Status: DC | PRN
  Administered 2015-02-04: 5 mg via ORAL
  Filled 2015-02-03: qty 1

## 2015-02-03 MED ORDER — SODIUM CHLORIDE 0.9 % IJ SOLN: 3.0000 mL | INTRAMUSCULAR | Status: DC | PRN

## 2015-02-03 MED ORDER — VITAMIN D3 25 MCG (1000 UNIT) PO TABS
2000.0000 [IU] | ORAL_TABLET | Freq: Every day | ORAL | Status: DC
  Administered 2015-02-04: 2000 [IU] via ORAL
  Filled 2015-02-03 (×2): qty 2

## 2015-02-03 MED ORDER — ACETAMINOPHEN 650 MG RE SUPP: 650.0000 mg | Freq: Four times a day (QID) | RECTAL | Status: DC | PRN

## 2015-02-03 MED ORDER — INSULIN ASPART 100 UNIT/ML ~~LOC~~ SOLN: 0.0000 [IU] | Freq: Every day | SUBCUTANEOUS | Status: DC

## 2015-02-03 MED ORDER — LORATADINE 10 MG PO TABS
10.0000 mg | ORAL_TABLET | Freq: Every day | ORAL | Status: DC
  Administered 2015-02-04: 10 mg via ORAL
  Filled 2015-02-03: qty 1

## 2015-02-03 MED ORDER — DILTIAZEM HCL ER COATED BEADS 180 MG PO CP24
180.0000 mg | ORAL_CAPSULE | Freq: Two times a day (BID) | ORAL | Status: DC
  Administered 2015-02-03 – 2015-02-04 (×2): 180 mg via ORAL
  Filled 2015-02-03 (×3): qty 1

## 2015-02-03 MED ORDER — PREGNENOLONE MICRONIZED POWD: 1.0000 | Freq: Every day | Status: DC

## 2015-02-03 MED ORDER — ACETAMINOPHEN 325 MG PO TABS: 650.0000 mg | ORAL_TABLET | Freq: Four times a day (QID) | ORAL | Status: DC | PRN

## 2015-02-03 MED ORDER — WARFARIN - PHYSICIAN DOSING INPATIENT: Freq: Every day | Status: DC

## 2015-02-03 MED ORDER — HYDROMORPHONE HCL 1 MG/ML IJ SOLN: 0.5000 mg | INTRAMUSCULAR | Status: DC | PRN

## 2015-02-03 MED ORDER — FUROSEMIDE 20 MG PO TABS
20.0000 mg | ORAL_TABLET | Freq: Every day | ORAL | Status: DC
  Administered 2015-02-04: 20 mg via ORAL
  Filled 2015-02-03: qty 1

## 2015-02-03 MED ORDER — SODIUM CHLORIDE 0.9 % IV SOLN: 250.0000 mL | INTRAVENOUS | Status: DC | PRN

## 2015-02-03 MED ORDER — INSULIN ASPART 100 UNIT/ML ~~LOC~~ SOLN
0.0000 [IU] | Freq: Three times a day (TID) | SUBCUTANEOUS | Status: DC
  Administered 2015-02-04: 1 [IU] via SUBCUTANEOUS

## 2015-02-03 NOTE — ED Provider Notes (Signed)
CSN: [id number]     Arrival date & time 02/03/15  1333 History   First MD Initiated Contact with Patient 02/03/15 1344     Chief Complaint  Patient presents with  . Shoulder Pain  . Fatigue     (Consider location/radiation/quality/duration/timing/severity/associated sxs/prior Treatment) Patient is a 69 y.o. female presenting with shoulder pain. The history is provided by the patient, medical records and a relative. No language interpreter was used.  Shoulder Pain Associated symptoms: no back pain and no neck pain    [name] is a 69 y.o. female  with a PMH of CAD, HLD, OA, and other medical conditions presents to the Emergency Department complaining of "just not feeling well". She had surgery on her right shoulder for a proximal humerus fx on 11/10 and was discharged to home yesterday. Per family at bedside, she was not ready for discharge and not able to ambulate on her own. She feels no worse than at discharge yesterday. Family states she has had hallucinations over the last 24 hours including thinking a mouse was on her lap, asking where her mother is, who has passed away. This is new, and she has no hx of similar incidents. Only new medication is Valium. Family states she was confused and having these issues prior to taking medication today.   Of note, when asked what their main concern was today, family states weakness and mental changes. She is having shoulder pain, but says this is 2/2 recent surgery, not new, and not her concern at this time.   Past Medical History  Diagnosis Date  . DYSLIPIDEMIA 02/27/2009  . DEPRESSION 06/13/2008  . ALLERGIC RHINITIS 08/01/2009  . ABDOMINAL PAIN RIGHT UPPER QUADRANT 11/08/2008  . CARDIOVASCULAR FUNCTION STUDY, ABNORMAL 04/04/2009  . NON-HODGKIN'S LYMPHOMA, HX OF dx  2005--  chemoradiation completed 2006--  no recurrence    onocologist- dr odogwu--   . OSTEOARTHRITIS, MODERATE 04/07/2010  . CAD, NATIVE VESSEL 05/30/2009    cardiologist- dr  Aundra Dubin- visit 04-13-2010 in epic-- denies cardiac symptoms  . DYSPNEA 02/27/2009  . Retroperitoneal fibrosis   . Frequency of urination   . History of Lyme disease   . History of Bell's palsy   . DDD (degenerative disc disease), lumbar   . GERD (gastroesophageal reflux disease)   . Anxiety   . 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 History of DVT of lower extremity 2006-- CHRONIC COUMADIN THERAPY  . Anticoagulated on Coumadin   . A-fib [facility])    Past Surgical History  Procedure Laterality Date  . Multiple cysto/ left ureteral stent exchanges  LAST ONE 10-28-10  . Total knee arthroplasty  OCT 2010    RIGHT  . Total knee arthroplasty  JAN 2010    LEFT  . Knee arthroscopy  2005    RIGHT  . Breast mass excision      BENIGN-- RIGHT BREAST  . Retroperitoneal bx  2007  . Exploratory laparotomy  2005    LYMPHOMA  . Tonsillectomy  CHILD  . Cardiovascular stress test  04-04-2009-- PER PT ASYMPTOMATIC-- MEDICAL MANAGEMENT    STUDY WAS EQUIVOCAL/ EF 49%/ GLOBAL HYPOKINESIS/ APPEARS TO BE A MILD ANTERIOR PERFUSION DEFECT COULD REPRESENT ISCHEMIA BUT DUE TO  ACTIVITY WORSE IN STRESS THAN AT REST POSS. THE DEFECT WAS ARTIFACTUAL  . Transthoracic echocardiogram  [id number]    MILD SYSTOLIC DYSFUNCTION/ EF [id number] MODERATE DIASTOLIC DYSFUCTION/ MILD MR/ MILD BIATRIAL ENLARGEMENT  . Ct angiogram  FEB 2011    CALCIUM  SCORE 161/ POSS. MODERATE MID LAD STENOSIS  . Cystoscopy w/ ureteral stent removal  04/28/2011    Procedure: CYSTOSCOPY WITH STENT REMOVAL;  Surgeon: Fredricka Bonine, MD;  Location: [facility];  Service: Urology;;  . Cystoscopy w/ retrogrades  04/28/2011    Procedure: CYSTOSCOPY WITH RETROGRADE PYELOGRAM;  Surgeon: Fredricka Bonine, MD;  Location: [facility];  Service: Urology;  Laterality: Left;   Family History  Problem Relation Age of Onset  . Arthritis Mother   . Heart disease Mother   . Diabetes  Sister   . Hypertension Sister   . Stroke Sister   . Heart attack Mother   . Heart attack Sister    Social History  Substance Use Topics  . Smoking status: Former Smoker -- 2.00 packs/day for 52 years    Types: Cigarettes    Quit date: 03/24/2008  . Smokeless tobacco: Never Used  . Alcohol Use: Yes     Comment: RARE   OB History    No data available     Review of Systems  HENT: Negative for congestion, rhinorrhea and sore throat.   Eyes: Negative for visual disturbance.  Respiratory: Negative for cough, shortness of breath and wheezing.   Cardiovascular: Negative.   Gastrointestinal: Negative for nausea, vomiting, abdominal pain, diarrhea and constipation.  Musculoskeletal: Negative for myalgias, back pain, arthralgias and neck pain.  Skin: Negative for rash.  Neurological: Positive for weakness. Negative for dizziness, numbness and headaches.  Psychiatric/Behavioral: Positive for hallucinations and confusion.      Allergies  Chlorhexidine base and Penicillins  Home Medications   Prior to Admission medications   Medication Sig Start Date End Date Taking? Authorizing Provider  amiodarone (PACERONE) 200 MG tablet Take 1 tablet (200 mg total) by mouth daily. 01/16/15   Larey Dresser, MD  b complex vitamins capsule Take 2 capsules by mouth daily.     Historical Provider, MD  buPROPion (WELLBUTRIN XL) 300 MG 24 hr tablet take 1 tablet by mouth once daily 09/25/14   [name], MD  Cholecalciferol (VITAMIN D) 2000 UNITS CAPS Take 2,000 Int'l Units by mouth.    Historical Provider, MD  Coenzyme Q10-Fish Oil-Vit E (CO-Q 10 OMEGA-3 FISH OIL PO) Take 100 mg elemental calcium/kg/hr by mouth daily.    Historical Provider, MD  diazepam (VALIUM) 5 MG tablet Take 0.5 tablets (2.5 mg total) by mouth every 6 (six) hours as needed for muscle spasms or sedation. 02/01/15   Olivia Mackie Shuford, PA-C  Diclofenac Sodium (PENNSAID) 2 % SOLN Place 1 application onto the skin 3 (three) times  daily as needed. 06/01/14   [name], MD  diltiazem (CARDIZEM CD) 180 MG 24 hr capsule take 1 capsule by mouth twice a day 01/16/15   Larey Dresser, MD  DULoxetine (CYMBALTA) 60 MG capsule take 1 capsule by mouth once daily 10/22/14   [name], MD  fluticasone (FLONASE) 50 MCG/ACT nasal spray instill 2 sprays into each nostril once daily Patient taking differently: instill 2 sprays into each nostril once daily as needed 03/15/13   [name], MD  furosemide (LASIX) 20 MG tablet Take 1 tablet (20 mg total) by mouth daily. 11/15/14   Larey Dresser, MD  loratadine (CLARITIN) 10 MG tablet Take 10 mg by mouth daily.     Historical Provider, MD  losartan (COZAAR) 25 MG tablet Take 1 tablet (25 mg total) by mouth daily. 06/08/14   Larey Dresser, MD  Lysine 500 MG TABS Take 500 mg by mouth daily.    Historical Provider, MD  NATURE-THROID 113.75 MG TABS Take 1 tablet by mouth daily. 08/29/14   Historical Provider, MD  NITROSTAT 0.4 MG SL tablet place 1 tablet under the tongue every 5 minutes for UP TO 3 doses if needed 12/29/13   Larey Dresser, MD  oxyCODONE (ROXICODONE) 5 MG immediate release tablet Take 1 tablet (5 mg total) by mouth every 4 (four) hours as needed for severe pain (take 5-10 mg as needed for pain). 01/18/15   Dorothyann Peng, NP  oxyCODONE-acetaminophen (PERCOCET) 5-325 MG tablet Take 1-2 tablets by mouth every 4 (four) hours as needed. 02/01/15   Olivia Mackie Shuford, PA-C  Pitavastatin Calcium (LIVALO) 2 MG TABS take 1 tablet by mouth once daily 09/14/14   Burnell Blanks, MD  Pregnenolone Micronized POWD Take 1 capsule by mouth daily.     Historical Provider, MD  warfarin (COUMADIN) 5 MG tablet Take 5 mg daily except 7.5 mg on MWF or as directed Patient taking differently: Take 5 mg by mouth daily.  01/25/14   Adrena E Johnson, PA-C   BP 112/65 mmHg  Pulse 81  Temp(Src) 98.3 F (36.8 C) (Oral)  Resp 20  Ht 5\' 6"  (1.676 m)  Wt 224 lb (101.606 kg)  BMI 36.17  kg/m2  SpO2 94% Physical Exam  Constitutional: She is oriented to person, place, and time. She appears well-developed and well-nourished.  Alert and in no acute distress  HENT:  Head: Normocephalic and atraumatic.  Cardiovascular: Normal rate, regular rhythm, normal heart sounds and intact distal pulses.  Exam reveals no gallop and no friction rub.   No murmur heard. Pulmonary/Chest: Effort normal and breath sounds normal. No respiratory distress. She has no wheezes. She has no rales. She exhibits no tenderness.  Abdominal: She exhibits no mass. There is no rebound and no guarding.  Abdomen soft, non-tender, non-distended Bowel sounds positive in all four quadrants  Musculoskeletal:  Right UE in bandage and sling. Mild swelling of R hand No LE edema  Neurological: She is alert and oriented to person, place, and time.  Alert, oriented, thought content appropriate, able to give a coherent history. Speech is clear and goal oriented, able to follow commands.   5/5 muscle strength in upper and lower extremities bilaterally including strong and equal grip strength and dorsiflexion/plantar flexion Sensory to light touch normal in all four extremities.  Normal finger-to-nose and rapid alternating movements   Skin: Skin is warm and dry. No rash noted.  Psychiatric: She has a normal mood and affect. Her behavior is normal. Judgment and thought content normal.  Nursing note and vitals reviewed.   ED Course  Procedures (including critical care time) Labs Review Labs Reviewed - No data to display  Imaging Review No results found. I have personally reviewed and evaluated these images and lab results as part of my medical decision-making.   EKG Interpretation None      MDM   Final diagnoses:  None   [name] presents for generalized weakness and confusion. Family states patient is unable to ambulate on her own, and they had to call firemen last night for help moving pt. Into  bed at home. New onset of AMS, will rule out infectious causes.   Labs: CBC white count 6.2, H&H consistent with previous results; CMP: na 132, cl 99, glucose 111, Cr 1.32, ca 8.4, protein 5.8, alb 2.5, ALT 8, GFR 40 Mag wdl UA  shows no sign of infxn.  UDS + for benzos and opiates, which she has been prescribed s/p surgery.  Head CT shows no acute intracranial findings.   Patient has shown signs of waxing and waning delirium on re-evaluation, seems to be worsening throughout ED course. Family denies any previous alcohol use prior to surgery.  Will admit patient to hospitalist for further evaluation and treatment.  Patient seen by and discussed with Dr. Billy Fischer who agrees with treatment plan.      [facility], PA-C 02/03/15 2012  Gareth Morgan, MD 02/04/15 [phone]

## 2015-02-03 NOTE — H&P (Signed)
[facility] Admission History and Physical       Mathew Coscarelli [id number] DOB: 1945/10/12 DOA: 02/03/2015  Referring physician: EDP PCP: [name], MD  Specialists:   Chief Complaint:  Confusion and Weakness  HPI: [name] is a 69 y.o. female who was discharged home yesterday after a Total Shoulder Arthoplasty of a Complex  Fracture of the Right Proximal Humerus on 01/31/2015.   Per her family she has had increased confusion and weakness and has been unable to walk due to weakness.   She was evaluated in the ED, and is being sent for a CT scan of the Head.   She has been referred for further evaluation and possible placement at a [facility]     Review of Systems: Unable to Obtain from the Patient Constitutional: No Weight Loss, No Weight Gain, Night Sweats, Fevers, Chills, Dizziness, Light Headedness, Fatigue, +Generalized Weakness HEENT: No Headaches, Difficulty Swallowing,Tooth/Dental Problems,Sore Throat,  No Sneezing, Rhinitis, Ear Ache, Nasal Congestion, or Post Nasal Drip,  Cardio-vascular:  No Chest pain, Orthopnea, PND, Edema in Lower Extremities, Anasarca, Dizziness, Palpitations  Resp: No Dyspnea, No DOE, No Productive Cough, No Non-Productive Cough, No Hemoptysis, No Wheezing.    GI: No Heartburn, Indigestion, Abdominal Pain, Nausea, Vomiting, Diarrhea, Constipation, Hematemesis, Hematochezia, Melena, Change in Bowel Habits,  Loss of Appetite  GU: No Dysuria, No Change in Color of Urine, No Urgency or Urinary Frequency, No Flank pain.  Musculoskeletal: No Joint Pain or Swelling, No Decreased Range of Motion, No Back Pain.  Neurologic: No Syncope, No Seizures, Muscle Weakness, Paresthesia, Vision Disturbance or Loss, No Diplopia, No Vertigo, No Difficulty Walking,  Skin: No Rash or Lesions. Psych: No Change in Mood or Affect, No Depression or Anxiety, No Memory loss, +Confusion, or Hallucinations   Past Medical History  Diagnosis Date  .  DYSLIPIDEMIA 02/27/2009  . DEPRESSION 06/13/2008  . ALLERGIC RHINITIS 08/01/2009  . ABDOMINAL PAIN RIGHT UPPER QUADRANT 11/08/2008  . CARDIOVASCULAR FUNCTION STUDY, ABNORMAL 04/04/2009  . NON-HODGKIN'S LYMPHOMA, HX OF dx  2005--  chemoradiation completed 2006--  no recurrence    onocologist- dr odogwu--   . OSTEOARTHRITIS, MODERATE 04/07/2010  . CAD, NATIVE VESSEL 05/30/2009    cardiologist- dr Aundra Dubin- visit 04-13-2010 in epic-- denies cardiac symptoms  . DYSPNEA 02/27/2009  . Retroperitoneal fibrosis   . Frequency of urination   . History of Lyme disease   . History of Bell's palsy   . DDD (degenerative disc disease), lumbar   . GERD (gastroesophageal reflux disease)   . Anxiety   . 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 History of DVT of lower extremity 2006-- CHRONIC COUMADIN THERAPY  . Anticoagulated on Coumadin   . A-fib [facility])      Past Surgical History  Procedure Laterality Date  . Multiple cysto/ left ureteral stent exchanges  LAST ONE 10-28-10  . Total knee arthroplasty  OCT 2010    RIGHT  . Total knee arthroplasty  JAN 2010    LEFT  . Knee arthroscopy  2005    RIGHT  . Breast mass excision      BENIGN-- RIGHT BREAST  . Retroperitoneal bx  2007  . Exploratory laparotomy  2005    LYMPHOMA  . Tonsillectomy  CHILD  . Cardiovascular stress test  04-04-2009-- PER PT ASYMPTOMATIC-- MEDICAL MANAGEMENT    STUDY WAS EQUIVOCAL/ EF 49%/ GLOBAL HYPOKINESIS/ APPEARS TO BE A MILD ANTERIOR PERFUSION DEFECT COULD REPRESENT ISCHEMIA BUT DUE TO  ACTIVITY WORSE IN  STRESS THAN AT REST POSS. THE DEFECT WAS ARTIFACTUAL  . Transthoracic echocardiogram  [id number]    MILD SYSTOLIC DYSFUNCTION/ EF [id number] MODERATE DIASTOLIC DYSFUCTION/ MILD MR/ MILD BIATRIAL ENLARGEMENT  . Ct angiogram  FEB 2011    CALCIUM SCORE 161/ POSS. MODERATE MID LAD STENOSIS  . Cystoscopy w/ ureteral stent removal  04/28/2011    Procedure: CYSTOSCOPY WITH STENT REMOVAL;  Surgeon: Fredricka Bonine, MD;  Location: [facility];  Service: Urology;;  . Cystoscopy w/ retrogrades  04/28/2011    Procedure: CYSTOSCOPY WITH RETROGRADE PYELOGRAM;  Surgeon: Fredricka Bonine, MD;  Location: [facility];  Service: Urology;  Laterality: Left;      Prior to Admission medications   Medication Sig Start Date End Date Taking? Authorizing Provider  ALPRAZolam Duanne Moron) 1 MG tablet Take 1 tablet by mouth 3 (three) times daily as needed for anxiety.  01/29/15  Yes Historical Provider, MD  amiodarone (PACERONE) 200 MG tablet Take 1 tablet (200 mg total) by mouth daily. 01/16/15  Yes Larey Dresser, MD  b complex vitamins capsule Take 2 capsules by mouth daily.    Yes Historical Provider, MD  buPROPion (WELLBUTRIN XL) 300 MG 24 hr tablet take 1 tablet by mouth once daily 09/25/14  Yes [name], MD  Cholecalciferol (VITAMIN D) 2000 UNITS CAPS Take 2,000 Int'l Units by mouth.   Yes Historical Provider, MD  Coenzyme Q10-Fish Oil-Vit E (CO-Q 10 OMEGA-3 FISH OIL PO) Take 100 mg elemental calcium/kg/hr by mouth daily.   Yes Historical Provider, MD  diazepam (VALIUM) 5 MG tablet Take 0.5 tablets (2.5 mg total) by mouth every 6 (six) hours as needed for muscle spasms or sedation. 02/01/15  Yes Tracy Shuford, PA-C  diltiazem (CARDIZEM CD) 180 MG 24 hr capsule take 1 capsule by mouth twice a day 01/16/15  Yes Larey Dresser, MD  DULoxetine (CYMBALTA) 60 MG capsule take 1 capsule by mouth once daily 10/22/14  Yes [name], MD  fluticasone (FLONASE) 50 MCG/ACT nasal spray instill 2 sprays into each nostril once daily Patient taking differently: instill 2 sprays into each nostril once daily as needed 03/15/13  Yes [name], MD  loratadine (CLARITIN) 10 MG tablet Take 10 mg by mouth daily.    Yes Historical Provider, MD  losartan (COZAAR) 25 MG tablet Take 1 tablet (25 mg total) by mouth daily. 06/08/14  Yes Larey Dresser, MD  Lysine 500 MG TABS Take  500 mg by mouth daily.   Yes Historical Provider, MD  NATURE-THROID 113.75 MG TABS Take 1 tablet by mouth daily. 08/29/14  Yes Historical Provider, MD  NITROSTAT 0.4 MG SL tablet place 1 tablet under the tongue every 5 minutes for UP TO 3 doses if needed 12/29/13  Yes Larey Dresser, MD  omeprazole (PRILOSEC) 20 MG capsule Take 20 mg by mouth daily.   Yes Historical Provider, MD  oxyCODONE-acetaminophen (PERCOCET) 5-325 MG tablet Take 1-2 tablets by mouth every 4 (four) hours as needed. 02/01/15  Yes Tracy Shuford, PA-C  Pitavastatin Calcium (LIVALO) 2 MG TABS take 1 tablet by mouth once daily 09/14/14  Yes Burnell Blanks, MD  Pregnenolone Micronized POWD Take 1 capsule by mouth daily.    Yes Historical Provider, MD  warfarin (COUMADIN) 5 MG tablet Take 5 mg daily except 7.5 mg on MWF or as directed Patient taking differently: Take 5 mg by mouth daily.  01/25/14  Yes Carlton Adam, PA-C  Diclofenac Sodium (  PENNSAID) 2 % SOLN Place 1 application onto the skin 3 (three) times daily as needed. 06/01/14   [name], MD  furosemide (LASIX) 20 MG tablet Take 1 tablet (20 mg total) by mouth daily. 11/15/14   Larey Dresser, MD  oxyCODONE (ROXICODONE) 5 MG immediate release tablet Take 1 tablet (5 mg total) by mouth every 4 (four) hours as needed for severe pain (take 5-10 mg as needed for pain). 01/18/15   Dorothyann Peng, NP     Allergies  Allergen Reactions  . Chlorhexidine Base Itching    CHG WIPES  . Penicillins Hives and Rash    Social History:  reports that she quit smoking about 6 years ago. Her smoking use included Cigarettes. She has a 104 pack-year smoking history. She has never used smokeless tobacco. She reports that she drinks alcohol. She reports that she does not use illicit drugs.    Family History  Problem Relation Age of Onset  . Arthritis Mother   . Heart disease Mother   . Diabetes Sister   . Hypertension Sister   . Stroke Sister   . Heart attack Mother   .  Heart attack Sister        Physical Exam:  GEN:  Pleasant Obese Elderly  69 y.o. Caucasian female examined and in no acute distress; cooperative with exam Filed Vitals:   02/03/15 1615 02/03/15 1756 02/03/15 1800 02/03/15 1815  BP: 102/58 120/77 123/68   Pulse: 89 82  92  Temp:      TempSrc:      Resp:  22 22 21   Height:      Weight:      SpO2: 97% 94%  94%   Blood pressure 123/68, pulse 92, temperature 98.3 F (36.8 C), temperature source Oral, resp. rate 21, height 5\' 6"  (1.676 m), weight 101.606 kg (224 lb), SpO2 94 %. PSYCH: She is alert and oriented x4; does not appear anxious does not appear depressed; affect is normal HEENT: Normocephalic and Atraumatic, Mucous membranes pink; PERRLA; EOM intact; Fundi:  Benign;  No scleral icterus, Nares: Patent, Oropharynx: Clear, Poor Dentition,    Neck:  FROM, No Cervical Lymphadenopathy nor Thyromegaly or Carotid Bruit; No JVD; Breasts:: Not examined CHEST WALL: No tenderness CHEST: Normal respiration, clear to auscultation bilaterally HEART: Regular rate and rhythm; no murmurs rubs or gallops BACK: No kyphosis or scoliosis; No CVA tenderness ABDOMEN: Positive Bowel Sounds, Obese, Soft Non-Tender, No Rebound or Guarding; No Masses, No Organomegaly. Rectal Exam: Not done EXTREMITIES: Right Upper Extremity in Sling;   No Cyanosis, Clubbing, or Edema; No Ulcerations. Genitalia: not examined PULSES: 2+ and symmetric SKIN: Normal hydration no rash or ulceration CNS:  Alert and Oriented x 4, No Focal Deficits Vascular: pulses palpable throughout    Labs on Admission:  Basic Metabolic Panel:  Recent Labs Lab 01/31/15 0905 02/03/15 1552  NA 139 132*  K 4.1 4.0  CL  --  99*  CO2  --  24  GLUCOSE 145* 111*  BUN  --  17  CREATININE  --  1.32*  CALCIUM  --  8.4*  MG  --  1.9   Liver Function Tests:  Recent Labs Lab 02/03/15 1552  AST 20  ALT 8*  ALKPHOS 80  BILITOT 0.7  PROT 5.8*  ALBUMIN 2.5*   No results for  input(s): LIPASE, AMYLASE in the last 168 hours. No results for input(s): AMMONIA in the last 168 hours. CBC:  Recent Labs Lab 01/31/15  [id number] 02/01/15 0540 02/03/15 1552  WBC  --   --  6.2  NEUTROABS  --   --  4.2  HGB 9.9* 9.3* 9.4*  HCT 29.0* 28.6* 28.6*  MCV  --   --  89.1  PLT  --   --  153   Cardiac Enzymes: No results for input(s): CKTOTAL, CKMB, CKMBINDEX, TROPONINI in the last 168 hours.  BNP (last 3 results) No results for input(s): BNP in the last 8760 hours.  ProBNP (last 3 results)  Recent Labs  05/29/14 1448  PROBNP 168.0*    CBG: No results for input(s): GLUCAP in the last 168 hours.  Radiological Exams on Admission: Dg Chest 2 View  02/03/2015  CLINICAL DATA:  Altered mental status. EXAM: CHEST  2 VIEW COMPARISON:  PET-CT 03/28/2012 FINDINGS: Lungs are adequately inflated with mild elevation of the right hemidiaphragm. No focal consolidation or effusion. Mild stable cardiomegaly. Mild calcified plaque over the aortic arch. Right shoulder prosthesis is present. Loose body over the inferior left glenohumeral joint. Mild degenerate change of the spine. IMPRESSION: No acute cardiopulmonary disease. Electronically Signed   By: Marin Olp M.D.   On: 02/03/2015 17:11      EXAM: CT HEAD WITHOUT CONTRAST  TECHNIQUE: Contiguous axial images were obtained from the base of the skull through the vertex without intravenous contrast.  COMPARISON: 03/28/2012  FINDINGS: Hypodensity compatible with remote small infarct in the right inferior cerebellum, image 4 series 2, no change from 03/28/2012.  Chronic appearing 0.9 by 0.5 cm lacunar infarct in the right thalamus, image 15 series 2.  Periventricular white matter and corona radiata hypodensities favor chronic ischemic microvascular white matter disease.  Right parietal vertex scalp lesion, image 30 series 2, nonspecific but probably chronic.  No intracranial hemorrhage, mass lesion, or acute  CVA.  Chronic right maxillary sinusitis. Mild chronic ethmoid sinusitis.  There is atherosclerotic calcification of the cavernous carotid arteries bilaterally.  IMPRESSION: 1. No acute intracranial findings. 2. 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 3. Chronic ethmoid and right maxillary sinusitis. 4. Right parietal vertex scalp lesion, probably a chronic/incidental, but correlate with any new injury in this vicinity in assessing whether this could possibly be a scalp hematoma. 5. Periventricular white matter and corona radiata hypodensities favor chronic ischemic microvascular white matter disease.   Electronically Signed  By: Van Clines M.D.  On: 02/03/2015 19:54     EKG: Independently reviewed. Atrial Fibrillation rate = 89   Assessment/Plan:  69 y.o. female with  Principal Problem:   1.     Acute encephalopathy- Possibly Due to Medications, or from Anesthesia, rule out Acute CNS event   Cardiac Monitoring   Neuro Checks   Continue Pain Control     but hold Sedating meds   Ct scan Head revealed +Right Parietal Scalp Hematoma but No ICH or Acute Intracranial Findings   Active Problems:   2.     Hematoma of right parietal scalp- Most Likely from Fall that Fractured Right Shoulder on 01/31/2015    Monitor   Neuro Checks     3.     Anticoagulated on Coumadin   Monitor PT/INR daily   Pharmacy Adjustment PRN     4.     Anemia   Monitor     5.     Atrial fibrillation (HCC)   Continue Amiodarone and Diltiazem for rate control   Continue Coumadin     6.  HLD (hyperlipidemia)   Continue Statin Rx     7.     CAD, NATIVE VESSEL   Continue Statin Rx     8.     NHL (non-Hodgkin's lymphoma) (HCC)   Stable     9.     Status post reverse total shoulder replacement   Pain controll PRN   PRN IV Robaxin Rx    10.    DVT Prophylaxis   On Coumadin Rx     Code Status:     FULL  CODE        Family Communication:   No Family Present    Disposition Plan:   Observation Status        Time spent:  67 Minutes      Theressa Millard [facility] Pager [phone]   If [place] Please Contact the Day Rounding Team MD for [facility]  If 7PM-7AM, Please Contact Night-Floor Coverage  [email or web address] Password [email or web address] 02/03/2015, 7:02 PM     ADDENDUM:   Patient was seen and examined on 02/03/2015

## 2015-02-03 NOTE — ED Notes (Signed)
Phlebotomy at the bedside  

## 2015-02-03 NOTE — ED Notes (Signed)
Pt was just released from [facility] yesterday after having a shoulder surgery. Pt states she has been very tired and fatigued. Pt family states they feel as if she was d/c too soon. Pt family states she has been sleeping a lot and she confirms.

## 2015-02-04 ENCOUNTER — Encounter (HOSPITAL_COMMUNITY): Payer: Self-pay | Admitting: Orthopedic Surgery

## 2015-02-04 DIAGNOSIS — R262 Difficulty in walking, not elsewhere classified: Secondary | ICD-10-CM | POA: Diagnosis not present

## 2015-02-04 DIAGNOSIS — R51 Headache: Secondary | ICD-10-CM | POA: Diagnosis not present

## 2015-02-04 DIAGNOSIS — G9340 Encephalopathy, unspecified: Secondary | ICD-10-CM | POA: Diagnosis not present

## 2015-02-04 DIAGNOSIS — R5381 Other malaise: Secondary | ICD-10-CM | POA: Diagnosis not present

## 2015-02-04 DIAGNOSIS — I4891 Unspecified atrial fibrillation: Secondary | ICD-10-CM | POA: Diagnosis not present

## 2015-02-04 DIAGNOSIS — Z8572 Personal history of non-Hodgkin lymphomas: Secondary | ICD-10-CM | POA: Diagnosis not present

## 2015-02-04 DIAGNOSIS — I2510 Atherosclerotic heart disease of native coronary artery without angina pectoris: Secondary | ICD-10-CM | POA: Diagnosis not present

## 2015-02-04 DIAGNOSIS — N179 Acute kidney failure, unspecified: Secondary | ICD-10-CM | POA: Diagnosis not present

## 2015-02-04 DIAGNOSIS — Z87891 Personal history of nicotine dependence: Secondary | ICD-10-CM | POA: Diagnosis not present

## 2015-02-04 DIAGNOSIS — Z96653 Presence of artificial knee joint, bilateral: Secondary | ICD-10-CM | POA: Diagnosis not present

## 2015-02-04 DIAGNOSIS — Z5181 Encounter for therapeutic drug level monitoring: Secondary | ICD-10-CM | POA: Diagnosis not present

## 2015-02-04 DIAGNOSIS — M6281 Muscle weakness (generalized): Secondary | ICD-10-CM | POA: Diagnosis not present

## 2015-02-04 DIAGNOSIS — I481 Persistent atrial fibrillation: Secondary | ICD-10-CM | POA: Diagnosis not present

## 2015-02-04 DIAGNOSIS — Z880 Allergy status to penicillin: Secondary | ICD-10-CM | POA: Diagnosis not present

## 2015-02-04 DIAGNOSIS — E1165 Type 2 diabetes mellitus with hyperglycemia: Secondary | ICD-10-CM | POA: Diagnosis not present

## 2015-02-04 DIAGNOSIS — N183 Chronic kidney disease, stage 3 (moderate): Secondary | ICD-10-CM | POA: Diagnosis not present

## 2015-02-04 DIAGNOSIS — I5032 Chronic diastolic (congestive) heart failure: Secondary | ICD-10-CM

## 2015-02-04 DIAGNOSIS — Z7901 Long term (current) use of anticoagulants: Secondary | ICD-10-CM

## 2015-02-04 DIAGNOSIS — Z96611 Presence of right artificial shoulder joint: Secondary | ICD-10-CM

## 2015-02-04 DIAGNOSIS — E119 Type 2 diabetes mellitus without complications: Secondary | ICD-10-CM | POA: Diagnosis not present

## 2015-02-04 DIAGNOSIS — E785 Hyperlipidemia, unspecified: Secondary | ICD-10-CM | POA: Diagnosis not present

## 2015-02-04 DIAGNOSIS — H43811 Vitreous degeneration, right eye: Secondary | ICD-10-CM | POA: Diagnosis not present

## 2015-02-04 DIAGNOSIS — I482 Chronic atrial fibrillation: Secondary | ICD-10-CM

## 2015-02-04 DIAGNOSIS — G9349 Other encephalopathy: Secondary | ICD-10-CM | POA: Diagnosis not present

## 2015-02-04 DIAGNOSIS — J449 Chronic obstructive pulmonary disease, unspecified: Secondary | ICD-10-CM | POA: Diagnosis not present

## 2015-02-04 DIAGNOSIS — E1122 Type 2 diabetes mellitus with diabetic chronic kidney disease: Secondary | ICD-10-CM | POA: Diagnosis not present

## 2015-02-04 DIAGNOSIS — Z8673 Personal history of transient ischemic attack (TIA), and cerebral infarction without residual deficits: Secondary | ICD-10-CM | POA: Diagnosis not present

## 2015-02-04 DIAGNOSIS — R531 Weakness: Secondary | ICD-10-CM | POA: Diagnosis not present

## 2015-02-04 DIAGNOSIS — E871 Hypo-osmolality and hyponatremia: Secondary | ICD-10-CM | POA: Diagnosis not present

## 2015-02-04 DIAGNOSIS — S0003XD Contusion of scalp, subsequent encounter: Secondary | ICD-10-CM

## 2015-02-04 LAB — CBC
HCT: 27.4 % — ABNORMAL LOW (ref 36.0–46.0)
HEMOGLOBIN: 8.9 g/dL — AB (ref 12.0–15.0)
MCH: 29 pg (ref 26.0–34.0)
MCHC: 32.5 g/dL (ref 30.0–36.0)
MCV: 89.3 fL (ref 78.0–100.0)
Platelets: 159 10*3/uL (ref 150–400)
RBC: 3.07 MIL/uL — ABNORMAL LOW (ref 3.87–5.11)
RDW: 15.2 % (ref 11.5–15.5)
WBC: 4.9 10*3/uL (ref 4.0–10.5)

## 2015-02-04 LAB — BASIC METABOLIC PANEL
ANION GAP: 9 (ref 5–15)
BUN: 12 mg/dL (ref 6–20)
CALCIUM: 8.5 mg/dL — AB (ref 8.9–10.3)
CO2: 25 mmol/L (ref 22–32)
CREATININE: 0.94 mg/dL (ref 0.44–1.00)
Chloride: 100 mmol/L — ABNORMAL LOW (ref 101–111)
GFR calc Af Amer: >60 mL/min (ref >60)
GLUCOSE: 91 mg/dL (ref 65–99)
Potassium: 3.5 mmol/L (ref 3.5–5.1)
Sodium: 134 mmol/L — ABNORMAL LOW (ref 135–145)

## 2015-02-04 LAB — GLUCOSE, CAPILLARY
Glucose-Capillary: 85 mg/dL (ref 65–99)
Glucose-Capillary: 134 mg/dL — ABNORMAL HIGH (ref 65–99)

## 2015-02-04 LAB — PROTIME-INR
INR: 2.69 — AB (ref 0.00–1.49)
Prothrombin Time: 28.2 seconds — ABNORMAL HIGH (ref 11.6–15.2)

## 2015-02-04 MED ORDER — ENSURE ENLIVE PO LIQD
237.0000 mL | Freq: Two times a day (BID) | ORAL | Status: DC
  Administered 2015-02-04: 237 mL via ORAL

## 2015-02-04 MED ORDER — OXYCODONE-ACETAMINOPHEN 5-325 MG PO TABS: 1.0000 | ORAL_TABLET | ORAL | Status: DC | PRN

## 2015-02-04 MED ORDER — ENSURE ENLIVE PO LIQD: 237.0000 mL | Freq: Two times a day (BID) | ORAL | Status: DC

## 2015-02-04 MED ORDER — ALPRAZOLAM 1 MG PO TABS: 1.0000 mg | ORAL_TABLET | Freq: Three times a day (TID) | ORAL | Status: DC | PRN

## 2015-02-04 NOTE — Progress Notes (Signed)
Patient will discharge to [facility] Anticipated discharge date: 02/04/15 Family notified: at bedside Transportation by [facility]- scheduled for 2pm  CSW signing off.  Domenica Reamer, [place] Social Worker [phone]

## 2015-02-04 NOTE — Clinical Social Work Note (Signed)
Clinical Social Work Assessment  Patient Details  Name: [name] MRN: [id number] Date of Birth: 1945/09/26  Date of referral:  02/04/15               Reason for consult:  Facility Placement                Permission sought to share information with:  Family Supports, Chartered certified accountant granted to share information::  Yes, Verbal Permission Granted  Name::     Bettina Gavia  Agency::  [facility]  Relationship::  Son  Sport and exercise psychologist Information:     Housing/Transportation Living arrangements for the past 2 months:  Single Family Home Source of Information:  Patient Patient Interpreter Needed:  None Criminal Activity/Legal Involvement Pertinent to Current Situation/Hospitalization:  No - Comment as needed Significant Relationships:  Adult Children, Spouse Lives with:  Spouse, Adult Children Do you feel safe going back to the place where you live?  Yes Need for family participation in patient care:  Yes (Comment)  Care giving concerns: Patient lives with husband and son but their assistance is not sufficient at this time given current physical impairment.   Social Worker assessment / plan:  Patient recommended for SNF. Spoke with patient regarding the need for rehab prior to returning home.   Employment status:  Retired Nurse, adult PT Recommendations:  Not assessed at this time [place] / Referral to community resources:  [place]  Patient/Family's Response to care: Patient was previously discharged with home health but was unable to get out of the car until the [employer] arrived. Patient now recognizes the need for rehab before returning home.   Patient/Family's Understanding of and Emotional Response to Diagnosis, Current Treatment, and Prognosis:  Patient has no questions or concerns regarding plan.  Emotional Assessment Appearance:  Appears stated age Attitude/Demeanor/Rapport:    Affect (typically  observed):  Appropriate, Pleasant Orientation:  Oriented to Self, Oriented to Place, Oriented to  Time, Oriented to Situation Alcohol / Substance use:  Not Applicable Psych involvement (Current and /or in the community):  No (Comment)  Discharge Needs  Concerns to be addressed:  Care Coordination Readmission within the last 30 days:  Yes Current discharge risk:  Dependent with Mobility Barriers to Discharge:  No Barriers Identified   Benard Halsted, LCSW 02/04/2015, 10:46 AM

## 2015-02-04 NOTE — Progress Notes (Signed)
PROGRESS NOTE  [name] [id number] DOB: [birth year] DOA: 02/03/2015 PCP: [name], MD   Brief History 69 year old female with a history of non-Hodgkin's lymphoma, hyperlipidemia, atrial fibrillation on Coumadin, coronary artery disease, COPD, presented with increasing confusion and weakness after right total shoulder arthroplasty performed on 01/31/2015 after mechanical fall resulting in a displaced right proximal humerus fracture. The patient was discharged home from the hospital on 01/31/2015.  Even prior to discharge from the hospital, review of the medical record showed that the patient had some confusion, and required significant assistance getting out of bed. At home, the patient had increasing confusion with hallucinations. She was discharged home with Valium and Percocet. Assessment/Plan: Acute encephalopathy -Urinalysis negative for pyuria -Multifactorial including acute kidney injury as well as medications (opiods and benzodiazepines) -CT brain negative -improved  Status post right total shoulder arthroplasty -Secondary to mechanical fall that occurred approximately 2 weeks prior to the last admission (Dr. Onnie Graham) -judicious pain control -PT evaluation Acute kidney injury -Improved with fluids -Hold losartan Deconditioning, generalized weakness -PT evaluation -Plan for skilled nursing facility Hyponatremia -Secondary to volume depletion -Improved with IV fluids Persistent atrial fibrillation -Continue diltiazem and warfarin -11/15/2013 echocardiogram-EF 50-55%, grade 1 DD  -Continue amiodarone (started July 2016) Coronary artery disease  -No anginal symptoms  -February 2011--CT angiogram coronaries -moderate calcium score of 161 (putting her at a high risk percentile for her age) and a possible moderate mid LAD stenosis (though some degradation of images by respiratory artifact) -Cardiology previously discussed heart catheterization, the  patient preferred medical treatment  Chronic diastolic CHF  -Continue furosemide  -Appears euvolemic  Hyperlipidemia  -Continue statin  Diabetes mellitus type 2 -Hemoglobin A1c   Family Communication:   Pt at beside Disposition Plan:  SNF when bed available       Procedures/Studies: Dg Chest 2 View  02/03/2015  CLINICAL DATA:  Altered mental status. EXAM: CHEST  2 VIEW COMPARISON:  PET-CT 03/28/2012 FINDINGS: Lungs are adequately inflated with mild elevation of the right hemidiaphragm. No focal consolidation or effusion. Mild stable cardiomegaly. Mild calcified plaque over the aortic arch. Right shoulder prosthesis is present. Loose body over the inferior left glenohumeral joint. Mild degenerate change of the spine. IMPRESSION: No acute cardiopulmonary disease. Electronically Signed   By: Marin Olp M.D.   On: 02/03/2015 17:11   Ct Head Wo Contrast  02/03/2015  CLINICAL DATA:  Altered mental status.  Headaches.  Fall last week. EXAM: CT HEAD WITHOUT CONTRAST TECHNIQUE: Contiguous axial images were obtained from the base of the skull through the vertex without intravenous contrast. COMPARISON:  03/28/2012 FINDINGS: Hypodensity compatible with remote small infarct in the right inferior cerebellum, image 4 series 2, no change from 03/28/2012. Chronic appearing 0.9 by 0.5 cm lacunar infarct in the right thalamus, image 15 series 2. Periventricular white matter and corona radiata hypodensities favor chronic ischemic microvascular white matter disease. Right parietal vertex scalp lesion, image 30 series 2, nonspecific but probably chronic. No intracranial hemorrhage, mass lesion, or acute CVA. Chronic right maxillary sinusitis.  Mild chronic ethmoid sinusitis. There is atherosclerotic calcification of the cavernous carotid arteries bilaterally. IMPRESSION: 1. No acute intracranial findings. 2. 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 3.  Chronic ethmoid and right maxillary sinusitis. 4. Right parietal vertex scalp lesion, probably a chronic/incidental, but correlate with any new injury in this vicinity in assessing whether this could possibly be a scalp  hematoma. 5. Periventricular white matter and corona radiata hypodensities favor chronic ischemic microvascular white matter disease. Electronically Signed   By: Van Clines M.D.   On: 02/03/2015 19:54        Subjective: Patient denies fevers, chills, headache, chest pain, dyspnea, nausea, vomiting, diarrhea, abdominal pain, dysuria, hematuria   Objective: Filed Vitals:   02/03/15 1800 02/03/15 1815 02/03/15 2058 02/04/15 0656  BP: 123/68  122/59 114/68  Pulse:  92 87 64  Temp:   99.4 F (37.4 C) 97.9 F (36.6 C)  TempSrc:   Oral Oral  Resp: 22 21 18 20   Height:   5\' 6"  (1.676 m)   Weight:   104 kg (229 lb 4.5 oz)   SpO2:  94% 95% 99%    Intake/Output Summary (Last 24 hours) at 02/04/15 0909 Last data filed at 02/04/15 0845  Gross per 24 hour  Intake      0 ml  Output   1800 ml  Net  -1800 ml   Weight change:  Exam:   General:  Pt is alert, follows commands appropriately, not in acute distress  HEENT: No icterus, No thrush, No neck mass, [place]/AT  Cardiovascular: IRRR, S1/S2, no rubs, no gallops  Respiratory: CTA bilaterally, no wheezing, no crackles, no rhonchi  Abdomen: Soft/+BS, non tender, non distended, no guarding; no hepatosplenomegaly  Extremities: 1+LE edema, No lymphangitis, No petechiae, No rashes, no synovitis; no cyanosis or clubbing  Data Reviewed: Basic Metabolic Panel:  Recent Labs Lab 01/31/15 0905 02/03/15 1552 02/04/15 0544  NA 139 132* 134*  K 4.1 4.0 3.5  CL  --  99* 100*  CO2  --  24 25  GLUCOSE 145* 111* 91  BUN  --  17 12  CREATININE  --  1.32* 0.94  CALCIUM  --  8.4* 8.5*  MG  --  1.9  --    Liver Function Tests:  Recent Labs Lab 02/03/15 1552  AST 20  ALT 8*  ALKPHOS 80  BILITOT 0.7  PROT 5.8*    ALBUMIN 2.5*   No results for input(s): LIPASE, AMYLASE in the last 168 hours. No results for input(s): AMMONIA in the last 168 hours. CBC:  Recent Labs Lab 01/31/15 0905 02/01/15 0540 02/03/15 1552 02/04/15 0544  WBC  --   --  6.2 4.9  NEUTROABS  --   --  4.2  --   HGB 9.9* 9.3* 9.4* 8.9*  HCT 29.0* 28.6* 28.6* 27.4*  MCV  --   --  89.1 89.3  PLT  --   --  153 159   Cardiac Enzymes: No results for input(s): CKTOTAL, CKMB, CKMBINDEX, TROPONINI in the last 168 hours. BNP: Invalid input(s): POCBNP CBG:  Recent Labs Lab 02/03/15 2221 02/04/15 0738  GLUCAP 75 85    Recent Results (from the past 240 hour(s))  Surgical pcr screen     Status: None   Collection Time: 01/25/15  2:15 PM  Result Value Ref Range Status   MRSA, PCR NEGATIVE NEGATIVE Final   Staphylococcus aureus NEGATIVE NEGATIVE Final    Comment:        The Xpert SA Assay (FDA approved for NASAL specimens in patients over 90 years of age), is one component of a comprehensive surveillance program.  Test performance has been validated by [facility] for patients greater than or equal to 66 year old. It is not intended to diagnose infection nor to guide or monitor treatment.      Scheduled Meds: .  amiodarone  200 mg Oral Daily  . cholecalciferol  2,000 Units Oral Daily  . diltiazem  180 mg Oral BID  . DULoxetine  60 mg Oral Daily  . feeding supplement (ENSURE ENLIVE)  237 mL Oral BID BM  . furosemide  20 mg Oral Daily  . insulin aspart  0-5 Units Subcutaneous QHS  . insulin aspart  0-9 Units Subcutaneous TID WC  . loratadine  10 mg Oral Daily  . pantoprazole  40 mg Oral Daily  . pravastatin  40 mg Oral q1800  . sodium chloride  3 mL Intravenous Q12H  . sodium chloride  3 mL Intravenous Q12H  . warfarin  5 mg Oral q1800  . Warfarin - Physician Dosing Inpatient   Does not apply q1800   Continuous Infusions:    Haliyah Fryman, DO  [facility] Pager [phone]  If 7PM-7AM, please contact  night-coverage [email or web address] Password [email or web address] 02/04/2015, 9:09 AM

## 2015-02-04 NOTE — Progress Notes (Signed)
Pt. And family Educated on plan of care to go to skilled nursing facility. Called report to [place] at [facility].

## 2015-02-04 NOTE — Progress Notes (Signed)
[name]  MRN: [id number] DOB/Age: 1946-03-03 69 y.o. Physician: Ander Slade, M.D.      Subjective: Resting in bedside chair, a and O, c/o right shoulder pain. Vital Signs Temp:  [97.9 F (36.6 C)-99.4 F (37.4 C)] 97.9 F (36.6 C) (11/14 1031) Pulse Rate:  [64-92] 75 (11/14 1031) Resp:  [18-22] 18 (11/14 1031) BP: (102-123)/(51-81) 116/59 mmHg (11/14 1031) SpO2:  [92 %-99 %] 98 % (11/14 1031) Weight:  [101.606 kg (224 lb)-104 kg (229 lb 4.5 oz)] 104 kg (229 lb 4.5 oz) (11/13 2058)  Lab Results  Recent Labs  02/03/15 1552 02/04/15 0544  WBC 6.2 4.9  HGB 9.4* 8.9*  HCT 28.6* 27.4*  PLT 153 159   BMET  Recent Labs  02/03/15 1552 02/04/15 0544  NA 132* 134*  K 4.0 3.5  CL 99* 100*  CO2 24 25  GLUCOSE 111* 91  BUN 17 12  CREATININE 1.32* 0.94  CALCIUM 8.4* 8.5*   INR  Date Value Ref Range Status  02/04/2015 2.69* 0.00 - 1.49 Final  01/17/2015 3.90* 2.00 - 3.50 Final    Comment:    INR is useful only to assess adequacy of anticoagulation with coumadin when comparing results from different labs. It should not be used to estimate bleeding risk or presence/abscense of coagulopathy in patients not on coumadin. Expected INR ranges for  nontherapeutic patients is 0.88 - 1.12.   12/14/2014 2.7  Final     Exam  Moderate swelling of RUE, as noted preop. Dressing dry, grossly N/V intact RUE, instructed in sling use and edema reduction exercises  Plan Anticipate d/c to SNF today. Greatly appreciate medicine service care of Ms. Willy. Mental status improved. Will plan for follow up in my office in 2 weeks. Call if any questions   Kyngston Pickelsimer M Levon Penning 02/04/2015, 12:27 PM    Contact # [phone]

## 2015-02-04 NOTE — NC FL2 (Signed)
[place] LEVEL OF CARE SCREENING TOOL     IDENTIFICATION  Patient Name: [name] Birthdate: [birth year] Sex: female Admission Date (Current Location): 02/03/2015  [facility] and [place] Number: [employer] and Address:  [facility], [place] [address] [place], [place] [address]      Provider Number: [id number]  Attending Physician Name and Address:  Orson Eva, MD  Relative Name and Phone Number:       Current Level of Care: Hospital Recommended Level of Care: [place] Prior Approval Number:    Date Approved/Denied:   PASRR Number: [id number]  Discharge Plan: SNF    Current Diagnoses: Patient Active Problem List   Diagnosis Date Noted  . Acute encephalopathy 02/03/2015  . Hematoma of right parietal scalp 02/03/2015  . Anticoagulated on Coumadin 02/03/2015  . Anemia 02/03/2015  . Status post reverse total shoulder replacement 01/31/2015  . Chronic diastolic CHF (congestive heart failure) ([place]) 06/10/2014  . Anxiety state 04/23/2014  . Atrial fibrillation ([place]) 11/13/2013  . Mitral valve disorder 11/13/2013  . AF (paroxysmal atrial fibrillation) ([place]) 12/14/2012  . Obesity (BMI 30-39.9) 11/30/2012  . BP (high blood pressure) 11/03/2012  . Cardiomyopathy ([place]) 09/16/2011  . DVT of lower extremity (deep venous thrombosis) ([place]) 01/22/2011  . DVT, lower extremity ([place]) 01/08/2011  . Osteoarthritis 04/07/2010  . ALLERGIC RHINITIS 08/01/2009  . CAD, NATIVE VESSEL 05/30/2009  . CARDIOVASCULAR FUNCTION STUDY, ABNORMAL 05/01/2009  . HLD (hyperlipidemia) 02/27/2009  . DYSPNEA 02/27/2009  . ABDOMINAL PAIN RIGHT UPPER QUADRANT 11/08/2008  . NHL (non-Hodgkin's lymphoma) ([place]) 11/08/2008  . DEPRESSION 06/13/2008  . FATIGUE 05/30/2008    Orientation ACTIVITIES/SOCIAL BLADDER RESPIRATION    Self, Time, Situation, Place  Family supportive Continent Normal  BEHAVIORAL SYMPTOMS/MOOD NEUROLOGICAL BOWEL  NUTRITION STATUS      Continent    PHYSICIAN VISITS COMMUNICATION OF NEEDS Height & Weight Skin    Verbally   229 lbs. Surgical wounds          AMBULATORY STATUS RESPIRATION    Assist extensive Normal      Personal Care Assistance Level of Assistance  Bathing, Dressing Bathing Assistance: Maximum assistance   Dressing Assistance: Maximum assistance      Functional Limitations Info                SPECIAL CARE FACTORS FREQUENCY  PT (By licensed PT), OT (By licensed OT)     PT Frequency: 5/wk OT Frequency: 5/wk           Additional Factors Info  Allergies   Allergies Info: Chlorhexidine base, penicillins           Current Medications (02/04/2015): Current Facility-Administered Medications  Medication Dose Route Frequency Provider Last Rate Last Dose  . 0.9 %  sodium chloride infusion  250 mL Intravenous PRN Theressa Millard, MD      . acetaminophen (TYLENOL) tablet 650 mg  650 mg Oral Q6H PRN Theressa Millard, MD       Or  . acetaminophen (TYLENOL) suppository 650 mg  650 mg Rectal Q6H PRN Theressa Millard, MD      . alum & mag hydroxide-simeth (MAALOX/MYLANTA) 200-200-20 MG/5ML suspension 30 mL  30 mL Oral Q6H PRN Theressa Millard, MD      . amiodarone (PACERONE) tablet 200 mg  200 mg Oral Daily Theressa Millard, MD   200 mg at 02/04/15 0946  . cholecalciferol (VITAMIN D) tablet 2,000 Units  2,000 Units  Oral Daily Theressa Millard, MD   2,000 Units at 02/04/15 0945  . diltiazem (CARDIZEM CD) 24 hr capsule 180 mg  180 mg Oral BID Theressa Millard, MD   180 mg at 02/03/15 2253  . DULoxetine (CYMBALTA) DR capsule 60 mg  60 mg Oral Daily Theressa Millard, MD   60 mg at 02/04/15 0945  . feeding supplement (ENSURE ENLIVE) (ENSURE ENLIVE) liquid 237 mL  237 mL Oral BID BM Harvette C Jenkins, MD      . furosemide (LASIX) tablet 20 mg  20 mg Oral Daily Theressa Millard, MD   20 mg at 02/04/15 0946  . HYDROmorphone (DILAUDID) injection 0.5-1 mg  0.5-1  mg Intravenous Q3H PRN Theressa Millard, MD      . insulin aspart (novoLOG) injection 0-5 Units  0-5 Units Subcutaneous QHS Theressa Millard, MD   0 Units at 02/03/15 2253  . insulin aspart (novoLOG) injection 0-9 Units  0-9 Units Subcutaneous TID WC Theressa Millard, MD   0 Units at 02/04/15 0747  . loratadine (CLARITIN) tablet 10 mg  10 mg Oral Daily Theressa Millard, MD   10 mg at 02/04/15 0945  . nitroGLYCERIN (NITROSTAT) SL tablet 0.4 mg  0.4 mg Sublingual Q5 min PRN Theressa Millard, MD      . ondansetron (ZOFRAN) tablet 4 mg  4 mg Oral Q6H PRN Theressa Millard, MD       Or  . ondansetron (ZOFRAN) injection 4 mg  4 mg Intravenous Q6H PRN Theressa Millard, MD      . oxyCODONE (Oxy IR/ROXICODONE) immediate release tablet 5 mg  5 mg Oral Q4H PRN Theressa Millard, MD      . pantoprazole (PROTONIX) EC tablet 40 mg  40 mg Oral Daily Theressa Millard, MD   40 mg at 02/04/15 0945  . pravastatin (PRAVACHOL) tablet 40 mg  40 mg Oral q1800 Harvette Evonnie Dawes, MD      . sodium chloride 0.9 % injection 3 mL  3 mL Intravenous Q12H Theressa Millard, MD   3 mL at 02/03/15 2254  . sodium chloride 0.9 % injection 3 mL  3 mL Intravenous Q12H Theressa Millard, MD   3 mL at 02/03/15 2254  . sodium chloride 0.9 % injection 3 mL  3 mL Intravenous PRN Theressa Millard, MD      . warfarin (COUMADIN) tablet 5 mg  5 mg Oral q1800 Theressa Millard, MD   5 mg at 02/03/15 2253  . Warfarin - Physician Dosing Inpatient   Does not apply q1800 Theressa Millard, MD       Do not use this list as official medication orders. Please verify with discharge summary.  Discharge Medications:   Medication List    ASK your doctor about these medications        ALPRAZolam 1 MG tablet  Commonly known as:  XANAX  Take 1 tablet by mouth 3 (three) times daily as needed for anxiety.     amiodarone 200 MG tablet  Commonly known as:  PACERONE  Take 1 tablet (200 mg total) by mouth daily.     b complex  vitamins capsule  Take 2 capsules by mouth daily.     buPROPion 300 MG 24 hr tablet  Commonly known as:  WELLBUTRIN XL  take 1 tablet by mouth once daily     CO-Q 10 OMEGA-3 FISH OIL PO  Take 100 mg elemental  calcium/kg/hr by mouth daily.     diazepam 5 MG tablet  Commonly known as:  VALIUM  Take 0.5 tablets (2.5 mg total) by mouth every 6 (six) hours as needed for muscle spasms or sedation.     Diclofenac Sodium 2 % Soln  Commonly known as:  PENNSAID  Place 1 application onto the skin 3 (three) times daily as needed.     diltiazem 180 MG 24 hr capsule  Commonly known as:  CARDIZEM CD  take 1 capsule by mouth twice a day     DULoxetine 60 MG capsule  Commonly known as:  CYMBALTA  take 1 capsule by mouth once daily     fluticasone 50 MCG/ACT nasal spray  Commonly known as:  FLONASE  instill 2 sprays into each nostril once daily     furosemide 20 MG tablet  Commonly known as:  LASIX  Take 1 tablet (20 mg total) by mouth daily.     loratadine 10 MG tablet  Commonly known as:  CLARITIN  Take 10 mg by mouth daily.     losartan 25 MG tablet  Commonly known as:  COZAAR  Take 1 tablet (25 mg total) by mouth daily.     Lysine 500 MG Tabs  Take 500 mg by mouth daily.     NATURE-THROID 113.75 MG Tabs  Generic drug:  Thyroid  Take 1 tablet by mouth daily.     NITROSTAT 0.4 MG SL tablet  Generic drug:  nitroGLYCERIN  place 1 tablet under the tongue every 5 minutes for UP TO 3 doses if needed     omeprazole 20 MG capsule  Commonly known as:  PRILOSEC  Take 20 mg by mouth daily.     oxyCODONE 5 MG immediate release tablet  Commonly known as:  ROXICODONE  Take 1 tablet (5 mg total) by mouth every 4 (four) hours as needed for severe pain (take 5-10 mg as needed for pain).     oxyCODONE-acetaminophen 5-325 MG tablet  Commonly known as:  PERCOCET  Take 1-2 tablets by mouth every 4 (four) hours as needed.     Pitavastatin Calcium 2 MG Tabs  Commonly known as:  LIVALO   take 1 tablet by mouth once daily     Pregnenolone Micronized Powd  Take 1 capsule by mouth daily.     Vitamin D 2000 UNITS Caps  Take 2,000 Int'l Units by mouth.     warfarin 5 MG tablet  Commonly known as:  COUMADIN  Take 5 mg daily except 7.5 mg on MWF or as directed        Relevant Imaging Results:  Relevant Lab Results:  Recent Labs    Additional Information SSN [id number]  Cranford Mon, [place]
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
## 2015-02-04 NOTE — Care Management Note (Signed)
Case Management Note  Patient Details  Name: [name] MRN: [id number] Date of Birth: [birth year]  Subjective/Objective:     Patient is for dc to snf today, CSW following.               Action/Plan:   Expected Discharge Date:                  Expected Discharge Plan:  Skilled Nursing Facility  In-House Referral:  Clinical Social Work  Discharge planning Services  CM Consult  Post Acute Care Choice:    Choice offered to:     DME Arranged:    DME Agency:     HH Arranged:    [place] Agency:     Status of Service:  Completed, signed off  Medicare Important Message Given:    Date Medicare IM Given:    Medicare IM give by:    Date Additional Medicare IM Given:    Additional Medicare Important Message give by:     If discussed at [place] of Stay Meetings, dates discussed:    Additional Comments:  Zenon Mayo, RN 02/04/2015, 11:36 AM

## 2015-02-04 NOTE — Care Management Obs Status (Signed)
[place] NOTIFICATION   Patient Details  Name: [name] MRN: [id number] Date of Birth: 1945/07/31   Medicare Observation Status Notification Given:  Yes    Zenon Mayo, RN 02/04/2015, 1:57 PM

## 2015-02-04 NOTE — Discharge Summary (Signed)
Physician Discharge Summary  [name] [id number] DOB: 1945-12-16 DOA: 02/03/2015  PCP: [name], MD  Admit date: 02/03/2015 Discharge date: 02/04/2015  Recommendations for Outpatient Follow-up:  1. Pt will need to follow up with PCP in 2 weeks post discharge 2. Please obtain INR on 02/06/15 and adjust coumadin dose accordingly for INR 2-3 3. Please check BMP and CBC on 02/06/15 Discharge Diagnoses:  Acute encephalopathy -Urinalysis negative for pyuria -Multifactorial including volume depletion (hyponatremia) as well as medications (opiods and benzodiazepines) -CT brain negative -improved  Status post right total shoulder arthroplasty -Secondary to mechanical fall that occurred approximately 2 weeks prior to the last admission (Dr. Onnie Graham) -judicious pain control -PT evaluation-->SNF CKD stage 3 -serum creatinine 1.32 on day of admission -baseline creatinine 0.9-1.2  -Improved with fluids -Hold losartan--do not plan to restart at time of d/c-->Pt remained normotensive--will need outpt follow to determine need to restart Deconditioning, generalized weakness -PT evaluation--recommended SNF -Plan for skilled nursing facility Hyponatremia -Secondary to volume depletion -Improved with IV fluids -Sodium 134 on the day of discharge Persistent atrial fibrillation -Continue diltiazem and warfarin -11/15/2013 echocardiogram-EF 50-55%, grade 1 DD  -Continue amiodarone (started July 2016) Coronary artery disease  -No anginal symptoms  -February 2011--CT angiogram coronaries -moderate calcium score of 161 (putting her at a high risk percentile for her age) and a possible moderate mid LAD stenosis (though some degradation of images by respiratory artifact) -Cardiology previously discussed heart catheterization, the patient preferred medical treatment  Chronic diastolic CHF  -Continue furosemide  -Appears euvolemic  -Dry weight appears to be approximately  224 pounds Hyperlipidemia  -Continue statin  Diabetes mellitus type 2 -02/06/2014 Hemoglobin A1c--6.8 -Patient states that she is not on any agents at this time -She will need to follow-up with her primary care provider for continued monitoring  Discharge Condition: stable  Disposition: SNF  Diet:heart healthy Wt Readings from Last 3 Encounters:  02/03/15 104 kg (229 lb 4.5 oz)  01/31/15 101.606 kg (224 lb)  01/25/15 101.787 kg (224 lb 6.4 oz)    History of present illness:  69 year old female with a history of non-Hodgkin's lymphoma, hyperlipidemia, atrial fibrillation on Coumadin, coronary artery disease, COPD, presented with increasing confusion and weakness after right total shoulder arthroplasty performed on 01/31/2015 after mechanical fall resulting in a displaced right proximal humerus fracture. The patient was discharged home from the hospital on 01/31/2015. Even prior to discharge from the hospital, review of the medical record showed that the patient had some confusion, and required significant assistance getting out of bed. At home, the patient had increasing confusion with hallucinations. She was discharged home with Valium and Percocet. Workup after admission revealed acute kidney injury with serum creatinine of 1.32. This did improve with some fluid. Her serum creatinine decreased back to baseline of 0.94 on the day of discharge. The patient's mental status improved back to baseline. Her hemoglobin remained largely stable. The patient was afebrile and hemodynamically stable.    Discharge Exam: Filed Vitals:   02/04/15 1031  BP: 116/59  Pulse: 75  Temp: 97.9 F (36.6 C)  Resp: 18   Filed Vitals:   02/03/15 2058 02/04/15 0656 02/04/15 0948 02/04/15 1031  BP: 122/59 114/68 102/51 116/59  Pulse: 87 64  75  Temp: 99.4 F (37.4 C) 97.9 F (36.6 C)  97.9 F (36.6 C)  TempSrc: Oral Oral  Oral  Resp: 18 20  18   Height: 5\' 6"  (1.676 m)     Weight: 104 kg (229 lb 4.5  oz)     SpO2: 95% 99%  98%   General: awake and alert, NAD, pleasant, cooperative Cardiovascular: IRRR, no rub, no gallop, no S3 Respiratory: CTAB, no wheeze, no rhonchi Abdomen:soft, nontender, nondistended, positive bowel sounds Extremities: 1+LE edema, No lymphangitis, no petechiae  Discharge Instructions      Discharge Instructions    Diet - low sodium heart healthy    Complete by:  As directed      Increase activity slowly    Complete by:  As directed             Medication List    STOP taking these medications        diazepam 5 MG tablet  Commonly known as:  VALIUM     losartan 25 MG tablet  Commonly known as:  COZAAR     oxyCODONE 5 MG immediate release tablet  Commonly known as:  ROXICODONE      TAKE these medications        ALPRAZolam 1 MG tablet  Commonly known as:  XANAX  Take 1 tablet (1 mg total) by mouth 3 (three) times daily as needed for anxiety.     amiodarone 200 MG tablet  Commonly known as:  PACERONE  Take 1 tablet (200 mg total) by mouth daily.     b complex vitamins capsule  Take 2 capsules by mouth daily.     buPROPion 300 MG 24 hr tablet  Commonly known as:  WELLBUTRIN XL  take 1 tablet by mouth once daily     CO-Q 10 OMEGA-3 FISH OIL PO  Take 100 mg elemental calcium/kg/hr by mouth daily.     Diclofenac Sodium 2 % Soln  Commonly known as:  PENNSAID  Place 1 application onto the skin 3 (three) times daily as needed.     diltiazem 180 MG 24 hr capsule  Commonly known as:  CARDIZEM CD  take 1 capsule by mouth twice a day     DULoxetine 60 MG capsule  Commonly known as:  CYMBALTA  take 1 capsule by mouth once daily     feeding supplement (ENSURE ENLIVE) Liqd  Take 237 mLs by mouth 2 (two) times daily between meals.     fluticasone 50 MCG/ACT nasal spray  Commonly known as:  FLONASE  instill 2 sprays into each nostril once daily     furosemide 20 MG tablet  Commonly known as:  LASIX  Take 1 tablet (20 mg total) by mouth  daily.     loratadine 10 MG tablet  Commonly known as:  CLARITIN  Take 10 mg by mouth daily.     Lysine 500 MG Tabs  Take 500 mg by mouth daily.     NATURE-THROID 113.75 MG Tabs  Generic drug:  Thyroid  Take 1 tablet by mouth daily.     NITROSTAT 0.4 MG SL tablet  Generic drug:  nitroGLYCERIN  place 1 tablet under the tongue every 5 minutes for UP TO 3 doses if needed     omeprazole 20 MG capsule  Commonly known as:  PRILOSEC  Take 20 mg by mouth daily.     oxyCODONE-acetaminophen 5-325 MG tablet  Commonly known as:  PERCOCET  Take 1-2 tablets by mouth every 4 (four) hours as needed.     Pitavastatin Calcium 2 MG Tabs  Commonly known as:  LIVALO  take 1 tablet by mouth once daily     Pregnenolone Micronized Powd  Take 1 capsule by mouth daily.  Vitamin D 2000 UNITS Caps  Take 2,000 Int'l Units by mouth.     warfarin 5 MG tablet  Commonly known as:  COUMADIN  Take 5 mg daily except 7.5 mg on MWF or as directed         The results of significant diagnostics from this hospitalization (including imaging, microbiology, ancillary and laboratory) are listed below for reference.    Significant Diagnostic Studies: Dg Chest 2 View  02/03/2015  CLINICAL DATA:  Altered mental status. EXAM: CHEST  2 VIEW COMPARISON:  PET-CT 03/28/2012 FINDINGS: Lungs are adequately inflated with mild elevation of the right hemidiaphragm. No focal consolidation or effusion. Mild stable cardiomegaly. Mild calcified plaque over the aortic arch. Right shoulder prosthesis is present. Loose body over the inferior left glenohumeral joint. Mild degenerate change of the spine. IMPRESSION: No acute cardiopulmonary disease. Electronically Signed   By: Marin Olp M.D.   On: 02/03/2015 17:11   Ct Head Wo Contrast  02/03/2015  CLINICAL DATA:  Altered mental status.  Headaches.  Fall last week. EXAM: CT HEAD WITHOUT CONTRAST TECHNIQUE: Contiguous axial images were obtained from the base of the skull  through the vertex without intravenous contrast. COMPARISON:  03/28/2012 FINDINGS: Hypodensity compatible with remote small infarct in the right inferior cerebellum, image 4 series 2, no change from 03/28/2012. Chronic appearing 0.9 by 0.5 cm lacunar infarct in the right thalamus, image 15 series 2. Periventricular white matter and corona radiata hypodensities favor chronic ischemic microvascular white matter disease. Right parietal vertex scalp lesion, image 30 series 2, nonspecific but probably chronic. No intracranial hemorrhage, mass lesion, or acute CVA. Chronic right maxillary sinusitis.  Mild chronic ethmoid sinusitis. There is atherosclerotic calcification of the cavernous carotid arteries bilaterally. IMPRESSION: 1. No acute intracranial findings. 2. 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 3. Chronic ethmoid and right maxillary sinusitis. 4. Right parietal vertex scalp lesion, probably a chronic/incidental, but correlate with any new injury in this vicinity in assessing whether this could possibly be a scalp hematoma. 5. Periventricular white matter and corona radiata hypodensities favor chronic ischemic microvascular white matter disease. Electronically Signed   By: Van Clines M.D.   On: 02/03/2015 19:54     Microbiology: Recent Results (from the past 240 hour(s))  Surgical pcr screen     Status: None   Collection Time: 01/25/15  2:15 PM  Result Value Ref Range Status   MRSA, PCR NEGATIVE NEGATIVE Final   Staphylococcus aureus NEGATIVE NEGATIVE Final    Comment:        The Xpert SA Assay (FDA approved for NASAL specimens in patients over 86 years of age), is one component of a comprehensive surveillance program.  Test performance has been validated by [facility] for patients greater than or equal to 59 year old. It is not intended to diagnose infection nor to guide or monitor treatment.      Labs: Basic Metabolic  Panel:  Recent Labs Lab 01/31/15 0905 02/03/15 1552 02/04/15 0544  NA 139 132* 134*  K 4.1 4.0 3.5  CL  --  99* 100*  CO2  --  24 25  GLUCOSE 145* 111* 91  BUN  --  17 12  CREATININE  --  1.32* 0.94  CALCIUM  --  8.4* 8.5*  MG  --  1.9  --    Liver Function Tests:  Recent Labs Lab 02/03/15 1552  AST 20  ALT 8*  ALKPHOS 80  BILITOT 0.7  PROT 5.8*  ALBUMIN 2.5*   No results for input(s): LIPASE, AMYLASE in the last 168 hours. No results for input(s): AMMONIA in the last 168 hours. CBC:  Recent Labs Lab 01/31/15 0905 02/01/15 0540 02/03/15 1552 02/04/15 0544  WBC  --   --  6.2 4.9  NEUTROABS  --   --  4.2  --   HGB 9.9* 9.3* 9.4* 8.9*  HCT 29.0* 28.6* 28.6* 27.4*  MCV  --   --  89.1 89.3  PLT  --   --  153 159   Cardiac Enzymes: No results for input(s): CKTOTAL, CKMB, CKMBINDEX, TROPONINI in the last 168 hours. BNP: Invalid input(s): POCBNP CBG:  Recent Labs Lab 02/03/15 2221 02/04/15 0738  GLUCAP 75 85    Time coordinating discharge:  Greater than 30 minutes  Signed:  Rai Sinagra, DO [facility] Pager: [phone] 02/04/2015, 11:57 AM

## 2015-02-04 NOTE — Clinical Social Work Placement (Signed)
   CLINICAL SOCIAL WORK PLACEMENT  NOTE  Date:  02/04/2015  Patient Details  Name: [name] MRN: [id number] Date of Birth: 05/15/1945  Clinical Social Work is seeking post-discharge placement for this patient at the [place] level of care (*CSW will initial, date and re-position this form in  chart as items are completed):  Yes   Patient/family provided with [place] Work Department's list of facilities offering this level of care within the geographic area requested by the patient (or if unable, by the patient's family).  Yes   Patient/family informed of their freedom to choose among providers that offer the needed level of care, that participate in Medicare, Medicaid or managed care program needed by the patient, have an available bed and are willing to accept the patient.  Yes   Patient/family informed of [facility]'s ownership interest in [facility] and [facility], as well as of the fact that they are under no obligation to receive care at these facilities.  PASRR submitted to EDS on       PASRR number received on       Existing PASRR number confirmed on 02/04/15     FL2 transmitted to all facilities in geographic area requested by pt/family on 02/04/15     FL2 transmitted to all facilities within larger geographic area on       Patient informed that his/her managed care company has contracts with or will negotiate with certain facilities, including the following:            Patient/family informed of bed offers received.  Patient chooses bed at       Physician recommends and patient chooses bed at      Patient to be transferred to   on  .  Patient to be transferred to facility by       Patient family notified on   of transfer.  Name of family member notified:        PHYSICIAN Please sign FL2     Additional Comment:    _______________________________________________ Cranford Mon, LCSW 02/04/2015,  10:36 AM

## 2015-02-04 NOTE — Progress Notes (Deleted)
Pt. And family Educated on plan of care to go to skilled nursing facility. Called report to at [facility].

## 2015-02-05 LAB — TYPE AND SCREEN
ABO/RH(D): B POS
ANTIBODY SCREEN: NEGATIVE
UNIT DIVISION: 0
Unit division: 0

## 2015-02-08 ENCOUNTER — Telehealth: Payer: Self-pay | Admitting: *Deleted

## 2015-02-08 ENCOUNTER — Telehealth: Payer: Self-pay | Admitting: Cardiology

## 2015-02-08 ENCOUNTER — Telehealth: Payer: Self-pay | Admitting: Medical Oncology

## 2015-02-08 DIAGNOSIS — H43811 Vitreous degeneration, right eye: Secondary | ICD-10-CM | POA: Diagnosis not present

## 2015-02-08 LAB — PROTIME-INR

## 2015-02-08 NOTE — Telephone Encounter (Signed)
Pt presented to the office needing to know what to do about PT/INR it was 9.9 today and was given Vit K at Rehab and she called Oncology and was told Dr.Mohamed was not in office today and told to contact PCP so pt came here per Arbie Cookey. Told Carol let me contact Oncology because they are following pt's coumadin not Dr. Elease Hashimoto and let pt know I will be with her.  Called Oncology and spoke to [place], explained pt showed up here needing orders and Dr. Shane Crutch does not follow pt for PT/INR levels Dr. Julien Nordmann does. Beth said yes, but Dr. Julien Nordmann is not here today. I said someone should be covering. Beth said let me call you back his nurse is at lunch. Told her okay.  Pt getting upset in waiting room. Brought pt back to a room and explained to her that I am waiting to hear back from Oncology to see what I need to do because Dr. Elease Hashimoto does not do your PT/INR and we are not open tomorrow. Pt verbalized understanding.  Called Oncology back and spoke to [facility] again told her pt is getting upset and I just need to know if I have Dr. Elease Hashimoto sign orders for repeat PT/INR to be done tomorrow at [place] can I have them call the on call doctor with results. Beth said yes, that is fine. Told her okay that is what I will do what number do I put on the order? Beth said [phone] and physician on call with answer. Told her okay, thank you.  Told pt go to [facility] tomorrow for repeat PT/INR, order given to pt. Told pt they will call doctor on call with results and then you will be given further instructions. Pt verbalized understanding.  Diane from Oncology called back after pt left and I told her pt given order by Dr. Elease Hashimoto to have PT/INR done tomorrow and they will call doctor on call at Oncology. Diane said that is fine.

## 2015-02-08 NOTE — Telephone Encounter (Signed)
° ° °  Pt INR was 9.9 and she wants rn to call her about orders

## 2015-02-08 NOTE — Telephone Encounter (Signed)
Dr Tereasa Coop ( at rehab center) ordered Vit k today for high PT/INR.  Pt was then discharged from facility ( ?did not want to stay) and told nurse she was going to Dr Anastasio Auerbach office. I requested Bill to fax lab results to cancer center. Coumadin dose this week was mon-wed 7.5 mg and Tuesday -Thursday 5 mg . Dr Tereasa Coop ordered to hold  coumadin today. I called Dr Anastasio Auerbach office and pt received order for PT /INR to be drawn tomorrow at [place] with results to be called on on call [facility].

## 2015-02-08 NOTE — Telephone Encounter (Signed)
Pt called from a SNF and stated that her INR is 9.9 and she needed an order to have a recheck tomorrow.  She said that the Oncology, Dr Mora Appl , clinic had been dosing her coumadin, but they had closed down the coumadin clinic there.  I put her on hold to discuss with another triage nurse.  When I came back she had hung up.  Determined that we hadn't ever dosed her coumadin.  Researched and determined that she was at [facility].  Called nurse Bill and confirmed the INR result reported by the patient.  Advised Bill that we haven't been dosing her coumadin and that we hadn't received a referral from Oncology to take over her coumadin, so we couldn't issue any orders concerning her coumadin.  Advised to contact the Oncology center and or her pcp to see who is currently responsible for her coumadin dosage.  Bill agreed.

## 2015-02-08 NOTE — Telephone Encounter (Signed)
Received call from Rush Landmark, nurse at [facility] , who is taking care of pt today.  Per Rush Landmark, pt had lab drawn today with INR 9.9 ;  Pt is currently taking Coumadin 5 mg alternating with 7.5 mg .  Pt was given Vit K today.   Bill requested an order for pt to have INR drawn  tomorrow 02/09/15.   Informed Bill that Dr. Julien Nordmann is not in office today.   Abelina Bachelor, desk nurse notified.  Spoke with Rush Landmark again, and informed him re: Per Diane, RN,  Fax INR results done today to pt's primary Dr. Carolann Littler, and also ask for INR order to be done on 02/09/15. Bill voiced understanding. [place] and [place]        [phone].

## 2015-02-08 NOTE — Telephone Encounter (Signed)
Spoke with Butch Penny @ Dr. Erick Blinks office. They will write prescription for PT/INR to be done tomorrow at a local [facility] office and then [facility] to call [facility] oncologist on all with resyults.  Dr. Benay Spice is on call Saturday, 02/09/15     Butch Penny voiced understanding and will inform patient.

## 2015-02-09 ENCOUNTER — Other Ambulatory Visit (HOSPITAL_COMMUNITY)
Admit: 2015-02-09 | Discharge: 2015-02-09 | Disposition: A | Discharge status: Home / Self Care | Payer: Medicare Other | Attending: Family Medicine | Admitting: Family Medicine

## 2015-02-09 DIAGNOSIS — Z029 Encounter for administrative examinations, unspecified: Secondary | ICD-10-CM | POA: Diagnosis present

## 2015-02-09 LAB — PROTIME-INR
INR: 2.76 — AB (ref 0.00–1.49)
PROTHROMBIN TIME: 28.7 s — AB (ref 11.6–15.2)

## 2015-02-10 DIAGNOSIS — M1990 Unspecified osteoarthritis, unspecified site: Secondary | ICD-10-CM | POA: Diagnosis not present

## 2015-02-10 DIAGNOSIS — M5136 Other intervertebral disc degeneration, lumbar region: Secondary | ICD-10-CM | POA: Diagnosis not present

## 2015-02-10 DIAGNOSIS — G510 Bell's palsy: Secondary | ICD-10-CM | POA: Diagnosis not present

## 2015-02-10 DIAGNOSIS — J449 Chronic obstructive pulmonary disease, unspecified: Secondary | ICD-10-CM | POA: Diagnosis not present

## 2015-02-10 DIAGNOSIS — E785 Hyperlipidemia, unspecified: Secondary | ICD-10-CM | POA: Diagnosis not present

## 2015-02-10 DIAGNOSIS — S42201D Unspecified fracture of upper end of right humerus, subsequent encounter for fracture with routine healing: Secondary | ICD-10-CM | POA: Diagnosis not present

## 2015-02-10 DIAGNOSIS — I2510 Atherosclerotic heart disease of native coronary artery without angina pectoris: Secondary | ICD-10-CM | POA: Diagnosis not present

## 2015-02-10 DIAGNOSIS — I481 Persistent atrial fibrillation: Secondary | ICD-10-CM | POA: Diagnosis not present

## 2015-02-10 DIAGNOSIS — I5032 Chronic diastolic (congestive) heart failure: Secondary | ICD-10-CM | POA: Diagnosis not present

## 2015-02-10 DIAGNOSIS — E1122 Type 2 diabetes mellitus with diabetic chronic kidney disease: Secondary | ICD-10-CM | POA: Diagnosis not present

## 2015-02-10 DIAGNOSIS — N183 Chronic kidney disease, stage 3 (moderate): Secondary | ICD-10-CM | POA: Diagnosis not present

## 2015-02-10 DIAGNOSIS — F329 Major depressive disorder, single episode, unspecified: Secondary | ICD-10-CM | POA: Diagnosis not present

## 2015-02-10 DIAGNOSIS — F419 Anxiety disorder, unspecified: Secondary | ICD-10-CM | POA: Diagnosis not present

## 2015-02-11 ENCOUNTER — Telehealth: Payer: Self-pay | Admitting: Cardiology

## 2015-02-11 ENCOUNTER — Telehealth: Payer: Self-pay | Admitting: *Deleted

## 2015-02-11 NOTE — Telephone Encounter (Signed)
New message     While in [place], pt fell and had and accident.  While in the hosp there, they said pt had had 3 small strokes in the past.  She was not aware of that.  She want to talk to the nurse regarding symptoms of a stroke

## 2015-02-11 NOTE — Telephone Encounter (Signed)
Pt fell while  she was in [place] 2 months ago. Pt landed on her face, and had some lacerations. Pt had an MRI while in the hospital. Pt was told that she has had 3 small strokes. Pt wants to know the signs and symptoms. S&S given . Pt denies having any stroke symptoms. Pt also mentioned that she had an INR of 9.9 this past Friday November 18 th prior D/C from [facility] center. According to pt  INR was rechecked on Saturday at [facility] ER. She would like to know the results. PT/INR results given to pt 28.7/2.76 results reviewed per Dr. Elease Hashimoto: INR Stable. " If she is going to get it monitored here , we need to get her set up to F/U in the coumadin clinic in one month".  Pt is aware, she would like to know if the INR can be checked in this office Coumadin clinic instead.

## 2015-02-11 NOTE — Telephone Encounter (Signed)
She can have INR followed wherever is easiest for her.

## 2015-02-11 NOTE — Telephone Encounter (Signed)
Dr. Clarisa Kindred is going to manage pt's coumadin per pt.  Pt advised her PT/INR was 2.7.Pt advised she is going to start taking 5mg  on Tuesday.  Instructed pt to call Dr. Oleh Genin office and inquire when she can be seen. Discuss with staff the importance of why she is calling. Pt agreed verbalized she would call today. No further concerns.

## 2015-02-11 NOTE — Telephone Encounter (Signed)
-----   Message from Ardeen Garland, RN sent at 02/11/2015  2:33 PM EST ----- Can you f/u with pt   ----- Message -----    From: Curt Bears, MD    Sent: 02/09/2015  11:03 AM      To: Larey Dresser, MD, [name], MD, #  I saw this patient one time. Unfortunately the Coumadin clinic at the [place] is closed. if Coumadin is not need for cardiac condition, she can discontinue it as she completed more than one year for treatment of DVT, but if needed this will need to be managed by her cardiologist, Dr. Aundra Dubin or primary care physician, Dr. Elease Hashimoto. Thank you Julien Nordmann  ----- Message -----    From: Ardeen Garland, RN    Sent: 02/08/2015   3:50 PM      To: Curt Bears, MD  Dr Tereasa Coop ( at rehab center) ordered Vit k today for high PT/INR. Pt was then discharged from facility ( ?did not want to stay) and told nurse she was going to Dr Anastasio Auerbach office. I requested Bill to fax lab results to cancer center. Coumadin dose this week was mon-wed 7.5 mg and Tuesday -Thursday 5 mg . Dr Tereasa Coop ordered to hold coumadin today. I called Dr Anastasio Auerbach office and pt received order for PT /INR to be drawn tomorrow at [place] with results to be called on on call [facility].  When do you want to see pt back . She was coumadin clinic pt.

## 2015-02-12 DIAGNOSIS — N289 Disorder of kidney and ureter, unspecified: Secondary | ICD-10-CM | POA: Diagnosis not present

## 2015-02-12 DIAGNOSIS — N261 Atrophy of kidney (terminal): Secondary | ICD-10-CM | POA: Diagnosis not present

## 2015-02-12 NOTE — Telephone Encounter (Signed)
UPDATE--Per Coumadin Clinic pt needs new pt appt in our coumadin clinic on 02/18/15.

## 2015-02-12 NOTE — Telephone Encounter (Signed)
-----   Message from Earvin Hansen sent at 02/11/2015 12:12 PM EST ----- Will forward to Coumadin Clinic so this can be arranged   ----- Message -----    From: Larey Dresser, MD    Sent: 02/09/2015  11:47 AM      To: Katrine Coho, RN, [place] Triage  Webb Silversmith, Please make sure Mrs Rynearson is seen in our coumadin clinic.   ----- Message -----    From: Curt Bears, MD    Sent: 02/09/2015  11:03 AM      To: Larey Dresser, MD, [name], MD, #  I saw this patient one time. Unfortunately the Coumadin clinic at the [place] is closed. if Coumadin is not need for cardiac condition, she can discontinue it as she completed more than one year for treatment of DVT, but if needed this will need to be managed by her cardiologist, Dr. Aundra Dubin or primary care physician, Dr. Elease Hashimoto. Thank you Julien Nordmann  ----- Message -----    From: Ardeen Garland, RN    Sent: 02/08/2015   3:50 PM      To: Curt Bears, MD  Dr Tereasa Coop ( at rehab center) ordered Vit k today for high PT/INR. Pt was then discharged from facility ( ?did not want to stay) and told nurse she was going to Dr Anastasio Auerbach office. I requested Bill to fax lab results to cancer center. Coumadin dose this week was mon-wed 7.5 mg and Tuesday -Thursday 5 mg . Dr Tereasa Coop ordered to hold coumadin today. I called Dr Anastasio Auerbach office and pt received order for PT /INR to be drawn tomorrow at [place] with results to be called on on call [facility].  When do you want to see pt back . She was coumadin clinic pt.

## 2015-02-12 NOTE — Telephone Encounter (Signed)
Left message on pt;s phone to call to set up time for an appt to be seen in coumadin clinic on Monday Nov 28th

## 2015-02-12 NOTE — Telephone Encounter (Signed)
Reviewed with coumadin clinic and pt should be scheduled for new pt appt on 03/11/15.  I placed call to pt and left message to call back

## 2015-02-13 NOTE — Telephone Encounter (Signed)
Spoke w/pt and scheduled her for new pt coumadin visit Mon 11/28 at 9:30.  She verbalizes understanding and will be at appointment.

## 2015-02-18 ENCOUNTER — Ambulatory Visit (INDEPENDENT_AMBULATORY_CARE_PROVIDER_SITE_OTHER): Payer: Medicare Other | Admitting: Pharmacist

## 2015-02-18 DIAGNOSIS — S42291D Other displaced fracture of upper end of right humerus, subsequent encounter for fracture with routine healing: Secondary | ICD-10-CM | POA: Diagnosis not present

## 2015-02-18 DIAGNOSIS — Z5181 Encounter for therapeutic drug level monitoring: Secondary | ICD-10-CM | POA: Diagnosis not present

## 2015-02-18 DIAGNOSIS — I480 Paroxysmal atrial fibrillation: Secondary | ICD-10-CM

## 2015-02-18 DIAGNOSIS — Z7901 Long term (current) use of anticoagulants: Secondary | ICD-10-CM

## 2015-02-18 DIAGNOSIS — I824Z9 Acute embolism and thrombosis of unspecified deep veins of unspecified distal lower extremity: Secondary | ICD-10-CM | POA: Diagnosis not present

## 2015-02-18 DIAGNOSIS — Z4789 Encounter for other orthopedic aftercare: Secondary | ICD-10-CM | POA: Diagnosis not present

## 2015-02-18 LAB — POCT INR: INR: 1.3

## 2015-02-20 DIAGNOSIS — E785 Hyperlipidemia, unspecified: Secondary | ICD-10-CM | POA: Diagnosis not present

## 2015-02-20 DIAGNOSIS — J449 Chronic obstructive pulmonary disease, unspecified: Secondary | ICD-10-CM | POA: Diagnosis not present

## 2015-02-20 DIAGNOSIS — M1990 Unspecified osteoarthritis, unspecified site: Secondary | ICD-10-CM | POA: Diagnosis not present

## 2015-02-20 DIAGNOSIS — G510 Bell's palsy: Secondary | ICD-10-CM | POA: Diagnosis not present

## 2015-02-20 DIAGNOSIS — I2510 Atherosclerotic heart disease of native coronary artery without angina pectoris: Secondary | ICD-10-CM | POA: Diagnosis not present

## 2015-02-20 DIAGNOSIS — I481 Persistent atrial fibrillation: Secondary | ICD-10-CM | POA: Diagnosis not present

## 2015-02-20 DIAGNOSIS — E1122 Type 2 diabetes mellitus with diabetic chronic kidney disease: Secondary | ICD-10-CM | POA: Diagnosis not present

## 2015-02-20 DIAGNOSIS — N183 Chronic kidney disease, stage 3 (moderate): Secondary | ICD-10-CM | POA: Diagnosis not present

## 2015-02-20 DIAGNOSIS — I5032 Chronic diastolic (congestive) heart failure: Secondary | ICD-10-CM | POA: Diagnosis not present

## 2015-02-20 DIAGNOSIS — S42201D Unspecified fracture of upper end of right humerus, subsequent encounter for fracture with routine healing: Secondary | ICD-10-CM | POA: Diagnosis not present

## 2015-02-20 DIAGNOSIS — F329 Major depressive disorder, single episode, unspecified: Secondary | ICD-10-CM | POA: Diagnosis not present

## 2015-02-20 DIAGNOSIS — F419 Anxiety disorder, unspecified: Secondary | ICD-10-CM | POA: Diagnosis not present

## 2015-02-20 DIAGNOSIS — M5136 Other intervertebral disc degeneration, lumbar region: Secondary | ICD-10-CM | POA: Diagnosis not present

## 2015-02-21 DIAGNOSIS — I481 Persistent atrial fibrillation: Secondary | ICD-10-CM | POA: Diagnosis not present

## 2015-02-21 DIAGNOSIS — N183 Chronic kidney disease, stage 3 (moderate): Secondary | ICD-10-CM | POA: Diagnosis not present

## 2015-02-21 DIAGNOSIS — G510 Bell's palsy: Secondary | ICD-10-CM | POA: Diagnosis not present

## 2015-02-21 DIAGNOSIS — I2510 Atherosclerotic heart disease of native coronary artery without angina pectoris: Secondary | ICD-10-CM | POA: Diagnosis not present

## 2015-02-21 DIAGNOSIS — M5136 Other intervertebral disc degeneration, lumbar region: Secondary | ICD-10-CM | POA: Diagnosis not present

## 2015-02-21 DIAGNOSIS — J449 Chronic obstructive pulmonary disease, unspecified: Secondary | ICD-10-CM | POA: Diagnosis not present

## 2015-02-21 DIAGNOSIS — F329 Major depressive disorder, single episode, unspecified: Secondary | ICD-10-CM | POA: Diagnosis not present

## 2015-02-21 DIAGNOSIS — M1990 Unspecified osteoarthritis, unspecified site: Secondary | ICD-10-CM | POA: Diagnosis not present

## 2015-02-21 DIAGNOSIS — F419 Anxiety disorder, unspecified: Secondary | ICD-10-CM | POA: Diagnosis not present

## 2015-02-21 DIAGNOSIS — E1122 Type 2 diabetes mellitus with diabetic chronic kidney disease: Secondary | ICD-10-CM | POA: Diagnosis not present

## 2015-02-21 DIAGNOSIS — I5032 Chronic diastolic (congestive) heart failure: Secondary | ICD-10-CM | POA: Diagnosis not present

## 2015-02-21 DIAGNOSIS — S42201D Unspecified fracture of upper end of right humerus, subsequent encounter for fracture with routine healing: Secondary | ICD-10-CM | POA: Diagnosis not present

## 2015-02-21 DIAGNOSIS — E785 Hyperlipidemia, unspecified: Secondary | ICD-10-CM | POA: Diagnosis not present

## 2015-02-22 ENCOUNTER — Telehealth: Payer: Self-pay | Admitting: Cardiology

## 2015-02-22 DIAGNOSIS — S42201D Unspecified fracture of upper end of right humerus, subsequent encounter for fracture with routine healing: Secondary | ICD-10-CM | POA: Diagnosis not present

## 2015-02-22 DIAGNOSIS — I5032 Chronic diastolic (congestive) heart failure: Secondary | ICD-10-CM | POA: Diagnosis not present

## 2015-02-22 DIAGNOSIS — I481 Persistent atrial fibrillation: Secondary | ICD-10-CM | POA: Diagnosis not present

## 2015-02-22 DIAGNOSIS — M5136 Other intervertebral disc degeneration, lumbar region: Secondary | ICD-10-CM | POA: Diagnosis not present

## 2015-02-22 DIAGNOSIS — J449 Chronic obstructive pulmonary disease, unspecified: Secondary | ICD-10-CM | POA: Diagnosis not present

## 2015-02-22 DIAGNOSIS — S52531D Colles' fracture of right radius, subsequent encounter for closed fracture with routine healing: Secondary | ICD-10-CM | POA: Diagnosis not present

## 2015-02-22 DIAGNOSIS — E1122 Type 2 diabetes mellitus with diabetic chronic kidney disease: Secondary | ICD-10-CM | POA: Diagnosis not present

## 2015-02-22 DIAGNOSIS — G510 Bell's palsy: Secondary | ICD-10-CM | POA: Diagnosis not present

## 2015-02-22 DIAGNOSIS — F419 Anxiety disorder, unspecified: Secondary | ICD-10-CM | POA: Diagnosis not present

## 2015-02-22 DIAGNOSIS — I2510 Atherosclerotic heart disease of native coronary artery without angina pectoris: Secondary | ICD-10-CM | POA: Diagnosis not present

## 2015-02-22 DIAGNOSIS — F329 Major depressive disorder, single episode, unspecified: Secondary | ICD-10-CM | POA: Diagnosis not present

## 2015-02-22 DIAGNOSIS — N183 Chronic kidney disease, stage 3 (moderate): Secondary | ICD-10-CM | POA: Diagnosis not present

## 2015-02-22 DIAGNOSIS — E785 Hyperlipidemia, unspecified: Secondary | ICD-10-CM | POA: Diagnosis not present

## 2015-02-22 DIAGNOSIS — M1990 Unspecified osteoarthritis, unspecified site: Secondary | ICD-10-CM | POA: Diagnosis not present

## 2015-02-22 NOTE — Telephone Encounter (Signed)
Spoke with patient and advised it did not look as thought anyone called her.  Patient did have a concern about taking Amiodarone and Warfarin together since her pharmacist told her she should not be taking both together Did reassure patient that Dr Aundra Dubin and the Coumadin clinic monitor closely while taking these together Patient did want for me to forward to Dr Aundra Dubin to make sure no changes necessary, will forward for review

## 2015-02-22 NOTE — Telephone Encounter (Signed)
New message      Pt is returning a call to the nurse.  She does not know who called.  However, she is on amiodarone and warfarin and think it may have been the nurse.

## 2015-02-22 NOTE — Telephone Encounter (Signed)
Advised patient, verbalized understanding  

## 2015-02-22 NOTE — Telephone Encounter (Signed)
Many people take both amiodarone and warfarin together, just needs to be carefully monitored.

## 2015-02-27 ENCOUNTER — Telehealth: Payer: Self-pay | Admitting: Cardiology

## 2015-02-27 DIAGNOSIS — E1122 Type 2 diabetes mellitus with diabetic chronic kidney disease: Secondary | ICD-10-CM | POA: Diagnosis not present

## 2015-02-27 DIAGNOSIS — M1990 Unspecified osteoarthritis, unspecified site: Secondary | ICD-10-CM | POA: Diagnosis not present

## 2015-02-27 DIAGNOSIS — N183 Chronic kidney disease, stage 3 (moderate): Secondary | ICD-10-CM | POA: Diagnosis not present

## 2015-02-27 DIAGNOSIS — F419 Anxiety disorder, unspecified: Secondary | ICD-10-CM | POA: Diagnosis not present

## 2015-02-27 DIAGNOSIS — E785 Hyperlipidemia, unspecified: Secondary | ICD-10-CM | POA: Diagnosis not present

## 2015-02-27 DIAGNOSIS — I5032 Chronic diastolic (congestive) heart failure: Secondary | ICD-10-CM | POA: Diagnosis not present

## 2015-02-27 DIAGNOSIS — G510 Bell's palsy: Secondary | ICD-10-CM | POA: Diagnosis not present

## 2015-02-27 DIAGNOSIS — M5136 Other intervertebral disc degeneration, lumbar region: Secondary | ICD-10-CM | POA: Diagnosis not present

## 2015-02-27 DIAGNOSIS — J449 Chronic obstructive pulmonary disease, unspecified: Secondary | ICD-10-CM | POA: Diagnosis not present

## 2015-02-27 DIAGNOSIS — I2510 Atherosclerotic heart disease of native coronary artery without angina pectoris: Secondary | ICD-10-CM | POA: Diagnosis not present

## 2015-02-27 DIAGNOSIS — I481 Persistent atrial fibrillation: Secondary | ICD-10-CM | POA: Diagnosis not present

## 2015-02-27 DIAGNOSIS — S42201D Unspecified fracture of upper end of right humerus, subsequent encounter for fracture with routine healing: Secondary | ICD-10-CM | POA: Diagnosis not present

## 2015-02-27 DIAGNOSIS — F329 Major depressive disorder, single episode, unspecified: Secondary | ICD-10-CM | POA: Diagnosis not present

## 2015-02-27 NOTE — Telephone Encounter (Signed)
New message      Calling to verify diagnosis of heart failure

## 2015-02-28 ENCOUNTER — Ambulatory Visit (INDEPENDENT_AMBULATORY_CARE_PROVIDER_SITE_OTHER): Payer: Medicare Other | Admitting: *Deleted

## 2015-02-28 DIAGNOSIS — Z5181 Encounter for therapeutic drug level monitoring: Secondary | ICD-10-CM

## 2015-02-28 DIAGNOSIS — Z7901 Long term (current) use of anticoagulants: Secondary | ICD-10-CM | POA: Diagnosis not present

## 2015-02-28 DIAGNOSIS — I824Z9 Acute embolism and thrombosis of unspecified deep veins of unspecified distal lower extremity: Secondary | ICD-10-CM

## 2015-02-28 DIAGNOSIS — I480 Paroxysmal atrial fibrillation: Secondary | ICD-10-CM | POA: Diagnosis not present

## 2015-02-28 LAB — POCT INR: INR: 4

## 2015-02-28 NOTE — Telephone Encounter (Signed)
LMTCB for Harmon Pier

## 2015-02-28 NOTE — Telephone Encounter (Signed)
Spoke with Harmon Pier she is going to fax EF % form over.

## 2015-02-28 NOTE — Telephone Encounter (Signed)
Per Teri--a request for medical records should be faxed to Medical Records to handle request for medical information, should not give medical information over the telephone.  I will forward to Medical Records

## 2015-03-01 DIAGNOSIS — E1122 Type 2 diabetes mellitus with diabetic chronic kidney disease: Secondary | ICD-10-CM | POA: Diagnosis not present

## 2015-03-01 DIAGNOSIS — F419 Anxiety disorder, unspecified: Secondary | ICD-10-CM | POA: Diagnosis not present

## 2015-03-01 DIAGNOSIS — I481 Persistent atrial fibrillation: Secondary | ICD-10-CM | POA: Diagnosis not present

## 2015-03-01 DIAGNOSIS — J449 Chronic obstructive pulmonary disease, unspecified: Secondary | ICD-10-CM | POA: Diagnosis not present

## 2015-03-01 DIAGNOSIS — E785 Hyperlipidemia, unspecified: Secondary | ICD-10-CM | POA: Diagnosis not present

## 2015-03-01 DIAGNOSIS — S42201D Unspecified fracture of upper end of right humerus, subsequent encounter for fracture with routine healing: Secondary | ICD-10-CM | POA: Diagnosis not present

## 2015-03-01 DIAGNOSIS — G510 Bell's palsy: Secondary | ICD-10-CM | POA: Diagnosis not present

## 2015-03-01 DIAGNOSIS — M1990 Unspecified osteoarthritis, unspecified site: Secondary | ICD-10-CM | POA: Diagnosis not present

## 2015-03-01 DIAGNOSIS — I5032 Chronic diastolic (congestive) heart failure: Secondary | ICD-10-CM | POA: Diagnosis not present

## 2015-03-01 DIAGNOSIS — N183 Chronic kidney disease, stage 3 (moderate): Secondary | ICD-10-CM | POA: Diagnosis not present

## 2015-03-01 DIAGNOSIS — F329 Major depressive disorder, single episode, unspecified: Secondary | ICD-10-CM | POA: Diagnosis not present

## 2015-03-01 DIAGNOSIS — M5136 Other intervertebral disc degeneration, lumbar region: Secondary | ICD-10-CM | POA: Diagnosis not present

## 2015-03-01 DIAGNOSIS — I2510 Atherosclerotic heart disease of native coronary artery without angina pectoris: Secondary | ICD-10-CM | POA: Diagnosis not present

## 2015-03-04 DIAGNOSIS — H3561 Retinal hemorrhage, right eye: Secondary | ICD-10-CM | POA: Diagnosis not present

## 2015-03-06 ENCOUNTER — Other Ambulatory Visit: Payer: Self-pay

## 2015-03-06 MED ORDER — PITAVASTATIN CALCIUM 2 MG PO TABS: ORAL_TABLET | ORAL | Status: DC

## 2015-03-07 ENCOUNTER — Ambulatory Visit (INDEPENDENT_AMBULATORY_CARE_PROVIDER_SITE_OTHER): Payer: Medicare Other | Admitting: *Deleted

## 2015-03-07 DIAGNOSIS — I480 Paroxysmal atrial fibrillation: Secondary | ICD-10-CM

## 2015-03-07 DIAGNOSIS — Z7901 Long term (current) use of anticoagulants: Secondary | ICD-10-CM | POA: Diagnosis not present

## 2015-03-07 DIAGNOSIS — Z5181 Encounter for therapeutic drug level monitoring: Secondary | ICD-10-CM | POA: Diagnosis not present

## 2015-03-07 DIAGNOSIS — I824Z9 Acute embolism and thrombosis of unspecified deep veins of unspecified distal lower extremity: Secondary | ICD-10-CM

## 2015-03-07 LAB — POCT INR: INR: 2.9

## 2015-03-08 DIAGNOSIS — F329 Major depressive disorder, single episode, unspecified: Secondary | ICD-10-CM | POA: Diagnosis not present

## 2015-03-08 DIAGNOSIS — S42201D Unspecified fracture of upper end of right humerus, subsequent encounter for fracture with routine healing: Secondary | ICD-10-CM | POA: Diagnosis not present

## 2015-03-08 DIAGNOSIS — G510 Bell's palsy: Secondary | ICD-10-CM | POA: Diagnosis not present

## 2015-03-08 DIAGNOSIS — I481 Persistent atrial fibrillation: Secondary | ICD-10-CM | POA: Diagnosis not present

## 2015-03-08 DIAGNOSIS — I5032 Chronic diastolic (congestive) heart failure: Secondary | ICD-10-CM | POA: Diagnosis not present

## 2015-03-08 DIAGNOSIS — E785 Hyperlipidemia, unspecified: Secondary | ICD-10-CM | POA: Diagnosis not present

## 2015-03-08 DIAGNOSIS — I2510 Atherosclerotic heart disease of native coronary artery without angina pectoris: Secondary | ICD-10-CM | POA: Diagnosis not present

## 2015-03-08 DIAGNOSIS — M1990 Unspecified osteoarthritis, unspecified site: Secondary | ICD-10-CM | POA: Diagnosis not present

## 2015-03-08 DIAGNOSIS — F419 Anxiety disorder, unspecified: Secondary | ICD-10-CM | POA: Diagnosis not present

## 2015-03-08 DIAGNOSIS — M5136 Other intervertebral disc degeneration, lumbar region: Secondary | ICD-10-CM | POA: Diagnosis not present

## 2015-03-08 DIAGNOSIS — J449 Chronic obstructive pulmonary disease, unspecified: Secondary | ICD-10-CM | POA: Diagnosis not present

## 2015-03-08 DIAGNOSIS — E1122 Type 2 diabetes mellitus with diabetic chronic kidney disease: Secondary | ICD-10-CM | POA: Diagnosis not present

## 2015-03-08 DIAGNOSIS — N183 Chronic kidney disease, stage 3 (moderate): Secondary | ICD-10-CM | POA: Diagnosis not present

## 2015-03-11 DIAGNOSIS — S42201D Unspecified fracture of upper end of right humerus, subsequent encounter for fracture with routine healing: Secondary | ICD-10-CM | POA: Diagnosis not present

## 2015-03-11 DIAGNOSIS — M5136 Other intervertebral disc degeneration, lumbar region: Secondary | ICD-10-CM | POA: Diagnosis not present

## 2015-03-11 DIAGNOSIS — E1122 Type 2 diabetes mellitus with diabetic chronic kidney disease: Secondary | ICD-10-CM | POA: Diagnosis not present

## 2015-03-11 DIAGNOSIS — M1990 Unspecified osteoarthritis, unspecified site: Secondary | ICD-10-CM | POA: Diagnosis not present

## 2015-03-11 DIAGNOSIS — J449 Chronic obstructive pulmonary disease, unspecified: Secondary | ICD-10-CM | POA: Diagnosis not present

## 2015-03-11 DIAGNOSIS — E785 Hyperlipidemia, unspecified: Secondary | ICD-10-CM | POA: Diagnosis not present

## 2015-03-11 DIAGNOSIS — F419 Anxiety disorder, unspecified: Secondary | ICD-10-CM | POA: Diagnosis not present

## 2015-03-11 DIAGNOSIS — F329 Major depressive disorder, single episode, unspecified: Secondary | ICD-10-CM | POA: Diagnosis not present

## 2015-03-11 DIAGNOSIS — I2510 Atherosclerotic heart disease of native coronary artery without angina pectoris: Secondary | ICD-10-CM | POA: Diagnosis not present

## 2015-03-11 DIAGNOSIS — I5032 Chronic diastolic (congestive) heart failure: Secondary | ICD-10-CM | POA: Diagnosis not present

## 2015-03-11 DIAGNOSIS — I481 Persistent atrial fibrillation: Secondary | ICD-10-CM | POA: Diagnosis not present

## 2015-03-11 DIAGNOSIS — G510 Bell's palsy: Secondary | ICD-10-CM | POA: Diagnosis not present

## 2015-03-11 DIAGNOSIS — N183 Chronic kidney disease, stage 3 (moderate): Secondary | ICD-10-CM | POA: Diagnosis not present

## 2015-03-12 DIAGNOSIS — G510 Bell's palsy: Secondary | ICD-10-CM | POA: Diagnosis not present

## 2015-03-12 DIAGNOSIS — E785 Hyperlipidemia, unspecified: Secondary | ICD-10-CM | POA: Diagnosis not present

## 2015-03-12 DIAGNOSIS — N183 Chronic kidney disease, stage 3 (moderate): Secondary | ICD-10-CM | POA: Diagnosis not present

## 2015-03-12 DIAGNOSIS — M5136 Other intervertebral disc degeneration, lumbar region: Secondary | ICD-10-CM | POA: Diagnosis not present

## 2015-03-12 DIAGNOSIS — F419 Anxiety disorder, unspecified: Secondary | ICD-10-CM | POA: Diagnosis not present

## 2015-03-12 DIAGNOSIS — F329 Major depressive disorder, single episode, unspecified: Secondary | ICD-10-CM | POA: Diagnosis not present

## 2015-03-12 DIAGNOSIS — I2510 Atherosclerotic heart disease of native coronary artery without angina pectoris: Secondary | ICD-10-CM | POA: Diagnosis not present

## 2015-03-12 DIAGNOSIS — S42201D Unspecified fracture of upper end of right humerus, subsequent encounter for fracture with routine healing: Secondary | ICD-10-CM | POA: Diagnosis not present

## 2015-03-12 DIAGNOSIS — J449 Chronic obstructive pulmonary disease, unspecified: Secondary | ICD-10-CM | POA: Diagnosis not present

## 2015-03-12 DIAGNOSIS — I5032 Chronic diastolic (congestive) heart failure: Secondary | ICD-10-CM | POA: Diagnosis not present

## 2015-03-12 DIAGNOSIS — I481 Persistent atrial fibrillation: Secondary | ICD-10-CM | POA: Diagnosis not present

## 2015-03-12 DIAGNOSIS — E1122 Type 2 diabetes mellitus with diabetic chronic kidney disease: Secondary | ICD-10-CM | POA: Diagnosis not present

## 2015-03-12 DIAGNOSIS — M1990 Unspecified osteoarthritis, unspecified site: Secondary | ICD-10-CM | POA: Diagnosis not present

## 2015-03-15 DIAGNOSIS — E785 Hyperlipidemia, unspecified: Secondary | ICD-10-CM | POA: Diagnosis not present

## 2015-03-15 DIAGNOSIS — M5136 Other intervertebral disc degeneration, lumbar region: Secondary | ICD-10-CM | POA: Diagnosis not present

## 2015-03-15 DIAGNOSIS — I2510 Atherosclerotic heart disease of native coronary artery without angina pectoris: Secondary | ICD-10-CM | POA: Diagnosis not present

## 2015-03-15 DIAGNOSIS — M1990 Unspecified osteoarthritis, unspecified site: Secondary | ICD-10-CM | POA: Diagnosis not present

## 2015-03-15 DIAGNOSIS — J449 Chronic obstructive pulmonary disease, unspecified: Secondary | ICD-10-CM | POA: Diagnosis not present

## 2015-03-15 DIAGNOSIS — F419 Anxiety disorder, unspecified: Secondary | ICD-10-CM | POA: Diagnosis not present

## 2015-03-15 DIAGNOSIS — S42201D Unspecified fracture of upper end of right humerus, subsequent encounter for fracture with routine healing: Secondary | ICD-10-CM | POA: Diagnosis not present

## 2015-03-15 DIAGNOSIS — I481 Persistent atrial fibrillation: Secondary | ICD-10-CM | POA: Diagnosis not present

## 2015-03-15 DIAGNOSIS — N183 Chronic kidney disease, stage 3 (moderate): Secondary | ICD-10-CM | POA: Diagnosis not present

## 2015-03-15 DIAGNOSIS — E1122 Type 2 diabetes mellitus with diabetic chronic kidney disease: Secondary | ICD-10-CM | POA: Diagnosis not present

## 2015-03-15 DIAGNOSIS — F329 Major depressive disorder, single episode, unspecified: Secondary | ICD-10-CM | POA: Diagnosis not present

## 2015-03-15 DIAGNOSIS — G510 Bell's palsy: Secondary | ICD-10-CM | POA: Diagnosis not present

## 2015-03-15 DIAGNOSIS — I5032 Chronic diastolic (congestive) heart failure: Secondary | ICD-10-CM | POA: Diagnosis not present

## 2015-03-19 DIAGNOSIS — H4311 Vitreous hemorrhage, right eye: Secondary | ICD-10-CM | POA: Diagnosis not present

## 2015-03-20 DIAGNOSIS — F329 Major depressive disorder, single episode, unspecified: Secondary | ICD-10-CM | POA: Diagnosis not present

## 2015-03-20 DIAGNOSIS — I2510 Atherosclerotic heart disease of native coronary artery without angina pectoris: Secondary | ICD-10-CM | POA: Diagnosis not present

## 2015-03-20 DIAGNOSIS — I5032 Chronic diastolic (congestive) heart failure: Secondary | ICD-10-CM | POA: Diagnosis not present

## 2015-03-20 DIAGNOSIS — J449 Chronic obstructive pulmonary disease, unspecified: Secondary | ICD-10-CM | POA: Diagnosis not present

## 2015-03-20 DIAGNOSIS — S42201D Unspecified fracture of upper end of right humerus, subsequent encounter for fracture with routine healing: Secondary | ICD-10-CM | POA: Diagnosis not present

## 2015-03-20 DIAGNOSIS — M1990 Unspecified osteoarthritis, unspecified site: Secondary | ICD-10-CM | POA: Diagnosis not present

## 2015-03-20 DIAGNOSIS — G510 Bell's palsy: Secondary | ICD-10-CM | POA: Diagnosis not present

## 2015-03-20 DIAGNOSIS — E785 Hyperlipidemia, unspecified: Secondary | ICD-10-CM | POA: Diagnosis not present

## 2015-03-20 DIAGNOSIS — F419 Anxiety disorder, unspecified: Secondary | ICD-10-CM | POA: Diagnosis not present

## 2015-03-20 DIAGNOSIS — M5136 Other intervertebral disc degeneration, lumbar region: Secondary | ICD-10-CM | POA: Diagnosis not present

## 2015-03-20 DIAGNOSIS — I481 Persistent atrial fibrillation: Secondary | ICD-10-CM | POA: Diagnosis not present

## 2015-03-20 DIAGNOSIS — E1122 Type 2 diabetes mellitus with diabetic chronic kidney disease: Secondary | ICD-10-CM | POA: Diagnosis not present

## 2015-03-20 DIAGNOSIS — N183 Chronic kidney disease, stage 3 (moderate): Secondary | ICD-10-CM | POA: Diagnosis not present

## 2015-03-21 ENCOUNTER — Ambulatory Visit (INDEPENDENT_AMBULATORY_CARE_PROVIDER_SITE_OTHER): Payer: Medicare Other | Admitting: *Deleted

## 2015-03-21 DIAGNOSIS — Z5181 Encounter for therapeutic drug level monitoring: Secondary | ICD-10-CM | POA: Diagnosis not present

## 2015-03-21 DIAGNOSIS — I480 Paroxysmal atrial fibrillation: Secondary | ICD-10-CM | POA: Diagnosis not present

## 2015-03-21 DIAGNOSIS — I824Z9 Acute embolism and thrombosis of unspecified deep veins of unspecified distal lower extremity: Secondary | ICD-10-CM

## 2015-03-21 DIAGNOSIS — Z7901 Long term (current) use of anticoagulants: Secondary | ICD-10-CM | POA: Diagnosis not present

## 2015-03-21 LAB — POCT INR: INR: 2.6

## 2015-03-26 DIAGNOSIS — I481 Persistent atrial fibrillation: Secondary | ICD-10-CM | POA: Diagnosis not present

## 2015-03-26 DIAGNOSIS — E785 Hyperlipidemia, unspecified: Secondary | ICD-10-CM | POA: Diagnosis not present

## 2015-03-26 DIAGNOSIS — J449 Chronic obstructive pulmonary disease, unspecified: Secondary | ICD-10-CM | POA: Diagnosis not present

## 2015-03-26 DIAGNOSIS — I5032 Chronic diastolic (congestive) heart failure: Secondary | ICD-10-CM | POA: Diagnosis not present

## 2015-03-26 DIAGNOSIS — E1122 Type 2 diabetes mellitus with diabetic chronic kidney disease: Secondary | ICD-10-CM | POA: Diagnosis not present

## 2015-03-26 DIAGNOSIS — M1990 Unspecified osteoarthritis, unspecified site: Secondary | ICD-10-CM | POA: Diagnosis not present

## 2015-03-26 DIAGNOSIS — I2510 Atherosclerotic heart disease of native coronary artery without angina pectoris: Secondary | ICD-10-CM | POA: Diagnosis not present

## 2015-03-26 DIAGNOSIS — G510 Bell's palsy: Secondary | ICD-10-CM | POA: Diagnosis not present

## 2015-03-26 DIAGNOSIS — N183 Chronic kidney disease, stage 3 (moderate): Secondary | ICD-10-CM | POA: Diagnosis not present

## 2015-03-26 DIAGNOSIS — M5136 Other intervertebral disc degeneration, lumbar region: Secondary | ICD-10-CM | POA: Diagnosis not present

## 2015-03-26 DIAGNOSIS — S42201D Unspecified fracture of upper end of right humerus, subsequent encounter for fracture with routine healing: Secondary | ICD-10-CM | POA: Diagnosis not present

## 2015-03-27 DIAGNOSIS — S42291D Other displaced fracture of upper end of right humerus, subsequent encounter for fracture with routine healing: Secondary | ICD-10-CM | POA: Diagnosis not present

## 2015-03-27 DIAGNOSIS — Z96611 Presence of right artificial shoulder joint: Secondary | ICD-10-CM | POA: Diagnosis not present

## 2015-04-04 DIAGNOSIS — M25511 Pain in right shoulder: Secondary | ICD-10-CM | POA: Diagnosis not present

## 2015-04-05 ENCOUNTER — Encounter: Payer: Self-pay | Admitting: Family Medicine

## 2015-04-05 ENCOUNTER — Ambulatory Visit (INDEPENDENT_AMBULATORY_CARE_PROVIDER_SITE_OTHER): Payer: Medicare Other | Admitting: Family Medicine

## 2015-04-05 VITALS — BP 122/82 | HR 82 | Temp 98.1°F | Ht 66.0 in | Wt 215.0 lb

## 2015-04-05 DIAGNOSIS — S0093XD Contusion of unspecified part of head, subsequent encounter: Secondary | ICD-10-CM | POA: Diagnosis not present

## 2015-04-05 NOTE — Progress Notes (Signed)
Pre visit review using our clinic review tool, if applicable. No additional management support is needed unless otherwise documented below in the visit note. 

## 2015-04-05 NOTE — Progress Notes (Signed)
Subjective:    Patient ID: [name], female    DOB: 11/13/1945, 70 y.o.   MRN: [id number]  HPI Patient seen with right parietal skull pain.   Back in October she was visiting her son up in [place] and was going up some stairs with both arms full and fell and landed basically against the right side of her face and head. She was seen in the hospital there with CT head no acute findings. She had lacerations around her right lateral orbit. She recalled having some right skull tenderness  but no lacerations over the actual scalp. Since then she's had some slow improvements but still some soreness to touch right parietal area improved with ice. No rash.  She subsequently had shoulder surgery here back in November and had some confusion afterwards and had another repeat CT which again showed no acute findings. She has no confusion at this point. No chest pains. No focal weakness  Past Medical History  Diagnosis Date  . DYSLIPIDEMIA 02/27/2009  . DEPRESSION 06/13/2008  . ALLERGIC RHINITIS 08/01/2009  . ABDOMINAL PAIN RIGHT UPPER QUADRANT 11/08/2008  . CARDIOVASCULAR FUNCTION STUDY, ABNORMAL 04/04/2009  . NON-HODGKIN'S LYMPHOMA, HX OF dx  2005--  chemoradiation completed 2006--  no recurrence    onocologist- dr odogwu--   . OSTEOARTHRITIS, MODERATE 04/07/2010  . CAD, NATIVE VESSEL 05/30/2009    cardiologist- dr Aundra Dubin- visit 04-13-2010 in epic-- denies cardiac symptoms  . DYSPNEA 02/27/2009  . Retroperitoneal fibrosis   . Frequency of urination   . History of Lyme disease   . History of Bell's palsy   . DDD (degenerative disc disease), lumbar   . GERD (gastroesophageal reflux disease)   . Anxiety   . 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 History of DVT of lower extremity 2006-- CHRONIC COUMADIN THERAPY  . Anticoagulated on Coumadin   . A-fib [facility])    Past Surgical History  Procedure Laterality Date  . Multiple cysto/ left ureteral stent exchanges   LAST ONE 10-28-10  . Total knee arthroplasty  OCT 2010    RIGHT  . Total knee arthroplasty  JAN 2010    LEFT  . Knee arthroscopy  2005    RIGHT  . Breast mass excision      BENIGN-- RIGHT BREAST  . Retroperitoneal bx  2007  . Exploratory laparotomy  2005    LYMPHOMA  . Tonsillectomy  CHILD  . Cardiovascular stress test  04-04-2009-- PER PT ASYMPTOMATIC-- MEDICAL MANAGEMENT    STUDY WAS EQUIVOCAL/ EF 49%/ GLOBAL HYPOKINESIS/ APPEARS TO BE A MILD ANTERIOR PERFUSION DEFECT COULD REPRESENT ISCHEMIA BUT DUE TO  ACTIVITY WORSE IN STRESS THAN AT REST POSS. THE DEFECT WAS ARTIFACTUAL  . Transthoracic echocardiogram  [id number]    MILD SYSTOLIC DYSFUNCTION/ EF 99991111 MODERATE DIASTOLIC DYSFUCTION/ MILD MR/ MILD BIATRIAL ENLARGEMENT  . Ct angiogram  FEB 2011    CALCIUM SCORE 161/ POSS. MODERATE MID LAD STENOSIS  . Cystoscopy w/ ureteral stent removal  04/28/2011    Procedure: CYSTOSCOPY WITH STENT REMOVAL;  Surgeon: Fredricka Bonine, MD;  Location: [facility];  Service: Urology;;  . Cystoscopy w/ retrogrades  04/28/2011    Procedure: CYSTOSCOPY WITH RETROGRADE PYELOGRAM;  Surgeon: Fredricka Bonine, MD;  Location: [facility];  Service: Urology;  Laterality: Left;  . Reverse shoulder arthroplasty Right 01/31/2015    Procedure: RIGHT REVERSE SHOULDER ARTHROPLASTY;  Surgeon: Justice Britain, MD;  Location: [place];  Service: Orthopedics;  Laterality:  Right;    reports that she quit smoking about 7 years ago. Her smoking use included Cigarettes. She has a 104 pack-year smoking history. She has never used smokeless tobacco. She reports that she drinks alcohol. She reports that she does not use illicit drugs. family history includes Arthritis in her mother; Diabetes in her sister; Heart attack in her mother and sister; Heart disease in her mother; Hypertension in her sister; Stroke in her sister. Allergies  Allergen Reactions  . Chlorhexidine Base Itching    CHG  WIPES  . Penicillins Hives and Rash      Review of Systems  Constitutional: Negative for fever and chills.  Respiratory: Negative for cough and shortness of breath.   Cardiovascular: Negative for chest pain.  Neurological: Negative for dizziness, seizures, syncope and headaches.  Psychiatric/Behavioral: Negative for confusion.       Objective:   Physical Exam  Constitutional: She is oriented to person, place, and time. She appears well-developed and well-nourished.  HENT:  She has some scarring right lateral orbit region from recent laceration. Well healed. Right parietal skull is nontender to palpation. No visible rash. No palpable deformity  Eyes: EOM are normal. Pupils are equal, round, and reactive to light.  Neck: Neck supple.  Cardiovascular: Normal rate and regular rhythm.   Pulmonary/Chest: Effort normal and breath sounds normal. No respiratory distress. She has no wheezes. She has no rales.  Neurological: She is alert and oriented to person, place, and time. No cranial nerve deficit.  Psychiatric: She has a normal mood and affect. Her behavior is normal. Judgment and thought content normal.          Assessment & Plan:  Right scalp pain/skull pain. History of recent injury as above. She does not have any worrisome symptoms. Her symptoms are actually slowly improving. She's had 2 CT scans now which did not show evidence for fracture-or intracranial acute abnormality. Since this is slowly improving we recommend observation.  Flu vaccine given. We have strongly advised scheduling complete physical for later this year

## 2015-04-05 NOTE — Patient Instructions (Signed)
Schedule physical exam at some point later this year.

## 2015-04-08 ENCOUNTER — Telehealth: Payer: Self-pay | Admitting: Internal Medicine

## 2015-04-08 NOTE — Telephone Encounter (Signed)
Spoke to patient husband about change for 2/7 appointment to 2/14 due to [facility]

## 2015-04-11 DIAGNOSIS — M25511 Pain in right shoulder: Secondary | ICD-10-CM | POA: Diagnosis not present

## 2015-04-12 ENCOUNTER — Ambulatory Visit (INDEPENDENT_AMBULATORY_CARE_PROVIDER_SITE_OTHER): Payer: Medicare Other | Admitting: *Deleted

## 2015-04-12 DIAGNOSIS — I480 Paroxysmal atrial fibrillation: Secondary | ICD-10-CM | POA: Diagnosis not present

## 2015-04-12 DIAGNOSIS — Z7901 Long term (current) use of anticoagulants: Secondary | ICD-10-CM | POA: Diagnosis not present

## 2015-04-12 DIAGNOSIS — Z5181 Encounter for therapeutic drug level monitoring: Secondary | ICD-10-CM | POA: Diagnosis not present

## 2015-04-12 DIAGNOSIS — I824Z9 Acute embolism and thrombosis of unspecified deep veins of unspecified distal lower extremity: Secondary | ICD-10-CM

## 2015-04-12 LAB — POCT INR: INR: 2

## 2015-04-13 DIAGNOSIS — Z13820 Encounter for screening for osteoporosis: Secondary | ICD-10-CM | POA: Diagnosis not present

## 2015-04-13 DIAGNOSIS — M899 Disorder of bone, unspecified: Secondary | ICD-10-CM | POA: Diagnosis not present

## 2015-04-13 LAB — HM DEXA SCAN

## 2015-04-15 DIAGNOSIS — M25511 Pain in right shoulder: Secondary | ICD-10-CM | POA: Diagnosis not present

## 2015-04-16 DIAGNOSIS — H43811 Vitreous degeneration, right eye: Secondary | ICD-10-CM | POA: Diagnosis not present

## 2015-04-18 DIAGNOSIS — M25511 Pain in right shoulder: Secondary | ICD-10-CM | POA: Diagnosis not present

## 2015-04-19 ENCOUNTER — Telehealth: Payer: Self-pay

## 2015-04-19 NOTE — Telephone Encounter (Signed)
Prior auth for Livalo 2mg sent to [facility] 

## 2015-04-22 ENCOUNTER — Telehealth: Payer: Self-pay

## 2015-04-22 ENCOUNTER — Other Ambulatory Visit: Payer: Self-pay | Admitting: Medical Oncology

## 2015-04-22 DIAGNOSIS — I82402 Acute embolism and thrombosis of unspecified deep veins of left lower extremity: Secondary | ICD-10-CM

## 2015-04-22 DIAGNOSIS — M25511 Pain in right shoulder: Secondary | ICD-10-CM | POA: Diagnosis not present

## 2015-04-22 MED ORDER — WARFARIN SODIUM 5 MG PO TABS: ORAL_TABLET | ORAL | Status: DC

## 2015-04-22 NOTE — Telephone Encounter (Signed)
Livalo 2mg  approved.

## 2015-04-25 DIAGNOSIS — M25511 Pain in right shoulder: Secondary | ICD-10-CM | POA: Diagnosis not present

## 2015-04-29 ENCOUNTER — Other Ambulatory Visit: Payer: Self-pay

## 2015-04-29 DIAGNOSIS — Z96611 Presence of right artificial shoulder joint: Secondary | ICD-10-CM | POA: Diagnosis not present

## 2015-04-29 DIAGNOSIS — S42291D Other displaced fracture of upper end of right humerus, subsequent encounter for fracture with routine healing: Secondary | ICD-10-CM | POA: Diagnosis not present

## 2015-04-29 DIAGNOSIS — Z471 Aftercare following joint replacement surgery: Secondary | ICD-10-CM | POA: Diagnosis not present

## 2015-04-29 MED ORDER — BUPROPION HCL ER (XL) 300 MG PO TB24: 300.0000 mg | ORAL_TABLET | Freq: Every day | ORAL | Status: DC

## 2015-04-29 MED ORDER — DULOXETINE HCL 60 MG PO CPEP: 60.0000 mg | ORAL_CAPSULE | Freq: Every day | ORAL | Status: DC

## 2015-04-30 ENCOUNTER — Ambulatory Visit: Payer: Medicare Other | Admitting: Internal Medicine

## 2015-04-30 ENCOUNTER — Other Ambulatory Visit: Payer: Medicare Other

## 2015-04-30 DIAGNOSIS — M25511 Pain in right shoulder: Secondary | ICD-10-CM | POA: Diagnosis not present

## 2015-05-07 ENCOUNTER — Encounter: Payer: Self-pay | Admitting: Internal Medicine

## 2015-05-07 ENCOUNTER — Other Ambulatory Visit (HOSPITAL_BASED_OUTPATIENT_CLINIC_OR_DEPARTMENT_OTHER): Payer: Medicare Other

## 2015-05-07 ENCOUNTER — Ambulatory Visit (HOSPITAL_BASED_OUTPATIENT_CLINIC_OR_DEPARTMENT_OTHER): Payer: Medicare Other | Admitting: Internal Medicine

## 2015-05-07 VITALS — BP 123/86 | HR 72 | Temp 97.6°F | Resp 18 | Ht 66.0 in | Wt 217.5 lb

## 2015-05-07 DIAGNOSIS — I82409 Acute embolism and thrombosis of unspecified deep veins of unspecified lower extremity: Secondary | ICD-10-CM

## 2015-05-07 DIAGNOSIS — C8590 Non-Hodgkin lymphoma, unspecified, unspecified site: Secondary | ICD-10-CM

## 2015-05-07 DIAGNOSIS — Z7901 Long term (current) use of anticoagulants: Secondary | ICD-10-CM | POA: Diagnosis not present

## 2015-05-07 DIAGNOSIS — Z86718 Personal history of other venous thrombosis and embolism: Secondary | ICD-10-CM

## 2015-05-07 DIAGNOSIS — Z8572 Personal history of non-Hodgkin lymphomas: Secondary | ICD-10-CM | POA: Diagnosis not present

## 2015-05-07 DIAGNOSIS — C8333 Diffuse large B-cell lymphoma, intra-abdominal lymph nodes: Secondary | ICD-10-CM

## 2015-05-07 LAB — CBC WITH DIFFERENTIAL/PLATELET
BASO%: 0.3 % (ref 0.0–2.0)
Basophils Absolute: 0 10*3/uL (ref 0.0–0.1)
EOS%: 1.6 % (ref 0.0–7.0)
Eosinophils Absolute: 0.1 10*3/uL (ref 0.0–0.5)
HCT: 38.6 % (ref 34.8–46.6)
HGB: 12.6 g/dL (ref 11.6–15.9)
LYMPH%: 45.5 % (ref 14.0–49.7)
MCH: 29 pg (ref 25.1–34.0)
MCHC: 32.5 g/dL (ref 31.5–36.0)
MCV: 89.1 fL (ref 79.5–101.0)
MONO#: 0.5 10*3/uL (ref 0.1–0.9)
MONO%: 9.1 % (ref 0.0–14.0)
NEUT#: 2.6 10*3/uL (ref 1.5–6.5)
NEUT%: 43.5 % (ref 38.4–76.8)
Platelets: 192 10*3/uL (ref 145–400)
RBC: 4.33 10*6/uL (ref 3.70–5.45)
RDW: 15.1 % — ABNORMAL HIGH (ref 11.2–14.5)
WBC: 5.9 10*3/uL (ref 3.9–10.3)
lymph#: 2.7 10*3/uL (ref 0.9–3.3)

## 2015-05-07 LAB — PROTIME-INR
INR: 1.9 — ABNORMAL LOW (ref 2.00–3.50)
Protime: 22.8 Seconds — ABNORMAL HIGH (ref 10.6–13.4)

## 2015-05-07 LAB — COMPREHENSIVE METABOLIC PANEL
ALBUMIN: 3.6 g/dL (ref 3.5–5.0)
ALK PHOS: 101 U/L (ref 40–150)
ALT: 20 U/L (ref 0–55)
ANION GAP: 9 meq/L (ref 3–11)
AST: 20 U/L (ref 5–34)
BILIRUBIN TOTAL: 0.31 mg/dL (ref 0.20–1.20)
BUN: 21.2 mg/dL (ref 7.0–26.0)
CO2: 26 mEq/L (ref 22–29)
Calcium: 9.9 mg/dL (ref 8.4–10.4)
Chloride: 105 mEq/L (ref 98–109)
Creatinine: 1.3 mg/dL — ABNORMAL HIGH (ref 0.6–1.1)
EGFR: 42 mL/min/{1.73_m2} — AB (ref >90)
GLUCOSE: 106 mg/dL (ref 70–140)
Potassium: 4 mEq/L (ref 3.5–5.1)
Sodium: 140 mEq/L (ref 136–145)
TOTAL PROTEIN: 7.3 g/dL (ref 6.4–8.3)

## 2015-05-07 LAB — LACTATE DEHYDROGENASE: LDH: 186 U/L (ref 125–245)

## 2015-05-07 NOTE — Progress Notes (Signed)
[facility] [phone]   Fax:([phone]) [phone]  OFFICE PROGRESS NOTE  [name], MD [place] [address]  DIAGNOSIS:  1) History of stage II large B-cell non-Hodgkin lymphoma presenting with retroperitoneal lymphadenopathy complicated by retroperitoneal fibrosis and left-sided hydronephrosis diagnosed in December of 2005. 2) history of deep venous thrombosis of the left lower extremity, felt to be secondary to the retroperitoneal fibrosis first diagnosed in December 2005.  PRIOR THERAPY:  1) Status post 6 cycles of systemic chemotherapy with CHOP/Rituxan last dose was given 03/06/2005 under the care of Dr. Jamse Arn. 2) status post consolidation radiotherapy to the retroperitoneum and pelvis in 2006.  CURRENT THERAPY: Observation   INTERVAL HISTORY: [name] 70 y.o. female returns to the clinic today for annual follow-up visit. The patient is feeling fine today was no specific complaints. The patient has been on chronic treatment with Coumadin for previous history of left lower extremity deep venous thrombosis diagnosed in 2005 but this was continued by her cardiologist Dr. Aundra Dubin for atrial fibrillation. The patient is feeling fine today with no specific complaints. She denied having any significant weight loss or night sweats. She denied having any chest pain, shortness of breath, cough or hemoptysis. She has no nausea or vomiting. She had repeat CBC and comprehensive metabolic panel performed recently and she is here for evaluation and discussion of her lab results.  MEDICAL HISTORY: Past Medical History  Diagnosis Date  . DYSLIPIDEMIA 02/27/2009  . DEPRESSION 06/13/2008  . ALLERGIC RHINITIS 08/01/2009  . ABDOMINAL PAIN RIGHT UPPER QUADRANT 11/08/2008  . CARDIOVASCULAR FUNCTION STUDY, ABNORMAL 04/04/2009  . NON-HODGKIN'S LYMPHOMA, HX OF dx  2005--  chemoradiation completed 2006--  no recurrence    onocologist- dr  odogwu--   . OSTEOARTHRITIS, MODERATE 04/07/2010  . CAD, NATIVE VESSEL 05/30/2009    cardiologist- dr Aundra Dubin- visit 04-13-2010 in epic-- denies cardiac symptoms  . DYSPNEA 02/27/2009  . Retroperitoneal fibrosis   . Frequency of urination   . History of Lyme disease   . History of Bell's palsy   . DDD (degenerative disc disease), lumbar   . GERD (gastroesophageal reflux disease)   . Anxiety   . 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 History of DVT of lower extremity 2006-- CHRONIC COUMADIN THERAPY  . Anticoagulated on Coumadin   . A-fib (HCC)     ALLERGIES:  is allergic to chlorhexidine base and penicillins.  MEDICATIONS:  Current Outpatient Prescriptions  Medication Sig Dispense Refill  . ALPRAZolam (XANAX) 1 MG tablet Take 1 tablet (1 mg total) by mouth 3 (three) times daily as needed for anxiety. 30 tablet 0  . amiodarone (PACERONE) 200 MG tablet Take 1 tablet (200 mg total) by mouth daily. 30 tablet 5  . b complex vitamins capsule Take 2 capsules by mouth daily.     Marland Kitchen buPROPion (WELLBUTRIN XL) 300 MG 24 hr tablet Take 1 tablet (300 mg total) by mouth daily. 90 tablet 1  . Cholecalciferol (VITAMIN D) 2000 UNITS CAPS Take 2,000 Int'l Units by mouth.    . Coenzyme Q10-Fish Oil-Vit E (CO-Q 10 OMEGA-3 FISH OIL PO) Take 100 mg elemental calcium/kg/hr by mouth daily.    . Diclofenac Sodium (PENNSAID) 2 % SOLN Place 1 application onto the skin 3 (three) times daily as needed. 112 g 3  . diltiazem (CARDIZEM CD) 180 MG 24 hr capsule take 1 capsule by mouth twice a day 60 capsule 5  . DULoxetine (  CYMBALTA) 60 MG capsule Take 1 capsule (60 mg total) by mouth daily. 30 capsule 2  . feeding supplement, ENSURE ENLIVE, (ENSURE ENLIVE) LIQD Take 237 mLs by mouth 2 (two) times daily between meals. 60 Bottle 0  . fluticasone (FLONASE) 50 MCG/ACT nasal spray instill 2 sprays into each nostril once daily (Patient taking differently: instill 2 sprays into each nostril  once daily as needed) 16 g 6  . furosemide (LASIX) 20 MG tablet Take 1 tablet (20 mg total) by mouth daily. 90 tablet 2  . loratadine (CLARITIN) 10 MG tablet Take 10 mg by mouth daily.     Marland Kitchen Lysine 500 MG TABS Take 500 mg by mouth daily.    Marland Kitchen NATURE-THROID 113.75 MG TABS Take 1 tablet by mouth daily.  0  . NITROSTAT 0.4 MG SL tablet place 1 tablet under the tongue every 5 minutes for UP TO 3 doses if needed 25 tablet 12  . omeprazole (PRILOSEC) 20 MG capsule Take 20 mg by mouth daily.    Marland Kitchen oxyCODONE-acetaminophen (PERCOCET) 5-325 MG tablet Take 1-2 tablets by mouth every 4 (four) hours as needed. 30 tablet 0  . Pitavastatin Calcium (LIVALO) 2 MG TABS take 1 tablet by mouth once daily 30 tablet 12  . Pregnenolone Micronized POWD Take 1 capsule by mouth daily.     Marland Kitchen warfarin (COUMADIN) 5 MG tablet Take 5 mg daily except 7.5 mg on MWF or as directed 90 tablet 3   No current facility-administered medications for this visit.    SURGICAL HISTORY:  Past Surgical History  Procedure Laterality Date  . Multiple cysto/ left ureteral stent exchanges  LAST ONE 10-28-10  . Total knee arthroplasty  OCT 2010    RIGHT  . Total knee arthroplasty  JAN 2010    LEFT  . Knee arthroscopy  2005    RIGHT  . Breast mass excision      BENIGN-- RIGHT BREAST  . Retroperitoneal bx  2007  . Exploratory laparotomy  2005    LYMPHOMA  . Tonsillectomy  CHILD  . Cardiovascular stress test  04-04-2009-- PER PT ASYMPTOMATIC-- MEDICAL MANAGEMENT    STUDY WAS EQUIVOCAL/ EF 49%/ GLOBAL HYPOKINESIS/ APPEARS TO BE A MILD ANTERIOR PERFUSION DEFECT COULD REPRESENT ISCHEMIA BUT DUE TO  ACTIVITY WORSE IN STRESS THAN AT REST POSS. THE DEFECT WAS ARTIFACTUAL  . Transthoracic echocardiogram  [id number]    MILD SYSTOLIC DYSFUNCTION/ EF 99991111 MODERATE DIASTOLIC DYSFUCTION/ MILD MR/ MILD BIATRIAL ENLARGEMENT  . Ct angiogram  FEB 2011    CALCIUM SCORE 161/ POSS. MODERATE MID LAD STENOSIS  . Cystoscopy w/ ureteral stent removal   04/28/2011    Procedure: CYSTOSCOPY WITH STENT REMOVAL;  Surgeon: Fredricka Bonine, MD;  Location[facility];  Service: Urology;;  . Cystoscopy w/ retrogrades  04/28/2011    Procedure: CYSTOSCOPY WITH RETROGRADE PYELOGRAM;  Surgeon: Fredricka Bonine, MD;  Location: [facility];  Service: Urology;  Laterality: Left;  . Reverse shoulder arthroplasty Right 01/31/2015    Procedure: RIGHT REVERSE SHOULDER ARTHROPLASTY;  Surgeon: Justice Britain, MD;  Location: [place];  Service: Orthopedics;  Laterality: Right;    REVIEW OF SYSTEMS:  A comprehensive review of systems was negative.   PHYSICAL EXAMINATION: General appearance: alert, cooperative and no distress Head: Normocephalic, without obvious abnormality, atraumatic Neck: no adenopathy, no JVD, supple, symmetrical, trachea midline and thyroid not enlarged, symmetric, no tenderness/mass/nodules Lymph nodes: Cervical, supraclavicular, and axillary nodes normal. Resp: clear to auscultation bilaterally Back: symmetric,  no curvature. ROM normal. No CVA tenderness. Cardio: regular rate and rhythm, S1, S2 normal, no murmur, click, rub or gallop GI: soft, non-tender; bowel sounds normal; no masses,  no organomegaly Extremities: extremities normal, atraumatic, no cyanosis or edema Neurologic: Alert and oriented X 3, normal strength and tone. Normal symmetric reflexes. Normal coordination and gait  ECOG PERFORMANCE STATUS: 1 - Symptomatic but completely ambulatory  There were no vitals taken for this visit.  LABORATORY DATA: Lab Results  Component Value Date   WBC 5.9 05/07/2015   HGB 12.6 05/07/2015   HCT 38.6 05/07/2015   MCV 89.1 05/07/2015   PLT 192 05/07/2015      Chemistry      Component Value Date/Time   NA 134* 02/04/2015 0544   NA 141 06/26/2014 1407   K 3.5 02/04/2015 0544   K 4.4 06/26/2014 1407   CL 100* 02/04/2015 0544   CL 105 05/10/2012 1424   CO2 25 02/04/2015 0544   CO2 24  06/26/2014 1407   BUN 12 02/04/2015 0544   BUN 15.3 06/26/2014 1407   CREATININE 0.94 02/04/2015 0544   CREATININE 1.1 06/26/2014 1407   CREATININE 1.40* 06/08/2014 1653      Component Value Date/Time   CALCIUM 8.5* 02/04/2015 0544   CALCIUM 8.9 06/26/2014 1407   ALKPHOS 80 02/03/2015 1552   ALKPHOS 71 05/10/2012 1424   AST 20 02/03/2015 1552   AST 17 05/10/2012 1424   ALT 8* 02/03/2015 1552   ALT 22 05/10/2012 1424   BILITOT 0.7 02/03/2015 1552   BILITOT 0.38 05/10/2012 1424       RADIOGRAPHIC STUDIES: No results found.  ASSESSMENT AND PLAN: This is a very pleasant 70 years old white female with history of stage II large B-cell non-Hodgkin lymphoma diagnosed in December 2005 status post systemic chemotherapy with CHOP/Rituxan followed by consolidation radiotherapy to the retroperitoneal lymphadenopathy. She has been observation since that time with no evidence for disease recurrence. The patient is currently asymptomatic and the recent blood work showed no concerning abnormalities. I recommended for her to continue on observation with routine follow-up visit by her primary care physician Dr. Elease Hashimoto. I don't see a need to continue seeing the patient at regular basis at this point but will be happy to see her in the future if needed. For the atrial fibrillation, the patient will continue on Coumadin managed by Dr. Aundra Dubin. She was advised to call immediately if she has any concerning symptoms. The patient voices understanding of current disease status and treatment options and is in agreement with the current care plan.  All questions were answered. The patient knows to call the clinic with any problems, questions or concerns. We can certainly see the patient much sooner if necessary.  Disclaimer: This note was dictated with voice recognition software. Similar sounding words can inadvertently be transcribed and may not be corrected upon review.

## 2015-05-10 ENCOUNTER — Ambulatory Visit (INDEPENDENT_AMBULATORY_CARE_PROVIDER_SITE_OTHER): Payer: Medicare Other | Admitting: Internal Medicine

## 2015-05-10 DIAGNOSIS — Z7901 Long term (current) use of anticoagulants: Secondary | ICD-10-CM

## 2015-05-10 DIAGNOSIS — I480 Paroxysmal atrial fibrillation: Secondary | ICD-10-CM

## 2015-05-10 DIAGNOSIS — Z5181 Encounter for therapeutic drug level monitoring: Secondary | ICD-10-CM

## 2015-05-10 DIAGNOSIS — I824Z9 Acute embolism and thrombosis of unspecified deep veins of unspecified distal lower extremity: Secondary | ICD-10-CM

## 2015-05-27 ENCOUNTER — Other Ambulatory Visit: Payer: Self-pay | Admitting: *Deleted

## 2015-05-27 NOTE — Telephone Encounter (Signed)
[facility] [address], [place] [address] [id number] ALPRAZolam Duanne Moron) 1 MG tablet

## 2015-05-28 MED ORDER — ALPRAZOLAM 1 MG PO TABS: 1.0000 mg | ORAL_TABLET | Freq: Three times a day (TID) | ORAL | Status: DC | PRN

## 2015-05-28 NOTE — Telephone Encounter (Signed)
Last refill was 02-06-15 #30, [name] Last seen on 04-05-2015 Please advise on refill

## 2015-05-28 NOTE — Telephone Encounter (Signed)
Refill once.  I would like for her to use very sparingly only for severe anxiety symptoms.

## 2015-05-28 NOTE — Telephone Encounter (Signed)
Rx called in to pharmacy. Called and spoke with pt and advised of Dr. Erick Blinks recommendations.  Pt verbalized understanding and states she only uses medication as needed.

## 2015-05-31 ENCOUNTER — Ambulatory Visit (INDEPENDENT_AMBULATORY_CARE_PROVIDER_SITE_OTHER): Payer: Medicare Other | Admitting: Family Medicine

## 2015-05-31 DIAGNOSIS — R7989 Other specified abnormal findings of blood chemistry: Secondary | ICD-10-CM

## 2015-05-31 DIAGNOSIS — Z0000 Encounter for general adult medical examination without abnormal findings: Secondary | ICD-10-CM

## 2015-05-31 DIAGNOSIS — Z23 Encounter for immunization: Secondary | ICD-10-CM

## 2015-05-31 LAB — CBC WITH DIFFERENTIAL/PLATELET
BASOS ABS: 0 10*3/uL (ref 0.0–0.1)
Basophils Relative: 0.3 % (ref 0.0–3.0)
EOS PCT: 1.1 % (ref 0.0–5.0)
Eosinophils Absolute: 0.1 10*3/uL (ref 0.0–0.7)
HEMATOCRIT: 36.1 % (ref 36.0–46.0)
Hemoglobin: 12.1 g/dL (ref 12.0–15.0)
LYMPHS PCT: 35.7 % (ref 12.0–46.0)
Lymphs Abs: 2.4 10*3/uL (ref 0.7–4.0)
MCHC: 33.6 g/dL (ref 30.0–36.0)
MCV: 87.2 fl (ref 78.0–100.0)
MONOS PCT: 8.5 % (ref 3.0–12.0)
Monocytes Absolute: 0.6 10*3/uL (ref 0.1–1.0)
Neutro Abs: 3.7 10*3/uL (ref 1.4–7.7)
Neutrophils Relative %: 54.4 % (ref 43.0–77.0)
Platelets: 228 10*3/uL (ref 150.0–400.0)
RBC: 4.14 Mil/uL (ref 3.87–5.11)
RDW: 15 % (ref 11.5–15.5)
WBC: 6.8 10*3/uL (ref 4.0–10.5)

## 2015-05-31 LAB — BASIC METABOLIC PANEL
BUN: 28 mg/dL — ABNORMAL HIGH (ref 6–23)
CALCIUM: 9.6 mg/dL (ref 8.4–10.5)
CO2: 29 meq/L (ref 19–32)
CREATININE: 1.18 mg/dL (ref 0.40–1.20)
Chloride: 102 mEq/L (ref 96–112)
GFR: 47.96 mL/min — AB (ref >60.00)
GLUCOSE: 123 mg/dL — AB (ref 70–99)
Potassium: 5.1 mEq/L (ref 3.5–5.1)
SODIUM: 139 meq/L (ref 135–145)

## 2015-05-31 LAB — HEPATIC FUNCTION PANEL
ALK PHOS: 95 U/L (ref 39–117)
ALT: 16 U/L (ref 0–35)
AST: 17 U/L (ref 0–37)
Albumin: 4.1 g/dL (ref 3.5–5.2)
BILIRUBIN DIRECT: 0.1 mg/dL (ref 0.0–0.3)
TOTAL PROTEIN: 6.7 g/dL (ref 6.0–8.3)
Total Bilirubin: 0.4 mg/dL (ref 0.2–1.2)

## 2015-05-31 LAB — TSH: TSH: 6.28 u[IU]/mL — ABNORMAL HIGH (ref 0.35–4.50)

## 2015-05-31 LAB — LIPID PANEL
CHOL/HDL RATIO: 4
Cholesterol: 204 mg/dL — ABNORMAL HIGH (ref 0–200)
HDL: 56.4 mg/dL (ref >39.00)
LDL Cholesterol: 130 mg/dL — ABNORMAL HIGH (ref 0–99)
NONHDL: 147.99
TRIGLYCERIDES: 91 mg/dL (ref 0.0–149.0)
VLDL: 18.2 mg/dL (ref 0.0–40.0)

## 2015-05-31 LAB — HEMOGLOBIN A1C: Hgb A1c MFr Bld: 6.7 % — ABNORMAL HIGH (ref 4.6–6.5)

## 2015-05-31 MED ORDER — ALPRAZOLAM 1 MG PO TABS: 1.0000 mg | ORAL_TABLET | Freq: Three times a day (TID) | ORAL | Status: DC | PRN

## 2015-05-31 NOTE — Progress Notes (Signed)
Subjective:    Patient ID: [name], female    DOB: 1945-09-29, 70 y.o.   MRN: [id number]  HPI  patient here for physical exam.  Chronic problems include history of osteoarthritis, obesity, non-Hodgkin's lymphoma, hyperlipidemia, history of DVT lower extremity, history of chronic and recurrent depression, chronic diastolic heart failure, CAD, atrial fibrillation, and chronic anxiety. Medications reviewed. Compliant with all with the exception of currently not taking statin. She had intolerance with multiple generics. She is currently on Livalo but has had difficulties because of cost.   Last colonoscopy unknown but estimated 5 years ago. No history of Prevnar 13. She is not sure coverage for shingles vaccine. She declines mammograms. Tetanus up-to-date.   She had right shoulder replacement several months ago and recovered well since then.  Past Medical History  Diagnosis Date  . DYSLIPIDEMIA 02/27/2009  . DEPRESSION 06/13/2008  . ALLERGIC RHINITIS 08/01/2009  . ABDOMINAL PAIN RIGHT UPPER QUADRANT 11/08/2008  . CARDIOVASCULAR FUNCTION STUDY, ABNORMAL 04/04/2009  . NON-HODGKIN'S LYMPHOMA, HX OF dx  2005--  chemoradiation completed 2006--  no recurrence    onocologist- dr odogwu--   . OSTEOARTHRITIS, MODERATE 04/07/2010  . CAD, NATIVE VESSEL 05/30/2009    cardiologist- dr Aundra Dubin- visit 04-13-2010 in epic-- denies cardiac symptoms  . DYSPNEA 02/27/2009  . Retroperitoneal fibrosis   . Frequency of urination   . History of Lyme disease   . History of Bell's palsy   . DDD (degenerative disc disease), lumbar   . GERD (gastroesophageal reflux disease)   . Anxiety   . 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 History of DVT of lower extremity 2006-- CHRONIC COUMADIN THERAPY  . Anticoagulated on Coumadin   . A-fib [facility])    Past Surgical History  Procedure Laterality Date  . Multiple cysto/ left ureteral stent exchanges  LAST ONE 10-28-10  . Total knee  arthroplasty  OCT 2010    RIGHT  . Total knee arthroplasty  JAN 2010    LEFT  . Knee arthroscopy  2005    RIGHT  . Breast mass excision      BENIGN-- RIGHT BREAST  . Retroperitoneal bx  2007  . Exploratory laparotomy  2005    LYMPHOMA  . Tonsillectomy  CHILD  . Cardiovascular stress test  04-04-2009-- PER PT ASYMPTOMATIC-- MEDICAL MANAGEMENT    STUDY WAS EQUIVOCAL/ EF 49%/ GLOBAL HYPOKINESIS/ APPEARS TO BE A MILD ANTERIOR PERFUSION DEFECT COULD REPRESENT ISCHEMIA BUT DUE TO  ACTIVITY WORSE IN STRESS THAN AT REST POSS. THE DEFECT WAS ARTIFACTUAL  . Transthoracic echocardiogram  [id number]    MILD SYSTOLIC DYSFUNCTION/ EF 99991111 MODERATE DIASTOLIC DYSFUCTION/ MILD MR/ MILD BIATRIAL ENLARGEMENT  . Ct angiogram  FEB 2011    CALCIUM SCORE 161/ POSS. MODERATE MID LAD STENOSIS  . Cystoscopy w/ ureteral stent removal  04/28/2011    Procedure: CYSTOSCOPY WITH STENT REMOVAL;  Surgeon: Fredricka Bonine, MD;  [facility]  Service: Urology;;  . Cystoscopy w/ retrogrades  04/28/2011    Procedure: CYSTOSCOPY WITH RETROGRADE PYELOGRAM;  Surgeon: Fredricka Bonine, MD;  Location: [facility];  Service: Urology;  Laterality: Left;  . Reverse shoulder arthroplasty Right 01/31/2015    Procedure: RIGHT REVERSE SHOULDER ARTHROPLASTY;  Surgeon: Justice Britain, MD;  Location: [place];  Service: Orthopedics;  Laterality: Right;    reports that she quit smoking about 7 years ago. Her smoking use included Cigarettes. She has a 104 pack-year smoking history. She has never used  smokeless tobacco. She reports that she drinks alcohol. She reports that she does not use illicit drugs. family history includes Arthritis in her mother; Diabetes in her sister; Heart attack in her mother and sister; Heart disease in her mother; Hypertension in her sister; Stroke in her sister. Allergies  Allergen Reactions  . Chlorhexidine Base Itching    CHG WIPES  . Penicillins Hives and  Rash      Review of Systems  Constitutional: Negative for fever, activity change, appetite change, fatigue and unexpected weight change.  HENT: Negative for ear pain, hearing loss, sore throat and trouble swallowing.   Eyes: Negative for visual disturbance.  Respiratory: Negative for cough and shortness of breath.   Cardiovascular: Negative for chest pain and palpitations.  Gastrointestinal: Negative for nausea, vomiting, abdominal pain, diarrhea, constipation and blood in stool.  Endocrine: Negative for polydipsia and polyuria.  Genitourinary: Negative for dysuria and hematuria.  Musculoskeletal: Positive for arthralgias. Negative for myalgias and back pain.  Skin: Negative for rash.  Neurological: Negative for dizziness, syncope and headaches.  Hematological: Negative for adenopathy.  Psychiatric/Behavioral: Negative for confusion and dysphoric mood.       Objective:   Physical Exam  Constitutional: She is oriented to person, place, and time. She appears well-developed and well-nourished.  HENT:  Head: Normocephalic and atraumatic.  Eyes: EOM are normal. Pupils are equal, round, and reactive to light.  Neck: Normal range of motion. Neck supple. No thyromegaly present.  Cardiovascular: Normal rate, regular rhythm and normal heart sounds.   No murmur heard. Pulmonary/Chest: Breath sounds normal. No respiratory distress. She has no wheezes. She has no rales.  Abdominal: Soft. Bowel sounds are normal. She exhibits no distension and no mass. There is no tenderness. There is no rebound and no guarding.  Genitourinary:  Patient declines  Musculoskeletal: Normal range of motion. She exhibits no edema.  Lymphadenopathy:    She has no cervical adenopathy.  Neurological: She is alert and oriented to person, place, and time. She displays normal reflexes. No cranial nerve deficit.  Skin: No rash noted.  Psychiatric: She has a normal mood and affect. Her behavior is normal. Judgment and  thought content normal.          Assessment & Plan:   Physical exam. Patient is encouraged to lose some weight. Prevnar 13 given. She will check on coverage for shingles vaccine. Confirm date of last colonoscopy. We discussed possible stool DNA-based assay.   Obtain screening lab work including hepatitis C. Patient declines further mammograms or bone density scans.

## 2015-05-31 NOTE — Addendum Note (Signed)
Addended by: Elio Forget on: 05/31/2015 11:31 AM   Modules accepted: Orders

## 2015-05-31 NOTE — Progress Notes (Signed)
Pre visit review using our clinic review tool, if applicable. No additional management support is needed unless otherwise documented below in the visit note. 

## 2015-05-31 NOTE — Patient Instructions (Signed)
Check on date for follow up colonoscopy with GI Consider check on coverage for shingles vaccine- if interested.

## 2015-06-01 LAB — HEPATITIS C ANTIBODY: HCV AB: NEGATIVE

## 2015-06-04 ENCOUNTER — Ambulatory Visit (INDEPENDENT_AMBULATORY_CARE_PROVIDER_SITE_OTHER): Payer: Medicare Other | Admitting: *Deleted

## 2015-06-04 ENCOUNTER — Encounter: Payer: Self-pay | Admitting: Cardiology

## 2015-06-04 ENCOUNTER — Ambulatory Visit (INDEPENDENT_AMBULATORY_CARE_PROVIDER_SITE_OTHER): Payer: Medicare Other | Admitting: Cardiology

## 2015-06-04 VITALS — BP 120/80 | HR 80 | Ht 66.0 in | Wt 219.0 lb

## 2015-06-04 DIAGNOSIS — I2510 Atherosclerotic heart disease of native coronary artery without angina pectoris: Secondary | ICD-10-CM

## 2015-06-04 DIAGNOSIS — Z7901 Long term (current) use of anticoagulants: Secondary | ICD-10-CM

## 2015-06-04 DIAGNOSIS — Z5181 Encounter for therapeutic drug level monitoring: Secondary | ICD-10-CM | POA: Diagnosis not present

## 2015-06-04 DIAGNOSIS — E785 Hyperlipidemia, unspecified: Secondary | ICD-10-CM | POA: Diagnosis not present

## 2015-06-04 DIAGNOSIS — I4891 Unspecified atrial fibrillation: Secondary | ICD-10-CM

## 2015-06-04 DIAGNOSIS — I5032 Chronic diastolic (congestive) heart failure: Secondary | ICD-10-CM

## 2015-06-04 DIAGNOSIS — I824Z9 Acute embolism and thrombosis of unspecified deep veins of unspecified distal lower extremity: Secondary | ICD-10-CM | POA: Diagnosis not present

## 2015-06-04 DIAGNOSIS — I480 Paroxysmal atrial fibrillation: Secondary | ICD-10-CM | POA: Diagnosis not present

## 2015-06-04 LAB — HEPATIC FUNCTION PANEL
ALT: 18 U/L (ref 6–29)
AST: 18 U/L (ref 10–35)
Albumin: 3.9 g/dL (ref 3.6–5.1)
Alkaline Phosphatase: 97 U/L (ref 33–130)
BILIRUBIN DIRECT: 0.1 mg/dL (ref <=0.2)
BILIRUBIN TOTAL: 0.4 mg/dL (ref 0.2–1.2)
Indirect Bilirubin: 0.3 mg/dL (ref 0.2–1.2)
Total Protein: 6.7 g/dL (ref 6.1–8.1)

## 2015-06-04 LAB — BASIC METABOLIC PANEL
BUN: 21 mg/dL (ref 7–25)
CO2: 24 mmol/L (ref 20–31)
CREATININE: 1.11 mg/dL — AB (ref 0.60–0.93)
Calcium: 9.3 mg/dL (ref 8.6–10.4)
Chloride: 103 mmol/L (ref 98–110)
Glucose, Bld: 118 mg/dL — ABNORMAL HIGH (ref 65–99)
Potassium: 4.8 mmol/L (ref 3.5–5.3)
Sodium: 141 mmol/L (ref 135–146)

## 2015-06-04 LAB — POCT INR: INR: 3.1

## 2015-06-04 MED ORDER — PITAVASTATIN CALCIUM 2 MG PO TABS: ORAL_TABLET | ORAL | Status: DC

## 2015-06-04 NOTE — Patient Instructions (Addendum)
Medication Instructions:  Your physician has recommended you make the following change in your medication:  Start Livalo 2 mg by mouth daily.   Labwork: Lab work to be done today--BMP, Liver, TSH, free T4, T3  Your physician recommends that you return for lab work in: 2 months--Lipid and liver profiles.   Testing/Procedures: Please schedule patient for weekly INR checks.  Cardioversion to be arranged after 3 weeks of therapeutic INR levels  Follow-Up: Your physician recommends that you schedule a follow-up appointment in: 6 weeks.     Any Other Special Instructions Will Be Listed Below (If Applicable).     If you need a refill on your cardiac medications before your next appointment, please call your pharmacy.  Marland Kitchen

## 2015-06-05 LAB — T3, FREE: T3 FREE: 2.4 pg/mL (ref 2.3–4.2)

## 2015-06-05 LAB — TSH: TSH: 5.93 m[IU]/L — AB

## 2015-06-05 LAB — T4, FREE: Free T4: 1 ng/dL (ref 0.8–1.8)

## 2015-06-06 NOTE — Progress Notes (Signed)
Patient ID: [name], female   DOB: 02-23-1946, 70 y.o.   MRN: [id number] PCP: Dr. Elease Hashimoto  70 yo with history of retroperitoneal fibrosis and non-Hodgkin's lymphoma as well as prior DVT presented initially for evaluation of an abnormal myoview.  A Lexiscan myoview was done in 1/11. This was an equivocal study due to subdiaphragmatic nuclear uptake. EF was calculated to be 49% with global hypokinesis. Given the equivocal myoview, I had her do a coronary CT angiogram. This showed a moderate calcium score of 161 (putting her at a high risk percentile for her age) and a possible moderate mid LAD stenosis (though some degradation of images by respiratory artifact). Echo was done to reassess LV systolic function (reported as mildly decreased on myoview). This confirmed mild LV systolic dysfunction, EF [id number] with moderate diastolic dysfunction. We discussed catheterization, but patient strongly preferred medical treatment.   She was diagnosed with paroxysmal atrial fibrillation.  She was started on warfarin and diltiazem CD.  Last echo in 8/15 showed EF 50-55%, moderate LV hypertrophy, probably mild mitral stenosis, normal RV size and systolic function.    She was initially on dronedarone to try to maintain NSR, but developed persistent atrial fibrillation.  I changed her antiarrhythmic to amiodarone.  She has been on amiodarone since 7/16.  We had planned on a trial of cardioversion after therapeutic INR was obtained for > 1 month, but in the interim she fell and broke her right shoulder and had shoulder replacement.  She never had DCCV done.  She remains in atrial fibrillation today. She is not very active but does not get short of breath walking short distances.  Dyspnea with stairs.  No chest pain.  She does not feel palpitations.   Labs (12/10): HCT 34.5, K 4.3, creatinine 1.2, LDL 101, HDL 46  Labs (7/11): LFTs normal, LDL 69, HDL 47, BNP 21, K 4.3, creatinine 1.2  Labs (1/12): K 5.2,  creatinine 1.0, LFTs normal, LDL 59, HDL 46  Labs (6/13): LDL 54, HDL 52, K 4.7, creatinine 1.1 Labs (9/14): LDL 79, HDL 56 Labs (4/15): TSH normal Labs (11/15): K 5.2, creatinine 1.2, LDL 69, HDL 63 Labs (3/16): BNP 168, K 4, creatinine 1.08 Labs (4/16): K 4.1, creatinine 1.12 Labs (7/16): HCT 36.9, LFTs normal, TSH normal Labs (3/17): K 5.1, creatinine 1.18, LDL 130, HDL 56, HT 36.4, LFTs normal, TSH mildly elevated  ECG: atrial fibrillation at 70  Past Medical History:   1. Anxiety Disorder  2. Arthritis  3. Depression  4. Retroperitoneal fibrosis with non-Hodgkin's lymphoma. This was treated with chemotherapy in 2006 and is in remission.  5. Adenomatous Colon Polyps  6. Diverticulosis  7. DVT left leg/thigh: in both 2005 and 2006, associated with lymphoma. She is on chronic coumadin.  8. Obesity  9. Left ureteral stent (ureter involved by retroperitoneal fibrosis).  10. CAD: Lexiscan myoview (1/11): EF 49% with global hypokinesis. There appeared to be a mild anterior perfusion defect that could represent ischemia. However, there was significant subdiaphragmatic nuclear activity that was worse in stress than rest so it is possible that the defect was artifactual. This study was equivocal. Coronary CT angiogram was done to follow this up in 2/11. This showed calcium score of 161 (86th percentile for her age and race) and a possible moderate mid LAD stenosis.  11. Mild systolic dysfunction: EF [id number] on myoview. Echo (2/11): EF 45-50%, moderate diastolic dysfunction, mild MR, mild biatrial enlargement, PASP 34 mmHg. It is possible that this could be  chemotherapy-related.  Echo (11/14): EF 50-55%, grade II diastolic dysfunction, ?moderate mitral stenosis, no MR. Echo (8/15): EF 50-55%, moderate LVH, probably mild mitral stenosis with mean gradient 2, normal PA pressure, normal RV.  12. Hyperlipidemia: myalgias with Crestor.  13. Atrial fibrillation: Persistent.  14. Cough with ACEI 15. PFTs  (7/16): Near normal with minimal obstructive airways disease noted.  16. Right shoulder replacement 2016.   Family History:  Family History of Arthritis mother  Family History Diabetes 1st degree relative deceased sister  Family History Hypertension deceased sister  Family History of Stroke F 1st degree relative <60 deceased sister  Family History of Cardiovascular disorder mother  Sister had MI and CVA at 66  Mother with CABG in her 34s   Social History:  Retired  Married  Quit smoking 5/10  Alcohol use-yes, rarely  Regular exercise-no   Review of Systems  All systems reviewed and negative except as per HPI.   Current Outpatient Prescriptions  Medication Sig Dispense Refill  . ALPRAZolam (XANAX) 1 MG tablet Take 1 tablet (1 mg total) by mouth 3 (three) times daily as needed for anxiety. 90 tablet 5  . amiodarone (PACERONE) 200 MG tablet Take 1 tablet (200 mg total) by mouth daily. 30 tablet 5  . b complex vitamins capsule Take 2 capsules by mouth daily.     Marland Kitchen buPROPion (WELLBUTRIN XL) 300 MG 24 hr tablet Take 1 tablet (300 mg total) by mouth daily. 90 tablet 1  . Cholecalciferol (VITAMIN D) 2000 UNITS CAPS Take 2,000 Int'l Units by mouth.    . Coenzyme Q10-Fish Oil-Vit E (CO-Q 10 OMEGA-3 FISH OIL PO) Take 100 mg elemental calcium/kg/hr by mouth daily.    Marland Kitchen diltiazem (CARDIZEM CD) 180 MG 24 hr capsule take 1 capsule by mouth twice a day 60 capsule 5  . DULoxetine (CYMBALTA) 60 MG capsule Take 1 capsule (60 mg total) by mouth daily. 30 capsule 2  . fluticasone (FLONASE) 50 MCG/ACT nasal spray instill 2 sprays into each nostril once daily (Patient taking differently: instill 2 sprays into each nostril once daily as needed) 16 g 6  . furosemide (LASIX) 20 MG tablet Take 1 tablet (20 mg total) by mouth daily. 90 tablet 2  . loratadine (CLARITIN) 10 MG tablet Take 10 mg by mouth daily.     Marland Kitchen Lysine 500 MG TABS Take 500 mg by mouth daily.    Marland Kitchen NATURE-THROID 113.75 MG TABS Take 1 tablet  by mouth daily.  0  . NITROSTAT 0.4 MG SL tablet place 1 tablet under the tongue every 5 minutes for UP TO 3 doses if needed 25 tablet 12  . oxyCODONE-acetaminophen (PERCOCET) 5-325 MG tablet Take 1-2 tablets by mouth every 4 (four) hours as needed. 30 tablet 0  . Pitavastatin Calcium (LIVALO) 2 MG TABS take 1 tablet by mouth once daily 30 tablet 12  . Pregnenolone Micronized POWD Take 1 capsule by mouth daily.     Marland Kitchen warfarin (COUMADIN) 5 MG tablet Take 5 mg daily except 7.5 mg on MWF or as directed (Patient taking differently: Take 5 mg by mouth. Take 5 mg daily except 2.5 on Sunday and Tuesdays) 90 tablet 3   No current facility-administered medications for this visit.   BP 120/80 mmHg  Pulse 80  Ht 5\' 6"  (1.676 m)  Wt 219 lb (99.338 kg)  BMI 35.36 kg/m2 General: NAD, obese.  Neck: No JVD, no thyromegaly or thyroid nodule.  Lungs: Clear to auscultation bilaterally with normal respiratory  effort. CV: Nondisplaced PMI.  Heart irregular S1/S2, no S3/S4, no murmur.  Bilateral varicosities with trace ankle edema.  No carotid bruit.  Normal pedal pulses.  Abdomen: Soft, nontender, no hepatosplenomegaly, no distention.  Neurologic: Alert and oriented x 3.  Psych: Normal affect. Extremities: No clubbing or cyanosis.   Assessment/Plan: 1. Atrial fibrillation: Persistent.  She failed dronedarone and is now on amiodarone.  She is not a good candidate for class Ic agents given CAD.   We had talked about giving her a chance to try to regain NSR with amiodarone last fall, but she did not followup due to shoulder surgery and then post-op complications.  She remains in chronic atrial fibrillation and on amiodarone.  - Baseline PFTs on amiodarone done and normal.  LFTs normal recently, TSH mildly elevated.  Repeat TSH and check free T3 and T4.  Will need regular eye exams if she stays on amiodarone.    - Continue warfarin (has preferred this to NOAC).   - 06/04/15 INR was therapeutic.  Will do weekly INRs  for 3 more weeks, and if they remain therapeutic, will arrange for DCCV.  If she fails DCCV, stop amiodarone and plan rate control/anticoagulation.  - Continue current diltiazem CD.  2. CAD: She is on warfarin so not on ASA.  No chest pain. Needs to restart statin.   3. Hyperlipidemia: She stopped Livalo recently and LDL is high.  Goal LDL < 70 given known CAD from coronary CTA in past. She did not have side effects from Livalo, so will restart at 2 mg daily and check lipids/LFTs in 2 months.  4. Mitral stenosis: Reported on echo from outside lab.  We reassessed this by echo here in 8/15, suspect no more than mild mitral stenosis.  5.. Chronic diastolic CHF: Triggered by atrial fibrillation in the past.  Currently seems to be doing well on Lasix 20 mg daily.    Loralie Champagne 06/06/2015

## 2015-06-06 NOTE — Addendum Note (Signed)
Addended by: Elio Forget on: 06/06/2015 01:26 PM   Modules accepted: Miquel Dunn

## 2015-06-10 NOTE — Addendum Note (Signed)
Addended by: Elio Forget on: 06/10/2015 03:12 PM   Modules accepted: Orders, SmartSet

## 2015-06-11 ENCOUNTER — Ambulatory Visit (INDEPENDENT_AMBULATORY_CARE_PROVIDER_SITE_OTHER): Payer: Medicare Other | Admitting: Pharmacist

## 2015-06-11 DIAGNOSIS — I480 Paroxysmal atrial fibrillation: Secondary | ICD-10-CM

## 2015-06-11 DIAGNOSIS — Z7901 Long term (current) use of anticoagulants: Secondary | ICD-10-CM

## 2015-06-11 DIAGNOSIS — Z5181 Encounter for therapeutic drug level monitoring: Secondary | ICD-10-CM

## 2015-06-11 DIAGNOSIS — I824Z9 Acute embolism and thrombosis of unspecified deep veins of unspecified distal lower extremity: Secondary | ICD-10-CM

## 2015-06-11 LAB — POCT INR: INR: 2.4

## 2015-06-18 ENCOUNTER — Ambulatory Visit (INDEPENDENT_AMBULATORY_CARE_PROVIDER_SITE_OTHER): Payer: Medicare Other

## 2015-06-18 DIAGNOSIS — I480 Paroxysmal atrial fibrillation: Secondary | ICD-10-CM | POA: Diagnosis not present

## 2015-06-18 DIAGNOSIS — Z7901 Long term (current) use of anticoagulants: Secondary | ICD-10-CM

## 2015-06-18 DIAGNOSIS — Z5181 Encounter for therapeutic drug level monitoring: Secondary | ICD-10-CM | POA: Diagnosis not present

## 2015-06-18 DIAGNOSIS — I824Z9 Acute embolism and thrombosis of unspecified deep veins of unspecified distal lower extremity: Secondary | ICD-10-CM | POA: Diagnosis not present

## 2015-06-18 LAB — POCT INR: INR: 2.1

## 2015-06-22 ENCOUNTER — Other Ambulatory Visit: Payer: Self-pay | Admitting: Cardiology

## 2015-07-02 ENCOUNTER — Telehealth: Payer: Self-pay | Admitting: *Deleted

## 2015-07-02 ENCOUNTER — Ambulatory Visit (INDEPENDENT_AMBULATORY_CARE_PROVIDER_SITE_OTHER): Payer: Medicare Other | Admitting: Pharmacist

## 2015-07-02 ENCOUNTER — Encounter: Payer: Self-pay | Admitting: *Deleted

## 2015-07-02 DIAGNOSIS — I824Z9 Acute embolism and thrombosis of unspecified deep veins of unspecified distal lower extremity: Secondary | ICD-10-CM | POA: Diagnosis not present

## 2015-07-02 DIAGNOSIS — Z5181 Encounter for therapeutic drug level monitoring: Secondary | ICD-10-CM

## 2015-07-02 DIAGNOSIS — I480 Paroxysmal atrial fibrillation: Secondary | ICD-10-CM

## 2015-07-02 DIAGNOSIS — Z7901 Long term (current) use of anticoagulants: Secondary | ICD-10-CM | POA: Diagnosis not present

## 2015-07-02 LAB — POCT INR: INR: 3.6

## 2015-07-02 NOTE — Telephone Encounter (Signed)
Pt scheduled for cardioversion 07/11/15 1PM, Dr Aundra Dubin. I reviewed instructions with patient and mailed a copy to her.

## 2015-07-02 NOTE — Telephone Encounter (Signed)
Follow up   Pt is calling to speak to rn he states he is returning call

## 2015-07-02 NOTE — Telephone Encounter (Signed)
-----   Message from Leeroy Bock, [facility] sent at 07/02/2015  2:04 PM EDT ----- Regarding: Cardioversion Ms. Khanam was seen in Coumadin clinic today - she has had 4 therapeutic weekly INR checks and is ready for her cardioversion to be scheduled. I tentatively scheduled a Coumadin check for next Tuesday 4/18, feel free to move this to whatever date you schedule her cardioversion for.  Thanks, [employer]

## 2015-07-02 NOTE — Telephone Encounter (Signed)
07/02/15 LMTCB for pt to schedule cardioversion.

## 2015-07-11 ENCOUNTER — Ambulatory Visit (INDEPENDENT_AMBULATORY_CARE_PROVIDER_SITE_OTHER): Payer: Medicare Other | Admitting: *Deleted

## 2015-07-11 ENCOUNTER — Encounter (HOSPITAL_COMMUNITY)
Admission: RE | Disposition: A | Discharge status: Home / Self Care | Payer: Self-pay | Source: Ambulatory Visit | Attending: Cardiology

## 2015-07-11 ENCOUNTER — Ambulatory Visit (HOSPITAL_COMMUNITY): Payer: Medicare Other | Admitting: Anesthesiology

## 2015-07-11 ENCOUNTER — Ambulatory Visit (HOSPITAL_COMMUNITY)
Admission: RE | Admit: 2015-07-11 | Discharge: 2015-07-11 | Disposition: A | Discharge status: Home / Self Care | Payer: Medicare Other | Source: Ambulatory Visit | Attending: Cardiology | Admitting: Cardiology

## 2015-07-11 ENCOUNTER — Encounter (HOSPITAL_COMMUNITY): Payer: Self-pay | Admitting: *Deleted

## 2015-07-11 DIAGNOSIS — I824Z9 Acute embolism and thrombosis of unspecified deep veins of unspecified distal lower extremity: Secondary | ICD-10-CM

## 2015-07-11 DIAGNOSIS — I10 Essential (primary) hypertension: Secondary | ICD-10-CM | POA: Diagnosis not present

## 2015-07-11 DIAGNOSIS — Z87891 Personal history of nicotine dependence: Secondary | ICD-10-CM | POA: Insufficient documentation

## 2015-07-11 DIAGNOSIS — Z7901 Long term (current) use of anticoagulants: Secondary | ICD-10-CM

## 2015-07-11 DIAGNOSIS — I4891 Unspecified atrial fibrillation: Secondary | ICD-10-CM | POA: Insufficient documentation

## 2015-07-11 DIAGNOSIS — I480 Paroxysmal atrial fibrillation: Secondary | ICD-10-CM | POA: Diagnosis not present

## 2015-07-11 DIAGNOSIS — Z5181 Encounter for therapeutic drug level monitoring: Secondary | ICD-10-CM

## 2015-07-11 DIAGNOSIS — F418 Other specified anxiety disorders: Secondary | ICD-10-CM | POA: Insufficient documentation

## 2015-07-11 DIAGNOSIS — I2510 Atherosclerotic heart disease of native coronary artery without angina pectoris: Secondary | ICD-10-CM | POA: Diagnosis not present

## 2015-07-11 HISTORY — PX: CARDIOVERSION: SHX1299

## 2015-07-11 HISTORY — DX: Personal history of other diseases of the digestive system: Z87.19

## 2015-07-11 LAB — POCT INR: INR: 2.9

## 2015-07-11 SURGERY — CARDIOVERSION
Anesthesia: General

## 2015-07-11 MED ORDER — PROPOFOL 10 MG/ML IV BOLUS
INTRAVENOUS | Status: DC | PRN
  Administered 2015-07-11: 70 mg via INTRAVENOUS

## 2015-07-11 MED ORDER — LIDOCAINE HCL (CARDIAC) 20 MG/ML IV SOLN
INTRAVENOUS | Status: DC | PRN
  Administered 2015-07-11: 40 mg via INTRAVENOUS

## 2015-07-11 MED ORDER — SODIUM CHLORIDE 0.9 % IV SOLN
INTRAVENOUS | Status: DC
  Administered 2015-07-11: 13:00:00 via INTRAVENOUS

## 2015-07-11 MED ORDER — MEPERIDINE HCL 100 MG/ML IJ SOLN: 6.2500 mg | INTRAMUSCULAR | Status: DC | PRN

## 2015-07-11 NOTE — H&P (Signed)
  Please see office note from 3/14, no changes to patient history/physical.  INR 2.9, will proceed with DCCV.   Loralie Champagne 07/11/2015 12:48 PM

## 2015-07-11 NOTE — Transfer of Care (Signed)
Immediate Anesthesia Transfer of Care Note  Patient: [name]  Procedure(s) Performed: Procedure(s): CARDIOVERSION (N/A)  Patient Location: Endoscopy Unit  Anesthesia Type:General  Level of Consciousness: sedated and patient cooperative  Airway & Oxygen Therapy: Patient Spontanous Breathing and Patient connected to nasal cannula oxygen  Post-op Assessment: Report given to RN and Post -op Vital signs reviewed and stable  Post vital signs: Reviewed and stable  Last Vitals:  Filed Vitals:   07/11/15 1254 07/11/15 1255  BP: 132/113   Pulse:    Resp: 18 20    Complications: No apparent anesthesia complications

## 2015-07-11 NOTE — Discharge Instructions (Signed)
Electrical Cardioversion, Care After °Refer to this sheet in the next few weeks. These instructions provide you with information on caring for yourself after your procedure. Your health care provider may also give you more specific instructions. Your treatment has been planned according to current medical practices, but problems sometimes occur. Call your health care provider if you have any problems or questions after your procedure. °WHAT TO EXPECT AFTER THE PROCEDURE °After your procedure, it is typical to have the following sensations: °· Some redness on the skin where the shocks were delivered. If this is tender, a sunburn lotion or hydrocortisone cream may help. °· Possible return of an abnormal heart rhythm within hours or days after the procedure. °HOME CARE INSTRUCTIONS °· Take medicines only as directed by your health care provider. Be sure you understand how and when to take your medicine. °· Learn how to feel your pulse and check it often. °· Limit your activity for 48 hours after the procedure or as directed by your health care provider. °· Avoid or minimize caffeine and other stimulants as directed by your health care provider. °SEEK MEDICAL CARE IF: °· You feel like your heart is beating too fast or your pulse is not regular. °· You have any questions about your medicines. °· You have bleeding that will not stop. °SEEK IMMEDIATE MEDICAL CARE IF: °· You are dizzy or feel faint. °· It is hard to breathe or you feel short of breath. °· There is a change in discomfort in your chest. °· Your speech is slurred or you have trouble moving an arm or leg on one side of your body. °· You get a serious muscle cramp that does not go away. °· Your fingers or toes turn cold or blue. °  °This information is not intended to replace advice given to you by your health care provider. Make sure you discuss any questions you have with your health care provider. °  °Document Released: 12/28/2012 Document Revised: 03/30/2014  Document Reviewed: 12/28/2012 °Elsevier Interactive Patient Education ©2016 Elsevier Inc[employer] ° ° °Monitored Anesthesia Care °Monitored anesthesia care is an anesthesia service for a medical procedure. Anesthesia is the loss of the ability to feel pain. It is produced by medicines called anesthetics. It may affect a small area of your body (local anesthesia), a large area of your body (regional anesthesia), or your entire body (general anesthesia). The need for monitored anesthesia care depends your procedure, your condition, and the potential need for regional or general anesthesia. It is often provided during procedures where:  °· General anesthesia may be needed if there are complications. This is because you need special care when you are under general anesthesia.   °· You will be under local or regional anesthesia. This is so that you are able to have higher levels of anesthesia if needed.   °· You will receive calming medicines (sedatives). This is especially the case if sedatives are given to put you in a semi-conscious state of relaxation (deep sedation). This is because the amount of sedative needed to produce this state can be hard to predict. Too much of a sedative can produce general anesthesia. °Monitored anesthesia care is performed by one or more health care providers who have special training in all types of anesthesia. You will need to meet with these health care providers before your procedure. During this meeting, they will ask you about your medical history. They will also give you instructions to follow. (For example, you will need to stop eating   and drinking before your procedure. You may also need to stop or change medicines you are taking.) During your procedure, your health care providers will stay with you. They will:   Watch your condition. This includes watching your blood pressure, breathing, and level of pain.   Diagnose and treat problems that occur.   Give medicines if they are  needed. These may include calming medicines (sedatives) and anesthetics.   Make sure you are comfortable.  Having monitored anesthesia care does not necessarily mean that you will be under anesthesia. It does mean that your health care providers will be able to manage anesthesia if you need it or if it occurs. It also means that you will be able to have a different type of anesthesia than you are having if you need it. When your procedure is complete, your health care providers will continue to watch your condition. They will make sure any medicines wear off before you are allowed to go home.    This information is not intended to replace advice given to you by your health care provider. Make sure you discuss any questions you have with your health care provider.   Document Released: 12/03/2004 Document Revised: 03/30/2014 Document Reviewed: 04/20/2012 Elsevier Interactive Patient Education 2016 Elsevier Inc.   Atrial Fibrillation Atrial fibrillation is a type of heartbeat that is irregular or fast (rapid). If you have this condition, your heart keeps quivering in a weird (chaotic) way. This condition can make it so your heart cannot pump blood normally. Having this condition gives a person more risk for stroke, heart failure, and other heart problems. There are different types of atrial fibrillation. Talk with your doctor to learn about the type that you have. HOME CARE  Take over-the-counter and prescription medicines only as told by your doctor.  If your doctor prescribed a blood-thinning medicine, take it exactly as told. Taking too much of it can cause bleeding. If you do not take enough of it, you will not have the protection that you need against stroke and other problems.  Do not use any tobacco products. These include cigarettes, chewing tobacco, and e-cigarettes. If you need help quitting, ask your doctor.  If you have apnea (obstructive sleep apnea), manage it as told by your  doctor.  Do not drink alcohol.  Do not drink beverages that have caffeine. These include coffee, soda, and tea.  Maintain a healthy weight. Do not use diet pills unless your doctor says they are safe for you. Diet pills may make heart problems worse.  Follow diet instructions as told by your doctor.  Exercise regularly as told by your doctor.  Keep all follow-up visits as told by your doctor. This is important. GET HELP IF:  You notice a change in the speed, rhythm, or strength of your heartbeat.  You are taking a blood-thinning medicine and you notice more bruising.  You get tired more easily when you move or exercise. GET HELP RIGHT AWAY IF:  You have pain in your chest or your belly (abdomen).  You have sweating or weakness.  You feel sick to your stomach (nauseous).  You notice blood in your throw up (vomit), poop (stool), or pee (urine).  You are short of breath.  You suddenly have swollen feet and ankles.  You feel dizzy.  Your suddenly get weak or numb in your face, arms, or legs, especially if it happens on one side of your body.  You have trouble talking, trouble understanding, or both.  Your face or your eyelid droops on one side. These symptoms may be an emergency. Do not wait to see if the symptoms will go away. Get medical help right away. Call your local emergency services (911 in the [place] Do not drive yourself to the hospital.   This information is not intended to replace advice given to you by your health care provider. Make sure you discuss any questions you have with your health care provider.   Document Released: 12/17/2007 Document Revised: 11/28/2014 Document Reviewed: 07/04/2014 Elsevier Interactive Patient Education [employer].

## 2015-07-11 NOTE — Procedures (Signed)
Electrical Cardioversion Procedure Note Floriene Cress [id number] [birth year]  Procedure: Electrical Cardioversion Indications:  Atrial Fibrillation.  INR has been therapeutic for > 1 month.   Procedure Details Consent: Risks of procedure as well as the alternatives and risks of each were explained to the (patient/caregiver).  Consent for procedure obtained. Time Out: Verified patient identification, verified procedure, site/side was marked, verified correct patient position, special equipment/implants available, medications/allergies/relevent history reviewed, required imaging and test results available.  Performed  Patient placed on cardiac monitor, pulse oximetry, supplemental oxygen as necessary.  Sedation given: Propofol per anesthesiology Pacer pads placed anterior and posterior chest.  Cardioverted 1 time(s).  Cardioverted at [place].  Evaluation Findings: Post procedure EKG shows: NSR Complications: None Patient did tolerate procedure well.     Loralie Champagne 07/11/2015, 12:57 PM

## 2015-07-11 NOTE — Anesthesia Preprocedure Evaluation (Addendum)
Anesthesia Evaluation  Patient identified by MRN, date of birth, ID band Patient awake    Airway Mallampati: II  TM Distance: >3 FB Neck ROM: Full    Dental  (+) Teeth Intact, Dental Advisory Given   Pulmonary Shortness of breath: with stairs., former smoker (quit 2010 100 pack years, PFT FVC 2),    breath sounds clear to auscultation       Cardiovascular hypertension, Pt. on medications + CAD  + dysrhythmias Atrial Fibrillation  Rhythm:Irregular Rate:Normal  ECHO 2015 EF 55%, no wall motion abnormality, LVH   Neuro/Psych Anxiety Depression    GI/Hepatic GERD  Medicated,  Endo/Other    Renal/GU      Musculoskeletal   Abdominal (+) + obese,   Peds  Hematology   Anesthesia Other Findings   Reproductive/Obstetrics                           Anesthesia Physical Anesthesia Plan  ASA: III  Anesthesia Plan: MAC   Post-op Pain Management:    Induction: Intravenous  Airway Management Planned: Nasal Cannula and Simple Face Mask  Additional Equipment:   Intra-op Plan:   Post-operative Plan:   Informed Consent: I have reviewed the patients History and Physical, chart, labs and discussed the procedure including the risks, benefits and alternatives for the proposed anesthesia with the patient or authorized representative who has indicated his/her understanding and acceptance.     Plan Discussed with:   Anesthesia Plan Comments:         Anesthesia Quick Evaluation

## 2015-07-11 NOTE — Anesthesia Postprocedure Evaluation (Signed)
Anesthesia Post Note  Patient: [name]  Procedure(s) Performed: Procedure(s) (LRB): CARDIOVERSION (N/A)  Patient location during evaluation: Endoscopy Anesthesia Type: MAC Level of consciousness: awake and alert Pain management: pain level controlled Vital Signs Assessment: post-procedure vital signs reviewed and stable Respiratory status: spontaneous breathing, nonlabored ventilation, respiratory function stable and patient connected to nasal cannula oxygen Cardiovascular status: blood pressure returned to baseline and stable Postop Assessment: no signs of nausea or vomiting Anesthetic complications: no    Last Vitals:  Filed Vitals:   07/11/15 1254 07/11/15 1255  BP: 132/113   Pulse:    Resp: 18 20    Last Pain: There were no vitals filed for this visit.               Alexis Frock

## 2015-07-11 NOTE — Anesthesia Procedure Notes (Signed)
Procedure Name: MAC Date/Time: 07/11/2015 12:39 PM Performed by: Layla Maw Pre-anesthesia Checklist: Patient identified, Patient being monitored, Timeout performed, Emergency Drugs available and Suction available Patient Re-evaluated:Patient Re-evaluated prior to inductionOxygen Delivery Method: Nasal cannula and Ambu bag Preoxygenation: Pre-oxygenation with 100% oxygen Number of attempts: 1 Placement Confirmation: positive ETCO2 Dental Injury: Teeth and Oropharynx as per pre-operative assessment

## 2015-07-12 ENCOUNTER — Encounter (HOSPITAL_COMMUNITY): Payer: Self-pay | Admitting: Cardiology

## 2015-07-18 ENCOUNTER — Ambulatory Visit (INDEPENDENT_AMBULATORY_CARE_PROVIDER_SITE_OTHER): Payer: Medicare Other | Admitting: *Deleted

## 2015-07-18 DIAGNOSIS — I480 Paroxysmal atrial fibrillation: Secondary | ICD-10-CM

## 2015-07-18 DIAGNOSIS — I824Z9 Acute embolism and thrombosis of unspecified deep veins of unspecified distal lower extremity: Secondary | ICD-10-CM | POA: Diagnosis not present

## 2015-07-18 DIAGNOSIS — Z7901 Long term (current) use of anticoagulants: Secondary | ICD-10-CM | POA: Diagnosis not present

## 2015-07-18 DIAGNOSIS — Z5181 Encounter for therapeutic drug level monitoring: Secondary | ICD-10-CM

## 2015-07-18 LAB — POCT INR: INR: 2.7

## 2015-07-24 ENCOUNTER — Encounter: Payer: Self-pay | Admitting: Cardiology

## 2015-07-24 ENCOUNTER — Ambulatory Visit (INDEPENDENT_AMBULATORY_CARE_PROVIDER_SITE_OTHER): Payer: Medicare Other | Admitting: Cardiology

## 2015-07-24 VITALS — BP 122/80 | HR 74 | Ht 66.0 in | Wt 223.0 lb

## 2015-07-24 DIAGNOSIS — I481 Persistent atrial fibrillation: Secondary | ICD-10-CM

## 2015-07-24 DIAGNOSIS — I2510 Atherosclerotic heart disease of native coronary artery without angina pectoris: Secondary | ICD-10-CM | POA: Diagnosis not present

## 2015-07-24 DIAGNOSIS — I4819 Other persistent atrial fibrillation: Secondary | ICD-10-CM

## 2015-07-24 MED ORDER — METOPROLOL SUCCINATE ER 25 MG PO TB24
25.0000 mg | ORAL_TABLET | Freq: Every day | ORAL | Status: DC
Start: 2015-07-24 — End: 2016-04-25

## 2015-07-24 NOTE — Patient Instructions (Signed)
Medication Instructions:  Stop amiodarone.  Start Toprol XL (metoprolol succinate) 25mg  daily.  Labwork: Your physician recommends that you return for a FASTING lipid profile/TSH/Free T4/Free T3 May 15,2017.   Testing/Procedures: None   Follow-Up: Your physician wants you to follow-up in: 6 months with Dr Aundra Dubin. (November 2017). You will receive a reminder letter in the mail two months in advance. If you don't receive a letter, please call our office to schedule the follow-up appointment.        If you need a refill on your cardiac medications before your next appointment, please call your pharmacy.

## 2015-07-25 NOTE — Progress Notes (Signed)
Patient ID: [name], female   DOB: 12-26-1945, 70 y.o.   MRN: [id number] PCP: Dr. Elease Hashimoto  70 yo with history of retroperitoneal fibrosis and non-Hodgkin's lymphoma as well as prior DVT presented initially for evaluation of an abnormal myoview.  A Lexiscan myoview was done in 1/11. This was an equivocal study due to subdiaphragmatic nuclear uptake. EF was calculated to be 49% with global hypokinesis. Given the equivocal myoview, I had her do a coronary CT angiogram. This showed a moderate calcium score of 161 (putting her at a high risk percentile for her age) and a possible moderate mid LAD stenosis (though some degradation of images by respiratory artifact). Echo was done to reassess LV systolic function (reported as mildly decreased on myoview). This confirmed mild LV systolic dysfunction, EF [id number] with moderate diastolic dysfunction. We discussed catheterization, but patient strongly preferred medical treatment.   She was diagnosed with paroxysmal atrial fibrillation.  She was started on warfarin and diltiazem CD.  Last echo in 8/15 showed EF 50-55%, moderate LV hypertrophy, probably mild mitral stenosis, normal RV size and systolic function.    She was initially on dronedarone to try to maintain NSR, but developed persistent atrial fibrillation.  I changed her antiarrhythmic to amiodarone.  We did a cardioversion to NSR in 4/17, but she is back in atrial fibrillation today despite amiodarone use.  She does not notice the atrial fibrillation.  She is not very active but does not get short of breath walking short distances.  Dyspnea with stairs.  No chest pain.     Labs (12/10): HCT 34.5, K 4.3, creatinine 1.2, LDL 101, HDL 46  Labs (7/11): LFTs normal, LDL 69, HDL 47, BNP 21, K 4.3, creatinine 1.2  Labs (1/12): K 5.2, creatinine 1.0, LFTs normal, LDL 59, HDL 46  Labs (6/13): LDL 54, HDL 52, K 4.7, creatinine 1.1 Labs (9/14): LDL 79, HDL 56 Labs (4/15): TSH normal Labs (11/15): K 5.2,  creatinine 1.2, LDL 69, HDL 63 Labs (3/16): BNP 168, K 4, creatinine 1.08 Labs (4/16): K 4.1, creatinine 1.12 Labs (7/16): HCT 36.9, LFTs normal, TSH normal Labs (3/17): K 5.1, creatinine 1.18, LDL 130, HDL 56, HT 36.4, LFTs normal, TSH mildly elevated  ECG: atrial fibrillation at 72  Past Medical History:   1. Anxiety Disorder  2. Arthritis  3. Depression  4. Retroperitoneal fibrosis with non-Hodgkin's lymphoma. This was treated with chemotherapy in 2006 and is in remission.  5. Adenomatous Colon Polyps  6. Diverticulosis  7. DVT left leg/thigh: in both 2005 and 2006, associated with lymphoma. She is on chronic coumadin.  8. Obesity  9. Left ureteral stent (ureter involved by retroperitoneal fibrosis).  10. CAD: Lexiscan myoview (1/11): EF 49% with global hypokinesis. There appeared to be a mild anterior perfusion defect that could represent ischemia. However, there was significant subdiaphragmatic nuclear activity that was worse in stress than rest so it is possible that the defect was artifactual. This study was equivocal. Coronary CT angiogram was done to follow this up in 2/11. This showed calcium score of 161 (86th percentile for her age and race) and a possible moderate mid LAD stenosis.  11. Mild systolic dysfunction: EF [id number] on myoview. Echo (2/11): EF 45-50%, moderate diastolic dysfunction, mild MR, mild biatrial enlargement, PASP 34 mmHg. It is possible that this could be chemotherapy-related.  Echo (11/14): EF 50-55%, grade II diastolic dysfunction, ?moderate mitral stenosis, no MR. Echo (8/15): EF 50-55%, moderate LVH, probably mild mitral stenosis with mean gradient 2,  normal PA pressure, normal RV.  12. Hyperlipidemia: myalgias with Crestor.  13. Atrial fibrillation: Persistent.  - DCCV to NSR on amiodarone in 4/17, but back in atrial fibrillation by 5/17.  14. Cough with ACEI 15. PFTs (7/16): Near normal with minimal obstructive airways disease noted.  16. Right shoulder  replacement 2016.   Family History:  Family History of Arthritis mother  Family History Diabetes 1st degree relative deceased sister  Family History Hypertension deceased sister  Family History of Stroke F 1st degree relative <60 deceased sister  Family History of Cardiovascular disorder mother  Sister had MI and CVA at 28  Mother with CABG in her 75s   Social History:  Retired  Married  Quit smoking 5/10  Alcohol use-yes, rarely  Regular exercise-no   Review of Systems  All systems reviewed and negative except as per HPI.   Current Outpatient Prescriptions  Medication Sig Dispense Refill  . ALPRAZolam (XANAX) 1 MG tablet Take 1 tablet (1 mg total) by mouth 3 (three) times daily as needed for anxiety. 90 tablet 5  . b complex vitamins capsule Take 2 capsules by mouth daily.     Marland Kitchen buPROPion (WELLBUTRIN XL) 300 MG 24 hr tablet Take 1 tablet (300 mg total) by mouth daily. 90 tablet 1  . Cholecalciferol (VITAMIN D) 2000 UNITS CAPS Take 2,000 Int'l Units by mouth.    . Coenzyme Q10-Fish Oil-Vit E (CO-Q 10 OMEGA-3 FISH OIL PO) Take 100 mg elemental calcium/kg/hr by mouth daily.    Marland Kitchen diltiazem (CARDIZEM CD) 180 MG 24 hr capsule take 1 capsule by mouth twice a day 60 capsule 5  . DULoxetine (CYMBALTA) 60 MG capsule Take 1 capsule (60 mg total) by mouth daily. 30 capsule 2  . fluticasone (FLONASE) 50 MCG/ACT nasal spray instill 2 sprays into each nostril once daily (Patient taking differently: instill 2 sprays into each nostril once daily as needed) 16 g 6  . furosemide (LASIX) 20 MG tablet Take 1 tablet (20 mg total) by mouth daily. 90 tablet 2  . loratadine (CLARITIN) 10 MG tablet Take 10 mg by mouth daily.     Marland Kitchen Lysine 500 MG TABS Take 500 mg by mouth daily.    Marland Kitchen NITROSTAT 0.4 MG SL tablet place 1 tablet under the tongue every 5 minutes for UP TO 3 doses if needed 25 tablet 12  . oxyCODONE-acetaminophen (PERCOCET) 5-325 MG tablet Take 1-2 tablets by mouth every 4 (four) hours as needed.  30 tablet 0  . Pitavastatin Calcium (LIVALO) 2 MG TABS take 1 tablet by mouth once daily 30 tablet 12  . warfarin (COUMADIN) 5 MG tablet Take 5 mg daily except 7.5 mg on MWF or as directed (Patient taking differently: Take 5 mg by mouth. Take 5 mg daily except 2.5 on Sunday and Tuesdays) 90 tablet 3  . [DISCONTINUED] amiodarone (PACERONE) 200 MG tablet Take 1 tablet (200 mg total) by mouth daily. 30 tablet 5  . metoprolol succinate (TOPROL XL) 25 MG 24 hr tablet Take 1 tablet (25 mg total) by mouth daily. 90 tablet 2   No current facility-administered medications for this visit.   BP 122/80 mmHg  Pulse 74  Ht 5\' 6"  (1.676 m)  Wt 223 lb (101.152 kg)  BMI 36.01 kg/m2 General: NAD, obese.  Neck: No JVD, no thyromegaly or thyroid nodule.  Lungs: Clear to auscultation bilaterally with normal respiratory effort. CV: Nondisplaced PMI.  Heart irregular S1/S2, no S3/S4, no murmur.  Bilateral varicosities with trace  ankle edema.  No carotid bruit.  Normal pedal pulses.  Abdomen: Soft, nontender, no hepatosplenomegaly, no distention.  Neurologic: Alert and oriented x 3.  Psych: Normal affect. Extremities: No clubbing or cyanosis.   Assessment/Plan: 1. Atrial fibrillation: Persistent.  She is not a good candidate for class Ic agents given CAD.   She failed dronedarone and amiodarone, back in atrial fibrillation today despite DCCV in 4/17.  She is minimally symptomatic.  I think that at this point, our best choice will be rate control/anticoagulation.   - Stop amiodarone today, start Toprol XL 25 mg daily.  - Continue warfarin (has preferred this to NOAC).   - TSH was abnormal in past while on amiodarone, repeat TSH, free T3, free T4.  - Continue current diltiazem CD.  2. CAD: She is on warfarin so not on ASA.  No chest pain. Check lipids/LFTs on Livalo.   3. Hyperlipidemia: No problems with Livalo, check lipids/LFTs.  4. Mitral stenosis: Reported on echo from outside lab.  We reassessed this by  echo here in 8/15, suspect no more than mild mitral stenosis.  5. Chronic diastolic CHF: Triggered by atrial fibrillation in the past.  Currently seems to be doing well on Lasix 20 mg daily.    Followup in 6 months.   Loralie Champagne 07/25/2015

## 2015-08-01 ENCOUNTER — Telehealth: Payer: Self-pay | Admitting: Cardiology

## 2015-08-01 NOTE — Telephone Encounter (Signed)
No answer, voice mail 

## 2015-08-01 NOTE — Telephone Encounter (Signed)
LMTCB for pt 

## 2015-08-01 NOTE — Telephone Encounter (Signed)
New Message  Started a new medication that could have made her drowsy. He is   Pt c/o medication issue:  1. Name of Medication: metoprolol succinate (TOPROL XL) 25 MG 24 hr tablet  4. What is your medication issue? Per pt son the pt is Abnormally tired. She is sleep more than normal and they are worried that the medication maybe too much for her.

## 2015-08-05 ENCOUNTER — Other Ambulatory Visit: Payer: Self-pay | Admitting: Family Medicine

## 2015-08-05 ENCOUNTER — Other Ambulatory Visit: Payer: Medicare Other

## 2015-08-05 NOTE — Telephone Encounter (Signed)
Pt states she started feeling sleepy last Thursday. Pt states she began having URI symptoms of cough/ congestion about the same time, her sleepiness may be related to this. Pt states she does not know her heart rate or BP.  Pt states she will continue Toprol for now and see if sleepiness improves once she is over URI illness.  Pt will call back if symptoms do not improve.

## 2015-08-09 ENCOUNTER — Other Ambulatory Visit: Payer: Self-pay | Admitting: Cardiology

## 2015-08-20 ENCOUNTER — Telehealth: Payer: Self-pay

## 2015-08-20 ENCOUNTER — Other Ambulatory Visit: Payer: Self-pay | Admitting: *Deleted

## 2015-08-20 MED ORDER — LOSARTAN POTASSIUM 25 MG PO TABS: 25.0000 mg | ORAL_TABLET | Freq: Every day | ORAL | Status: DC

## 2015-08-20 MED ORDER — DULOXETINE HCL 60 MG PO CPEP: 60.0000 mg | ORAL_CAPSULE | Freq: Every day | ORAL | Status: DC

## 2015-08-20 NOTE — Telephone Encounter (Signed)
Patient called due to pharmacy telling her that she is no longer suppose to be taking losartan 25 mg. Please call her back to clarify when this change happened. She will be out today and would liked to to be contacted at [phone].

## 2015-08-20 NOTE — Telephone Encounter (Signed)
Rx done. 

## 2015-08-20 NOTE — Telephone Encounter (Signed)
BP and creatinine ok, can continue

## 2015-08-20 NOTE — Telephone Encounter (Signed)
Copied from discharge summary 02/04/15--  -Hold losartan--do not plan to restart at time of d/c-->Pt remained normotensive--will need outpt follow to determine need to restart  08/20/15--pt states she is currently taking losartan 25mg  daily, she never stopped taking it.   Pt advised I will forward to Dr Aundra Dubin for recommendation about continuing losartan.

## 2015-08-20 NOTE — Telephone Encounter (Signed)
Pt advised Dr Aundra Dubin said okay to continue losartan.

## 2015-09-10 DIAGNOSIS — H43811 Vitreous degeneration, right eye: Secondary | ICD-10-CM | POA: Diagnosis not present

## 2015-09-13 ENCOUNTER — Other Ambulatory Visit (INDEPENDENT_AMBULATORY_CARE_PROVIDER_SITE_OTHER): Payer: Medicare Other | Admitting: *Deleted

## 2015-09-13 DIAGNOSIS — E785 Hyperlipidemia, unspecified: Secondary | ICD-10-CM | POA: Diagnosis not present

## 2015-09-13 DIAGNOSIS — I481 Persistent atrial fibrillation: Secondary | ICD-10-CM | POA: Diagnosis not present

## 2015-09-13 DIAGNOSIS — I4819 Other persistent atrial fibrillation: Secondary | ICD-10-CM

## 2015-09-13 LAB — LIPID PANEL
Cholesterol: 129 mg/dL (ref 125–200)
HDL: 58 mg/dL (ref >=46)
LDL Cholesterol: 54 mg/dL (ref <130)
Total CHOL/HDL Ratio: 2.2 Ratio (ref <=5.0)
Triglycerides: 83 mg/dL (ref <150)
VLDL: 17 mg/dL (ref <30)

## 2015-09-13 LAB — T4, FREE: FREE T4: 1.1 ng/dL (ref 0.8–1.8)

## 2015-09-13 LAB — PROTIME-INR
INR: 3.1 — ABNORMAL HIGH
Prothrombin Time: 31.1 s — ABNORMAL HIGH (ref 9.0–11.5)

## 2015-09-13 LAB — HEPATIC FUNCTION PANEL
ALBUMIN: 3.7 g/dL (ref 3.6–5.1)
ALK PHOS: 77 U/L (ref 33–130)
ALT: 13 U/L (ref 6–29)
AST: 15 U/L (ref 10–35)
BILIRUBIN INDIRECT: 0.2 mg/dL (ref 0.2–1.2)
BILIRUBIN TOTAL: 0.3 mg/dL (ref 0.2–1.2)
Bilirubin, Direct: 0.1 mg/dL (ref <=0.2)
Total Protein: 6.2 g/dL (ref 6.1–8.1)

## 2015-09-13 LAB — TSH: TSH: 6.65 m[IU]/L — AB

## 2015-09-13 LAB — T3, FREE: T3 FREE: 2.8 pg/mL (ref 2.3–4.2)

## 2015-09-13 NOTE — Addendum Note (Signed)
Addended by: Eulis Foster on: 09/13/2015 10:19 AM   Modules accepted: Orders

## 2015-09-16 ENCOUNTER — Telehealth: Payer: Self-pay | Admitting: Cardiology

## 2015-09-16 NOTE — Telephone Encounter (Signed)
NEw Message  Pt returning RN call about Lab results. Please call back to discuss

## 2015-09-16 NOTE — Telephone Encounter (Signed)
Informed pt of lab results. Pt verbalized understanding. 

## 2015-09-18 ENCOUNTER — Other Ambulatory Visit: Payer: Self-pay | Admitting: Cardiology

## 2015-10-02 ENCOUNTER — Other Ambulatory Visit: Payer: Self-pay | Admitting: Cardiology

## 2015-10-03 ENCOUNTER — Other Ambulatory Visit: Payer: Self-pay | Admitting: Cardiology

## 2015-10-14 ENCOUNTER — Ambulatory Visit: Payer: Medicare Other | Admitting: Family Medicine

## 2015-11-04 ENCOUNTER — Telehealth: Payer: Self-pay | Admitting: Family Medicine

## 2015-11-04 NOTE — Telephone Encounter (Signed)
° °  Pt call to say she has been summoned for jury duty. She call today to ask if Dr Elease Hashimoto will write a letter stating why she can't do jury duty. She said she has to go to the bathroom a lot. Pt said she has to report on 11/07/15   [phone]

## 2015-11-04 NOTE — Telephone Encounter (Signed)
Juror number [id number] Pt would like dr Elease Hashimoto to fax to the [place]  fax [phone]

## 2015-11-04 NOTE — Telephone Encounter (Signed)
Please advise 

## 2015-11-08 ENCOUNTER — Encounter: Payer: Self-pay | Admitting: Family Medicine

## 2015-11-08 NOTE — Telephone Encounter (Signed)
Letter done

## 2015-11-08 NOTE — Telephone Encounter (Signed)
Letter was faxed to [facility] and I left a message for the pt to return my call.

## 2015-11-08 NOTE — Telephone Encounter (Signed)
Pt aware letter has been faxed 

## 2015-11-11 ENCOUNTER — Other Ambulatory Visit: Payer: Self-pay | Admitting: Cardiology

## 2015-11-11 MED ORDER — LOSARTAN POTASSIUM 25 MG PO TABS: 25.0000 mg | ORAL_TABLET | Freq: Every day | ORAL | 2 refills | Status: DC

## 2015-11-22 ENCOUNTER — Ambulatory Visit (INDEPENDENT_AMBULATORY_CARE_PROVIDER_SITE_OTHER): Payer: Medicare Other | Admitting: Family Medicine

## 2015-11-22 ENCOUNTER — Encounter: Payer: Self-pay | Admitting: Family Medicine

## 2015-11-22 VITALS — BP 120/80 | HR 68 | Temp 98.0°F | Ht 66.0 in | Wt 231.0 lb

## 2015-11-22 DIAGNOSIS — F411 Generalized anxiety disorder: Secondary | ICD-10-CM | POA: Diagnosis not present

## 2015-11-22 DIAGNOSIS — I10 Essential (primary) hypertension: Secondary | ICD-10-CM | POA: Diagnosis not present

## 2015-11-22 DIAGNOSIS — I4891 Unspecified atrial fibrillation: Secondary | ICD-10-CM | POA: Diagnosis not present

## 2015-11-22 MED ORDER — ALPRAZOLAM 1 MG PO TABS: 1.0000 mg | ORAL_TABLET | Freq: Three times a day (TID) | ORAL | 5 refills | Status: DC | PRN

## 2015-11-22 NOTE — Progress Notes (Signed)
Subjective:     Patient ID: [name], female   DOB: 07/27/1945, 70 y.o.   MRN: [id number]  HPI Patient seen for follow-up requesting refill of Alprazolam. She's been on this for many years. She has long history of anxiety and depression. She currently takes both Wellbutrin and Cymbalta. She has very stressful home situation with her husband and also has a son who has had problems with alcohol addiction and multiple medical issues. We've discussed in the past trying to taper back her alprazolam but she is very reluctant. She rarely uses alcohol. No history of misuse. She has multiple chronic problems including history of CAD, past history of DVT, atrial fibrillation, hypertension, diastolic heart failure, hyperlipidemia, history of depression, history of non-Hodgkin's lymphoma. She is on chronic Coumadin and has not been getting her INR monitored regularly.  Past Medical History:  Diagnosis Date  . A-fib ([place])   . ABDOMINAL PAIN RIGHT UPPER QUADRANT 11/08/2008  . ALLERGIC RHINITIS 08/01/2009  . Anticoagulated on Coumadin   . Anxiety   . CAD, NATIVE VESSEL 05/30/2009   cardiologist- dr Aundra Dubin- visit 04-13-2010 in epic-- denies cardiac symptoms  . CARDIOVASCULAR FUNCTION STUDY, ABNORMAL 04/04/2009  . DDD (degenerative disc disease), lumbar   . DEPRESSION 06/13/2008  . DYSLIPIDEMIA 02/27/2009  . DYSPNEA 02/27/2009  . Frequency of urination   . GERD (gastroesophageal reflux disease)   . History of Bell's palsy   . History of DVT of lower extremity 2006-- CHRONIC COUMADIN THERAPY  . History of hiatal hernia   . History of Lyme disease   . 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 NON-HODGKIN'S LYMPHOMA, HX OF dx  2005--  chemoradiation completed 2006--  no recurrence   onocologist- dr odogwu--   . OSTEOARTHRITIS, MODERATE 04/07/2010  . Retroperitoneal fibrosis    Past Surgical History:  Procedure Laterality Date  . BREAST MASS EXCISION     BENIGN--  RIGHT BREAST  . CARDIOVASCULAR STRESS TEST  04-04-2009-- PER PT ASYMPTOMATIC-- MEDICAL MANAGEMENT   STUDY WAS EQUIVOCAL/ EF 49%/ GLOBAL HYPOKINESIS/ APPEARS TO BE A MILD ANTERIOR PERFUSION DEFECT COULD REPRESENT ISCHEMIA BUT DUE TO  ACTIVITY WORSE IN STRESS THAN AT REST POSS. THE DEFECT WAS ARTIFACTUAL  . CARDIOVERSION N/A 07/11/2015   Procedure: CARDIOVERSION;  Surgeon: Larey Dresser, MD;  Location: [place];  Service: Cardiovascular;  Laterality: N/A;  . CT ANGIOGRAM  FEB 2011   CALCIUM SCORE 161/ POSS. MODERATE MID LAD STENOSIS  . CYSTOSCOPY W/ RETROGRADES  04/28/2011   Procedure: CYSTOSCOPY WITH RETROGRADE PYELOGRAM;  Surgeon: Fredricka Bonine, MD;  Location: [facility];  Service: Urology;  Laterality: Left;  . CYSTOSCOPY W/ URETERAL STENT REMOVAL  04/28/2011   Procedure: CYSTOSCOPY WITH STENT REMOVAL;  Surgeon: Fredricka Bonine, MD;  Location: [facility];  Service: Urology;;  . EXPLORATORY LAPAROTOMY  2005   LYMPHOMA  . KNEE ARTHROSCOPY  2005   RIGHT  . MULTIPLE CYSTO/ LEFT URETERAL STENT EXCHANGES  LAST ONE 10-28-10  . RETROPERITONEAL BX  2007  . REVERSE SHOULDER ARTHROPLASTY Right 01/31/2015   Procedure: RIGHT REVERSE SHOULDER ARTHROPLASTY;  Surgeon: Justice Britain, MD;  Location: [place];  Service: Orthopedics;  Laterality: Right;  . TONSILLECTOMY  CHILD  . TOTAL KNEE ARTHROPLASTY  OCT 2010   RIGHT  . TOTAL KNEE ARTHROPLASTY  JAN 2010   LEFT  . TRANSTHORACIC ECHOCARDIOGRAM  [id number]   MILD SYSTOLIC DYSFUNCTION/ EF [id number] MODERATE DIASTOLIC DYSFUCTION/ MILD MR/ MILD BIATRIAL ENLARGEMENT  reports that she quit smoking about 7 years ago. Her smoking use included Cigarettes. She has a 104.00 pack-year smoking history. She has never used smokeless tobacco. She reports that she drinks alcohol. She reports that she does not use drugs. family history includes Arthritis in her mother; Diabetes in her sister; Heart attack in her mother and  sister; Heart disease in her mother; Hypertension in her sister; Stroke in her sister. Allergies  Allergen Reactions  . Chlorhexidine Base Itching    CHG WIPES  . Penicillins Hives and Rash     Review of Systems  Constitutional: Positive for fatigue. Negative for unexpected weight change.  Eyes: Negative for visual disturbance.  Respiratory: Negative for cough, chest tightness, shortness of breath and wheezing.   Cardiovascular: Negative for chest pain, palpitations and leg swelling.  Endocrine: Negative for polydipsia and polyuria.  Neurological: Negative for dizziness, seizures, syncope, weakness, light-headedness and headaches.  Psychiatric/Behavioral: Positive for sleep disturbance. Negative for suicidal ideas. The patient is nervous/anxious.        Objective:   Physical Exam  Constitutional: She is oriented to person, place, and time. She appears well-developed and well-nourished.  Neck: Neck supple. No thyromegaly present.  Cardiovascular: Normal rate.   Pulmonary/Chest: Effort normal and breath sounds normal. No respiratory distress. She has no wheezes. She has no rales.  Musculoskeletal: She exhibits no edema.  Lymphadenopathy:    She has no cervical adenopathy.  Neurological: She is alert and oriented to person, place, and time.  Psychiatric: She has a normal mood and affect. Her behavior is normal.       Assessment:     #1 hypertension stable and at goal. Continue current medications  #2 history of atrial fibrillation on chronic Coumadin. She is delinquent in getting INR assessed  #3 chronic anxiety and depression    Plan:     -Recommend strongly that she schedule to get INR repeated next week -We discussed her trying to taper back alprazolam. She is very reluctant -Refills were given for 6 months -continue Cymbalta and Wellbutrin.   [name] MD [facility]

## 2015-11-28 ENCOUNTER — Ambulatory Visit (INDEPENDENT_AMBULATORY_CARE_PROVIDER_SITE_OTHER): Payer: Medicare Other | Admitting: *Deleted

## 2015-11-28 DIAGNOSIS — Z5181 Encounter for therapeutic drug level monitoring: Secondary | ICD-10-CM

## 2015-11-28 DIAGNOSIS — Z7901 Long term (current) use of anticoagulants: Secondary | ICD-10-CM | POA: Diagnosis not present

## 2015-11-28 DIAGNOSIS — I480 Paroxysmal atrial fibrillation: Secondary | ICD-10-CM | POA: Diagnosis not present

## 2015-11-28 DIAGNOSIS — I824Z9 Acute embolism and thrombosis of unspecified deep veins of unspecified distal lower extremity: Secondary | ICD-10-CM | POA: Diagnosis not present

## 2015-11-28 LAB — POCT INR: INR: 2.4

## 2015-12-20 ENCOUNTER — Other Ambulatory Visit: Payer: Self-pay | Admitting: Family Medicine

## 2016-01-02 ENCOUNTER — Ambulatory Visit (INDEPENDENT_AMBULATORY_CARE_PROVIDER_SITE_OTHER): Payer: Medicare Other | Admitting: *Deleted

## 2016-01-02 DIAGNOSIS — Z5181 Encounter for therapeutic drug level monitoring: Secondary | ICD-10-CM | POA: Diagnosis not present

## 2016-01-02 DIAGNOSIS — I480 Paroxysmal atrial fibrillation: Secondary | ICD-10-CM | POA: Diagnosis not present

## 2016-01-02 DIAGNOSIS — Z7901 Long term (current) use of anticoagulants: Secondary | ICD-10-CM | POA: Diagnosis not present

## 2016-01-02 DIAGNOSIS — I824Z9 Acute embolism and thrombosis of unspecified deep veins of unspecified distal lower extremity: Secondary | ICD-10-CM | POA: Diagnosis not present

## 2016-01-02 LAB — POCT INR: INR: 1.8

## 2016-02-06 ENCOUNTER — Ambulatory Visit (INDEPENDENT_AMBULATORY_CARE_PROVIDER_SITE_OTHER): Payer: Medicare Other | Admitting: Podiatry

## 2016-02-06 ENCOUNTER — Ambulatory Visit (INDEPENDENT_AMBULATORY_CARE_PROVIDER_SITE_OTHER): Payer: Medicare Other | Admitting: *Deleted

## 2016-02-06 ENCOUNTER — Ambulatory Visit (INDEPENDENT_AMBULATORY_CARE_PROVIDER_SITE_OTHER): Payer: Medicare Other

## 2016-02-06 DIAGNOSIS — M2042 Other hammer toe(s) (acquired), left foot: Secondary | ICD-10-CM

## 2016-02-06 DIAGNOSIS — L600 Ingrowing nail: Secondary | ICD-10-CM | POA: Diagnosis not present

## 2016-02-06 DIAGNOSIS — I480 Paroxysmal atrial fibrillation: Secondary | ICD-10-CM

## 2016-02-06 DIAGNOSIS — Z7901 Long term (current) use of anticoagulants: Secondary | ICD-10-CM

## 2016-02-06 DIAGNOSIS — M2041 Other hammer toe(s) (acquired), right foot: Secondary | ICD-10-CM

## 2016-02-06 DIAGNOSIS — L84 Corns and callosities: Secondary | ICD-10-CM | POA: Diagnosis not present

## 2016-02-06 DIAGNOSIS — Z5181 Encounter for therapeutic drug level monitoring: Secondary | ICD-10-CM | POA: Diagnosis not present

## 2016-02-06 DIAGNOSIS — I824Z9 Acute embolism and thrombosis of unspecified deep veins of unspecified distal lower extremity: Secondary | ICD-10-CM | POA: Diagnosis not present

## 2016-02-06 LAB — POCT INR: INR: 2.9

## 2016-02-06 NOTE — Progress Notes (Signed)
   Subjective:    Patient ID: [name], female    DOB: 09/21/1945, 70 y.o.   MRN: [id number]  HPI Chief Complaint  Patient presents with  . Toe Pain    Plantar hallux right - tender, callused area for several months, keeps cushioned  . Toe Pain    Hallux right - medial border, get tender occasionally, wants checked      Review of Systems  Musculoskeletal: Positive for arthralgias, back pain and myalgias.  All other systems reviewed and are negative.      Objective:   Physical Exam        Assessment & Plan:

## 2016-02-07 NOTE — Progress Notes (Signed)
Subjective:     Patient ID: [name], female   DOB: May 23, 1945, 70 y.o.   MRN: [id number]  HPI patient presents with painful lesion underneath right big toe that she does not remember any specific trauma or injury and has been present for several months   Review of Systems  All other systems reviewed and are negative.      Objective:   Physical Exam  Constitutional: She is oriented to person, place, and time.  Cardiovascular: Intact distal pulses.   Musculoskeletal: Normal range of motion.  Neurological: She is oriented to person, place, and time.  Skin: Skin is warm and dry.  Nursing note and vitals reviewed.  neurovascular status intact muscle strength adequate with keratotic lesion sub-hallux right that's painful when pressed and makes shoe gear difficult. It is localized to this area with no drainage or edema noted     Assessment:     Keratotic lesion which may be trauma and orientation or possible plug sweat gland    Plan:     H&P x-rays reviewed and using sterile instrumentation debridement accomplished today and applied padding around the area. Gave instructions on stretching exercises and reappoint to recheck

## 2016-03-05 ENCOUNTER — Ambulatory Visit (INDEPENDENT_AMBULATORY_CARE_PROVIDER_SITE_OTHER): Payer: Medicare Other | Admitting: *Deleted

## 2016-03-05 DIAGNOSIS — Z7901 Long term (current) use of anticoagulants: Secondary | ICD-10-CM | POA: Diagnosis not present

## 2016-03-05 DIAGNOSIS — I480 Paroxysmal atrial fibrillation: Secondary | ICD-10-CM

## 2016-03-05 DIAGNOSIS — Z5181 Encounter for therapeutic drug level monitoring: Secondary | ICD-10-CM | POA: Diagnosis not present

## 2016-03-05 LAB — POCT INR: INR: 1.4

## 2016-03-09 ENCOUNTER — Other Ambulatory Visit: Payer: Self-pay | Admitting: Internal Medicine

## 2016-03-09 ENCOUNTER — Other Ambulatory Visit: Payer: Self-pay | Admitting: Cardiology

## 2016-03-09 DIAGNOSIS — I82402 Acute embolism and thrombosis of unspecified deep veins of left lower extremity: Secondary | ICD-10-CM

## 2016-03-10 NOTE — Telephone Encounter (Signed)
Medication Detail    Disp Refills Start End   Pitavastatin Calcium (LIVALO) 2 MG TABS 30 tablet 12 06/04/2015    Sig: take 1 tablet by mouth once daily   E-Prescribing Status: Receipt confirmed by pharmacy (06/04/2015 4:56 PM EDT)   Pharmacy   [facility], [place]

## 2016-03-14 ENCOUNTER — Other Ambulatory Visit: Payer: Self-pay | Admitting: Cardiology

## 2016-03-16 ENCOUNTER — Other Ambulatory Visit: Payer: Self-pay | Admitting: Cardiology

## 2016-03-17 ENCOUNTER — Other Ambulatory Visit: Payer: Self-pay | Admitting: *Deleted

## 2016-03-17 MED ORDER — PITAVASTATIN CALCIUM 2 MG PO TABS: ORAL_TABLET | ORAL | 3 refills | Status: DC

## 2016-03-19 ENCOUNTER — Ambulatory Visit (INDEPENDENT_AMBULATORY_CARE_PROVIDER_SITE_OTHER): Payer: Medicare Other | Admitting: *Deleted

## 2016-03-19 DIAGNOSIS — Z5181 Encounter for therapeutic drug level monitoring: Secondary | ICD-10-CM | POA: Diagnosis not present

## 2016-03-19 DIAGNOSIS — I480 Paroxysmal atrial fibrillation: Secondary | ICD-10-CM

## 2016-03-19 DIAGNOSIS — Z7901 Long term (current) use of anticoagulants: Secondary | ICD-10-CM | POA: Diagnosis not present

## 2016-03-19 LAB — POCT INR: INR: 1.5

## 2016-04-02 ENCOUNTER — Ambulatory Visit (INDEPENDENT_AMBULATORY_CARE_PROVIDER_SITE_OTHER): Payer: Medicare Other | Admitting: *Deleted

## 2016-04-02 DIAGNOSIS — I480 Paroxysmal atrial fibrillation: Secondary | ICD-10-CM

## 2016-04-02 DIAGNOSIS — Z5181 Encounter for therapeutic drug level monitoring: Secondary | ICD-10-CM | POA: Diagnosis not present

## 2016-04-02 DIAGNOSIS — Z7901 Long term (current) use of anticoagulants: Secondary | ICD-10-CM | POA: Diagnosis not present

## 2016-04-02 LAB — POCT INR: INR: 2.3

## 2016-04-16 ENCOUNTER — Ambulatory Visit (INDEPENDENT_AMBULATORY_CARE_PROVIDER_SITE_OTHER): Payer: Medicare Other | Admitting: Pharmacist

## 2016-04-16 DIAGNOSIS — Z5181 Encounter for therapeutic drug level monitoring: Secondary | ICD-10-CM

## 2016-04-16 DIAGNOSIS — I480 Paroxysmal atrial fibrillation: Secondary | ICD-10-CM | POA: Diagnosis not present

## 2016-04-16 DIAGNOSIS — Z7901 Long term (current) use of anticoagulants: Secondary | ICD-10-CM

## 2016-04-16 LAB — POCT INR: INR: 2.7

## 2016-04-25 ENCOUNTER — Other Ambulatory Visit: Payer: Self-pay | Admitting: Cardiology

## 2016-04-25 DIAGNOSIS — I4819 Other persistent atrial fibrillation: Secondary | ICD-10-CM

## 2016-05-12 ENCOUNTER — Encounter: Payer: Self-pay | Admitting: Family Medicine

## 2016-05-12 ENCOUNTER — Ambulatory Visit (INDEPENDENT_AMBULATORY_CARE_PROVIDER_SITE_OTHER): Payer: Medicare Other | Admitting: Family Medicine

## 2016-05-12 VITALS — BP 130/90 | HR 56 | Ht 66.0 in | Wt 238.0 lb

## 2016-05-12 DIAGNOSIS — B001 Herpesviral vesicular dermatitis: Secondary | ICD-10-CM | POA: Diagnosis not present

## 2016-05-12 MED ORDER — ALPRAZOLAM 1 MG PO TABS: 1.0000 mg | ORAL_TABLET | Freq: Three times a day (TID) | ORAL | 5 refills | Status: DC | PRN

## 2016-05-12 MED ORDER — VALACYCLOVIR HCL 1 G PO TABS: ORAL_TABLET | ORAL | 1 refills | Status: DC

## 2016-05-12 NOTE — Progress Notes (Signed)
Subjective:     Patient ID: [name], female   DOB: 09/01/1945, 71 y.o.   MRN: [id number]  HPI Patient seen with recurrent cold sores. She's had these for several years but recently more frequent recently. She's tried over-the-counter Abreva without much success. She states she's had at least a few over the past several months.  Past Medical History:  Diagnosis Date  . A-fib ([place])   . ABDOMINAL PAIN RIGHT UPPER QUADRANT 11/08/2008  . ALLERGIC RHINITIS 08/01/2009  . Anticoagulated on Coumadin   . Anxiety   . CAD, NATIVE VESSEL 05/30/2009   cardiologist- dr Aundra Dubin- visit 04-13-2010 in epic-- denies cardiac symptoms  . CARDIOVASCULAR FUNCTION STUDY, ABNORMAL 04/04/2009  . DDD (degenerative disc disease), lumbar   . DEPRESSION 06/13/2008  . DYSLIPIDEMIA 02/27/2009  . DYSPNEA 02/27/2009  . Frequency of urination   . GERD (gastroesophageal reflux disease)   . History of Bell's palsy   . History of DVT of lower extremity 2006-- CHRONIC COUMADIN THERAPY  . History of hiatal hernia   . History of Lyme disease   . 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 NON-HODGKIN'S LYMPHOMA, HX OF dx  2005--  chemoradiation completed 2006--  no recurrence   onocologist- dr odogwu--   . OSTEOARTHRITIS, MODERATE 04/07/2010  . Retroperitoneal fibrosis    Past Surgical History:  Procedure Laterality Date  . BREAST MASS EXCISION     BENIGN-- RIGHT BREAST  . CARDIOVASCULAR STRESS TEST  04-04-2009-- PER PT ASYMPTOMATIC-- MEDICAL MANAGEMENT   STUDY WAS EQUIVOCAL/ EF 49%/ GLOBAL HYPOKINESIS/ APPEARS TO BE A MILD ANTERIOR PERFUSION DEFECT COULD REPRESENT ISCHEMIA BUT DUE TO  ACTIVITY WORSE IN STRESS THAN AT REST POSS. THE DEFECT WAS ARTIFACTUAL  . CARDIOVERSION N/A 07/11/2015   Procedure: CARDIOVERSION;  Surgeon: Larey Dresser, MD;  Location: [place];  Service: Cardiovascular;  Laterality: N/A;  . CT ANGIOGRAM  FEB 2011   CALCIUM SCORE 161/ POSS. MODERATE MID LAD  STENOSIS  . CYSTOSCOPY W/ RETROGRADES  04/28/2011   Procedure: CYSTOSCOPY WITH RETROGRADE PYELOGRAM;  Surgeon: Fredricka Bonine, MD;  Location: [facility];  Service: Urology;  Laterality: Left;  . CYSTOSCOPY W/ URETERAL STENT REMOVAL  04/28/2011   Procedure: CYSTOSCOPY WITH STENT REMOVAL;  Surgeon: Fredricka Bonine, MD;  Location: [facility];  Service: Urology;;  . EXPLORATORY LAPAROTOMY  2005   LYMPHOMA  . KNEE ARTHROSCOPY  2005   RIGHT  . MULTIPLE CYSTO/ LEFT URETERAL STENT EXCHANGES  LAST ONE 10-28-10  . RETROPERITONEAL BX  2007  . REVERSE SHOULDER ARTHROPLASTY Right 01/31/2015   Procedure: RIGHT REVERSE SHOULDER ARTHROPLASTY;  Surgeon: Justice Britain, MD;  Location: [place];  Service: Orthopedics;  Laterality: Right;  . TONSILLECTOMY  CHILD  . TOTAL KNEE ARTHROPLASTY  OCT 2010   RIGHT  . TOTAL KNEE ARTHROPLASTY  JAN 2010   LEFT  . TRANSTHORACIC ECHOCARDIOGRAM  [id number]   MILD SYSTOLIC DYSFUNCTION/ EF 99991111 MODERATE DIASTOLIC DYSFUCTION/ MILD MR/ MILD BIATRIAL ENLARGEMENT    reports that she quit smoking about 8 years ago. Her smoking use included Cigarettes. She has a 104.00 pack-year smoking history. She has never used smokeless tobacco. She reports that she drinks alcohol. She reports that she does not use drugs. family history includes Arthritis in her mother; Diabetes in her sister; Heart attack in her mother and sister; Heart disease in her mother; Hypertension in her sister; Stroke in her sister. Allergies  Allergen Reactions  . Chlorhexidine Base Itching  CHG WIPES  . Penicillins Hives and Rash     Review of Systems  Constitutional: Negative for chills and fever.       Objective:   Physical Exam  Constitutional: She appears well-developed and well-nourished.  HENT:  No visible cold sores this time. Oropharynx is clear  Cardiovascular: Normal rate and regular rhythm.   Pulmonary/Chest: Effort normal and breath sounds  normal. No respiratory distress. She has no wheezes. She has no rales.       Assessment:     Recurrent cold sores    Plan:     -Valtrex 2 g at onset and repeat in 12 hours -We discussed importance of sunblock during summer months  [name] MD [facility] at [facility]

## 2016-05-12 NOTE — Progress Notes (Signed)
Pre visit review using our clinic review tool, if applicable. No additional management support is needed unless otherwise documented below in the visit note. 

## 2016-05-12 NOTE — Patient Instructions (Signed)
Cold Sore A cold sore (fever blister) is a skin infection caused by the herpes simplex virus (HSV-1). HSV-1 is closely related to the virus that causes genital herpes (HSV-2), but they are not the same even though both viruses can cause oral and genital infections. Cold sores are small, fluid-filled sores inside of the mouth or on the lips, gums, nose, chin, cheeks, or fingers.  The herpes simplex virus can be easily passed (contagious) to other people through close personal contact, such as kissing or sharing personal items. The virus can also spread to other parts of the body, such as the eyes or genitals. Cold sores are contagious until the sores crust over completely. They often heal within 2 weeks.  Once a person is infected, the herpes simplex virus remains permanently in the body. Therefore, there is no cure for cold sores, and they often recur when a person is tired, stressed, sick, or gets too much sun. Additional factors that can cause a recurrence include hormone changes in menstruation or pregnancy, certain drugs, and cold weather.  CAUSES  Cold sores are caused by the herpes simplex virus. The virus is spread from person to person through close contact, such as through kissing, touching the affected area, or sharing personal items such as lip balm, razors, or eating utensils.  SYMPTOMS  The first infection may not cause symptoms. If symptoms develop, the symptoms often go through different stages. Here is how a cold sore develops:   Tingling, itching, or burning is felt 1-2 days before the outbreak.   Fluid-filled blisters appear on the lips, inside the mouth, nose, or on the cheeks.   The blisters start to ooze clear fluid.   The blisters dry up and a yellow crust appears in its place.   The crust falls off.  Symptoms depend on whether it is the initial outbreak or a recurrence. Some other symptoms with the first outbreak may include:   Fever.   Sore throat.   Headache.    Muscle aches.   Swollen neck glands.  DIAGNOSIS  A diagnosis is often made based on your symptoms and looking at the sores. Sometimes, a sore may be swabbed and then examined in the lab to make a final diagnosis. If the sores are not present, blood tests can find the herpes simplex virus.  TREATMENT  There is no cure for cold sores and no vaccine for the herpes simplex virus. Within 2 weeks, most cold sores go away on their own without treatment. Medicines cannot make the infection go away, but medicine can help relieve some of the pain associated with the sores, can work to stop the virus from multiplying, and can also shorten healing time. Medicine may be in the form of creams, gels, pills, or a shot.  HOME CARE INSTRUCTIONS   Only take over-the-counter or prescription medicines for pain, discomfort, or fever as directed by your caregiver. Do not use aspirin.   Use a cotton-tip swab to apply creams or gels to your sores.   Do not touch the sores or pick the scabs. Wash your hands often. Do not touch your eyes without washing your hands first.   Avoid kissing, oral sex, and sharing personal items until sores heal.   Apply an ice pack on your sores for 10-15 minutes to ease any discomfort.   Avoid hot, cold, or salty foods because they may hurt your mouth. Eat a soft, bland diet to avoid irritating the sores. Use a straw to drink   Avoid hot, cold, or salty foods because they may hurt your mouth. Eat a soft, bland diet to avoid irritating the sores. Use a straw to drink if you have pain when drinking out of a glass.    Keep sores clean and dry to prevent an infection of other tissues.    Avoid the sun and limit stress if these things trigger outbreaks. If sun causes cold sores, apply sunscreen on the lips before being out in the sun.   SEEK MEDICAL CARE IF:    You have a fever or persistent symptoms for more than 2-3 days.    You have a fever and your symptoms suddenly get worse.    You have pus, not clear fluid, coming from the sores.    You have redness that is spreading.    You have pain or irritation in your  eye.    You get sores on your genitals.    Your sores do not heal within 2 weeks.    You have a weakened immune system.    You have frequent recurrences of cold sores.   MAKE SURE YOU:    Understand these instructions.   Will watch your condition.   Will get help right away if you are not doing well or get worse.     This information is not intended to replace advice given to you by your health care provider. Make sure you discuss any questions you have with your health care provider.     Document Released: 03/06/2000 Document Revised: 03/30/2014 Document Reviewed: 12/28/2014  Elsevier Interactive Patient Education 2017 [employer]

## 2016-05-14 ENCOUNTER — Ambulatory Visit (INDEPENDENT_AMBULATORY_CARE_PROVIDER_SITE_OTHER): Payer: Medicare Other

## 2016-05-14 DIAGNOSIS — Z7901 Long term (current) use of anticoagulants: Secondary | ICD-10-CM | POA: Diagnosis not present

## 2016-05-14 DIAGNOSIS — I480 Paroxysmal atrial fibrillation: Secondary | ICD-10-CM | POA: Diagnosis not present

## 2016-05-14 DIAGNOSIS — Z5181 Encounter for therapeutic drug level monitoring: Secondary | ICD-10-CM

## 2016-05-14 LAB — POCT INR: INR: 1.9

## 2016-05-17 ENCOUNTER — Other Ambulatory Visit: Payer: Self-pay | Admitting: Internal Medicine

## 2016-05-17 ENCOUNTER — Other Ambulatory Visit: Payer: Self-pay | Admitting: Cardiology

## 2016-05-17 DIAGNOSIS — I82402 Acute embolism and thrombosis of unspecified deep veins of left lower extremity: Secondary | ICD-10-CM

## 2016-06-11 ENCOUNTER — Ambulatory Visit (INDEPENDENT_AMBULATORY_CARE_PROVIDER_SITE_OTHER): Payer: Medicare Other | Admitting: *Deleted

## 2016-06-11 DIAGNOSIS — I480 Paroxysmal atrial fibrillation: Secondary | ICD-10-CM

## 2016-06-11 DIAGNOSIS — Z7901 Long term (current) use of anticoagulants: Secondary | ICD-10-CM | POA: Diagnosis not present

## 2016-06-11 DIAGNOSIS — Z5181 Encounter for therapeutic drug level monitoring: Secondary | ICD-10-CM | POA: Diagnosis not present

## 2016-06-11 LAB — POCT INR: INR: 2

## 2016-06-13 ENCOUNTER — Other Ambulatory Visit: Payer: Self-pay | Admitting: Family Medicine

## 2016-06-25 ENCOUNTER — Other Ambulatory Visit: Payer: Self-pay | Admitting: Family Medicine

## 2016-07-09 ENCOUNTER — Ambulatory Visit (INDEPENDENT_AMBULATORY_CARE_PROVIDER_SITE_OTHER): Payer: Medicare Other | Admitting: Pharmacist

## 2016-07-09 DIAGNOSIS — I480 Paroxysmal atrial fibrillation: Secondary | ICD-10-CM | POA: Diagnosis not present

## 2016-07-09 DIAGNOSIS — Z5181 Encounter for therapeutic drug level monitoring: Secondary | ICD-10-CM | POA: Diagnosis not present

## 2016-07-09 DIAGNOSIS — Z7901 Long term (current) use of anticoagulants: Secondary | ICD-10-CM

## 2016-07-09 LAB — POCT INR: INR: 2.3

## 2016-07-11 ENCOUNTER — Other Ambulatory Visit: Payer: Self-pay | Admitting: Cardiology

## 2016-07-11 ENCOUNTER — Other Ambulatory Visit: Payer: Self-pay | Admitting: Internal Medicine

## 2016-07-11 DIAGNOSIS — I82402 Acute embolism and thrombosis of unspecified deep veins of left lower extremity: Secondary | ICD-10-CM

## 2016-07-11 DIAGNOSIS — I4819 Other persistent atrial fibrillation: Secondary | ICD-10-CM

## 2016-07-17 ENCOUNTER — Other Ambulatory Visit: Payer: Self-pay | Admitting: *Deleted

## 2016-07-17 MED ORDER — ZOSTER VAC RECOMB ADJUVANTED 50 MCG/0.5ML IM SUSR: 0.5000 mL | Freq: Once | INTRAMUSCULAR | 1 refills | Status: AC

## 2016-07-20 ENCOUNTER — Other Ambulatory Visit: Payer: Self-pay | Admitting: Cardiology

## 2016-07-20 MED ORDER — NITROGLYCERIN 0.4 MG SL SUBL: SUBLINGUAL_TABLET | SUBLINGUAL | 0 refills | Status: DC

## 2016-07-28 ENCOUNTER — Other Ambulatory Visit: Payer: Self-pay | Admitting: Cardiology

## 2016-08-12 ENCOUNTER — Other Ambulatory Visit: Payer: Self-pay | Admitting: Cardiology

## 2016-08-13 DIAGNOSIS — M79672 Pain in left foot: Secondary | ICD-10-CM | POA: Diagnosis not present

## 2016-08-13 DIAGNOSIS — I70203 Unspecified atherosclerosis of native arteries of extremities, bilateral legs: Secondary | ICD-10-CM | POA: Diagnosis not present

## 2016-08-13 DIAGNOSIS — M79671 Pain in right foot: Secondary | ICD-10-CM | POA: Diagnosis not present

## 2016-08-13 DIAGNOSIS — M2042 Other hammer toe(s) (acquired), left foot: Secondary | ICD-10-CM | POA: Diagnosis not present

## 2016-08-13 DIAGNOSIS — B351 Tinea unguium: Secondary | ICD-10-CM | POA: Diagnosis not present

## 2016-08-24 ENCOUNTER — Ambulatory Visit (INDEPENDENT_AMBULATORY_CARE_PROVIDER_SITE_OTHER): Payer: Medicare Other | Admitting: *Deleted

## 2016-08-24 DIAGNOSIS — I480 Paroxysmal atrial fibrillation: Secondary | ICD-10-CM

## 2016-08-24 DIAGNOSIS — Z5181 Encounter for therapeutic drug level monitoring: Secondary | ICD-10-CM | POA: Diagnosis not present

## 2016-08-24 DIAGNOSIS — Z7901 Long term (current) use of anticoagulants: Secondary | ICD-10-CM

## 2016-08-24 LAB — POCT INR: INR: 3.1

## 2016-08-31 ENCOUNTER — Other Ambulatory Visit: Payer: Self-pay | Admitting: Cardiology

## 2016-08-31 DIAGNOSIS — I4819 Other persistent atrial fibrillation: Secondary | ICD-10-CM

## 2016-09-01 ENCOUNTER — Telehealth: Payer: Self-pay | Admitting: Family Medicine

## 2016-09-01 MED ORDER — DULOXETINE HCL 60 MG PO CPEP: 60.0000 mg | ORAL_CAPSULE | Freq: Every day | ORAL | 0 refills | Status: DC

## 2016-09-01 NOTE — Telephone Encounter (Signed)
Pharmacy called for pt to request a refill of DULoxetine (CYMBALTA) 60 MG capsule  They are unable to fax right now

## 2016-09-08 ENCOUNTER — Other Ambulatory Visit: Payer: Self-pay | Admitting: Cardiology

## 2016-09-11 ENCOUNTER — Encounter: Payer: Self-pay | Admitting: Gastroenterology

## 2016-09-20 ENCOUNTER — Other Ambulatory Visit: Payer: Self-pay | Admitting: Cardiology

## 2016-10-05 ENCOUNTER — Other Ambulatory Visit: Payer: Self-pay | Admitting: Cardiology

## 2016-10-05 ENCOUNTER — Other Ambulatory Visit: Payer: Self-pay | Admitting: Internal Medicine

## 2016-10-05 DIAGNOSIS — I82402 Acute embolism and thrombosis of unspecified deep veins of left lower extremity: Secondary | ICD-10-CM

## 2016-10-05 DIAGNOSIS — I4819 Other persistent atrial fibrillation: Secondary | ICD-10-CM

## 2016-10-12 ENCOUNTER — Other Ambulatory Visit: Payer: Self-pay | Admitting: Cardiology

## 2016-10-12 DIAGNOSIS — I4819 Other persistent atrial fibrillation: Secondary | ICD-10-CM

## 2016-10-12 MED ORDER — METOPROLOL SUCCINATE ER 25 MG PO TB24: 25.0000 mg | ORAL_TABLET | Freq: Every day | ORAL | 2 refills | Status: DC

## 2016-10-12 MED ORDER — DILTIAZEM HCL ER COATED BEADS 180 MG PO CP24: 180.0000 mg | ORAL_CAPSULE | Freq: Two times a day (BID) | ORAL | 2 refills | Status: DC

## 2016-10-12 NOTE — Telephone Encounter (Signed)
Pt's medications was sent to pt's pharmacy as requested. Confirmation received.  

## 2016-10-12 NOTE — Telephone Encounter (Signed)
New message   *STAT* If patient is at the pharmacy, call can be transferred to refill team.   1. Which medications need to be refilled? (please list name of each medication and dose if known) diltiazem (CARDIZEM CD) 180 MG 24 hr capsule metoprolol succinate (TOPROL-XL) 25 MG 24 hr tablet  2. Which pharmacy/location (including street and city if local pharmacy) is medication to be sent to? [facility] going out of business - meds transfer [place]   3. Do they need a 30 day or 90 day supply? 30 days

## 2016-10-14 ENCOUNTER — Ambulatory Visit (INDEPENDENT_AMBULATORY_CARE_PROVIDER_SITE_OTHER): Payer: Medicare Other | Admitting: Family Medicine

## 2016-10-14 ENCOUNTER — Encounter: Payer: Self-pay | Admitting: Family Medicine

## 2016-10-14 VITALS — BP 120/70 | HR 79 | Temp 98.4°F | Wt 232.7 lb

## 2016-10-14 DIAGNOSIS — M546 Pain in thoracic spine: Secondary | ICD-10-CM | POA: Diagnosis not present

## 2016-10-14 MED ORDER — OXYCODONE HCL 5 MG PO TABS: 5.0000 mg | ORAL_TABLET | Freq: Four times a day (QID) | ORAL | 0 refills | Status: DC | PRN

## 2016-10-14 NOTE — Progress Notes (Signed)
Subjective:     Patient ID: [name], female   DOB: [birth year], 71 y.o.   MRN: [id number]  HPI Patient seen for acute visit for back pain. She states she's had 2 falls in the past month. First one occurred on 7/10. She was going from her garage into her house which is up 3 steps. On both falls she had slippers on and attributed her falls to her slippers coming off her feet. She landed on her buttock with the first fall and since that times had pain right lower thoracic region. This is off to the side and not over the spine itself. No pleuritic pain. No dysuria. No gross hematuria. Pain is worse supine. Not relieved with Tylenol. Moderate to severe at times. Denies radiculitis symptoms. No lower lumbar pain.  Past Medical History:  Diagnosis Date  . A-fib ([place])   . ABDOMINAL PAIN RIGHT UPPER QUADRANT 11/08/2008  . ALLERGIC RHINITIS 08/01/2009  . Anticoagulated on Coumadin   . Anxiety   . CAD, NATIVE VESSEL 05/30/2009   cardiologist- dr Aundra Dubin- visit 04-13-2010 in epic-- denies cardiac symptoms  . CARDIOVASCULAR FUNCTION STUDY, ABNORMAL 04/04/2009  . DDD (degenerative disc disease), lumbar   . DEPRESSION 06/13/2008  . DYSLIPIDEMIA 02/27/2009  . DYSPNEA 02/27/2009  . Frequency of urination   . GERD (gastroesophageal reflux disease)   . History of Bell's palsy   . History of DVT of lower extremity 2006-- CHRONIC COUMADIN THERAPY  . History of hiatal hernia   . History of Lyme disease   . 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 NON-HODGKIN'S LYMPHOMA, HX OF dx  2005--  chemoradiation completed 2006--  no recurrence   onocologist- dr odogwu--   . OSTEOARTHRITIS, MODERATE 04/07/2010  . Retroperitoneal fibrosis    Past Surgical History:  Procedure Laterality Date  . BREAST MASS EXCISION     BENIGN-- RIGHT BREAST  . CARDIOVASCULAR STRESS TEST  04-04-2009-- PER PT ASYMPTOMATIC-- MEDICAL MANAGEMENT   STUDY WAS EQUIVOCAL/ EF 49%/ GLOBAL HYPOKINESIS/ APPEARS  TO BE A MILD ANTERIOR PERFUSION DEFECT COULD REPRESENT ISCHEMIA BUT DUE TO  ACTIVITY WORSE IN STRESS THAN AT REST POSS. THE DEFECT WAS ARTIFACTUAL  . CARDIOVERSION N/A 07/11/2015   Procedure: CARDIOVERSION;  Surgeon: Larey Dresser, MD;  Location: [place];  Service: Cardiovascular;  Laterality: N/A;  . CT ANGIOGRAM  FEB 2011   CALCIUM SCORE 161/ POSS. MODERATE MID LAD STENOSIS  . CYSTOSCOPY W/ RETROGRADES  04/28/2011   Procedure: CYSTOSCOPY WITH RETROGRADE PYELOGRAM;  Surgeon: Fredricka Bonine, MD;  Location: [facility];  Service: Urology;  Laterality: Left;  . CYSTOSCOPY W/ URETERAL STENT REMOVAL  04/28/2011   Procedure: CYSTOSCOPY WITH STENT REMOVAL;  Surgeon: Fredricka Bonine, MD;  Location: [facility];  Service: Urology;;  . EXPLORATORY LAPAROTOMY  2005   LYMPHOMA  . KNEE ARTHROSCOPY  2005   RIGHT  . MULTIPLE CYSTO/ LEFT URETERAL STENT EXCHANGES  LAST ONE 10-28-10  . RETROPERITONEAL BX  2007  . REVERSE SHOULDER ARTHROPLASTY Right 01/31/2015   Procedure: RIGHT REVERSE SHOULDER ARTHROPLASTY;  Surgeon: Justice Britain, MD;  Location: [place];  Service: Orthopedics;  Laterality: Right;  . TONSILLECTOMY  CHILD  . TOTAL KNEE ARTHROPLASTY  OCT 2010   RIGHT  . TOTAL KNEE ARTHROPLASTY  JAN 2010   LEFT  . TRANSTHORACIC ECHOCARDIOGRAM  [id number]   MILD SYSTOLIC DYSFUNCTION/ EF 30-86%/ MODERATE DIASTOLIC DYSFUCTION/ MILD MR/ MILD BIATRIAL ENLARGEMENT    reports that she quit smoking  about 8 years ago. Her smoking use included Cigarettes. She has a 104.00 pack-year smoking history. She has never used smokeless tobacco. She reports that she drinks alcohol. She reports that she does not use drugs. family history includes Arthritis in her mother; Diabetes in her sister; Heart attack in her mother and sister; Heart disease in her mother; Hypertension in her sister; Stroke in her sister. Allergies  Allergen Reactions  . Chlorhexidine Base Itching    CHG  WIPES  . Penicillins Hives and Rash     Review of Systems  Respiratory: Negative for shortness of breath.   Cardiovascular: Negative for chest pain.  Gastrointestinal: Negative for abdominal pain.  Genitourinary: Negative for dysuria, flank pain and hematuria.  Musculoskeletal: Positive for back pain.       Objective:   Physical Exam  Constitutional: She is oriented to person, place, and time. She appears well-developed and well-nourished.  Cardiovascular: Normal rate and regular rhythm.   Pulmonary/Chest: Effort normal and breath sounds normal. No respiratory distress. She has no wheezes. She has no rales.  No visible bruising  Musculoskeletal:  No spinal tenderness  Neurological: She is alert and oriented to person, place, and time.       Assessment:     Status post recent fall with right lower thoracic back pain. No spinal pain. Suspect contusion /soft tissue injury    Plan:     -Patient must avoid nonsteroidals because of her chronic Coumadin therapy -Heat or ice for symptom relief -Limited oxycodone 5 mg 1 every 6 hours as needed for severe pain #20 with no refill -Consider x-rays if not improving over the next couple weeks  [name] MD [facility]

## 2016-10-27 ENCOUNTER — Other Ambulatory Visit: Payer: Self-pay | Admitting: Family Medicine

## 2016-10-27 NOTE — Telephone Encounter (Signed)
Last refill 05/12/16 and last office visit 10/14/16.  Okay to fill?

## 2016-10-28 NOTE — Telephone Encounter (Signed)
Refill with 2 additional refills.  I would like 3 month follow up.  Will discuss possible tapering back at that time.

## 2016-11-30 ENCOUNTER — Ambulatory Visit (INDEPENDENT_AMBULATORY_CARE_PROVIDER_SITE_OTHER): Payer: Medicare Other | Admitting: *Deleted

## 2016-11-30 ENCOUNTER — Encounter (INDEPENDENT_AMBULATORY_CARE_PROVIDER_SITE_OTHER): Payer: Self-pay

## 2016-11-30 DIAGNOSIS — Z5181 Encounter for therapeutic drug level monitoring: Secondary | ICD-10-CM

## 2016-11-30 DIAGNOSIS — Z7901 Long term (current) use of anticoagulants: Secondary | ICD-10-CM

## 2016-11-30 DIAGNOSIS — I480 Paroxysmal atrial fibrillation: Secondary | ICD-10-CM

## 2016-11-30 LAB — POCT INR: INR: 5.1

## 2016-12-02 ENCOUNTER — Other Ambulatory Visit (HOSPITAL_COMMUNITY): Payer: Self-pay | Admitting: Cardiology

## 2016-12-02 DIAGNOSIS — H2513 Age-related nuclear cataract, bilateral: Secondary | ICD-10-CM | POA: Diagnosis not present

## 2016-12-03 MED ORDER — FUROSEMIDE 20 MG PO TABS: 20.0000 mg | ORAL_TABLET | Freq: Every day | ORAL | 0 refills | Status: DC

## 2016-12-03 NOTE — Telephone Encounter (Signed)
Please keep upcoming appointment for further refills

## 2016-12-10 ENCOUNTER — Other Ambulatory Visit: Payer: Self-pay | Admitting: Cardiology

## 2016-12-10 MED ORDER — LOSARTAN POTASSIUM 25 MG PO TABS: 25.0000 mg | ORAL_TABLET | Freq: Every day | ORAL | 0 refills | Status: DC

## 2016-12-11 ENCOUNTER — Ambulatory Visit (INDEPENDENT_AMBULATORY_CARE_PROVIDER_SITE_OTHER): Payer: Medicare Other | Admitting: *Deleted

## 2016-12-11 DIAGNOSIS — Z5181 Encounter for therapeutic drug level monitoring: Secondary | ICD-10-CM

## 2016-12-11 DIAGNOSIS — I480 Paroxysmal atrial fibrillation: Secondary | ICD-10-CM | POA: Diagnosis not present

## 2016-12-11 DIAGNOSIS — Z7901 Long term (current) use of anticoagulants: Secondary | ICD-10-CM

## 2016-12-11 LAB — POCT INR: INR: 1.6

## 2016-12-11 MED ORDER — WARFARIN SODIUM 5 MG PO TABS: ORAL_TABLET | ORAL | 3 refills | Status: DC

## 2016-12-28 ENCOUNTER — Encounter: Payer: Self-pay | Admitting: Cardiology

## 2017-01-06 ENCOUNTER — Other Ambulatory Visit: Payer: Self-pay | Admitting: Cardiology

## 2017-01-06 ENCOUNTER — Telehealth: Payer: Self-pay | Admitting: Cardiology

## 2017-01-06 NOTE — Telephone Encounter (Signed)
New Message  Pt call requesting to speak with coumadin about getting her appt for 10/22 moved closer to her appt with Dr. Radford Pax for same day at 1:40pm. Pt states she live out of town and does not want to make two drives if she could help it. Please call back to discuss

## 2017-01-06 NOTE — Telephone Encounter (Signed)
Returned call to the pt & she expressed that she needs to come to see Coumadin Clinic around same time as she has Dr. Radford Pax. Advised pt that will move appt to a closer time & that we will work her in after Dr. Theodosia Blender app & she verbalized understanding.

## 2017-01-07 ENCOUNTER — Other Ambulatory Visit: Payer: Self-pay | Admitting: Cardiology

## 2017-01-07 DIAGNOSIS — I4819 Other persistent atrial fibrillation: Secondary | ICD-10-CM

## 2017-01-08 ENCOUNTER — Other Ambulatory Visit (HOSPITAL_COMMUNITY): Payer: Self-pay | Admitting: Cardiology

## 2017-01-08 MED ORDER — DILTIAZEM HCL ER COATED BEADS 180 MG PO CP24: 180.0000 mg | ORAL_CAPSULE | Freq: Two times a day (BID) | ORAL | 2 refills | Status: DC

## 2017-01-08 MED ORDER — FUROSEMIDE 20 MG PO TABS: 20.0000 mg | ORAL_TABLET | Freq: Every day | ORAL | 0 refills | Status: DC

## 2017-01-08 NOTE — Progress Notes (Signed)
Refilled per patient request. She has an office visit with Dr. Radford Pax.

## 2017-01-11 ENCOUNTER — Encounter: Payer: Self-pay | Admitting: Cardiology

## 2017-01-11 ENCOUNTER — Ambulatory Visit (INDEPENDENT_AMBULATORY_CARE_PROVIDER_SITE_OTHER): Payer: Medicare Other | Admitting: *Deleted

## 2017-01-11 ENCOUNTER — Ambulatory Visit (INDEPENDENT_AMBULATORY_CARE_PROVIDER_SITE_OTHER): Payer: Medicare Other | Admitting: Cardiology

## 2017-01-11 VITALS — BP 124/80 | HR 65 | Ht 66.0 in | Wt 231.0 lb

## 2017-01-11 DIAGNOSIS — I5032 Chronic diastolic (congestive) heart failure: Secondary | ICD-10-CM | POA: Diagnosis not present

## 2017-01-11 DIAGNOSIS — E7800 Pure hypercholesterolemia, unspecified: Secondary | ICD-10-CM | POA: Diagnosis not present

## 2017-01-11 DIAGNOSIS — I481 Persistent atrial fibrillation: Secondary | ICD-10-CM

## 2017-01-11 DIAGNOSIS — Z7901 Long term (current) use of anticoagulants: Secondary | ICD-10-CM

## 2017-01-11 DIAGNOSIS — I480 Paroxysmal atrial fibrillation: Secondary | ICD-10-CM | POA: Diagnosis not present

## 2017-01-11 DIAGNOSIS — I420 Dilated cardiomyopathy: Secondary | ICD-10-CM

## 2017-01-11 DIAGNOSIS — I482 Chronic atrial fibrillation: Secondary | ICD-10-CM | POA: Diagnosis not present

## 2017-01-11 DIAGNOSIS — I050 Rheumatic mitral stenosis: Secondary | ICD-10-CM

## 2017-01-11 DIAGNOSIS — I4821 Permanent atrial fibrillation: Secondary | ICD-10-CM

## 2017-01-11 DIAGNOSIS — I4819 Other persistent atrial fibrillation: Secondary | ICD-10-CM

## 2017-01-11 DIAGNOSIS — Z5181 Encounter for therapeutic drug level monitoring: Secondary | ICD-10-CM

## 2017-01-11 DIAGNOSIS — I2510 Atherosclerotic heart disease of native coronary artery without angina pectoris: Secondary | ICD-10-CM

## 2017-01-11 LAB — POCT INR: INR: 3.5

## 2017-01-11 MED ORDER — PITAVASTATIN CALCIUM 2 MG PO TABS: 1.0000 | ORAL_TABLET | Freq: Every day | ORAL | 11 refills | Status: DC

## 2017-01-11 MED ORDER — METOPROLOL SUCCINATE ER 25 MG PO TB24: 25.0000 mg | ORAL_TABLET | Freq: Every day | ORAL | 11 refills | Status: DC

## 2017-01-11 NOTE — Progress Notes (Signed)
Cardiology Office Note:    Date:  01/11/2017   ID:  [name], DOB 1945-10-25, MRN [id number]  PCP:  [name], MD  Cardiologist:  Fransico Him, MD   Referring MD: [name], MD   Chief Complaint  Patient presents with  . Atrial Fibrillation  . Hypertension  . Coronary Artery Disease  . Hyperlipidemia    History of Present Illness:    [name] is a 71 y.o. female with a hx of retroperitoneal fibrosis and non-Hodgkin's lymphoma as well as prior DVT.  She has a history of coronary artery calcifications with a moderate calcium score of 161 (putting her at a high risk percentile for her age) and a possible moderate mid LAD stenosis (though some degradation of images by respiratory artifact). Echo showed mildly reduced  LV systolic function (reported as mildly decreased on myoview),  EF 45-50% with moderate diastolic dysfunction. The patient opted for medical therapy.  She was diagnosed with paroxysmal atrial fibrillation and was started on warfarin and diltiazem CD.  Last echo in 8/15 showed EF 50-55%, moderate LV hypertrophy, probably mild mitral stenosis, normal RV size and systolic function.  She was initially on dronedarone to try to maintain NSR, but developed persistent atrial fibrillation. She was changed to amiodarone and underwent cardioversion to NSR in 4/17 but went back into atrial fibrillation.  The decision was made to pursue rate control and her amio was stopped.  She also has a history of chronic diastolic CHF triggered by afib in the past.    She is here today for followup and is doing well.  She denies any chest pain or pressure, PND, orthopnea,  dizziness, palpitations or syncope. She has been having some SOB recently that is new but mainly when she is hurrying and with stairs. She has chronic intermittent LE edema but sometimes to forgets to take her diuretic.  She is compliant with her meds and is tolerating meds with no SE.    Past  Medical History:  Diagnosis Date  . ABDOMINAL PAIN RIGHT UPPER QUADRANT 11/08/2008  . ALLERGIC RHINITIS 08/01/2009  . Anticoagulated on Coumadin   . Anxiety   . CAD, NATIVE VESSEL 05/30/2009   moderate risk coronary calcium score by chest CT  . DDD (degenerative disc disease), lumbar   . DEPRESSION 06/13/2008  . DYSLIPIDEMIA 02/27/2009  . DYSPNEA 02/27/2009  . Frequency of urination   . GERD (gastroesophageal reflux disease)   . History of Bell's palsy   . History of DVT of lower extremity 2006-- CHRONIC COUMADIN THERAPY  . History of hiatal hernia   . History of Lyme disease   . 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 Mitral stenosis 11/13/2013   mild by echo  . NON-HODGKIN'S LYMPHOMA, HX OF dx  2005--  chemoradiation completed 2006--  no recurrence   onocologist- dr odogwu--   . OSTEOARTHRITIS, MODERATE 04/07/2010  . Permanent atrial fibrillation ([place])   . Retroperitoneal fibrosis     Past Surgical History:  Procedure Laterality Date  . BREAST MASS EXCISION     BENIGN-- RIGHT BREAST  . CARDIOVASCULAR STRESS TEST  04-04-2009-- PER PT ASYMPTOMATIC-- MEDICAL MANAGEMENT   STUDY WAS EQUIVOCAL/ EF 49%/ GLOBAL HYPOKINESIS/ APPEARS TO BE A MILD ANTERIOR PERFUSION DEFECT COULD REPRESENT ISCHEMIA BUT DUE TO  ACTIVITY WORSE IN STRESS THAN AT REST POSS. THE DEFECT WAS ARTIFACTUAL  . CARDIOVERSION N/A 07/11/2015   Procedure: CARDIOVERSION;  Surgeon: Larey Dresser, MD;  Location: [facility]  ENDOSCOPY;  Service: Cardiovascular;  Laterality: N/A;  . CT ANGIOGRAM  FEB 2011   CALCIUM SCORE 161/ POSS. MODERATE MID LAD STENOSIS  . CYSTOSCOPY W/ RETROGRADES  04/28/2011   Procedure: CYSTOSCOPY WITH RETROGRADE PYELOGRAM;  Surgeon: Fredricka Bonine, MD;  Location: [facility];  Service: Urology;  Laterality: Left;  . CYSTOSCOPY W/ URETERAL STENT REMOVAL  04/28/2011   Procedure: CYSTOSCOPY WITH STENT REMOVAL;  Surgeon: Fredricka Bonine, MD;  Location:  [facility];  Service: Urology;;  . EXPLORATORY LAPAROTOMY  2005   LYMPHOMA  . KNEE ARTHROSCOPY  2005   RIGHT  . MULTIPLE CYSTO/ LEFT URETERAL STENT EXCHANGES  LAST ONE 10-28-10  . RETROPERITONEAL BX  2007  . REVERSE SHOULDER ARTHROPLASTY Right 01/31/2015   Procedure: RIGHT REVERSE SHOULDER ARTHROPLASTY;  Surgeon: Justice Britain, MD;  Location: [place];  Service: Orthopedics;  Laterality: Right;  . TONSILLECTOMY  CHILD  . TOTAL KNEE ARTHROPLASTY  OCT 2010   RIGHT  . TOTAL KNEE ARTHROPLASTY  JAN 2010   LEFT  . TRANSTHORACIC ECHOCARDIOGRAM  [id number]   MILD SYSTOLIC DYSFUNCTION/ EF 17-61%/ MODERATE DIASTOLIC DYSFUCTION/ MILD MR/ MILD BIATRIAL ENLARGEMENT    Current Medications: Current Meds  Medication Sig  . ALPRAZolam (XANAX) 1 MG tablet take 1 tablet by mouth three times a day if needed for anxiety  . b complex vitamins capsule Take 2 capsules by mouth daily.   . Biotin 5 MG CAPS Take by mouth.  Marland Kitchen buPROPion (WELLBUTRIN XL) 300 MG 24 hr tablet take 1 tablet by mouth once daily  . Cholecalciferol (VITAMIN D) 2000 UNITS CAPS Take 2,000 Int'l Units by mouth.  . Coenzyme Q10-Fish Oil-Vit E (CO-Q 10 OMEGA-3 FISH OIL PO) Take 100 mg elemental calcium/kg/hr by mouth daily.  Marland Kitchen diltiazem (CARDIZEM CD) 180 MG 24 hr capsule Take 1 capsule (180 mg total) by mouth 2 (two) times daily.  . DULoxetine (CYMBALTA) 60 MG capsule Take 1 capsule (60 mg total) by mouth daily.  . fluticasone (FLONASE) 50 MCG/ACT nasal spray instill 2 sprays into each nostril once daily (Patient taking differently: instill 2 sprays into each nostril once daily as needed)  . furosemide (LASIX) 20 MG tablet Take 1 tablet (20 mg total) by mouth daily. Please keep upcoming appointment for further refills  . LIVALO 2 MG TABS take 1 tablet by mouth once daily  . loratadine (CLARITIN) 10 MG tablet Take 10 mg by mouth daily.   Marland Kitchen losartan (COZAAR) 25 MG tablet Take 1 tablet (25 mg total) by mouth daily.  Marland Kitchen Lysine 500 MG  TABS Take 500 mg by mouth daily.  . metoprolol succinate (TOPROL-XL) 25 MG 24 hr tablet Take 1 tablet (25 mg total) by mouth daily.  . nitroGLYCERIN (NITROSTAT) 0.4 MG SL tablet place 1 tablet under the tongue every 5 minutes for UP TO 3 doses if needed  . oxyCODONE (OXY IR/ROXICODONE) 5 MG immediate release tablet Take 1 tablet (5 mg total) by mouth every 6 (six) hours as needed for severe pain.  . valACYclovir (VALTREX) 1000 MG tablet Take 2 tablets at onset of cold sores and repeat 2 in 12 hours.  Marland Kitchen warfarin (COUMADIN) 5 MG tablet take 1 tablet by mouth once daily except 1/2 tablet on Sun and Tues OR AS DIRECTED     Allergies:   Chlorhexidine base and Penicillins   Social History   Social History  . Marital status: Married    Spouse name: N/A  . Number of children:  N/A  . Years of education: N/A   Social History Main Topics  . Smoking status: Former Smoker    Packs/day: 2.00    Years: 52.00    Types: Cigarettes    Quit date: 03/24/2008  . Smokeless tobacco: Never Used  . Alcohol use Yes     Comment: RARE  . Drug use: No  . Sexual activity: Not Asked   Other Topics Concern  . None   Social History Narrative  . None     Family History: The patient's family history includes Arthritis in her mother; Diabetes in her sister; Heart attack in her mother and sister; Heart disease in her mother; Hypertension in her sister; Stroke in her sister.  ROS:   Please see the history of present illness.    ROS  All other systems reviewed and negative.   EKGs/Labs/Other Studies Reviewed:    The following studies were reviewed today: none  EKG:  EKG is ordered today and showed atrial fibrillation at 65bpm with no ST changes  Recent Labs: No results found for requested labs within last 8760 hours.   Recent Lipid Panel    Component Value Date/Time   CHOL 129 09/13/2015 1013   TRIG 83 09/13/2015 1013   HDL 58 09/13/2015 1013   CHOLHDL 2.2 09/13/2015 1013   VLDL 17 09/13/2015  1013   LDLCALC 54 09/13/2015 1013    Physical Exam:    VS:  BP 124/80   Pulse 65   Ht 5\' 6"  (1.676 m)   Wt 231 lb (104.8 kg)   BMI 37.28 kg/m     Wt Readings from Last 3 Encounters:  01/11/17 231 lb (104.8 kg)  10/14/16 232 lb 11.2 oz (105.6 kg)  05/12/16 238 lb (108 kg)     GEN:  Well nourished, well developed in no acute distress HEENT: Normal NECK: No JVD; No carotid bruits LYMPHATICS: No lymphadenopathy CARDIAC: irregularly irregular, no murmurs, rubs, gallops RESPIRATORY:  Clear to auscultation without rales, wheezing or rhonchi  ABDOMEN: Soft, non-tender, non-distended MUSCULOSKELETAL:  No edema; No deformity  SKIN: Warm and dry NEUROLOGIC:  Alert and oriented x 3 PSYCHIATRIC:  Normal affect   ASSESSMENT:    1. Permanent atrial fibrillation ([place])   2. Dilated cardiomyopathy ([place])   3. Atherosclerosis of native coronary artery of native heart without angina pectoris   4. Chronic diastolic CHF (congestive heart failure) ([place])   5. Pure hypercholesterolemia   6. Mitral valve stenosis, unspecified etiology    PLAN:    In order of problems listed above:  1. Permanent atrial fibrillation - her HR is well controlled in chronic afib.  She is fairly asymptomatic with no palpitations.  She will continue on Cardizem CD 180mg  daily and warfarin.   2.  DCM - EF 50-55% by echo 2015.    3.  Coronary artery calcifications with moderate calcium score.  She denies any chest pain or pressure.  Her last nuclear stress test was 2011.   I will get a nuclear stress test given her SOB to rule out ischemia.   4.  Chronic diastolic CHF - this occurred in setting of afib with RVR. She appears euvolemic on exam and will continue diuretics.   5.  Hyperlipidemia - LDL goal < 70. She is statin intolerant with Lipitor or crestor.  She is now on Livalo daily.  Her LDL a year ago was 54. I will repeat an FLP and ALT.    4. Mild mitral stenosis by  echo 2015 - I will repeat an echo to make  sure this has not progressed as she Is having more SOB. She will continue on BB and Lasix.     Medication Adjustments/Labs and Tests Ordered: Current medicines are reviewed at length with the patient today.  Concerns regarding medicines are outlined above.  No orders of the defined types were placed in this encounter.  No orders of the defined types were placed in this encounter.   Signed, Fransico Him, MD  01/11/2017 2:50 PM    [facility]

## 2017-01-11 NOTE — Patient Instructions (Signed)
Medication Instructions:  Your physician recommends that you continue on your current medications as directed. Please refer to the Current Medication list given to you today.   Labwork: Your physician recommends that you return for lab work in: 1 week for (LFTS and fasting lipids)   Testing/Procedures: Your physician has requested that you have a lexiscan myoview. For further information please visit [email or web address]. Please follow instruction sheet, as given.  Your physician has requested that you have an echocardiogram. Echocardiography is a painless test that uses sound waves to create images of your heart. It provides your doctor with information about the size and shape of your heart and how well your heart's chambers and valves are working. This procedure takes approximately one hour. There are no restrictions for this procedure.    Follow-Up: Your physician wants you to follow-up in: 1 year with Dr. Radford Pax. You will receive a reminder letter in the mail two months in advance. If you don't receive a letter, please call our office to schedule the follow-up appointment.   Any Other Special Instructions Will Be Listed Below (If Applicable).     If you need a refill on your cardiac medications before your next appointment, please call your pharmacy.

## 2017-01-11 NOTE — Addendum Note (Signed)
Addended by: Teressa Senter on: 01/11/2017 03:19 PM   Modules accepted: Orders

## 2017-01-14 ENCOUNTER — Telehealth (HOSPITAL_COMMUNITY): Payer: Self-pay | Admitting: *Deleted

## 2017-01-14 NOTE — Telephone Encounter (Signed)
Patient given detailed instructions per Myocardial Perfusion Study Information Sheet for the test on 01/21/17. Patient notified to arrive 15 minutes early and that it is imperative to arrive on time for appointment to keep from having the test rescheduled.  If you need to cancel or reschedule your appointment, please call the office within 24 hours of your appointment. . Patient verbalized understanding. Kirstie Peri

## 2017-01-15 ENCOUNTER — Ambulatory Visit (INDEPENDENT_AMBULATORY_CARE_PROVIDER_SITE_OTHER): Payer: Medicare Other | Admitting: Family Medicine

## 2017-01-15 ENCOUNTER — Encounter: Payer: Self-pay | Admitting: *Deleted

## 2017-01-15 ENCOUNTER — Encounter: Payer: Self-pay | Admitting: Family Medicine

## 2017-01-15 VITALS — BP 120/78 | HR 66 | Temp 98.4°F | Ht 66.0 in | Wt 234.5 lb

## 2017-01-15 DIAGNOSIS — I482 Chronic atrial fibrillation: Secondary | ICD-10-CM

## 2017-01-15 DIAGNOSIS — G8929 Other chronic pain: Secondary | ICD-10-CM | POA: Diagnosis not present

## 2017-01-15 DIAGNOSIS — F411 Generalized anxiety disorder: Secondary | ICD-10-CM | POA: Diagnosis not present

## 2017-01-15 DIAGNOSIS — Z23 Encounter for immunization: Secondary | ICD-10-CM

## 2017-01-15 DIAGNOSIS — M545 Low back pain, unspecified: Secondary | ICD-10-CM

## 2017-01-15 DIAGNOSIS — I4821 Permanent atrial fibrillation: Secondary | ICD-10-CM

## 2017-01-15 DIAGNOSIS — R52 Pain, unspecified: Secondary | ICD-10-CM | POA: Diagnosis not present

## 2017-01-15 MED ORDER — DICLOFENAC SODIUM 2 % TD SOLN: 1.0000 "application " | Freq: Two times a day (BID) | TRANSDERMAL | 1 refills | Status: DC | PRN

## 2017-01-15 MED ORDER — ALPRAZOLAM 1 MG PO TABS: 1.0000 mg | ORAL_TABLET | Freq: Three times a day (TID) | ORAL | 2 refills | Status: DC | PRN

## 2017-01-15 MED ORDER — OXYCODONE HCL 5 MG PO TABS: 5.0000 mg | ORAL_TABLET | Freq: Four times a day (QID) | ORAL | 0 refills | Status: DC | PRN

## 2017-01-15 NOTE — Progress Notes (Signed)
Subjective:     Patient ID: [name], female   DOB: 03-18-1946, 71 y.o.   MRN: [id number]  HPI Patient here to discuss chronic anxiety medication.  Has been on Alprazolam for several years.  Also here to discuss pain medication. She takes very infrequent oxycodone 5 mg for severe breakthrough back pain. She is on chronic Coumadin and cannot take nonsteroidals. For flareups of back pain she has taken Tylenol without any relief. Her pain is intense at times with flareups. Usually 20 oxycodone lasts her several months.  She takes alprazolam and we had a long talk last visit about trying to taper back. She has multiple stressors as usual. No history of any recent falls.  Past Medical History:  Diagnosis Date  . ABDOMINAL PAIN RIGHT UPPER QUADRANT 11/08/2008  . ALLERGIC RHINITIS 08/01/2009  . Anticoagulated on Coumadin   . Anxiety   . CAD, NATIVE VESSEL 05/30/2009   moderate risk coronary calcium score by chest CT  . DDD (degenerative disc disease), lumbar   . DEPRESSION 06/13/2008  . DYSLIPIDEMIA 02/27/2009  . DYSPNEA 02/27/2009  . Frequency of urination   . GERD (gastroesophageal reflux disease)   . History of Bell's palsy   . History of DVT of lower extremity 2006-- CHRONIC COUMADIN THERAPY  . History of hiatal hernia   . History of Lyme disease   . 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 Mitral stenosis 11/13/2013   mild by echo  . NON-HODGKIN'S LYMPHOMA, HX OF dx  2005--  chemoradiation completed 2006--  no recurrence   onocologist- dr odogwu--   . OSTEOARTHRITIS, MODERATE 04/07/2010  . Permanent atrial fibrillation ([place])   . Retroperitoneal fibrosis    Past Surgical History:  Procedure Laterality Date  . BREAST MASS EXCISION     BENIGN-- RIGHT BREAST  . CARDIOVASCULAR STRESS TEST  04-04-2009-- PER PT ASYMPTOMATIC-- MEDICAL MANAGEMENT   STUDY WAS EQUIVOCAL/ EF 49%/ GLOBAL HYPOKINESIS/ APPEARS TO BE A MILD ANTERIOR PERFUSION DEFECT COULD  REPRESENT ISCHEMIA BUT DUE TO  ACTIVITY WORSE IN STRESS THAN AT REST POSS. THE DEFECT WAS ARTIFACTUAL  . CARDIOVERSION N/A 07/11/2015   Procedure: CARDIOVERSION;  Surgeon: Larey Dresser, MD;  Location: [facility];  Service: Cardiovascular;  Laterality: N/A;  . CT ANGIOGRAM  FEB 2011   CALCIUM SCORE 161/ POSS. MODERATE MID LAD STENOSIS  . CYSTOSCOPY W/ RETROGRADES  04/28/2011   Procedure: CYSTOSCOPY WITH RETROGRADE PYELOGRAM;  Surgeon: Fredricka Bonine, MD;  Location: [facility];  Service: Urology;  Laterality: Left;  . CYSTOSCOPY W/ URETERAL STENT REMOVAL  04/28/2011   Procedure: CYSTOSCOPY WITH STENT REMOVAL;  Surgeon: Fredricka Bonine, MD;  Location: [facility];  Service: Urology;;  . EXPLORATORY LAPAROTOMY  2005   LYMPHOMA  . KNEE ARTHROSCOPY  2005   RIGHT  . MULTIPLE CYSTO/ LEFT URETERAL STENT EXCHANGES  LAST ONE 10-28-10  . RETROPERITONEAL BX  2007  . REVERSE SHOULDER ARTHROPLASTY Right 01/31/2015   Procedure: RIGHT REVERSE SHOULDER ARTHROPLASTY;  Surgeon: Justice Britain, MD;  Location: [facility];  Service: Orthopedics;  Laterality: Right;  . TONSILLECTOMY  CHILD  . TOTAL KNEE ARTHROPLASTY  OCT 2010   RIGHT  . TOTAL KNEE ARTHROPLASTY  JAN 2010   LEFT  . TRANSTHORACIC ECHOCARDIOGRAM  [id number]   MILD SYSTOLIC DYSFUNCTION/ EF 29-47%/ MODERATE DIASTOLIC DYSFUCTION/ MILD MR/ MILD BIATRIAL ENLARGEMENT    reports that she quit smoking about 8 years ago. Her smoking use included Cigarettes. She has a  104.00 pack-year smoking history. She has never used smokeless tobacco. She reports that she drinks alcohol. She reports that she does not use drugs. family history includes Arthritis in her mother; Diabetes in her sister; Heart attack in her mother and sister; Heart disease in her mother; Hypertension in her sister; Stroke in her sister. Allergies  Allergen Reactions  . Chlorhexidine Base Itching    CHG WIPES  . Penicillins Hives and Rash      Review of Systems  Constitutional: Negative for fatigue.  Eyes: Negative for visual disturbance.  Respiratory: Negative for cough, chest tightness, shortness of breath and wheezing.   Cardiovascular: Negative for chest pain, palpitations and leg swelling.  Gastrointestinal: Negative for abdominal pain.  Musculoskeletal: Positive for arthralgias and back pain.  Neurological: Negative for dizziness, seizures, syncope, weakness, light-headedness and headaches.       Objective:   Physical Exam  Constitutional: She is oriented to person, place, and time. She appears well-developed and well-nourished.  Cardiovascular: Normal rate.   Pulmonary/Chest: Effort normal and breath sounds normal. No respiratory distress. She has no wheezes. She has no rales.  Musculoskeletal: She exhibits no edema.  Neurological: She is alert and oriented to person, place, and time.       Assessment:     #1 chronic intermittent back pain  #2 chronic anxiety  #3 history of atrial fibrillation on chronic Coumadin    Plan:     -We've again discussed potential risks of benzodiazepine use including increased for falls as well as possible cognitive impairment. She's been very reluctant to make any changes. -We've recommended against regular use of chronic opioids. She currently only takes 1 oxycodone for severe pain and we did agree to refill of #20 tablets. She knows to avoid nonsteroidals with her Coumadin  [name] MD [facility]

## 2017-01-16 DIAGNOSIS — G8929 Other chronic pain: Secondary | ICD-10-CM | POA: Insufficient documentation

## 2017-01-16 DIAGNOSIS — M545 Low back pain, unspecified: Secondary | ICD-10-CM | POA: Insufficient documentation

## 2017-01-18 ENCOUNTER — Telehealth: Payer: Self-pay | Admitting: Family Medicine

## 2017-01-18 ENCOUNTER — Other Ambulatory Visit: Payer: Self-pay | Admitting: Cardiology

## 2017-01-18 NOTE — Telephone Encounter (Signed)
PA approved, form faxed back to pharmacy. 

## 2017-01-18 NOTE — Telephone Encounter (Signed)
Received PA request for [place]. PA submitted via covermymeds. Key: [facility]

## 2017-01-21 ENCOUNTER — Other Ambulatory Visit: Payer: Medicare Other | Admitting: *Deleted

## 2017-01-21 ENCOUNTER — Ambulatory Visit (HOSPITAL_COMMUNITY): Payer: Medicare Other | Attending: Cardiovascular Disease

## 2017-01-21 ENCOUNTER — Ambulatory Visit (HOSPITAL_BASED_OUTPATIENT_CLINIC_OR_DEPARTMENT_OTHER): Payer: Medicare Other

## 2017-01-21 ENCOUNTER — Other Ambulatory Visit: Payer: Self-pay

## 2017-01-21 DIAGNOSIS — I050 Rheumatic mitral stenosis: Secondary | ICD-10-CM

## 2017-01-21 DIAGNOSIS — I110 Hypertensive heart disease with heart failure: Secondary | ICD-10-CM | POA: Insufficient documentation

## 2017-01-21 DIAGNOSIS — I4891 Unspecified atrial fibrillation: Secondary | ICD-10-CM | POA: Diagnosis not present

## 2017-01-21 DIAGNOSIS — I2510 Atherosclerotic heart disease of native coronary artery without angina pectoris: Secondary | ICD-10-CM | POA: Diagnosis not present

## 2017-01-21 DIAGNOSIS — E785 Hyperlipidemia, unspecified: Secondary | ICD-10-CM | POA: Diagnosis not present

## 2017-01-21 DIAGNOSIS — I4821 Permanent atrial fibrillation: Secondary | ICD-10-CM

## 2017-01-21 DIAGNOSIS — E7800 Pure hypercholesterolemia, unspecified: Secondary | ICD-10-CM | POA: Diagnosis not present

## 2017-01-21 DIAGNOSIS — E119 Type 2 diabetes mellitus without complications: Secondary | ICD-10-CM | POA: Diagnosis not present

## 2017-01-21 DIAGNOSIS — I420 Dilated cardiomyopathy: Secondary | ICD-10-CM

## 2017-01-21 DIAGNOSIS — D649 Anemia, unspecified: Secondary | ICD-10-CM | POA: Diagnosis not present

## 2017-01-21 DIAGNOSIS — I482 Chronic atrial fibrillation: Secondary | ICD-10-CM

## 2017-01-21 DIAGNOSIS — I509 Heart failure, unspecified: Secondary | ICD-10-CM | POA: Diagnosis not present

## 2017-01-21 DIAGNOSIS — I429 Cardiomyopathy, unspecified: Secondary | ICD-10-CM | POA: Diagnosis not present

## 2017-01-21 DIAGNOSIS — I5032 Chronic diastolic (congestive) heart failure: Secondary | ICD-10-CM | POA: Diagnosis not present

## 2017-01-21 DIAGNOSIS — R9439 Abnormal result of other cardiovascular function study: Secondary | ICD-10-CM | POA: Diagnosis not present

## 2017-01-21 DIAGNOSIS — Z87891 Personal history of nicotine dependence: Secondary | ICD-10-CM | POA: Insufficient documentation

## 2017-01-21 DIAGNOSIS — E669 Obesity, unspecified: Secondary | ICD-10-CM | POA: Insufficient documentation

## 2017-01-21 LAB — HEPATIC FUNCTION PANEL
ALT: 14 IU/L (ref 0–32)
AST: 29 IU/L (ref 0–40)
Albumin: 4 g/dL (ref 3.5–4.8)
Alkaline Phosphatase: 77 IU/L (ref 39–117)
BILIRUBIN TOTAL: 0.4 mg/dL (ref 0.0–1.2)
Bilirubin, Direct: 0.12 mg/dL (ref 0.00–0.40)
Total Protein: 6.5 g/dL (ref 6.0–8.5)

## 2017-01-21 LAB — LIPID PANEL
CHOLESTEROL TOTAL: 128 mg/dL (ref 100–199)
Chol/HDL Ratio: 2.3 ratio (ref 0.0–4.4)
HDL: 55 mg/dL (ref >39)
LDL Calculated: 55 mg/dL (ref 0–99)
TRIGLYCERIDES: 92 mg/dL (ref 0–149)
VLDL Cholesterol Cal: 18 mg/dL (ref 5–40)

## 2017-01-21 LAB — MYOCARDIAL PERFUSION IMAGING
CHL CUP NUCLEAR SRS: 9
CHL CUP RESTING HR STRESS: 60 {beats}/min
CSEPPHR: 65 {beats}/min
LV dias vol: 135 mL (ref 46–106)
LV sys vol: 73 mL
RATE: 0.28
SDS: 1
SSS: 10
TID: 1.05

## 2017-01-21 LAB — ECHOCARDIOGRAM COMPLETE
HEIGHTINCHES: 66 in
WEIGHTICAEL: 3696 [oz_av]

## 2017-01-21 MED ORDER — TECHNETIUM TC 99M TETROFOSMIN IV KIT
10.4000 | PACK | Freq: Once | INTRAVENOUS | Status: AC | PRN
  Administered 2017-01-21: 10.4 via INTRAVENOUS
  Filled 2017-01-21: qty 11

## 2017-01-21 MED ORDER — REGADENOSON 0.4 MG/5ML IV SOLN
0.4000 mg | Freq: Once | INTRAVENOUS | Status: AC
  Administered 2017-01-21: 0.4 mg via INTRAVENOUS

## 2017-01-21 MED ORDER — TECHNETIUM TC 99M TETROFOSMIN IV KIT
31.5000 | PACK | Freq: Once | INTRAVENOUS | Status: AC | PRN
  Administered 2017-01-21: 31.5 via INTRAVENOUS
  Filled 2017-01-21: qty 32

## 2017-01-22 ENCOUNTER — Telehealth: Payer: Self-pay

## 2017-01-22 NOTE — Telephone Encounter (Signed)
-----   Message from Sueanne Margarita, MD sent at 01/21/2017  5:26 PM EDT ----- Lipids at goal continue current therapy and forward to PCP

## 2017-01-22 NOTE — Telephone Encounter (Signed)
Left message to call back for lab results.

## 2017-01-22 NOTE — Telephone Encounter (Signed)
New Message ° ° pt verbalized that she is returning call for rn °

## 2017-01-26 NOTE — Telephone Encounter (Signed)
New Message ° ° pt verbalized that she is returning call for rn °

## 2017-01-26 NOTE — Telephone Encounter (Signed)
Notes recorded by Sueanne Margarita, MD on 01/23/2017 at 4:51 PM EDT No ischemia and low normal LVF by echo with no wall motion abnormality therefore overall low risk study    Pt made aware of stress test results per Dr Radford Pax.  Pt verbalized understanding.

## 2017-02-15 ENCOUNTER — Telehealth: Payer: Self-pay | Admitting: Family Medicine

## 2017-02-15 NOTE — Telephone Encounter (Signed)
Copied from [place] [id number]. Topic: Referral - Request >> Feb 15, 2017  3:00 PM Lennox Solders wrote: Reason for CRM:Pt would like a referral to a foot doctor. Pt has something on her left foot in between her toes. Pt decline to see md. Pt does not want to go to [facility]

## 2017-02-15 NOTE — Telephone Encounter (Signed)
Would suggest Dr Caffie Pinto.

## 2017-02-16 NOTE — Telephone Encounter (Signed)
Left message on machine for patient to return our call 

## 2017-03-18 ENCOUNTER — Other Ambulatory Visit: Payer: Self-pay | Admitting: *Deleted

## 2017-03-18 MED ORDER — DULOXETINE HCL 60 MG PO CPEP: 60.0000 mg | ORAL_CAPSULE | Freq: Every day | ORAL | 0 refills | Status: DC

## 2017-03-18 NOTE — Telephone Encounter (Signed)
Rx done. 

## 2017-04-16 ENCOUNTER — Encounter (INDEPENDENT_AMBULATORY_CARE_PROVIDER_SITE_OTHER): Payer: Self-pay

## 2017-04-16 ENCOUNTER — Ambulatory Visit: Payer: Medicare Other | Admitting: Family Medicine

## 2017-04-16 ENCOUNTER — Ambulatory Visit: Payer: Medicare Other

## 2017-04-16 ENCOUNTER — Ambulatory Visit (INDEPENDENT_AMBULATORY_CARE_PROVIDER_SITE_OTHER): Payer: Medicare Other | Admitting: Family Medicine

## 2017-04-16 ENCOUNTER — Encounter: Payer: Self-pay | Admitting: Family Medicine

## 2017-04-16 ENCOUNTER — Ambulatory Visit: Payer: Medicare Other | Admitting: *Deleted

## 2017-04-16 VITALS — BP 126/80 | HR 85 | Ht 66.0 in | Wt 229.0 lb

## 2017-04-16 DIAGNOSIS — F411 Generalized anxiety disorder: Secondary | ICD-10-CM

## 2017-04-16 DIAGNOSIS — Z5181 Encounter for therapeutic drug level monitoring: Secondary | ICD-10-CM

## 2017-04-16 DIAGNOSIS — Z0000 Encounter for general adult medical examination without abnormal findings: Secondary | ICD-10-CM | POA: Diagnosis not present

## 2017-04-16 DIAGNOSIS — Z7901 Long term (current) use of anticoagulants: Secondary | ICD-10-CM | POA: Diagnosis not present

## 2017-04-16 DIAGNOSIS — I824Y9 Acute embolism and thrombosis of unspecified deep veins of unspecified proximal lower extremity: Secondary | ICD-10-CM

## 2017-04-16 LAB — POCT INR: INR: 2.8

## 2017-04-16 MED ORDER — ALPRAZOLAM 1 MG PO TABS: 1.0000 mg | ORAL_TABLET | Freq: Three times a day (TID) | ORAL | 2 refills | Status: DC | PRN

## 2017-04-16 MED ORDER — VALACYCLOVIR HCL 1 G PO TABS: ORAL_TABLET | ORAL | 1 refills | Status: DC

## 2017-04-16 NOTE — Progress Notes (Signed)
Subjective:   Kenslie Abbruzzese is a 72 y.o. female who presents for Medicare Annual (Subsequent) preventive examination.  Married 60 years  has 3 boys [place] children 6 4 in [place] and other are in [place] them often   No functional from this year to last year Not as strong but overall managing         Objective:     Vitals: BP 126/80   Pulse 85   Ht 5\' 6"  (1.676 m)   Wt 229 lb (103.9 kg)   SpO2 95%   BMI 36.96 kg/m   Body mass index is 36.96 kg/m.  Advanced Directives 04/16/2017 07/11/2015 02/03/2015 01/25/2015 04/22/2011  Does Patient Have a Medical Advance Directive? Yes Yes No Yes Patient has advance directive, copy not in chart  Type of Advance Directive - Healthcare Power of Attorney Living will Living will -  [place] in Chart? - No - copy requested No - copy requested No - copy requested -  Would patient like information on creating a medical advance directive? - - No - patient declined information - -    Tobacco Social History   Tobacco Use  Smoking Status Former Smoker  . Packs/day: 2.00  . Years: 52.00  . Pack years: 104.00  . Types: Cigarettes  . Last attempt to quit: 03/24/2008  . Years since quitting: 9.0  Smokeless Tobacco Never Used     Counseling given: Not Answered   Clinical Intake:     Past Medical History:  Diagnosis Date  . ABDOMINAL PAIN RIGHT UPPER QUADRANT 11/08/2008  . ALLERGIC RHINITIS 08/01/2009  . Anticoagulated on Coumadin   . Anxiety   . CAD, NATIVE VESSEL 05/30/2009   moderate risk coronary calcium score by chest CT  . DDD (degenerative disc disease), lumbar   . DEPRESSION 06/13/2008  . DYSLIPIDEMIA 02/27/2009  . DYSPNEA 02/27/2009  . Frequency of urination   . GERD (gastroesophageal reflux disease)   . History of Bell's palsy   . History of DVT of lower extremity 2006-- CHRONIC COUMADIN THERAPY  . History of hiatal hernia   . History of Lyme disease   . Hydronephrosis, left chronic --  secondary to retroperitoneal fibrosis  . Mitral stenosis 11/13/2013   mild by echo  . NON-HODGKIN'S LYMPHOMA, HX OF dx  2005--  chemoradiation completed 2006--  no recurrence   onocologist- dr odogwu--   . OSTEOARTHRITIS, MODERATE 04/07/2010  . Permanent atrial fibrillation ([place])   . Retroperitoneal fibrosis    Past Surgical History:  Procedure Laterality Date  . BREAST MASS EXCISION     BENIGN-- RIGHT BREAST  . CARDIOVASCULAR STRESS TEST  04-04-2009-- PER PT ASYMPTOMATIC-- MEDICAL MANAGEMENT   STUDY WAS EQUIVOCAL/ EF 49%/ GLOBAL HYPOKINESIS/ APPEARS TO BE A MILD ANTERIOR PERFUSION DEFECT COULD REPRESENT ISCHEMIA BUT DUE TO  ACTIVITY WORSE IN STRESS THAN AT REST POSS. THE DEFECT WAS ARTIFACTUAL  . CARDIOVERSION N/A 07/11/2015   Procedure: CARDIOVERSION;  Surgeon: Larey Dresser, MD;  Location: [facility];  Service: Cardiovascular;  Laterality: N/A;  . CT ANGIOGRAM  FEB 2011   CALCIUM SCORE 161/ POSS. MODERATE MID LAD STENOSIS  . CYSTOSCOPY W/ RETROGRADES  04/28/2011   Procedure: CYSTOSCOPY WITH RETROGRADE PYELOGRAM;  Surgeon: Fredricka Bonine, MD;  Location: [facility];  Service: Urology;  Laterality: Left;  . CYSTOSCOPY W/ URETERAL STENT REMOVAL  04/28/2011   Procedure: CYSTOSCOPY WITH STENT REMOVAL;  Surgeon: Fredricka Bonine, MD;  Location: [facility];  Service: Urology;;  . EXPLORATORY LAPAROTOMY  2005   LYMPHOMA  . KNEE ARTHROSCOPY  2005   RIGHT  . MULTIPLE CYSTO/ LEFT URETERAL STENT EXCHANGES  LAST ONE 10-28-10  . RETROPERITONEAL BX  2007  . REVERSE SHOULDER ARTHROPLASTY Right 01/31/2015   Procedure: RIGHT REVERSE SHOULDER ARTHROPLASTY;  Surgeon: Justice Britain, MD;  Location: [facility];  Service: Orthopedics;  Laterality: Right;  . TONSILLECTOMY  CHILD  . TOTAL KNEE ARTHROPLASTY  OCT 2010   RIGHT  . TOTAL KNEE ARTHROPLASTY  JAN 2010   LEFT  . TRANSTHORACIC ECHOCARDIOGRAM  [id number]   MILD SYSTOLIC DYSFUNCTION/ EF 25-36%/ MODERATE  DIASTOLIC DYSFUCTION/ MILD MR/ MILD BIATRIAL ENLARGEMENT   Family History  Problem Relation Age of Onset  . Arthritis Mother   . Heart disease Mother   . Heart attack Mother   . Diabetes Sister   . Hypertension Sister   . Stroke Sister   . Heart attack Sister    Social History   Socioeconomic History  . Marital status: Married    Spouse name: Not on file  . Number of children: Not on file  . Years of education: Not on file  . Highest education level: Not on file  Social Needs  . Financial resource strain: Not on file  . Food insecurity - worry: Not on file  . Food insecurity - inability: Not on file  . Transportation needs - medical: Not on file  . Transportation needs - non-medical: Not on file  Occupational History  . Not on file  Tobacco Use  . Smoking status: Former Smoker    Packs/day: 2.00    Years: 52.00    Pack years: 104.00    Types: Cigarettes    Last attempt to quit: 03/24/2008    Years since quitting: 9.0  . Smokeless tobacco: Never Used  Substance and Sexual Activity  . Alcohol use: Yes    Comment: RARE  . Drug use: No  . Sexual activity: Not on file  Other Topics Concern  . Not on file  Social History Narrative  . Not on file    Outpatient Encounter Medications as of 04/16/2017  Medication Sig  . ALPRAZolam (XANAX) 1 MG tablet Take 1 tablet (1 mg total) by mouth 3 (three) times daily as needed for anxiety.  Marland Kitchen b complex vitamins capsule Take 2 capsules by mouth daily.   . Biotin 5 MG CAPS Take by mouth.  . Cholecalciferol (VITAMIN D) 2000 UNITS CAPS Take 2,000 Int'l Units by mouth.  . Coenzyme Q10-Fish Oil-Vit E (CO-Q 10 OMEGA-3 FISH OIL PO) Take 100 mg elemental calcium/kg/hr by mouth daily.  Marland Kitchen diltiazem (CARDIZEM CD) 180 MG 24 hr capsule Take 1 capsule (180 mg total) by mouth 2 (two) times daily.  . DULoxetine (CYMBALTA) 60 MG capsule Take 1 capsule (60 mg total) by mouth daily.  . fluticasone (FLONASE) 50 MCG/ACT nasal spray instill 2 sprays into  each nostril once daily (Patient taking differently: instill 2 sprays into each nostril once daily as needed)  . furosemide (LASIX) 20 MG tablet Take 1 tablet (20 mg total) by mouth daily. Please keep upcoming appointment for further refills  . loratadine (CLARITIN) 10 MG tablet Take 10 mg by mouth daily.   Marland Kitchen losartan (COZAAR) 25 MG tablet TAKE 1 TABLET BY MOUTH DAILY  . Lysine 500 MG TABS Take 500 mg by mouth daily.  . metoprolol succinate (TOPROL-XL) 25 MG 24 hr tablet Take 1 tablet (25 mg total)  by mouth daily.  . nitroGLYCERIN (NITROSTAT) 0.4 MG SL tablet place 1 tablet under the tongue every 5 minutes for UP TO 3 doses if needed  . Pitavastatin Calcium (LIVALO) 2 MG TABS Take 1 tablet (2 mg total) by mouth daily.  . valACYclovir (VALTREX) 1000 MG tablet Take 2 tablets at onset of cold sores and repeat 2 in 12 hours.  Marland Kitchen warfarin (COUMADIN) 5 MG tablet take 1 tablet by mouth once daily except 1/2 tablet on Sun and Tues OR AS DIRECTED  . buPROPion (WELLBUTRIN XL) 300 MG 24 hr tablet take 1 tablet by mouth once daily (Patient not taking: Reported on 04/16/2017)  . Diclofenac Sodium (PENNSAID) 2 % SOLN Place 1 application onto the skin 2 (two) times daily as needed. (Patient not taking: Reported on 04/16/2017)  . oxyCODONE (OXY IR/ROXICODONE) 5 MG immediate release tablet Take 1 tablet (5 mg total) by mouth every 6 (six) hours as needed for severe pain. (Patient not taking: Reported on 04/16/2017)  . [DISCONTINUED] amiodarone (PACERONE) 200 MG tablet Take 1 tablet (200 mg total) by mouth daily.   No facility-administered encounter medications on file as of 04/16/2017.     Activities of Daily Living In your present state of health, do you have any difficulty performing the following activities: 04/16/2017  Hearing? N  Vision? N  Difficulty concentrating or making decisions? N  Walking or climbing stairs? N  Dressing or bathing? N  Doing errands, shopping? N  Preparing Food and eating ? N  Using  the Toilet? N  In the past six months, have you accidently leaked urine? N  Do you have problems with loss of bowel control? N  Managing your Medications? N  Managing your Finances? N  Housekeeping or managing your Housekeeping? N  Some recent data might be hidden    Patient Care Team: [name], MD as PCP - General    Assessment:   This is a routine wellness examination for [place].  Exercise Activities and Dietary recommendations    Goals    . Patient Stated     Will remain healthy Continue to be able to live independently        Fall Risk Fall Risk  04/16/2017 10/14/2016 05/31/2015 04/23/2014 11/30/2012  Falls in the past year? Yes Yes Yes No No  Comment up the steps and slipper felt  - - - -  Number falls in past yr: 1 2 or more 2 or more - -  Injury with Fall? - Yes - - -  Follow up Education provided - - - -     Depression Screen PHQ 2/9 Scores 04/16/2017 05/31/2015 04/23/2014 11/30/2012  PHQ - 2 Score 0 0 0 1     Cognitive Function     Ad8 score reviewed for issues:  Issues making decisions:  Less interest in hobbies / activities:  Repeats questions, stories (family complaining):  Trouble using ordinary gadgets (microwave, computer, phone):  Forgets the month or year:   Mismanaging finances:   Remembering appts:  Daily problems with thinking and/or memory: Ad8 score is=0 Recall ball, tree, car  lack of focus; serial 7 x 2 Can state the months dec to jan backwards perfectly Remembers 2 of 3 No failures of independent living      Immunization History  Administered Date(s) Administered  . Influenza Split 01/08/2011, 11/30/2011, 04/23/2014  . Influenza Whole 04/07/2010  . Influenza, High Dose Seasonal PF 01/15/2017  . Influenza,inj,Quad PF,6+ Mos 11/30/2012, 04/23/2014  .  Influenza-Unspecified 04/04/2015  . Pneumococcal Conjugate-13 05/31/2015  . Pneumococcal Polysaccharide-23 01/08/2011  . Tdap 05/22/2012  . Zoster Recombinat  (Shingrix) 09/29/2016     Screening Tests Health Maintenance  Topic Date Due  . COLONOSCOPY  11/03/2016  . MAMMOGRAM  05/31/2035 (Originally 10/03/2009)  . TETANUS/TDAP  05/23/2022  . INFLUENZA VACCINE  Completed  . DEXA SCAN  Completed  . Hepatitis C Screening  Completed  . PNA vac Low Risk Adult  Completed        Plan:      PCP Notes   Health Maintenance Health Maintenance Due  Topic Date Due  . COLONOSCOPY  11/03/2016   No mammogram declines Bone density - states she has degenerative disc disease and this makes it work. Never has sustained a fx  Colonoscopy due States she will fup with GI  Educated regarding LDCT  Will think about this - will decline for today  Had one dose of the shingrix and not sure if she had 2  Abnormal Screens  BMI 37 - comfortable with her weight   Referrals  none  Patient concerns; Wants to get her medicines straight  Nurse Concerns; Stated coumadin was checked today and states it was good   Next PCP apt To be scheduled   I have personally reviewed and noted the following in the patient's chart:   . Medical and social history . Use of alcohol, tobacco or illicit drugs  . Current medications and supplements . Functional ability and status . Nutritional status . Physical activity . Advanced directives . List of other physicians . Hospitalizations, surgeries, and ER visits in previous 12 months . Vitals . Screenings to include cognitive, depression, and falls . Referrals and appointments  In addition, I have reviewed and discussed with patient certain preventive protocols, quality metrics, and best practice recommendations. A written personalized care plan for preventive services as well as general preventive health recommendations were provided to patient.     SELTR,VUYEB, RN  04/16/2017  Agree with assessment as above.  [name] MD [facility] at [facility]

## 2017-04-16 NOTE — Patient Instructions (Signed)
Description   Continue taking same dosage 1 tablet (5mg) every day except 1/2 tablet (2.5mg) on Sundays and Tuesdays.  Recheck INR in 4 weeks.       

## 2017-04-16 NOTE — Progress Notes (Signed)
Subjective:     Patient ID: [name], female   DOB: Sep 23, 1945, 72 y.o.   MRN: [id number]  HPI Patient is here for Medicare wellness visit and also for medical follow-up. She has multiple chronic problems history of CAD, history of DVT, hypertension, diastolic heart failure, osteoarthritis, chronic anxiety, obesity, history of non-Hodgkin lymphoma.  She is requesting refills of alprazolam. She's been on this for many years. We have talked previously about trying to scale back. She is currently taking Cymbalta which seems to working well for her depression. Patient denies any alcohol use. No recent falls.  Past Medical History:  Diagnosis Date  . ABDOMINAL PAIN RIGHT UPPER QUADRANT 11/08/2008  . ALLERGIC RHINITIS 08/01/2009  . Anticoagulated on Coumadin   . Anxiety   . CAD, NATIVE VESSEL 05/30/2009   moderate risk coronary calcium score by chest CT  . DDD (degenerative disc disease), lumbar   . DEPRESSION 06/13/2008  . DYSLIPIDEMIA 02/27/2009  . DYSPNEA 02/27/2009  . Frequency of urination   . GERD (gastroesophageal reflux disease)   . History of Bell's palsy   . History of DVT of lower extremity 2006-- CHRONIC COUMADIN THERAPY  . History of hiatal hernia   . History of Lyme disease   . 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 Mitral stenosis 11/13/2013   mild by echo  . NON-HODGKIN'S LYMPHOMA, HX OF dx  2005--  chemoradiation completed 2006--  no recurrence   onocologist- dr odogwu--   . OSTEOARTHRITIS, MODERATE 04/07/2010  . Permanent atrial fibrillation ([place])   . Retroperitoneal fibrosis    Past Surgical History:  Procedure Laterality Date  . BREAST MASS EXCISION     BENIGN-- RIGHT BREAST  . CARDIOVASCULAR STRESS TEST  04-04-2009-- PER PT ASYMPTOMATIC-- MEDICAL MANAGEMENT   STUDY WAS EQUIVOCAL/ EF 49%/ GLOBAL HYPOKINESIS/ APPEARS TO BE A MILD ANTERIOR PERFUSION DEFECT COULD REPRESENT ISCHEMIA BUT DUE TO  ACTIVITY WORSE IN STRESS THAN AT  REST POSS. THE DEFECT WAS ARTIFACTUAL  . CARDIOVERSION N/A 07/11/2015   Procedure: CARDIOVERSION;  Surgeon: Larey Dresser, MD;  Location: [facility];  Service: Cardiovascular;  Laterality: N/A;  . CT ANGIOGRAM  FEB 2011   CALCIUM SCORE 161/ POSS. MODERATE MID LAD STENOSIS  . CYSTOSCOPY W/ RETROGRADES  04/28/2011   Procedure: CYSTOSCOPY WITH RETROGRADE PYELOGRAM;  Surgeon: Fredricka Bonine, MD;  Location: [facility];  Service: Urology;  Laterality: Left;  . CYSTOSCOPY W/ URETERAL STENT REMOVAL  04/28/2011   Procedure: CYSTOSCOPY WITH STENT REMOVAL;  Surgeon: Fredricka Bonine, MD;  [facility];  Service: Urology;;  . EXPLORATORY LAPAROTOMY  2005   LYMPHOMA  . KNEE ARTHROSCOPY  2005   RIGHT  . MULTIPLE CYSTO/ LEFT URETERAL STENT EXCHANGES  LAST ONE 10-28-10  . RETROPERITONEAL BX  2007  . REVERSE SHOULDER ARTHROPLASTY Right 01/31/2015   Procedure: RIGHT REVERSE SHOULDER ARTHROPLASTY;  Surgeon: Justice Britain, MD;  Location: [place];  Service: Orthopedics;  Laterality: Right;  . TONSILLECTOMY  CHILD  . TOTAL KNEE ARTHROPLASTY  OCT 2010   RIGHT  . TOTAL KNEE ARTHROPLASTY  JAN 2010   LEFT  . TRANSTHORACIC ECHOCARDIOGRAM  [id number]   MILD SYSTOLIC DYSFUNCTION/ EF 96-29%/ MODERATE DIASTOLIC DYSFUCTION/ MILD MR/ MILD BIATRIAL ENLARGEMENT    reports that she quit smoking about 9 years ago. Her smoking use included cigarettes. She has a 104.00 pack-year smoking history. she has never used smokeless tobacco. She reports that she drinks alcohol. She reports that she  does not use drugs. family history includes Arthritis in her mother; Diabetes in her sister; Heart attack in her mother and sister; Heart disease in her mother; Hypertension in her sister; Stroke in her sister. Allergies  Allergen Reactions  . Chlorhexidine Base Itching    CHG WIPES  . Penicillins Hives and Rash     Review of Systems  Constitutional: Negative for fatigue.  Eyes:  Negative for visual disturbance.  Respiratory: Negative for cough, chest tightness, shortness of breath and wheezing.   Cardiovascular: Negative for chest pain, palpitations and leg swelling.  Endocrine: Negative for polydipsia and polyuria.  Neurological: Negative for dizziness, seizures, syncope, weakness, light-headedness and headaches.       Objective:   Physical Exam  Constitutional: She appears well-developed and well-nourished.  Eyes: Pupils are equal, round, and reactive to light.  Neck: Neck supple. No JVD present. No thyromegaly present.  Cardiovascular: Normal rate.  Pulmonary/Chest: Effort normal and breath sounds normal. No respiratory distress. She has no wheezes. She has no rales.  Musculoskeletal: She exhibits no edema.  Neurological: She is alert.       Assessment:     Chronic anxiety.    Plan:     -We discussed again our concerns regarding benzodiazepine use- especially at her age with increased risk of falls and also potential for increased risk of cognitive decline.  She is very reluctant to scale back. She states her son attempted suicide twice recently and this has been very stressful for her. -Refilled alprazolam for 3 months.  [name] MD [facility] at [facility]

## 2017-04-16 NOTE — Progress Notes (Deleted)
Subjective:   Casaundra Takacs is a 72 y.o. female who presents for Medicare Annual (Subsequent) preventive examination.  Reports health as Cardioversion 06/2015 OV today with Dr. Elease Hashimoto    Diet Lipids chol/ldl 2.3 BS elevated; A1c 6.7 (to discuss)   Exercise  Tobacco; 104 pack years; quit 2010 Discuss LDCT program   Health Maintenance Due  Topic Date Due  . COLONOSCOPY  11/03/2016  last was 2008  Mammogram 09/2007 dexa 03/2015 -1.4  Discuss repeat dexa and mammogram      Objective:     Vitals: There were no vitals taken for this visit.  There is no height or weight on file to calculate BMI.  Advanced Directives 07/11/2015 02/03/2015 01/25/2015 04/22/2011  Does Patient Have a Medical Advance Directive? Yes No Yes Patient has advance directive, copy not in chart  Type of Advance Directive Healthcare Power of Attorney Living will Living will -  [place] in Chart? No - copy requested No - copy requested No - copy requested -  Would patient like information on creating a medical advance directive? - No - patient declined information - -    Tobacco Social History   Tobacco Use  Smoking Status Former Smoker  . Packs/day: 2.00  . Years: 52.00  . Pack years: 104.00  . Types: Cigarettes  . Last attempt to quit: 03/24/2008  . Years since quitting: 9.0  Smokeless Tobacco Never Used     Counseling given: Not Answered   Clinical Intake:                       Past Medical History:  Diagnosis Date  . ABDOMINAL PAIN RIGHT UPPER QUADRANT 11/08/2008  . ALLERGIC RHINITIS 08/01/2009  . Anticoagulated on Coumadin   . Anxiety   . CAD, NATIVE VESSEL 05/30/2009   moderate risk coronary calcium score by chest CT  . DDD (degenerative disc disease), lumbar   . DEPRESSION 06/13/2008  . DYSLIPIDEMIA 02/27/2009  . DYSPNEA 02/27/2009  . Frequency of urination   . GERD (gastroesophageal reflux disease)   . History of Bell's palsy   .  History of DVT of lower extremity 2006-- CHRONIC COUMADIN THERAPY  . History of hiatal hernia   . History of Lyme disease   . 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 Mitral stenosis 11/13/2013   mild by echo  . NON-HODGKIN'S LYMPHOMA, HX OF dx  2005--  chemoradiation completed 2006--  no recurrence   onocologist- dr odogwu--   . OSTEOARTHRITIS, MODERATE 04/07/2010  . Permanent atrial fibrillation ([place])   . Retroperitoneal fibrosis    Past Surgical History:  Procedure Laterality Date  . BREAST MASS EXCISION     BENIGN-- RIGHT BREAST  . CARDIOVASCULAR STRESS TEST  04-04-2009-- PER PT ASYMPTOMATIC-- MEDICAL MANAGEMENT   STUDY WAS EQUIVOCAL/ EF 49%/ GLOBAL HYPOKINESIS/ APPEARS TO BE A MILD ANTERIOR PERFUSION DEFECT COULD REPRESENT ISCHEMIA BUT DUE TO  ACTIVITY WORSE IN STRESS THAN AT REST POSS. THE DEFECT WAS ARTIFACTUAL  . CARDIOVERSION N/A 07/11/2015   Procedure: CARDIOVERSION;  Surgeon: Larey Dresser, MD;  Location: [facility];  Service: Cardiovascular;  Laterality: N/A;  . CT ANGIOGRAM  FEB 2011   CALCIUM SCORE 161/ POSS. MODERATE MID LAD STENOSIS  . CYSTOSCOPY W/ RETROGRADES  04/28/2011   Procedure: CYSTOSCOPY WITH RETROGRADE PYELOGRAM;  Surgeon: Fredricka Bonine, MD;  Location: [facility];  Service: Urology;  Laterality: Left;  . CYSTOSCOPY W/  URETERAL STENT REMOVAL  04/28/2011   Procedure: CYSTOSCOPY WITH STENT REMOVAL;  Surgeon: Fredricka Bonine, MD;  Location: [facility];  Service: Urology;;  . EXPLORATORY LAPAROTOMY  2005   LYMPHOMA  . KNEE ARTHROSCOPY  2005   RIGHT  . MULTIPLE CYSTO/ LEFT URETERAL STENT EXCHANGES  LAST ONE 10-28-10  . RETROPERITONEAL BX  2007  . REVERSE SHOULDER ARTHROPLASTY Right 01/31/2015   Procedure: RIGHT REVERSE SHOULDER ARTHROPLASTY;  Surgeon: Justice Britain, MD;  Location: [facility];  Service: Orthopedics;  Laterality: Right;  . TONSILLECTOMY  CHILD  . TOTAL KNEE ARTHROPLASTY  OCT  2010   RIGHT  . TOTAL KNEE ARTHROPLASTY  JAN 2010   LEFT  . TRANSTHORACIC ECHOCARDIOGRAM  [id number]   MILD SYSTOLIC DYSFUNCTION/ EF 03-47%/ MODERATE DIASTOLIC DYSFUCTION/ MILD MR/ MILD BIATRIAL ENLARGEMENT   Family History  Problem Relation Age of Onset  . Arthritis Mother   . Heart disease Mother   . Heart attack Mother   . Diabetes Sister   . Hypertension Sister   . Stroke Sister   . Heart attack Sister    Social History   Socioeconomic History  . Marital status: Married    Spouse name: Not on file  . Number of children: Not on file  . Years of education: Not on file  . Highest education level: Not on file  Social Needs  . Financial resource strain: Not on file  . Food insecurity - worry: Not on file  . Food insecurity - inability: Not on file  . Transportation needs - medical: Not on file  . Transportation needs - non-medical: Not on file  Occupational History  . Not on file  Tobacco Use  . Smoking status: Former Smoker    Packs/day: 2.00    Years: 52.00    Pack years: 104.00    Types: Cigarettes    Last attempt to quit: 03/24/2008    Years since quitting: 9.0  . Smokeless tobacco: Never Used  Substance and Sexual Activity  . Alcohol use: Yes    Comment: RARE  . Drug use: No  . Sexual activity: Not on file  Other Topics Concern  . Not on file  Social History Narrative  . Not on file    Outpatient Encounter Medications as of 04/16/2017  Medication Sig  . ALPRAZolam (XANAX) 1 MG tablet Take 1 tablet (1 mg total) by mouth 3 (three) times daily as needed for anxiety.  Marland Kitchen b complex vitamins capsule Take 2 capsules by mouth daily.   . Biotin 5 MG CAPS Take by mouth.  Marland Kitchen buPROPion (WELLBUTRIN XL) 300 MG 24 hr tablet take 1 tablet by mouth once daily  . Cholecalciferol (VITAMIN D) 2000 UNITS CAPS Take 2,000 Int'l Units by mouth.  . Coenzyme Q10-Fish Oil-Vit E (CO-Q 10 OMEGA-3 FISH OIL PO) Take 100 mg elemental calcium/kg/hr by mouth daily.  . Diclofenac Sodium  (PENNSAID) 2 % SOLN Place 1 application onto the skin 2 (two) times daily as needed.  . diltiazem (CARDIZEM CD) 180 MG 24 hr capsule Take 1 capsule (180 mg total) by mouth 2 (two) times daily.  . DULoxetine (CYMBALTA) 60 MG capsule Take 1 capsule (60 mg total) by mouth daily.  . fluticasone (FLONASE) 50 MCG/ACT nasal spray instill 2 sprays into each nostril once daily (Patient taking differently: instill 2 sprays into each nostril once daily as needed)  . furosemide (LASIX) 20 MG tablet Take 1 tablet (20 mg total) by mouth daily. Please keep upcoming  appointment for further refills  . loratadine (CLARITIN) 10 MG tablet Take 10 mg by mouth daily.   Marland Kitchen losartan (COZAAR) 25 MG tablet TAKE 1 TABLET BY MOUTH DAILY  . Lysine 500 MG TABS Take 500 mg by mouth daily.  . metoprolol succinate (TOPROL-XL) 25 MG 24 hr tablet Take 1 tablet (25 mg total) by mouth daily.  . nitroGLYCERIN (NITROSTAT) 0.4 MG SL tablet place 1 tablet under the tongue every 5 minutes for UP TO 3 doses if needed  . oxyCODONE (OXY IR/ROXICODONE) 5 MG immediate release tablet Take 1 tablet (5 mg total) by mouth every 6 (six) hours as needed for severe pain.  . Pitavastatin Calcium (LIVALO) 2 MG TABS Take 1 tablet (2 mg total) by mouth daily.  . valACYclovir (VALTREX) 1000 MG tablet Take 2 tablets at onset of cold sores and repeat 2 in 12 hours.  Marland Kitchen warfarin (COUMADIN) 5 MG tablet take 1 tablet by mouth once daily except 1/2 tablet on Sun and Tues OR AS DIRECTED  . [DISCONTINUED] amiodarone (PACERONE) 200 MG tablet Take 1 tablet (200 mg total) by mouth daily.   No facility-administered encounter medications on file as of 04/16/2017.     Activities of Daily Living No flowsheet data found.  Patient Care Team: [name], MD as PCP - General    Assessment:   This is a routine wellness examination for [place].  Exercise Activities and Dietary recommendations    Goals    None      Fall Risk Fall Risk  10/14/2016  05/31/2015 04/23/2014 11/30/2012  Falls in the past year? Yes Yes No No  Number falls in past yr: 2 or more 2 or more - -  Injury with Fall? Yes - - -   Is the patient's home free of loose throw rugs in walkways, pet beds, electrical cords, etc?   {Blank single:19197::"yes","no"}      Grab bars in the bathroom? {Blank single:19197::"yes","no"}      Handrails on the stairs?   {Blank single:19197::"yes","no"}      Adequate lighting?   {Blank single:19197::"yes","no"}  Timed Get Up and Go performed: ***  Depression Screen PHQ 2/9 Scores 05/31/2015 04/23/2014 11/30/2012  PHQ - 2 Score 0 0 1     Cognitive Function        Immunization History  Administered Date(s) Administered  . Influenza Split 01/08/2011, 11/30/2011, 04/23/2014  . Influenza Whole 04/07/2010  . Influenza, High Dose Seasonal PF 01/15/2017  . Influenza,inj,Quad PF,6+ Mos 11/30/2012, 04/23/2014  . Influenza-Unspecified 04/04/2015  . Pneumococcal Conjugate-13 05/31/2015  . Pneumococcal Polysaccharide-23 01/08/2011  . Tdap 05/22/2012  . Zoster Recombinat (Shingrix) 09/29/2016    Qualifies for Shingles Vaccine?***  Screening Tests Health Maintenance  Topic Date Due  . COLONOSCOPY  11/03/2016  . MAMMOGRAM  05/31/2035 (Originally 10/03/2009)  . TETANUS/TDAP  05/23/2022  . INFLUENZA VACCINE  Completed  . DEXA SCAN  Completed  . Hepatitis C Screening  Completed  . PNA vac Low Risk Adult  Completed    Cancer Screenings: Lung: Low Dose CT Chest recommended if Age 49-80 years, 30 pack-year currently smoking OR have quit w/in 15years. Patient {DOES NOT does:27190::"does not"} qualify. Breast:  Up to date on Mammogram? {Yes/No:30480221}   Up to date of Bone Density/Dexa? {Yes/No:30480221} Colorectal: ***  Additional Screenings: *** Hepatitis B/HIV/Syphillis: Hepatitis C Screening:      Plan:   ***   I have personally reviewed and noted the following in the patient's chart:   .  Medical and social history . Use of  alcohol, tobacco or illicit drugs  . Current medications and supplements . Functional ability and status . Nutritional status . Physical activity . Advanced directives . List of other physicians . Hospitalizations, surgeries, and ER visits in previous 12 months . Vitals . Screenings to include cognitive, depression, and falls . Referrals and appointments  In addition, I have reviewed and discussed with patient certain preventive protocols, quality metrics, and best practice recommendations. A written personalized care plan for preventive services as well as general preventive health recommendations were provided to patient.     Wynetta Fines, RN  04/16/2017

## 2017-04-16 NOTE — Patient Instructions (Addendum)
[name] , Thank you for taking time to come for your Medicare Wellness Visit. I appreciate your ongoing commitment to your health goals. Please review the following plan we discussed and let me know if I can assist you in the future.   Please check with the pharmacy to see if you had your 2nd shingrix vaccine Shingrix is a vaccine for the prevention of Shingles in Adults 50 and older.  If you are on Medicare, you can request a prescription from your doctor to be filled at a pharmacy.  Please check with your benefits regarding applicable copays or out of pocket expenses.  The Shingrix is given in 2 vaccines approx 8 weeks apart. You must receive the 2nd dose prior to 6 months from receipt of the first.   Will fup with GI doctor regarding colonoscopy   Fall Prevention in the [place] can cause injuries and can affect people from all age groups. There are many simple things that you can do to make your home safe and to help prevent falls. What can I do on the outside of my home?  Regularly repair the edges of walkways and driveways and fix any cracks.  Remove high doorway thresholds.  Trim any shrubbery on the main path into your home.  Use bright outdoor lighting.  Clear walkways of debris and clutter, including tools and rocks.  Regularly check that handrails are securely fastened and in good repair. Both sides of any steps should have handrails.  Install guardrails along the edges of any raised decks or porches.  Have leaves, snow, and ice cleared regularly.  Use sand or salt on walkways during winter months.  In the garage, clean up any spills right away, including grease or oil spills. What can I do in the bathroom?  Use night lights.  Install grab bars by the toilet and in the tub and shower. Do not use towel bars as grab bars.  Use non-skid mats or decals on the floor of the tub or shower.  If you need to sit down while you are in the shower, use a  plastic, non-slip stool.  Keep the floor dry. Immediately clean up any water that spills on the floor.  Remove soap buildup in the tub or shower on a regular basis.  Attach bath mats securely with double-sided non-slip rug tape.  Remove throw rugs and other tripping hazards from the floor. What can I do in the bedroom?  Use night lights.  Make sure that a bedside light is easy to reach.  Do not use oversized bedding that drapes onto the floor.  Have a firm chair that has side arms to use for getting dressed.  Remove throw rugs and other tripping hazards from the floor. What can I do in the kitchen?  Clean up any spills right away.  Avoid walking on wet floors.  Place frequently used items in easy-to-reach places.  If you need to reach for something above you, use a sturdy step stool that has a grab bar.  Keep electrical cables out of the way.  Do not use floor polish or wax that makes floors slippery. If you have to use wax, make sure that it is non-skid floor wax.  Remove throw rugs and other tripping hazards from the floor. What can I do in the stairways?  Do not leave any items on the stairs.  Make sure that there are handrails on both sides of the stairs. Fix  handrails that are broken or loose. Make sure that handrails are as long as the stairways.  Check any carpeting to make sure that it is firmly attached to the stairs. Fix any carpet that is loose or worn.  Avoid having throw rugs at the top or bottom of stairways, or secure the rugs with carpet tape to prevent them from moving.  Make sure that you have a light switch at the top of the stairs and the bottom of the stairs. If you do not have them, have them installed. What are some other fall prevention tips?  Wear closed-toe shoes that fit well and support your feet. Wear shoes that have rubber soles or low heels.  When you use a stepladder, make sure that it is completely opened and that the sides are firmly  locked. Have someone hold the ladder while you are using it. Do not climb a closed stepladder.  Add color or contrast paint or tape to grab bars and handrails in your home. Place contrasting color strips on the first and last steps.  Use mobility aids as needed, such as canes, walkers, scooters, and crutches.  Turn on lights if it is dark. Replace any light bulbs that burn out.  Set up furniture so that there are clear paths. Keep the furniture in the same spot.  Fix any uneven floor surfaces.  Choose a carpet design that does not hide the edge of steps of a stairway.  Be aware of any and all pets.  Review your medicines with your healthcare provider. Some medicines can cause dizziness or changes in blood pressure, which increase your risk of falling. Talk with your health care provider about other ways that you can decrease your risk of falls. This may include working with a physical therapist or trainer to improve your strength, balance, and endurance. This information is not intended to replace advice given to you by your health care provider. Make sure you discuss any questions you have with your health care provider. Document Released: 02/27/2002 Document Revised: 08/06/2015 Document Reviewed: 04/13/2014 Elsevier Interactive Patient Education  [employer].    These are the goals we discussed: Goals    . Patient Stated     Will remain healthy Continue to be able to live independently        This is a list of the screening recommended for you and due dates:  Health Maintenance  Topic Date Due  . Colon Cancer Screening  11/03/2016  . Mammogram  05/31/2035*  . Tetanus Vaccine  05/23/2022  . Flu Shot  Completed  . DEXA scan (bone density measurement)  Completed  .  Hepatitis C: One time screening is recommended by Center for Disease Control  (CDC) for  adults born from [birth year] through 1967.   Completed  . Pneumonia vaccines  Completed  *Topic was postponed. The date  shown is not the original due date.   ]  Health Maintenance, Female Adopting a healthy lifestyle and getting preventive care can go a long way to promote health and wellness. Talk with your health care provider about what schedule of regular examinations is right for you. This is a good chance for you to check in with your provider about disease prevention and staying healthy. In between checkups, there are plenty of things you can do on your own. Experts have done a lot of research about which lifestyle changes and preventive measures are most likely to keep you healthy. Ask your health care provider  for more information. Weight and diet Eat a healthy diet  Be sure to include plenty of vegetables, fruits, low-fat dairy products, and lean protein.  Do not eat a lot of foods high in solid fats, added sugars, or salt.  Get regular exercise. This is one of the most important things you can do for your health. ? Most adults should exercise for at least 150 minutes each week. The exercise should increase your heart rate and make you sweat (moderate-intensity exercise). ? Most adults should also do strengthening exercises at least twice a week. This is in addition to the moderate-intensity exercise.  Maintain a healthy weight  Body mass index (BMI) is a measurement that can be used to identify possible weight problems. It estimates body fat based on height and weight. Your health care provider can help determine your BMI and help you achieve or maintain a healthy weight.  For females 33 years of age and older: ? A BMI below 18.5 is considered underweight. ? A BMI of 18.5 to 24.9 is normal. ? A BMI of 25 to 29.9 is considered overweight. ? A BMI of 30 and above is considered obese.  Watch levels of cholesterol and blood lipids  You should start having your blood tested for lipids and cholesterol at 74 years of age, then have this test every 5 years.  You may need to have your cholesterol  levels checked more often if: ? Your lipid or cholesterol levels are high. ? You are older than 74 years of age. ? You are at high risk for heart disease.  Cancer screening Lung Cancer  Lung cancer screening is recommended for adults 64-25 years old who are at high risk for lung cancer because of a history of smoking.  A yearly low-dose CT scan of the lungs is recommended for people who: ? Currently smoke. ? Have quit within the past 15 years. ? Have at least a 30-pack-year history of smoking. A pack year is smoking an average of one pack of cigarettes a day for 1 year.  Yearly screening should continue until it has been 15 years since you quit.  Yearly screening should stop if you develop a health problem that would prevent you from having lung cancer treatment.  Breast Cancer  Practice breast self-awareness. This means understanding how your breasts normally appear and feel.  It also means doing regular breast self-exams. Let your health care provider know about any changes, no matter how small.  If you are in your 20s or 30s, you should have a clinical breast exam (CBE) by a health care provider every 1-3 years as part of a regular health exam.  If you are 73 or older, have a CBE every year. Also consider having a breast X-ray (mammogram) every year.  If you have a family history of breast cancer, talk to your health care provider about genetic screening.  If you are at high risk for breast cancer, talk to your health care provider about having an MRI and a mammogram every year.  Breast cancer gene (BRCA) assessment is recommended for women who have family members with BRCA-related cancers. BRCA-related cancers include: ? Breast. ? Ovarian. ? Tubal. ? Peritoneal cancers.  Results of the assessment will determine the need for genetic counseling and BRCA1 and BRCA2 testing.  Cervical Cancer Your health care provider may recommend that you be screened regularly for cancer of  the pelvic organs (ovaries, uterus, and vagina). This screening involves a pelvic examination, including  checking for microscopic changes to the surface of your cervix (Pap test). You may be encouraged to have this screening done every 3 years, beginning at age 19.  For women ages 53-65, health care providers may recommend pelvic exams and Pap testing every 3 years, or they may recommend the Pap and pelvic exam, combined with testing for human papilloma virus (HPV), every 5 years. Some types of HPV increase your risk of cervical cancer. Testing for HPV may also be done on women of any age with unclear Pap test results.  Other health care providers may not recommend any screening for nonpregnant women who are considered low risk for pelvic cancer and who do not have symptoms. Ask your health care provider if a screening pelvic exam is right for you.  If you have had past treatment for cervical cancer or a condition that could lead to cancer, you need Pap tests and screening for cancer for at least 20 years after your treatment. If Pap tests have been discontinued, your risk factors (such as having a new sexual partner) need to be reassessed to determine if screening should resume. Some women have medical problems that increase the chance of getting cervical cancer. In these cases, your health care provider may recommend more frequent screening and Pap tests.  Colorectal Cancer  This type of cancer can be detected and often prevented.  Routine colorectal cancer screening usually begins at 74 years of age and continues through 74 years of age.  Your health care provider may recommend screening at an earlier age if you have risk factors for colon cancer.  Your health care provider may also recommend using home test kits to check for hidden blood in the stool.  A small camera at the end of a tube can be used to examine your colon directly (sigmoidoscopy or colonoscopy). This is done to check for the  earliest forms of colorectal cancer.  Routine screening usually begins at age 23.  Direct examination of the colon should be repeated every 5-10 years through 74 years of age. However, you may need to be screened more often if early forms of precancerous polyps or small growths are found.  Skin Cancer  Check your skin from head to toe regularly.  Tell your health care provider about any new moles or changes in moles, especially if there is a change in a mole's shape or color.  Also tell your health care provider if you have a mole that is larger than the size of a pencil eraser.  Always use sunscreen. Apply sunscreen liberally and repeatedly throughout the day.  Protect yourself by wearing long sleeves, pants, a wide-brimmed hat, and sunglasses whenever you are outside.  Heart disease, diabetes, and high blood pressure  High blood pressure causes heart disease and increases the risk of stroke. High blood pressure is more likely to develop in: ? People who have blood pressure in the high end of the normal range (130-139/85-89 mm Hg). ? People who are overweight or obese. ? People who are African American.  If you are 50-8 years of age, have your blood pressure checked every 3-5 years. If you are 14 years of age or older, have your blood pressure checked every year. You should have your blood pressure measured twice-once when you are at a hospital or clinic, and once when you are not at a hospital or clinic. Record the average of the two measurements. To check your blood pressure when you are not at a  hospital or clinic, you can use: ? An automated blood pressure machine at a pharmacy. ? A home blood pressure monitor.  If you are between 70 years and 61 years old, ask your health care provider if you should take aspirin to prevent strokes.  Have regular diabetes screenings. This involves taking a blood sample to check your fasting blood sugar level. ? If you are at a normal weight and  have a low risk for diabetes, have this test once every three years after 74 years of age. ? If you are overweight and have a high risk for diabetes, consider being tested at a younger age or more often. Preventing infection Hepatitis B  If you have a higher risk for hepatitis B, you should be screened for this virus. You are considered at high risk for hepatitis B if: ? You were born in a country where hepatitis B is common. Ask your health care provider which countries are considered high risk. ? Your parents were born in a high-risk country, and you have not been immunized against hepatitis B (hepatitis B vaccine). ? You have HIV or AIDS. ? You use needles to inject street drugs. ? You live with someone who has hepatitis B. ? You have had sex with someone who has hepatitis B. ? You get hemodialysis treatment. ? You take certain medicines for conditions, including cancer, organ transplantation, and autoimmune conditions.  Hepatitis C  Blood testing is recommended for: ? Everyone born from [birth year] through 1967. ? Anyone with known risk factors for hepatitis C.  Sexually transmitted infections (STIs)  You should be screened for sexually transmitted infections (STIs) including gonorrhea and chlamydia if: ? You are sexually active and are younger than 74 years of age. ? You are older than 74 years of age and your health care provider tells you that you are at risk for this type of infection. ? Your sexual activity has changed since you were last screened and you are at an increased risk for chlamydia or gonorrhea. Ask your health care provider if you are at risk.  If you do not have HIV, but are at risk, it may be recommended that you take a prescription medicine daily to prevent HIV infection. This is called pre-exposure prophylaxis (PrEP). You are considered at risk if: ? You are sexually active and do not regularly use condoms or know the HIV status of your partner(s). ? You take drugs by  injection. ? You are sexually active with a partner who has HIV.  Talk with your health care provider about whether you are at high risk of being infected with HIV. If you choose to begin PrEP, you should first be tested for HIV. You should then be tested every 3 months for as long as you are taking PrEP. Pregnancy  If you are premenopausal and you may become pregnant, ask your health care provider about preconception counseling.  If you may become pregnant, take 400 to 800 micrograms (mcg) of folic acid every day.  If you want to prevent pregnancy, talk to your health care provider about birth control (contraception). Osteoporosis and menopause  Osteoporosis is a disease in which the bones lose minerals and strength with aging. This can result in serious bone fractures. Your risk for osteoporosis can be identified using a bone density scan.  If you are 103 years of age or older, or if you are at risk for osteoporosis and fractures, ask your health care provider if you should be screened.  Ask your health care provider whether you should take a calcium or vitamin D supplement to lower your risk for osteoporosis.  Menopause may have certain physical symptoms and risks.  Hormone replacement therapy may reduce some of these symptoms and risks. Talk to your health care provider about whether hormone replacement therapy is right for you. Follow these instructions at home:  Schedule regular health, dental, and eye exams.  Stay current with your immunizations.  Do not use any tobacco products including cigarettes, chewing tobacco, or electronic cigarettes.  If you are pregnant, do not drink alcohol.  If you are breastfeeding, limit how much and how often you drink alcohol.  Limit alcohol intake to no more than 1 drink per day for nonpregnant women. One drink equals 12 ounces of beer, 5 ounces of wine, or 1 ounces of hard liquor.  Do not use street drugs.  Do not share needles.  Ask  your health care provider for help if you need support or information about quitting drugs.  Tell your health care provider if you often feel depressed.  Tell your health care provider if you have ever been abused or do not feel safe at home. This information is not intended to replace advice given to you by your health care provider. Make sure you discuss any questions you have with your health care provider. Document Released: 09/22/2010 Document Revised: 08/15/2015 Document Reviewed: 12/11/2014 Elsevier Interactive Patient Education  2018 [employer].   Colonoscopy, Adult A colonoscopy is an exam to look at the entire large intestine. During the exam, a lubricated, bendable tube is inserted into the anus and then passed into the rectum, colon, and other parts of the large intestine. A colonoscopy is often done as a part of normal colorectal screening or in response to certain symptoms, such as anemia, persistent diarrhea, abdominal pain, and blood in the stool. The exam can help screen for and diagnose medical problems, including:  Tumors.  Polyps.  Inflammation.  Areas of bleeding.  Tell a health care provider about:  Any allergies you have.  All medicines you are taking, including vitamins, herbs, eye drops, creams, and over-the-counter medicines.  Any problems you or family members have had with anesthetic medicines.  Any blood disorders you have.  Any surgeries you have had.  Any medical conditions you have.  Any problems you have had passing stool. What are the risks? Generally, this is a safe procedure. However, problems may occur, including:  Bleeding.  A tear in the intestine.  A reaction to medicines given during the exam.  Infection (rare).  What happens before the procedure? Eating and drinking restrictions Follow instructions from your health care provider about eating and drinking, which may include:  A few days before the procedure - follow a  low-fiber diet. Avoid nuts, seeds, dried fruit, raw fruits, and vegetables.  1-3 days before the procedure - follow a clear liquid diet. Drink only clear liquids, such as clear broth or bouillon, black coffee or tea, clear juice, clear soft drinks or sports drinks, gelatin dessert, and popsicles. Avoid any liquids that contain red or purple dye.  On the day of the procedure - do not eat or drink anything during the 2 hours before the procedure, or within the time period that your health care provider recommends.  Bowel prep If you were prescribed an oral bowel prep to clean out your colon:  Take it as told by your health care provider. Starting the day before your procedure,  you will need to drink a large amount of medicated liquid. The liquid will cause you to have multiple loose stools until your stool is almost clear or light green.  If your skin or anus gets irritated from diarrhea, you may use these to relieve the irritation: ? Medicated wipes, such as adult wet wipes with aloe and vitamin E. ? A skin soothing-product like petroleum jelly.  If you vomit while drinking the bowel prep, take a break for up to 60 minutes and then begin the bowel prep again. If vomiting continues and you cannot take the bowel prep without vomiting, call your health care provider.  General instructions  Ask your health care provider about changing or stopping your regular medicines. This is especially important if you are taking diabetes medicines or blood thinners.  Plan to have someone take you home from the hospital or clinic. What happens during the procedure?  An IV tube may be inserted into one of your veins.  You will be given medicine to help you relax (sedative).  To reduce your risk of infection: ? Your health care team will wash or sanitize their hands. ? Your anal area will be washed with soap.  You will be asked to lie on your side with your knees bent.  Your health care provider will  lubricate a long, thin, flexible tube. The tube will have a camera and a light on the end.  The tube will be inserted into your anus.  The tube will be gently eased through your rectum and colon.  Air will be delivered into your colon to keep it open. You may feel some pressure or cramping.  The camera will be used to take images during the procedure.  A small tissue sample may be removed from your body to be examined under a microscope (biopsy). If any potential problems are found, the tissue will be sent to a lab for testing.  If small polyps are found, your health care provider may remove them and have them checked for cancer cells.  The tube that was inserted into your anus will be slowly removed. The procedure may vary among health care providers and hospitals. What happens after the procedure?  Your blood pressure, heart rate, breathing rate, and blood oxygen level will be monitored until the medicines you were given have worn off.  Do not drive for 24 hours after the exam.  You may have a small amount of blood in your stool.  You may pass gas and have mild abdominal cramping or bloating due to the air that was used to inflate your colon during the exam.  It is up to you to get the results of your procedure. Ask your health care provider, or the department performing the procedure, when your results will be ready. This information is not intended to replace advice given to you by your health care provider. Make sure you discuss any questions you have with your health care provider. Document Released: 03/06/2000 Document Revised: 01/08/2016 Document Reviewed: 05/21/2015 Elsevier Interactive Patient Education  2018 [employer].

## 2017-04-26 ENCOUNTER — Encounter (INDEPENDENT_AMBULATORY_CARE_PROVIDER_SITE_OTHER): Payer: Self-pay | Admitting: Ophthalmology

## 2017-04-26 ENCOUNTER — Ambulatory Visit (INDEPENDENT_AMBULATORY_CARE_PROVIDER_SITE_OTHER): Payer: Medicare Other | Admitting: Ophthalmology

## 2017-04-26 ENCOUNTER — Other Ambulatory Visit: Payer: Self-pay | Admitting: Cardiology

## 2017-04-26 DIAGNOSIS — H3581 Retinal edema: Secondary | ICD-10-CM

## 2017-04-26 DIAGNOSIS — H25813 Combined forms of age-related cataract, bilateral: Secondary | ICD-10-CM | POA: Diagnosis not present

## 2017-04-26 DIAGNOSIS — H353132 Nonexudative age-related macular degeneration, bilateral, intermediate dry stage: Secondary | ICD-10-CM

## 2017-04-26 DIAGNOSIS — H33012 Retinal detachment with single break, left eye: Secondary | ICD-10-CM | POA: Diagnosis not present

## 2017-04-26 DIAGNOSIS — H3321 Serous retinal detachment, right eye: Secondary | ICD-10-CM

## 2017-04-26 MED ORDER — DILTIAZEM HCL ER COATED BEADS 180 MG PO CP24: 180.0000 mg | ORAL_CAPSULE | Freq: Two times a day (BID) | ORAL | 2 refills | Status: DC

## 2017-04-26 MED ORDER — NEOMYCIN-POLYMYXIN-DEXAMETH 3.5-10000-0.1 OP OINT: 1.0000 "application " | TOPICAL_OINTMENT | Freq: Four times a day (QID) | OPHTHALMIC | 1 refills | Status: DC

## 2017-04-26 NOTE — Progress Notes (Signed)
[facility] Note  04/26/2017     CHIEF COMPLAINT Patient presents for Retina Evaluation   HISTORY OF PRESENT ILLNESS: [name] is a 72 y.o. female who presents to the clinic today for:   HPI    Retina Evaluation    In both eyes.  This started 5 days ago.  Duration of 5 days.  Associated Symptoms Distortion and Flashes.  Negative for Floaters, Redness, Scalp Tenderness, Weight Loss, Fever, Trauma, Pain, Photophobia, Jaw Claudication, Fatigue, Shoulder/Hip pain, Glare and Blind Spot.  Context:  distance vision, mid-range vision, near vision, reading and watching TV.  Treatments tried include no treatments.  I, the attending physician,  performed the HPI with the patient and updated documentation appropriately.          Comments    Referral of Dr. Delman Cheadle for retina evaluation,possible tear. Patient states last wednesday (04/21/17) she noticed small strobe lights  in her peripheral vision when she closed her left eye. Pt reports darkness when she looks at objects OD.denies wavy lines and ocular pain. Denies gtts/vits       Last edited by Bernarda Caffey, MD on 04/26/2017  4:31 PM. (History)    Pt states she noticed trouble seeing OD; Pt state she was watching TV and noticed difficulty, pt states she then closed OS and was unable to see from OD; Pt reports she saw Dr. Delman Cheadle on an emergent basis today; pt reports seeing "strobe lights" x a few days; Pt denies having any neck or back problems; Pt states she had a "normal eye exam, not too long ago";   Referring physician: Melissa Noon, OD No address on file  HISTORICAL INFORMATION:   Selected notes from the MEDICAL RECORD NUMBER Referred by Dr. Delman Cheadle for concern of mac off RD LEE- 02.04.19  Ocular Hx-  PMH- A-Fib on coumadin, severe anxiety, CAD, depression    CURRENT MEDICATIONS: Current Outpatient Medications (Ophthalmic Drugs)  Medication Sig  . neomycin-polymyxin b-dexamethasone (MAXITROL)  3.5-10000-0.1 OINT Place 1 application into the right eye 4 (four) times daily.   No current facility-administered medications for this visit.  (Ophthalmic Drugs)   Current Outpatient Medications (Other)  Medication Sig  . ALPRAZolam (XANAX) 1 MG tablet Take 1 tablet (1 mg total) by mouth 3 (three) times daily as needed for anxiety.  Marland Kitchen b complex vitamins capsule Take 2 capsules by mouth daily.   . Biotin 5 MG CAPS Take by mouth.  . Cholecalciferol (VITAMIN D) 2000 UNITS CAPS Take 2,000 Int'l Units by mouth.  . Coenzyme Q10-Fish Oil-Vit E (CO-Q 10 OMEGA-3 FISH OIL PO) Take 100 mg elemental calcium/kg/hr by mouth daily.  . Diclofenac Sodium (PENNSAID) 2 % SOLN Place 1 application onto the skin 2 (two) times daily as needed.  . diltiazem (CARDIZEM CD) 180 MG 24 hr capsule Take 1 capsule (180 mg total) by mouth 2 (two) times daily.  . DULoxetine (CYMBALTA) 60 MG capsule Take 1 capsule (60 mg total) by mouth daily.  . fluticasone (FLONASE) 50 MCG/ACT nasal spray instill 2 sprays into each nostril once daily (Patient taking differently: instill 2 sprays into each nostril once daily as needed)  . furosemide (LASIX) 20 MG tablet Take 1 tablet (20 mg total) by mouth daily. Please keep upcoming appointment for further refills  . loratadine (CLARITIN) 10 MG tablet Take 10 mg by mouth daily.   Marland Kitchen losartan (COZAAR) 25 MG tablet TAKE 1 TABLET BY MOUTH DAILY  . Lysine 500 MG TABS  Take 500 mg by mouth daily.  . metoprolol succinate (TOPROL-XL) 25 MG 24 hr tablet Take 1 tablet (25 mg total) by mouth daily.  . nitroGLYCERIN (NITROSTAT) 0.4 MG SL tablet place 1 tablet under the tongue every 5 minutes for UP TO 3 doses if needed  . Pitavastatin Calcium (LIVALO) 2 MG TABS Take 1 tablet (2 mg total) by mouth daily.  . valACYclovir (VALTREX) 1000 MG tablet Take 2 tablets at onset of cold sores and repeat 2 in 12 hours.  Marland Kitchen warfarin (COUMADIN) 5 MG tablet take 1 tablet by mouth once daily except 1/2 tablet on Sun and  Tues OR AS DIRECTED   No current facility-administered medications for this visit.  (Other)      REVIEW OF SYSTEMS: ROS    Positive for: Cardiovascular, Eyes   Negative for: Constitutional, Gastrointestinal, Neurological, Skin, Genitourinary, Musculoskeletal, HENT, Endocrine, Respiratory, Psychiatric, Allergic/Imm, Heme/Lymph   Last edited by Zenovia Jordan, LPN on 09/22/2200  5:42 PM. (History)       ALLERGIES Allergies  Allergen Reactions  . Chlorhexidine Base Itching    CHG WIPES  . Penicillins Hives and Rash    PAST MEDICAL HISTORY Past Medical History:  Diagnosis Date  . ABDOMINAL PAIN RIGHT UPPER QUADRANT 11/08/2008  . ALLERGIC RHINITIS 08/01/2009  . Anticoagulated on Coumadin   . Anxiety   . CAD, NATIVE VESSEL 05/30/2009   moderate risk coronary calcium score by chest CT  . DDD (degenerative disc disease), lumbar   . DEPRESSION 06/13/2008  . DYSLIPIDEMIA 02/27/2009  . DYSPNEA 02/27/2009  . Frequency of urination   . GERD (gastroesophageal reflux disease)   . History of Bell's palsy   . History of DVT of lower extremity 2006-- CHRONIC COUMADIN THERAPY  . History of hiatal hernia   . History of Lyme disease   . 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 Mitral stenosis 11/13/2013   mild by echo  . NON-HODGKIN'S LYMPHOMA, HX OF dx  2005--  chemoradiation completed 2006--  no recurrence   onocologist- dr odogwu--   . OSTEOARTHRITIS, MODERATE 04/07/2010  . Permanent atrial fibrillation ([place])   . Retroperitoneal fibrosis    Past Surgical History:  Procedure Laterality Date  . BREAST MASS EXCISION     BENIGN-- RIGHT BREAST  . CARDIOVASCULAR STRESS TEST  04-04-2009-- PER PT ASYMPTOMATIC-- MEDICAL MANAGEMENT   STUDY WAS EQUIVOCAL/ EF 49%/ GLOBAL HYPOKINESIS/ APPEARS TO BE A MILD ANTERIOR PERFUSION DEFECT COULD REPRESENT ISCHEMIA BUT DUE TO  ACTIVITY WORSE IN STRESS THAN AT REST POSS. THE DEFECT WAS ARTIFACTUAL  . CARDIOVERSION N/A  07/11/2015   Procedure: CARDIOVERSION;  Surgeon: Larey Dresser, MD;  Location: [facility];  Service: Cardiovascular;  Laterality: N/A;  . CT ANGIOGRAM  FEB 2011   CALCIUM SCORE 161/ POSS. MODERATE MID LAD STENOSIS  . CYSTOSCOPY W/ RETROGRADES  04/28/2011   Procedure: CYSTOSCOPY WITH RETROGRADE PYELOGRAM;  Surgeon: Fredricka Bonine, MD;  Location[facility];  Service: Urology;  Laterality: Left;  . CYSTOSCOPY W/ URETERAL STENT REMOVAL  04/28/2011   Procedure: CYSTOSCOPY WITH STENT REMOVAL;  Surgeon: Fredricka Bonine, MD;  Location[facility];  Service: Urology;;  . EXPLORATORY LAPAROTOMY  2005   LYMPHOMA  . KNEE ARTHROSCOPY  2005   RIGHT  . MULTIPLE CYSTO/ LEFT URETERAL STENT EXCHANGES  LAST ONE 10-28-10  . RETROPERITONEAL BX  2007  . REVERSE SHOULDER ARTHROPLASTY Right 01/31/2015   Procedure: RIGHT REVERSE SHOULDER ARTHROPLASTY;  Surgeon: Lennette Bihari  Supple, MD;  Location: [facility];  Service: Orthopedics;  Laterality: Right;  . TONSILLECTOMY  CHILD  . TOTAL KNEE ARTHROPLASTY  OCT 2010   RIGHT  . TOTAL KNEE ARTHROPLASTY  JAN 2010   LEFT  . TRANSTHORACIC ECHOCARDIOGRAM  [id number]   MILD SYSTOLIC DYSFUNCTION/ EF 28-76%/ MODERATE DIASTOLIC DYSFUCTION/ MILD MR/ MILD BIATRIAL ENLARGEMENT    FAMILY HISTORY Family History  Problem Relation Age of Onset  . Arthritis Mother   . Heart disease Mother   . Heart attack Mother   . Diabetes Sister   . Hypertension Sister   . Stroke Sister   . Heart attack Sister     SOCIAL HISTORY Social History   Tobacco Use  . Smoking status: Former Smoker    Packs/day: 2.00    Years: 52.00    Pack years: 104.00    Types: Cigarettes    Last attempt to quit: 03/24/2008    Years since quitting: 9.0  . Smokeless tobacco: Never Used  Substance Use Topics  . Alcohol use: Yes    Comment: RARE  . Drug use: No         OPHTHALMIC EXAM:  Base Eye Exam    Visual Acuity (Snellen - Linear)      Right Left    Dist cc 20/250 -1 20/60 -2   Dist ph cc NI 20/40 -1       Tonometry (Tonopen, 4:21 PM)      Right Left   Pressure 12 13       Pupils      Dark Light Shape React APD   Right 7  Round NR None   Left 5 3 Round Brisk None  Pharm dilated       Visual Fields (Counting fingers)      Left Right    Full    Restrictions  Total superior temporal, inferior temporal, superior nasal, inferior nasal deficiencies       Extraocular Movement      Right Left    Full, Ortho Full, Ortho       Neuro/Psych    Oriented x3:  Yes   Mood/Affect:  Normal       Dilation    Right eye:  1.0% Mydriacyl, 2.5% Phenylephrine @ 4:21 PM        Slit Lamp and Fundus Exam    Slit Lamp Exam      Right Left   Lids/Lashes Dermatochalasis - upper lid Dermatochalasis - upper lid   Conjunctiva/Sclera White and quiet White and quiet   Cornea Arcus, 2+ diffuse Punctate epithelial erosions Arcus, 2+ diffuse Punctate epithelial erosions   Anterior Chamber Deep and quiet Deep and quiet   Iris Round and dilated Round and dilated   Lens 3+ Nuclear sclerosis, 2+ Cortical cataract, mild brunescence  3+ Nuclear sclerosis, 2+ Cortical cataract   Vitreous Vitreous syneresis, +tobacco dust Posterior vitreous detachment       Fundus Exam      Right Left   Disc Temporal Peripapillary atrophy, otherwise normal Temporal Peripapillary atrophy, otherwise normal   C/D Ratio 0.35 0.3   Macula +SRF, macula detached attached; drusen; RPE mottling   Vessels Vascular attenuation Vascular attenuation   Periphery bullous ST detachment from 0730 to 1200, small horseshoe tear at 1030, large horseshoe tear at 1100, peripheral cystoid degeneration Attached, mild scattered peripheral cystoid degeneration          IMAGING AND PROCEDURES  Imaging and Procedures for 04/27/17  OCT, Retina - OU -  Both Eyes     Right Eye Central Foveal Thickness: 873. Progression has no prior data. Findings include subretinal fluid, intraretinal  fluid, abnormal foveal contour.   Left Eye Central Foveal Thickness: 275. Progression has no prior data. Findings include normal foveal contour, no IRF, no SRF, retinal drusen , outer retinal atrophy, subretinal hyper-reflective material, epiretinal membrane.   Notes *Images captured and stored on drive  Diagnosis / Impression:  OD: macula/fovea-involving RD OS: Non-Exudative AMD    Clinical management:  See below  Abbreviations: NFP - Normal foveal profile. CME - cystoid macular edema. PED - pigment epithelial detachment. IRF - intraretinal fluid. SRF - subretinal fluid. EZ - ellipsoid zone. ERM - epiretinal membrane. ORA - outer retinal atrophy. ORT - outer retinal tubulation. SRHM - subretinal hyper-reflective material         Pneumatic Retinopexy - OD - Right Eye     PROCEDURE NOTE  Diagnosis:  Retinal detachment with retinal tears, RIGHT EYE  Procedure:  Pneumatic cryopexy with C3F8 gas injection, RIGHT EYE  CPT:  17510  Surgeon: Bernarda Caffey, M.D.,Ph.D.  Anesthesia:  Subconjunctival Lidocaine   The patient was brought to the procedure room. Informed consent had already been obtained for cryopexy of tear and intravitreal gas injection. The diagnosis was reviewed with the patient and all questions were answered. The risks of the procedure including potential systemic risks like stroke and heart attack and other thrombotic events as well as the local risks like infection, endophthalmitis, retinal detachment were discussed. The patient consented to the procedure.   The RIGHT eye was marked and a time out was performed identifying the correct eye. Subsequently, the patient was placed in the supine position. Topical anesthetic drops were given and a lid speculum was placed to expose the eye. Subsequently, 2% Lidocaine was injected subconjunctivally in the quadrants of the tears giving adequate anesthesia. Using indirect ophthalmoscopy the cryo probe was positioned beneath the  tears at 1030 and 1100 oclock and choroidal and retinal whitening was achieved 360 degrees around the tear margins. The superotemporal quadrant was then cleaned with Betadine swabs and allowed to dry. At this time, 4 mm posterior to the limbus utilizing a 30-gauge needle, 0.4 cc of pure, 100% C3F8 gas was injected into the vitreous cavity under direct visualization. The needle was then withdrawn from the eye and the retina and gas bubble were visualized. An AC tap was performed to normalize the pressure and the central retinal artery was noted to be patent. Betadine was then reapplied to the injection sites and then rinsed with sterile BSS. Tobradex ung was applied to the eye, then the eye was patched with an eye pad and paper tape. An arrow was drawn on the dressing to assist with post-operative positioning. There were no complications. Discharge and post-operative instructions were reviewed.                 ASSESSMENT/PLAN:    ICD-10-CM   1. Right retinal detachment H33.21 Pneumatic Retinopexy - OD - Right Eye  2. Retinal edema H35.81 OCT, Retina - OU - Both Eyes  3. Intermediate stage nonexudative age-related macular degeneration of both eyes H35.3132   4. Combined form of age-related cataract, both eyes H25.813     1. Retinal detachment, OD-  The incidence, risk factors, and natural history of retinal detachment was discussed with patient.  Potential treatment options including delimiting laser, pneumatic retinopexy, scleral buckle, and vitrectomy, cryotherapy and laser, and the use of air, gas, and oil  discussed with patient.  The risks of blindness, loss of vision, infection, hemorrhage, cataract progression or lens displacement were discussed with patient. - macula- and fovea-involving rhegmatogenous retinal detachment - OCT shows IRF/cystic changes suggestive of chronicity of detachment - supero-temporal detachment from 0730 to 1200 with small horseshoe tear located at 1030 and large  horseshoe tear located at 1100 - pt wishes to undergo pneumatic cryopexy OD today (02.04.19) - RBA of procedure discussed, questions answered - specifically discussed possibility of further surgery / procedures later - informed consent obtained and signed - see procedure note - pt tolerated well - post op positioning and instructions reviewed - leave patch on overnight - start maxitrol ung QID OD tomorrow  2. Retinal edema  - as discussed above, detached retina has cystic changes / IRF suggestive of chronic detachment  3. Age related macular degeneration, non-exudative, both eyes  - The incidence, anatomy, and pathology of dry AMD, risk of progression, and the AREDS and AREDS 2 study including smoking risks discussed with patient.  - Recommend amsler grid monitoring  4. Combined form age-related cataract OU-  - The symptoms of cataract, surgical options, and treatments and risks were discussed with patient. - discussed diagnosis and progression - visually significant - recommend cataract evaluation once retina stable / healed   Ophthalmic Meds Ordered this visit:  Meds ordered this encounter  Medications  . neomycin-polymyxin b-dexamethasone (MAXITROL) 3.5-10000-0.1 OINT    Sig: Place 1 application into the right eye 4 (four) times daily.    Dispense:  3.5 g    Refill:  1       Return in about 2 days (around 04/28/2017) for POV.  There are no Patient Instructions on file for this visit.   Explained the diagnoses, plan, and follow up with the patient and they expressed understanding.  Patient expressed understanding of the importance of proper follow up care.   This document serves as a record of services personally performed by Gardiner Sleeper, MD, PhD. It was created on their behalf by Catha Brow, [place], a certified ophthalmic assistant. The creation of this record is the provider's dictation and/or activities during the visit.  Electronically signed by: Catha Brow, COA  04/27/17 12:17 AM   Gardiner Sleeper, M.D., Ph.D. Diseases & Surgery of the Retina and Vitreous [facility] 04/27/17   I have reviewed the above documentation for accuracy and completeness, and I agree with the above. Gardiner Sleeper, M.D., Ph.D. 04/27/17 12:32 AM   Abbreviations: M myopia (nearsighted); A astigmatism; H hyperopia (farsighted); P presbyopia; Mrx spectacle prescription;  CTL contact lenses; OD right eye; OS left eye; OU both eyes  XT exotropia; ET esotropia; PEK punctate epithelial keratitis; PEE punctate epithelial erosions; DES dry eye syndrome; MGD meibomian gland dysfunction; ATs artificial tears; PFAT's preservative free artificial tears; [place] nuclear sclerotic cataract; PSC posterior subcapsular cataract; ERM epi-retinal membrane; PVD posterior vitreous detachment; RD retinal detachment; DM diabetes mellitus; DR diabetic retinopathy; NPDR non-proliferative diabetic retinopathy; PDR proliferative diabetic retinopathy; CSME clinically significant macular edema; DME diabetic macular edema; dbh dot blot hemorrhages; CWS cotton wool spot; POAG primary open angle glaucoma; C/D cup-to-disc ratio; HVF humphrey visual field; GVF goldmann visual field; OCT optical coherence tomography; IOP intraocular pressure; BRVO Branch retinal vein occlusion; CRVO central retinal vein occlusion; CRAO central retinal artery occlusion; BRAO branch retinal artery occlusion; RT retinal tear; SB scleral buckle; PPV pars plana vitrectomy; VH Vitreous hemorrhage; PRP panretinal laser photocoagulation; IVK intravitreal  kenalog; VMT vitreomacular traction; MH Macular hole;  NVD neovascularization of the disc; NVE neovascularization elsewhere; AREDS age related eye disease study; ARMD age related macular degeneration; POAG primary open angle glaucoma; EBMD epithelial/anterior basement membrane dystrophy; ACIOL anterior chamber intraocular lens; IOL intraocular lens; PCIOL  posterior chamber intraocular lens; Phaco/IOL phacoemulsification with intraocular lens placement; [place] photorefractive keratectomy; LASIK laser assisted in situ keratomileusis; HTN hypertension; DM diabetes mellitus; COPD chronic obstructive pulmonary disease

## 2017-04-27 ENCOUNTER — Encounter (INDEPENDENT_AMBULATORY_CARE_PROVIDER_SITE_OTHER): Payer: Self-pay | Admitting: Ophthalmology

## 2017-04-27 NOTE — Progress Notes (Signed)
[facility] Note  04/28/2017     CHIEF COMPLAINT Patient presents for Post-op Follow-up   HISTORY OF PRESENT ILLNESS: [name] is a 72 y.o. female who presents to the clinic today for:   HPI    Post-op Follow-up    In right eye.  Discomfort includes none.  Negative for pain, itching, foreign body sensation, tearing, discharge and floaters.  Vision is stable.  I, the attending physician,  performed the HPI with the patient and updated documentation appropriately.          Comments    S/P pneumatic cryopexy OD (02.04.19); Pt states she has not noted any visual changes OD, Pt states OD is comfortable; Pt reports she has not been able to keep head in position; Pt states her neck is in too much pain; Pt states she needs a muscle relaxer to be in position; Pt denies floaters, denies flashes, denies wavy VA;        Last edited by Bernarda Caffey, MD on 04/28/2017  9:46 AM. (History)      Referring physician: [name], MD [place], [place] [id number]  HISTORICAL INFORMATION:   Selected notes from the [facility] [place] Referred by Dr. Delman Cheadle for concern of mac off RD LEE- 02.04.19  Ocular Hx-  PMH- A-Fib on coumadin, severe anxiety, CAD, depression    CURRENT MEDICATIONS: Current Outpatient Medications (Ophthalmic Drugs)  Medication Sig  . neomycin-polymyxin b-dexamethasone (MAXITROL) 3.5-10000-0.1 OINT Place 1 application into the right eye 4 (four) times daily.   No current facility-administered medications for this visit.  (Ophthalmic Drugs)   Current Outpatient Medications (Other)  Medication Sig  . ALPRAZolam (XANAX) 1 MG tablet Take 1 tablet (1 mg total) by mouth 3 (three) times daily as needed for anxiety.  Marland Kitchen b complex vitamins capsule Take 2 capsules by mouth daily.   . Biotin 5 MG CAPS Take by mouth.  . Cholecalciferol (VITAMIN D) 2000 UNITS CAPS Take 2,000 Int'l Units by mouth.  . Coenzyme Q10-Fish  Oil-Vit E (CO-Q 10 OMEGA-3 FISH OIL PO) Take 100 mg elemental calcium/kg/hr by mouth daily.  . Diclofenac Sodium (PENNSAID) 2 % SOLN Place 1 application onto the skin 2 (two) times daily as needed.  . diltiazem (CARDIZEM CD) 180 MG 24 hr capsule Take 1 capsule (180 mg total) by mouth 2 (two) times daily.  . DULoxetine (CYMBALTA) 60 MG capsule Take 1 capsule (60 mg total) by mouth daily.  . fluticasone (FLONASE) 50 MCG/ACT nasal spray instill 2 sprays into each nostril once daily (Patient taking differently: instill 2 sprays into each nostril once daily as needed)  . furosemide (LASIX) 20 MG tablet Take 1 tablet (20 mg total) by mouth daily. Please keep upcoming appointment for further refills  . loratadine (CLARITIN) 10 MG tablet Take 10 mg by mouth daily.   Marland Kitchen losartan (COZAAR) 25 MG tablet TAKE 1 TABLET BY MOUTH DAILY  . Lysine 500 MG TABS Take 500 mg by mouth daily.  . metoprolol succinate (TOPROL-XL) 25 MG 24 hr tablet Take 1 tablet (25 mg total) by mouth daily.  . nitroGLYCERIN (NITROSTAT) 0.4 MG SL tablet place 1 tablet under the tongue every 5 minutes for UP TO 3 doses if needed  . Pitavastatin Calcium (LIVALO) 2 MG TABS Take 1 tablet (2 mg total) by mouth daily.  . valACYclovir (VALTREX) 1000 MG tablet Take 2 tablets at onset of cold sores and repeat 2 in 12 hours.  Marland Kitchen  warfarin (COUMADIN) 5 MG tablet take 1 tablet by mouth once daily except 1/2 tablet on Sun and Tues OR AS DIRECTED   No current facility-administered medications for this visit.  (Other)      REVIEW OF SYSTEMS: ROS    Positive for: Musculoskeletal, Cardiovascular, Eyes, Psychiatric   Negative for: Constitutional, Gastrointestinal, Neurological, Skin, Genitourinary, HENT, Endocrine, Respiratory, Allergic/Imm, Heme/Lymph   Last edited by Cherrie Gauze on 04/28/2017  9:34 AM. (History)       ALLERGIES Allergies  Allergen Reactions  . Chlorhexidine Base Itching    CHG WIPES  . Penicillins Hives and Rash     PAST MEDICAL HISTORY Past Medical History:  Diagnosis Date  . ABDOMINAL PAIN RIGHT UPPER QUADRANT 11/08/2008  . ALLERGIC RHINITIS 08/01/2009  . Anticoagulated on Coumadin   . Anxiety   . CAD, NATIVE VESSEL 05/30/2009   moderate risk coronary calcium score by chest CT  . DDD (degenerative disc disease), lumbar   . DEPRESSION 06/13/2008  . DYSLIPIDEMIA 02/27/2009  . DYSPNEA 02/27/2009  . Frequency of urination   . GERD (gastroesophageal reflux disease)   . History of Bell's palsy   . History of DVT of lower extremity 2006-- CHRONIC COUMADIN THERAPY  . History of hiatal hernia   . History of Lyme disease   . 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 Mitral stenosis 11/13/2013   mild by echo  . NON-HODGKIN'S LYMPHOMA, HX OF dx  2005--  chemoradiation completed 2006--  no recurrence   onocologist- dr odogwu--   . OSTEOARTHRITIS, MODERATE 04/07/2010  . Permanent atrial fibrillation ([place])   . Retroperitoneal fibrosis    Past Surgical History:  Procedure Laterality Date  . BREAST MASS EXCISION     BENIGN-- RIGHT BREAST  . CARDIOVASCULAR STRESS TEST  04-04-2009-- PER PT ASYMPTOMATIC-- MEDICAL MANAGEMENT   STUDY WAS EQUIVOCAL/ EF 49%/ GLOBAL HYPOKINESIS/ APPEARS TO BE A MILD ANTERIOR PERFUSION DEFECT COULD REPRESENT ISCHEMIA BUT DUE TO  ACTIVITY WORSE IN STRESS THAN AT REST POSS. THE DEFECT WAS ARTIFACTUAL  . CARDIOVERSION N/A 07/11/2015   Procedure: CARDIOVERSION;  Surgeon: Larey Dresser, MD;  Location: [facility];  Service: Cardiovascular;  Laterality: N/A;  . CT ANGIOGRAM  FEB 2011   CALCIUM SCORE 161/ POSS. MODERATE MID LAD STENOSIS  . CYSTOSCOPY W/ RETROGRADES  04/28/2011   Procedure: CYSTOSCOPY WITH RETROGRADE PYELOGRAM;  Surgeon: Fredricka Bonine, MD;  Location: [facility];  Service: Urology;  Laterality: Left;  . CYSTOSCOPY W/ URETERAL STENT REMOVAL  04/28/2011   Procedure: CYSTOSCOPY WITH STENT REMOVAL;  Surgeon: Fredricka Bonine, MD;  Location: [facility];  Service: Urology;;  . EXPLORATORY LAPAROTOMY  2005   LYMPHOMA  . KNEE ARTHROSCOPY  2005   RIGHT  . MULTIPLE CYSTO/ LEFT URETERAL STENT EXCHANGES  LAST ONE 10-28-10  . RETROPERITONEAL BX  2007  . REVERSE SHOULDER ARTHROPLASTY Right 01/31/2015   Procedure: RIGHT REVERSE SHOULDER ARTHROPLASTY;  Surgeon: Justice Britain, MD;  Location: [facility];  Service: Orthopedics;  Laterality: Right;  . TONSILLECTOMY  CHILD  . TOTAL KNEE ARTHROPLASTY  OCT 2010   RIGHT  . TOTAL KNEE ARTHROPLASTY  JAN 2010   LEFT  . TRANSTHORACIC ECHOCARDIOGRAM  [id number]   MILD SYSTOLIC DYSFUNCTION/ EF 16-38%/ MODERATE DIASTOLIC DYSFUCTION/ MILD MR/ MILD BIATRIAL ENLARGEMENT    FAMILY HISTORY Family History  Problem Relation Age of Onset  . Arthritis Mother   . Heart disease Mother   . Heart attack Mother   .  Diabetes Sister   . Hypertension Sister   . Stroke Sister   . Heart attack Sister     SOCIAL HISTORY Social History   Tobacco Use  . Smoking status: Former Smoker    Packs/day: 2.00    Years: 52.00    Pack years: 104.00    Types: Cigarettes    Last attempt to quit: 03/24/2008    Years since quitting: 9.1  . Smokeless tobacco: Never Used  Substance Use Topics  . Alcohol use: Yes    Comment: RARE  . Drug use: No         OPHTHALMIC EXAM:  Base Eye Exam    Visual Acuity (Snellen - Linear)      Right Left   Dist [place] CF @ 2'    Dist cc 20/800 20/60   Dist ph [place] NI    Dist ph cc NI 20/50       Tonometry (Tonopen, 9:45 AM)      Right Left   Pressure 19 14       Pupils      Dark Shape React APD   Right 5 Round Minimal None   Left 5 Round Minimal None       Neuro/Psych    Oriented x3:  Yes   Mood/Affect:  Normal       Dilation    Right eye:  1.0% Mydriacyl, 2.5% Phenylephrine @ 9:45 AM        Slit Lamp and Fundus Exam    External Exam      Right Left   External Periorbital edema        Slit Lamp Exam      Right  Left   Lids/Lashes Dermatochalasis - upper lid Dermatochalasis - upper lid   Conjunctiva/Sclera Pneumatic Chemosis, Subconjunctival hemorrhage, 3+ Injection White and quiet   Cornea Arcus, 1+ diffuse Punctate epithelial erosions Arcus, 2+ diffuse Punctate epithelial erosions   Anterior Chamber Deep and quiet Deep and quiet   Iris Round and dilated Round and dilated   Lens 3+ Nuclear sclerosis, 2+ Cortical cataract, mild brunescence  3+ Nuclear sclerosis, 2+ Cortical cataract   Vitreous Vitreous syneresis, +tobacco dust Posterior vitreous detachment       Fundus Exam      Right Left   Disc Temporal Peripapillary atrophy, otherwise normal    C/D Ratio 0.35 0.3   Macula +SRF - improved, macula detached    Vessels Vascular attenuation    Periphery bullous ST detachment from 0730 to 1200--improved, small horseshoe tear at 1030, large horseshoe tear at 1100 - good cryo changes surrounding, SRF is improved, pigmented demarcation line along 0730 border           IMAGING AND PROCEDURES  Imaging and Procedures for 04/28/17  Color Fundus Photography Optos - OD - Right Eye     Progression has no prior data. Disc findings include normal observations. Macula : (Interval improvement of SRF). Vessels : attenuated. Periphery : (Single large gas bubble superior vitreous with smaller satellite bubble nasally. Interval improvement in SRF.).                 ASSESSMENT/PLAN:    ICD-10-CM   1. Right retinal detachment H33.21 Color Fundus Photography Optos - OD - Right Eye  2. Retinal edema H35.81   3. Intermediate stage nonexudative age-related macular degeneration of both eyes H35.3132   4. Combined form of age-related cataract, both eyes H25.813     1. Retinal detachment, OD-  -  macula- and fovea-involving rhegmatogenous retinal detachment - initial OCT showed IRF/cystic changes suggestive of chronicity of detachment - supero-temporal detachment from 0730 to 1200 with small horseshoe tear  located at 1030 and large horseshoe tear located at 1100 - s/p pneumatic cryopexy OD (02.04.19) - good early cryo changes present and SRF improving - pt reports difficulty with head positioning due to pain -- requesting muscle relaxing medications - continue head postioning  - maxitrol ung QID OD - will send letter to PCP, Dr. Elease Hashimoto for assistance in managing possible muscle relaxer w/ antidepressant, benzos, and pain medication  - F/U Friday am  2. Retinal edema  - as discussed above, detached retina has cystic changes / IRF suggestive of chronic detachment  3. Age related macular degeneration, non-exudative, both eyes  - The incidence, anatomy, and pathology of dry AMD, risk of progression, and the AREDS and AREDS 2 study including smoking risks discussed with patient.  - Recommend amsler grid monitoring  4. Combined form age-related cataract OU-  - The symptoms of cataract, surgical options, and treatments and risks were discussed with patient. - discussed diagnosis and progression - visually significant - recommend cataract evaluation once retina stable / healed   Ophthalmic Meds Ordered this visit:  No orders of the defined types were placed in this encounter.      Return in about 2 days (around 04/30/2017) for POV (pt to arrive around noon).  There are no Patient Instructions on file for this visit.   Explained the diagnoses, plan, and follow up with the patient and they expressed understanding.  Patient expressed understanding of the importance of proper follow up care.   This document serves as a record of services personally performed by Gardiner Sleeper, MD, PhD. It was created on their behalf by Catha Brow, [place], a certified ophthalmic assistant. The creation of this record is the provider's dictation and/or activities during the visit.  Electronically signed by: Catha Brow, [place]  04/28/17 10:49 AM   Gardiner Sleeper, M.D., Ph.D. Diseases & Surgery of the  Retina and [place] 04/28/17  I have reviewed the above documentation for accuracy and completeness, and I agree with the above. Gardiner Sleeper, M.D., Ph.D. 04/28/17 10:49 AM    Abbreviations: M myopia (nearsighted); A astigmatism; H hyperopia (farsighted); P presbyopia; Mrx spectacle prescription;  CTL contact lenses; OD right eye; OS left eye; OU both eyes  XT exotropia; ET esotropia; PEK punctate epithelial keratitis; PEE punctate epithelial erosions; DES dry eye syndrome; MGD meibomian gland dysfunction; ATs artificial tears; PFAT's preservative free artificial tears; [place] nuclear sclerotic cataract; PSC posterior subcapsular cataract; ERM epi-retinal membrane; PVD posterior vitreous detachment; RD retinal detachment; DM diabetes mellitus; DR diabetic retinopathy; NPDR non-proliferative diabetic retinopathy; PDR proliferative diabetic retinopathy; CSME clinically significant macular edema; DME diabetic macular edema; dbh dot blot hemorrhages; CWS cotton wool spot; POAG primary open angle glaucoma; C/D cup-to-disc ratio; HVF humphrey visual field; GVF goldmann visual field; OCT optical coherence tomography; IOP intraocular pressure; BRVO Branch retinal vein occlusion; CRVO central retinal vein occlusion; CRAO central retinal artery occlusion; BRAO branch retinal artery occlusion; RT retinal tear; SB scleral buckle; PPV pars plana vitrectomy; VH Vitreous hemorrhage; PRP panretinal laser photocoagulation; IVK intravitreal kenalog; VMT vitreomacular traction; MH Macular hole;  NVD neovascularization of the disc; NVE neovascularization elsewhere; AREDS age related eye disease study; ARMD age related macular degeneration; POAG primary open angle glaucoma; EBMD epithelial/anterior basement membrane dystrophy; ACIOL anterior chamber intraocular lens;  IOL intraocular lens; PCIOL posterior chamber intraocular lens; Phaco/IOL phacoemulsification with intraocular lens placement;  [place] photorefractive keratectomy; LASIK laser assisted in situ keratomileusis; HTN hypertension; DM diabetes mellitus; COPD chronic obstructive pulmonary disease

## 2017-04-28 ENCOUNTER — Encounter (INDEPENDENT_AMBULATORY_CARE_PROVIDER_SITE_OTHER): Payer: Self-pay | Admitting: Ophthalmology

## 2017-04-28 ENCOUNTER — Ambulatory Visit (INDEPENDENT_AMBULATORY_CARE_PROVIDER_SITE_OTHER): Payer: Medicare Other | Admitting: Ophthalmology

## 2017-04-28 DIAGNOSIS — H3321 Serous retinal detachment, right eye: Secondary | ICD-10-CM | POA: Diagnosis not present

## 2017-04-28 DIAGNOSIS — H353132 Nonexudative age-related macular degeneration, bilateral, intermediate dry stage: Secondary | ICD-10-CM

## 2017-04-28 DIAGNOSIS — H3581 Retinal edema: Secondary | ICD-10-CM

## 2017-04-28 DIAGNOSIS — H25813 Combined forms of age-related cataract, bilateral: Secondary | ICD-10-CM

## 2017-04-29 ENCOUNTER — Other Ambulatory Visit: Payer: Self-pay | Admitting: Adult Health

## 2017-04-29 ENCOUNTER — Ambulatory Visit: Payer: Self-pay | Admitting: *Deleted

## 2017-04-29 MED ORDER — CYCLOBENZAPRINE HCL 5 MG PO TABS: 5.0000 mg | ORAL_TABLET | Freq: Two times a day (BID) | ORAL | 1 refills | Status: DC | PRN

## 2017-04-29 NOTE — Telephone Encounter (Signed)
I spoke with pt and yes should would like to try the muscle relaxer, [facility] in [place], or I can send in.

## 2017-04-29 NOTE — Telephone Encounter (Signed)
I would be willing to send in a few days of a low dose muscle relaxer but I will leave pain medications for Dr. Elease Hashimoto when he comes back tomorrow

## 2017-04-29 NOTE — Progress Notes (Signed)
[facility] Note  04/30/2017     CHIEF COMPLAINT Patient presents for Post-op Follow-up   HISTORY OF PRESENT ILLNESS: [name] is a 72 y.o. female who presents to the clinic today for:   HPI    Post-op Follow-up    In right eye.  Discomfort includes none.  Negative for pain, itching, foreign body sensation, tearing, discharge and floaters.  Vision is stable.  I, the attending physician,  performed the HPI with the patient and updated documentation appropriately.          Comments    Pt presents today for s/p pneumatic cryopexy OD (02.04.19) pt states VA is stable, pt denies flashes, floaters, pain, or wavy vision, pt states the black spot is smaller, pt states complaince with ointment,       Last edited by Bernarda Caffey, MD on 04/30/2017 11:44 AM. (History)    Pt reports getting a few muscle relaxers from PCP.  Referring physician: [name], MD [place], [place] [id number]  HISTORICAL INFORMATION:   Selected notes from the MEDICAL RECORD NUMBER Referred by Dr. Delman Cheadle for concern of mac off RD LEE- 02.04.19  Ocular Hx-  PMH- A-Fib on coumadin, severe anxiety, CAD, depression    CURRENT MEDICATIONS: Current Outpatient Medications (Ophthalmic Drugs)  Medication Sig  . neomycin-polymyxin b-dexamethasone (MAXITROL) 3.5-10000-0.1 OINT Place 1 application into the right eye 4 (four) times daily.   No current facility-administered medications for this visit.  (Ophthalmic Drugs)   Current Outpatient Medications (Other)  Medication Sig  . ALPRAZolam (XANAX) 1 MG tablet Take 1 tablet (1 mg total) by mouth 3 (three) times daily as needed for anxiety.  Marland Kitchen b complex vitamins capsule Take 2 capsules by mouth daily.   . Biotin 5 MG CAPS Take by mouth.  . Cholecalciferol (VITAMIN D) 2000 UNITS CAPS Take 2,000 Int'l Units by mouth.  . Coenzyme Q10-Fish Oil-Vit E (CO-Q 10 OMEGA-3 FISH OIL PO) Take 100 mg elemental  calcium/kg/hr by mouth daily.  . cyclobenzaprine (FLEXERIL) 5 MG tablet Take 1 tablet (5 mg total) by mouth 2 (two) times daily as needed for muscle spasms.  . Diclofenac Sodium (PENNSAID) 2 % SOLN Place 1 application onto the skin 2 (two) times daily as needed.  . diltiazem (CARDIZEM CD) 180 MG 24 hr capsule Take 1 capsule (180 mg total) by mouth 2 (two) times daily.  . DULoxetine (CYMBALTA) 60 MG capsule Take 1 capsule (60 mg total) by mouth daily.  . fluticasone (FLONASE) 50 MCG/ACT nasal spray instill 2 sprays into each nostril once daily (Patient taking differently: instill 2 sprays into each nostril once daily as needed)  . furosemide (LASIX) 20 MG tablet Take 1 tablet (20 mg total) by mouth daily. Please keep upcoming appointment for further refills  . loratadine (CLARITIN) 10 MG tablet Take 10 mg by mouth daily.   Marland Kitchen losartan (COZAAR) 25 MG tablet TAKE 1 TABLET BY MOUTH DAILY  . Lysine 500 MG TABS Take 500 mg by mouth daily.  . metoprolol succinate (TOPROL-XL) 25 MG 24 hr tablet Take 1 tablet (25 mg total) by mouth daily.  . nitroGLYCERIN (NITROSTAT) 0.4 MG SL tablet place 1 tablet under the tongue every 5 minutes for UP TO 3 doses if needed  . Pitavastatin Calcium (LIVALO) 2 MG TABS Take 1 tablet (2 mg total) by mouth daily.  . valACYclovir (VALTREX) 1000 MG tablet Take 2 tablets at onset of cold sores and repeat  2 in 12 hours.  Marland Kitchen warfarin (COUMADIN) 5 MG tablet take 1 tablet by mouth once daily except 1/2 tablet on Sun and Tues OR AS DIRECTED   No current facility-administered medications for this visit.  (Other)      REVIEW OF SYSTEMS: ROS    Positive for: Eyes   Negative for: Constitutional, Gastrointestinal, Neurological, Skin, Genitourinary, Musculoskeletal, HENT, Endocrine, Cardiovascular, Respiratory, Psychiatric, Allergic/Imm, Heme/Lymph   Last edited by Debbrah Alar, COT on 04/30/2017 10:32 AM. (History)       ALLERGIES Allergies  Allergen Reactions  . Chlorhexidine  Base Itching    CHG WIPES  . Penicillins Hives and Rash    PAST MEDICAL HISTORY Past Medical History:  Diagnosis Date  . ABDOMINAL PAIN RIGHT UPPER QUADRANT 11/08/2008  . ALLERGIC RHINITIS 08/01/2009  . Anticoagulated on Coumadin   . Anxiety   . CAD, NATIVE VESSEL 05/30/2009   moderate risk coronary calcium score by chest CT  . DDD (degenerative disc disease), lumbar   . DEPRESSION 06/13/2008  . DYSLIPIDEMIA 02/27/2009  . DYSPNEA 02/27/2009  . Frequency of urination   . GERD (gastroesophageal reflux disease)   . History of Bell's palsy   . History of DVT of lower extremity 2006-- CHRONIC COUMADIN THERAPY  . History of hiatal hernia   . History of Lyme disease   . 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 Mitral stenosis 11/13/2013   mild by echo  . NON-HODGKIN'S LYMPHOMA, HX OF dx  2005--  chemoradiation completed 2006--  no recurrence   onocologist- dr odogwu--   . OSTEOARTHRITIS, MODERATE 04/07/2010  . Permanent atrial fibrillation ([place])   . Retroperitoneal fibrosis    Past Surgical History:  Procedure Laterality Date  . BREAST MASS EXCISION     BENIGN-- RIGHT BREAST  . CARDIOVASCULAR STRESS TEST  04-04-2009-- PER PT ASYMPTOMATIC-- MEDICAL MANAGEMENT   STUDY WAS EQUIVOCAL/ EF 49%/ GLOBAL HYPOKINESIS/ APPEARS TO BE A MILD ANTERIOR PERFUSION DEFECT COULD REPRESENT ISCHEMIA BUT DUE TO  ACTIVITY WORSE IN STRESS THAN AT REST POSS. THE DEFECT WAS ARTIFACTUAL  . CARDIOVERSION N/A 07/11/2015   Procedure: CARDIOVERSION;  Surgeon: Larey Dresser, MD;  Location: [facility];  Service: Cardiovascular;  Laterality: N/A;  . CT ANGIOGRAM  FEB 2011   CALCIUM SCORE 161/ POSS. MODERATE MID LAD STENOSIS  . CYSTOSCOPY W/ RETROGRADES  04/28/2011   Procedure: CYSTOSCOPY WITH RETROGRADE PYELOGRAM;  Surgeon: Fredricka Bonine, MD;  Location[facility];  Service: Urology;  Laterality: Left;  . CYSTOSCOPY W/ URETERAL STENT REMOVAL  04/28/2011    Procedure: CYSTOSCOPY WITH STENT REMOVAL;  Surgeon: Fredricka Bonine, MD;  Location: [facility];  Service: Urology;;  . EXPLORATORY LAPAROTOMY  2005   LYMPHOMA  . KNEE ARTHROSCOPY  2005   RIGHT  . MULTIPLE CYSTO/ LEFT URETERAL STENT EXCHANGES  LAST ONE 10-28-10  . RETROPERITONEAL BX  2007  . REVERSE SHOULDER ARTHROPLASTY Right 01/31/2015   Procedure: RIGHT REVERSE SHOULDER ARTHROPLASTY;  Surgeon: Justice Britain, MD;  Location: [facility];  Service: Orthopedics;  Laterality: Right;  . TONSILLECTOMY  CHILD  . TOTAL KNEE ARTHROPLASTY  OCT 2010   RIGHT  . TOTAL KNEE ARTHROPLASTY  JAN 2010   LEFT  . TRANSTHORACIC ECHOCARDIOGRAM  [id number]   MILD SYSTOLIC DYSFUNCTION/ EF 83-15%/ MODERATE DIASTOLIC DYSFUCTION/ MILD MR/ MILD BIATRIAL ENLARGEMENT    FAMILY HISTORY Family History  Problem Relation Age of Onset  . Arthritis Mother   . Heart disease Mother   .  Heart attack Mother   . Diabetes Sister   . Hypertension Sister   . Stroke Sister   . Heart attack Sister     SOCIAL HISTORY Social History   Tobacco Use  . Smoking status: Former Smoker    Packs/day: 2.00    Years: 52.00    Pack years: 104.00    Types: Cigarettes    Last attempt to quit: 03/24/2008    Years since quitting: 9.1  . Smokeless tobacco: Never Used  Substance Use Topics  . Alcohol use: Yes    Comment: RARE  . Drug use: No         OPHTHALMIC EXAM:  Base Eye Exam    Visual Acuity (Snellen - Linear)      Right Left   Dist cc 20/400 20/100   Dist ph cc NI 20/80 +2       Tonometry (Tonopen, 10:38 AM)      Right Left   Pressure 14 14       Pupils      Dark Light Shape React APD   Right 5  Round Slow None   Left 5 4 Round Slow None       Neuro/Psych    Oriented x3:  Yes   Mood/Affect:  Normal       Dilation    Both eyes:  1.0% Mydriacyl, 2.5% Phenylephrine @ 10:39 AM        Slit Lamp and Fundus Exam    External Exam      Right Left   External Periorbital edema         Slit Lamp Exam      Right Left   Lids/Lashes Dermatochalasis - upper lid Dermatochalasis - upper lid   Conjunctiva/Sclera Pneumatic Chemosis, Subconjunctival hemorrhage, 3+ Injection White and quiet   Cornea Arcus, 1+ diffuse Punctate epithelial erosions Arcus, 2+ diffuse Punctate epithelial erosions   Anterior Chamber Deep and quiet Deep and quiet   Iris Round and dilated Round and dilated   Lens 3+ Nuclear sclerosis, 2+ Cortical cataract, mild brunescence  3+ Nuclear sclerosis, 2+ Cortical cataract   Vitreous Vitreous syneresis, +tobacco dust, 25-30% gas fill  Posterior vitreous detachment       Fundus Exam      Right Left   Disc Temporal Peripapillary atrophy, otherwise normal    C/D Ratio 0.35 0.3   Macula Trace SRF inferior, macula reattaching     Vessels Vascular attenuation    Periphery bullous ST detachment from 0730 to 1200--improved, small horseshoe tear at 1030, large horseshoe tear at 1100 - good cryo changes surrounding, SRF is improved, pigmented demarcation line along 0730 border           IMAGING AND PROCEDURES  Imaging and Procedures for 04/30/17  OCT, Retina - OU - Both Eyes     Right Eye Quality was borderline. Central Foveal Thickness: 317. Progression has improved. Findings include subretinal fluid, abnormal foveal contour, epiretinal membrane, no IRF.   Left Eye Quality was good. Central Foveal Thickness: 278. Progression has been stable. Findings include normal foveal contour, no IRF, no SRF, retinal drusen , outer retinal atrophy, subretinal hyper-reflective material, epiretinal membrane.   Notes *Images captured and stored on drive  Diagnosis / Impression:  OD: retina re-attaching, trace residual SRF inferiorly  OS: Non-Exudative AMD  Clinical management:  See below  Abbreviations: NFP - Normal foveal profile. CME - cystoid macular edema. PED - pigment epithelial detachment. IRF - intraretinal fluid. SRF - subretinal  fluid. EZ - ellipsoid zone. ERM  - epiretinal membrane. ORA - outer retinal atrophy. ORT - outer retinal tubulation. SRHM - subretinal hyper-reflective material                  ASSESSMENT/PLAN:    ICD-10-CM   1. Right retinal detachment H33.21 OCT, Retina - OU - Both Eyes  2. Retinal edema H35.81   3. Intermediate stage nonexudative age-related macular degeneration of both eyes H35.3132   4. Combined form of age-related cataract, both eyes H25.813     1. Retinal detachment, OD-  - macula- and fovea-involving rhegmatogenous retinal detachment - initial OCT showed IRF/cystic changes suggestive of chronicity of detachment - supero-temporal detachment from 0730 to 1200 with small horseshoe tear located at 1030 and large horseshoe tear located at 1100 - s/p pneumatic cryopexy OD (02.04.19) - good early cryo changes present and SRF vastly improved - pt reports compliance with positioning -- aided by muscle relaxers - continue head postioning - maxitrol ung QID OD - F/U Monday  2. Retinal edema  - as discussed above, detached retina has cystic changes / IRF suggestive of chronic detachment  3. Age related macular degeneration, non-exudative, both eyes  - The incidence, anatomy, and pathology of dry AMD, risk of progression, and the AREDS and AREDS 2 study including smoking risks discussed with patient.  - Recommend amsler grid monitoring  4. Combined form age-related cataract OU-  - The symptoms of cataract, surgical options, and treatments and risks were discussed with patient. - discussed diagnosis and progression - visually significant - recommend cataract evaluation once retina stable / healed   Ophthalmic Meds Ordered this visit:  No orders of the defined types were placed in this encounter.      Return in about 3 days (around 05/03/2017) for s/p pneumatic cryopexy OD (02.04.19).  There are no Patient Instructions on file for this visit.   Explained the diagnoses, plan, and follow up with the  patient and they expressed understanding.  Patient expressed understanding of the importance of proper follow up care.   This document serves as a record of services personally performed by Gardiner Sleeper, MD, PhD. It was created on their behalf by Catha Brow, [place], a certified ophthalmic assistant. The creation of this record is the provider's dictation and/or activities during the visit.  Electronically signed by: Catha Brow, [place]  04/30/17 11:44 AM   Gardiner Sleeper, M.D., Ph.D. Diseases & Surgery of the [facility] [place] 04/30/17   I have reviewed the above documentation for accuracy and completeness, and I agree with the above. Gardiner Sleeper, M.D., Ph.D. 04/30/17 11:50 AM    Abbreviations: M myopia (nearsighted); A astigmatism; H hyperopia (farsighted); P presbyopia; Mrx spectacle prescription;  CTL contact lenses; OD right eye; OS left eye; OU both eyes  XT exotropia; ET esotropia; PEK punctate epithelial keratitis; PEE punctate epithelial erosions; DES dry eye syndrome; MGD meibomian gland dysfunction; ATs artificial tears; PFAT's preservative free artificial tears; [place] nuclear sclerotic cataract; PSC posterior subcapsular cataract; ERM epi-retinal membrane; PVD posterior vitreous detachment; RD retinal detachment; DM diabetes mellitus; DR diabetic retinopathy; NPDR non-proliferative diabetic retinopathy; PDR proliferative diabetic retinopathy; CSME clinically significant macular edema; DME diabetic macular edema; dbh dot blot hemorrhages; CWS cotton wool spot; POAG primary open angle glaucoma; C/D cup-to-disc ratio; HVF humphrey visual field; GVF goldmann visual field; OCT optical coherence tomography; IOP intraocular pressure; BRVO Branch retinal vein occlusion; CRVO central retinal vein  occlusion; CRAO central retinal artery occlusion; BRAO branch retinal artery occlusion; RT retinal tear; SB scleral buckle; PPV pars plana vitrectomy; VH  Vitreous hemorrhage; PRP panretinal laser photocoagulation; IVK intravitreal kenalog; VMT vitreomacular traction; MH Macular hole;  NVD neovascularization of the disc; NVE neovascularization elsewhere; AREDS age related eye disease study; ARMD age related macular degeneration; POAG primary open angle glaucoma; EBMD epithelial/anterior basement membrane dystrophy; ACIOL anterior chamber intraocular lens; IOL intraocular lens; PCIOL posterior chamber intraocular lens; Phaco/IOL phacoemulsification with intraocular lens placement; [place] photorefractive keratectomy; LASIK laser assisted in situ keratomileusis; HTN hypertension; DM diabetes mellitus; COPD chronic obstructive pulmonary disease

## 2017-04-29 NOTE — Telephone Encounter (Signed)
Sent in

## 2017-04-29 NOTE — Telephone Encounter (Signed)
Called in c/o neck pain from having to maintain her head in an over and down position due to retinal detachment surgery 3 days ago.   Her neck and shoulders are hurting.  She can't turn her head and is having difficulty sleeping due to the neck pain.   Her eye doctor told her to call her primary care physician for the neck pain. She can not take NSAIDS or Tylenol.    She mentioned she is unable to come to the office for an appt for this.  She was unable to tell me how long she was to maintain this head position post retinal detachment surgery.   She is going to see the eye doctor again in the morning for follow up.  Her pharmacy is [facility] [address] in  [place].  (new pharmacy for her).  I routed a note to Dr. Erick Blinks nurse pool making them aware of her request and situation. Reason for Disposition . Neck pain  or stiffness  Answer Assessment - Initial Assessment Questions 1. ONSET: "When did the pain begin?"      I had a retinal detachment surgery done and I have to keep my head over and down.   I saw the eye doctor yesterday morning.    The surgery was 3 days ago. 2. LOCATION: "Where does it hurt?"      My shoulders and neck are hurting from the position I'm having having to maintain.   My eye doctor told me to call my primary care doctor for neck pain medicine. 3. PATTERN "Does the pain come and go, or has it been constant since it started?"      I can't turn my head and I can't sleep due to the pain.   I see the eye doctor again tomorrow morning.   4. SEVERITY: "How bad is the pain?"  (Scale 1-10; or mild, moderate, severe)   - MILD (1-3): doesn't interfere with normal activities    - MODERATE (4-7): interferes with normal activities or awakens from sleep    - SEVERE (8-10):  excruciating pain, unable to do any normal activities      8 on pain scale. 5. RADIATION: "Does the pain go anywhere else, shoot into your arms?"     My shoulders because I can't move neck. 6. CORD SYMPTOMS:  "Any weakness or numbness of the arms or legs?"     No 7. CAUSE: "What do you think is causing the neck pain?"     The retinal surgery and maintaining my head in this positon. 8. NECK OVERUSE: "Any recent activities that involved turning or twisting the neck?"     No 9. OTHER SYMPTOMS: "Do you have any other symptoms?" (e.g., headache, fever, chest pain, difficulty breathing, neck swelling)     No 10. PREGNANCY: "Is there any chance you are pregnant?" "When was your last menstrual period?"       Not asked due to age  Protocols used: NECK PAIN OR STIFFNESS-A-AH

## 2017-04-29 NOTE — Telephone Encounter (Signed)
Can you ask [name] if he will review this? You can send back to me and I will call pt.

## 2017-04-30 ENCOUNTER — Encounter (INDEPENDENT_AMBULATORY_CARE_PROVIDER_SITE_OTHER): Payer: Self-pay | Admitting: Ophthalmology

## 2017-04-30 ENCOUNTER — Ambulatory Visit (INDEPENDENT_AMBULATORY_CARE_PROVIDER_SITE_OTHER): Payer: Medicare Other | Admitting: Ophthalmology

## 2017-04-30 DIAGNOSIS — H3321 Serous retinal detachment, right eye: Secondary | ICD-10-CM

## 2017-04-30 DIAGNOSIS — H25813 Combined forms of age-related cataract, bilateral: Secondary | ICD-10-CM

## 2017-04-30 DIAGNOSIS — H353132 Nonexudative age-related macular degeneration, bilateral, intermediate dry stage: Secondary | ICD-10-CM

## 2017-04-30 DIAGNOSIS — H3581 Retinal edema: Secondary | ICD-10-CM

## 2017-05-03 ENCOUNTER — Encounter (INDEPENDENT_AMBULATORY_CARE_PROVIDER_SITE_OTHER): Payer: Medicare Other | Admitting: Ophthalmology

## 2017-05-03 NOTE — Progress Notes (Signed)
[facility] Note  05/04/2017     CHIEF COMPLAINT Patient presents for Post-op Follow-up   HISTORY OF PRESENT ILLNESS: [name] is a 72 y.o. female who presents to the clinic today for:   HPI    Post-op Follow-up    In right eye.  Discomfort includes floaters.  Negative for pain, itching, foreign body sensation, tearing and discharge.  Vision is improved and is blurred at distance.  I, the attending physician,  performed the HPI with the patient and updated documentation appropriately.          Comments    S/P Pneumatic cryopexy OD (04/26/17) Patient states her vision is improving, gas bubble is in lower part of right eye. Pt reports she has one floater OD, Va remains blurry OD. Pt reports she has not maintained head position because she forgets, but she does maintain head position at HS. Pt is using Neomycin OD Qid. Denies vits.       Last edited by Bernarda Caffey, MD on 05/04/2017  2:50 PM. (History)    Pt reports she is having trouble remembering head position and just how long she is to keep position;   Referring physician: [name], MD [place], [place] [id number]  HISTORICAL INFORMATION:   Selected notes from the MEDICAL RECORD NUMBER Referred by Dr. Delman Cheadle for concern of mac off RD LEE- 02.04.19  Ocular Hx-  PMH- A-Fib on coumadin, severe anxiety, CAD, depression    CURRENT MEDICATIONS: Current Outpatient Medications (Ophthalmic Drugs)  Medication Sig  . neomycin-polymyxin b-dexamethasone (MAXITROL) 3.5-10000-0.1 OINT Place 1 application into the right eye 4 (four) times daily.   No current facility-administered medications for this visit.  (Ophthalmic Drugs)   Current Outpatient Medications (Other)  Medication Sig  . ALPRAZolam (XANAX) 1 MG tablet Take 1 tablet (1 mg total) by mouth 3 (three) times daily as needed for anxiety.  Marland Kitchen b complex vitamins capsule Take 2 capsules by mouth daily.   . Biotin 5  MG CAPS Take by mouth.  . Cholecalciferol (VITAMIN D) 2000 UNITS CAPS Take 2,000 Int'l Units by mouth.  . Coenzyme Q10-Fish Oil-Vit E (CO-Q 10 OMEGA-3 FISH OIL PO) Take 100 mg elemental calcium/kg/hr by mouth daily.  . cyclobenzaprine (FLEXERIL) 5 MG tablet Take 1 tablet (5 mg total) by mouth 2 (two) times daily as needed for muscle spasms.  . Diclofenac Sodium (PENNSAID) 2 % SOLN Place 1 application onto the skin 2 (two) times daily as needed.  . diltiazem (CARDIZEM CD) 180 MG 24 hr capsule Take 1 capsule (180 mg total) by mouth 2 (two) times daily.  . DULoxetine (CYMBALTA) 60 MG capsule Take 1 capsule (60 mg total) by mouth daily.  . fluticasone (FLONASE) 50 MCG/ACT nasal spray instill 2 sprays into each nostril once daily (Patient taking differently: instill 2 sprays into each nostril once daily as needed)  . furosemide (LASIX) 20 MG tablet Take 1 tablet (20 mg total) by mouth daily. Please keep upcoming appointment for further refills  . loratadine (CLARITIN) 10 MG tablet Take 10 mg by mouth daily.   Marland Kitchen losartan (COZAAR) 25 MG tablet TAKE 1 TABLET BY MOUTH DAILY  . Lysine 500 MG TABS Take 500 mg by mouth daily.  . metoprolol succinate (TOPROL-XL) 25 MG 24 hr tablet Take 1 tablet (25 mg total) by mouth daily.  . nitroGLYCERIN (NITROSTAT) 0.4 MG SL tablet place 1 tablet under the tongue every 5 minutes for UP  TO 3 doses if needed  . Pitavastatin Calcium (LIVALO) 2 MG TABS Take 1 tablet (2 mg total) by mouth daily.  . valACYclovir (VALTREX) 1000 MG tablet Take 2 tablets at onset of cold sores and repeat 2 in 12 hours.  Marland Kitchen warfarin (COUMADIN) 5 MG tablet take 1 tablet by mouth once daily except 1/2 tablet on Sun and Tues OR AS DIRECTED   No current facility-administered medications for this visit.  (Other)      REVIEW OF SYSTEMS: ROS    Positive for: Cardiovascular, Eyes   Negative for: Constitutional, Gastrointestinal, Neurological, Skin, Genitourinary, Musculoskeletal, HENT, Endocrine,  Respiratory, Psychiatric, Allergic/Imm, Heme/Lymph   Last edited by Zenovia Jordan, LPN on 9/60/4540  9:81 PM. (History)       ALLERGIES Allergies  Allergen Reactions  . Chlorhexidine Base Itching    CHG WIPES  . Penicillins Hives and Rash    PAST MEDICAL HISTORY Past Medical History:  Diagnosis Date  . ABDOMINAL PAIN RIGHT UPPER QUADRANT 11/08/2008  . ALLERGIC RHINITIS 08/01/2009  . Anticoagulated on Coumadin   . Anxiety   . CAD, NATIVE VESSEL 05/30/2009   moderate risk coronary calcium score by chest CT  . DDD (degenerative disc disease), lumbar   . DEPRESSION 06/13/2008  . DYSLIPIDEMIA 02/27/2009  . DYSPNEA 02/27/2009  . Frequency of urination   . GERD (gastroesophageal reflux disease)   . History of Bell's palsy   . History of DVT of lower extremity 2006-- CHRONIC COUMADIN THERAPY  . History of hiatal hernia   . History of Lyme disease   . 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 Mitral stenosis 11/13/2013   mild by echo  . NON-HODGKIN'S LYMPHOMA, HX OF dx  2005--  chemoradiation completed 2006--  no recurrence   onocologist- dr odogwu--   . OSTEOARTHRITIS, MODERATE 04/07/2010  . Permanent atrial fibrillation ([place])   . Retroperitoneal fibrosis    Past Surgical History:  Procedure Laterality Date  . BREAST MASS EXCISION     BENIGN-- RIGHT BREAST  . CARDIOVASCULAR STRESS TEST  04-04-2009-- PER PT ASYMPTOMATIC-- MEDICAL MANAGEMENT   STUDY WAS EQUIVOCAL/ EF 49%/ GLOBAL HYPOKINESIS/ APPEARS TO BE A MILD ANTERIOR PERFUSION DEFECT COULD REPRESENT ISCHEMIA BUT DUE TO  ACTIVITY WORSE IN STRESS THAN AT REST POSS. THE DEFECT WAS ARTIFACTUAL  . CARDIOVERSION N/A 07/11/2015   Procedure: CARDIOVERSION;  Surgeon: Larey Dresser, MD;  Location: [facility];  Service: Cardiovascular;  Laterality: N/A;  . CT ANGIOGRAM  FEB 2011   CALCIUM SCORE 161/ POSS. MODERATE MID LAD STENOSIS  . CYSTOSCOPY W/ RETROGRADES  04/28/2011   Procedure: CYSTOSCOPY WITH  RETROGRADE PYELOGRAM;  Surgeon: Fredricka Bonine, MD;  [facility];  Service: Urology;  Laterality: Left;  . CYSTOSCOPY W/ URETERAL STENT REMOVAL  04/28/2011   Procedure: CYSTOSCOPY WITH STENT REMOVAL;  Surgeon: Fredricka Bonine, MD;  Location[facility];  Service: Urology;;  . EXPLORATORY LAPAROTOMY  2005   LYMPHOMA  . KNEE ARTHROSCOPY  2005   RIGHT  . MULTIPLE CYSTO/ LEFT URETERAL STENT EXCHANGES  LAST ONE 10-28-10  . RETROPERITONEAL BX  2007  . REVERSE SHOULDER ARTHROPLASTY Right 01/31/2015   Procedure: RIGHT REVERSE SHOULDER ARTHROPLASTY;  Surgeon: Justice Britain, MD;  Location: [facility];  Service: Orthopedics;  Laterality: Right;  . TONSILLECTOMY  CHILD  . TOTAL KNEE ARTHROPLASTY  OCT 2010   RIGHT  . TOTAL KNEE ARTHROPLASTY  JAN 2010   LEFT  . TRANSTHORACIC ECHOCARDIOGRAM  [id number]   MILD SYSTOLIC DYSFUNCTION/ EF 79-43%/ MODERATE DIASTOLIC DYSFUCTION/ MILD MR/ MILD BIATRIAL ENLARGEMENT    FAMILY HISTORY Family History  Problem Relation Age of Onset  . Arthritis Mother   . Heart disease Mother   . Heart attack Mother   . Diabetes Sister   . Hypertension Sister   . Stroke Sister   . Heart attack Sister     SOCIAL HISTORY Social History   Tobacco Use  . Smoking status: Former Smoker    Packs/day: 2.00    Years: 52.00    Pack years: 104.00    Types: Cigarettes    Last attempt to quit: 03/24/2008    Years since quitting: 9.1  . Smokeless tobacco: Never Used  Substance Use Topics  . Alcohol use: Yes    Comment: RARE  . Drug use: No         OPHTHALMIC EXAM:  Base Eye Exam    Visual Acuity (Snellen - Linear)      Right Left   Dist cc 20/200 20/80   Dist ph cc NI NI   Correction:  Glasses       Tonometry (Tonopen, 2:33 PM)      Right Left   Pressure 17 21       Pupils      Dark Light Shape React APD   Right 3 2 Round 1 None   Left 3 2 Round Brisk None       Visual Fields (Counting fingers)       Left Right   Restrictions Partial outer superior temporal, inferior temporal, inferior nasal deficiencies Total inferior temporal, superior nasal, inferior nasal deficiencies; Partial outer superior temporal deficiency       Extraocular Movement      Right Left    Full, Ortho Full, Ortho       Neuro/Psych    Oriented x3:  Yes   Mood/Affect:  Normal       Dilation    Both eyes:  1.0% Mydriacyl, Paremyd @ 2:36 PM        Slit Lamp and Fundus Exam    External Exam      Right Left   External Periorbital edema        Slit Lamp Exam      Right Left   Lids/Lashes Dermatochalasis - upper lid Dermatochalasis - upper lid   Conjunctiva/Sclera Pneumatic Chemosis, Subconjunctival hemorrhage, 3+ Injection White and quiet   Cornea Arcus, 1+ diffuse Punctate epithelial erosions Arcus, 2+ diffuse Punctate epithelial erosions   Anterior Chamber Deep and quiet Deep and quiet   Iris Round and dilated Round and dilated   Lens 3+ Nuclear sclerosis, 2+ Cortical cataract, mild brunescence  3+ Nuclear sclerosis, 2+ Cortical cataract   Vitreous Vitreous syneresis, +tobacco dust, 30% gas fill  Posterior vitreous detachment       Fundus Exam      Right Left   Disc Temporal Peripapillary atrophy, otherwise normal    C/D Ratio 0.35 0.3   Macula Trace SRF inferior, macula reattaching     Vessels Vascular attenuation    Periphery bullous ST detachment from 0730 to 1200--improved, small horseshoe tear at 1030, large horseshoe tear at 1100 - good cryo changes surrounding, SRF is improved, pigmented demarcation line along 0730 border           IMAGING AND PROCEDURES  Imaging and Procedures for 05/04/17  OCT, Retina - OU - Both Eyes     Right Eye Quality was  good. Central Foveal Thickness: 257. Progression has improved. Findings include subretinal fluid, epiretinal membrane, no IRF, normal foveal contour.   Left Eye Quality was good. Central Foveal Thickness: 275. Progression has been stable.  Findings include normal foveal contour, no IRF, no SRF, retinal drusen , outer retinal atrophy, subretinal hyper-reflective material, epiretinal membrane.   Notes *Images captured and stored on drive  Diagnosis / Impression:  OD: retina re-attaching, trace residual SRF OS: Non-Exudative AMD  Clinical management:  See below  Abbreviations: NFP - Normal foveal profile. CME - cystoid macular edema. PED - pigment epithelial detachment. IRF - intraretinal fluid. SRF - subretinal fluid. EZ - ellipsoid zone. ERM - epiretinal membrane. ORA - outer retinal atrophy. ORT - outer retinal tubulation. SRHM - subretinal hyper-reflective material                  ASSESSMENT/PLAN:    ICD-10-CM   1. Right retinal detachment H33.21 OCT, Retina - OU - Both Eyes  2. Retinal edema H35.81 OCT, Retina - OU - Both Eyes  3. Intermediate stage nonexudative age-related macular degeneration of both eyes H35.3132   4. Combined form of age-related cataract, both eyes H25.813     1. Retinal detachment, OD-  - macula- and fovea-involving rhegmatogenous retinal detachment - initial OCT showed IRF/cystic changes suggestive of chronicity of detachment - supero-temporal detachment from 0730 to 1200 with small horseshoe tear located at 1030 and large horseshoe tear located at 1100 - s/p pneumatic cryopexy OD (02.04.19) - good early cryo changes present and SRF improving -- sliver of SRF improving under macula - pt reports moderate compliance with positioning - start face down positioning 1/2 time, continue head postioning other half - cont maxitrol ung QID OD - F/U 1 week  2. Retinal edema  - as discussed above, detached retina has cystic changes / IRF suggestive of chronic detachment  3. Age related macular degeneration, non-exudative, both eyes  - The incidence, anatomy, and pathology of dry AMD, risk of progression, and the AREDS and AREDS 2 study including smoking risks discussed with patient.  -  Recommend amsler grid monitoring  4. Combined form age-related cataract OU-  - The symptoms of cataract, surgical options, and treatments and risks were discussed with patient. - discussed diagnosis and progression - visually significant - recommend cataract evaluation once retina stable / healed   Ophthalmic Meds Ordered this visit:  No orders of the defined types were placed in this encounter.      Return in about 1 week (around 05/11/2017) for POV.  There are no Patient Instructions on file for this visit.   Explained the diagnoses, plan, and follow up with the patient and they expressed understanding.  Patient expressed understanding of the importance of proper follow up care.   This document serves as a record of services personally performed by Gardiner Sleeper, MD, PhD. It was created on their behalf by Catha Brow, [place], a certified ophthalmic assistant. The creation of this record is the provider's dictation and/or activities during the visit.  Electronically signed by: Catha Brow, [place]  05/04/17 3:16 PM   Gardiner Sleeper, M.D., Ph.D. Diseases & Surgery of the Retina and [place] 05/04/17   I have reviewed the above documentation for accuracy and completeness, and I agree with the above. Gardiner Sleeper, M.D., Ph.D. 05/04/17 3:18 PM    Abbreviations: M myopia (nearsighted); A astigmatism; H hyperopia (farsighted); P presbyopia; Mrx spectacle prescription;  CTL  contact lenses; OD right eye; OS left eye; OU both eyes  XT exotropia; ET esotropia; PEK punctate epithelial keratitis; PEE punctate epithelial erosions; DES dry eye syndrome; MGD meibomian gland dysfunction; ATs artificial tears; PFAT's preservative free artificial tears; [place] nuclear sclerotic cataract; PSC posterior subcapsular cataract; ERM epi-retinal membrane; PVD posterior vitreous detachment; RD retinal detachment; DM diabetes mellitus; DR diabetic retinopathy; NPDR  non-proliferative diabetic retinopathy; PDR proliferative diabetic retinopathy; CSME clinically significant macular edema; DME diabetic macular edema; dbh dot blot hemorrhages; CWS cotton wool spot; POAG primary open angle glaucoma; C/D cup-to-disc ratio; HVF humphrey visual field; GVF goldmann visual field; OCT optical coherence tomography; IOP intraocular pressure; BRVO Branch retinal vein occlusion; CRVO central retinal vein occlusion; CRAO central retinal artery occlusion; BRAO branch retinal artery occlusion; RT retinal tear; SB scleral buckle; PPV pars plana vitrectomy; VH Vitreous hemorrhage; PRP panretinal laser photocoagulation; IVK intravitreal kenalog; VMT vitreomacular traction; MH Macular hole;  NVD neovascularization of the disc; NVE neovascularization elsewhere; AREDS age related eye disease study; ARMD age related macular degeneration; POAG primary open angle glaucoma; EBMD epithelial/anterior basement membrane dystrophy; ACIOL anterior chamber intraocular lens; IOL intraocular lens; PCIOL posterior chamber intraocular lens; Phaco/IOL phacoemulsification with intraocular lens placement; [place] photorefractive keratectomy; LASIK laser assisted in situ keratomileusis; HTN hypertension; DM diabetes mellitus; COPD chronic obstructive pulmonary disease

## 2017-05-04 ENCOUNTER — Encounter (INDEPENDENT_AMBULATORY_CARE_PROVIDER_SITE_OTHER): Payer: Self-pay | Admitting: Ophthalmology

## 2017-05-04 ENCOUNTER — Ambulatory Visit (INDEPENDENT_AMBULATORY_CARE_PROVIDER_SITE_OTHER): Payer: Medicare Other | Admitting: Ophthalmology

## 2017-05-04 DIAGNOSIS — H3321 Serous retinal detachment, right eye: Secondary | ICD-10-CM

## 2017-05-04 DIAGNOSIS — H3581 Retinal edema: Secondary | ICD-10-CM | POA: Diagnosis not present

## 2017-05-04 DIAGNOSIS — H25813 Combined forms of age-related cataract, bilateral: Secondary | ICD-10-CM

## 2017-05-04 DIAGNOSIS — H353132 Nonexudative age-related macular degeneration, bilateral, intermediate dry stage: Secondary | ICD-10-CM

## 2017-05-10 NOTE — Progress Notes (Signed)
[facility] Note  05/11/2017     CHIEF COMPLAINT Patient presents for Post-op Follow-up   HISTORY OF PRESENT ILLNESS: [name] is a 72 y.o. female who presents to the clinic today for:   HPI    Post-op Follow-up    In right eye.  Discomfort includes Negative for pain, itching, foreign body sensation, tearing, discharge, floaters and none.  Vision is stable.  I, the attending physician,  performed the HPI with the patient and updated documentation appropriately.          Comments    Pt presents today for s/p pneumatic cryopexy OD (02.04.19), pt states VA is stable, but she's having trouble with depth perception so she thinks she might need a patch, pt has lost her ointment, pt states she also cannot keep the head down position, so she has been sleeping on her left side,        Last edited by Bernarda Caffey, MD on 05/11/2017  2:51 PM. (History)    Pt reports she is no longer able to see the gas bubble and "can't believe how good I can see";    Referring physician: [name], [place], [place] [address]  HISTORICAL INFORMATION:   Selected notes from the MEDICAL RECORD NUMBER Referred by Dr. Delman Cheadle for concern of mac off RD LEE- 02.04.19  Ocular Hx-  PMH- A-Fib on coumadin, severe anxiety, CAD, depression    CURRENT MEDICATIONS: Current Outpatient Medications (Ophthalmic Drugs)  Medication Sig  . neomycin-polymyxin b-dexamethasone (MAXITROL) 3.5-10000-0.1 OINT Place 1 application into the right eye 4 (four) times daily.   No current facility-administered medications for this visit.  (Ophthalmic Drugs)   Current Outpatient Medications (Other)  Medication Sig  . ALPRAZolam (XANAX) 1 MG tablet Take 1 tablet (1 mg total) by mouth 3 (three) times daily as needed for anxiety.  Marland Kitchen b complex vitamins capsule Take 2 capsules by mouth daily.   . Biotin 5 MG CAPS Take by mouth.  . Cholecalciferol (VITAMIN D) 2000  UNITS CAPS Take 2,000 Int'l Units by mouth.  . Coenzyme Q10-Fish Oil-Vit E (CO-Q 10 OMEGA-3 FISH OIL PO) Take 100 mg elemental calcium/kg/hr by mouth daily.  . cyclobenzaprine (FLEXERIL) 5 MG tablet Take 1 tablet (5 mg total) by mouth 2 (two) times daily as needed for muscle spasms.  . Diclofenac Sodium (PENNSAID) 2 % SOLN Place 1 application onto the skin 2 (two) times daily as needed.  . diltiazem (CARDIZEM CD) 180 MG 24 hr capsule Take 1 capsule (180 mg total) by mouth 2 (two) times daily.  . DULoxetine (CYMBALTA) 60 MG capsule Take 1 capsule (60 mg total) by mouth daily.  . fluticasone (FLONASE) 50 MCG/ACT nasal spray instill 2 sprays into each nostril once daily (Patient taking differently: instill 2 sprays into each nostril once daily as needed)  . furosemide (LASIX) 20 MG tablet Take 1 tablet (20 mg total) by mouth daily. Please keep upcoming appointment for further refills  . loratadine (CLARITIN) 10 MG tablet Take 10 mg by mouth daily.   Marland Kitchen losartan (COZAAR) 25 MG tablet TAKE 1 TABLET BY MOUTH DAILY  . Lysine 500 MG TABS Take 500 mg by mouth daily.  . metoprolol succinate (TOPROL-XL) 25 MG 24 hr tablet Take 1 tablet (25 mg total) by mouth daily.  . nitroGLYCERIN (NITROSTAT) 0.4 MG SL tablet place 1 tablet under the tongue every 5 minutes for UP TO 3 doses if needed  .  Pitavastatin Calcium (LIVALO) 2 MG TABS Take 1 tablet (2 mg total) by mouth daily.  . valACYclovir (VALTREX) 1000 MG tablet Take 2 tablets at onset of cold sores and repeat 2 in 12 hours.  Marland Kitchen warfarin (COUMADIN) 5 MG tablet take 1 tablet by mouth once daily except 1/2 tablet on Sun and Tues OR AS DIRECTED   No current facility-administered medications for this visit.  (Other)      REVIEW OF SYSTEMS: ROS    Positive for: Cardiovascular, Eyes, Psychiatric   Negative for: Constitutional, Gastrointestinal, Neurological, Skin, Genitourinary, Musculoskeletal, HENT, Endocrine, Respiratory, Allergic/Imm, Heme/Lymph   Last  edited by Debbrah Alar, COT on 05/11/2017  1:56 PM. (History)       ALLERGIES Allergies  Allergen Reactions  . Chlorhexidine Base Itching    CHG WIPES  . Penicillins Hives and Rash    PAST MEDICAL HISTORY Past Medical History:  Diagnosis Date  . ABDOMINAL PAIN RIGHT UPPER QUADRANT 11/08/2008  . ALLERGIC RHINITIS 08/01/2009  . Anticoagulated on Coumadin   . Anxiety   . CAD, NATIVE VESSEL 05/30/2009   moderate risk coronary calcium score by chest CT  . DDD (degenerative disc disease), lumbar   . DEPRESSION 06/13/2008  . DYSLIPIDEMIA 02/27/2009  . DYSPNEA 02/27/2009  . Frequency of urination   . GERD (gastroesophageal reflux disease)   . History of Bell's palsy   . History of DVT of lower extremity 2006-- CHRONIC COUMADIN THERAPY  . History of hiatal hernia   . History of Lyme disease   . 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 Mitral stenosis 11/13/2013   mild by echo  . NON-HODGKIN'S LYMPHOMA, HX OF dx  2005--  chemoradiation completed 2006--  no recurrence   onocologist- dr odogwu--   . OSTEOARTHRITIS, MODERATE 04/07/2010  . Permanent atrial fibrillation ([place])   . Retroperitoneal fibrosis    Past Surgical History:  Procedure Laterality Date  . BREAST MASS EXCISION     BENIGN-- RIGHT BREAST  . CARDIOVASCULAR STRESS TEST  04-04-2009-- PER PT ASYMPTOMATIC-- MEDICAL MANAGEMENT   STUDY WAS EQUIVOCAL/ EF 49%/ GLOBAL HYPOKINESIS/ APPEARS TO BE A MILD ANTERIOR PERFUSION DEFECT COULD REPRESENT ISCHEMIA BUT DUE TO  ACTIVITY WORSE IN STRESS THAN AT REST POSS. THE DEFECT WAS ARTIFACTUAL  . CARDIOVERSION N/A 07/11/2015   Procedure: CARDIOVERSION;  Surgeon: Larey Dresser, MD;  Location: [facility];  Service: Cardiovascular;  Laterality: N/A;  . CT ANGIOGRAM  FEB 2011   CALCIUM SCORE 161/ POSS. MODERATE MID LAD STENOSIS  . CYSTOSCOPY W/ RETROGRADES  04/28/2011   Procedure: CYSTOSCOPY WITH RETROGRADE PYELOGRAM;  Surgeon: Fredricka Bonine, MD;   Location: [facility];  Service: Urology;  Laterality: Left;  . CYSTOSCOPY W/ URETERAL STENT REMOVAL  04/28/2011   Procedure: CYSTOSCOPY WITH STENT REMOVAL;  Surgeon: Fredricka Bonine, MD;  Location: [facility];  Service: Urology;;  . EXPLORATORY LAPAROTOMY  2005   LYMPHOMA  . KNEE ARTHROSCOPY  2005   RIGHT  . MULTIPLE CYSTO/ LEFT URETERAL STENT EXCHANGES  LAST ONE 10-28-10  . RETROPERITONEAL BX  2007  . REVERSE SHOULDER ARTHROPLASTY Right 01/31/2015   Procedure: RIGHT REVERSE SHOULDER ARTHROPLASTY;  Surgeon: Justice Britain, MD;  Location: [facility];  Service: Orthopedics;  Laterality: Right;  . TONSILLECTOMY  CHILD  . TOTAL KNEE ARTHROPLASTY  OCT 2010   RIGHT  . TOTAL KNEE ARTHROPLASTY  JAN 2010   LEFT  . TRANSTHORACIC ECHOCARDIOGRAM  11-03-4816   MILD SYSTOLIC DYSFUNCTION/  EF 81-44%/ MODERATE DIASTOLIC DYSFUCTION/ MILD MR/ MILD BIATRIAL ENLARGEMENT    FAMILY HISTORY Family History  Problem Relation Age of Onset  . Arthritis Mother   . Heart disease Mother   . Heart attack Mother   . Diabetes Sister   . Hypertension Sister   . Stroke Sister   . Heart attack Sister     SOCIAL HISTORY Social History   Tobacco Use  . Smoking status: Former Smoker    Packs/day: 2.00    Years: 52.00    Pack years: 104.00    Types: Cigarettes    Last attempt to quit: 03/24/2008    Years since quitting: 9.1  . Smokeless tobacco: Never Used  Substance Use Topics  . Alcohol use: Yes    Comment: RARE  . Drug use: No         OPHTHALMIC EXAM:  Base Eye Exam    Visual Acuity (Snellen - Linear)      Right Left   Dist cc 20/150 +1 20/70 +2   Dist ph cc NI 20/50 +1       Tonometry (Tonopen, 2:04 PM)      Right Left   Pressure 16 15       Pupils      Dark Light Shape React APD   Right 4 2 Round Brisk None   Left 4 2 Round Brisk None       Visual Fields (Counting fingers)      Left Right    Full Full       Extraocular Movement      Right Left     Full, Ortho Full, Ortho       Neuro/Psych    Oriented x3:  Yes   Mood/Affect:  Normal       Dilation    Both eyes:  1.0% Mydriacyl, 2.5% Phenylephrine @ 2:04 PM        Slit Lamp and Fundus Exam    External Exam      Right Left   External Periorbital edema        Slit Lamp Exam      Right Left   Lids/Lashes Dermatochalasis - upper lid Dermatochalasis - upper lid   Conjunctiva/Sclera 1+ injection; [place] resolved White and quiet   Cornea Arcus, 1+ diffuse Punctate epithelial erosions Arcus, 2+ diffuse Punctate epithelial erosions   Anterior Chamber Deep and quiet Deep and quiet   Iris Round and dilated Round and dilated   Lens 3+ Nuclear sclerosis, 2+ Cortical cataract, mild brunescence  3+ Nuclear sclerosis, 2+ Cortical cataract   Vitreous Vitreous syneresis, 15-20% gas fill  Posterior vitreous detachment       Fundus Exam      Right Left   Disc Temporal Peripapillary atrophy, otherwise normal    C/D Ratio 0.35 0.3   Macula Trace SRF inferior, macula reattaching     Vessels Vascular attenuation    Periphery bullous ST detachment from 0730 to 1200--improved, small horseshoe tear at 1030, large horseshoe tear at 1100 - good cryo changes surrounding, SRF is improved, pigmented demarcation line along 0730 border           IMAGING AND PROCEDURES  Imaging and Procedures for 05/11/17  OCT, Retina - OU - Both Eyes     Right Eye Quality was good. Central Foveal Thickness: 252. Progression has been stable. Findings include subretinal fluid, epiretinal membrane, no IRF, normal foveal contour.   Left Eye Quality was good. Central Foveal Thickness: 277.  Progression has been stable. Findings include normal foveal contour, no IRF, no SRF, retinal drusen , outer retinal atrophy, subretinal hyper-reflective material, epiretinal membrane.   Notes *Images captured and stored on drive  Diagnosis / Impression:  OD: retina re-attaching, trace residual SRF greatest inferior /  temporal OS: Non-Exudative AMD  Clinical management:  See below  Abbreviations: NFP - Normal foveal profile. CME - cystoid macular edema. PED - pigment epithelial detachment. IRF - intraretinal fluid. SRF - subretinal fluid. EZ - ellipsoid zone. ERM - epiretinal membrane. ORA - outer retinal atrophy. ORT - outer retinal tubulation. SRHM - subretinal hyper-reflective material                  ASSESSMENT/PLAN:    ICD-10-CM   1. Right retinal detachment H33.21   2. Retinal edema H35.81 OCT, Retina - OU - Both Eyes  3. Intermediate stage nonexudative age-related macular degeneration of both eyes H35.3132   4. Combined form of age-related cataract, both eyes H25.813     1. Retinal detachment, OD-  - macula- and fovea-involving rhegmatogenous retinal detachment - initial OCT showed IRF/cystic changes suggestive of chronicity of detachment - supero-temporal detachment from 0730 to 1200 with small horseshoe tear located at 1030 and large horseshoe tear located at 1100 - s/p pneumatic cryopexy OD (02.04.19) - good cryo changes present and SRF improving -- sliver of SRF improving under macula - pt reports moderate compliance with positioning - start face down positioning 1/2 time, continue head postioning other half - maxitrol ung prn OD - F/U 1 week  2. Retinal edema  - as discussed above, detached retina has cystic changes / IRF suggestive of chronic detachment -- now improved post pneumatic cryopexy  3. Age related macular degeneration, non-exudative, both eyes  - The incidence, anatomy, and pathology of dry AMD, risk of progression, and the AREDS and AREDS 2 study including smoking risks discussed with patient.  - Recommend amsler grid monitoring  4. Combined form age-related cataract OU-  - The symptoms of cataract, surgical options, and treatments and risks were discussed with patient. - discussed diagnosis and progression - visually significant - recommend cataract  evaluation once retina stable / healed   Ophthalmic Meds Ordered this visit:  No orders of the defined types were placed in this encounter.      Return in about 1 week (around 05/18/2017) for S/P pneumatic cryopexy OD (02.04.19).  There are no Patient Instructions on file for this visit.   Explained the diagnoses, plan, and follow up with the patient and they expressed understanding.  Patient expressed understanding of the importance of proper follow up care.   This document serves as a record of services personally performed by Gardiner Sleeper, MD, PhD. It was created on their behalf by Catha Brow, [place], a certified ophthalmic assistant. The creation of this record is the provider's dictation and/or activities during the visit.  Electronically signed by: Catha Brow, [place]  05/11/17 4:50 PM   Gardiner Sleeper, M.D., Ph.D. Diseases & Surgery of the Retina and Vitreous [facility] 05/11/17   I have reviewed the above documentation for accuracy and completeness, and I agree with the above. Gardiner Sleeper, M.D., Ph.D. 05/11/17 4:50 PM     Abbreviations: M myopia (nearsighted); A astigmatism; H hyperopia (farsighted); P presbyopia; Mrx spectacle prescription;  CTL contact lenses; OD right eye; OS left eye; OU both eyes  XT exotropia; ET esotropia; PEK punctate epithelial keratitis; PEE punctate epithelial  erosions; DES dry eye syndrome; MGD meibomian gland dysfunction; ATs artificial tears; PFAT's preservative free artificial tears; [place] nuclear sclerotic cataract; PSC posterior subcapsular cataract; ERM epi-retinal membrane; PVD posterior vitreous detachment; RD retinal detachment; DM diabetes mellitus; DR diabetic retinopathy; NPDR non-proliferative diabetic retinopathy; PDR proliferative diabetic retinopathy; CSME clinically significant macular edema; DME diabetic macular edema; dbh dot blot hemorrhages; CWS cotton wool spot; POAG primary open angle  glaucoma; C/D cup-to-disc ratio; HVF humphrey visual field; GVF goldmann visual field; OCT optical coherence tomography; IOP intraocular pressure; BRVO Branch retinal vein occlusion; CRVO central retinal vein occlusion; CRAO central retinal artery occlusion; BRAO branch retinal artery occlusion; RT retinal tear; SB scleral buckle; PPV pars plana vitrectomy; VH Vitreous hemorrhage; PRP panretinal laser photocoagulation; IVK intravitreal kenalog; VMT vitreomacular traction; MH Macular hole;  NVD neovascularization of the disc; NVE neovascularization elsewhere; AREDS age related eye disease study; ARMD age related macular degeneration; POAG primary open angle glaucoma; EBMD epithelial/anterior basement membrane dystrophy; ACIOL anterior chamber intraocular lens; IOL intraocular lens; PCIOL posterior chamber intraocular lens; Phaco/IOL phacoemulsification with intraocular lens placement; [place] photorefractive keratectomy; LASIK laser assisted in situ keratomileusis; HTN hypertension; DM diabetes mellitus; COPD chronic obstructive pulmonary disease

## 2017-05-11 ENCOUNTER — Ambulatory Visit (INDEPENDENT_AMBULATORY_CARE_PROVIDER_SITE_OTHER): Payer: Medicare Other | Admitting: Ophthalmology

## 2017-05-11 ENCOUNTER — Encounter (INDEPENDENT_AMBULATORY_CARE_PROVIDER_SITE_OTHER): Payer: Self-pay | Admitting: Ophthalmology

## 2017-05-11 DIAGNOSIS — H3321 Serous retinal detachment, right eye: Secondary | ICD-10-CM

## 2017-05-11 DIAGNOSIS — H25813 Combined forms of age-related cataract, bilateral: Secondary | ICD-10-CM

## 2017-05-11 DIAGNOSIS — H3581 Retinal edema: Secondary | ICD-10-CM

## 2017-05-11 DIAGNOSIS — H353132 Nonexudative age-related macular degeneration, bilateral, intermediate dry stage: Secondary | ICD-10-CM

## 2017-05-12 ENCOUNTER — Telehealth: Payer: Self-pay | Admitting: *Deleted

## 2017-05-12 ENCOUNTER — Other Ambulatory Visit: Payer: Self-pay | Admitting: Cardiology

## 2017-05-12 NOTE — Telephone Encounter (Signed)
[facility] - [place] Refill request ALPRAZolam Duanne Moron) 1 MG tablet Last refill 04/16/17 and last office 01/15/17.  Okay to fill?

## 2017-05-12 NOTE — Telephone Encounter (Signed)
Was refilled 04-16-17 #[phone] with 2 refills.  Should not need refills yet.

## 2017-05-14 NOTE — Telephone Encounter (Signed)
Spoke with pharmacist and refill will be ready for pick up 05/16/17.

## 2017-05-14 NOTE — Progress Notes (Deleted)
[facility] Note  05/19/2017     CHIEF COMPLAINT Patient presents for No chief complaint on file.   HISTORY OF PRESENT ILLNESS: [name] is a 72 y.o. female who presents to the clinic today for:   Pt reports she is no longer able to see the gas bubble and "can't believe how good I can see";    Referring physician: [name], MD [place], [place] [address]  HISTORICAL INFORMATION:   Selected notes from the MEDICAL RECORD NUMBER Referred by Dr. Delman Cheadle for concern of mac off RD LEE- 02.04.19  Ocular Hx-  PMH- A-Fib on coumadin, severe anxiety, CAD, depression    CURRENT MEDICATIONS: Current Outpatient Medications (Ophthalmic Drugs)  Medication Sig   neomycin-polymyxin b-dexamethasone (MAXITROL) 3.5-10000-0.1 OINT Place 1 application into the right eye 4 (four) times daily.   No current facility-administered medications for this visit.  (Ophthalmic Drugs)   Current Outpatient Medications (Other)  Medication Sig   ALPRAZolam (XANAX) 1 MG tablet Take 1 tablet (1 mg total) by mouth 3 (three) times daily as needed for anxiety.   b complex vitamins capsule Take 2 capsules by mouth daily.    Biotin 5 MG CAPS Take by mouth.   Cholecalciferol (VITAMIN D) 2000 UNITS CAPS Take 2,000 Int'l Units by mouth.   Coenzyme Q10-Fish Oil-Vit E (CO-Q 10 OMEGA-3 FISH OIL PO) Take 100 mg elemental calcium/kg/hr by mouth daily.   cyclobenzaprine (FLEXERIL) 5 MG tablet Take 1 tablet (5 mg total) by mouth 2 (two) times daily as needed for muscle spasms.   Diclofenac Sodium (PENNSAID) 2 % SOLN Place 1 application onto the skin 2 (two) times daily as needed.   diltiazem (CARDIZEM CD) 180 MG 24 hr capsule Take 1 capsule (180 mg total) by mouth 2 (two) times daily.   DULoxetine (CYMBALTA) 60 MG capsule Take 1 capsule (60 mg total) by mouth daily.   fluticasone (FLONASE) 50 MCG/ACT nasal spray instill 2 sprays into each nostril once  daily (Patient taking differently: instill 2 sprays into each nostril once daily as needed)   furosemide (LASIX) 20 MG tablet Take 1 tablet (20 mg total) by mouth daily. Please keep upcoming appointment for further refills   LIVALO 2 MG TABS TAKE 1 TABLET EVERY DAY   loratadine (CLARITIN) 10 MG tablet Take 10 mg by mouth daily.    losartan (COZAAR) 25 MG tablet TAKE 1 TABLET BY MOUTH DAILY   Lysine 500 MG TABS Take 500 mg by mouth daily.   metoprolol succinate (TOPROL-XL) 25 MG 24 hr tablet Take 1 tablet (25 mg total) by mouth daily.   nitroGLYCERIN (NITROSTAT) 0.4 MG SL tablet place 1 tablet under the tongue every 5 minutes for UP TO 3 doses if needed   valACYclovir (VALTREX) 1000 MG tablet Take 2 tablets at onset of cold sores and repeat 2 in 12 hours.   warfarin (COUMADIN) 5 MG tablet TAKE 1 TABLET EVERY DAY EXCEPT 1/2 TABLET ON SUNDAY AND TUESDAYS OR AS DIRECTED   No current facility-administered medications for this visit.  (Other)      REVIEW OF SYSTEMS:    ALLERGIES Allergies  Allergen Reactions   Chlorhexidine Base Itching    CHG WIPES   Penicillins Hives and Rash    PAST MEDICAL HISTORY Past Medical History:  Diagnosis Date   ABDOMINAL PAIN RIGHT UPPER QUADRANT 11/08/2008   ALLERGIC RHINITIS 08/01/2009   Anticoagulated on Coumadin    Anxiety  CAD, NATIVE VESSEL 05/30/2009   moderate risk coronary calcium score by chest CT   DDD (degenerative disc disease), lumbar    DEPRESSION 06/13/2008   DYSLIPIDEMIA 02/27/2009   DYSPNEA 02/27/2009   Frequency of urination    GERD (gastroesophageal reflux disease)    History of Bell's palsy    History of DVT of lower extremity 2006-- CHRONIC COUMADIN THERAPY   History of hiatal hernia    History of Lyme disease    Hydronephrosis, left chronic -- secondary to retroperitoneal fibrosis   Mitral stenosis 11/13/2013   mild by echo   NON-HODGKIN'S LYMPHOMA, HX OF dx  2005--  chemoradiation completed  2006--  no recurrence   onocologist- dr odogwu--    OSTEOARTHRITIS, MODERATE 04/07/2010   Permanent atrial fibrillation ([place])    Retroperitoneal fibrosis    Past Surgical History:  Procedure Laterality Date   BREAST MASS EXCISION     BENIGN-- RIGHT BREAST   CARDIOVASCULAR STRESS TEST  04-04-2009-- PER PT ASYMPTOMATIC-- MEDICAL MANAGEMENT   STUDY WAS EQUIVOCAL/ EF 49%/ GLOBAL HYPOKINESIS/ APPEARS TO BE A MILD ANTERIOR PERFUSION DEFECT COULD REPRESENT ISCHEMIA BUT DUE TO  ACTIVITY WORSE IN STRESS THAN AT REST POSS. THE DEFECT WAS ARTIFACTUAL   CARDIOVERSION N/A 07/11/2015   Procedure: CARDIOVERSION;  Surgeon: Larey Dresser, MD;  Location: [facility];  Service: Cardiovascular;  Laterality: N/A;   CT ANGIOGRAM  FEB 2011   CALCIUM SCORE 161/ POSS. MODERATE MID LAD STENOSIS   CYSTOSCOPY W/ RETROGRADES  04/28/2011   Procedure: CYSTOSCOPY WITH RETROGRADE PYELOGRAM;  Surgeon: Fredricka Bonine, MD;  Location: [facility];  Service: Urology;  Laterality: Left;   CYSTOSCOPY W/ URETERAL STENT REMOVAL  04/28/2011   Procedure: CYSTOSCOPY WITH STENT REMOVAL;  Surgeon: Fredricka Bonine, MD;  Location: [facility];  Service: Urology;;   EXPLORATORY LAPAROTOMY  2005   LYMPHOMA   KNEE ARTHROSCOPY  2005   RIGHT   MULTIPLE CYSTO/ LEFT URETERAL STENT EXCHANGES  LAST ONE 10-28-10   RETROPERITONEAL BX  2007   REVERSE SHOULDER ARTHROPLASTY Right 01/31/2015   Procedure: RIGHT REVERSE SHOULDER ARTHROPLASTY;  Surgeon: Justice Britain, MD;  Location: [facility];  Service: Orthopedics;  Laterality: Right;   TONSILLECTOMY  CHILD   TOTAL KNEE ARTHROPLASTY  OCT 2010   RIGHT   TOTAL KNEE ARTHROPLASTY  JAN 2010   LEFT   TRANSTHORACIC ECHOCARDIOGRAM  [id number]   MILD SYSTOLIC DYSFUNCTION/ EF 76-72%/ MODERATE DIASTOLIC DYSFUCTION/ MILD MR/ MILD BIATRIAL ENLARGEMENT    FAMILY HISTORY Family History  Problem Relation Age of Onset   Arthritis Mother    Heart  disease Mother    Heart attack Mother    Diabetes Sister    Hypertension Sister    Stroke Sister    Heart attack Sister     SOCIAL HISTORY Social History   Tobacco Use   Smoking status: Former Smoker    Packs/day: 2.00    Years: 52.00    Pack years: 104.00    Types: Cigarettes    Last attempt to quit: 03/24/2008    Years since quitting: 9.1   Smokeless tobacco: Never Used  Substance Use Topics   Alcohol use: Yes    Comment: RARE   Drug use: No         OPHTHALMIC EXAM:   Not recorded      IMAGING AND PROCEDURES  Imaging and Procedures for 05/14/17           ASSESSMENT/PLAN:    ICD-10-CM  1. Right retinal detachment H33.21   2. Retinal edema H35.81   3. Intermediate stage nonexudative age-related macular degeneration of both eyes H35.3132   4. Combined form of age-related cataract, both eyes H25.813     1. Retinal detachment, OD-  - macula- and fovea-involving rhegmatogenous retinal detachment - initial OCT showed IRF/cystic changes suggestive of chronicity of detachment - supero-temporal detachment from 0730 to 1200 with small horseshoe tear located at 1030 and large horseshoe tear located at 1100 - s/p pneumatic cryopexy OD (02.04.19) - good cryo changes present and SRF improving -- sliver of SRF improving under macula - pt reports moderate compliance with positioning - start face down positioning 1/2 time, continue head postioning other half - maxitrol ung prn OD - F/U 1 week  2. Retinal edema  - as discussed above, detached retina has cystic changes / IRF suggestive of chronic detachment -- now improved post pneumatic cryopexy  3. Age related macular degeneration, non-exudative, both eyes  - The incidence, anatomy, and pathology of dry AMD, risk of progression, and the AREDS and AREDS 2 study including smoking risks discussed with patient.  - Recommend amsler grid monitoring  4. Combined form age-related cataract OU-  - The symptoms of  cataract, surgical options, and treatments and risks were discussed with patient. - discussed diagnosis and progression - visually significant - recommend cataract evaluation once retina stable / healed   Ophthalmic Meds Ordered this visit:  No orders of the defined types were placed in this encounter.      No Follow-up on file.  There are no Patient Instructions on file for this visit.   Explained the diagnoses, plan, and follow up with the patient and they expressed understanding.  Patient expressed understanding of the importance of proper follow up care.   This document serves as a record of services personally performed by Gardiner Sleeper, MD, PhD. It was created on their behalf by Catha Brow, [place], a certified ophthalmic assistant. The creation of this record is the provider's dictation and/or activities during the visit.  Electronically signed by: Catha Brow, [place]  05/14/17 12:23 PM   Gardiner Sleeper, M.D., Ph.D. Diseases & Surgery of the Retina and Vitreous [facility] 05/14/17      Abbreviations: M myopia (nearsighted); A astigmatism; H hyperopia (farsighted); P presbyopia; Mrx spectacle prescription;  CTL contact lenses; OD right eye; OS left eye; OU both eyes  XT exotropia; ET esotropia; PEK punctate epithelial keratitis; PEE punctate epithelial erosions; DES dry eye syndrome; MGD meibomian gland dysfunction; ATs artificial tears; PFAT's preservative free artificial tears; [place] nuclear sclerotic cataract; PSC posterior subcapsular cataract; ERM epi-retinal membrane; PVD posterior vitreous detachment; RD retinal detachment; DM diabetes mellitus; DR diabetic retinopathy; NPDR non-proliferative diabetic retinopathy; PDR proliferative diabetic retinopathy; CSME clinically significant macular edema; DME diabetic macular edema; dbh dot blot hemorrhages; CWS cotton wool spot; POAG primary open angle glaucoma; C/D cup-to-disc ratio; HVF humphrey  visual field; GVF goldmann visual field; OCT optical coherence tomography; IOP intraocular pressure; BRVO Branch retinal vein occlusion; CRVO central retinal vein occlusion; CRAO central retinal artery occlusion; BRAO branch retinal artery occlusion; RT retinal tear; SB scleral buckle; PPV pars plana vitrectomy; VH Vitreous hemorrhage; PRP panretinal laser photocoagulation; IVK intravitreal kenalog; VMT vitreomacular traction; MH Macular hole;  NVD neovascularization of the disc; NVE neovascularization elsewhere; AREDS age related eye disease study; ARMD age related macular degeneration; POAG primary open angle glaucoma; EBMD epithelial/anterior basement membrane dystrophy; ACIOL anterior chamber intraocular  lens; IOL intraocular lens; PCIOL posterior chamber intraocular lens; Phaco/IOL phacoemulsification with intraocular lens placement; [place] photorefractive keratectomy; LASIK laser assisted in situ keratomileusis; HTN hypertension; DM diabetes mellitus; COPD chronic obstructive pulmonary disease

## 2017-05-19 ENCOUNTER — Encounter (INDEPENDENT_AMBULATORY_CARE_PROVIDER_SITE_OTHER): Payer: Medicare Other | Admitting: Ophthalmology

## 2017-05-20 NOTE — Telephone Encounter (Signed)
Patient said she picked up the Xanax prescription a couple of days ago but now she cannot find the prescription.  She wants to know if a new prescription can be sent in.  She doesn't mind paying for it.  She called her son over to help look for it, but he can't find it either.  She knows a person has to be weaned off of this type of medication and she has only taken a couple of tablets from the new prescription.  Please advise. Please call [phone]

## 2017-05-21 NOTE — Telephone Encounter (Signed)
It is her responsibility not to lose controlled medication.  That being said, there is risk of abrupt withdrawal.  What I am willing to do is prescribe lower dose and have her taper off.  I do not think this medication is in her best long term interest.  If she is unwilling to taper we will not recommend refills.  If she is willing, then I suggest  Xanax 0.5 mg one po tid for two weeks, then one po bid for two weeks, and then one daily for two weeks and then d/c.

## 2017-05-21 NOTE — Telephone Encounter (Signed)
Pt called the [facility] and left a message that she was "coming in to complain" because she had not gotten her refill request handled.   FYI.

## 2017-05-21 NOTE — Telephone Encounter (Signed)
Spoke with patient and she found her bottle of Xanax.  Refill was not called in.

## 2017-05-28 NOTE — Progress Notes (Deleted)
[facility] Note  05/31/2017     CHIEF COMPLAINT Patient presents for No chief complaint on file.   HISTORY OF PRESENT ILLNESS: [name] is a 72 y.o. female who presents to the clinic today for:   Pt reports she is no longer able to see the gas bubble and "can't believe how good I can see";    Referring physician: [name], MD [place], [place] [address]  HISTORICAL INFORMATION:   Selected notes from the MEDICAL RECORD NUMBER Referred by Dr. Delman Cheadle for concern of mac off RD LEE- 02.04.19  Ocular Hx-  PMH- A-Fib on coumadin, severe anxiety, CAD, depression    CURRENT MEDICATIONS: Current Outpatient Medications (Ophthalmic Drugs)  Medication Sig   neomycin-polymyxin b-dexamethasone (MAXITROL) 3.5-10000-0.1 OINT Place 1 application into the right eye 4 (four) times daily.   No current facility-administered medications for this visit.  (Ophthalmic Drugs)   Current Outpatient Medications (Other)  Medication Sig   ALPRAZolam (XANAX) 1 MG tablet Take 1 tablet (1 mg total) by mouth 3 (three) times daily as needed for anxiety.   b complex vitamins capsule Take 2 capsules by mouth daily.    Biotin 5 MG CAPS Take by mouth.   Cholecalciferol (VITAMIN D) 2000 UNITS CAPS Take 2,000 Int'l Units by mouth.   Coenzyme Q10-Fish Oil-Vit E (CO-Q 10 OMEGA-3 FISH OIL PO) Take 100 mg elemental calcium/kg/hr by mouth daily.   cyclobenzaprine (FLEXERIL) 5 MG tablet Take 1 tablet (5 mg total) by mouth 2 (two) times daily as needed for muscle spasms.   Diclofenac Sodium (PENNSAID) 2 % SOLN Place 1 application onto the skin 2 (two) times daily as needed.   diltiazem (CARDIZEM CD) 180 MG 24 hr capsule Take 1 capsule (180 mg total) by mouth 2 (two) times daily.   DULoxetine (CYMBALTA) 60 MG capsule Take 1 capsule (60 mg total) by mouth daily.   fluticasone (FLONASE) 50 MCG/ACT nasal spray instill 2 sprays into each nostril once  daily (Patient taking differently: instill 2 sprays into each nostril once daily as needed)   furosemide (LASIX) 20 MG tablet Take 1 tablet (20 mg total) by mouth daily. Please keep upcoming appointment for further refills   LIVALO 2 MG TABS TAKE 1 TABLET EVERY DAY   loratadine (CLARITIN) 10 MG tablet Take 10 mg by mouth daily.    losartan (COZAAR) 25 MG tablet TAKE 1 TABLET BY MOUTH DAILY   Lysine 500 MG TABS Take 500 mg by mouth daily.   metoprolol succinate (TOPROL-XL) 25 MG 24 hr tablet Take 1 tablet (25 mg total) by mouth daily.   nitroGLYCERIN (NITROSTAT) 0.4 MG SL tablet place 1 tablet under the tongue every 5 minutes for UP TO 3 doses if needed   valACYclovir (VALTREX) 1000 MG tablet Take 2 tablets at onset of cold sores and repeat 2 in 12 hours.   warfarin (COUMADIN) 5 MG tablet TAKE 1 TABLET EVERY DAY EXCEPT 1/2 TABLET ON SUNDAY AND TUESDAYS OR AS DIRECTED   No current facility-administered medications for this visit.  (Other)      REVIEW OF SYSTEMS:    ALLERGIES Allergies  Allergen Reactions   Chlorhexidine Base Itching    CHG WIPES   Penicillins Hives and Rash    PAST MEDICAL HISTORY Past Medical History:  Diagnosis Date   ABDOMINAL PAIN RIGHT UPPER QUADRANT 11/08/2008   ALLERGIC RHINITIS 08/01/2009   Anticoagulated on Coumadin    Anxiety  CAD, NATIVE VESSEL 05/30/2009   moderate risk coronary calcium score by chest CT   DDD (degenerative disc disease), lumbar    DEPRESSION 06/13/2008   DYSLIPIDEMIA 02/27/2009   DYSPNEA 02/27/2009   Frequency of urination    GERD (gastroesophageal reflux disease)    History of Bell's palsy    History of DVT of lower extremity 2006-- CHRONIC COUMADIN THERAPY   History of hiatal hernia    History of Lyme disease    Hydronephrosis, left chronic -- secondary to retroperitoneal fibrosis   Mitral stenosis 11/13/2013   mild by echo   NON-HODGKIN'S LYMPHOMA, HX OF dx  2005--  chemoradiation completed  2006--  no recurrence   onocologist- dr odogwu--    OSTEOARTHRITIS, MODERATE 04/07/2010   Permanent atrial fibrillation ([place])    Retroperitoneal fibrosis    Past Surgical History:  Procedure Laterality Date   BREAST MASS EXCISION     BENIGN-- RIGHT BREAST   CARDIOVASCULAR STRESS TEST  04-04-2009-- PER PT ASYMPTOMATIC-- MEDICAL MANAGEMENT   STUDY WAS EQUIVOCAL/ EF 49%/ GLOBAL HYPOKINESIS/ APPEARS TO BE A MILD ANTERIOR PERFUSION DEFECT COULD REPRESENT ISCHEMIA BUT DUE TO  ACTIVITY WORSE IN STRESS THAN AT REST POSS. THE DEFECT WAS ARTIFACTUAL   CARDIOVERSION N/A 07/11/2015   Procedure: CARDIOVERSION;  Surgeon: Larey Dresser, MD;  Location: [facility];  Service: Cardiovascular;  Laterality: N/A;   CT ANGIOGRAM  FEB 2011   CALCIUM SCORE 161/ POSS. MODERATE MID LAD STENOSIS   CYSTOSCOPY W/ RETROGRADES  04/28/2011   Procedure: CYSTOSCOPY WITH RETROGRADE PYELOGRAM;  Surgeon: Fredricka Bonine, MD;  [facility];  Service: Urology;  Laterality: Left;   CYSTOSCOPY W/ URETERAL STENT REMOVAL  04/28/2011   Procedure: CYSTOSCOPY WITH STENT REMOVAL;  Surgeon: Fredricka Bonine, MD;  Location: [facility];  Service: Urology;;   EXPLORATORY LAPAROTOMY  2005   LYMPHOMA   KNEE ARTHROSCOPY  2005   RIGHT   MULTIPLE CYSTO/ LEFT URETERAL STENT EXCHANGES  LAST ONE 10-28-10   RETROPERITONEAL BX  2007   REVERSE SHOULDER ARTHROPLASTY Right 01/31/2015   Procedure: RIGHT REVERSE SHOULDER ARTHROPLASTY;  Surgeon: Justice Britain, MD;  Location: [facility];  Service: Orthopedics;  Laterality: Right;   TONSILLECTOMY  CHILD   TOTAL KNEE ARTHROPLASTY  OCT 2010   RIGHT   TOTAL KNEE ARTHROPLASTY  JAN 2010   LEFT   TRANSTHORACIC ECHOCARDIOGRAM  [id number]   MILD SYSTOLIC DYSFUNCTION/ EF 17-49%/ MODERATE DIASTOLIC DYSFUCTION/ MILD MR/ MILD BIATRIAL ENLARGEMENT    FAMILY HISTORY Family History  Problem Relation Age of Onset   Arthritis Mother    Heart  disease Mother    Heart attack Mother    Diabetes Sister    Hypertension Sister    Stroke Sister    Heart attack Sister     SOCIAL HISTORY Social History   Tobacco Use   Smoking status: Former Smoker    Packs/day: 2.00    Years: 52.00    Pack years: 104.00    Types: Cigarettes    Last attempt to quit: 03/24/2008    Years since quitting: 9.1   Smokeless tobacco: Never Used  Substance Use Topics   Alcohol use: Yes    Comment: RARE   Drug use: No         OPHTHALMIC EXAM:   Not recorded      IMAGING AND PROCEDURES  Imaging and Procedures for 05/28/17           ASSESSMENT/PLAN:    ICD-10-CM  1. Right retinal detachment H33.21   2. Retinal edema H35.81 OCT, Retina - OU - Both Eyes  3. Intermediate stage nonexudative age-related macular degeneration of both eyes H35.3132   4. Combined form of age-related cataract, both eyes H25.813     1. Retinal detachment, OD-  - macula- and fovea-involving rhegmatogenous retinal detachment - initial OCT showed IRF/cystic changes suggestive of chronicity of detachment - supero-temporal detachment from 0730 to 1200 with small horseshoe tear located at 1030 and large horseshoe tear located at 1100 - s/p pneumatic cryopexy OD (02.04.19) - good cryo changes present and SRF improving -- sliver of SRF improving under macula - pt reports moderate compliance with positioning - start face down positioning 1/2 time, continue head postioning other half - maxitrol ung prn OD - F/U 1 week  2. Retinal edema  - as discussed above, detached retina has cystic changes / IRF suggestive of chronic detachment -- now improved post pneumatic cryopexy  3. Age related macular degeneration, non-exudative, both eyes  - The incidence, anatomy, and pathology of dry AMD, risk of progression, and the AREDS and AREDS 2 study including smoking risks discussed with patient.  - Recommend amsler grid monitoring  4. Combined form age-related  cataract OU-  - The symptoms of cataract, surgical options, and treatments and risks were discussed with patient. - discussed diagnosis and progression - visually significant - recommend cataract evaluation once retina stable / healed   Ophthalmic Meds Ordered this visit:  No orders of the defined types were placed in this encounter.      No Follow-up on file.  There are no Patient Instructions on file for this visit.   Explained the diagnoses, plan, and follow up with the patient and they expressed understanding.  Patient expressed understanding of the importance of proper follow up care.   This document serves as a record of services personally performed by Gardiner Sleeper, MD, PhD. It was created on their behalf by Catha Brow, [place], a certified ophthalmic assistant. The creation of this record is the provider's dictation and/or activities during the visit.  Electronically signed by: Catha Brow, [place]  05/28/17 7:50 PM   Gardiner Sleeper, M.D., Ph.D. Diseases & Surgery of the Retina and [place] 05/28/17    Abbreviations: M myopia (nearsighted); A astigmatism; H hyperopia (farsighted); P presbyopia; Mrx spectacle prescription;  CTL contact lenses; OD right eye; OS left eye; OU both eyes  XT exotropia; ET esotropia; PEK punctate epithelial keratitis; PEE punctate epithelial erosions; DES dry eye syndrome; MGD meibomian gland dysfunction; ATs artificial tears; PFAT's preservative free artificial tears; [place] nuclear sclerotic cataract; PSC posterior subcapsular cataract; ERM epi-retinal membrane; PVD posterior vitreous detachment; RD retinal detachment; DM diabetes mellitus; DR diabetic retinopathy; NPDR non-proliferative diabetic retinopathy; PDR proliferative diabetic retinopathy; CSME clinically significant macular edema; DME diabetic macular edema; dbh dot blot hemorrhages; CWS cotton wool spot; POAG primary open angle glaucoma; C/D  cup-to-disc ratio; HVF humphrey visual field; GVF goldmann visual field; OCT optical coherence tomography; IOP intraocular pressure; BRVO Branch retinal vein occlusion; CRVO central retinal vein occlusion; CRAO central retinal artery occlusion; BRAO branch retinal artery occlusion; RT retinal tear; SB scleral buckle; PPV pars plana vitrectomy; VH Vitreous hemorrhage; PRP panretinal laser photocoagulation; IVK intravitreal kenalog; VMT vitreomacular traction; MH Macular hole;  NVD neovascularization of the disc; NVE neovascularization elsewhere; AREDS age related eye disease study; ARMD age related macular degeneration; POAG primary open angle glaucoma; EBMD epithelial/anterior basement membrane dystrophy;  ACIOL anterior chamber intraocular lens; IOL intraocular lens; PCIOL posterior chamber intraocular lens; Phaco/IOL phacoemulsification with intraocular lens placement; [place] photorefractive keratectomy; LASIK laser assisted in situ keratomileusis; HTN hypertension; DM diabetes mellitus; COPD chronic obstructive pulmonary disease

## 2017-05-31 ENCOUNTER — Encounter (INDEPENDENT_AMBULATORY_CARE_PROVIDER_SITE_OTHER): Payer: Medicare Other | Admitting: Ophthalmology

## 2017-06-03 NOTE — Progress Notes (Signed)
[facility]  06/04/2017     CHIEF COMPLAINT Patient presents for Post-op Follow-up   HISTORY OF PRESENT ILLNESS: [name] is a 72 y.o. female who presents to the clinic today for:   HPI    Post-op Follow-up    In right eye.  Discomfort includes itching and floaters.  Negative for pain, foreign body sensation, tearing, discharge and none.  Vision is stable.          Comments    Pt presents today for s/p pneumatic cryopexy OD (02.04.19), pt states VA is better since last visit, states she is seeing floaters and experiencing itching OD, but denies flashes, pain or wavy vision, pt states she did fall asleep on her back a couple times, but not for long, pt states she also rubbed her eye several times, pt states she cannot see the gas bubble anymore, pt states she feels like something is blocked her vision now-maybe her cataract-pt feels like the amount of light she is receiving is different in each eye,        Last edited by Cherrie Gauze on 06/04/2017  1:37 PM. (History)    Pt states she has not been able to follow up due to caring for her son; Pt states gas bubble disappeared x 2-3 days ago; Pt endorses she found herself on her back approx 2 days, pt states she had not been keeping head position as directed;   Referring physician: [name], MD [place], [place] [address]  HISTORICAL INFORMATION:   Selected notes from the MEDICAL RECORD NUMBER Referred by Dr. Delman Cheadle for concern of mac off RD LEE- 02.04.19  Ocular Hx-  PMH- A-Fib on coumadin, severe anxiety, CAD, depression    CURRENT MEDICATIONS: Current Outpatient Medications (Ophthalmic Drugs)  Medication Sig  . neomycin-polymyxin b-dexamethasone (MAXITROL) 3.5-10000-0.1 OINT Place 1 application into the right eye 4 (four) times daily.   No current facility-administered medications for this visit.  (Ophthalmic Drugs)   Current Outpatient Medications  (Other)  Medication Sig  . HYDROcodone-Acetaminophen (LORTAB) 10-300 MG/15ML SOLN Take by mouth.  . ALPRAZolam (XANAX) 1 MG tablet Take 1 tablet (1 mg total) by mouth 3 (three) times daily as needed for anxiety.  Marland Kitchen b complex vitamins capsule Take 2 capsules by mouth daily.   . Biotin 5 MG CAPS Take by mouth.  . Cholecalciferol (VITAMIN D) 2000 UNITS CAPS Take 2,000 Int'l Units by mouth.  . Coenzyme Q10-Fish Oil-Vit E (CO-Q 10 OMEGA-3 FISH OIL PO) Take 100 mg elemental calcium/kg/hr by mouth daily.  . cyclobenzaprine (FLEXERIL) 5 MG tablet Take 1 tablet (5 mg total) by mouth 2 (two) times daily as needed for muscle spasms.  . Diclofenac Sodium (PENNSAID) 2 % SOLN Place 1 application onto the skin 2 (two) times daily as needed.  . diltiazem (CARDIZEM CD) 180 MG 24 hr capsule Take 1 capsule (180 mg total) by mouth 2 (two) times daily.  . DULoxetine (CYMBALTA) 60 MG capsule Take 1 capsule (60 mg total) by mouth daily.  . fluticasone (FLONASE) 50 MCG/ACT nasal spray instill 2 sprays into each nostril once daily (Patient taking differently: instill 2 sprays into each nostril once daily as needed)  . furosemide (LASIX) 20 MG tablet Take 1 tablet (20 mg total) by mouth daily. Please keep upcoming appointment for further refills  . LIVALO 2 MG TABS TAKE 1 TABLET EVERY DAY  . loratadine (CLARITIN) 10 MG tablet Take 10  mg by mouth daily.   Marland Kitchen losartan (COZAAR) 25 MG tablet TAKE 1 TABLET BY MOUTH DAILY  . Lysine 500 MG TABS Take 500 mg by mouth daily.  . metoprolol succinate (TOPROL-XL) 25 MG 24 hr tablet Take 1 tablet (25 mg total) by mouth daily.  . nitroGLYCERIN (NITROSTAT) 0.4 MG SL tablet place 1 tablet under the tongue every 5 minutes for UP TO 3 doses if needed  . valACYclovir (VALTREX) 1000 MG tablet Take 2 tablets at onset of cold sores and repeat 2 in 12 hours.  Marland Kitchen warfarin (COUMADIN) 5 MG tablet TAKE 1 TABLET EVERY DAY EXCEPT 1/2 TABLET ON SUNDAY AND TUESDAYS OR AS DIRECTED   No current  facility-administered medications for this visit.  (Other)      REVIEW OF SYSTEMS: ROS    Positive for: Cardiovascular, Eyes, Psychiatric, Heme/Lymph   Negative for: Constitutional, Gastrointestinal, Neurological, Skin, Genitourinary, Musculoskeletal, HENT, Endocrine, Respiratory, Allergic/Imm   Last edited by Debbrah Alar, COT on 06/04/2017  1:19 PM. (History)       ALLERGIES Allergies  Allergen Reactions  . Chlorhexidine Base Itching    CHG WIPES  . Penicillins Hives and Rash    PAST MEDICAL HISTORY Past Medical History:  Diagnosis Date  . ABDOMINAL PAIN RIGHT UPPER QUADRANT 11/08/2008  . ALLERGIC RHINITIS 08/01/2009  . Anticoagulated on Coumadin   . Anxiety   . CAD, NATIVE VESSEL 05/30/2009   moderate risk coronary calcium score by chest CT  . DDD (degenerative disc disease), lumbar   . DEPRESSION 06/13/2008  . DYSLIPIDEMIA 02/27/2009  . DYSPNEA 02/27/2009  . Frequency of urination   . GERD (gastroesophageal reflux disease)   . History of Bell's palsy   . History of DVT of lower extremity 2006-- CHRONIC COUMADIN THERAPY  . History of hiatal hernia   . History of Lyme disease   . 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 Mitral stenosis 11/13/2013   mild by echo  . NON-HODGKIN'S LYMPHOMA, HX OF dx  2005--  chemoradiation completed 2006--  no recurrence   onocologist- dr odogwu--   . OSTEOARTHRITIS, MODERATE 04/07/2010  . Permanent atrial fibrillation ([place])   . Retroperitoneal fibrosis    Past Surgical History:  Procedure Laterality Date  . BREAST MASS EXCISION     BENIGN-- RIGHT BREAST  . CARDIOVASCULAR STRESS TEST  04-04-2009-- PER PT ASYMPTOMATIC-- MEDICAL MANAGEMENT   STUDY WAS EQUIVOCAL/ EF 49%/ GLOBAL HYPOKINESIS/ APPEARS TO BE A MILD ANTERIOR PERFUSION DEFECT COULD REPRESENT ISCHEMIA BUT DUE TO  ACTIVITY WORSE IN STRESS THAN AT REST POSS. THE DEFECT WAS ARTIFACTUAL  . CARDIOVERSION N/A 07/11/2015   Procedure: CARDIOVERSION;   Surgeon: Larey Dresser, MD;  Location: [facility] [facility];  Service: Cardiovascular;  Laterality: N/A;  . CT ANGIOGRAM  FEB 2011   CALCIUM SCORE 161/ POSS. MODERATE MID LAD STENOSIS  . CYSTOSCOPY W/ RETROGRADES  04/28/2011   Procedure: CYSTOSCOPY WITH RETROGRADE PYELOGRAM;  Surgeon: Fredricka Bonine, MD;  Location: [facility];  Service: Urology;  Laterality: Left;  . CYSTOSCOPY W/ URETERAL STENT REMOVAL  04/28/2011   Procedure: CYSTOSCOPY WITH STENT REMOVAL;  Surgeon: Fredricka Bonine, MD;  Location: [facility];  Service: Urology;;  . EXPLORATORY LAPAROTOMY  2005   LYMPHOMA  . KNEE ARTHROSCOPY  2005   RIGHT  . MULTIPLE CYSTO/ LEFT URETERAL STENT EXCHANGES  LAST ONE 10-28-10  . RETROPERITONEAL BX  2007  . REVERSE SHOULDER ARTHROPLASTY Right 01/31/2015   Procedure: RIGHT  REVERSE SHOULDER ARTHROPLASTY;  Surgeon: Justice Britain, MD;  Location: [facility];  Service: Orthopedics;  Laterality: Right;  . TONSILLECTOMY  CHILD  . TOTAL KNEE ARTHROPLASTY  OCT 2010   RIGHT  . TOTAL KNEE ARTHROPLASTY  JAN 2010   LEFT  . TRANSTHORACIC ECHOCARDIOGRAM  [id number]   MILD SYSTOLIC DYSFUNCTION/ EF 47-65%/ MODERATE DIASTOLIC DYSFUCTION/ MILD MR/ MILD BIATRIAL ENLARGEMENT    FAMILY HISTORY Family History  Problem Relation Age of Onset  . Arthritis Mother   . Heart disease Mother   . Heart attack Mother   . Diabetes Sister   . Hypertension Sister   . Stroke Sister   . Heart attack Sister     SOCIAL HISTORY Social History   Tobacco Use  . Smoking status: Former Smoker    Packs/day: 2.00    Years: 52.00    Pack years: 104.00    Types: Cigarettes    Last attempt to quit: 03/24/2008    Years since quitting: 9.2  . Smokeless tobacco: Never Used  Substance Use Topics  . Alcohol use: Yes    Comment: RARE  . Drug use: No         OPHTHALMIC EXAM:  Base Eye Exam    Visual Acuity (Snellen - Linear)      Right Left   Dist cc 20/100 -2 20/60 -1   Dist ph cc  20/50 -1 20/40 -2   Correction:  Glasses       Tonometry (Tonopen, 1:29 PM)      Right Left   Pressure 14 13       Pupils      Dark Light Shape React APD   Right 5 3 Round Brisk None   Left 5 3 Round Brisk None       Visual Fields (Counting fingers)      Left Right    Full Full       Extraocular Movement      Right Left    Full, Ortho Full, Ortho       Neuro/Psych    Oriented x3:  Yes   Mood/Affect:  Normal       Dilation    Both eyes:  1.0% Mydriacyl, 2.5% Phenylephrine @ 1:29 PM        Slit Lamp and Fundus Exam    External Exam      Right Left   External Periorbital edema        Slit Lamp Exam      Right Left   Lids/Lashes Dermatochalasis - upper lid Dermatochalasis - upper lid   Conjunctiva/Sclera 1+ injection; [place] resolved White and quiet   Cornea Arcus, 1+ diffuse Punctate epithelial erosions Arcus, 2+ diffuse Punctate epithelial erosions   Anterior Chamber Deep and quiet Deep and quiet   Iris Round and dilated Round and dilated   Lens 3+ Nuclear sclerosis, 2+ Cortical cataract, mild brunescence  3+ Nuclear sclerosis, 2+ Cortical cataract   Vitreous Vitreous syneresis, gas bubble gone Posterior vitreous detachment       Fundus Exam      Right Left   Disc Temporal Peripapillary atrophy, otherwise normal    C/D Ratio 0.35 0.3   Macula Trace SRF inferior, macula reattaching     Vessels Vascular attenuation    Periphery bullous ST detachment from 0730 to 1200--improved, small horseshoe tear at 1030, large horseshoe tear at 1100 - good cryo changes surrounding, SRF is improved, pigmented demarcation line along 0730 border  IMAGING AND PROCEDURES  Imaging and Procedures for 06/05/17  OCT, Retina - OU - Both Eyes     Right Eye Quality was good. Central Foveal Thickness: 263. Progression has been stable. Findings include subretinal fluid, epiretinal membrane, no IRF, normal foveal contour.   Left Eye Quality was good. Central Foveal  Thickness: 277. Progression has been stable. Findings include normal foveal contour, no IRF, no SRF, retinal drusen , outer retinal atrophy, subretinal hyper-reflective material, epiretinal membrane.   Notes *Images captured and stored on drive  Diagnosis / Impression:  OD: retina re-attaching, trace residual SRF greatest inferior / temporal OS: Non-Exudative AMD  Clinical management:  See below  Abbreviations: NFP - Normal foveal profile. CME - cystoid macular edema. PED - pigment epithelial detachment. IRF - intraretinal fluid. SRF - subretinal fluid. EZ - ellipsoid zone. ERM - epiretinal membrane. ORA - outer retinal atrophy. ORT - outer retinal tubulation. SRHM - subretinal hyper-reflective material                  ASSESSMENT/PLAN:    ICD-10-CM   1. Right retinal detachment H33.21   2. Retinal edema H35.81 OCT, Retina - OU - Both Eyes  3. Intermediate stage nonexudative age-related macular degeneration of both eyes H35.3132   4. Combined form of age-related cataract, both eyes H25.813     1. Retinal detachment, OD-  - macula- and fovea-involving rhegmatogenous retinal detachment - initial OCT showed IRF/cystic changes suggestive of chronicity of detachment - supero-temporal detachment from 0730 to 1200 with small horseshoe tear located at 1030 and large horseshoe tear located at 1100 - s/p pneumatic cryopexy OD (02.04.19) - good cryo changes present and SRF improving -- sliver of SRF persistent under macula - gas bubble gone now - BCVA continues to improve -- now 20/50 OD - pt reported moderate compliance with positioning when bubble was in place - F/U 3 weeks  2. Retinal edema  - as discussed above, detached retina had cystic changes / IRF suggestive of chronic detachment -- now improved post pneumatic cryopexy  3. Age related macular degeneration, non-exudative, both eyes  - The incidence, anatomy, and pathology of dry AMD, risk of progression, and the AREDS and  AREDS 2 study including smoking risks discussed with patient.  - Recommend amsler grid monitoring  4. Combined form age-related cataract OU-  - The symptoms of cataract, surgical options, and treatments and risks were discussed with patient. - discussed diagnosis and progression - visually significant - recommend cataract evaluation once retina stable / healed   Ophthalmic Meds Ordered this visit:  No orders of the defined types were placed in this encounter.      Return in about 3 weeks (around 06/25/2017) for F/U RD repair OD.  There are no Patient Instructions on file for this visit.   Explained the diagnoses, plan, and follow up with the patient and they expressed understanding.  Patient expressed understanding of the importance of proper follow up care.   This document serves as a record of services personally performed by Gardiner Sleeper, MD, PhD. It was created on their behalf by Catha Brow, [place], a certified ophthalmic assistant. The creation of this record is the provider's dictation and/or activities during the visit.  Electronically signed by: Catha Brow, [place]  06/05/17 10:42 PM   Gardiner Sleeper, M.D., Ph.D. Diseases & Surgery of the Retina and [place] 06/05/17  I have reviewed the above documentation for accuracy and completeness, and  I agree with the above. Gardiner Sleeper, M.D., Ph.D. 06/05/17 10:42 PM   Abbreviations: M myopia (nearsighted); A astigmatism; H hyperopia (farsighted); P presbyopia; Mrx spectacle prescription;  CTL contact lenses; OD right eye; OS left eye; OU both eyes  XT exotropia; ET esotropia; PEK punctate epithelial keratitis; PEE punctate epithelial erosions; DES dry eye syndrome; MGD meibomian gland dysfunction; ATs artificial tears; PFAT's preservative free artificial tears; [place] nuclear sclerotic cataract; PSC posterior subcapsular cataract; ERM epi-retinal membrane; PVD posterior vitreous detachment;  RD retinal detachment; DM diabetes mellitus; DR diabetic retinopathy; NPDR non-proliferative diabetic retinopathy; PDR proliferative diabetic retinopathy; CSME clinically significant macular edema; DME diabetic macular edema; dbh dot blot hemorrhages; CWS cotton wool spot; POAG primary open angle glaucoma; C/D cup-to-disc ratio; HVF humphrey visual field; GVF goldmann visual field; OCT optical coherence tomography; IOP intraocular pressure; BRVO Branch retinal vein occlusion; CRVO central retinal vein occlusion; CRAO central retinal artery occlusion; BRAO branch retinal artery occlusion; RT retinal tear; SB scleral buckle; PPV pars plana vitrectomy; VH Vitreous hemorrhage; PRP panretinal laser photocoagulation; IVK intravitreal kenalog; VMT vitreomacular traction; MH Macular hole;  NVD neovascularization of the disc; NVE neovascularization elsewhere; AREDS age related eye disease study; ARMD age related macular degeneration; POAG primary open angle glaucoma; EBMD epithelial/anterior basement membrane dystrophy; ACIOL anterior chamber intraocular lens; IOL intraocular lens; PCIOL posterior chamber intraocular lens; Phaco/IOL phacoemulsification with intraocular lens placement; [place] photorefractive keratectomy; LASIK laser assisted in situ keratomileusis; HTN hypertension; DM diabetes mellitus; COPD chronic obstructive pulmonary disease

## 2017-06-04 ENCOUNTER — Encounter (INDEPENDENT_AMBULATORY_CARE_PROVIDER_SITE_OTHER): Payer: Self-pay | Admitting: Ophthalmology

## 2017-06-04 ENCOUNTER — Ambulatory Visit (INDEPENDENT_AMBULATORY_CARE_PROVIDER_SITE_OTHER): Payer: Medicare Other | Admitting: Ophthalmology

## 2017-06-04 DIAGNOSIS — H353132 Nonexudative age-related macular degeneration, bilateral, intermediate dry stage: Secondary | ICD-10-CM

## 2017-06-04 DIAGNOSIS — H25813 Combined forms of age-related cataract, bilateral: Secondary | ICD-10-CM

## 2017-06-04 DIAGNOSIS — H3581 Retinal edema: Secondary | ICD-10-CM | POA: Diagnosis not present

## 2017-06-04 DIAGNOSIS — H3321 Serous retinal detachment, right eye: Secondary | ICD-10-CM

## 2017-06-05 ENCOUNTER — Encounter (INDEPENDENT_AMBULATORY_CARE_PROVIDER_SITE_OTHER): Payer: Self-pay | Admitting: Ophthalmology

## 2017-06-11 ENCOUNTER — Ambulatory Visit (INDEPENDENT_AMBULATORY_CARE_PROVIDER_SITE_OTHER): Payer: Medicare Other | Admitting: Family Medicine

## 2017-06-11 ENCOUNTER — Encounter: Payer: Self-pay | Admitting: Family Medicine

## 2017-06-11 VITALS — BP 110/70 | HR 70 | Temp 97.7°F | Wt 227.0 lb

## 2017-06-11 DIAGNOSIS — R05 Cough: Secondary | ICD-10-CM

## 2017-06-11 DIAGNOSIS — R059 Cough, unspecified: Secondary | ICD-10-CM

## 2017-06-11 MED ORDER — LEVOFLOXACIN 500 MG PO TABS: 500.0000 mg | ORAL_TABLET | Freq: Every day | ORAL | 0 refills | Status: DC

## 2017-06-11 NOTE — Patient Instructions (Signed)
Drink plenty of fluids Follow up for any fever or increased shortness of breath.

## 2017-06-11 NOTE — Progress Notes (Signed)
Subjective:     Patient ID: [name], female   DOB: [birth year], 72 y.o.   MRN: [id number]  HPI Patient seen with chief complaint of cough and body aches. It 2 days ago she woke up with some severe bodyaches in the last about 2 days. She had subjective fever but temperature not taken. She had some occasional chills. Those symptoms seem to be somewhat better today. No sore throat. She has had occasional cough but infrequent. No sinus congestion. Denies any nausea, vomiting, or diarrhea. No dysuria. No skin rash.  Past Medical History:  Diagnosis Date  . ABDOMINAL PAIN RIGHT UPPER QUADRANT 11/08/2008  . ALLERGIC RHINITIS 08/01/2009  . Anticoagulated on Coumadin   . Anxiety   . CAD, NATIVE VESSEL 05/30/2009   moderate risk coronary calcium score by chest CT  . DDD (degenerative disc disease), lumbar   . DEPRESSION 06/13/2008  . DYSLIPIDEMIA 02/27/2009  . DYSPNEA 02/27/2009  . Frequency of urination   . GERD (gastroesophageal reflux disease)   . History of Bell's palsy   . History of DVT of lower extremity 2006-- CHRONIC COUMADIN THERAPY  . History of hiatal hernia   . History of Lyme disease   . 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 Mitral stenosis 11/13/2013   mild by echo  . NON-HODGKIN'S LYMPHOMA, HX OF dx  2005--  chemoradiation completed 2006--  no recurrence   onocologist- dr odogwu--   . OSTEOARTHRITIS, MODERATE 04/07/2010  . Permanent atrial fibrillation ([place])   . Retroperitoneal fibrosis    Past Surgical History:  Procedure Laterality Date  . BREAST MASS EXCISION     BENIGN-- RIGHT BREAST  . CARDIOVASCULAR STRESS TEST  04-04-2009-- PER PT ASYMPTOMATIC-- MEDICAL MANAGEMENT   STUDY WAS EQUIVOCAL/ EF 49%/ GLOBAL HYPOKINESIS/ APPEARS TO BE A MILD ANTERIOR PERFUSION DEFECT COULD REPRESENT ISCHEMIA BUT DUE TO  ACTIVITY WORSE IN STRESS THAN AT REST POSS. THE DEFECT WAS ARTIFACTUAL  . CARDIOVERSION N/A 07/11/2015   Procedure: CARDIOVERSION;   Surgeon: Larey Dresser, MD;  Location: [place];  Service: Cardiovascular;  Laterality: N/A;  . CT ANGIOGRAM  FEB 2011   CALCIUM SCORE 161/ POSS. MODERATE MID LAD STENOSIS  . CYSTOSCOPY W/ RETROGRADES  04/28/2011   Procedure: CYSTOSCOPY WITH RETROGRADE PYELOGRAM;  Surgeon: Fredricka Bonine, MD;  Location: [facility];  Service: Urology;  Laterality: Left;  . CYSTOSCOPY W/ URETERAL STENT REMOVAL  04/28/2011   Procedure: CYSTOSCOPY WITH STENT REMOVAL;  Surgeon: Fredricka Bonine, MD;  Location: [facility];  Service: Urology;;  . EXPLORATORY LAPAROTOMY  2005   LYMPHOMA  . KNEE ARTHROSCOPY  2005   RIGHT  . MULTIPLE CYSTO/ LEFT URETERAL STENT EXCHANGES  LAST ONE 10-28-10  . RETROPERITONEAL BX  2007  . REVERSE SHOULDER ARTHROPLASTY Right 01/31/2015   Procedure: RIGHT REVERSE SHOULDER ARTHROPLASTY;  Surgeon: Justice Britain, MD;  Location: [facility];  Service: Orthopedics;  Laterality: Right;  . TONSILLECTOMY  CHILD  . TOTAL KNEE ARTHROPLASTY  OCT 2010   RIGHT  . TOTAL KNEE ARTHROPLASTY  JAN 2010   LEFT  . TRANSTHORACIC ECHOCARDIOGRAM  [id number]   MILD SYSTOLIC DYSFUNCTION/ EF 71-24%/ MODERATE DIASTOLIC DYSFUCTION/ MILD MR/ MILD BIATRIAL ENLARGEMENT    reports that she quit smoking about 9 years ago. Her smoking use included cigarettes. She has a 104.00 pack-year smoking history. She has never used smokeless tobacco. She reports that she drinks alcohol. She reports that she does not use drugs. family history includes  Arthritis in her mother; Diabetes in her sister; Heart attack in her mother and sister; Heart disease in her mother; Hypertension in her sister; Stroke in her sister. Allergies  Allergen Reactions  . Chlorhexidine Base Itching    CHG WIPES  . Penicillins Hives and Rash     Review of Systems  Constitutional: Positive for chills, fatigue and fever.  HENT: Negative for sinus pressure, sinus pain and sore throat.   Respiratory: Positive  for cough.   Cardiovascular: Negative for chest pain.  Genitourinary: Negative for dysuria.       Objective:   Physical Exam  Constitutional: She appears well-developed and well-nourished.  HENT:  Right Ear: External ear normal.  Left Ear: External ear normal.  Mouth/Throat: Oropharynx is clear and moist.  Neck: Neck supple.  Cardiovascular: Normal rate.  Pulmonary/Chest: Effort normal and breath sounds normal. No respiratory distress. She has no wheezes. She has no rales.  Lymphadenopathy:    She has no cervical adenopathy.       Assessment:     Cough. Suspect viral URI. Nonfocal exam and no respiratory distress. Doubt influenza. She had subjective fever for 2 days but now resolved.    Plan:     -We recommended plenty of rest and fluids and observation for now. -Patient was concerned because of her multiple comorbidities. We did explain if she had any recurrent fever or worsening symptoms would agree to starting Levaquin 500 milligrams once daily with printed prescription given for 7 days  [name] MD [facility]

## 2017-06-21 NOTE — Progress Notes (Deleted)
[facility] Note  06/23/2017     CHIEF COMPLAINT Patient presents for No chief complaint on file.   HISTORY OF PRESENT ILLNESS: [name] is a 72 y.o. female who presents to the clinic today for:   Pt states she has not been able to follow up due to caring for her son; Pt states gas bubble disappeared x 2-3 days ago; Pt endorses she found herself on her back approx 2 days, pt states she had not been keeping head position as directed;   Referring physician: [name], MD [place], [place] [address]  HISTORICAL INFORMATION:   Selected notes from the MEDICAL RECORD NUMBER Referred by Dr. Delman Cheadle for concern of mac off RD LEE- 02.04.19  Ocular Hx-  PMH- A-Fib on coumadin, severe anxiety, CAD, depression    CURRENT MEDICATIONS: No current outpatient medications on file. (Ophthalmic Drugs)   No current facility-administered medications for this visit.  (Ophthalmic Drugs)   Current Outpatient Medications (Other)  Medication Sig  . ALPRAZolam (XANAX) 1 MG tablet Take 1 tablet (1 mg total) by mouth 3 (three) times daily as needed for anxiety.  Marland Kitchen b complex vitamins capsule Take 2 capsules by mouth daily.   . Biotin 5 MG CAPS Take by mouth.  . Cholecalciferol (VITAMIN D) 2000 UNITS CAPS Take 2,000 Int'l Units by mouth.  . Coenzyme Q10-Fish Oil-Vit E (CO-Q 10 OMEGA-3 FISH OIL PO) Take 100 mg elemental calcium/kg/hr by mouth daily.  . cyclobenzaprine (FLEXERIL) 5 MG tablet Take 1 tablet (5 mg total) by mouth 2 (two) times daily as needed for muscle spasms.  Marland Kitchen diltiazem (CARDIZEM CD) 180 MG 24 hr capsule Take 1 capsule (180 mg total) by mouth 2 (two) times daily.  . DULoxetine (CYMBALTA) 60 MG capsule Take 1 capsule (60 mg total) by mouth daily.  . fluticasone (FLONASE) 50 MCG/ACT nasal spray instill 2 sprays into each nostril once daily (Patient taking differently: instill 2 sprays into each nostril once daily as needed)  .  furosemide (LASIX) 20 MG tablet Take 1 tablet (20 mg total) by mouth daily. Please keep upcoming appointment for further refills  . HYDROcodone-Acetaminophen (LORTAB) 10-300 MG/15ML SOLN Take by mouth.  . levofloxacin (LEVAQUIN) 500 MG tablet Take 1 tablet (500 mg total) by mouth daily.  Marland Kitchen LIVALO 2 MG TABS TAKE 1 TABLET EVERY DAY  . loratadine (CLARITIN) 10 MG tablet Take 10 mg by mouth daily.   Marland Kitchen losartan (COZAAR) 25 MG tablet TAKE 1 TABLET BY MOUTH DAILY  . Lysine 500 MG TABS Take 500 mg by mouth daily.  . metoprolol succinate (TOPROL-XL) 25 MG 24 hr tablet Take 1 tablet (25 mg total) by mouth daily.  . nitroGLYCERIN (NITROSTAT) 0.4 MG SL tablet place 1 tablet under the tongue every 5 minutes for UP TO 3 doses if needed  . valACYclovir (VALTREX) 1000 MG tablet Take 2 tablets at onset of cold sores and repeat 2 in 12 hours.  Marland Kitchen warfarin (COUMADIN) 5 MG tablet TAKE 1 TABLET EVERY DAY EXCEPT 1/2 TABLET ON SUNDAY AND TUESDAYS OR AS DIRECTED   No current facility-administered medications for this visit.  (Other)      REVIEW OF SYSTEMS:    ALLERGIES Allergies  Allergen Reactions  . Chlorhexidine Base Itching    CHG WIPES  . Penicillins Hives and Rash    PAST MEDICAL HISTORY Past Medical History:  Diagnosis Date  . ABDOMINAL PAIN RIGHT UPPER QUADRANT 11/08/2008  .  ALLERGIC RHINITIS 08/01/2009  . Anticoagulated on Coumadin   . Anxiety   . CAD, NATIVE VESSEL 05/30/2009   moderate risk coronary calcium score by chest CT  . DDD (degenerative disc disease), lumbar   . DEPRESSION 06/13/2008  . DYSLIPIDEMIA 02/27/2009  . DYSPNEA 02/27/2009  . Frequency of urination   . GERD (gastroesophageal reflux disease)   . History of Bell's palsy   . History of DVT of lower extremity 2006-- CHRONIC COUMADIN THERAPY  . History of hiatal hernia   . History of Lyme disease   . 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 Mitral stenosis 11/13/2013   mild by echo  .  NON-HODGKIN'S LYMPHOMA, HX OF dx  2005--  chemoradiation completed 2006--  no recurrence   onocologist- dr odogwu--   . OSTEOARTHRITIS, MODERATE 04/07/2010  . Permanent atrial fibrillation ([place])   . Retroperitoneal fibrosis    Past Surgical History:  Procedure Laterality Date  . BREAST MASS EXCISION     BENIGN-- RIGHT BREAST  . CARDIOVASCULAR STRESS TEST  04-04-2009-- PER PT ASYMPTOMATIC-- MEDICAL MANAGEMENT   STUDY WAS EQUIVOCAL/ EF 49%/ GLOBAL HYPOKINESIS/ APPEARS TO BE A MILD ANTERIOR PERFUSION DEFECT COULD REPRESENT ISCHEMIA BUT DUE TO  ACTIVITY WORSE IN STRESS THAN AT REST POSS. THE DEFECT WAS ARTIFACTUAL  . CARDIOVERSION N/A 07/11/2015   Procedure: CARDIOVERSION;  Surgeon: Larey Dresser, MD;  Location: [facility];  Service: Cardiovascular;  Laterality: N/A;  . CT ANGIOGRAM  FEB 2011   CALCIUM SCORE 161/ POSS. MODERATE MID LAD STENOSIS  . CYSTOSCOPY W/ RETROGRADES  04/28/2011   Procedure: CYSTOSCOPY WITH RETROGRADE PYELOGRAM;  Surgeon: Fredricka Bonine, MD;  Location[facility];  Service: Urology;  Laterality: Left;  . CYSTOSCOPY W/ URETERAL STENT REMOVAL  04/28/2011   Procedure: CYSTOSCOPY WITH STENT REMOVAL;  Surgeon: Fredricka Bonine, MD;  Location: [facility];  Service: Urology;;  . EXPLORATORY LAPAROTOMY  2005   LYMPHOMA  . KNEE ARTHROSCOPY  2005   RIGHT  . MULTIPLE CYSTO/ LEFT URETERAL STENT EXCHANGES  LAST ONE 10-28-10  . RETROPERITONEAL BX  2007  . REVERSE SHOULDER ARTHROPLASTY Right 01/31/2015   Procedure: RIGHT REVERSE SHOULDER ARTHROPLASTY;  Surgeon: Justice Britain, MD;  Location: [facility];  Service: Orthopedics;  Laterality: Right;  . TONSILLECTOMY  CHILD  . TOTAL KNEE ARTHROPLASTY  OCT 2010   RIGHT  . TOTAL KNEE ARTHROPLASTY  JAN 2010   LEFT  . TRANSTHORACIC ECHOCARDIOGRAM  [id number]   MILD SYSTOLIC DYSFUNCTION/ EF 54-27%/ MODERATE DIASTOLIC DYSFUCTION/ MILD MR/ MILD BIATRIAL ENLARGEMENT    FAMILY HISTORY Family History   Problem Relation Age of Onset  . Arthritis Mother   . Heart disease Mother   . Heart attack Mother   . Diabetes Sister   . Hypertension Sister   . Stroke Sister   . Heart attack Sister     SOCIAL HISTORY Social History   Tobacco Use  . Smoking status: Former Smoker    Packs/day: 2.00    Years: 52.00    Pack years: 104.00    Types: Cigarettes    Last attempt to quit: 03/24/2008    Years since quitting: 9.2  . Smokeless tobacco: Never Used  Substance Use Topics  . Alcohol use: Yes    Comment: RARE  . Drug use: No         OPHTHALMIC EXAM:   Not recorded      IMAGING AND PROCEDURES  Imaging and Procedures for 06/21/17  ASSESSMENT/PLAN:    ICD-10-CM   1. Right retinal detachment H33.21   2. Retinal edema H35.81 OCT, Retina - OU - Both Eyes  3. Intermediate stage nonexudative age-related macular degeneration of both eyes H35.3132   4. Combined form of age-related cataract, both eyes H25.813     1. Retinal detachment, OD-  - macula- and fovea-involving rhegmatogenous retinal detachment - initial OCT showed IRF/cystic changes suggestive of chronicity of detachment - supero-temporal detachment from 0730 to 1200 with small horseshoe tear located at 1030 and large horseshoe tear located at 1100 - s/p pneumatic cryopexy OD (02.04.19) - good cryo changes present and SRF improving -- sliver of SRF persistent under macula - gas bubble gone now - BCVA continues to improve -- now 20/50 OD - pt reported moderate compliance with positioning when bubble was in place - F/U 3 weeks  2. Retinal edema  - as discussed above, detached retina had cystic changes / IRF suggestive of chronic detachment -- now improved post pneumatic cryopexy  3. Age related macular degeneration, non-exudative, both eyes  - The incidence, anatomy, and pathology of dry AMD, risk of progression, and the AREDS and AREDS 2 study including smoking risks discussed with patient.  - Recommend  amsler grid monitoring  4. Combined form age-related cataract OU-  - The symptoms of cataract, surgical options, and treatments and risks were discussed with patient. - discussed diagnosis and progression - visually significant - recommend cataract evaluation once retina stable / healed   Ophthalmic Meds Ordered this visit:  No orders of the defined types were placed in this encounter.      No follow-ups on file.  There are no Patient Instructions on file for this visit.   Explained the diagnoses, plan, and follow up with the patient and they expressed understanding.  Patient expressed understanding of the importance of proper follow up care.   This document serves as a record of services personally performed by Gardiner Sleeper, MD, PhD. It was created on their behalf by Catha Brow, [place], a certified ophthalmic assistant. The creation of this record is the provider's dictation and/or activities during the visit.  Electronically signed by: Catha Brow, COA  06/21/17 8:25 AM   Gardiner Sleeper, M.D., Ph.D. Diseases & Surgery of the Retina and Vitreous [facility] 06/21/17    Abbreviations: M myopia (nearsighted); A astigmatism; H hyperopia (farsighted); P presbyopia; Mrx spectacle prescription;  CTL contact lenses; OD right eye; OS left eye; OU both eyes  XT exotropia; ET esotropia; PEK punctate epithelial keratitis; PEE punctate epithelial erosions; DES dry eye syndrome; MGD meibomian gland dysfunction; ATs artificial tears; PFAT's preservative free artificial tears; [place] nuclear sclerotic cataract; PSC posterior subcapsular cataract; ERM epi-retinal membrane; PVD posterior vitreous detachment; RD retinal detachment; DM diabetes mellitus; DR diabetic retinopathy; NPDR non-proliferative diabetic retinopathy; PDR proliferative diabetic retinopathy; CSME clinically significant macular edema; DME diabetic macular edema; dbh dot blot hemorrhages; CWS cotton  wool spot; POAG primary open angle glaucoma; C/D cup-to-disc ratio; HVF humphrey visual field; GVF goldmann visual field; OCT optical coherence tomography; IOP intraocular pressure; BRVO Branch retinal vein occlusion; CRVO central retinal vein occlusion; CRAO central retinal artery occlusion; BRAO branch retinal artery occlusion; RT retinal tear; SB scleral buckle; PPV pars plana vitrectomy; VH Vitreous hemorrhage; PRP panretinal laser photocoagulation; IVK intravitreal kenalog; VMT vitreomacular traction; MH Macular hole;  NVD neovascularization of the disc; NVE neovascularization elsewhere; AREDS age related eye disease study; ARMD age related macular degeneration; POAG  primary open angle glaucoma; EBMD epithelial/anterior basement membrane dystrophy; ACIOL anterior chamber intraocular lens; IOL intraocular lens; PCIOL posterior chamber intraocular lens; Phaco/IOL phacoemulsification with intraocular lens placement; [place] photorefractive keratectomy; LASIK laser assisted in situ keratomileusis; HTN hypertension; DM diabetes mellitus; COPD chronic obstructive pulmonary disease

## 2017-06-23 ENCOUNTER — Encounter (INDEPENDENT_AMBULATORY_CARE_PROVIDER_SITE_OTHER): Payer: Medicare Other | Admitting: Ophthalmology

## 2017-07-02 ENCOUNTER — Other Ambulatory Visit: Payer: Self-pay | Admitting: *Deleted

## 2017-07-02 MED ORDER — BUPROPION HCL ER (XL) 300 MG PO TB24: 300.0000 mg | ORAL_TABLET | Freq: Every day | ORAL | 0 refills | Status: DC

## 2017-07-09 ENCOUNTER — Telehealth: Payer: Self-pay | Admitting: Cardiology

## 2017-07-09 NOTE — Telephone Encounter (Signed)
Pt overdue for follow-up, cancelled appt on 05/13/17 and did not reschedule.  Needs OV for refill.

## 2017-07-12 NOTE — Progress Notes (Signed)
[facility] Note  07/13/2017     CHIEF COMPLAINT Patient presents for Retina Follow Up   HISTORY OF PRESENT ILLNESS: [name] is a 72 y.o. female who presents to the clinic today for:   HPI    Retina Follow Up    Patient presents with  Retinal Break/Detachment.  In right eye.  This started 2 months ago.  Severity is mild.  Since onset it is stable.  I, the attending physician,  performed the HPI with the patient and updated documentation appropriately.          Comments    F/U RD repair OD(04/26/17) Patient states last week "she felt as if something was in the way of her vision, she would blink a few times then it would go away", it lasted for appx 2-3 days. Pt reports her vision is about the same since last ov, no new on set since last week. Pt is taking fish oil Qd.        Last edited by Bernarda Caffey, MD on 07/13/2017  3:34 PM. (History)    Pt states she had blurriness for 2-3 days that improved with blinking, states OU VA stable now,   Referring physician: [name], MD [place], [place] [address]  HISTORICAL INFORMATION:   Selected notes from the MEDICAL RECORD NUMBER Referred by Dr. Delman Cheadle for concern of mac off RD LEE- 02.04.19  Ocular Hx-  PMH- A-Fib on coumadin, severe anxiety, CAD, depression    CURRENT MEDICATIONS: No current outpatient medications on file. (Ophthalmic Drugs)   No current facility-administered medications for this visit.  (Ophthalmic Drugs)   Current Outpatient Medications (Other)  Medication Sig  . ALPRAZolam (XANAX) 1 MG tablet Take 1 tablet (1 mg total) by mouth 3 (three) times daily as needed for anxiety.  Marland Kitchen b complex vitamins capsule Take 2 capsules by mouth daily.   . Biotin 5 MG CAPS Take by mouth.  Marland Kitchen buPROPion (WELLBUTRIN XL) 300 MG 24 hr tablet Take 1 tablet (300 mg total) by mouth daily.  . Cholecalciferol (VITAMIN D) 2000 UNITS CAPS Take 2,000 Int'l Units by mouth.  .  Coenzyme Q10-Fish Oil-Vit E (CO-Q 10 OMEGA-3 FISH OIL PO) Take 100 mg elemental calcium/kg/hr by mouth daily.  . cyclobenzaprine (FLEXERIL) 5 MG tablet Take 1 tablet (5 mg total) by mouth 2 (two) times daily as needed for muscle spasms.  Marland Kitchen diltiazem (CARDIZEM CD) 180 MG 24 hr capsule Take 1 capsule (180 mg total) by mouth 2 (two) times daily.  . DULoxetine (CYMBALTA) 60 MG capsule Take 1 capsule (60 mg total) by mouth daily.  . fluticasone (FLONASE) 50 MCG/ACT nasal spray instill 2 sprays into each nostril once daily (Patient taking differently: instill 2 sprays into each nostril once daily as needed)  . furosemide (LASIX) 20 MG tablet Take 1 tablet (20 mg total) by mouth daily. Please keep upcoming appointment for further refills  . HYDROcodone-Acetaminophen (LORTAB) 10-300 MG/15ML SOLN Take by mouth.  Marland Kitchen LIVALO 2 MG TABS TAKE 1 TABLET EVERY DAY  . loratadine (CLARITIN) 10 MG tablet Take 10 mg by mouth daily.   Marland Kitchen losartan (COZAAR) 25 MG tablet TAKE 1 TABLET BY MOUTH DAILY  . Lysine 500 MG TABS Take 500 mg by mouth daily.  . metoprolol succinate (TOPROL-XL) 25 MG 24 hr tablet Take 1 tablet (25 mg total) by mouth daily.  . nitroGLYCERIN (NITROSTAT) 0.4 MG SL tablet place 1 tablet under the  tongue every 5 minutes for UP TO 3 doses if needed  . valACYclovir (VALTREX) 1000 MG tablet Take 2 tablets at onset of cold sores and repeat 2 in 12 hours.  Marland Kitchen warfarin (COUMADIN) 5 MG tablet TAKE 1 TABLET EVERY DAY EXCEPT 1/2 TABLET ON SUNDAY AND TUESDAYS OR AS DIRECTED  . levofloxacin (LEVAQUIN) 500 MG tablet Take 1 tablet (500 mg total) by mouth daily. (Patient not taking: Reported on 07/13/2017)   No current facility-administered medications for this visit.  (Other)      REVIEW OF SYSTEMS: ROS    Positive for: Musculoskeletal, Eyes   Negative for: Constitutional, Gastrointestinal, Neurological, Skin, Genitourinary, HENT, Endocrine, Cardiovascular, Respiratory, Psychiatric, Allergic/Imm, Heme/Lymph   Last  edited by Zenovia Jordan, LPN on 08/18/4130  4:40 PM. (History)       ALLERGIES Allergies  Allergen Reactions  . Chlorhexidine Base Itching    CHG WIPES  . Penicillins Hives and Rash    PAST MEDICAL HISTORY Past Medical History:  Diagnosis Date  . ABDOMINAL PAIN RIGHT UPPER QUADRANT 11/08/2008  . ALLERGIC RHINITIS 08/01/2009  . Anticoagulated on Coumadin   . Anxiety   . CAD, NATIVE VESSEL 05/30/2009   moderate risk coronary calcium score by chest CT  . DDD (degenerative disc disease), lumbar   . DEPRESSION 06/13/2008  . DYSLIPIDEMIA 02/27/2009  . DYSPNEA 02/27/2009  . Frequency of urination   . GERD (gastroesophageal reflux disease)   . History of Bell's palsy   . History of DVT of lower extremity 2006-- CHRONIC COUMADIN THERAPY  . History of hiatal hernia   . History of Lyme disease   . 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 Mitral stenosis 11/13/2013   mild by echo  . NON-HODGKIN'S LYMPHOMA, HX OF dx  2005--  chemoradiation completed 2006--  no recurrence   onocologist- dr odogwu--   . OSTEOARTHRITIS, MODERATE 04/07/2010  . Permanent atrial fibrillation ([place])   . Retroperitoneal fibrosis    Past Surgical History:  Procedure Laterality Date  . BREAST MASS EXCISION     BENIGN-- RIGHT BREAST  . CARDIOVASCULAR STRESS TEST  04-04-2009-- PER PT ASYMPTOMATIC-- MEDICAL MANAGEMENT   STUDY WAS EQUIVOCAL/ EF 49%/ GLOBAL HYPOKINESIS/ APPEARS TO BE A MILD ANTERIOR PERFUSION DEFECT COULD REPRESENT ISCHEMIA BUT DUE TO  ACTIVITY WORSE IN STRESS THAN AT REST POSS. THE DEFECT WAS ARTIFACTUAL  . CARDIOVERSION N/A 07/11/2015   Procedure: CARDIOVERSION;  Surgeon: Larey Dresser, MD;  Location: [place];  Service: Cardiovascular;  Laterality: N/A;  . CT ANGIOGRAM  FEB 2011   CALCIUM SCORE 161/ POSS. MODERATE MID LAD STENOSIS  . CYSTOSCOPY W/ RETROGRADES  04/28/2011   Procedure: CYSTOSCOPY WITH RETROGRADE PYELOGRAM;  Surgeon: Fredricka Bonine, MD;   Location: [facility];  Service: Urology;  Laterality: Left;  . CYSTOSCOPY W/ URETERAL STENT REMOVAL  04/28/2011   Procedure: CYSTOSCOPY WITH STENT REMOVAL;  Surgeon: Fredricka Bonine, MD;  Location: [facility];  Service: Urology;;  . EXPLORATORY LAPAROTOMY  2005   LYMPHOMA  . KNEE ARTHROSCOPY  2005   RIGHT  . MULTIPLE CYSTO/ LEFT URETERAL STENT EXCHANGES  LAST ONE 10-28-10  . RETROPERITONEAL BX  2007  . REVERSE SHOULDER ARTHROPLASTY Right 01/31/2015   Procedure: RIGHT REVERSE SHOULDER ARTHROPLASTY;  Surgeon: Justice Britain, MD;  Location: [facility];  Service: Orthopedics;  Laterality: Right;  . TONSILLECTOMY  CHILD  . TOTAL KNEE ARTHROPLASTY  OCT 2010   RIGHT  . TOTAL KNEE ARTHROPLASTY  JAN  2010   LEFT  . TRANSTHORACIC ECHOCARDIOGRAM  [id number]   MILD SYSTOLIC DYSFUNCTION/ EF 73-71%/ MODERATE DIASTOLIC DYSFUCTION/ MILD MR/ MILD BIATRIAL ENLARGEMENT    FAMILY HISTORY Family History  Problem Relation Age of Onset  . Arthritis Mother   . Heart disease Mother   . Heart attack Mother   . Diabetes Sister   . Hypertension Sister   . Stroke Sister   . Heart attack Sister     SOCIAL HISTORY Social History   Tobacco Use  . Smoking status: Former Smoker    Packs/day: 2.00    Years: 52.00    Pack years: 104.00    Types: Cigarettes    Last attempt to quit: 03/24/2008    Years since quitting: 9.3  . Smokeless tobacco: Never Used  Substance Use Topics  . Alcohol use: Yes    Comment: RARE  . Drug use: No         OPHTHALMIC EXAM:  Base Eye Exam    Visual Acuity (Snellen - Linear)      Right Left   Dist cc 20/80 20/70+2   Dist ph cc 20/60 20/60   Correction:  Glasses       Tonometry (Tonopen, 2:10 PM)      Right Left   Pressure 13 14       Pupils      Dark Light Shape React APD   Right 5 3 Round Brisk None   Left 5 3 Round Brisk None       Visual Fields      Left Right    Full Full       Extraocular Movement      Right Left     Full, Ortho Full, Ortho       Neuro/Psych    Oriented x3:  Yes   Mood/Affect:  Normal       Dilation    Both eyes:  1.0% Mydriacyl, Paremyd @ 2:10 PM        Slit Lamp and Fundus Exam    External Exam      Right Left   External Periorbital edema        Slit Lamp Exam      Right Left   Lids/Lashes Dermatochalasis - upper lid Dermatochalasis - upper lid,  Mild Telangiectasia   Conjunctiva/Sclera White and quiet White and quiet   Cornea Arcus, trace Punctate epithelial erosions Arcus, 2+ fine Punctate epithelial erosions   Anterior Chamber Deep and quiet Deep and quiet   Iris Round and dilated Round and dilated   Lens 3+ Nuclear sclerosis, 2+ Cortical cataract, mild brunescence  3+ Nuclear sclerosis, 2+ Cortical cataract, +brunescence   Vitreous Vitreous syneresis, gas bubble gone, mild pigment in the anterior vitreous, Posterior vitreous detachment Posterior vitreous detachment, Vitreous syneresis       Fundus Exam      Right Left   Disc Temporal Peripapillary atrophy, otherwise normal Mild Temporal Peripapillary atrophy, compact   C/D Ratio 0.35 0.3   Macula macula reattached with pockets of residual SRF improving; Blunted foveal reflex, Pigment clumping, pigmented demarcation line inside inferior arcades, Trace SRF inferior,  attached; drusen; RPE mottling and clumping   Vessels Vascular attenuation Vascular attenuation   Periphery ST retina attached, small horseshoe tear at 1030, large horseshoe tear at 1100 - good cryo changes surrounding, SRF is improved, pigmented demarcation line along 0730 border Attached, mild scattered peripheral cystoid degeneration, peripheral drusen  IMAGING AND PROCEDURES  Imaging and Procedures for 07/14/17  OCT, Retina - OU - Both Eyes       Right Eye Quality was good. Central Foveal Thickness: 269. Progression has improved. Findings include subretinal fluid, epiretinal membrane, no IRF, normal foveal contour (Interval  improvement in SRF).   Left Eye Quality was good. Central Foveal Thickness: 277. Progression has been stable. Findings include normal foveal contour, no IRF, no SRF, retinal drusen , outer retinal atrophy, subretinal hyper-reflective material, epiretinal membrane (Thin choriod).   Notes *Images captured and stored on drive  Diagnosis / Impression:  OD: retina re-attached, trace residual SRF greatest inferior / temporal -- mild interval improvement in central SRF from prior OS: Non-Exudative AMD  Clinical management:  See below  Abbreviations: NFP - Normal foveal profile. CME - cystoid macular edema. PED - pigment epithelial detachment. IRF - intraretinal fluid. SRF - subretinal fluid. EZ - ellipsoid zone. ERM - epiretinal membrane. ORA - outer retinal atrophy. ORT - outer retinal tubulation. SRHM - subretinal hyper-reflective material                  ASSESSMENT/PLAN:    ICD-10-CM   1. Right retinal detachment H33.21 OCT, Retina - OU - Both Eyes  2. Retinal edema H35.81   3. Intermediate stage nonexudative age-related macular degeneration of both eyes H35.3132   4. Combined form of age-related cataract, both eyes H25.813     1. Retinal detachment, OD-  - initial presentation: macula- and fovea-involving rhegmatogenous retinal detachment - initial OCT showed IRF/cystic changes suggestive of chronic detachment - supero-temporal detachment from 0730 to 1200 with small horseshoe tear located at 1030 and large horseshoe tear located at 1100 - s/p pneumatic cryopexy OD (02.04.19) - good cryo changes present and retina reattached - pockets of SRF persistent in macula, worst inferiorly but improving - BCVA relatively stable at 20/60 OD - pt reported moderate compliance with positioning when bubble was in place - F/U 8 weeks  2. Retinal edema  - as discussed above, detached retina had cystic changes / IRF suggestive of chronic detachment -- now improved post pneumatic  cryopexy - residual SRF improving  3. Age related macular degeneration, non-exudative, both eyes  - The incidence, anatomy, and pathology of dry AMD, risk of progression, and the AREDS and AREDS 2 study including smoking risks discussed with patient.  - Recommend amsler grid monitoring  - continue present management  4. Combined form age-related cataract OU-  - The symptoms of cataract, surgical options, and treatments and risks were discussed with patient. - discussed diagnosis and progression - visually significant - cleared from retina standpoint to pursue cataract surgery OS - OD will be ready around May/June 2019 (3-4 mos post pneumatic cryopexy) - will refer to expert cataract surgeon, Dr. Quentin Ore, for cataract eval   Ophthalmic Meds Ordered this visit:  No orders of the defined types were placed in this encounter.      Return in about 8 weeks (around 09/07/2017) for Dilated Exam, OCT, POV.  There are no Patient Instructions on file for this visit.   Explained the diagnoses, plan, and follow up with the patient and they expressed understanding.  Patient expressed understanding of the importance of proper follow up care.   This document serves as a record of services personally performed by Gardiner Sleeper, MD, PhD. It was created on their behalf by Catha Brow, [place], a certified ophthalmic assistant. The creation of this record is the provider's dictation  and/or activities during the visit.  Electronically signed by: Catha Brow, [place]  07/14/17 12:52 AM   Gardiner Sleeper, M.D., Ph.D. Diseases & Surgery of the Retina and [place] 07/14/17  I have reviewed the above documentation for accuracy and completeness, and I agree with the above. Gardiner Sleeper, M.D., Ph.D. 07/14/17 12:54 AM    Abbreviations: M myopia (nearsighted); A astigmatism; H hyperopia (farsighted); P presbyopia; Mrx spectacle prescription;  CTL contact  lenses; OD right eye; OS left eye; OU both eyes  XT exotropia; ET esotropia; PEK punctate epithelial keratitis; PEE punctate epithelial erosions; DES dry eye syndrome; MGD meibomian gland dysfunction; ATs artificial tears; PFAT's preservative free artificial tears; [place] nuclear sclerotic cataract; PSC posterior subcapsular cataract; ERM epi-retinal membrane; PVD posterior vitreous detachment; RD retinal detachment; DM diabetes mellitus; DR diabetic retinopathy; NPDR non-proliferative diabetic retinopathy; PDR proliferative diabetic retinopathy; CSME clinically significant macular edema; DME diabetic macular edema; dbh dot blot hemorrhages; CWS cotton wool spot; POAG primary open angle glaucoma; C/D cup-to-disc ratio; HVF humphrey visual field; GVF goldmann visual field; OCT optical coherence tomography; IOP intraocular pressure; BRVO Branch retinal vein occlusion; CRVO central retinal vein occlusion; CRAO central retinal artery occlusion; BRAO branch retinal artery occlusion; RT retinal tear; SB scleral buckle; PPV pars plana vitrectomy; VH Vitreous hemorrhage; PRP panretinal laser photocoagulation; IVK intravitreal kenalog; VMT vitreomacular traction; MH Macular hole;  NVD neovascularization of the disc; NVE neovascularization elsewhere; AREDS age related eye disease study; ARMD age related macular degeneration; POAG primary open angle glaucoma; EBMD epithelial/anterior basement membrane dystrophy; ACIOL anterior chamber intraocular lens; IOL intraocular lens; PCIOL posterior chamber intraocular lens; Phaco/IOL phacoemulsification with intraocular lens placement; [place] photorefractive keratectomy; LASIK laser assisted in situ keratomileusis; HTN hypertension; DM diabetes mellitus; COPD chronic obstructive pulmonary disease

## 2017-07-13 ENCOUNTER — Encounter (INDEPENDENT_AMBULATORY_CARE_PROVIDER_SITE_OTHER): Payer: Self-pay | Admitting: Ophthalmology

## 2017-07-13 ENCOUNTER — Ambulatory Visit (INDEPENDENT_AMBULATORY_CARE_PROVIDER_SITE_OTHER): Payer: Medicare Other | Admitting: Ophthalmology

## 2017-07-13 DIAGNOSIS — H3581 Retinal edema: Secondary | ICD-10-CM

## 2017-07-13 DIAGNOSIS — H3321 Serous retinal detachment, right eye: Secondary | ICD-10-CM

## 2017-07-13 DIAGNOSIS — H25813 Combined forms of age-related cataract, bilateral: Secondary | ICD-10-CM

## 2017-07-13 DIAGNOSIS — H353132 Nonexudative age-related macular degeneration, bilateral, intermediate dry stage: Secondary | ICD-10-CM

## 2017-07-14 ENCOUNTER — Encounter (INDEPENDENT_AMBULATORY_CARE_PROVIDER_SITE_OTHER): Payer: Self-pay | Admitting: Ophthalmology

## 2017-07-14 ENCOUNTER — Other Ambulatory Visit: Payer: Self-pay | Admitting: Family Medicine

## 2017-07-14 NOTE — Telephone Encounter (Signed)
Refill with 2 additional refills. 

## 2017-07-14 NOTE — Telephone Encounter (Signed)
Last OV 06/11/2017   Last refilled 04/16/2017 disp 90 with 2 refills   Sent to PCP for approval

## 2017-07-15 ENCOUNTER — Other Ambulatory Visit: Payer: Self-pay

## 2017-07-15 ENCOUNTER — Ambulatory Visit: Payer: Medicare Other | Admitting: *Deleted

## 2017-07-15 DIAGNOSIS — Z7901 Long term (current) use of anticoagulants: Secondary | ICD-10-CM | POA: Diagnosis not present

## 2017-07-15 DIAGNOSIS — Z5181 Encounter for therapeutic drug level monitoring: Secondary | ICD-10-CM

## 2017-07-15 DIAGNOSIS — I824Y9 Acute embolism and thrombosis of unspecified deep veins of unspecified proximal lower extremity: Secondary | ICD-10-CM | POA: Diagnosis not present

## 2017-07-15 LAB — POCT INR: INR: 2.9

## 2017-07-15 MED ORDER — WARFARIN SODIUM 5 MG PO TABS: ORAL_TABLET | ORAL | 0 refills | Status: DC

## 2017-07-15 NOTE — Patient Instructions (Signed)
Description   Continue taking same dosage 1 tablet (5mg ) every day except 1/2 tablet (2.5mg ) on Sundays and Tuesdays.  Recheck INR in 6 weeks.

## 2017-07-15 NOTE — Addendum Note (Signed)
Addended by: SUPPLE, MEGAN E on: 07/15/2017 03:36 PM   Modules accepted: Orders

## 2017-07-17 ENCOUNTER — Other Ambulatory Visit: Payer: Self-pay | Admitting: Family Medicine

## 2017-08-11 ENCOUNTER — Other Ambulatory Visit: Payer: Self-pay | Admitting: Cardiology

## 2017-08-26 ENCOUNTER — Ambulatory Visit: Payer: Medicare Other

## 2017-08-26 DIAGNOSIS — I824Y9 Acute embolism and thrombosis of unspecified deep veins of unspecified proximal lower extremity: Secondary | ICD-10-CM | POA: Diagnosis not present

## 2017-08-26 DIAGNOSIS — Z7901 Long term (current) use of anticoagulants: Secondary | ICD-10-CM

## 2017-08-26 DIAGNOSIS — Z5181 Encounter for therapeutic drug level monitoring: Secondary | ICD-10-CM

## 2017-08-26 LAB — POCT INR: INR: 2.8 (ref 2.0–3.0)

## 2017-08-26 NOTE — Patient Instructions (Signed)
Description   Continue taking same dosage 1 tablet (5mg ) every day except 1/2 tablet (2.5mg ) on Sundays and Tuesdays.  Recheck INR in 6 weeks.

## 2017-09-09 ENCOUNTER — Other Ambulatory Visit: Payer: Self-pay | Admitting: Cardiology

## 2017-09-15 DIAGNOSIS — N261 Atrophy of kidney (terminal): Secondary | ICD-10-CM | POA: Diagnosis not present

## 2017-09-15 DIAGNOSIS — R1084 Generalized abdominal pain: Secondary | ICD-10-CM | POA: Diagnosis not present

## 2017-09-28 ENCOUNTER — Encounter (INDEPENDENT_AMBULATORY_CARE_PROVIDER_SITE_OTHER): Payer: Medicare Other | Admitting: Ophthalmology

## 2017-09-30 ENCOUNTER — Other Ambulatory Visit: Payer: Self-pay | Admitting: Family Medicine

## 2017-10-05 ENCOUNTER — Telehealth (INDEPENDENT_AMBULATORY_CARE_PROVIDER_SITE_OTHER): Payer: Self-pay

## 2017-10-05 NOTE — Telephone Encounter (Signed)
Pt has appointment with Dr. Kathlen Mody for cataract eval on 07.23.19 @ 1:30PM;   Catha Brow, [place]

## 2017-10-07 ENCOUNTER — Ambulatory Visit: Payer: Medicare Other | Admitting: *Deleted

## 2017-10-07 DIAGNOSIS — I824Y9 Acute embolism and thrombosis of unspecified deep veins of unspecified proximal lower extremity: Secondary | ICD-10-CM | POA: Diagnosis not present

## 2017-10-07 DIAGNOSIS — I4821 Permanent atrial fibrillation: Secondary | ICD-10-CM

## 2017-10-07 DIAGNOSIS — Z5181 Encounter for therapeutic drug level monitoring: Secondary | ICD-10-CM | POA: Diagnosis not present

## 2017-10-07 DIAGNOSIS — Z7901 Long term (current) use of anticoagulants: Secondary | ICD-10-CM

## 2017-10-07 DIAGNOSIS — I482 Chronic atrial fibrillation: Secondary | ICD-10-CM | POA: Diagnosis not present

## 2017-10-07 LAB — POCT INR: INR: 1.8 — AB (ref 2.0–3.0)

## 2017-10-07 NOTE — Patient Instructions (Signed)
Description   Today July 18th take 1 and 1/2 tablets (7.5mg )  of coumadin then continue taking same dosage 1 tablet (5mg ) every day except 1/2 tablet (2.5mg ) on Sundays and Tuesdays.  Recheck INR in 2 weeks.

## 2017-10-10 ENCOUNTER — Other Ambulatory Visit: Payer: Self-pay | Admitting: Family Medicine

## 2017-10-11 NOTE — Telephone Encounter (Signed)
Last refill 07/15/17 and last office visit 06/11/17.  Okay to refill?

## 2017-10-12 DIAGNOSIS — H2511 Age-related nuclear cataract, right eye: Secondary | ICD-10-CM | POA: Diagnosis not present

## 2017-10-12 DIAGNOSIS — H25013 Cortical age-related cataract, bilateral: Secondary | ICD-10-CM | POA: Diagnosis not present

## 2017-10-12 DIAGNOSIS — H353131 Nonexudative age-related macular degeneration, bilateral, early dry stage: Secondary | ICD-10-CM | POA: Diagnosis not present

## 2017-10-12 DIAGNOSIS — Z8669 Personal history of other diseases of the nervous system and sense organs: Secondary | ICD-10-CM | POA: Diagnosis not present

## 2017-10-12 DIAGNOSIS — H2513 Age-related nuclear cataract, bilateral: Secondary | ICD-10-CM | POA: Diagnosis not present

## 2017-10-12 NOTE — Telephone Encounter (Signed)
Refill with 2 additional refills. 

## 2017-10-19 ENCOUNTER — Other Ambulatory Visit: Payer: Self-pay | Admitting: *Deleted

## 2017-10-19 MED ORDER — FUROSEMIDE 20 MG PO TABS: 20.0000 mg | ORAL_TABLET | Freq: Every day | ORAL | 0 refills | Status: DC

## 2017-10-21 ENCOUNTER — Ambulatory Visit: Payer: Medicare Other | Admitting: *Deleted

## 2017-10-21 DIAGNOSIS — Z5181 Encounter for therapeutic drug level monitoring: Secondary | ICD-10-CM | POA: Diagnosis not present

## 2017-10-21 DIAGNOSIS — I824Y9 Acute embolism and thrombosis of unspecified deep veins of unspecified proximal lower extremity: Secondary | ICD-10-CM | POA: Diagnosis not present

## 2017-10-21 DIAGNOSIS — Z7901 Long term (current) use of anticoagulants: Secondary | ICD-10-CM

## 2017-10-21 LAB — POCT INR: INR: 2.7 (ref 2.0–3.0)

## 2017-10-21 NOTE — Patient Instructions (Signed)
Description   Continue taking same dosage 1 tablet (5mg ) every day except 1/2 tablet (2.5mg ) on Sundays and Tuesdays.  Recheck INR in 4 weeks.

## 2017-10-27 DIAGNOSIS — H2511 Age-related nuclear cataract, right eye: Secondary | ICD-10-CM | POA: Diagnosis not present

## 2017-10-27 DIAGNOSIS — H25011 Cortical age-related cataract, right eye: Secondary | ICD-10-CM | POA: Diagnosis not present

## 2017-10-30 ENCOUNTER — Other Ambulatory Visit: Payer: Self-pay | Admitting: Family Medicine

## 2017-11-07 ENCOUNTER — Other Ambulatory Visit: Payer: Self-pay | Admitting: Cardiology

## 2017-11-07 DIAGNOSIS — I4819 Other persistent atrial fibrillation: Secondary | ICD-10-CM

## 2017-11-23 DIAGNOSIS — H25012 Cortical age-related cataract, left eye: Secondary | ICD-10-CM | POA: Diagnosis not present

## 2017-11-23 DIAGNOSIS — H2512 Age-related nuclear cataract, left eye: Secondary | ICD-10-CM | POA: Diagnosis not present

## 2017-12-10 DIAGNOSIS — R1032 Left lower quadrant pain: Secondary | ICD-10-CM | POA: Diagnosis not present

## 2017-12-13 ENCOUNTER — Other Ambulatory Visit: Payer: Self-pay | Admitting: Urology

## 2017-12-13 DIAGNOSIS — N183 Chronic kidney disease, stage 3 unspecified: Secondary | ICD-10-CM

## 2017-12-24 ENCOUNTER — Encounter: Payer: Self-pay | Admitting: Family Medicine

## 2017-12-24 ENCOUNTER — Ambulatory Visit (INDEPENDENT_AMBULATORY_CARE_PROVIDER_SITE_OTHER): Payer: Medicare Other | Admitting: Family Medicine

## 2017-12-24 ENCOUNTER — Other Ambulatory Visit: Payer: Self-pay

## 2017-12-24 VITALS — BP 132/82 | HR 64 | Temp 97.7°F | Ht 66.0 in | Wt 228.1 lb

## 2017-12-24 DIAGNOSIS — M25512 Pain in left shoulder: Secondary | ICD-10-CM | POA: Diagnosis not present

## 2017-12-24 DIAGNOSIS — Z23 Encounter for immunization: Secondary | ICD-10-CM | POA: Diagnosis not present

## 2017-12-24 MED ORDER — METHYLPREDNISOLONE ACETATE 40 MG/ML IJ SUSP
40.0000 mg | Freq: Once | INTRAMUSCULAR | Status: AC
  Administered 2017-12-24: 40 mg via INTRAMUSCULAR

## 2017-12-24 NOTE — Progress Notes (Signed)
Subjective:     Patient ID: [name], female   DOB: 1945/10/27, 72 y.o.   MRN: [id number]  HPI Patient is seen with left shoulder pain.  Duration about 6 months.  Denies injury.  She has achy dull pain which is worse with internal rotation and slightly with abduction.  No neck pain.  No radiculitis symptoms.  No chest pain.  Some pain at night.  She tried topical lidocaine without much improvement.  She avoids nonsteroidals because of her chronic Coumadin.  Pain is moderate.  Not improving over several months.  Pain especially worse with internal rotation  Past Medical History:  Diagnosis Date  . ABDOMINAL PAIN RIGHT UPPER QUADRANT 11/08/2008  . ALLERGIC RHINITIS 08/01/2009  . Anticoagulated on Coumadin   . Anxiety   . CAD, NATIVE VESSEL 05/30/2009   moderate risk coronary calcium score by chest CT  . DDD (degenerative disc disease), lumbar   . DEPRESSION 06/13/2008  . DYSLIPIDEMIA 02/27/2009  . DYSPNEA 02/27/2009  . Frequency of urination   . GERD (gastroesophageal reflux disease)   . History of Bell's palsy   . History of DVT of lower extremity 2006-- CHRONIC COUMADIN THERAPY  . History of hiatal hernia   . History of Lyme disease   . 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 Mitral stenosis 11/13/2013   mild by echo  . NON-HODGKIN'S LYMPHOMA, HX OF dx  2005--  chemoradiation completed 2006--  no recurrence   onocologist- dr odogwu--   . OSTEOARTHRITIS, MODERATE 04/07/2010  . Permanent atrial fibrillation   . Retroperitoneal fibrosis    Past Surgical History:  Procedure Laterality Date  . BREAST MASS EXCISION     BENIGN-- RIGHT BREAST  . CARDIOVASCULAR STRESS TEST  04-04-2009-- PER PT ASYMPTOMATIC-- MEDICAL MANAGEMENT   STUDY WAS EQUIVOCAL/ EF 49%/ GLOBAL HYPOKINESIS/ APPEARS TO BE A MILD ANTERIOR PERFUSION DEFECT COULD REPRESENT ISCHEMIA BUT DUE TO  ACTIVITY WORSE IN STRESS THAN AT REST POSS. THE DEFECT WAS ARTIFACTUAL  . CARDIOVERSION N/A  07/11/2015   Procedure: CARDIOVERSION;  Surgeon: Larey Dresser, MD;  Location: [facility];  Service: Cardiovascular;  Laterality: N/A;  . CT ANGIOGRAM  FEB 2011   CALCIUM SCORE 161/ POSS. MODERATE MID LAD STENOSIS  . CYSTOSCOPY W/ RETROGRADES  04/28/2011   Procedure: CYSTOSCOPY WITH RETROGRADE PYELOGRAM;  Surgeon: Fredricka Bonine, MD;  Location: [facility];  Service: Urology;  Laterality: Left;  . CYSTOSCOPY W/ URETERAL STENT REMOVAL  04/28/2011   Procedure: CYSTOSCOPY WITH STENT REMOVAL;  Surgeon: Fredricka Bonine, MD;  Location: [facility];  Service: Urology;;  . EXPLORATORY LAPAROTOMY  2005   LYMPHOMA  . KNEE ARTHROSCOPY  2005   RIGHT  . MULTIPLE CYSTO/ LEFT URETERAL STENT EXCHANGES  LAST ONE 10-28-10  . RETROPERITONEAL BX  2007  . REVERSE SHOULDER ARTHROPLASTY Right 01/31/2015   Procedure: RIGHT REVERSE SHOULDER ARTHROPLASTY;  Surgeon: Justice Britain, MD;  Location: [place];  Service: Orthopedics;  Laterality: Right;  . TONSILLECTOMY  CHILD  . TOTAL KNEE ARTHROPLASTY  OCT 2010   RIGHT  . TOTAL KNEE ARTHROPLASTY  JAN 2010   LEFT  . TRANSTHORACIC ECHOCARDIOGRAM  [id number]   MILD SYSTOLIC DYSFUNCTION/ EF 63-01%/ MODERATE DIASTOLIC DYSFUCTION/ MILD MR/ MILD BIATRIAL ENLARGEMENT    reports that she quit smoking about 9 years ago. Her smoking use included cigarettes. She has a 104.00 pack-year smoking history. She has never used smokeless tobacco. She reports that she drinks alcohol. She reports  that she does not use drugs. family history includes Arthritis in her mother; Diabetes in her sister; Heart attack in her mother and sister; Heart disease in her mother; Hypertension in her sister; Stroke in her sister. Allergies  Allergen Reactions  . Chlorhexidine Base Itching    CHG WIPES  . Penicillins Hives and Rash     Review of Systems  Cardiovascular: Negative for chest pain.  Musculoskeletal: Negative for neck pain.  Neurological: Negative  for weakness and numbness.       Objective:   Physical Exam  Constitutional: She appears well-developed and well-nourished.  Cardiovascular: Normal rate and regular rhythm.  Pulmonary/Chest: Effort normal and breath sounds normal.  Musculoskeletal:  Patient has no localized shoulder tenderness.  She has pain left shoulder with internal rotation.  Positive empty can test.  No acromioclavicular tenderness.  Neurological:  No rotator cuff weakness.  Full strength upper extremities.       Assessment:     Left shoulder pain of 6 months duration.  Suspect impingement syndrome.  Not improved with conservative measures.  At risk for adhesive capsulitis.    Plan:      Discussed risks and benefits of corticosteroid injection and patient consented.  After prepping skin with betadine, injected 40 mg depomedrol and 2 cc of plain xylocaine with 25 gauge one and one half inch needle using posterior lateral approach and pt tolerated well. -She will try some icing next few days -ROM exercises recommended -touch base if pain no better in 2 weeks -flu vaccine given.  [name] MD [facility]

## 2017-12-24 NOTE — Patient Instructions (Signed)
Shoulder Impingement Syndrome Shoulder impingement syndrome is a condition that causes pain when connective tissues (tendons) surrounding the shoulder joint become pinched. These tendons are part of the group of muscles and tissues that help to stabilize the shoulder (rotator cuff). Beneath the rotator cuff is a fluid-filled sac (bursa) that allows the muscles and tendons to glide smoothly. The bursa may become swollen or irritated (bursitis). Bursitis, swelling in the rotator cuff tendons, or both conditions can decrease how much space is under a bone in the shoulder joint (acromion), resulting in impingement. What are the causes? Shoulder impingement syndrome can be caused by bursitis or swelling of the rotator cuff tendons, which may result from:  Repetitive overhead arm movements.  Falling onto the shoulder.  Weakness in the shoulder muscles.  What increases the risk? You may be more likely to develop this condition if you are an athlete who participates in:  Sports that involve throwing, such as baseball.  Tennis.  Swimming.  Volleyball.  Some people are also more likely to develop impingement syndrome because of the shape of their acromion bone. What are the signs or symptoms? The main symptom of this condition is pain on the front or side of the shoulder. Pain may:  Get worse when lifting or raising the arm.  Get worse at night.  Wake you up from sleeping.  Feel sharp when the shoulder is moved, and then fade to an ache.  Other signs and symptoms may include:  Tenderness.  Stiffness.  Inability to raise the arm above shoulder level or behind the body.  Weakness.  How is this diagnosed? This condition may be diagnosed based on:  Your symptoms.  Your medical history.  A physical exam.  Imaging tests, such as: ? X-rays. ? MRI. ? Ultrasound.  How is this treated? Treatment for this condition may include:  Resting your shoulder and avoiding all  activities that cause pain or put stress on the shoulder.  Icing your shoulder.  NSAIDs to help reduce pain and swelling.  One or more injections of medicines to numb the area and reduce inflammation.  Physical therapy.  Surgery. This may be needed if nonsurgical treatments have not helped. Surgery may involve repairing the rotator cuff, reshaping the acromion, or removing the bursa.  Follow these instructions at home: Managing pain, stiffness, and swelling  If directed, apply ice to the injured area. ? Put ice in a plastic bag. ? Place a towel between your skin and the bag. ? Leave the ice on for 20 minutes, 2-3 times a day. Activity  Rest and return to your normal activities as told by your health care provider. Ask your health care provider what activities are safe for you.  Do exercises as told by your health care provider. General instructions  Do not use any tobacco products, including cigarettes, chewing tobacco, or e-cigarettes. Tobacco can delay healing. If you need help quitting, ask your health care provider.  Ask your health care provider when it is safe for you to drive.  Take over-the-counter and prescription medicines only as told by your health care provider.  Keep all follow-up visits as told by your health care provider. This is important. How is this prevented?  Give your body time to rest between periods of activity.  Be safe and responsible while being active to avoid falls.  Maintain physical fitness, including strength and flexibility. Contact a health care provider if:  Your symptoms have not improved after 1-2 months of treatment and   rest.  You cannot lift your arm away from your body. This information is not intended to replace advice given to you by your health care provider. Make sure you discuss any questions you have with your health care provider. Document Released: 03/09/2005 Document Revised: 11/14/2015 Document Reviewed:  02/09/2015 Elsevier Interactive Patient Education  2018 [employer]  

## 2018-01-10 ENCOUNTER — Other Ambulatory Visit: Payer: Self-pay | Admitting: Family Medicine

## 2018-01-10 NOTE — Telephone Encounter (Signed)
Last OV 12/24/2017, No future OV  Last filled 10/12/17, # 90 with 2 refills

## 2018-01-11 NOTE — Telephone Encounter (Signed)
Refilled with 2 additional refills 

## 2018-01-13 ENCOUNTER — Telehealth: Payer: Self-pay | Admitting: Cardiology

## 2018-01-13 NOTE — Telephone Encounter (Signed)
Advised the patient, to call pharmacy to determine if the manufacture has the recall.

## 2018-01-13 NOTE — Telephone Encounter (Signed)
New Message   Pt c/o medication issue:  1. Name of Medication: losartan (COZAAR) 25 MG tablet  2. How are you currently taking this medication (dosage and times per day)? TAKE 1 TABLET BY MOUTH DAILY  3. Are you having a reaction (difficulty breathing--STAT)? no  4. What is your medication issue? Pt states that she seen this medication causes cancer and wants to know what she should do about taking it. Please call

## 2018-01-20 ENCOUNTER — Other Ambulatory Visit: Payer: Self-pay | Admitting: Family Medicine

## 2018-01-25 ENCOUNTER — Ambulatory Visit (INDEPENDENT_AMBULATORY_CARE_PROVIDER_SITE_OTHER): Payer: Medicare Other | Admitting: Cardiology

## 2018-01-25 ENCOUNTER — Encounter: Payer: Self-pay | Admitting: Cardiology

## 2018-01-25 VITALS — BP 96/62 | HR 53 | Ht 66.0 in | Wt 229.8 lb

## 2018-01-25 DIAGNOSIS — E7800 Pure hypercholesterolemia, unspecified: Secondary | ICD-10-CM

## 2018-01-25 DIAGNOSIS — I10 Essential (primary) hypertension: Secondary | ICD-10-CM | POA: Diagnosis not present

## 2018-01-25 DIAGNOSIS — I5032 Chronic diastolic (congestive) heart failure: Secondary | ICD-10-CM

## 2018-01-25 DIAGNOSIS — I4821 Permanent atrial fibrillation: Secondary | ICD-10-CM | POA: Diagnosis not present

## 2018-01-25 DIAGNOSIS — I2510 Atherosclerotic heart disease of native coronary artery without angina pectoris: Secondary | ICD-10-CM | POA: Diagnosis not present

## 2018-01-25 DIAGNOSIS — I420 Dilated cardiomyopathy: Secondary | ICD-10-CM | POA: Diagnosis not present

## 2018-01-25 DIAGNOSIS — I050 Rheumatic mitral stenosis: Secondary | ICD-10-CM

## 2018-01-25 MED ORDER — DILTIAZEM HCL ER COATED BEADS 180 MG PO CP24: 180.0000 mg | ORAL_CAPSULE | Freq: Every day | ORAL | 3 refills | Status: DC

## 2018-01-25 NOTE — Patient Instructions (Signed)
Medication Instructions:  Decrease: Cardizem 180 mg, daily If you need a refill on your cardiac medications before your next appointment, please call your pharmacy.   Lab work: Future: 11/12: Fasting lab:  BMET, LFT and lipids If you have labs (blood work) drawn today and your tests are completely normal, you will receive your results only by: Marland Kitchen MyChart Message (if you have MyChart) OR . A paper copy in the mail If you have any lab test that is abnormal or we need to change your treatment, we will call you to review the results.  Follow-Up: At [facility], you and your health needs are our priority.  As part of our continuing mission to provide you with exceptional heart care, we have created designated Provider Care Teams.  These Care Teams include your primary Cardiologist (physician) and Advanced Practice Providers (APPs -  Physician Assistants and Nurse Practitioners) who all work together to provide you with the care you need, when you need it.  Your physician recommends that you schedule a follow-up appointment in: 4 week with PA.   You will need a follow up appointment in 6 months.  Please call our office 2 months in advance to schedule this appointment.  You may see Dr. Radford Pax or one of the following Advanced Practice Providers on your designated Care Team:   [place], PA-C Melina Copa, PA-C . Ermalinda Barrios, PA-C

## 2018-01-25 NOTE — Progress Notes (Signed)
Cardiology Office Note:    Date:  01/25/2018   ID:  [name], DOB 08-14-1945, MRN [id number]  PCP:  [name], MD  Cardiologist:  No primary care provider on file.    Referring MD: [name], MD   Chief Complaint  Patient presents with  . Atrial Fibrillation  . Hypertension  . Aortic Stenosis  . Congestive Heart Failure  . Hyperlipidemia    History of Present Illness:    [name] is a 72 y.o. female with a hx of retroperitoneal fibrosis and non-Hodgkin's lymphoma as well as prior DVT.  She has a history of coronary artery calcifications with a moderate calcium score of 161 (putting her at a high risk percentile for her age) and a possible moderate mid LAD stenosis (though some degradation of images by respiratory artifact). Echo showed mildly reduced  LV systolic function (reported as mildly decreased on myoview),  EF 45-50% with moderate diastolic dysfunction. The patient opted for medical therapy.  She was diagnosed with paroxysmal atrial fibrillation and was started on warfarin and diltiazem CD. Last echo in 8/15 showed EF 50-55%, moderate LV hypertrophy, probably mild mitral stenosis, normal RV size and systolic function. She was initially on dronedarone to try to maintain NSR, but developed persistent atrial fibrillation. She was changed to amiodarone and underwent cardioversion to NSR in 4/17 but went back into atrial fibrillation.  The decision was made to pursue rate control and her amio was stopped.  She also has a history of chronic diastolic CHF triggered by afib in the past.    She is here today for followup and is doing well.  She denies any chest pain or pressure, PND, orthopnea, LE edema, dizziness, palpitations or syncope. She has chronic DOE that occurs when she is hurrying and has had for some time.  She is compliant with her meds and is tolerating meds with no SE.    Past Medical History:  Diagnosis Date  . ABDOMINAL PAIN RIGHT  UPPER QUADRANT 11/08/2008  . ALLERGIC RHINITIS 08/01/2009  . Anxiety   . Coronary artery calcification seen on CAT scan 05/30/2009   moderate risk coronary calcium score by chest CT  . DDD (degenerative disc disease), lumbar   . DEPRESSION 06/13/2008  . DYSLIPIDEMIA 02/27/2009  . DYSPNEA 02/27/2009  . Frequency of urination   . GERD (gastroesophageal reflux disease)   . History of Bell's palsy   . History of DVT of lower extremity 2006-- CHRONIC COUMADIN THERAPY  . History of hiatal hernia   . History of Lyme disease   . 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 NON-HODGKIN'S LYMPHOMA, HX OF dx  2005--  chemoradiation completed 2006--  no recurrence   onocologist- dr odogwu--   . OSTEOARTHRITIS, MODERATE 04/07/2010  . Permanent atrial fibrillation    On chronic anti-coagulation with warfarin  . Retroperitoneal fibrosis     Past Surgical History:  Procedure Laterality Date  . BREAST MASS EXCISION     BENIGN-- RIGHT BREAST  . CARDIOVASCULAR STRESS TEST  04-04-2009-- PER PT ASYMPTOMATIC-- MEDICAL MANAGEMENT   STUDY WAS EQUIVOCAL/ EF 49%/ GLOBAL HYPOKINESIS/ APPEARS TO BE A MILD ANTERIOR PERFUSION DEFECT COULD REPRESENT ISCHEMIA BUT DUE TO  ACTIVITY WORSE IN STRESS THAN AT REST POSS. THE DEFECT WAS ARTIFACTUAL  . CARDIOVERSION N/A 07/11/2015   Procedure: CARDIOVERSION;  Surgeon: Larey Dresser, MD;  Location: [facility];  Service: Cardiovascular;  Laterality: N/A;  . CT ANGIOGRAM  FEB 2011  CALCIUM SCORE 161/ POSS. MODERATE MID LAD STENOSIS  . CYSTOSCOPY W/ RETROGRADES  04/28/2011   Procedure: CYSTOSCOPY WITH RETROGRADE PYELOGRAM;  Surgeon: Fredricka Bonine, MD;  Location: [facility];  Service: Urology;  Laterality: Left;  . CYSTOSCOPY W/ URETERAL STENT REMOVAL  04/28/2011   Procedure: CYSTOSCOPY WITH STENT REMOVAL;  Surgeon: Fredricka Bonine, MD;  Location: [facility];  Service: Urology;;  . EXPLORATORY  LAPAROTOMY  2005   LYMPHOMA  . KNEE ARTHROSCOPY  2005   RIGHT  . MULTIPLE CYSTO/ LEFT URETERAL STENT EXCHANGES  LAST ONE 10-28-10  . RETROPERITONEAL BX  2007  . REVERSE SHOULDER ARTHROPLASTY Right 01/31/2015   Procedure: RIGHT REVERSE SHOULDER ARTHROPLASTY;  Surgeon: Justice Britain, MD;  Location: [facility];  Service: Orthopedics;  Laterality: Right;  . TONSILLECTOMY  CHILD  . TOTAL KNEE ARTHROPLASTY  OCT 2010   RIGHT  . TOTAL KNEE ARTHROPLASTY  JAN 2010   LEFT  . TRANSTHORACIC ECHOCARDIOGRAM  [id number]   MILD SYSTOLIC DYSFUNCTION/ EF 16-01%/ MODERATE DIASTOLIC DYSFUCTION/ MILD MR/ MILD BIATRIAL ENLARGEMENT    Current Medications: Current Meds  Medication Sig  . ALPRAZolam (XANAX) 1 MG tablet TAKE 1 TABLET BY MOUTH THREE TIMES A DAY AS NEEDED  . b complex vitamins capsule Take 2 capsules by mouth daily.   . Biotin 5 MG CAPS Take by mouth.  Marland Kitchen buPROPion (WELLBUTRIN XL) 300 MG 24 hr tablet TAKE 1 TABLET BY MOUTH EVERY DAY  . Cholecalciferol (VITAMIN D) 2000 UNITS CAPS Take 2,000 Int'l Units by mouth.  . Coenzyme Q10-Fish Oil-Vit E (CO-Q 10 OMEGA-3 FISH OIL PO) Take 100 mg elemental calcium/kg/hr by mouth daily.  Marland Kitchen diltiazem (CARDIZEM CD) 180 MG 24 hr capsule Take 1 capsule (180 mg total) by mouth 2 (two) times daily.  . DULoxetine (CYMBALTA) 60 MG capsule TAKE ONE CAPSULE EVERY DAY  . fluticasone (FLONASE) 50 MCG/ACT nasal spray instill 2 sprays into each nostril once daily (Patient taking differently: instill 2 sprays into each nostril once daily as needed)  . furosemide (LASIX) 20 MG tablet TAKE 1 TABLET (20 MG TOTAL) BY MOUTH DAILY. PLEASE KEEP UPCOMING APPOINTMENT FOR FURTHER REFILLS  . HYDROcodone-Acetaminophen (LORTAB) 10-300 MG/15ML SOLN Take by mouth.  Marland Kitchen LIVALO 2 MG TABS TAKE 1 TABLET EVERY DAY  . loratadine (CLARITIN) 10 MG tablet Take 10 mg by mouth daily.   Marland Kitchen losartan (COZAAR) 25 MG tablet TAKE 1 TABLET BY MOUTH DAILY  . Lysine 500 MG TABS Take 500 mg by mouth daily.  .  metoprolol succinate (TOPROL-XL) 25 MG 24 hr tablet TAKE 1 TABLET EVERY DAY  . nitroGLYCERIN (NITROSTAT) 0.4 MG SL tablet place 1 tablet under the tongue every 5 minutes for UP TO 3 doses if needed  . valACYclovir (VALTREX) 1000 MG tablet Take 2 tablets at onset of cold sores and repeat 2 in 12 hours.  Marland Kitchen warfarin (COUMADIN) 5 MG tablet TAKE 1/2 TO 1 TABLET DAILY AS DIRECTED BY COUMADIN CLINIC.     Allergies:   Chlorhexidine base and Penicillins   Social History   Socioeconomic History  . Marital status: Married    Spouse name: Not on file  . Number of children: Not on file  . Years of education: Not on file  . Highest education level: Not on file  Occupational History  . Not on file  Social Needs  . Financial resource strain: Not on file  . Food insecurity:    Worry: Not on file  Inability: Not on file  . Transportation needs:    Medical: Not on file    Non-medical: Not on file  Tobacco Use  . Smoking status: Former Smoker    Packs/day: 2.00    Years: 52.00    Pack years: 104.00    Types: Cigarettes    Last attempt to quit: 03/24/2008    Years since quitting: 9.8  . Smokeless tobacco: Never Used  Substance and Sexual Activity  . Alcohol use: Yes    Comment: RARE  . Drug use: No  . Sexual activity: Not on file  Lifestyle  . Physical activity:    Days per week: Not on file    Minutes per session: Not on file  . Stress: Not on file  Relationships  . Social connections:    Talks on phone: Not on file    Gets together: Not on file    Attends religious service: Not on file    Active member of club or organization: Not on file    Attends meetings of clubs or organizations: Not on file    Relationship status: Not on file  Other Topics Concern  . Not on file  Social History Narrative  . Not on file     Family History: The patient's family history includes Arthritis in her mother; Diabetes in her sister; Heart attack in her mother and sister; Heart disease in her  mother; Hypertension in her sister; Stroke in her sister.  ROS:   Please see the history of present illness.    ROS  All other systems reviewed and negative.   EKGs/Labs/Other Studies Reviewed:    The following studies were reviewed today: none  EKG:  EKG is  ordered today.  The ekg ordered today demonstrates atrial fibrillation with low ventricular response of 53 bpm with no ST-T wave at normalities.  Recent Labs: No results found for requested labs within last 8760 hours.   Recent Lipid Panel    Component Value Date/Time   CHOL 128 01/21/2017 1003   TRIG 92 01/21/2017 1003   HDL 55 01/21/2017 1003   CHOLHDL 2.3 01/21/2017 1003   CHOLHDL 2.2 09/13/2015 1013   VLDL 17 09/13/2015 1013   LDLCALC 55 01/21/2017 1003    Physical Exam:    VS:  BP 96/62   Pulse (!) 53   Ht 5\' 6"  (1.676 m)   Wt 229 lb 12.8 oz (104.2 kg)   SpO2 96%   BMI 37.09 kg/m     Wt Readings from Last 3 Encounters:  01/25/18 229 lb 12.8 oz (104.2 kg)  12/24/17 228 lb 1.6 oz (103.5 kg)  06/11/17 227 lb (103 kg)     GEN:  Well nourished, well developed in no acute distress HEENT: Normal NECK: No JVD; No carotid bruits LYMPHATICS: No lymphadenopathy CARDIAC: RRR, no murmurs, rubs, gallops RESPIRATORY:  Clear to auscultation without rales, wheezing or rhonchi  ABDOMEN: Soft, non-tender, non-distended MUSCULOSKELETAL:  No edema; No deformity  SKIN: Warm and dry NEUROLOGIC:  Alert and oriented x 3 PSYCHIATRIC:  Normal affect   ASSESSMENT:    1. Coronary artery calcification seen on CAT scan   2. Dilated cardiomyopathy (HCC)   3. Permanent atrial fibrillation   4. Mitral valve stenosis, unspecified etiology   5. Essential hypertension   6. Chronic diastolic CHF (congestive heart failure) ([place])   7. Pure hypercholesterolemia    PLAN:    In order of problems listed above:  1.  Coronary artery calcification seen  on CAT scan- she denies any anginal symptoms.  Continue on beta-blocker and  statin therapy.  She is not on aspirin due to warfarin.  2.  Dilated cardiomyopathy -recent echo showed EF improved to 50 to 55%.  3.  Permanent atrial fibrillation -well rate controlled on exam today.  She will continue on Toprol-XL 25 mg daily and warfarin followed in our Coumadin clinic.  Decrease Cardizem CD to 180mg  daily due to bradycardia and soft BP.  4.  Mild aortic stenosis on recent echo -mean gradient 11 mmHg.  5.  Hypertension - BP is well controlled on exam today on the soft side.  She will continue on Toprol-XL 25 mg daily and Losartan 25mg  daily.  Since her BP is soft and HR is slow I will decrease Cardizem CD to 180mg  daily.    6.  Chronic diastolic CHF - appears euvolemic on exam today.  Weight is stable.  She will continue on Lasix 20 mg daily.  Creatinine was 1.6 on 09/15/2017.  We will repeat bmet today.  7.  Hyperlipidemia -LDL goal is less than 70.  Her last LDL was 55 a year ago.  I will repeat an FLP and ALT.  She will continue on Livalo 2 mg daily.   Medication Adjustments/Labs and Tests Ordered: Current medicines are reviewed at length with the patient today.  Concerns regarding medicines are outlined above.  Orders Placed This Encounter  Procedures  . EKG 12-Lead   No orders of the defined types were placed in this encounter.   Signed, Fransico Him, MD  01/25/2018 3:10 PM    [place]

## 2018-01-25 NOTE — Addendum Note (Signed)
Addended by: Sarina Ill on: 01/25/2018 03:34 PM   Modules accepted: Orders

## 2018-01-26 DIAGNOSIS — H25812 Combined forms of age-related cataract, left eye: Secondary | ICD-10-CM | POA: Diagnosis not present

## 2018-01-26 DIAGNOSIS — H2512 Age-related nuclear cataract, left eye: Secondary | ICD-10-CM | POA: Diagnosis not present

## 2018-02-01 ENCOUNTER — Other Ambulatory Visit: Payer: Medicare Other

## 2018-02-01 DIAGNOSIS — E7800 Pure hypercholesterolemia, unspecified: Secondary | ICD-10-CM | POA: Diagnosis not present

## 2018-02-01 DIAGNOSIS — I4821 Permanent atrial fibrillation: Secondary | ICD-10-CM | POA: Diagnosis not present

## 2018-02-02 ENCOUNTER — Telehealth: Payer: Self-pay

## 2018-02-02 LAB — HEPATIC FUNCTION PANEL
ALBUMIN: 3.9 g/dL (ref 3.5–4.8)
ALK PHOS: 80 IU/L (ref 39–117)
ALT: 9 IU/L (ref 0–32)
AST: 12 IU/L (ref 0–40)
BILIRUBIN TOTAL: 0.3 mg/dL (ref 0.0–1.2)
Bilirubin, Direct: 0.1 mg/dL (ref 0.00–0.40)
Total Protein: 6.4 g/dL (ref 6.0–8.5)

## 2018-02-02 LAB — LIPID PANEL
Chol/HDL Ratio: 2.5 ratio (ref 0.0–4.4)
Cholesterol, Total: 142 mg/dL (ref 100–199)
HDL: 56 mg/dL (ref >39)
LDL Calculated: 69 mg/dL (ref 0–99)
Triglycerides: 86 mg/dL (ref 0–149)
VLDL Cholesterol Cal: 17 mg/dL (ref 5–40)

## 2018-02-02 LAB — BASIC METABOLIC PANEL
BUN/Creatinine Ratio: 19 (ref 12–28)
BUN: 22 mg/dL (ref 8–27)
CALCIUM: 9.1 mg/dL (ref 8.7–10.3)
CO2: 23 mmol/L (ref 20–29)
CREATININE: 1.18 mg/dL — AB (ref 0.57–1.00)
Chloride: 105 mmol/L (ref 96–106)
GFR calc Af Amer: 53 mL/min/{1.73_m2} — ABNORMAL LOW (ref >59)
GFR, EST NON AFRICAN AMERICAN: 46 mL/min/{1.73_m2} — AB (ref >59)
Glucose: 138 mg/dL — ABNORMAL HIGH (ref 65–99)
Potassium: 5.2 mmol/L (ref 3.5–5.2)
SODIUM: 142 mmol/L (ref 134–144)

## 2018-02-02 NOTE — Telephone Encounter (Signed)
-----   Message from Sueanne Margarita, MD sent at 02/02/2018  9:14 AM EST ----- Stable labs - continue current meds and forward to PCP

## 2018-02-02 NOTE — Telephone Encounter (Signed)
LMTCB

## 2018-02-03 NOTE — Telephone Encounter (Signed)
Left message to go over results.

## 2018-02-15 ENCOUNTER — Other Ambulatory Visit: Payer: Self-pay | Admitting: Family Medicine

## 2018-02-25 ENCOUNTER — Other Ambulatory Visit: Payer: Self-pay | Admitting: Cardiology

## 2018-02-28 ENCOUNTER — Ambulatory Visit (INDEPENDENT_AMBULATORY_CARE_PROVIDER_SITE_OTHER): Payer: Medicare Other | Admitting: Family

## 2018-02-28 ENCOUNTER — Encounter: Payer: Self-pay | Admitting: Family

## 2018-02-28 ENCOUNTER — Ambulatory Visit (INDEPENDENT_AMBULATORY_CARE_PROVIDER_SITE_OTHER)
Admission: RE | Admit: 2018-02-28 | Discharge: 2018-02-28 | Disposition: A | Discharge status: Home / Self Care | Payer: Medicare Other | Source: Ambulatory Visit | Attending: Family | Admitting: Family

## 2018-02-28 VITALS — BP 126/78 | HR 80 | Temp 98.0°F | Ht 66.0 in | Wt 228.0 lb

## 2018-02-28 DIAGNOSIS — R05 Cough: Secondary | ICD-10-CM

## 2018-02-28 DIAGNOSIS — J181 Lobar pneumonia, unspecified organism: Secondary | ICD-10-CM

## 2018-02-28 DIAGNOSIS — R059 Cough, unspecified: Secondary | ICD-10-CM

## 2018-02-28 DIAGNOSIS — J189 Pneumonia, unspecified organism: Secondary | ICD-10-CM

## 2018-02-28 MED ORDER — PREDNISONE 20 MG PO TABS: 20.0000 mg | ORAL_TABLET | Freq: Every day | ORAL | 0 refills | Status: DC

## 2018-02-28 MED ORDER — DOXYCYCLINE HYCLATE 100 MG PO TABS: 100.0000 mg | ORAL_TABLET | Freq: Two times a day (BID) | ORAL | 0 refills | Status: DC

## 2018-02-28 NOTE — Progress Notes (Signed)
[name] is a 72 y.o. female with the following history as recorded in EpicCare:  Patient Active Problem List   Diagnosis Date Noted  . Chronic low back pain 01/16/2017  . Encounter for therapeutic drug monitoring 12/11/2016  . Acute encephalopathy 02/03/2015  . Hematoma of right parietal scalp 02/03/2015  . Anticoagulated on Coumadin 02/03/2015  . Anemia 02/03/2015  . Status post reverse total shoulder replacement 01/31/2015  . Chronic diastolic CHF (congestive heart failure) ([place]) 06/10/2014  . Anxiety state 04/23/2014  . Permanent atrial fibrillation 11/13/2013  . Mitral stenosis 11/13/2013  . Obesity (BMI 30-39.9) 11/30/2012  . Essential hypertension 11/03/2012  . Cardiomyopathy ([place]) 09/16/2011  . DVT of lower extremity (deep venous thrombosis) ([place]) 01/22/2011  . Osteoarthritis 04/07/2010  . ALLERGIC RHINITIS 08/01/2009  . Coronary artery calcification seen on CAT scan 05/30/2009  . CARDIOVASCULAR FUNCTION STUDY, ABNORMAL 05/01/2009  . HLD (hyperlipidemia) 02/27/2009  . DYSPNEA 02/27/2009  . ABDOMINAL PAIN RIGHT UPPER QUADRANT 11/08/2008  . NHL (non-Hodgkin's lymphoma) ([place]) 11/08/2008  . DEPRESSION 06/13/2008  . FATIGUE 05/30/2008    Current Outpatient Medications  Medication Sig Dispense Refill  . ALPRAZolam (XANAX) 1 MG tablet TAKE 1 TABLET BY MOUTH THREE TIMES A DAY AS NEEDED 90 tablet 2  . b complex vitamins capsule Take 2 capsules by mouth daily.     . Biotin 5 MG CAPS Take by mouth.    Marland Kitchen buPROPion (WELLBUTRIN XL) 300 MG 24 hr tablet TAKE 1 TABLET BY MOUTH EVERY DAY 90 tablet 0  . Cholecalciferol (VITAMIN D) 2000 UNITS CAPS Take 2,000 Int'l Units by mouth.    . Coenzyme Q10-Fish Oil-Vit E (CO-Q 10 OMEGA-3 FISH OIL PO) Take 100 mg elemental calcium/kg/hr by mouth daily.    Marland Kitchen diltiazem (CARDIZEM CD) 180 MG 24 hr capsule Take 1 capsule (180 mg total) by mouth daily. 90 capsule 3  . DULoxetine (CYMBALTA) 60 MG capsule TAKE 1 CAPSULE BY MOUTH EVERY DAY 90  capsule 0  . fluticasone (FLONASE) 50 MCG/ACT nasal spray instill 2 sprays into each nostril once daily (Patient taking differently: instill 2 sprays into each nostril once daily as needed) 16 g 6  . furosemide (LASIX) 20 MG tablet TAKE 1 TABLET (20 MG TOTAL) BY MOUTH DAILY. PLEASE KEEP UPCOMING APPOINTMENT FOR FURTHER REFILLS 90 tablet 0  . HYDROcodone-Acetaminophen (LORTAB) 10-300 MG/15ML SOLN Take by mouth.    Marland Kitchen LIVALO 2 MG TABS TAKE 1 TABLET BY MOUTH EVERY DAY 90 tablet 3  . loratadine (CLARITIN) 10 MG tablet Take 10 mg by mouth daily.     Marland Kitchen losartan (COZAAR) 25 MG tablet TAKE 1 TABLET BY MOUTH DAILY 90 tablet 3  . Lysine 500 MG TABS Take 500 mg by mouth daily.    . metoprolol succinate (TOPROL-XL) 25 MG 24 hr tablet TAKE 1 TABLET EVERY DAY 90 tablet 0  . nitroGLYCERIN (NITROSTAT) 0.4 MG SL tablet place 1 tablet under the tongue every 5 minutes for UP TO 3 doses if needed 25 tablet 0  . valACYclovir (VALTREX) 1000 MG tablet Take 2 tablets at onset of cold sores and repeat 2 in 12 hours. 30 tablet 1  . warfarin (COUMADIN) 5 MG tablet TAKE 1/2 TO 1 TABLET DAILY AS DIRECTED BY COUMADIN CLINIC. 30 tablet 1  . doxycycline (VIBRA-TABS) 100 MG tablet Take 1 tablet (100 mg total) by mouth 2 (two) times daily. 20 tablet 0  . predniSONE (DELTASONE) 20 MG tablet Take 1 tablet (20 mg total) by mouth daily with  breakfast. 5 tablet 0   No current facility-administered medications for this visit.     Allergies: Chlorhexidine base and Penicillins  Past Medical History:  Diagnosis Date  . ABDOMINAL PAIN RIGHT UPPER QUADRANT 11/08/2008  . ALLERGIC RHINITIS 08/01/2009  . Anxiety   . Coronary artery calcification seen on CAT scan 05/30/2009   moderate risk coronary calcium score by chest CT  . DDD (degenerative disc disease), lumbar   . DEPRESSION 06/13/2008  . DYSLIPIDEMIA 02/27/2009  . DYSPNEA 02/27/2009  . Frequency of urination   . GERD (gastroesophageal reflux disease)   . History of Bell's palsy   .  History of DVT of lower extremity 2006-- CHRONIC COUMADIN THERAPY  . History of hiatal hernia   . History of Lyme disease   . 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 NON-HODGKIN'S LYMPHOMA, HX OF dx  2005--  chemoradiation completed 2006--  no recurrence   onocologist- dr odogwu--   . OSTEOARTHRITIS, MODERATE 04/07/2010  . Permanent atrial fibrillation    On chronic anti-coagulation with warfarin  . Retroperitoneal fibrosis     Past Surgical History:  Procedure Laterality Date  . BREAST MASS EXCISION     BENIGN-- RIGHT BREAST  . CARDIOVASCULAR STRESS TEST  04-04-2009-- PER PT ASYMPTOMATIC-- MEDICAL MANAGEMENT   STUDY WAS EQUIVOCAL/ EF 49%/ GLOBAL HYPOKINESIS/ APPEARS TO BE A MILD ANTERIOR PERFUSION DEFECT COULD REPRESENT ISCHEMIA BUT DUE TO  ACTIVITY WORSE IN STRESS THAN AT REST POSS. THE DEFECT WAS ARTIFACTUAL  . CARDIOVERSION N/A 07/11/2015   Procedure: CARDIOVERSION;  Surgeon: Larey Dresser, MD;  Location: [facility];  Service: Cardiovascular;  Laterality: N/A;  . CT ANGIOGRAM  FEB 2011   CALCIUM SCORE 161/ POSS. MODERATE MID LAD STENOSIS  . CYSTOSCOPY W/ RETROGRADES  04/28/2011   Procedure: CYSTOSCOPY WITH RETROGRADE PYELOGRAM;  Surgeon: Fredricka Bonine, MD;  Location: [facility];  Service: Urology;  Laterality: Left;  . CYSTOSCOPY W/ URETERAL STENT REMOVAL  04/28/2011   Procedure: CYSTOSCOPY WITH STENT REMOVAL;  Surgeon: Fredricka Bonine, MD;  Location: [facility];  Service: Urology;;  . EXPLORATORY LAPAROTOMY  2005   LYMPHOMA  . KNEE ARTHROSCOPY  2005   RIGHT  . MULTIPLE CYSTO/ LEFT URETERAL STENT EXCHANGES  LAST ONE 10-28-10  . RETROPERITONEAL BX  2007  . REVERSE SHOULDER ARTHROPLASTY Right 01/31/2015   Procedure: RIGHT REVERSE SHOULDER ARTHROPLASTY;  Surgeon: Justice Britain, MD;  Location: [facility];  Service: Orthopedics;  Laterality: Right;  . TONSILLECTOMY  CHILD  . TOTAL KNEE ARTHROPLASTY  OCT 2010    RIGHT  . TOTAL KNEE ARTHROPLASTY  JAN 2010   LEFT  . TRANSTHORACIC ECHOCARDIOGRAM  [id number]   MILD SYSTOLIC DYSFUNCTION/ EF 12-75%/ MODERATE DIASTOLIC DYSFUCTION/ MILD MR/ MILD BIATRIAL ENLARGEMENT    Family History  Problem Relation Age of Onset  . Arthritis Mother   . Heart disease Mother   . Heart attack Mother   . Diabetes Sister   . Hypertension Sister   . Stroke Sister   . Heart attack Sister     Social History   Tobacco Use  . Smoking status: Former Smoker    Packs/day: 2.00    Years: 52.00    Pack years: 104.00    Types: Cigarettes    Last attempt to quit: 03/24/2008    Years since quitting: 9.9  . Smokeless tobacco: Never Used  Substance Use Topics  . Alcohol use: Yes    Comment: RARE  Subjective:  Patient presents with concerns for acute bronchitis; symptoms x 1-2 weeks; + wheezing; + productive cough; history of pneumonia; former smoker- quit in 2010; not taking any OTC medications for symptom relief.     Objective:  Vitals:   02/28/18 1426  BP: 126/78  Pulse: 80  Temp: 98 F (36.7 C)  TempSrc: Oral  SpO2: 97%  Weight: 228 lb 0.2 oz (103.4 kg)  Height: 5\' 6"  (1.676 m)    General: Well developed, well nourished, in no acute distress  Skin : Warm and dry.  Head: Normocephalic and atraumatic  Eyes: Sclera and conjunctiva clear; pupils round and reactive to light; extraocular movements intact  Ears: External normal; canals clear; tympanic membranes normal  Oropharynx: Pink, supple. No suspicious lesions  Neck: Supple without thyromegaly, adenopathy  Lungs: Respirations unlabored; diminished lung sounds in right and left lower lobes CVS exam: normal rate and regular rhythm.  Neurologic: Alert and oriented; speech intact; face symmetrical; moves all extremities well; CNII-XII intact without focal deficit   Assessment:  1. Cough   2. Pneumonia of lower lobe due to infectious organism, unspecified laterality ([place])     Plan:  Update CXR today; Rx  for Doxycycline 100 mg bid x 10 days; Rx for Prednisone 20 mg qd x 5 days; follow-up to be determined based on CXR results. She is scheduled to have her INR checked tomorrow and will make sure Coumadin clinic knows she is on Doxycycline.  No follow-ups on file.  Orders Placed This Encounter  Procedures  . DG Chest 2 View    Standing Status:   Future    Standing Expiration Date:   05/02/2019    Order Specific Question:   Reason for Exam (SYMPTOM  OR DIAGNOSIS REQUIRED)    Answer:   cough    Order Specific Question:   Preferred imaging location?    Answer:   [facility]    Order Specific Question:   Radiology Contrast Protocol - do NOT remove file path    Answer:   [email or web address]    Requested Prescriptions   Signed Prescriptions Disp Refills  . doxycycline (VIBRA-TABS) 100 MG tablet 20 tablet 0    Sig: Take 1 tablet (100 mg total) by mouth 2 (two) times daily.  . predniSONE (DELTASONE) 20 MG tablet 5 tablet 0    Sig: Take 1 tablet (20 mg total) by mouth daily with breakfast.

## 2018-03-01 ENCOUNTER — Ambulatory Visit: Payer: Medicare Other | Admitting: Cardiology

## 2018-03-05 ENCOUNTER — Other Ambulatory Visit: Payer: Self-pay | Admitting: Cardiology

## 2018-03-07 ENCOUNTER — Telehealth: Payer: Self-pay | Admitting: Family

## 2018-03-07 MED ORDER — IPRATROPIUM BROMIDE 0.03 % NA SOLN: 2.0000 | Freq: Two times a day (BID) | NASAL | 0 refills | Status: DC

## 2018-03-07 NOTE — Telephone Encounter (Signed)
Please make sure she understands that the CXR did NOT show pneumonia so I don't think a follow-up CXR is needed. I can call in a nasal spray to help with the runny nose. If her symptoms are persisting, she needs to see her PCP in follow-up.

## 2018-03-07 NOTE — Telephone Encounter (Signed)
Copied from [place] [id number]. Topic: Quick Sport and exercise psychologist Patient (Clinic Use ONLY) >> Mar 01, 2018  3:28 PM Marcina Millard, CMA wrote: Reason for CRM: Called and left message for patient to return call to clinic. If she calls back please let her know that her CXR did not show pneumonia. We also wanted to follow up to see how she was doing as well. >> Mar 07, 2018  2:03 PM Morey Hummingbird wrote: Spoke to patient and informed of results, patient has a very runny nose she is constantly, positive for cough but not very bad, the nose is worse than the cough, negative for chest tightness, she does have an appetite and she is drinking fluids, she is tire and sleeping a lot, she has finished her prednisone and is still taking the doxy. She would like to know if she will needs another chest xray in soon to make sure the pneumonia has cleared up, please advise

## 2018-03-07 NOTE — Telephone Encounter (Signed)
Please see note below; accidentally closed it.

## 2018-03-08 NOTE — Telephone Encounter (Signed)
Called and left message for patient with info. Also created CRM incase she returns call to clinic.

## 2018-03-09 ENCOUNTER — Other Ambulatory Visit: Payer: Medicare Other

## 2018-03-24 ENCOUNTER — Telehealth (INDEPENDENT_AMBULATORY_CARE_PROVIDER_SITE_OTHER): Payer: Self-pay

## 2018-03-25 ENCOUNTER — Ambulatory Visit (INDEPENDENT_AMBULATORY_CARE_PROVIDER_SITE_OTHER): Payer: Medicare Other | Admitting: Ophthalmology

## 2018-03-25 ENCOUNTER — Other Ambulatory Visit: Payer: Self-pay | Admitting: Cardiology

## 2018-03-25 ENCOUNTER — Encounter (INDEPENDENT_AMBULATORY_CARE_PROVIDER_SITE_OTHER): Payer: Self-pay | Admitting: Ophthalmology

## 2018-03-25 DIAGNOSIS — H3581 Retinal edema: Secondary | ICD-10-CM | POA: Diagnosis not present

## 2018-03-25 DIAGNOSIS — H353132 Nonexudative age-related macular degeneration, bilateral, intermediate dry stage: Secondary | ICD-10-CM | POA: Diagnosis not present

## 2018-03-25 DIAGNOSIS — H25813 Combined forms of age-related cataract, bilateral: Secondary | ICD-10-CM | POA: Diagnosis not present

## 2018-03-25 DIAGNOSIS — H3321 Serous retinal detachment, right eye: Secondary | ICD-10-CM

## 2018-03-25 NOTE — Progress Notes (Addendum)
[facility] Note  03/25/2018     CHIEF COMPLAINT Patient presents for Retina Follow Up   HISTORY OF PRESENT ILLNESS: [name] is a 73 y.o. female who presents to the clinic today for:   HPI    Retina Follow Up    Patient presents with  Retinal Break/Detachment.  In right eye.  This started 9 months ago.  Severity is mild.  Since onset it is gradually worsening.  I, the attending physician,  performed the HPI with the patient and updated documentation appropriately.          Comments    Patient states yesterday she noticed when she closed her left eye she can see out of her OD to the right but nothing to the left.(like a pulled back curtain). Today "darkiness" is better. Patient reports she had cataract sx 01/2018 OU,       Last edited by Bernarda Caffey, MD on 03/25/2018  2:13 PM. (History)    pt states she noticed her vision was getting worse yesterday, she states she is missing parts of her vision in her left eye, she states she never noticed any flashes of light, pt states she had a bad experience with anesthesia after shoulder sx so she is afraid to be put to sleep again, pt states Dr. Kathlen Mody did her cataract sx  Referring physician: [name], MD [place], [place] [id number]  HISTORICAL INFORMATION:   Selected notes from the [place] Referred by Dr. Delman Cheadle for concern of mac off RD LEE- 02.04.19  Ocular Hx-  PMH- A-Fib on coumadin, severe anxiety, CAD, depression    CURRENT MEDICATIONS: No current outpatient medications on file. (Ophthalmic Drugs)   No current facility-administered medications for this visit.  (Ophthalmic Drugs)   Current Outpatient Medications (Other)  Medication Sig  . ALPRAZolam (XANAX) 1 MG tablet TAKE 1 TABLET BY MOUTH THREE TIMES A DAY AS NEEDED  . b complex vitamins capsule Take 2 capsules by mouth daily.   . Biotin 5 MG CAPS Take by mouth.  Marland Kitchen buPROPion (WELLBUTRIN XL)  300 MG 24 hr tablet TAKE 1 TABLET BY MOUTH EVERY DAY  . Cholecalciferol (VITAMIN D) 2000 UNITS CAPS Take 2,000 Int'l Units by mouth.  . Coenzyme Q10-Fish Oil-Vit E (CO-Q 10 OMEGA-3 FISH OIL PO) Take 100 mg elemental calcium/kg/hr by mouth daily.  Marland Kitchen diltiazem (CARDIZEM CD) 180 MG 24 hr capsule Take 1 capsule (180 mg total) by mouth daily.  Marland Kitchen doxycycline (VIBRA-TABS) 100 MG tablet Take 1 tablet (100 mg total) by mouth 2 (two) times daily.  . DULoxetine (CYMBALTA) 60 MG capsule TAKE 1 CAPSULE BY MOUTH EVERY DAY  . fluticasone (FLONASE) 50 MCG/ACT nasal spray instill 2 sprays into each nostril once daily (Patient taking differently: instill 2 sprays into each nostril once daily as needed)  . furosemide (LASIX) 20 MG tablet TAKE 1 TABLET (20 MG TOTAL) BY MOUTH DAILY. PLEASE KEEP UPCOMING APPOINTMENT FOR FURTHER REFILLS  . HYDROcodone-Acetaminophen (LORTAB) 10-300 MG/15ML SOLN Take by mouth.  Marland Kitchen ipratropium (ATROVENT) 0.03 % nasal spray Place 2 sprays into both nostrils every 12 (twelve) hours.  Marland Kitchen LIVALO 2 MG TABS TAKE 1 TABLET BY MOUTH EVERY DAY  . loratadine (CLARITIN) 10 MG tablet Take 10 mg by mouth daily.   Marland Kitchen losartan (COZAAR) 25 MG tablet TAKE 1 TABLET EVERY DAY  . Lysine 500 MG TABS Take 500 mg by mouth daily.  . metoprolol succinate (TOPROL-XL)  25 MG 24 hr tablet TAKE 1 TABLET EVERY DAY  . nitroGLYCERIN (NITROSTAT) 0.4 MG SL tablet place 1 tablet under the tongue every 5 minutes for UP TO 3 doses if needed  . predniSONE (DELTASONE) 20 MG tablet Take 1 tablet (20 mg total) by mouth daily with breakfast.  . valACYclovir (VALTREX) 1000 MG tablet Take 2 tablets at onset of cold sores and repeat 2 in 12 hours.  Marland Kitchen warfarin (COUMADIN) 5 MG tablet TAKE 1/2 TO 1 TABLET DAILY AS DIRECTED BY COUMADIN CLINIC.   No current facility-administered medications for this visit.  (Other)      REVIEW OF SYSTEMS: ROS    Positive for: Eyes   Negative for: Constitutional, Gastrointestinal, Neurological, Skin,  Genitourinary, Musculoskeletal, HENT, Endocrine, Cardiovascular, Respiratory, Psychiatric, Allergic/Imm, Heme/Lymph   Last edited by Zenovia Jordan, LPN on 0/08/2692  8:54 PM. (History)       ALLERGIES Allergies  Allergen Reactions  . Chlorhexidine Base Itching    CHG WIPES  . Penicillins Hives and Rash    PAST MEDICAL HISTORY Past Medical History:  Diagnosis Date  . ABDOMINAL PAIN RIGHT UPPER QUADRANT 11/08/2008  . ALLERGIC RHINITIS 08/01/2009  . Anxiety   . Coronary artery calcification seen on CAT scan 05/30/2009   moderate risk coronary calcium score by chest CT  . DDD (degenerative disc disease), lumbar   . DEPRESSION 06/13/2008  . DYSLIPIDEMIA 02/27/2009  . DYSPNEA 02/27/2009  . Frequency of urination   . GERD (gastroesophageal reflux disease)   . History of Bell's palsy   . History of DVT of lower extremity 2006-- CHRONIC COUMADIN THERAPY  . History of hiatal hernia   . History of Lyme disease   . 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 NON-HODGKIN'S LYMPHOMA, HX OF dx  2005--  chemoradiation completed 2006--  no recurrence   onocologist- dr odogwu--   . OSTEOARTHRITIS, MODERATE 04/07/2010  . Permanent atrial fibrillation    On chronic anti-coagulation with warfarin  . Retroperitoneal fibrosis    Past Surgical History:  Procedure Laterality Date  . BREAST MASS EXCISION     BENIGN-- RIGHT BREAST  . CARDIOVASCULAR STRESS TEST  04-04-2009-- PER PT ASYMPTOMATIC-- MEDICAL MANAGEMENT   STUDY WAS EQUIVOCAL/ EF 49%/ GLOBAL HYPOKINESIS/ APPEARS TO BE A MILD ANTERIOR PERFUSION DEFECT COULD REPRESENT ISCHEMIA BUT DUE TO  ACTIVITY WORSE IN STRESS THAN AT REST POSS. THE DEFECT WAS ARTIFACTUAL  . CARDIOVERSION N/A 07/11/2015   Procedure: CARDIOVERSION;  Surgeon: Larey Dresser, MD;  Location: [place];  Service: Cardiovascular;  Laterality: N/A;  . CT ANGIOGRAM  FEB 2011   CALCIUM SCORE 161/ POSS. MODERATE MID LAD STENOSIS  . CYSTOSCOPY W/  RETROGRADES  04/28/2011   Procedure: CYSTOSCOPY WITH RETROGRADE PYELOGRAM;  Surgeon: Fredricka Bonine, MD;  Location[facility];  Service: Urology;  Laterality: Left;  . CYSTOSCOPY W/ URETERAL STENT REMOVAL  04/28/2011   Procedure: CYSTOSCOPY WITH STENT REMOVAL;  Surgeon: Fredricka Bonine, MD;  Location: [facility];  Service: Urology;;  . EXPLORATORY LAPAROTOMY  2005   LYMPHOMA  . KNEE ARTHROSCOPY  2005   RIGHT  . MULTIPLE CYSTO/ LEFT URETERAL STENT EXCHANGES  LAST ONE 10-28-10  . RETROPERITONEAL BX  2007  . REVERSE SHOULDER ARTHROPLASTY Right 01/31/2015   Procedure: RIGHT REVERSE SHOULDER ARTHROPLASTY;  Surgeon: Justice Britain, MD;  Location: [place];  Service: Orthopedics;  Laterality: Right;  . TONSILLECTOMY  CHILD  . TOTAL KNEE ARTHROPLASTY  OCT 2010  RIGHT  . TOTAL KNEE ARTHROPLASTY  JAN 2010   LEFT  . TRANSTHORACIC ECHOCARDIOGRAM  [id number]   MILD SYSTOLIC DYSFUNCTION/ EF 85-02%/ MODERATE DIASTOLIC DYSFUCTION/ MILD MR/ MILD BIATRIAL ENLARGEMENT    FAMILY HISTORY Family History  Problem Relation Age of Onset  . Arthritis Mother   . Heart disease Mother   . Heart attack Mother   . Diabetes Sister   . Hypertension Sister   . Stroke Sister   . Heart attack Sister     SOCIAL HISTORY Social History   Tobacco Use  . Smoking status: Former Smoker    Packs/day: 2.00    Years: 52.00    Pack years: 104.00    Types: Cigarettes    Last attempt to quit: 03/24/2008    Years since quitting: 10.0  . Smokeless tobacco: Never Used  Substance Use Topics  . Alcohol use: Yes    Comment: RARE  . Drug use: No         OPHTHALMIC EXAM:  Base Eye Exam    Visual Acuity (Snellen - Linear)      Right Left   Dist [place] 20/50 +2 20/30   Dist ph [place] NI NI       Tonometry (Tonopen, 1:52 PM)      Right Left   Pressure 18 15       Pupils      Dark Light Shape React APD   Right 3 2 Round Slow None   Left 3 2 Round Brisk None       Visual  Fields (Counting fingers)      Left Right    Full    Restrictions  Partial outer superior temporal, inferior temporal deficiencies       Extraocular Movement      Right Left    Full, Ortho Full, Ortho       Neuro/Psych    Oriented x3:  Yes   Mood/Affect:  Normal       Dilation    Both eyes:  1.0% Mydriacyl, 2.5% Phenylephrine @ 1:50 PM        Slit Lamp and Fundus Exam    External Exam      Right Left   External Periorbital edema        Slit Lamp Exam      Right Left   Lids/Lashes Dermatochalasis - upper lid Dermatochalasis - upper lid,  Mild Telangiectasia   Conjunctiva/Sclera White and quiet White and quiet   Cornea Arcus, 1-2+ Punctate epithelial erosions, Well healed cataract wounds Arcus, 2+ fine Punctate epithelial erosions   Anterior Chamber Deep and quiet Deep and quiet   Iris Round and dilated Round and dilated   Lens PC IOL in good position, trace Posterior capsular opacification PC IOL with PC folds   Vitreous Vitreous syneresis, +tobacco dusting Posterior vitreous detachment, Vitreous syneresis       Fundus Exam      Right Left   Disc Temporal Peripapillary atrophy, otherwise normal Tilted disc, Peripapillary atrophy   C/D Ratio 0.3 0.3   Macula Temporal SRF -- splitting fovea attached; drusen; RPE mottling and clumping, No heme or edema   Vessels Vascular attenuation Vascular attenuation   Periphery Temporal retinal detachment from 1000-0700, abutting previous cryo scar, no obvious tear; SRF extends posteriorly to fovea Attached          IMAGING AND PROCEDURES  Imaging and Procedures for 07/14/17  OCT, Retina - OU - Both Eyes  Right Eye Quality was good. Central Foveal Thickness: 327. Progression has worsened. Findings include subretinal fluid, epiretinal membrane, abnormal foveal contour, intraretinal fluid.   Left Eye Quality was good. Central Foveal Thickness: 278. Progression has been stable. Findings include normal foveal contour, no  IRF, no SRF, retinal drusen , outer retinal atrophy, subretinal hyper-reflective material, epiretinal membrane (Thin choriod).   Notes *Images captured and stored on drive  Diagnosis / Impression:  OD: fovea-splitting temporal retinal detachment; tr cystic changes within detached retina OS: Non-Exudative AMD -- stable from prior  Clinical management:  See below  Abbreviations: NFP - Normal foveal profile. CME - cystoid macular edema. PED - pigment epithelial detachment. IRF - intraretinal fluid. SRF - subretinal fluid. EZ - ellipsoid zone. ERM - epiretinal membrane. ORA - outer retinal atrophy. ORT - outer retinal tubulation. SRHM - subretinal hyper-reflective material         Color Fundus Photography Optos - OU - Both Eyes       Right Eye Progression has worsened. Disc findings include normal observations. Macula : detached. Vessels : attenuated. Periphery : detachment, RPE abnormality.   Left Eye Progression has been stable. Disc findings include normal observations. Macula : normal observations, drusen. Vessels : attenuated. Periphery : RPE abnormality.   Notes **Images stored on drive**  Impression OD: macula involving, fovea-splitting temporal retinal detachment OS: scattered peripheral RPE changes                ASSESSMENT/PLAN:    ICD-10-CM   1. Right retinal detachment H33.21 Color Fundus Photography Optos - OU - Both Eyes  2. Retinal edema H35.81 OCT, Retina - OU - Both Eyes  3. Intermediate stage nonexudative age-related macular degeneration of both eyes H35.3132   4. Combined form of age-related cataract, both eyes H25.813     1. Retinal detachment, OD-  - initial presentation: macula- and fovea-involving rhegmatogenous retinal detachment - initial OCT showed IRF/cystic changes suggestive of chronic detachment - supero-temporal detachment from 0730 to 1200 with small horseshoe tear located at 1030 and large horseshoe tear located at 1100 - s/p  pneumatic cryopexy OD (02.04.19) - was doing well post pneumatic and then was lost to f/u since 4.23.19 - today, pt presents urgently with nasal hemisphere field defect and found to have recurrent macula-involving, fovea-splitting, temporal retinal detachment - pt reports that she noticed involvement of central vision yesterday, 1.2.20 - no frank tear noted but SRF extends from old cryo scar (1000 - 0700, temporal quad) - interestingly BCVA 20/50 OD despite detachment - Potential treatment options including delimiting laser, pneumatic retinopexy, scleral buckle, and vitrectomy, cryotherapy and laser, and the use of air, gas, and oil discussed with patient.  The risks of blindness, loss of vision, infection, hemorrhage, cataract progression or lens displacement were discussed with patient. - recommend SBP + 25g PPV/EL/Gas OD under general anesthesia - pt wishes to proceed with surgery - RBA of procedure discussed, questions answered -- specifically discussed importance of compliance with post op positioning, drops, and f/u - informed consent obtained and signed - case scheduled for Thursday 03/31/18 - pt with extensive PMH and reports recent difficulty with anesthesia -- will need medical clearance from PCP - f/u POD1  2. Retinal edema  - as discussed above, detached retina had cystic changes / IRF suggestive of chronic detachment -- now improved post pneumatic cryopexy - today tr cystic changes within detached retina  3. Age related macular degeneration, non-exudative, both eyes  - The incidence, anatomy, and pathology  of dry AMD, risk of progression, and the AREDS and AREDS 2 study including smoking risks discussed with patient.  - Recommend amsler grid monitoring  - continue present management  4. Pseudophakia OU  - s/p CE/IOL OU  - beautiful surgeries, doing well -- performed by Dr. Read Drivers, November 2019  - monitor    Ophthalmic Meds Ordered this visit:  No orders of the defined  types were placed in this encounter.      Return in about 1 week (around 04/01/2018) for POV.  There are no Patient Instructions on file for this visit.   Explained the diagnoses, plan, and follow up with the patient and they expressed understanding.  Patient expressed understanding of the importance of proper follow up care.   This document serves as a record of services personally performed by Gardiner Sleeper, MD, PhD. It was created on their behalf by Ernest Mallick, OA, an ophthalmic assistant. The creation of this record is the provider's dictation and/or activities during the visit.    Electronically signed by: Ernest Mallick, OA  01.03.2020 4:25 PM    Gardiner Sleeper, M.D., Ph.D. Diseases & Surgery of the Retina and Vitreous [facility]  I have reviewed the above documentation for accuracy and completeness, and I agree with the above. Gardiner Sleeper, M.D., Ph.D. 03/27/18 4:25 PM    Abbreviations: M myopia (nearsighted); A astigmatism; H hyperopia (farsighted); P presbyopia; Mrx spectacle prescription;  CTL contact lenses; OD right eye; OS left eye; OU both eyes  XT exotropia; ET esotropia; PEK punctate epithelial keratitis; PEE punctate epithelial erosions; DES dry eye syndrome; MGD meibomian gland dysfunction; ATs artificial tears; PFAT's preservative free artificial tears; [place] nuclear sclerotic cataract; PSC posterior subcapsular cataract; ERM epi-retinal membrane; PVD posterior vitreous detachment; RD retinal detachment; DM diabetes mellitus; DR diabetic retinopathy; NPDR non-proliferative diabetic retinopathy; PDR proliferative diabetic retinopathy; CSME clinically significant macular edema; DME diabetic macular edema; dbh dot blot hemorrhages; CWS cotton wool spot; POAG primary open angle glaucoma; C/D cup-to-disc ratio; HVF humphrey visual field; GVF goldmann visual field; OCT optical coherence tomography; IOP intraocular pressure; BRVO Branch retinal vein  occlusion; CRVO central retinal vein occlusion; CRAO central retinal artery occlusion; BRAO branch retinal artery occlusion; RT retinal tear; SB scleral buckle; PPV pars plana vitrectomy; VH Vitreous hemorrhage; PRP panretinal laser photocoagulation; IVK intravitreal kenalog; VMT vitreomacular traction; MH Macular hole;  NVD neovascularization of the disc; NVE neovascularization elsewhere; AREDS age related eye disease study; ARMD age related macular degeneration; POAG primary open angle glaucoma; EBMD epithelial/anterior basement membrane dystrophy; ACIOL anterior chamber intraocular lens; IOL intraocular lens; PCIOL posterior chamber intraocular lens; Phaco/IOL phacoemulsification with intraocular lens placement; [place] photorefractive keratectomy; LASIK laser assisted in situ keratomileusis; HTN hypertension; DM diabetes mellitus; COPD chronic obstructive pulmonary disease

## 2018-03-25 NOTE — Telephone Encounter (Signed)
Pt is overdue for follow-up, last seen in [facility] 10/21/17, cancelled appt on 11/18/17 and never rescheduled.  Called spoke with pt she states she has had a lot going on and has failed to follow-up.  Advised pt it has been 4 months since her INR has last been checked and we cannot refill rx until INR is checked.  Advised of risks of being on Warfarin and not being monitored.  Pt declined to make follow-up, waiting to hear from ophthalmologist regarding possible detached retina.  Pt states she WCB to schedule appt as soon as she hears from eye doctor about appt.

## 2018-03-26 ENCOUNTER — Telehealth: Payer: Self-pay | Admitting: Cardiology

## 2018-03-26 NOTE — Telephone Encounter (Signed)
The patient called our answering service to discuss her Coumadin.  She has not been in for an INR checked since 10/21/2017.  She is scheduled to have surgery for a detached retina on Thursday and is to hold her Coumadin for 5 days prior.  I do not see a request to hold Coumadin from the ophthalmologist in our system.  The patient is on Coumadin for A. fib and does not have a mechanical heart valve.  She has not been diagnosed with stroke although she says she had old strokes show up on a scan in the past.  I informed her that we would have to go through pharmacy for arrangement of her Coumadin and I will send this note to our pharmacist.  I advised her to call our office first thing Monday morning.

## 2018-03-27 ENCOUNTER — Encounter (INDEPENDENT_AMBULATORY_CARE_PROVIDER_SITE_OTHER): Payer: Self-pay | Admitting: Ophthalmology

## 2018-03-28 ENCOUNTER — Other Ambulatory Visit: Payer: Self-pay

## 2018-03-28 ENCOUNTER — Encounter: Payer: Self-pay | Admitting: *Deleted

## 2018-03-28 ENCOUNTER — Encounter (HOSPITAL_COMMUNITY): Payer: Self-pay | Admitting: *Deleted

## 2018-03-28 NOTE — Telephone Encounter (Addendum)
Pt is 5 months overdue for an INR check, she did not show to her last scheduled appt in August 2019 and would not make an appt when she was called last week and advised of being overdue.  She takes warfarin for afib with CHADS2VASc score of 5 (age, sex, CHF, HTN, CAD), also with history of DVT in 2006. Need to clarify patient' reported history of strokes as we do not have this anywhere in our records. If she has had strokes, she will need to be bridged with Lovenox in order to hold her warfarin for 5 days.  LMOM for pt to discuss.

## 2018-03-28 NOTE — Telephone Encounter (Signed)
Patient returned call

## 2018-03-28 NOTE — Telephone Encounter (Signed)
This encounter was created in error - please disregard.

## 2018-03-28 NOTE — Telephone Encounter (Signed)
Spoke with pt - she states she does not need to hold her warfarin for her upcoming procedure.  To clarify stroke history - pt said she had a bad fall in [place] in 2017 and hit her head. She went to the hospital and states they told her she had residual of 3 strokes.  Also scheduled pt for INR check tomorrow as she is 5 months overdue.

## 2018-03-28 NOTE — H&P (Signed)
[name] is an 73 y.o. female.    Chief Complaint: nasal visual field loss, OD  HPI: 73 yo F with history of retinal detachment OD s/p pneumatic retinopexy on 2.4.19. Was lost to f/u since April 2019. Presented urgently with complaint of visual field defect OD and on dilated exam was found to have a recurrent retinal detachment OD--macula-involving, fovea-splitting, temporal retinal detachment, OD.  Past Medical History:  Diagnosis Date  . ABDOMINAL PAIN RIGHT UPPER QUADRANT 11/08/2008  . ALLERGIC RHINITIS 08/01/2009  . Anxiety   . Complication of anesthesia 01/31/2015   ? doesn't remember anything after being taken to pre surgery  . Coronary artery calcification seen on CAT scan 05/30/2009   moderate risk coronary calcium score by chest CT  . DDD (degenerative disc disease), lumbar   . DEPRESSION 06/13/2008  . DYSLIPIDEMIA 02/27/2009  . DYSPNEA 02/27/2009  . Dysrhythmia    Afib  . Frequency of urination   . GERD (gastroesophageal reflux disease)    "years ago"  . Head injury 2016   did not loose consciousness  . History of Bell's palsy   . History of DVT of lower extremity 2006-- CHRONIC COUMADIN THERAPY  . History of hiatal hernia   . History of Lyme disease   . 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 Hypertension   . NON-HODGKIN'S LYMPHOMA, HX OF dx  2005--  chemoradiation completed 2006--  no recurrence   onocologist- dr odogwu--   . OSTEOARTHRITIS, MODERATE 04/07/2010  . Permanent atrial fibrillation    On chronic anti-coagulation with warfarin  . Pneumonia    x 3 - last time 02/2018  . Retroperitoneal fibrosis   . Stroke [facility])    ? - [place]- "you have had 3 strokes"    Past Surgical History:  Procedure Laterality Date  . BREAST MASS EXCISION     BENIGN-- RIGHT BREAST  . CARDIOVASCULAR STRESS TEST  04-04-2009-- PER PT ASYMPTOMATIC-- MEDICAL MANAGEMENT   STUDY WAS EQUIVOCAL/ EF 49%/ GLOBAL HYPOKINESIS/ APPEARS TO BE A MILD  ANTERIOR PERFUSION DEFECT COULD REPRESENT ISCHEMIA BUT DUE TO  ACTIVITY WORSE IN STRESS THAN AT REST POSS. THE DEFECT WAS ARTIFACTUAL  . CARDIOVERSION N/A 07/11/2015   Procedure: CARDIOVERSION;  Surgeon: Larey Dresser, MD;  Location: [place];  Service: Cardiovascular;  Laterality: N/A;  . CT ANGIOGRAM  FEB 2011   CALCIUM SCORE 161/ POSS. MODERATE MID LAD STENOSIS  . CYSTOSCOPY W/ RETROGRADES  04/28/2011   Procedure: CYSTOSCOPY WITH RETROGRADE PYELOGRAM;  Surgeon: Fredricka Bonine, MD;  Location: [facility];  Service: Urology;  Laterality: Left;  . CYSTOSCOPY W/ URETERAL STENT REMOVAL  04/28/2011   Procedure: CYSTOSCOPY WITH STENT REMOVAL;  Surgeon: Fredricka Bonine, MD;  Location: [facility];  Service: Urology;;  . EXPLORATORY LAPAROTOMY  2005   LYMPHOMA  . EYE SURGERY Bilateral    cataract   . KNEE ARTHROSCOPY  2005   RIGHT  . MULTIPLE CYSTO/ LEFT URETERAL STENT EXCHANGES  LAST ONE 10-28-10  . RETROPERITONEAL BX  2007  . REVERSE SHOULDER ARTHROPLASTY Right 01/31/2015   Procedure: RIGHT REVERSE SHOULDER ARTHROPLASTY;  Surgeon: Justice Britain, MD;  Location: [place];  Service: Orthopedics;  Laterality: Right;  . TONSILLECTOMY  CHILD  . TOTAL KNEE ARTHROPLASTY  OCT 2010   RIGHT  . TOTAL KNEE ARTHROPLASTY  JAN 2010   LEFT  . TRANSTHORACIC ECHOCARDIOGRAM  [id number]   MILD SYSTOLIC DYSFUNCTION/ EF 63-78%/ MODERATE DIASTOLIC DYSFUCTION/ MILD MR/  MILD BIATRIAL ENLARGEMENT    Family History  Problem Relation Age of Onset  . Arthritis Mother   . Heart disease Mother   . Heart attack Mother   . Diabetes Sister   . Hypertension Sister   . Stroke Sister   . Heart attack Sister    Social History:  reports that she quit smoking about 10 years ago. Her smoking use included cigarettes. She has a 104.00 pack-year smoking history. She has never used smokeless tobacco. She reports previous alcohol use. She reports that she does not use  drugs.  Allergies:  Allergies  Allergen Reactions  . Chlorhexidine Base Itching    CHG WIPES  . Penicillins Hives and Rash    DID THE REACTION INVOLVE: Swelling of the face/tongue/throat, SOB, or low BP? No Sudden or severe rash/hives, skin peeling, or the inside of the mouth or nose? No Did it require medical treatment? No When did it last happen?90s If all above answers are "NO", may proceed with cephalosporin use.     No medications prior to admission.    Review of systems otherwise negative  Height 5\' 5"  (1.651 m), weight 104.3 kg.  Physical exam: Mental status: oriented x3. Eyes: See eye exam associated with this date of surgery Ears, Nose, Throat: within normal limits Neck: Within Normal limits General: within normal limits Chest: Within normal limits Breast: deferred Heart: Within normal limits Abdomen: Within normal limits GU: deferred Extremities: within normal limits Skin: within normal limits  Assessment/Plan 1. Macula-involving, fovea-splitting, recurrent retinal detachment, OD  Plan: To [facility] for scleral buckle procedure + 25g PPV w/ PFO/EL/Gas OD, under general anesthesia - case scheduled for 1.9.20, 1130 am, [facility] OR 08   Gardiner Sleeper, M.D., Ph.D. Vitreoretinal Surgeon [facility]

## 2018-03-28 NOTE — Progress Notes (Signed)
Mrs Hoke denies chest pain or shortness of breath.  Patient was diagnosed with pneumonia in December 2019. Ms Eshleman has a hx of Afib, CAD, cardiologist is Dr Radford Pax. Patient is on Coumadin, Dr Coralyn Pear states patient does not have to stop coumadin prior to surgery.  Ms Glauser has not been to Coumadin in several months, she is going tomorrow and having a PT/INR drawn. October 2016, Mrs Crewe had a fall in [place], this resulted in surgery 01/2015 at [facility].  Patient reports that she remembered being called to go to the pre- surgery area, but does not remember anything after that. Ms Skilton remembers waking up in a skilled nursing facility. Patient was discharged a couple days after surgery and came to ED the next day with altered mental status. "It had to be the anesthesia. I 'm asking anesthesia PA-Cs to review chart.

## 2018-03-29 ENCOUNTER — Ambulatory Visit: Payer: Medicare Other

## 2018-03-29 DIAGNOSIS — Z7901 Long term (current) use of anticoagulants: Secondary | ICD-10-CM

## 2018-03-29 DIAGNOSIS — Z5181 Encounter for therapeutic drug level monitoring: Secondary | ICD-10-CM

## 2018-03-29 DIAGNOSIS — I824Y9 Acute embolism and thrombosis of unspecified deep veins of unspecified proximal lower extremity: Secondary | ICD-10-CM

## 2018-03-29 LAB — POCT INR: INR: 3.5 — AB (ref 2.0–3.0)

## 2018-03-29 NOTE — Anesthesia Preprocedure Evaluation (Addendum)
Anesthesia Evaluation  Patient identified by MRN, date of birth, ID band Patient awake    Reviewed: Allergy & Precautions, NPO status , Patient's Chart, lab work & pertinent test results  History of Anesthesia Complications (+) history of anesthetic complications (? history of memory loss after surgery in 2016)  Airway Mallampati: II  TM Distance: >3 FB Neck ROM: Full    Dental  (+) Dental Advisory Given, Poor Dentition, Missing   Pulmonary former smoker,    breath sounds clear to auscultation       Cardiovascular hypertension, Pt. on medications and Pt. on home beta blockers + CAD, +CHF and + DVT  + dysrhythmias Atrial Fibrillation  Rhythm:Irregular Rate:Normal   '18 Nuc Stress - Nuclear stress EF: 46%. Defect in the distal anterior, mid/distal anteroseptal and apical distributions consistent with scar and /or soft tissue attenuation. No ischemia. This is an intermediate risk study. Recommend echo to define wall motion, LVEF  '18 TTE - EF 50% to 55%. Mild AS. Mild MR. LA and RA were moderately dilated.    Neuro/Psych PSYCHIATRIC DISORDERS Anxiety Depression  Bell's palsy CVA, No Residual Symptoms    GI/Hepatic Neg liver ROS, hiatal hernia, GERD  ,  Endo/Other   Obesity   Renal/GU Renal disease     Musculoskeletal  (+) Arthritis , Osteoarthritis,    Abdominal   Peds  Hematology  (+) anemia ,  Non-Hodgkin's lymphoma    Anesthesia Other Findings   Reproductive/Obstetrics                           Anesthesia Physical Anesthesia Plan  ASA: III  Anesthesia Plan: General   Post-op Pain Management:    Induction: Intravenous  PONV Risk Score and Plan: 3 and Treatment may vary due to age or medical condition, Ondansetron and Dexamethasone  Airway Management Planned: Oral ETT  Additional Equipment: None  Intra-op Plan:   Post-operative Plan: Extubation in OR  Informed  Consent: I have reviewed the patients History and Physical, chart, labs and discussed the procedure including the risks, benefits and alternatives for the proposed anesthesia with the patient or authorized representative who has indicated his/her understanding and acceptance.   Dental advisory given  Plan Discussed with: CRNA and Anesthesiologist  Anesthesia Plan Comments:       Anesthesia Quick Evaluation

## 2018-03-29 NOTE — Patient Instructions (Signed)
Please skip coumadin tonight, then resume taking same dosage 1 tablet (5mg ) every day except 1/2 tablet (2.5mg ) on Sundays and Tuesdays.  Recheck INR in 3 weeks.

## 2018-03-29 NOTE — Progress Notes (Signed)
Anesthesia Chart Review: [name]   Case:  [id number] Date/Time:  03/31/18 1115   Procedure:  VITRECTOMY 53 GAUGE WITH SCLERAL BUCKLE (Right )   Anesthesia type:  General   Pre-op diagnosis:  Retinal Detachment Right Eye   Location:  [place] [facility] ROOM 08 / [facility]   Surgeon:  Bernarda Caffey, MD      DISCUSSION: 73 yo female former smoker. Pertinent hx includes NH Lymphoma (s/p chemoradiation 2006 with no recurrence), Dilated cardiomyopathy (EF 50-55% by echo 2018), Mild AS, DVT (2006), Permanent Afib (on coumadin), HTN, CVA (noted on MRI pt had after head injury), GERD, Recurrent PNA (most recent 1 month ago although CXR at time of diagnosis was benign, pt reports symptoms resolved with abx).  Pt follows with cardiology, Dr. Radford Pax. Per last OV note 01/25/18 the pt was doing well, "She denies any chest pain or pressure, PND, orthopnea, LE edema, dizziness, palpitations or syncope. She has chronic DOE that occurs when she is hurrying and has had for some time."  Lexiscan 01/21/2017 showed No ischemia but was intermediate risk due to EF 46%. Echo done same day showed EF 50-55% with mild AS, mild MR, and mod biatrial enlargement. With that result, Dr. Radford Pax commented on the stress test stating "No ischemia and low normal LVF by echo with no wall motion abnormality therefore overall low risk study."   Per Dr. Coralyn Pear she does not need to stop her coumadin for the procedure. Pt has not had INR checked since 10/21/17. She was seen at coumadin clinic 03/30/18 for check, INR 3.5, instructed to skip next dose then resume as prescribed.  Given recent cardiac followup and pt is asymptomatic, anticipate she can proceed as planned barring acute status change.   VS: Ht 5\' 5"  (1.651 m)   Wt 104.3 kg   BMI 38.27 kg/m   PROVIDERS: [name], MD is PCP  Fransico Him, MD is Cardiologist  LABS: Will need DOS labs  Labs Reviewed - No data to display   IMAGES: CHEST - 2 VIEW 02/28/18  COMPARISON:   Chest radiographs 02/03/2015.  FINDINGS: Lung volumes and mediastinal contours are within normal limits. Visualized tracheal air column is within normal limits. No pneumothorax, pleural effusion, pulmonary edema or confluent pulmonary opacity. Mild chronic increased pulmonary interstitial markings. Chronic right shoulder arthroplasty. No acute osseous abnormality identified. Negative visible bowel gas pattern.  IMPRESSION: No acute cardiopulmonary abnormality.   EKG:   CV:  Past Medical History:  Diagnosis Date  . ABDOMINAL PAIN RIGHT UPPER QUADRANT 11/08/2008  . ALLERGIC RHINITIS 08/01/2009  . Anxiety   . Complication of anesthesia 01/31/2015   ? doesn't remember anything after being taken to pre surgery  . Coronary artery calcification seen on CAT scan 05/30/2009   moderate risk coronary calcium score by chest CT  . DDD (degenerative disc disease), lumbar   . DEPRESSION 06/13/2008  . DYSLIPIDEMIA 02/27/2009  . DYSPNEA 02/27/2009  . Dysrhythmia    Afib  . Frequency of urination   . GERD (gastroesophageal reflux disease)    "years ago"  . Head injury 2016   did not loose consciousness  . History of Bell's palsy   . History of DVT of lower extremity 2006-- CHRONIC COUMADIN THERAPY  . History of hiatal hernia   . History of Lyme disease   . 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 Hypertension   . NON-HODGKIN'S LYMPHOMA, HX OF dx  2005--  chemoradiation completed 2006--  no recurrence   onocologist- dr odogwu--   . OSTEOARTHRITIS, MODERATE 04/07/2010  . Permanent atrial fibrillation    On chronic anti-coagulation with warfarin  . Pneumonia    x 3 - last time 02/2018  . Retroperitoneal fibrosis   . Stroke [facility])    ? - [place]- "you have had 3 strokes"    Past Surgical History:  Procedure Laterality Date  . BREAST MASS EXCISION     BENIGN-- RIGHT BREAST  . CARDIOVASCULAR STRESS TEST  04-04-2009-- PER PT ASYMPTOMATIC-- MEDICAL  MANAGEMENT   STUDY WAS EQUIVOCAL/ EF 49%/ GLOBAL HYPOKINESIS/ APPEARS TO BE A MILD ANTERIOR PERFUSION DEFECT COULD REPRESENT ISCHEMIA BUT DUE TO  ACTIVITY WORSE IN STRESS THAN AT REST POSS. THE DEFECT WAS ARTIFACTUAL  . CARDIOVERSION N/A 07/11/2015   Procedure: CARDIOVERSION;  Surgeon: Larey Dresser, MD;  Location: [place];  Service: Cardiovascular;  Laterality: N/A;  . CT ANGIOGRAM  FEB 2011   CALCIUM SCORE 161/ POSS. MODERATE MID LAD STENOSIS  . CYSTOSCOPY W/ RETROGRADES  04/28/2011   Procedure: CYSTOSCOPY WITH RETROGRADE PYELOGRAM;  Surgeon: Fredricka Bonine, MD;  Location: [facility];  Service: Urology;  Laterality: Left;  . CYSTOSCOPY W/ URETERAL STENT REMOVAL  04/28/2011   Procedure: CYSTOSCOPY WITH STENT REMOVAL;  Surgeon: Fredricka Bonine, MD;  Location: [facility];  Service: Urology;;  . EXPLORATORY LAPAROTOMY  2005   LYMPHOMA  . EYE SURGERY Bilateral    cataract   . KNEE ARTHROSCOPY  2005   RIGHT  . MULTIPLE CYSTO/ LEFT URETERAL STENT EXCHANGES  LAST ONE 10-28-10  . RETROPERITONEAL BX  2007  . REVERSE SHOULDER ARTHROPLASTY Right 01/31/2015   Procedure: RIGHT REVERSE SHOULDER ARTHROPLASTY;  Surgeon: Justice Britain, MD;  Location: [place];  Service: Orthopedics;  Laterality: Right;  . TONSILLECTOMY  CHILD  . TOTAL KNEE ARTHROPLASTY  OCT 2010   RIGHT  . TOTAL KNEE ARTHROPLASTY  JAN 2010   LEFT  . TRANSTHORACIC ECHOCARDIOGRAM  [phone]   MILD SYSTOLIC DYSFUNCTION/ EF 63-84%/ MODERATE DIASTOLIC DYSFUCTION/ MILD MR/ MILD BIATRIAL ENLARGEMENT    MEDICATIONS: No current facility-administered medications for this encounter.    Marland Kitchen ALPRAZolam (XANAX) 1 MG tablet  . b complex vitamins capsule  . Biotin 5 MG CAPS  . buPROPion (WELLBUTRIN XL) 300 MG 24 hr tablet  . Cholecalciferol (VITAMIN D) 2000 UNITS CAPS  . Coenzyme Q10-Fish Oil-Vit E (CO-Q 10 OMEGA-3 FISH OIL PO)  . diltiazem (CARDIZEM CD) 180 MG 24 hr capsule  . DULoxetine  (CYMBALTA) 60 MG capsule  . fluticasone (FLONASE) 50 MCG/ACT nasal spray  . furosemide (LASIX) 20 MG tablet  . LIVALO 2 MG TABS  . loratadine (CLARITIN) 10 MG tablet  . losartan (COZAAR) 25 MG tablet  . Lysine 500 MG TABS  . metoprolol succinate (TOPROL-XL) 25 MG 24 hr tablet  . nitroGLYCERIN (NITROSTAT) 0.4 MG SL tablet  . valACYclovir (VALTREX) 1000 MG tablet  . warfarin (COUMADIN) 5 MG tablet     [name]/Anesthesiology Phone [phone] 03/29/2018 10:45 AM

## 2018-03-30 NOTE — Progress Notes (Signed)
CHIEF COMPLAINT Patient presents for Post-op Follow-up   HISTORY OF PRESENT ILLNESS: [name] is a 73 y.o. female who presents to the clinic today for:   HPI    Post-op Follow-up    In right eye.  Discomfort includes pain.  Negative for itching, foreign body sensation, tearing, discharge and floaters.  Vision is stable.  I, the attending physician,  performed the HPI with the patient and updated documentation appropriately.          Comments    POV #1 Patient states she had pain _0  she took Vicodin with relief, She slept in [employer] chair , she maintained head down 90 % of the time as instructed. Pt had pain only with movement this am. Pt states she can see light .        Last edited by Bernarda Caffey, MD on 04/01/2018  9:35 AM. (History)      Referring physician: [name], MD [place], [place] [id number]  HISTORICAL INFORMATION:   Selected notes from the MEDICAL RECORD NUMBER Referred by Dr. Delman Cheadle for concern of mac off RD LEE- 02.04.19  Ocular Hx-  PMH- A-Fib on coumadin, severe anxiety, CAD, depression    CURRENT MEDICATIONS: No current outpatient medications on file. (Ophthalmic Drugs)   No current facility-administered medications for this visit.  (Ophthalmic Drugs)   Current Outpatient Medications (Other)  Medication Sig  . ALPRAZolam (XANAX) 1 MG tablet TAKE 1 TABLET BY MOUTH THREE TIMES A DAY AS NEEDED  . b complex vitamins capsule Take 2 capsules by mouth daily.   . Biotin 5 MG CAPS Take 1 capsule by mouth daily.   Marland Kitchen buPROPion (WELLBUTRIN XL) 300 MG 24 hr tablet TAKE 1 TABLET BY MOUTH EVERY DAY  . Cholecalciferol (VITAMIN D) 2000 UNITS CAPS Take 2,000 Int'l Units by mouth.  . Coenzyme Q10-Fish Oil-Vit E (CO-Q 10 OMEGA-3 FISH OIL PO) Take 100 mg by mouth daily.   Marland Kitchen diltiazem (CARDIZEM CD) 180 MG 24 hr capsule Take 1 capsule (180 mg total) by mouth daily.  . DULoxetine (CYMBALTA) 60 MG capsule TAKE 1 CAPSULE BY MOUTH EVERY  DAY  . fluticasone (FLONASE) 50 MCG/ACT nasal spray instill 2 sprays into each nostril once daily (Patient taking differently: Place 2 sprays into both nostrils daily as needed for allergies. )  . furosemide (LASIX) 20 MG tablet TAKE 1 TABLET (20 MG TOTAL) BY MOUTH DAILY. PLEASE KEEP UPCOMING APPOINTMENT FOR FURTHER REFILLS  . HYDROcodone-acetaminophen (NORCO/VICODIN) 5-325 MG tablet Take 1 tablet by mouth every 4 (four) hours as needed for moderate pain.  Marland Kitchen LIVALO 2 MG TABS TAKE 1 TABLET BY MOUTH EVERY DAY  . loratadine (CLARITIN) 10 MG tablet Take 10 mg by mouth daily.   Marland Kitchen losartan (COZAAR) 25 MG tablet TAKE 1 TABLET EVERY DAY  . Lysine 500 MG TABS Take 500 mg by mouth daily.  . metoprolol succinate (TOPROL-XL) 25 MG 24 hr tablet TAKE 1 TABLET EVERY DAY  . nitroGLYCERIN (NITROSTAT) 0.4 MG SL tablet place 1 tablet under the tongue every 5 minutes for UP TO 3 doses if needed  . valACYclovir (VALTREX) 1000 MG tablet Take 2 tablets at onset of cold sores and repeat 2 in 12 hours. (Patient taking differently: Take 2,000 mg by mouth See admin instructions. Take 2 tablets at onset of cold sores and repeat 2 in 12 hours.)  . warfarin (COUMADIN) 5 MG tablet TAKE 1/2 TO 1 TABLET DAILY AS DIRECTED BY COUMADIN CLINIC.  No current facility-administered medications for this visit.  (Other)      REVIEW OF SYSTEMS: ROS    Positive for: Eyes   Negative for: Constitutional, Gastrointestinal, Neurological, Skin, Genitourinary, Musculoskeletal, HENT, Endocrine, Cardiovascular, Respiratory, Psychiatric, Allergic/Imm, Heme/Lymph   Last edited by Zenovia Jordan, LPN on 07/26/3974  7:34 AM. (History)       ALLERGIES Allergies  Allergen Reactions  . Chlorhexidine Base Itching    CHG WIPES  . Penicillins Hives and Rash    DID THE REACTION INVOLVE: Swelling of the face/tongue/throat, SOB, or low BP? No Sudden or severe rash/hives, skin peeling, or the inside of the mouth or nose? No Did it require  medical treatment? No When did it last happen?90s If all above answers are "NO", may proceed with cephalosporin use.     PAST MEDICAL HISTORY Past Medical History:  Diagnosis Date  . ABDOMINAL PAIN RIGHT UPPER QUADRANT 11/08/2008  . ALLERGIC RHINITIS 08/01/2009  . Anxiety   . Complication of anesthesia 01/31/2015   ? doesn't remember anything after being taken to pre surgery  . Coronary artery calcification seen on CAT scan 05/30/2009   moderate risk coronary calcium score by chest CT  . DDD (degenerative disc disease), lumbar   . DEPRESSION 06/13/2008  . DYSLIPIDEMIA 02/27/2009  . DYSPNEA 02/27/2009  . Dysrhythmia    Afib  . Frequency of urination   . GERD (gastroesophageal reflux disease)    "years ago"  . Head injury 2016   did not loose consciousness  . History of Bell's palsy   . History of DVT of lower extremity 2006-- CHRONIC COUMADIN THERAPY  . History of hiatal hernia   . History of Lyme disease   . 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 Hypertension   . NON-HODGKIN'S LYMPHOMA, HX OF dx  2005--  chemoradiation completed 2006--  no recurrence   onocologist- dr odogwu--   . OSTEOARTHRITIS, MODERATE 04/07/2010  . Permanent atrial fibrillation    On chronic anti-coagulation with warfarin  . Pneumonia    x 3 - last time 02/2018  . Retroperitoneal fibrosis   . Stroke [facility])    ? - [place]- "you have had 3 strokes"   Past Surgical History:  Procedure Laterality Date  . BREAST MASS EXCISION     BENIGN-- RIGHT BREAST  . CARDIOVASCULAR STRESS TEST  04-04-2009-- PER PT ASYMPTOMATIC-- MEDICAL MANAGEMENT   STUDY WAS EQUIVOCAL/ EF 49%/ GLOBAL HYPOKINESIS/ APPEARS TO BE A MILD ANTERIOR PERFUSION DEFECT COULD REPRESENT ISCHEMIA BUT DUE TO  ACTIVITY WORSE IN STRESS THAN AT REST POSS. THE DEFECT WAS ARTIFACTUAL  . CARDIOVERSION N/A 07/11/2015   Procedure: CARDIOVERSION;  Surgeon: Larey Dresser, MD;  Location: [facility];  Service:  Cardiovascular;  Laterality: N/A;  . CT ANGIOGRAM  FEB 2011   CALCIUM SCORE 161/ POSS. MODERATE MID LAD STENOSIS  . CYSTOSCOPY W/ RETROGRADES  04/28/2011   Procedure: CYSTOSCOPY WITH RETROGRADE PYELOGRAM;  Surgeon: Fredricka Bonine, MD;  Location: [facility];  Service: Urology;  Laterality: Left;  . CYSTOSCOPY W/ URETERAL STENT REMOVAL  04/28/2011   Procedure: CYSTOSCOPY WITH STENT REMOVAL;  Surgeon: Fredricka Bonine, MD;  Location: [facility];  Service: Urology;;  . EXPLORATORY LAPAROTOMY  2005   LYMPHOMA  . EYE SURGERY Bilateral    cataract   . KNEE ARTHROSCOPY  2005   RIGHT  . MULTIPLE CYSTO/ LEFT URETERAL STENT EXCHANGES  LAST ONE 10-28-10  . RETROPERITONEAL BX  2007  .  REVERSE SHOULDER ARTHROPLASTY Right 01/31/2015   Procedure: RIGHT REVERSE SHOULDER ARTHROPLASTY;  Surgeon: Justice Britain, MD;  Location: [facility];  Service: Orthopedics;  Laterality: Right;  . TONSILLECTOMY  CHILD  . TOTAL KNEE ARTHROPLASTY  OCT 2010   RIGHT  . TOTAL KNEE ARTHROPLASTY  JAN 2010   LEFT  . TRANSTHORACIC ECHOCARDIOGRAM  [id number]   MILD SYSTOLIC DYSFUNCTION/ EF 97-02%/ MODERATE DIASTOLIC DYSFUCTION/ MILD MR/ MILD BIATRIAL ENLARGEMENT    FAMILY HISTORY Family History  Problem Relation Age of Onset  . Arthritis Mother   . Heart disease Mother   . Heart attack Mother   . Diabetes Sister   . Hypertension Sister   . Stroke Sister   . Heart attack Sister     SOCIAL HISTORY Social History   Tobacco Use  . Smoking status: Former Smoker    Packs/day: 2.00    Years: 52.00    Pack years: 104.00    Types: Cigarettes    Last attempt to quit: 03/24/2008    Years since quitting: 10.0  . Smokeless tobacco: Never Used  Substance Use Topics  . Alcohol use: Not Currently    Comment: RARE  . Drug use: No         OPHTHALMIC EXAM:  Base Eye Exam    Visual Acuity (Snellen - Linear)      Right Left   Dist [place] HM 20/40       Tonometry (Tonopen, 9:20 AM)       Right Left   Pressure 18 12       Pupils      Dark Light Shape React APD   Right 7  Round  None   Left 4 3 Round Brisk None       Visual Fields (Counting fingers)      Left Right    Full        Extraocular Movement      Right Left    Full, Ortho Full, Ortho       Neuro/Psych    Oriented x3:  Yes   Mood/Affect:  Normal       Dilation    Both eyes:  1.0% Mydriacyl, 2.5% Phenylephrine @ 9:15 AM        Slit Lamp and Fundus Exam    External Exam      Right Left   External Periorbital edema        Slit Lamp Exam      Right Left   Lids/Lashes Dermatochalasis - upper lid; periorbital edema and ecchymosis Dermatochalasis - upper lid,  Mild Telangiectasia   Conjunctiva/Sclera [place]; sutures intact; mild hooding superiorly White and quiet   Cornea Arcus, central epi defect, Well healed cataract wounds; Arcus, 2+ fine Punctate epithelial erosions   Anterior Chamber Deep; mild fibrin rxn Deep and quiet   Iris Round and dilated Round and dilated   Lens PC IOL in good position, trace Posterior capsular opacification PC IOL with PC folds   Vitreous post vitrectomy; good gas fill Posterior vitreous detachment, Vitreous syneresis       Fundus Exam      Right Left   Disc Temporal Peripapillary atrophy, otherwise normal Tilted disc, Peripapillary atrophy   C/D Ratio 0.3 0.3   Macula flat under gas attached; drusen; RPE mottling and clumping, No heme or edema   Vessels Vascular attenuation Vascular attenuation   Periphery Retina attached over buckle; good buckle height; good laser over and posterior to buckle; Sup temporal cryo scars; choroidal  hemorrhages inferior and nasal; inferior extending pretty posteriorly Attached          IMAGING AND PROCEDURES  Imaging and Procedures for 07/14/17           ASSESSMENT/PLAN:    ICD-10-CM   1. Right retinal detachment H33.21   2. Retinal edema H35.81   3. Intermediate stage nonexudative age-related macular degeneration of  both eyes H35.3132   4. Combined form of age-related cataract, both eyes H25.813     1. Retinal detachment, OD-  - initial presentation: macula- and fovea-involving rhegmatogenous retinal detachment - initial OCT showed IRF/cystic changes suggestive of chronic detachment - supero-temporal detachment from 0730 to 1200 with small horseshoe tear located at 1030 and large horseshoe tear located at 1100 - s/p pneumatic cryopexy OD (02.04.19) - was doing well post pneumatic and then was lost to f/u from 4.23.19 - 1.3.20, pt presents urgently with nasal hemisphere field defect and found to have recurrent macula-involving, fovea-splitting, temporal retinal detachment - no frank tear noted but SRF extends from old cryo scar (1000 - 0700, temporal quad) - interestingly BCVA was 20/50 OD on 1.3.20 despite detachment - now POD1 s/p SBP (41 band) + PPV/PFO/EL/FAX/14% C3F8 OD, 1.9.20             - doing well this morning             - retina attached and in good position -- good buckle height and laser around breaks  - choroidal hemorrhages inf and nasal -- likely related to coumadin use             - IOP ok             - start   PF q2h OD                         zymaxid QID OD                         Atropine BID OD   Cosopt BID OD                         PSO ung QID OD             - cont face down positioning x7 days; avoid laying flat on back             - eye shield when sleeping             - post op drop and positioning instructions reviewed             - tylenol/ibuprofen for pain             - Rx given for breakthrough pain - f/u 1 wk  2. Retinal edema  - detached retina had cystic changes / IRF suggestive of chronic detachment both times  3. Age related macular degeneration, non-exudative, both eyes  - The incidence, anatomy, and pathology of dry AMD, risk of progression, and the AREDS and AREDS 2 study including smoking risks discussed with patient.  - Recommend amsler grid  monitoring  - continue present management  4. Pseudophakia OU  - s/p CE/IOL OU  - beautiful surgeries, doing well -- performed by Dr. Read Drivers, November 2019  - monitor    Ophthalmic Meds Ordered this visit:  No orders of the defined types were placed in this encounter.  Return in about 1 week (around 04/08/2018) for POV.  There are no Patient Instructions on file for this visit.   Explained the diagnoses, plan, and follow up with the patient and they expressed understanding.  Patient expressed understanding of the importance of proper follow up care.   This document serves as a record of services personally performed by Gardiner Sleeper, MD, PhD. It was created on their behalf by Ernest Mallick, OA, an ophthalmic assistant. The creation of this record is the provider's dictation and/or activities during the visit.    Electronically signed by: Ernest Mallick, OA  01.03.2020 1:10 AM    Gardiner Sleeper, M.D., Ph.D. Diseases & Surgery of the Retina and Vitreous [facility]  I have reviewed the above documentation for accuracy and completeness, and I agree with the above. Gardiner Sleeper, M.D., Ph.D. 03/27/18 1:10 AM    Abbreviations: M myopia (nearsighted); A astigmatism; H hyperopia (farsighted); P presbyopia; Mrx spectacle prescription;  CTL contact lenses; OD right eye; OS left eye; OU both eyes  XT exotropia; ET esotropia; PEK punctate epithelial keratitis; PEE punctate epithelial erosions; DES dry eye syndrome; MGD meibomian gland dysfunction; ATs artificial tears; PFAT's preservative free artificial tears; [employer] nuclear sclerotic cataract; PSC posterior subcapsular cataract; ERM epi-retinal membrane; PVD posterior vitreous detachment; RD retinal detachment; DM diabetes mellitus; DR diabetic retinopathy; NPDR non-proliferative diabetic retinopathy; PDR proliferative diabetic retinopathy; CSME clinically significant macular edema; DME diabetic macular edema;  dbh dot blot hemorrhages; CWS cotton wool spot; POAG primary open angle glaucoma; C/D cup-to-disc ratio; HVF humphrey visual field; GVF goldmann visual field; OCT optical coherence tomography; IOP intraocular pressure; BRVO Branch retinal vein occlusion; CRVO central retinal vein occlusion; CRAO central retinal artery occlusion; BRAO branch retinal artery occlusion; RT retinal tear; SB scleral buckle; PPV pars plana vitrectomy; VH Vitreous hemorrhage; PRP panretinal laser photocoagulation; IVK intravitreal kenalog; VMT vitreomacular traction; MH Macular hole;  NVD neovascularization of the disc; NVE neovascularization elsewhere; AREDS age related eye disease study; ARMD age related macular degeneration; POAG primary open angle glaucoma; EBMD epithelial/anterior basement membrane dystrophy; ACIOL anterior chamber intraocular lens; IOL intraocular lens; PCIOL posterior chamber intraocular lens; Phaco/IOL phacoemulsification with intraocular lens placement; [place] photorefractive keratectomy; LASIK laser assisted in situ keratomileusis; HTN hypertension; DM diabetes mellitus; COPD chronic obstructive pulmonary disease

## 2018-03-31 ENCOUNTER — Ambulatory Visit (HOSPITAL_COMMUNITY): Payer: Medicare Other | Admitting: Physician Assistant

## 2018-03-31 ENCOUNTER — Ambulatory Visit (HOSPITAL_COMMUNITY)
Admission: RE | Admit: 2018-03-31 | Discharge: 2018-03-31 | Disposition: A | Discharge status: Home / Self Care | Payer: Medicare Other | Attending: Ophthalmology | Admitting: Ophthalmology

## 2018-03-31 ENCOUNTER — Encounter (HOSPITAL_COMMUNITY): Payer: Self-pay

## 2018-03-31 ENCOUNTER — Other Ambulatory Visit: Payer: Self-pay

## 2018-03-31 ENCOUNTER — Encounter (HOSPITAL_COMMUNITY)
Admission: RE | Disposition: A | Discharge status: Home / Self Care | Payer: Self-pay | Source: Home / Self Care | Attending: Ophthalmology

## 2018-03-31 DIAGNOSIS — J309 Allergic rhinitis, unspecified: Secondary | ICD-10-CM | POA: Insufficient documentation

## 2018-03-31 DIAGNOSIS — Z79899 Other long term (current) drug therapy: Secondary | ICD-10-CM | POA: Insufficient documentation

## 2018-03-31 DIAGNOSIS — Z86718 Personal history of other venous thrombosis and embolism: Secondary | ICD-10-CM | POA: Insufficient documentation

## 2018-03-31 DIAGNOSIS — I4821 Permanent atrial fibrillation: Secondary | ICD-10-CM | POA: Diagnosis not present

## 2018-03-31 DIAGNOSIS — I420 Dilated cardiomyopathy: Secondary | ICD-10-CM | POA: Insufficient documentation

## 2018-03-31 DIAGNOSIS — I110 Hypertensive heart disease with heart failure: Secondary | ICD-10-CM | POA: Diagnosis not present

## 2018-03-31 DIAGNOSIS — H33011 Retinal detachment with single break, right eye: Secondary | ICD-10-CM | POA: Insufficient documentation

## 2018-03-31 DIAGNOSIS — I2510 Atherosclerotic heart disease of native coronary artery without angina pectoris: Secondary | ICD-10-CM | POA: Diagnosis not present

## 2018-03-31 DIAGNOSIS — Z8572 Personal history of non-Hodgkin lymphomas: Secondary | ICD-10-CM | POA: Diagnosis not present

## 2018-03-31 DIAGNOSIS — E785 Hyperlipidemia, unspecified: Secondary | ICD-10-CM | POA: Diagnosis not present

## 2018-03-31 DIAGNOSIS — I4891 Unspecified atrial fibrillation: Secondary | ICD-10-CM | POA: Diagnosis not present

## 2018-03-31 DIAGNOSIS — H338 Other retinal detachments: Secondary | ICD-10-CM | POA: Diagnosis not present

## 2018-03-31 DIAGNOSIS — F329 Major depressive disorder, single episode, unspecified: Secondary | ICD-10-CM | POA: Insufficient documentation

## 2018-03-31 DIAGNOSIS — Z9221 Personal history of antineoplastic chemotherapy: Secondary | ICD-10-CM | POA: Diagnosis not present

## 2018-03-31 DIAGNOSIS — Z96653 Presence of artificial knee joint, bilateral: Secondary | ICD-10-CM | POA: Diagnosis not present

## 2018-03-31 DIAGNOSIS — H33001 Unspecified retinal detachment with retinal break, right eye: Secondary | ICD-10-CM | POA: Diagnosis not present

## 2018-03-31 DIAGNOSIS — I509 Heart failure, unspecified: Secondary | ICD-10-CM | POA: Insufficient documentation

## 2018-03-31 DIAGNOSIS — F419 Anxiety disorder, unspecified: Secondary | ICD-10-CM | POA: Diagnosis not present

## 2018-03-31 DIAGNOSIS — Z7951 Long term (current) use of inhaled steroids: Secondary | ICD-10-CM | POA: Diagnosis not present

## 2018-03-31 DIAGNOSIS — Z87891 Personal history of nicotine dependence: Secondary | ICD-10-CM | POA: Insufficient documentation

## 2018-03-31 DIAGNOSIS — Z7901 Long term (current) use of anticoagulants: Secondary | ICD-10-CM | POA: Diagnosis not present

## 2018-03-31 DIAGNOSIS — Z923 Personal history of irradiation: Secondary | ICD-10-CM | POA: Diagnosis not present

## 2018-03-31 HISTORY — PX: GAS INSERTION: SHX5336

## 2018-03-31 HISTORY — DX: Adverse effect of unspecified anesthetic, initial encounter: T41.45XA

## 2018-03-31 HISTORY — DX: Unspecified injury of head, initial encounter: S09.90XA

## 2018-03-31 HISTORY — DX: Cerebral infarction, unspecified: I63.9

## 2018-03-31 HISTORY — PX: LASER PHOTO ABLATION: SHX5942

## 2018-03-31 HISTORY — DX: Cardiac arrhythmia, unspecified: I49.9

## 2018-03-31 HISTORY — DX: Essential (primary) hypertension: I10

## 2018-03-31 HISTORY — DX: Pneumonia, unspecified organism: J18.9

## 2018-03-31 HISTORY — PX: VITRECTOMY 25 GAUGE WITH SCLERAL BUCKLE: SHX6183

## 2018-03-31 LAB — CBC
HCT: 39.4 % (ref 36.0–46.0)
Hemoglobin: 12.6 g/dL (ref 12.0–15.0)
MCH: 30.1 pg (ref 26.0–34.0)
MCHC: 32 g/dL (ref 30.0–36.0)
MCV: 94.3 fL (ref 80.0–100.0)
Platelets: 204 10*3/uL (ref 150–400)
RBC: 4.18 MIL/uL (ref 3.87–5.11)
RDW: 13.8 % (ref 11.5–15.5)
WBC: 5.4 10*3/uL (ref 4.0–10.5)
nRBC: 0 % (ref 0.0–0.2)

## 2018-03-31 LAB — BASIC METABOLIC PANEL
Anion gap: 8 (ref 5–15)
BUN: 21 mg/dL (ref 8–23)
CO2: 23 mmol/L (ref 22–32)
Calcium: 9 mg/dL (ref 8.9–10.3)
Chloride: 108 mmol/L (ref 98–111)
Creatinine, Ser: 1.12 mg/dL — ABNORMAL HIGH (ref 0.44–1.00)
GFR calc Af Amer: 56 mL/min — ABNORMAL LOW (ref >60)
GFR calc non Af Amer: 48 mL/min — ABNORMAL LOW (ref >60)
Glucose, Bld: 165 mg/dL — ABNORMAL HIGH (ref 70–99)
POTASSIUM: 3.9 mmol/L (ref 3.5–5.1)
Sodium: 139 mmol/L (ref 135–145)

## 2018-03-31 LAB — PROTIME-INR
INR: 2.28
Prothrombin Time: 24.8 seconds — ABNORMAL HIGH (ref 11.4–15.2)

## 2018-03-31 SURGERY — VITRECTOMY, USING 25-GAUGE INSTRUMENTS, WITH SCLERAL BUCKLING
Anesthesia: General | Site: Eye | Laterality: Right

## 2018-03-31 MED ORDER — ONDANSETRON HCL 4 MG/2ML IJ SOLN
INTRAMUSCULAR | Status: DC | PRN
  Administered 2018-03-31: 4 mg via INTRAVENOUS

## 2018-03-31 MED ORDER — BRIMONIDINE TARTRATE-TIMOLOL 0.2-0.5 % OP SOLN
OPHTHALMIC | Status: DC | PRN
  Administered 2018-03-31: 1 [drp] via OPHTHALMIC

## 2018-03-31 MED ORDER — DEXAMETHASONE SODIUM PHOSPHATE 10 MG/ML IJ SOLN
INTRAMUSCULAR | Status: AC
  Filled 2018-03-31: qty 1

## 2018-03-31 MED ORDER — GATIFLOXACIN 0.5 % OP SOLN
OPHTHALMIC | Status: AC
  Filled 2018-03-31: qty 2.5

## 2018-03-31 MED ORDER — PREDNISOLONE ACETATE 1 % OP SUSP
OPHTHALMIC | Status: AC
  Filled 2018-03-31: qty 5

## 2018-03-31 MED ORDER — FENTANYL CITRATE (PF) 100 MCG/2ML IJ SOLN: 25.0000 ug | INTRAMUSCULAR | Status: DC | PRN

## 2018-03-31 MED ORDER — CEFTAZIDIME 1 G IJ SOLR
INTRAMUSCULAR | Status: AC
  Filled 2018-03-31: qty 1

## 2018-03-31 MED ORDER — BUPIVACAINE HCL (PF) 0.75 % IJ SOLN
INTRAMUSCULAR | Status: DC | PRN
  Administered 2018-03-31: 10 mL

## 2018-03-31 MED ORDER — NA CHONDROIT SULF-NA HYALURON 40-30 MG/ML IO SOLN
INTRAOCULAR | Status: AC
  Filled 2018-03-31: qty 0.5

## 2018-03-31 MED ORDER — LIDOCAINE 2% (20 MG/ML) 5 ML SYRINGE
INTRAMUSCULAR | Status: AC
  Filled 2018-03-31: qty 10

## 2018-03-31 MED ORDER — SODIUM HYALURONATE 10 MG/ML IO SOLN
INTRAOCULAR | Status: AC
  Filled 2018-03-31: qty 0.85

## 2018-03-31 MED ORDER — OXYCODONE HCL 5 MG/5ML PO SOLN: 5.0000 mg | Freq: Once | ORAL | Status: AC | PRN

## 2018-03-31 MED ORDER — ONDANSETRON HCL 4 MG/2ML IJ SOLN
INTRAMUSCULAR | Status: AC
  Filled 2018-03-31: qty 2

## 2018-03-31 MED ORDER — ATROPINE SULFATE 1 % OP SOLN
1.0000 [drp] | OPHTHALMIC | Status: AC | PRN
  Administered 2018-03-31 (×3): 1 [drp] via OPHTHALMIC
  Filled 2018-03-31: qty 2

## 2018-03-31 MED ORDER — ATROPINE SULFATE 1 % OP SOLN
OPHTHALMIC | Status: DC | PRN
  Administered 2018-03-31: 1 [drp] via OPHTHALMIC

## 2018-03-31 MED ORDER — FENTANYL CITRATE (PF) 250 MCG/5ML IJ SOLN
INTRAMUSCULAR | Status: AC
  Filled 2018-03-31: qty 5

## 2018-03-31 MED ORDER — STERILE WATER FOR INJECTION IJ SOLN
INTRAMUSCULAR | Status: DC | PRN
  Administered 2018-03-31: 20 mL

## 2018-03-31 MED ORDER — OXYCODONE HCL 5 MG PO TABS
ORAL_TABLET | ORAL | Status: AC
  Filled 2018-03-31: qty 1

## 2018-03-31 MED ORDER — ROCURONIUM BROMIDE 10 MG/ML (PF) SYRINGE
PREFILLED_SYRINGE | INTRAVENOUS | Status: DC | PRN
  Administered 2018-03-31: 50 mg via INTRAVENOUS

## 2018-03-31 MED ORDER — BRIMONIDINE TARTRATE 0.2 % OP SOLN
OPHTHALMIC | Status: AC
  Filled 2018-03-31: qty 5

## 2018-03-31 MED ORDER — PHENYLEPHRINE HCL 10 % OP SOLN
1.0000 [drp] | OPHTHALMIC | Status: AC | PRN
  Administered 2018-03-31 (×3): 1 [drp] via OPHTHALMIC
  Filled 2018-03-31: qty 5

## 2018-03-31 MED ORDER — BACITRACIN-POLYMYXIN B 500-10000 UNIT/GM OP OINT
TOPICAL_OINTMENT | OPHTHALMIC | Status: AC
  Filled 2018-03-31: qty 3.5

## 2018-03-31 MED ORDER — LIDOCAINE HCL 1 % IJ SOLN
INTRAMUSCULAR | Status: DC | PRN
  Administered 2018-03-31: 20 mL

## 2018-03-31 MED ORDER — TROPICAMIDE 1 % OP SOLN
1.0000 [drp] | OPHTHALMIC | Status: AC | PRN
  Administered 2018-03-31 (×3): 1 [drp] via OPHTHALMIC
  Filled 2018-03-31: qty 15

## 2018-03-31 MED ORDER — BUPIVACAINE HCL (PF) 0.75 % IJ SOLN
INTRAMUSCULAR | Status: AC
  Filled 2018-03-31: qty 10

## 2018-03-31 MED ORDER — PHENYLEPHRINE 40 MCG/ML (10ML) SYRINGE FOR IV PUSH (FOR BLOOD PRESSURE SUPPORT)
PREFILLED_SYRINGE | INTRAVENOUS | Status: AC
  Filled 2018-03-31: qty 10

## 2018-03-31 MED ORDER — BSS IO SOLN
INTRAOCULAR | Status: AC
  Filled 2018-03-31: qty 15

## 2018-03-31 MED ORDER — EPINEPHRINE PF 1 MG/ML IJ SOLN
INTRAMUSCULAR | Status: AC
  Filled 2018-03-31: qty 1

## 2018-03-31 MED ORDER — FENTANYL CITRATE (PF) 250 MCG/5ML IJ SOLN
INTRAMUSCULAR | Status: DC | PRN
  Administered 2018-03-31 (×2): 50 ug via INTRAVENOUS

## 2018-03-31 MED ORDER — HYALURONIDASE HUMAN 150 UNIT/ML IJ SOLN
INTRAMUSCULAR | Status: AC
  Filled 2018-03-31: qty 1

## 2018-03-31 MED ORDER — ROCURONIUM BROMIDE 50 MG/5ML IV SOSY
PREFILLED_SYRINGE | INTRAVENOUS | Status: AC
  Filled 2018-03-31: qty 10

## 2018-03-31 MED ORDER — PREDNISOLONE ACETATE 1 % OP SUSP
OPHTHALMIC | Status: DC | PRN
  Administered 2018-03-31: 1 [drp] via OPHTHALMIC

## 2018-03-31 MED ORDER — STERILE WATER FOR INJECTION IJ SOLN
INTRAMUSCULAR | Status: AC
  Filled 2018-03-31: qty 20

## 2018-03-31 MED ORDER — SODIUM CHLORIDE (PF) 0.9 % IJ SOLN
INTRAMUSCULAR | Status: AC
  Filled 2018-03-31: qty 10

## 2018-03-31 MED ORDER — PROPOFOL 10 MG/ML IV BOLUS
INTRAVENOUS | Status: AC
  Filled 2018-03-31: qty 20

## 2018-03-31 MED ORDER — POLYMYXIN B SULFATE 500000 UNITS IJ SOLR
INTRAMUSCULAR | Status: AC
  Filled 2018-03-31: qty 500000

## 2018-03-31 MED ORDER — SUGAMMADEX SODIUM 200 MG/2ML IV SOLN
INTRAVENOUS | Status: DC | PRN
  Administered 2018-03-31: 200 mg via INTRAVENOUS

## 2018-03-31 MED ORDER — PROPOFOL 10 MG/ML IV BOLUS
INTRAVENOUS | Status: DC | PRN
  Administered 2018-03-31: 150 mg via INTRAVENOUS

## 2018-03-31 MED ORDER — TRIAMCINOLONE ACETONIDE 40 MG/ML IJ SUSP
INTRAMUSCULAR | Status: DC | PRN
  Administered 2018-03-31: 40 mg

## 2018-03-31 MED ORDER — LIDOCAINE HCL 2 % IJ SOLN
INTRAMUSCULAR | Status: AC
  Filled 2018-03-31: qty 20

## 2018-03-31 MED ORDER — ONDANSETRON HCL 4 MG/2ML IJ SOLN: 4.0000 mg | Freq: Once | INTRAMUSCULAR | Status: DC | PRN

## 2018-03-31 MED ORDER — MIDAZOLAM HCL 2 MG/2ML IJ SOLN
INTRAMUSCULAR | Status: AC
  Filled 2018-03-31: qty 2

## 2018-03-31 MED ORDER — DORZOLAMIDE HCL-TIMOLOL MAL 2-0.5 % OP SOLN
OPHTHALMIC | Status: AC
  Filled 2018-03-31: qty 10

## 2018-03-31 MED ORDER — SODIUM CHLORIDE 0.9 % IV SOLN
INTRAVENOUS | Status: DC
  Administered 2018-03-31 (×2): via INTRAVENOUS

## 2018-03-31 MED ORDER — STERILE WATER FOR IRRIGATION IR SOLN
Status: DC | PRN
  Administered 2018-03-31: 400 mL

## 2018-03-31 MED ORDER — TRIAMCINOLONE ACETONIDE 40 MG/ML IJ SUSP
INTRAMUSCULAR | Status: AC
  Filled 2018-03-31: qty 5

## 2018-03-31 MED ORDER — LACTATED RINGERS IV SOLN: INTRAVENOUS | Status: DC

## 2018-03-31 MED ORDER — ATROPINE SULFATE 1 % OP SOLN
OPHTHALMIC | Status: AC
  Filled 2018-03-31: qty 5

## 2018-03-31 MED ORDER — PHENYLEPHRINE 40 MCG/ML (10ML) SYRINGE FOR IV PUSH (FOR BLOOD PRESSURE SUPPORT)
PREFILLED_SYRINGE | INTRAVENOUS | Status: DC | PRN
  Administered 2018-03-31 (×2): 80 ug via INTRAVENOUS

## 2018-03-31 MED ORDER — NA CHONDROIT SULF-NA HYALURON 40-30 MG/ML IO SOLN
INTRAOCULAR | Status: DC | PRN
  Administered 2018-03-31: 0.5 mL via INTRAOCULAR

## 2018-03-31 MED ORDER — EPHEDRINE SULFATE-NACL 50-0.9 MG/10ML-% IV SOSY
PREFILLED_SYRINGE | INTRAVENOUS | Status: DC | PRN
  Administered 2018-03-31 (×2): 10 mg via INTRAVENOUS

## 2018-03-31 MED ORDER — HYDROCODONE-ACETAMINOPHEN 5-325 MG PO TABS: 1.0000 | ORAL_TABLET | ORAL | 0 refills | Status: AC | PRN

## 2018-03-31 MED ORDER — PROPARACAINE HCL 0.5 % OP SOLN
1.0000 [drp] | OPHTHALMIC | Status: AC | PRN
  Administered 2018-03-31 (×3): 1 [drp] via OPHTHALMIC
  Filled 2018-03-31: qty 15

## 2018-03-31 MED ORDER — GLYCOPYRROLATE PF 0.2 MG/ML IJ SOSY
PREFILLED_SYRINGE | INTRAMUSCULAR | Status: AC
  Filled 2018-03-31: qty 1

## 2018-03-31 MED ORDER — GLYCOPYRROLATE PF 0.2 MG/ML IJ SOSY
PREFILLED_SYRINGE | INTRAMUSCULAR | Status: DC | PRN
  Administered 2018-03-31: .1 mg via INTRAVENOUS

## 2018-03-31 MED ORDER — BACITRACIN-POLYMYXIN B 500-10000 UNIT/GM OP OINT
TOPICAL_OINTMENT | OPHTHALMIC | Status: DC | PRN
  Administered 2018-03-31: 1 via OPHTHALMIC

## 2018-03-31 MED ORDER — LIDOCAINE HCL 1 % IJ SOLN
INTRAMUSCULAR | Status: AC
  Filled 2018-03-31: qty 20

## 2018-03-31 MED ORDER — OXYCODONE HCL 5 MG PO TABS
5.0000 mg | ORAL_TABLET | Freq: Once | ORAL | Status: AC | PRN
  Administered 2018-03-31: 5 mg via ORAL

## 2018-03-31 MED ORDER — BSS PLUS IO SOLN
INTRAOCULAR | Status: AC
  Filled 2018-03-31: qty 500

## 2018-03-31 MED ORDER — SODIUM CHLORIDE 0.9 % IV SOLN
INTRAVENOUS | Status: DC | PRN
  Administered 2018-03-31: 25 ug/min via INTRAVENOUS

## 2018-03-31 MED ORDER — GATIFLOXACIN 0.5 % OP SOLN
OPHTHALMIC | Status: DC | PRN
  Administered 2018-03-31: 1 [drp] via OPHTHALMIC

## 2018-03-31 MED ORDER — EPINEPHRINE PF 1 MG/ML IJ SOLN
INTRAOCULAR | Status: DC | PRN
  Administered 2018-03-31: 500 mL

## 2018-03-31 MED ORDER — LIDOCAINE 2% (20 MG/ML) 5 ML SYRINGE
INTRAMUSCULAR | Status: DC | PRN
  Administered 2018-03-31: 60 mg via INTRAVENOUS

## 2018-03-31 MED ORDER — BUPIVACAINE-EPINEPHRINE (PF) 0.25% -1:200000 IJ SOLN
INTRAMUSCULAR | Status: AC
  Filled 2018-03-31: qty 30

## 2018-03-31 SURGICAL SUPPLY — 90 items
APL SWBSTK 6 STRL LF DISP (MISCELLANEOUS) ×4
APPLICATOR COTTON TIP 6 STRL (MISCELLANEOUS) ×4 IMPLANT
APPLICATOR COTTON TIP 6IN STRL (MISCELLANEOUS) ×12
BANDAGE EYE OVAL (MISCELLANEOUS) IMPLANT
BETADINE 5% OPHTHALMIC (OPHTHALMIC) ×2 IMPLANT
BLADE EYE CATARACT 19 1.4 BEAV (BLADE) IMPLANT
BLADE MVR KNIFE 19G (BLADE) IMPLANT
BNDG EYE OVAL (GAUZE/BANDAGES/DRESSINGS) ×2 IMPLANT
CABLE BIPOLOR RESECTION CORD (MISCELLANEOUS) ×2 IMPLANT
CANNULA DUAL BORE 23G (CANNULA) IMPLANT
CANNULA DUALBORE 25G (CANNULA) ×2 IMPLANT
CANNULA FLEX TIP 25G (CANNULA) ×3 IMPLANT
CANNULA VLV SOFT TIP 25G (OPHTHALMIC) IMPLANT
CANNULA VLV SOFT TIP 25GA (OPHTHALMIC) IMPLANT
CLOSURE STERI-STRIP 1/2X4 (GAUZE/BANDAGES/DRESSINGS) ×1
CLSR STERI-STRIP ANTIMIC 1/2X4 (GAUZE/BANDAGES/DRESSINGS) ×1 IMPLANT
COVER SURGICAL LIGHT HANDLE (MISCELLANEOUS) ×3 IMPLANT
COVER WAND RF STERILE (DRAPES) ×1 IMPLANT
DRAPE MICROSCOPE LEICA 46X105 (MISCELLANEOUS) ×3 IMPLANT
DRAPE OPHTHALMIC 77X100 STRL (CUSTOM PROCEDURE TRAY) ×2 IMPLANT
ERASER HMR WETFIELD 23G BP (MISCELLANEOUS) IMPLANT
FILTER BLUE MILLIPORE (MISCELLANEOUS) IMPLANT
FILTER STRAW FLUID ASPIR (MISCELLANEOUS) IMPLANT
FORCEPS GRIESHABER ILM 25G A (INSTRUMENTS) IMPLANT
GAS AUTO FILL CONSTEL (OPHTHALMIC) ×3
GAS AUTO FILL CONSTELLATION (OPHTHALMIC) IMPLANT
GAS OPHTHALMIC (MISCELLANEOUS) ×2 IMPLANT
GLOVE BIO SURGEON STRL SZ7.5 (GLOVE) ×2 IMPLANT
GLOVE BIOGEL M 7.0 STRL (GLOVE) ×1 IMPLANT
GLOVE BIOGEL PI IND STRL 7.0 (GLOVE) IMPLANT
GLOVE BIOGEL PI INDICATOR 7.0 (GLOVE) ×2
GOWN STRL REUS W/ TWL LRG LVL3 (GOWN DISPOSABLE) ×2 IMPLANT
GOWN STRL REUS W/ TWL XL LVL3 (GOWN DISPOSABLE) ×1 IMPLANT
GOWN STRL REUS W/TWL LRG LVL3 (GOWN DISPOSABLE) ×9
GOWN STRL REUS W/TWL XL LVL3 (GOWN DISPOSABLE)
KIT BASIN OR (CUSTOM PROCEDURE TRAY) ×1 IMPLANT
KIT PERFLUORON PROCEDURE 5ML (MISCELLANEOUS) IMPLANT
KNIFE CRESCENT 1.75 EDGEAHEAD (BLADE) IMPLANT
KNIFE GRIESHABER SHARP 2.5MM (MISCELLANEOUS) ×1 IMPLANT
LENS BIOM SUPER VIEW SET DISP (OPHTHALMIC RELATED) IMPLANT
LENS VITRECTOMY FLAT OCLR DISP (MISCELLANEOUS) IMPLANT
MICROPICK 25G (MISCELLANEOUS)
NDL 18GX1X1/2 (RX/OR ONLY) (NEEDLE) ×1 IMPLANT
NDL 25GX 5/8IN NON SAFETY (NEEDLE) ×4 IMPLANT
NDL FILTER BLUNT 18X1 1/2 (NEEDLE) IMPLANT
NDL HYPO 25X5/8 SAFETYGLIDE (NEEDLE) IMPLANT
NDL HYPO 30X.5 LL (NEEDLE) ×2 IMPLANT
NEEDLE 18GX1X1/2 (RX/OR ONLY) (NEEDLE) ×3 IMPLANT
NEEDLE 25GX 5/8IN NON SAFETY (NEEDLE) ×3 IMPLANT
NEEDLE FILTER BLUNT 18X 1/2SAF (NEEDLE) ×2
NEEDLE FILTER BLUNT 18X1 1/2 (NEEDLE) ×1 IMPLANT
NEEDLE HYPO 25X5/8 SAFETYGLIDE (NEEDLE) ×3 IMPLANT
NEEDLE HYPO 30X.5 LL (NEEDLE) IMPLANT
NS IRRIG 1000ML POUR BTL (IV SOLUTION) ×1 IMPLANT
OPHTHALMIC BETADINE 5% (OPHTHALMIC)
PACK VITRECTOMY CUSTOM (CUSTOM PROCEDURE TRAY) ×3 IMPLANT
PAD ARMBOARD 7.5X6 YLW CONV (MISCELLANEOUS) ×2 IMPLANT
PAK PIK VITRECTOMY CVS 25GA (OPHTHALMIC) ×1 IMPLANT
PIC ILLUMINATED 25G (OPHTHALMIC)
PICK MICROPICK 25G (MISCELLANEOUS) IMPLANT
PIK ILLUMINATED 25G (OPHTHALMIC) IMPLANT
PROBE DIATHERMY DSP 27GA (MISCELLANEOUS) IMPLANT
PROBE ENDO DIATHERMY 25G (MISCELLANEOUS) ×2 IMPLANT
PROBE LASER ILLUM FLEX CVD 25G (OPHTHALMIC) ×2 IMPLANT
REPL STRA BRUSH NDL (NEEDLE) IMPLANT
REPL STRA BRUSH NEEDLE (NEEDLE) IMPLANT
RESERVOIR BACK FLUSH (MISCELLANEOUS) IMPLANT
SCRAPER DIAMOND 25GA (OPHTHALMIC RELATED) IMPLANT
SHIELD EYE LENSE ONLY DISP (GAUZE/BANDAGES/DRESSINGS) ×2 IMPLANT
SLEEVE SCLERAL BUCK TYPE 70 (Ophthalmic Related) ×3 IMPLANT
SUT ETHILON 5.0 S-24 (SUTURE) ×3 IMPLANT
SUT ETHILON 9 0 TG140 8 (SUTURE) IMPLANT
SUT SILK 2 0 (SUTURE)
SUT SILK 2 0 TIES 17X18 (SUTURE)
SUT SILK 2-0 18XBRD TIE 12 (SUTURE) ×1 IMPLANT
SUT SILK 2-0 18XBRD TIE BLK (SUTURE) ×1 IMPLANT
SUT VICRYL 7 0 TG140 8 (SUTURE) ×3 IMPLANT
SYR 10ML LL (SYRINGE) ×3 IMPLANT
SYR 20CC LL (SYRINGE) ×1 IMPLANT
SYR 5ML LL (SYRINGE) ×1 IMPLANT
SYR BULB 3OZ (MISCELLANEOUS) ×1 IMPLANT
SYR TB 1ML LUER SLIP (SYRINGE) ×3 IMPLANT
SYRINGE 20CC LL (MISCELLANEOUS) ×2 IMPLANT
TAPE SURG TRANSPORE 1 IN (GAUZE/BANDAGES/DRESSINGS) IMPLANT
TAPE SURGICAL TRANSPORE 1 IN (GAUZE/BANDAGES/DRESSINGS) ×2
TOWEL NATURAL 6PK STERILE (DISPOSABLE) ×1 IMPLANT
TRAY FOLEY CATH 14FR (SET/KITS/TRAYS/PACK) ×3 IMPLANT
TRAY FOLEY W/BAG SLVR 14FR (SET/KITS/TRAYS/PACK) ×2 IMPLANT
TUBING HIGH PRESS EXTEN 6IN (TUBING) IMPLANT
WATER STERILE IRR 1000ML POUR (IV SOLUTION) ×1 IMPLANT

## 2018-03-31 NOTE — Discharge Instructions (Addendum)
POSTOPERATIVE INSTRUCTIONS  Your doctor has performed vitreoretinal surgery on you at [facility]. [place] eye patched and shielded until seen by Dr. Coralyn Pear 9 AM tomorrow in clinic - Do not use drops until return - [place] - Sleep with belly down or on left side, avoid laying flat on back.    - No strenuous bending, stooping or lifting.  - You may not drive until further notice.  - If your doctor used a gas bubble in your eye during the procedure he will advise you on postoperative positioning. If you have a gas bubble you will be wearing a green bracelet that was applied in the operating room. The green bracelet should stay on as long as the gas bubble is in your eye. While the gas bubble is present you should not fly in an airplane. If you require general anesthesia while the gas bubble is present you must notify your anesthesiologist that an intraocular gas bubble is present so he can take the appropriate precautions.  - Tylenol or any other over-the-counter pain reliever can be used according to your doctor. If more pain medicine is required, your doctor will have a prescription for you.  - You may read, go up and down stairs, and watch television.     Bernarda Caffey, M.D., Ph.D.

## 2018-03-31 NOTE — Interval H&P Note (Signed)
History and Physical Interval Note:  03/31/2018 11:15 AM  [name]  has presented today for surgery, with the diagnosis of Retinal Detachment Right Eye  The various methods of treatment have been discussed with the patient and family. After consideration of risks, benefits and other options for treatment, the patient has consented to  Procedure(s): VITRECTOMY 25 GAUGE WITH SCLERAL BUCKLE (Right) as a surgical intervention .  The patient's history has been reviewed, patient examined, no change in status, stable for surgery.  I have reviewed the patient's chart and labs.  Questions were answered to the patient's satisfaction.     Bernarda Caffey

## 2018-03-31 NOTE — Op Note (Signed)
Date of procedure: 1.9.2020   Surgeon: Bernarda Caffey, MD, PhD   Assistant: Ernest Mallick, OA   Pre-operative Diagnosis: Macula involving Rhegmatogenous Retinal Detachment, Right Eye   Post-operative diagnosis: Macula involving Rhegmatogenous Retinal Detachment, Right Eye   Anesthesia: GETA   Procedures: 1)     Scleral Buckle, Right Eye 2)     25 gauge pars plana vitrectomy, Right Eye CPT [phone] 3)     Perfluorocarbon injection 4)     Fluid-air exchange, Right Eye 5)     Endolaser, Right Eye 5)     Injection of 60% A5W0 gas   Complications: none Estimated blood loss: minimal Specimens: none   Brief history:   The patient has a history of decreased vision in the affected right eye, and on examination, was noted to have a macula-involving retinal detachment, affecting activities of daily living.  The risks, benefits, and alternatives were explained to the patient, including pain, bleeding, infection, loss of vision, double vision, droopy eyelids, and need for more surgeries.  Informed consent was obtained from the patient and placed in the chart.     Procedure:             The patient was brought to the preoperative holding area where the correct eye was confirmed and marked. The patient was then brought to the operating room where general endotracheal anesthesia was induced. A secondary time-out was performed to identify the correct patient, eye, procedures, and any allergies. The right eye was prepped and draped in the usual sterile ophthalmic fashion followed by placement of a lid speculum.             A 360 conjunctival peritomy was created using [place] scissors and 0.12 forceps. Each of the four quadrants between the rectus muscles was dissected using Stevens scissors to detach Tenon's attachments from the globe. Each of the four rectus muscles was isolated on a muscle hook and slung using 2-0 Silk suture in the usual standard fashion. Each of the four quadrants between the rectus  muscles was inspected and there were noted to be no areas of scleral thinning. A #98 silicone band was then brought onto the field and was threaded under each rectus muscle. The band was then loosely secured using a #70 Watzke sleeve in the superotemporal quadrant. The band was then sutured to the sclera in each quadrant using 5-0 nylon sutures passed partial thickness through the sclera in a horizontal mattress fashion. The scleral buckle was then tightened to the appropriate height with two locking needle drivers. Attention was then turned to the vitrectomy portion of the procedure.              A 25 gauge trocar was placed in the inferotemporal quadrant in a beveled fashion--3.5 mm in this pseudophakic patient. A 4 mm infusion cannula was placed through this trocar, and the infusion cannula was confirmed in the vitreous cavity with no incarceration of retina or choroid prior to turning it on. Two additional 25 gauge trocars were placed in the superonasal and superotemporal quadrants (2 and 10 oclock, respectively) in a similar beveled fashion. At this time, a standard three-port pars plana vitrectomy was performed using the light pipe, the cutter, and the BIOM viewing system. A thorough anterior, core and peripheral vitreous dissection was performed. A posterior vitreous detachment was confirmed over the optic nerve. There was a bullous temporal retinal detachment extending from previous cryo scars at 1030 to 0600 w/ macular involvement. The fovea was detached. There  was a small peripheral retinal tear within the detached retina at 1015.             Traction was removed from all retinal breaks. The breaks were trimmed using the cutter to smooth the edges. Perfluoron was injected to push the subretinal fluid anterior to the scleral buckle. The extrusion cannula was used to remove subretinal fluid through the 1015 retinal tear. There was some residual subretinal fluid inferiorly overlying the buckle. Thus a small  inferior retinotomy was created at 0730 over the buckle to drain the residual subretinal fluid. A complete fluid-air exchange was performed with a soft tip extrusion cannula over the 0730 retinotomy and the 1015 break, then posteriorly to remove the perfluoron. After completion of these maneuvers, the retina was flat over the macula and over the scleral buckle. Under air, endolaser was applied to all the breaks and over and posterior to the scleral buckle.             At this time, the buckle height was confirmed and the buckle was finalized by trimming the band ends. The superotemporal trocar was removed and sutured with 7-0 vicryl in an interrupted fashion.  A complete air to 14% C3F8 gas exchange was performed through the infusion cannula and vented through the superonasal trocar using the extrusion cannula. The superonasal trocar and infusion cannula and associated trocar were then removed and sutured with 7-0 vicryl in an interrupted fashion. Kefzol + polymixin irrigation was then used over the buckle. A subtenon's block containing 0.75% marcaine and 2% lidocaine was administered.              The conjunctiva was closed with 7-0 vicryl sutures. The eye's intraocular pressure was confirmed to be at a physiologic level by digital palpation. Subconjunctival injections of Antibiotic and kenalog were administered. The lid speculum and drapes were removed. Drops of an antibiotic, antihypertensives, and steroid were given. Copious antibiotic ointment was instilled into the eye. The eye was patched and shielded. The patient tolerated the procedure well without any intraoperative or immediate postoperative complications. The patient was taken to the recovery room in good condition. The patient was instructed to maintain a strict face-down position and will be seen by Dr. Coralyn Pear tomorrow morning in clinic.

## 2018-03-31 NOTE — Transfer of Care (Signed)
Immediate Anesthesia Transfer of Care Note  Patient: [name]  Procedure(s) Performed: RIGHT VITRECTOMY 25 GAUGE WITH SCLERAL BUCKLE (Right Eye) RIGHT EYE LASER PHOTO ABLATION (Right Eye) RIGHT EYE INSERTION OF GAS C3F8 (Right Eye)  Patient Location: PACU  Anesthesia Type:General  Level of Consciousness: awake, alert , oriented and patient cooperative  Airway & Oxygen Therapy: Patient Spontanous Breathing and Patient connected to nasal cannula oxygen  Post-op Assessment: Report given to RN and Post -op Vital signs reviewed and stable  Post vital signs: Reviewed and stable  Last Vitals:  Vitals Value Taken Time  BP 124/69 03/31/2018  3:37 PM  Temp 36.9 C 03/31/2018  3:37 PM  Pulse 86 03/31/2018  3:39 PM  Resp 14 03/31/2018  3:39 PM  SpO2 98 % 03/31/2018  3:39 PM  Vitals shown include unvalidated device data.  Last Pain:  Vitals:   03/31/18 1537  TempSrc:   PainSc: Asleep         Complications: No apparent anesthesia complications

## 2018-03-31 NOTE — Anesthesia Procedure Notes (Signed)
Procedure Name: Intubation Date/Time: 03/31/2018 11:58 AM Performed by: Colin Benton, CRNA Pre-anesthesia Checklist: Patient identified, Emergency Drugs available, Suction available and Patient being monitored Patient Re-evaluated:Patient Re-evaluated prior to induction Oxygen Delivery Method: Circle system utilized Preoxygenation: Pre-oxygenation with 100% oxygen Induction Type: IV induction Ventilation: Mask ventilation without difficulty Laryngoscope Size: Miller and 2 Grade View: Grade I Tube type: Oral Tube size: 7.0 mm Number of attempts: 1 Airway Equipment and Method: Stylet Placement Confirmation: ETT inserted through vocal cords under direct vision,  positive ETCO2 and breath sounds checked- equal and bilateral Secured at: 22 cm Tube secured with: Tape Dental Injury: Teeth and Oropharynx as per pre-operative assessment

## 2018-03-31 NOTE — Brief Op Note (Signed)
03/31/2018  3:40 PM  PATIENT:  [name]  73 y.o. female  PRE-OPERATIVE DIAGNOSIS:  Retinal Detachment Right Eye  POST-OPERATIVE DIAGNOSIS:  Retinal Detachment Right Eye  PROCEDURE:  Procedure(s): RIGHT VITRECTOMY 25 GAUGE WITH SCLERAL BUCKLE (Right) RIGHT EYE LASER PHOTO ABLATION (Right) RIGHT EYE INSERTION OF GAS C3F8 (Right)  SURGEON:  Surgeon(s) and Role:    Bernarda Caffey, MD - Primary  ASSISTANTS: Ernest Mallick, OA   ANESTHESIA:   local and general  EBL:  5 mL   BLOOD ADMINISTERED:none  DRAINS: none   LOCAL MEDICATIONS USED:  BUPIVICAINE , LIDOCAINE  and Amount: 8 ml  SPECIMEN:  No Specimen  DISPOSITION OF SPECIMEN:  N/A  COUNTS:  YES  TOURNIQUET:  * No tourniquets in log *  DICTATION: .Note written in EPIC  PLAN OF CARE: Discharge to home after PACU  PATIENT DISPOSITION:  PACU - hemodynamically stable.   Delay start of Pharmacological VTE agent (>24hrs) due to surgical blood loss or risk of bleeding: no

## 2018-04-01 ENCOUNTER — Ambulatory Visit (INDEPENDENT_AMBULATORY_CARE_PROVIDER_SITE_OTHER): Payer: Medicare Other | Admitting: Ophthalmology

## 2018-04-01 DIAGNOSIS — H353132 Nonexudative age-related macular degeneration, bilateral, intermediate dry stage: Secondary | ICD-10-CM

## 2018-04-01 DIAGNOSIS — H3321 Serous retinal detachment, right eye: Secondary | ICD-10-CM

## 2018-04-01 DIAGNOSIS — H25813 Combined forms of age-related cataract, bilateral: Secondary | ICD-10-CM

## 2018-04-01 DIAGNOSIS — H3581 Retinal edema: Secondary | ICD-10-CM

## 2018-04-01 NOTE — Anesthesia Postprocedure Evaluation (Signed)
Anesthesia Post Note  Patient: [name]  Procedure(s) Performed: RIGHT VITRECTOMY 25 GAUGE WITH SCLERAL BUCKLE (Right Eye) RIGHT EYE LASER PHOTO ABLATION (Right Eye) RIGHT EYE INSERTION OF GAS C3F8 (Right Eye)     Patient location during evaluation: PACU Anesthesia Type: General Level of consciousness: awake and alert Pain management: pain level controlled Vital Signs Assessment: post-procedure vital signs reviewed and stable Respiratory status: spontaneous breathing, nonlabored ventilation and respiratory function stable Cardiovascular status: blood pressure returned to baseline and stable Postop Assessment: no apparent nausea or vomiting Anesthetic complications: no    Last Vitals:  Vitals:   03/31/18 1559 03/31/18 1630  BP:  129/65  Pulse: 81 79  Resp: 11 (!) 21  Temp:    SpO2: 97% 97%    Last Pain:  Vitals:   03/31/18 1558  TempSrc:   PainSc: [name]

## 2018-04-03 ENCOUNTER — Encounter (INDEPENDENT_AMBULATORY_CARE_PROVIDER_SITE_OTHER): Payer: Self-pay | Admitting: Ophthalmology

## 2018-04-05 ENCOUNTER — Other Ambulatory Visit: Payer: Self-pay | Admitting: Family Medicine

## 2018-04-05 ENCOUNTER — Encounter (HOSPITAL_COMMUNITY): Payer: Self-pay | Admitting: Ophthalmology

## 2018-04-05 NOTE — Telephone Encounter (Signed)
Not due until January 21.

## 2018-04-05 NOTE — Telephone Encounter (Signed)
Last OV 12/24/17, No future OV  Last filled 01/11/18, #90 with 2 refills

## 2018-04-06 ENCOUNTER — Emergency Department (HOSPITAL_COMMUNITY)
Admission: EM | Admit: 2018-04-06 | Discharge: 2018-04-06 | Disposition: A | Discharge status: Home / Self Care | Payer: Medicare Other | Attending: Emergency Medicine | Admitting: Emergency Medicine

## 2018-04-06 ENCOUNTER — Emergency Department (HOSPITAL_COMMUNITY): Payer: Medicare Other

## 2018-04-06 ENCOUNTER — Encounter (HOSPITAL_COMMUNITY): Payer: Self-pay | Admitting: *Deleted

## 2018-04-06 ENCOUNTER — Other Ambulatory Visit: Payer: Self-pay | Admitting: Family Medicine

## 2018-04-06 DIAGNOSIS — I110 Hypertensive heart disease with heart failure: Secondary | ICD-10-CM | POA: Insufficient documentation

## 2018-04-06 DIAGNOSIS — Z87891 Personal history of nicotine dependence: Secondary | ICD-10-CM | POA: Diagnosis not present

## 2018-04-06 DIAGNOSIS — R531 Weakness: Secondary | ICD-10-CM

## 2018-04-06 DIAGNOSIS — I5032 Chronic diastolic (congestive) heart failure: Secondary | ICD-10-CM | POA: Diagnosis not present

## 2018-04-06 DIAGNOSIS — E860 Dehydration: Secondary | ICD-10-CM | POA: Diagnosis not present

## 2018-04-06 DIAGNOSIS — Z79899 Other long term (current) drug therapy: Secondary | ICD-10-CM | POA: Insufficient documentation

## 2018-04-06 DIAGNOSIS — R0602 Shortness of breath: Secondary | ICD-10-CM | POA: Diagnosis not present

## 2018-04-06 LAB — CBC
HCT: 47.3 % — ABNORMAL HIGH (ref 36.0–46.0)
Hemoglobin: 15.9 g/dL — ABNORMAL HIGH (ref 12.0–15.0)
MCH: 30.2 pg (ref 26.0–34.0)
MCHC: 33.6 g/dL (ref 30.0–36.0)
MCV: 89.9 fL (ref 80.0–100.0)
Platelets: 249 10*3/uL (ref 150–400)
RBC: 5.26 MIL/uL — ABNORMAL HIGH (ref 3.87–5.11)
RDW: 13.2 % (ref 11.5–15.5)
WBC: 11 10*3/uL — ABNORMAL HIGH (ref 4.0–10.5)
nRBC: 0 % (ref 0.0–0.2)

## 2018-04-06 LAB — BASIC METABOLIC PANEL
Anion gap: 13 (ref 5–15)
BUN: 14 mg/dL (ref 8–23)
CO2: 25 mmol/L (ref 22–32)
Calcium: 9.7 mg/dL (ref 8.9–10.3)
Chloride: 98 mmol/L (ref 98–111)
Creatinine, Ser: 1.36 mg/dL — ABNORMAL HIGH (ref 0.44–1.00)
GFR calc Af Amer: 44 mL/min — ABNORMAL LOW (ref >60)
GFR, EST NON AFRICAN AMERICAN: 38 mL/min — AB (ref >60)
Glucose, Bld: 137 mg/dL — ABNORMAL HIGH (ref 70–99)
Potassium: 4.3 mmol/L (ref 3.5–5.1)
Sodium: 136 mmol/L (ref 135–145)

## 2018-04-06 LAB — URINALYSIS, ROUTINE W REFLEX MICROSCOPIC
BILIRUBIN URINE: NEGATIVE
Glucose, UA: NEGATIVE mg/dL
Ketones, ur: 5 mg/dL — AB
Leukocytes, UA: NEGATIVE
Nitrite: NEGATIVE
Protein, ur: NEGATIVE mg/dL
SPECIFIC GRAVITY, URINE: 1.011 (ref 1.005–1.030)
pH: 6 (ref 5.0–8.0)

## 2018-04-06 LAB — I-STAT TROPONIN, ED: TROPONIN I, POC: 0.02 ng/mL (ref 0.00–0.08)

## 2018-04-06 MED ORDER — HYDROCODONE-ACETAMINOPHEN 5-325 MG PO TABS
1.0000 | ORAL_TABLET | Freq: Once | ORAL | Status: AC
  Administered 2018-04-06: 1 via ORAL
  Filled 2018-04-06: qty 1

## 2018-04-06 MED ORDER — ONDANSETRON HCL 4 MG PO TABS: 4.0000 mg | ORAL_TABLET | Freq: Three times a day (TID) | ORAL | 0 refills | Status: DC | PRN

## 2018-04-06 MED ORDER — SODIUM CHLORIDE 0.9 % IV BOLUS
1000.0000 mL | Freq: Once | INTRAVENOUS | Status: AC
  Administered 2018-04-06: 1000 mL via INTRAVENOUS

## 2018-04-06 NOTE — ED Provider Notes (Signed)
[facility] EMERGENCY DEPARTMENT Provider Note   CSN: [id number] Arrival date & time: 04/06/18  1212     History   Chief Complaint Chief Complaint  Patient presents with  . Weakness    HPI [name] is a 73 y.o. female with history significant for afib on Warfarin, GERD presenting to the emergency department today with chief complaint of weakness x 6 days. The weakness started after her surgery x6 days ago for right eye retinal detachment. She was instructed post op to sleep face down in a pillow. She reports when she wakes up she does not know if it is night or day. She thinks this is because she having to sleep on this pillow and is not able to lay as she normally does. Reports associated nausea, one episode of non bilious vomiting.  Pt also shortness of breath x 1 day. Her shortness of breath is aggravated with exertion. She does not have shortness of breath at rest. She denies pain associated with the shortness of breath.   She reports a near syncopal episode today and describes it as feeling weak and like she would pass out while walking to the car. She did not lose conscious. Denies chest pain, palpitations. She has not taken any medication for these symptoms prior to arrival.  History obtained through patient and her son.  Past Medical History:  Diagnosis Date  . ABDOMINAL PAIN RIGHT UPPER QUADRANT 11/08/2008  . ALLERGIC RHINITIS 08/01/2009  . Anxiety   . Complication of anesthesia 01/31/2015   ? doesn't remember anything after being taken to pre surgery  . Coronary artery calcification seen on CAT scan 05/30/2009   moderate risk coronary calcium score by chest CT  . DDD (degenerative disc disease), lumbar   . DEPRESSION 06/13/2008  . DYSLIPIDEMIA 02/27/2009  . DYSPNEA 02/27/2009  . Dysrhythmia    Afib  . Frequency of urination   . GERD (gastroesophageal reflux disease)    "years ago"  . Head injury 2016   did not loose consciousness  .  History of Bell's palsy   . History of DVT of lower extremity 2006-- CHRONIC COUMADIN THERAPY  . History of hiatal hernia   . History of Lyme disease   . 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 Hypertension   . NON-HODGKIN'S LYMPHOMA, HX OF dx  2005--  chemoradiation completed 2006--  no recurrence   onocologist- dr odogwu--   . OSTEOARTHRITIS, MODERATE 04/07/2010  . Permanent atrial fibrillation    On chronic anti-coagulation with warfarin  . Pneumonia    x 3 - last time 02/2018  . Retroperitoneal fibrosis   . Stroke [facility])    ? - [place]- "you have had 3 strokes"    Patient Active Problem List   Diagnosis Date Noted  . Chronic low back pain 01/16/2017  . Encounter for therapeutic drug monitoring 12/11/2016  . Acute encephalopathy 02/03/2015  . Hematoma of right parietal scalp 02/03/2015  . Anticoagulated on Coumadin 02/03/2015  . Anemia 02/03/2015  . Status post reverse total shoulder replacement 01/31/2015  . Chronic diastolic CHF (congestive heart failure) ([place]) 06/10/2014  . Anxiety state 04/23/2014  . Permanent atrial fibrillation 11/13/2013  . Mitral stenosis 11/13/2013  . Obesity (BMI 30-39.9) 11/30/2012  . Essential hypertension 11/03/2012  . Cardiomyopathy ([place]) 09/16/2011  . DVT of lower extremity (deep venous thrombosis) ([place]) 01/22/2011  . Osteoarthritis 04/07/2010  . ALLERGIC RHINITIS 08/01/2009  . Coronary artery calcification seen on CAT  scan 05/30/2009  . CARDIOVASCULAR FUNCTION STUDY, ABNORMAL 05/01/2009  . HLD (hyperlipidemia) 02/27/2009  . DYSPNEA 02/27/2009  . ABDOMINAL PAIN RIGHT UPPER QUADRANT 11/08/2008  . NHL (non-Hodgkin's lymphoma) ([place]) 11/08/2008  . DEPRESSION 06/13/2008  . FATIGUE 05/30/2008    Past Surgical History:  Procedure Laterality Date  . BREAST MASS EXCISION     BENIGN-- RIGHT BREAST  . CARDIOVASCULAR STRESS TEST  04-04-2009-- PER PT ASYMPTOMATIC-- MEDICAL MANAGEMENT   STUDY WAS EQUIVOCAL/ EF 49%/  GLOBAL HYPOKINESIS/ APPEARS TO BE A MILD ANTERIOR PERFUSION DEFECT COULD REPRESENT ISCHEMIA BUT DUE TO  ACTIVITY WORSE IN STRESS THAN AT REST POSS. THE DEFECT WAS ARTIFACTUAL  . CARDIOVERSION N/A 07/11/2015   Procedure: CARDIOVERSION;  Surgeon: Larey Dresser, MD;  Location: [facility];  Service: Cardiovascular;  Laterality: N/A;  . CT ANGIOGRAM  FEB 2011   CALCIUM SCORE 161/ POSS. MODERATE MID LAD STENOSIS  . CYSTOSCOPY W/ RETROGRADES  04/28/2011   Procedure: CYSTOSCOPY WITH RETROGRADE PYELOGRAM;  Surgeon: Fredricka Bonine, MD;  Location: [facility];  Service: Urology;  Laterality: Left;  . CYSTOSCOPY W/ URETERAL STENT REMOVAL  04/28/2011   Procedure: CYSTOSCOPY WITH STENT REMOVAL;  Surgeon: Fredricka Bonine, MD;  Location: [facility];  Service: Urology;;  . EXPLORATORY LAPAROTOMY  2005   LYMPHOMA  . EYE SURGERY Bilateral    cataract   . GAS INSERTION Right 03/31/2018   Procedure: RIGHT EYE INSERTION OF GAS C3F8;  Surgeon: Bernarda Caffey, MD;  Location: [facility];  Service: Ophthalmology;  Laterality: Right;  . KNEE ARTHROSCOPY  2005   RIGHT  . LASER PHOTO ABLATION Right 03/31/2018   Procedure: RIGHT EYE LASER PHOTO ABLATION;  Surgeon: Bernarda Caffey, MD;  Location: [facility];  Service: Ophthalmology;  Laterality: Right;  . MULTIPLE CYSTO/ LEFT URETERAL STENT EXCHANGES  LAST ONE 10-28-10  . RETROPERITONEAL BX  2007  . REVERSE SHOULDER ARTHROPLASTY Right 01/31/2015   Procedure: RIGHT REVERSE SHOULDER ARTHROPLASTY;  Surgeon: Justice Britain, MD;  Location: [facility];  Service: Orthopedics;  Laterality: Right;  . TONSILLECTOMY  CHILD  . TOTAL KNEE ARTHROPLASTY  OCT 2010   RIGHT  . TOTAL KNEE ARTHROPLASTY  JAN 2010   LEFT  . TRANSTHORACIC ECHOCARDIOGRAM  [id number]   MILD SYSTOLIC DYSFUNCTION/ EF 67-20%/ MODERATE DIASTOLIC DYSFUCTION/ MILD MR/ MILD BIATRIAL ENLARGEMENT  . VITRECTOMY 25 GAUGE WITH SCLERAL BUCKLE Right 03/31/2018   Procedure: RIGHT VITRECTOMY 25 GAUGE  WITH SCLERAL BUCKLE;  Surgeon: Bernarda Caffey, MD;  Location: [facility];  Service: Ophthalmology;  Laterality: Right;     OB History   No obstetric history on file.      Home Medications    Prior to Admission medications   Medication Sig Start Date End Date Taking? Authorizing Provider  ALPRAZolam (XANAX) 1 MG tablet TAKE 1 TABLET BY MOUTH THREE TIMES A DAY AS NEEDED Patient taking differently: Take 1 mg by mouth 3 (three) times daily as needed for anxiety.  01/11/18  Yes Burchette, Alinda Sierras, MD  atropine 1 % ophthalmic ointment Place 1 application into the right eye 4 (four) times daily.   Yes [provider]  b complex vitamins capsule Take 2 capsules by mouth daily.    Yes [provider]  Biotin 5 MG CAPS Take 1 capsule by mouth daily.    Yes [provider]  buPROPion (WELLBUTRIN XL) 300 MG 24 hr tablet TAKE 1 TABLET BY MOUTH EVERY DAY Patient taking differently: Take 300 mg by mouth daily.  04/05/18  Yes Burchette, Alinda Sierras, MD  Cholecalciferol (VITAMIN D) 2000 UNITS CAPS Take 2,000 Int'l Units by mouth.   Yes [provider]  Coenzyme Q10-Fish Oil-Vit E (CO-Q 10 OMEGA-3 FISH OIL PO) Take 100 mg by mouth daily.    Yes [provider]  diltiazem (CARDIZEM CD) 180 MG 24 hr capsule Take 1 capsule (180 mg total) by mouth daily. 01/25/18 01/25/19 Yes Turner, Eber Hong, MD  dorzolamide-timolol (COSOPT) 22.3-6.8 MG/ML ophthalmic solution Place 1 drop into the right eye 4 (four) times daily.   Yes [provider]  DULoxetine (CYMBALTA) 60 MG capsule TAKE 1 CAPSULE BY MOUTH EVERY DAY Patient taking differently: Take 60 mg by mouth daily.  02/15/18  Yes Burchette, Alinda Sierras, MD  fluticasone (FLONASE) 50 MCG/ACT nasal spray instill 2 sprays into each nostril once daily Patient taking differently: Place 2 sprays into both nostrils daily as needed for allergies.  03/15/13  Yes Burchette, Alinda Sierras, MD  furosemide (LASIX) 20 MG tablet TAKE 1 TABLET (20 MG  TOTAL) BY MOUTH DAILY. PLEASE KEEP UPCOMING APPOINTMENT FOR FURTHER REFILLS 01/20/18  Yes Burchette, Alinda Sierras, MD  gatifloxacin (ZYMAXID) 0.5 % SOLN Place 1 drop into the right eye 4 (four) times daily.   Yes [provider]  HYDROcodone-acetaminophen (NORCO/VICODIN) 5-325 MG tablet Take 1 tablet by mouth every 4 (four) hours as needed for moderate pain. 03/31/18 03/31/19 Yes Bernarda Caffey, MD  LIVALO 2 MG TABS TAKE 1 TABLET BY MOUTH EVERY DAY Patient taking differently: Take 2 mg by mouth daily.  02/25/18  Yes Turner, Eber Hong, MD  loratadine (CLARITIN) 10 MG tablet Take 10 mg by mouth daily.    Yes [provider]  losartan (COZAAR) 25 MG tablet TAKE 1 TABLET EVERY DAY Patient taking differently: Take 25 mg by mouth daily.  03/07/18  Yes Turner, Traci R, MD  Lysine 500 MG TABS Take 500 mg by mouth daily.   Yes [provider]  metoprolol succinate (TOPROL-XL) 25 MG 24 hr tablet TAKE 1 TABLET EVERY DAY Patient taking differently: Take 25 mg by mouth daily.  11/09/17  Yes Turner, Eber Hong, MD  nitroGLYCERIN (NITROSTAT) 0.4 MG SL tablet place 1 tablet under the tongue every 5 minutes for UP TO 3 doses if needed 07/20/16  Yes Larey Dresser, MD  prednisoLONE acetate (PRED FORTE) 1 % ophthalmic suspension Place 1 drop into the right eye 4 (four) times daily.   Yes [provider]  valACYclovir (VALTREX) 1000 MG tablet Take 2 tablets at onset of cold sores and repeat 2 in 12 hours. Patient taking differently: Take 2,000 mg by mouth See admin instructions. Take 2 tablets at onset of cold sores and repeat 2 in 12 hours. 04/16/17  Yes Burchette, Alinda Sierras, MD  warfarin (COUMADIN) 5 MG tablet TAKE 1/2 TO 1 TABLET DAILY AS DIRECTED BY COUMADIN CLINIC. Patient taking differently: Take 2.5-5 mg by mouth See admin instructions. 2.5mg  Sunday and Tuesday 5mg  all other days. 03/29/18  Yes Turner, Eber Hong, MD  ondansetron (ZOFRAN) 4 MG tablet Take 1 tablet (4 mg total) by mouth every 8  (eight) hours as needed for nausea or vomiting. 04/06/18   Bowe Sidor, Harley Hallmark, PA-C  amiodarone (PACERONE) 200 MG tablet Take 1 tablet (200 mg total) by mouth daily. 01/16/15 07/24/15  Larey Dresser, MD    Family History Family History  Problem Relation Age of Onset  . Arthritis Mother   . Heart disease Mother   .  Heart attack Mother   . Diabetes Sister   . Hypertension Sister   . Stroke Sister   . Heart attack Sister     Social History Social History   Tobacco Use  . Smoking status: Former Smoker    Packs/day: 2.00    Years: 52.00    Pack years: 104.00    Types: Cigarettes    Last attempt to quit: 03/24/2008    Years since quitting: 10.0  . Smokeless tobacco: Never Used  Substance Use Topics  . Alcohol use: Not Currently    Comment: RARE  . Drug use: No     Allergies   Chlorhexidine base and Penicillins   Review of Systems Review of Systems  Constitutional: Positive for appetite change. Negative for chills and fever.  HENT: Negative for congestion, sinus pressure and sore throat.   Eyes: Positive for pain (right eye).  Respiratory: Negative for chest tightness and shortness of breath.   Cardiovascular: Negative for chest pain and palpitations.  Gastrointestinal: Positive for nausea and vomiting. Negative for abdominal pain and diarrhea.  Genitourinary: Negative for difficulty urinating, dysuria, flank pain, frequency and hematuria.  Musculoskeletal: Negative for arthralgias and neck pain.  Skin: Positive for color change. Negative for rash and wound.       Ecchymosis on right breast present since surgery  Neurological: Positive for weakness. Negative for dizziness, seizures, syncope, speech difficulty, light-headedness and headaches.       Pt is wearing post op eye shield on right eye. Speech is clear and goal oriented, follows commands CN III-XII intact, no facial droop Normal strength in upper and lower extremities bilaterally including dorsiflexion and  plantar flexion, strong and equal grip strength Sensation normal to light touch Moves extremities without ataxia, coordination intact Normal finger to nose and rapid alternating movements Normal gait and balance   Psychiatric/Behavioral: Positive for confusion.     Physical Exam Updated Vital Signs BP (!) 156/90   Pulse 85   Temp 97.9 F (36.6 C) (Oral)   SpO2 100%   Physical Exam Vitals signs and nursing note reviewed.  Constitutional:      Appearance: She is not ill-appearing or toxic-appearing.  HENT:     Head: Normocephalic and atraumatic.     Nose: Nose normal.     Mouth/Throat:     Mouth: Mucous membranes are moist.     Pharynx: Oropharynx is clear.  Eyes:     Extraocular Movements: Extraocular movements intact.     Conjunctiva/sclera: Conjunctivae normal.     Comments: Unable to test right eye because wearing post op shield. Left eye EOM intact  Neck:     Musculoskeletal: Normal range of motion and neck supple.  Cardiovascular:     Rate and Rhythm: Normal rate. Rhythm irregular.     Pulses: Normal pulses.     Heart sounds: Normal heart sounds.  Pulmonary:     Effort: Pulmonary effort is normal.     Breath sounds: Normal breath sounds.  Abdominal:     General: Bowel sounds are normal. There is no distension.     Palpations: Abdomen is soft.     Tenderness: There is no abdominal tenderness. There is no guarding or rebound.  Musculoskeletal: Normal range of motion.  Skin:    General: Skin is warm and dry.     Capillary Refill: Capillary refill takes less than 2 seconds.  Neurological:     Mental Status: She is alert and oriented to person, place, and time.  Motor: No weakness.     Comments: Speech is clear and goal oriented, follows commands CN III-XII intact testing left eye only, no facial droop Normal strength in upper and lower extremities bilaterally including dorsiflexion and plantar flexion, strong and equal grip strength Sensation normal to light  and sharp touch Moves extremities without ataxia, coordination intact Normal finger to nose and rapid alternating movements Normal gait and balance  Psychiatric:        Mood and Affect: Mood normal.        Behavior: Behavior normal.        Judgment: Judgment normal.      ED Treatments / Results  Labs (all labs ordered are listed, but only abnormal results are displayed) Labs Reviewed  BASIC METABOLIC PANEL - Abnormal; Notable for the following components:      Result Value   Glucose, Bld 137 (*)    Creatinine, Ser 1.36 (*)    GFR calc non Af Amer 38 (*)    GFR calc Af Amer 44 (*)    All other components within normal limits  CBC - Abnormal; Notable for the following components:   WBC 11.0 (*)    RBC 5.26 (*)    Hemoglobin 15.9 (*)    HCT 47.3 (*)    All other components within normal limits  URINALYSIS, ROUTINE W REFLEX MICROSCOPIC - Abnormal; Notable for the following components:   APPearance HAZY (*)    Hgb urine dipstick SMALL (*)    Ketones, ur 5 (*)    Bacteria, UA RARE (*)    All other components within normal limits  I-STAT TROPONIN, ED    EKG EKG Interpretation  Date/Time:  Wednesday April 06 2018 13:08:00 EST Ventricular Rate:  92 PR Interval:    QRS Duration: 110 QT Interval:  384 QTC Calculation: 474 R Axis:   63 Text Interpretation:  Atrial fibrillation Nonspecific ST abnormality Abnormal ECG No significant change since last tracing Confirmed by Isla Pence [phone]) on 04/06/2018 1:36:12 PM   Radiology Dg Chest 2 View  Result Date: 04/06/2018 CLINICAL DATA:  Generalized fatigue and shortness of breath since RIGHT eye ophthalmological surgery 03/31/2018. EXAM: CHEST - 2 VIEW COMPARISON:  02/28/2018 and earlier. FINDINGS: Cardiac silhouette normal in size, unchanged. Thoracic aorta atherosclerotic, unchanged. Hilar and mediastinal contours otherwise unremarkable. Lungs clear. Bronchovascular markings normal. Pulmonary vascularity normal. No visible  pleural effusions. No pneumothorax. Degenerative changes involving the thoracic spine, thoracolumbar dextroscoliosis, degenerative changes in the LEFT shoulder joint with calcified loose bodies, and prior RIGHT shoulder arthroplasty with anatomic alignment IMPRESSION: No acute cardiopulmonary disease. Electronically Signed   By: Evangeline Dakin M.D.   On: 04/06/2018 13:34    Procedures Procedures (including critical care time)  Medications Ordered in ED Medications  sodium chloride 0.9 % bolus 1,000 mL (0 mLs Intravenous Stopped 04/06/18 1820)  HYDROcodone-acetaminophen (NORCO/VICODIN) 5-325 MG per tablet 1 tablet (1 tablet Oral Given 04/06/18 1638)     Initial Impression / Assessment and Plan / ED Course  I have reviewed the triage vital signs and the nursing notes.  Pertinent labs & imaging results that were available during my care of the patient were reviewed by me and considered in my medical decision making (see chart for details).    Pt is well appearing and is alert and oriented x3. She has not had a syncopal episode, she describes it as feeling weak. Given she has not eaten regularly over the last 6 days will check CBC, BMP, troponin.  Will check UA to for signs of urinary tract infection.   Chest xray is negative for acute cardiopulmonary disease. EKG with afib, unchanged from prior EKGs. Troponin, UA unremarkable. CBC shows elevated RBC, hemoglobin, and HCT suggesting she is heme concentrated from her dehydration. BMP shows Creatinine elevated to 1.36 which is not too far from her baseline. Pt rehydrated with 1L NS as well as PO fluids. Also tolerating food without nausea. Recommended for Creatinine recheck with pcp during follow up.   Overall pt's symptoms,history, and labs are suggestive of dehydration.  Discussed strict ED return precautions with pt and her son. Pt verbalized understanding of and is in agreement with this plan. Pt stable for discharge home at this time.  The  patient was discussed with and seen by Dr. Gilford Raid who agrees with the treatment plan.    Final Clinical Impressions(s) / ED Diagnoses   Final diagnoses:  Dehydration  Weakness    ED Discharge Orders         Ordered    ondansetron (ZOFRAN) 4 MG tablet  Every 8 hours PRN     04/06/18 1758           Cherre Robins, PA-C 04/07/18 0057    Isla Pence, MD 04/07/18 [phone]

## 2018-04-06 NOTE — ED Notes (Signed)
Pt states that she doesn't have a pacemaker

## 2018-04-06 NOTE — ED Notes (Signed)
Pt currently in Xray. Pt will be coming back after Xray is Complete

## 2018-04-06 NOTE — ED Triage Notes (Signed)
Pt in c/o weakness at home and intermittent syncopal episode since retina surgery last Thursday, pt reports new onset SOB last night, with concern for Afib, pt takes Cardiazem, pt reports n/v today, pt answers all questions appropriately, pt currently taking Warfarin

## 2018-04-06 NOTE — Discharge Instructions (Addendum)
You have been seen today for weakness. Your lab work needs to be rechecked by your primary care doctor when you follow up. Your creatinine was slightly elevated today. Please read and follow all provided instructions.   1. Medications: Prescription sent to your pharmacy for Zofran for your nausea- please take as prescribed. Continue usual home medications 2. Treatment: rest, drink plenty of fluids. Do your best to eat more than you have been. Your weakness is likely from lack of food and fluid intake.  3. Follow Up: Please follow up with your primary doctor in 2-5 days for discussion of your diagnoses and further evaluation after today's visit; Please return to the ER for any new or worsening symptoms. Please obtain all of your results from medical records or have your doctors office obtain the results - share them with your doctor - you should be seen at your doctors office. Call today to arrange your follow up.   Take medications as prescribed. Please review all of the medicines and only take them if you do not have an allergy to them. Return to the emergency room for worsening condition or new concerning symptoms. Follow up with your regular doctor. If you don't have a regular doctor use one of the numbers below to establish a primary care doctor.  ?  It is also a possibility that you have an allergic reaction to any of the medicines that you have been prescribed - Everybody reacts differently to medications and while MOST people have no trouble with most medicines, you may have a reaction such as nausea, vomiting, rash, swelling, shortness of breath. If this is the case, please stop taking the medicine immediately and contact your physician.  ?  You should return to the ER if you develop severe or worsening symptoms.

## 2018-04-06 NOTE — ED Notes (Signed)
Patient verbalizes understanding of discharge instructions. Opportunity for questioning and answers were provided. 

## 2018-04-07 NOTE — Telephone Encounter (Signed)
Pt's son stated pt has 1 pill left and would like to know if refill can be done today. Pt has [facility] F/U scheduled for tomorrow 04/08/18. Please advise.

## 2018-04-07 NOTE — Progress Notes (Signed)
CHIEF COMPLAINT Patient presents for Post-op Follow-up   HISTORY OF PRESENT ILLNESS: [name] is a 73 y.o. female who presents to the clinic today for:   HPI    Post-op Follow-up    In right eye.  Discomfort includes pain and itching.  Negative for foreign body sensation, tearing, discharge and floaters.  Vision is stable.  I, the attending physician,  performed the HPI with the patient and updated documentation appropriately.          Comments    POV #2 S/P 25 g [place] OD 03/31/18. Patient accompanied by son today.  Patient states she is feeling better since hospital visit (04/06/18), she has increased her fluids and is eating better. Pt states she has occasional pinching pain rates 4, Vicodin relieves and she is sleeping a lot after taking it. Pt can see images, light and count fingers _0 '. Pt is using gtt's per son ,he is administering the gtt's as instructed. Patient is continuing head down position 90% of the time per son.        Last edited by Bernarda Caffey, MD on 04/08/2018 10:26 AM. (History)    pt is maintaining head down position as instructed  Referring physician: [name], MD [place], [place] [address]  HISTORICAL INFORMATION:   Selected notes from the MEDICAL RECORD NUMBER Referred by Dr. Delman Cheadle for concern of mac off RD LEE- 02.04.19  Ocular Hx-  PMH- A-Fib on coumadin, severe anxiety, CAD, depression    CURRENT MEDICATIONS: Current Outpatient Medications (Ophthalmic Drugs)  Medication Sig  . atropine 1 % ophthalmic ointment Place 1 application into the right eye 4 (four) times daily.  . dorzolamide-timolol (COSOPT) 22.3-6.8 MG/ML ophthalmic solution Place 1 drop into the right eye 4 (four) times daily.  Marland Kitchen gatifloxacin (ZYMAXID) 0.5 % SOLN Place 1 drop into the right eye 4 (four) times daily.  . prednisoLONE acetate (PRED FORTE) 1 % ophthalmic suspension Place 1 drop into the right eye 4 (four) times daily.   No current  facility-administered medications for this visit.  (Ophthalmic Drugs)   Current Outpatient Medications (Other)  Medication Sig  . b complex vitamins capsule Take 2 capsules by mouth daily.   . Biotin 5 MG CAPS Take 1 capsule by mouth daily.   Marland Kitchen buPROPion (WELLBUTRIN XL) 300 MG 24 hr tablet TAKE 1 TABLET BY MOUTH EVERY DAY (Patient taking differently: Take 300 mg by mouth daily. )  . Cholecalciferol (VITAMIN D) 2000 UNITS CAPS Take 2,000 Int'l Units by mouth.  . Coenzyme Q10-Fish Oil-Vit E (CO-Q 10 OMEGA-3 FISH OIL PO) Take 100 mg by mouth daily.   Marland Kitchen diltiazem (CARDIZEM CD) 180 MG 24 hr capsule Take 1 capsule (180 mg total) by mouth daily.  . DULoxetine (CYMBALTA) 60 MG capsule TAKE 1 CAPSULE BY MOUTH EVERY DAY (Patient taking differently: Take 60 mg by mouth daily. )  . fluticasone (FLONASE) 50 MCG/ACT nasal spray instill 2 sprays into each nostril once daily (Patient taking differently: Place 2 sprays into both nostrils daily as needed for allergies. )  . furosemide (LASIX) 20 MG tablet TAKE 1 TABLET (20 MG TOTAL) BY MOUTH DAILY. PLEASE KEEP UPCOMING APPOINTMENT FOR FURTHER REFILLS  . HYDROcodone-acetaminophen (NORCO/VICODIN) 5-325 MG tablet Take 1 tablet by mouth every 4 (four) hours as needed for moderate pain.  Marland Kitchen LIVALO 2 MG TABS TAKE 1 TABLET BY MOUTH EVERY DAY (Patient taking differently: Take 2 mg by mouth daily. )  . loratadine (CLARITIN) 10  MG tablet Take 10 mg by mouth daily.   Marland Kitchen losartan (COZAAR) 25 MG tablet TAKE 1 TABLET EVERY DAY (Patient taking differently: Take 25 mg by mouth daily. )  . Lysine 500 MG TABS Take 500 mg by mouth daily.  . metoprolol succinate (TOPROL-XL) 25 MG 24 hr tablet TAKE 1 TABLET EVERY DAY (Patient taking differently: Take 25 mg by mouth daily. )  . nitroGLYCERIN (NITROSTAT) 0.4 MG SL tablet place 1 tablet under the tongue every 5 minutes for UP TO 3 doses if needed  . ondansetron (ZOFRAN) 4 MG tablet Take 1 tablet (4 mg total) by mouth every 8 (eight) hours  as needed for nausea or vomiting.  . valACYclovir (VALTREX) 1000 MG tablet Take 2 tablets at onset of cold sores and repeat 2 in 12 hours. (Patient taking differently: Take 2,000 mg by mouth See admin instructions. Take 2 tablets at onset of cold sores and repeat 2 in 12 hours.)  . warfarin (COUMADIN) 5 MG tablet TAKE 1/2 TO 1 TABLET DAILY AS DIRECTED BY COUMADIN CLINIC. (Patient taking differently: Take 2.5-5 mg by mouth See admin instructions. 2.53m Sunday and Tuesday 552mall other days.)  . ALPRAZolam (XANAX) 1 MG tablet Take 1 tablet (1 mg total) by mouth 3 (three) times daily as needed.   No current facility-administered medications for this visit.  (Other)      REVIEW OF SYSTEMS: ROS    Positive for: Eyes   Negative for: Constitutional, Gastrointestinal, Neurological, Skin, Genitourinary, Musculoskeletal, HENT, Endocrine, Cardiovascular, Respiratory, Psychiatric, Allergic/Imm, Heme/Lymph   Last edited by KeZenovia JordanLPN on 03/27/86/32549:9:82M. (History)       ALLERGIES Allergies  Allergen Reactions  . Chlorhexidine Base Itching    CHG WIPES  . Penicillins Hives and Rash    DID THE REACTION INVOLVE: Swelling of the face/tongue/throat, SOB, or low BP? No Sudden or severe rash/hives, skin peeling, or the inside of the mouth or nose? No Did it require medical treatment? No When did it last happen?90s If all above answers are "NO", may proceed with cephalosporin use.     PAST MEDICAL HISTORY Past Medical History:  Diagnosis Date  . ABDOMINAL PAIN RIGHT UPPER QUADRANT 11/08/2008  . ALLERGIC RHINITIS 08/01/2009  . Anxiety   . Complication of anesthesia 01/31/2015   ? doesn't remember anything after being taken to pre surgery  . Coronary artery calcification seen on CAT scan 05/30/2009   moderate risk coronary calcium score by chest CT  . DDD (degenerative disc disease), lumbar   . DEPRESSION 06/13/2008  . DYSLIPIDEMIA 02/27/2009  . DYSPNEA 02/27/2009  . Dysrhythmia     Afib  . Frequency of urination   . GERD (gastroesophageal reflux disease)    "years ago"  . Head injury 2016   did not loose consciousness  . History of Bell's palsy   . History of DVT of lower extremity 2006-- CHRONIC COUMADIN THERAPY  . History of hiatal hernia   . History of Lyme disease   . 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 Hypertension   . NON-HODGKIN'S LYMPHOMA, HX OF dx  2005--  chemoradiation completed 2006--  no recurrence   onocologist- dr odogwu--   . OSTEOARTHRITIS, MODERATE 04/07/2010  . Permanent atrial fibrillation    On chronic anti-coagulation with warfarin  . Pneumonia    x 3 - last time 02/2018  . Retroperitoneal fibrosis   . [facility]   ? - [place]- "you have  had 3 strokes"   Past Surgical History:  Procedure Laterality Date  . BREAST MASS EXCISION     BENIGN-- RIGHT BREAST  . CARDIOVASCULAR STRESS TEST  04-04-2009-- PER PT ASYMPTOMATIC-- MEDICAL MANAGEMENT   STUDY WAS EQUIVOCAL/ EF 49%/ GLOBAL HYPOKINESIS/ APPEARS TO BE A MILD ANTERIOR PERFUSION DEFECT COULD REPRESENT ISCHEMIA BUT DUE TO  ACTIVITY WORSE IN STRESS THAN AT REST POSS. THE DEFECT WAS ARTIFACTUAL  . CARDIOVERSION N/A 07/11/2015   Procedure: CARDIOVERSION;  Surgeon: Larey Dresser, MD;  Location: [facility];  Service: Cardiovascular;  Laterality: N/A;  . CT ANGIOGRAM  FEB 2011   CALCIUM SCORE 161/ POSS. MODERATE MID LAD STENOSIS  . CYSTOSCOPY W/ RETROGRADES  04/28/2011   Procedure: CYSTOSCOPY WITH RETROGRADE PYELOGRAM;  Surgeon: Fredricka Bonine, MD;  Location: [facility];  Service: Urology;  Laterality: Left;  . CYSTOSCOPY W/ URETERAL STENT REMOVAL  04/28/2011   Procedure: CYSTOSCOPY WITH STENT REMOVAL;  Surgeon: Fredricka Bonine, MD;  Location: [facility];  Service: Urology;;  . EXPLORATORY LAPAROTOMY  2005   LYMPHOMA  . EYE SURGERY Bilateral    cataract   . GAS INSERTION Right 03/31/2018   Procedure: RIGHT  EYE INSERTION OF GAS C3F8;  Surgeon: Bernarda Caffey, MD;  Location: [facility];  Service: Ophthalmology;  Laterality: Right;  . KNEE ARTHROSCOPY  2005   RIGHT  . LASER PHOTO ABLATION Right 03/31/2018   Procedure: RIGHT EYE LASER PHOTO ABLATION;  Surgeon: Bernarda Caffey, MD;  Location: [facility];  Service: Ophthalmology;  Laterality: Right;  . MULTIPLE CYSTO/ LEFT URETERAL STENT EXCHANGES  LAST ONE 10-28-10  . RETROPERITONEAL BX  2007  . REVERSE SHOULDER ARTHROPLASTY Right 01/31/2015   Procedure: RIGHT REVERSE SHOULDER ARTHROPLASTY;  Surgeon: Justice Britain, MD;  Location: [facility];  Service: Orthopedics;  Laterality: Right;  . TONSILLECTOMY  CHILD  . TOTAL KNEE ARTHROPLASTY  OCT 2010   RIGHT  . TOTAL KNEE ARTHROPLASTY  JAN 2010   LEFT  . TRANSTHORACIC ECHOCARDIOGRAM  [id number]   MILD SYSTOLIC DYSFUNCTION/ EF 38-75%/ MODERATE DIASTOLIC DYSFUCTION/ MILD MR/ MILD BIATRIAL ENLARGEMENT  . VITRECTOMY 25 GAUGE WITH SCLERAL BUCKLE Right 03/31/2018   Procedure: RIGHT VITRECTOMY 25 GAUGE WITH SCLERAL BUCKLE;  Surgeon: Bernarda Caffey, MD;  Location: [facility];  Service: Ophthalmology;  Laterality: Right;    FAMILY HISTORY Family History  Problem Relation Age of Onset  . Arthritis Mother   . Heart disease Mother   . Heart attack Mother   . Diabetes Sister   . Hypertension Sister   . Stroke Sister   . Heart attack Sister     SOCIAL HISTORY Social History   Tobacco Use  . Smoking status: Former Smoker    Packs/day: 2.00    Years: 52.00    Pack years: 104.00    Types: Cigarettes    Last attempt to quit: 03/24/2008    Years since quitting: 10.0  . Smokeless tobacco: Never Used  Substance Use Topics  . Alcohol use: Not Currently    Comment: RARE  . Drug use: No         OPHTHALMIC EXAM:  Base Eye Exam    Visual Acuity (Snellen - Linear)      Right Left   Dist [place] CF at 3' 20/60 -1   Dist ph [place]  NI       Tonometry (Tonopen, 9:16 AM)      Right Left   Pressure 18 14       Pupils  Dark Light  Shape React APD   Right 6  Round     Left 3 2 Round Brisk None       Visual Fields (Counting fingers)      Left Right    Full        Extraocular Movement      Right Left    Full, Ortho Full, Ortho       Neuro/Psych    Oriented x3:  Yes   Mood/Affect:  Normal       Dilation    Right eye:  1.0% Mydriacyl, 2.5% Phenylephrine @ 9:16 AM        Slit Lamp and Fundus Exam    External Exam      Right Left   External Periorbital edema        Slit Lamp Exam      Right Left   Lids/Lashes Dermatochalasis - upper lid; periorbital edema and ecchymosis Dermatochalasis - upper lid,  Mild Telangiectasia   Conjunctiva/Sclera Central; sutures intact; mild hooding superiorly White and quiet   Cornea Arcus, central epi defect -- closed, Well healed cataract wounds;, 1+ Punctate epithelial erosions Arcus, 2+ fine Punctate epithelial erosions   Anterior Chamber Deep; 3-4+cell Deep and quiet   Iris Round and dilated Round and dilated   Lens PC IOL in good position, trace Posterior capsular opacification PC IOL with PC folds   Vitreous post vitrectomy; good gas fill Posterior vitreous detachment, Vitreous syneresis       Fundus Exam      Right Left   Disc hazy view; grossly perfused    C/D Ratio 0.3 0.3   Macula very hazy view; grossly flat under gas    Vessels Vascular attenuation    Periphery Very hazy view; scleral buckle; no details visible           IMAGING AND PROCEDURES  Imaging and Procedures for   POCT glycosylated hemoglobin (Hb A1C)     Component Value Flag Ref Range Units Status   Hemoglobin A1C 6.7    4.0 - 5.6 % Final   HbA1c POC (<> result, manual entry)          HbA1c, POC (prediabetic range)          HbA1c, POC (controlled diabetic range)                        ASSESSMENT/PLAN:    ICD-10-CM   1. Right retinal detachment H33.21   2. Retinal edema H35.81   3. Intermediate stage nonexudative age-related macular degeneration of both eyes H35.3132   4.  Combined form of age-related cataract, both eyes H25.813     1. Retinal detachment, OD-  - initial presentation: macula- and fovea-involving rhegmatogenous retinal detachment - initial OCT showed IRF/cystic changes suggestive of chronic detachment - supero-temporal detachment from 0730 to 1200 with small horseshoe tear located at 1030 and large horseshoe tear located at 1100 - s/p pneumatic cryopexy OD (02.04.19) - was doing well post pneumatic and then was lost to f/u from 4.23.19 - 1.3.20, pt presents urgently with nasal hemisphere field defect and found to have recurrent macula-involving, fovea-splitting, temporal retinal detachment - no frank tear noted but SRF extends from old cryo scar (1000 - 0700, temporal quad) - interestingly BCVA was 20/50 OD on 1.3.20 despite detachment - now POW1 s/p SBP (41 band) + PPV/PFO/EL/FAX/14% C3F8 OD, 1.9.20             -  good gas bubble in place  - choroidal hemorrhages inf and nasal -- likely related to coumadin use -- unable to visualize today             - IOP okat 18             - cont  PF q2h OD                         zymaxid QID OD -- stop when bottle runs out                         Atropine BID OD   Cosopt BID OD                         PSO ung QID OD             - cont face down positioning 50% of time; avoid laying flat on back             - eye shield when sleeping             - post op drop and positioning instructions reviewed             - tylenol/ibuprofen for pain             - Rx given for breakthrough pain - f/u wk of 04/18/18  2. Retinal edema  - detached retina had cystic changes / IRF suggestive of chronic detachment both times  3. Age related macular degeneration, non-exudative, both eyes  - The incidence, anatomy, and pathology of dry AMD, risk of progression, and the AREDS and AREDS 2 study including smoking risks discussed with patient.  - Recommend amsler grid monitoring  - continue present management  4.  Pseudophakia OU  - s/p CE/IOL OU  - beautiful surgeries, doing well -- performed by Dr. Read Drivers, November 2019  - monitor    Ophthalmic Meds Ordered this visit:  No orders of the defined types were placed in this encounter.      Return wk of 1.27.20, for POV.  There are no Patient Instructions on file for this visit.   Explained the diagnoses, plan, and follow up with the patient and they expressed understanding.  Patient expressed understanding of the importance of proper follow up care.   This document serves as a record of services personally performed by Gardiner Sleeper, MD, PhD. It was created on their behalf by Ernest Mallick, OA, an ophthalmic assistant. The creation of this record is the provider's dictation and/or activities during the visit.    Electronically signed by: Ernest Mallick, OA 01.16.2020 3:04 PM    Gardiner Sleeper, M.D., Ph.D. Diseases & Surgery of the Retina and Vitreous [facility]  I have reviewed the above documentation for accuracy and completeness, and I agree with the above. Gardiner Sleeper, M.D., Ph.D. 04/10/18 3:04 PM   Abbreviations: M myopia (nearsighted); A astigmatism; H hyperopia (farsighted); P presbyopia; Mrx spectacle prescription;  CTL contact lenses; OD right eye; OS left eye; OU both eyes  XT exotropia; ET esotropia; PEK punctate epithelial keratitis; PEE punctate epithelial erosions; DES dry eye syndrome; MGD meibomian gland dysfunction; ATs artificial tears; PFAT's preservative free artificial tears; [place] nuclear sclerotic cataract; PSC posterior subcapsular cataract; ERM epi-retinal membrane; PVD posterior vitreous detachment; RD retinal detachment; DM diabetes mellitus; DR diabetic retinopathy; NPDR  non-proliferative diabetic retinopathy; PDR proliferative diabetic retinopathy; CSME clinically significant macular edema; DME diabetic macular edema; dbh dot blot hemorrhages; CWS cotton wool spot; POAG primary open angle  glaucoma; C/D cup-to-disc ratio; HVF humphrey visual field; GVF goldmann visual field; OCT optical coherence tomography; IOP intraocular pressure; BRVO Branch retinal vein occlusion; CRVO central retinal vein occlusion; CRAO central retinal artery occlusion; BRAO branch retinal artery occlusion; RT retinal tear; SB scleral buckle; PPV pars plana vitrectomy; VH Vitreous hemorrhage; PRP panretinal laser photocoagulation; IVK intravitreal kenalog; VMT vitreomacular traction; MH Macular hole;  NVD neovascularization of the disc; NVE neovascularization elsewhere; AREDS age related eye disease study; ARMD age related macular degeneration; POAG primary open angle glaucoma; EBMD epithelial/anterior basement membrane dystrophy; ACIOL anterior chamber intraocular lens; IOL intraocular lens; PCIOL posterior chamber intraocular lens; Phaco/IOL phacoemulsification with intraocular lens placement; [place] photorefractive keratectomy; LASIK laser assisted in situ keratomileusis; HTN hypertension; DM diabetes mellitus; COPD chronic obstructive pulmonary disease

## 2018-04-07 NOTE — Telephone Encounter (Signed)
Requested medication (s) are due for refill today:  yes  Requested medication (s) are on the active medication list:  yes  Future visit scheduled:  Yes ; has hospital f/u tomorrow, 04/08/18 (son requesting refill today, as pt. only has one pill left)   Last Refill: 01/11/18; #90; RF x 2  Requested Prescriptions  Pending Prescriptions Disp Refills   ALPRAZolam (XANAX) 1 MG tablet [Pharmacy Med Name: ALPRAZOLAM 1 MG TABLET] 90 tablet 2    Sig: TAKE 1 TABLET BY MOUTH THREE TIMES A DAY AS NEEDED     Not Delegated - Psychiatry:  Anxiolytics/Hypnotics Failed - 04/07/2018  9:31 AM      Failed - This refill cannot be delegated      Failed - Urine Drug Screen completed in last 360 days.      Passed - Valid encounter within last 6 months    Recent Outpatient Visits          1 month ago Cough   [facility], Marvis Repress, FNP   3 months ago Left shoulder pain, unspecified chronicity   [place] [facility], Alinda Sierras, MD   10 months ago Cough   [facility] at [facility], Alinda Sierras, MD   11 months ago [place] [facility], Alinda Sierras, MD   1 year ago Pain Retail buyer at [facility], Alinda Sierras, MD      Future Appointments            Tomorrow [place], Alinda Sierras, MD [place] [facility] [place]

## 2018-04-08 ENCOUNTER — Ambulatory Visit (INDEPENDENT_AMBULATORY_CARE_PROVIDER_SITE_OTHER): Payer: Medicare Other | Admitting: Family Medicine

## 2018-04-08 ENCOUNTER — Encounter: Payer: Self-pay | Admitting: Family Medicine

## 2018-04-08 ENCOUNTER — Other Ambulatory Visit: Payer: Self-pay

## 2018-04-08 ENCOUNTER — Ambulatory Visit (INDEPENDENT_AMBULATORY_CARE_PROVIDER_SITE_OTHER): Payer: Medicare Other | Admitting: Ophthalmology

## 2018-04-08 ENCOUNTER — Encounter (INDEPENDENT_AMBULATORY_CARE_PROVIDER_SITE_OTHER): Payer: Self-pay | Admitting: Ophthalmology

## 2018-04-08 VITALS — BP 130/80 | HR 86 | Temp 97.6°F | Ht 66.0 in | Wt 225.1 lb

## 2018-04-08 DIAGNOSIS — R739 Hyperglycemia, unspecified: Secondary | ICD-10-CM

## 2018-04-08 DIAGNOSIS — H3321 Serous retinal detachment, right eye: Secondary | ICD-10-CM

## 2018-04-08 DIAGNOSIS — H3581 Retinal edema: Secondary | ICD-10-CM

## 2018-04-08 DIAGNOSIS — E860 Dehydration: Secondary | ICD-10-CM | POA: Diagnosis not present

## 2018-04-08 DIAGNOSIS — I10 Essential (primary) hypertension: Secondary | ICD-10-CM | POA: Diagnosis not present

## 2018-04-08 DIAGNOSIS — F411 Generalized anxiety disorder: Secondary | ICD-10-CM | POA: Diagnosis not present

## 2018-04-08 DIAGNOSIS — H353132 Nonexudative age-related macular degeneration, bilateral, intermediate dry stage: Secondary | ICD-10-CM

## 2018-04-08 DIAGNOSIS — H25813 Combined forms of age-related cataract, bilateral: Secondary | ICD-10-CM

## 2018-04-08 LAB — POCT GLYCOSYLATED HEMOGLOBIN (HGB A1C): Hemoglobin A1C: 6.7 % — AB (ref 4.0–5.6)

## 2018-04-08 MED ORDER — ALPRAZOLAM 1 MG PO TABS: 1.0000 mg | ORAL_TABLET | Freq: Three times a day (TID) | ORAL | 2 refills | Status: DC | PRN

## 2018-04-08 NOTE — Telephone Encounter (Signed)
Last OV 12/24/17, Next OV Today  Last filled 01/11/18, # 90 with 2 refills

## 2018-04-08 NOTE — Progress Notes (Signed)
Subjective:     Patient ID: [name], female   DOB: 1946/02/22, 73 y.o.   MRN: [id number]  HPI Patient is seen for ER follow-up.  She has multiple chronic problems including history of atrial fibrillation, hypertension, past history of DVT, chronic diastolic heart failure, osteoarthritis, obesity, hyperlipidemia, past history of non-Hodgkin's lymphoma, chronic anxiety.  Her recent history is that she had right retinal detachment and underwent surgery 8 days ago.  Following surgery she had decreased appetite and decreased fluid intake.  She went to the ER couple days ago with increased generalized weakness and some dizziness and near syncope.    She was determined to be dehydrated.  She had several labs and chest x-ray.  Her chest x-ray is unremarkable.  Creatinine baseline is usually around 1.1 and increased to 1.36.  Usual hemoglobin around 12.5 and this was up to 15.9.  Urinalysis unremarkable.  She received IV fluids and has felt better since then.  She has been conscientious to drink more fluids since that time  She does have history of hyperglycemia but has not had any recent A1c.  Most recent A1c on record 6.7%.  She does have frequent dry mouth.  Past Medical History:  Diagnosis Date  . ABDOMINAL PAIN RIGHT UPPER QUADRANT 11/08/2008  . ALLERGIC RHINITIS 08/01/2009  . Anxiety   . Complication of anesthesia 01/31/2015   ? doesn't remember anything after being taken to pre surgery  . Coronary artery calcification seen on CAT scan 05/30/2009   moderate risk coronary calcium score by chest CT  . DDD (degenerative disc disease), lumbar   . DEPRESSION 06/13/2008  . DYSLIPIDEMIA 02/27/2009  . DYSPNEA 02/27/2009  . Dysrhythmia    Afib  . Frequency of urination   . GERD (gastroesophageal reflux disease)    "years ago"  . Head injury 2016   did not loose consciousness  . History of Bell's palsy   . History of DVT of lower extremity 2006-- CHRONIC COUMADIN THERAPY  . History of  hiatal hernia   . History of Lyme disease   . 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 Hypertension   . NON-HODGKIN'S LYMPHOMA, HX OF dx  2005--  chemoradiation completed 2006--  no recurrence   onocologist- dr odogwu--   . OSTEOARTHRITIS, MODERATE 04/07/2010  . Permanent atrial fibrillation    On chronic anti-coagulation with warfarin  . Pneumonia    x 3 - last time 02/2018  . Retroperitoneal fibrosis   . Stroke [facility])    ? - [place]- "you have had 3 strokes"   Past Surgical History:  Procedure Laterality Date  . BREAST MASS EXCISION     BENIGN-- RIGHT BREAST  . CARDIOVASCULAR STRESS TEST  04-04-2009-- PER PT ASYMPTOMATIC-- MEDICAL MANAGEMENT   STUDY WAS EQUIVOCAL/ EF 49%/ GLOBAL HYPOKINESIS/ APPEARS TO BE A MILD ANTERIOR PERFUSION DEFECT COULD REPRESENT ISCHEMIA BUT DUE TO  ACTIVITY WORSE IN STRESS THAN AT REST POSS. THE DEFECT WAS ARTIFACTUAL  . CARDIOVERSION N/A 07/11/2015   Procedure: CARDIOVERSION;  Surgeon: Larey Dresser, MD;  Location: [place];  Service: Cardiovascular;  Laterality: N/A;  . CT ANGIOGRAM  FEB 2011   CALCIUM SCORE 161/ POSS. MODERATE MID LAD STENOSIS  . CYSTOSCOPY W/ RETROGRADES  04/28/2011   Procedure: CYSTOSCOPY WITH RETROGRADE PYELOGRAM;  Surgeon: Fredricka Bonine, MD;  Location: [facility];  Service: Urology;  Laterality: Left;  . CYSTOSCOPY W/ URETERAL STENT REMOVAL  04/28/2011   Procedure: CYSTOSCOPY WITH STENT  REMOVAL;  Surgeon: Fredricka Bonine, MD;  Location: [facility];  Service: Urology;;  . EXPLORATORY LAPAROTOMY  2005   LYMPHOMA  . EYE SURGERY Bilateral    cataract   . GAS INSERTION Right 03/31/2018   Procedure: RIGHT EYE INSERTION OF GAS C3F8;  Surgeon: Bernarda Caffey, MD;  Location: [place];  Service: Ophthalmology;  Laterality: Right;  . KNEE ARTHROSCOPY  2005   RIGHT  . LASER PHOTO ABLATION Right 03/31/2018   Procedure: RIGHT EYE LASER PHOTO ABLATION;  Surgeon: Bernarda Caffey, MD;  Location: [place];  Service: Ophthalmology;  Laterality: Right;  . MULTIPLE CYSTO/ LEFT URETERAL STENT EXCHANGES  LAST ONE 10-28-10  . RETROPERITONEAL BX  2007  . REVERSE SHOULDER ARTHROPLASTY Right 01/31/2015   Procedure: RIGHT REVERSE SHOULDER ARTHROPLASTY;  Surgeon: Justice Britain, MD;  Location: [place];  Service: Orthopedics;  Laterality: Right;  . TONSILLECTOMY  CHILD  . TOTAL KNEE ARTHROPLASTY  OCT 2010   RIGHT  . TOTAL KNEE ARTHROPLASTY  JAN 2010   LEFT  . TRANSTHORACIC ECHOCARDIOGRAM  [phone]   MILD SYSTOLIC DYSFUNCTION/ EF 70-17%/ MODERATE DIASTOLIC DYSFUCTION/ MILD MR/ MILD BIATRIAL ENLARGEMENT  . VITRECTOMY 25 GAUGE WITH SCLERAL BUCKLE Right 03/31/2018   Procedure: RIGHT VITRECTOMY 25 GAUGE WITH SCLERAL BUCKLE;  Surgeon: Bernarda Caffey, MD;  Location: [place];  Service: Ophthalmology;  Laterality: Right;    reports that she quit smoking about 10 years ago. Her smoking use included cigarettes. She has a 104.00 pack-year smoking history. She has never used smokeless tobacco. She reports previous alcohol use. She reports that she does not use drugs. family history includes Arthritis in her mother; Diabetes in her sister; Heart attack in her mother and sister; Heart disease in her mother; Hypertension in her sister; Stroke in her sister. Allergies  Allergen Reactions  . Chlorhexidine Base Itching    CHG WIPES  . Penicillins Hives and Rash    DID THE REACTION INVOLVE: Swelling of the face/tongue/throat, SOB, or low BP? No Sudden or severe rash/hives, skin peeling, or the inside of the mouth or nose? No Did it require medical treatment? No When did it last happen?90s If all above answers are "NO", may proceed with cephalosporin use.      Review of Systems  Constitutional: Negative for chills and fever.  Respiratory: Negative for shortness of breath.   Cardiovascular: Negative for chest pain.  Gastrointestinal: Negative for abdominal pain.  Endocrine: Positive for  polydipsia.  Genitourinary: Negative for dysuria.  Neurological: Negative for dizziness.       Objective:   Physical Exam Constitutional:      Appearance: Normal appearance.  HENT:     Mouth/Throat:     Mouth: Mucous membranes are moist.     Pharynx: Oropharynx is clear.  Neck:     Musculoskeletal: Neck supple.  Cardiovascular:     Rate and Rhythm: Normal rate.  Pulmonary:     Effort: Pulmonary effort is normal.     Breath sounds: Normal breath sounds.  Musculoskeletal:     Right lower leg: No edema.     Left lower leg: No edema.  Neurological:     Mental Status: She is alert.        Assessment:     #1 recent dehydration following surgery for right retinal detachment -improved following IV fluids in the emergency room  #2 chronic anxiety  #3 hypertension stable  #4 history of chronic atrial fibrillation on chronic Coumadin  #5 history of hyperglycemia  Plan:     -Continue good hydration.  She appears to be doing much better with fluids since her ER visit -Recheck hemoglobin A1c -We had multiple discussions in the past about potential risk of benzodiazepine use especially at her age.  We did agree to refill of alprazolam but I discussed risk of falls and potentially loss of memory with long-term use.  [name] MD [facility]

## 2018-04-10 ENCOUNTER — Encounter (INDEPENDENT_AMBULATORY_CARE_PROVIDER_SITE_OTHER): Payer: Self-pay | Admitting: Ophthalmology

## 2018-04-18 NOTE — Progress Notes (Signed)
CHIEF COMPLAINT Patient presents for Retina Follow Up   HISTORY OF PRESENT ILLNESS: [name] is a 73 y.o. female who presents to the clinic today for:   HPI    Retina Follow Up    Patient presents with  Retinal Break/Detachment.  In right eye.  This started 3 weeks ago.  Severity is moderate.  Duration of 3 weeks.  Since onset it is gradually improving.  I, the attending physician,  performed the HPI with the patient and updated documentation appropriately.          Comments    Patient here for retina follow up for RD OD 2 weeks S/P SBP + PPV (01-09). Patient states wears guard all the time. Has a little bit of eye pain- more like a pinch on occasion. Using eye drops. Ran out of atropine.       Last edited by Bernarda Caffey, MD on 04/20/2018 10:48 PM. (History)    pt states she is maintaining head down 50% of the time, she states she cannot see a line in her vision from the gas bubble  Referring physician: [name], MD [place], [place] [id number]  HISTORICAL INFORMATION:   Selected notes from the MEDICAL RECORD NUMBER Referred by Dr. Delman Cheadle for concern of mac off RD LEE- 02.04.19  Ocular Hx-  PMH- A-Fib on coumadin, severe anxiety, CAD, depression    CURRENT MEDICATIONS: Current Outpatient Medications (Ophthalmic Drugs)  Medication Sig  . atropine 1 % ophthalmic ointment Place 1 application into the right eye 4 (four) times daily.  . dorzolamide-timolol (COSOPT) 22.3-6.8 MG/ML ophthalmic solution Place 1 drop into the right eye 4 (four) times daily.  Marland Kitchen gatifloxacin (ZYMAXID) 0.5 % SOLN Place 1 drop into the right eye 4 (four) times daily.  . prednisoLONE acetate (PRED FORTE) 1 % ophthalmic suspension Place 1 drop into the right eye 4 (four) times daily.   No current facility-administered medications for this visit.  (Ophthalmic Drugs)   Current Outpatient Medications (Other)  Medication Sig  . ALPRAZolam (XANAX) 1 MG tablet Take 1 tablet (1  mg total) by mouth 3 (three) times daily as needed.  Marland Kitchen b complex vitamins capsule Take 2 capsules by mouth daily.   . Biotin 5 MG CAPS Take 1 capsule by mouth daily.   Marland Kitchen buPROPion (WELLBUTRIN XL) 300 MG 24 hr tablet TAKE 1 TABLET BY MOUTH EVERY DAY (Patient taking differently: Take 300 mg by mouth daily. )  . Cholecalciferol (VITAMIN D) 2000 UNITS CAPS Take 2,000 Int'l Units by mouth.  . Coenzyme Q10-Fish Oil-Vit E (CO-Q 10 OMEGA-3 FISH OIL PO) Take 100 mg by mouth daily.   Marland Kitchen diltiazem (CARDIZEM CD) 180 MG 24 hr capsule Take 1 capsule (180 mg total) by mouth daily.  . DULoxetine (CYMBALTA) 60 MG capsule TAKE 1 CAPSULE BY MOUTH EVERY DAY (Patient taking differently: Take 60 mg by mouth daily. )  . fluticasone (FLONASE) 50 MCG/ACT nasal spray instill 2 sprays into each nostril once daily (Patient taking differently: Place 2 sprays into both nostrils daily as needed for allergies. )  . furosemide (LASIX) 20 MG tablet TAKE 1 TABLET (20 MG TOTAL) BY MOUTH DAILY. PLEASE KEEP UPCOMING APPOINTMENT FOR FURTHER REFILLS  . HYDROcodone-acetaminophen (NORCO/VICODIN) 5-325 MG tablet Take 1 tablet by mouth every 4 (four) hours as needed for moderate pain.  Marland Kitchen LIVALO 2 MG TABS TAKE 1 TABLET BY MOUTH EVERY DAY (Patient taking differently: Take 2 mg by mouth daily. )  .  loratadine (CLARITIN) 10 MG tablet Take 10 mg by mouth daily.   Marland Kitchen losartan (COZAAR) 25 MG tablet TAKE 1 TABLET EVERY DAY (Patient taking differently: Take 25 mg by mouth daily. )  . Lysine 500 MG TABS Take 500 mg by mouth daily.  . metoprolol succinate (TOPROL-XL) 25 MG 24 hr tablet TAKE 1 TABLET EVERY DAY (Patient taking differently: Take 25 mg by mouth daily. )  . nitroGLYCERIN (NITROSTAT) 0.4 MG SL tablet place 1 tablet under the tongue every 5 minutes for UP TO 3 doses if needed  . ondansetron (ZOFRAN) 4 MG tablet Take 1 tablet (4 mg total) by mouth every 8 (eight) hours as needed for nausea or vomiting.  . valACYclovir (VALTREX) 1000 MG tablet  Take 2 tablets at onset of cold sores and repeat 2 in 12 hours. (Patient taking differently: Take 2,000 mg by mouth See admin instructions. Take 2 tablets at onset of cold sores and repeat 2 in 12 hours.)  . warfarin (COUMADIN) 5 MG tablet TAKE 1/2 TO 1 TABLET DAILY AS DIRECTED BY COUMADIN CLINIC. (Patient taking differently: Take 2.5-5 mg by mouth See admin instructions. 2.24m Sunday and Tuesday 548mall other days.)   No current facility-administered medications for this visit.  (Other)      REVIEW OF SYSTEMS: ROS    Positive for: Musculoskeletal, Eyes   Negative for: Constitutional, Gastrointestinal, Neurological, Skin, Genitourinary, HENT, Endocrine, Cardiovascular, Respiratory, Psychiatric, Allergic/Imm, Heme/Lymph   Last edited by ClTheodore Demarkn 04/20/2018  9:50 AM. (History)       ALLERGIES Allergies  Allergen Reactions  . Chlorhexidine Base Itching    CHG WIPES  . Penicillins Hives and Rash    DID THE REACTION INVOLVE: Swelling of the face/tongue/throat, SOB, or low BP? No Sudden or severe rash/hives, skin peeling, or the inside of the mouth or nose? No Did it require medical treatment? No When did it last happen?90s If all above answers are "NO", may proceed with cephalosporin use.     PAST MEDICAL HISTORY Past Medical History:  Diagnosis Date  . ABDOMINAL PAIN RIGHT UPPER QUADRANT 11/08/2008  . ALLERGIC RHINITIS 08/01/2009  . Anxiety   . Complication of anesthesia 01/31/2015   ? doesn't remember anything after being taken to pre surgery  . Coronary artery calcification seen on CAT scan 05/30/2009   moderate risk coronary calcium score by chest CT  . DDD (degenerative disc disease), lumbar   . DEPRESSION 06/13/2008  . DYSLIPIDEMIA 02/27/2009  . DYSPNEA 02/27/2009  . Dysrhythmia    Afib  . Frequency of urination   . GERD (gastroesophageal reflux disease)    "years ago"  . Head injury 2016   did not loose consciousness  . History of Bell's palsy   .  History of DVT of lower extremity 2006-- CHRONIC COUMADIN THERAPY  . History of hiatal hernia   . History of Lyme disease   . 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 Hypertension   . NON-HODGKIN'S LYMPHOMA, HX OF dx  2005--  chemoradiation completed 2006--  no recurrence   onocologist- dr odogwu--   . OSTEOARTHRITIS, MODERATE 04/07/2010  . Permanent atrial fibrillation    On chronic anti-coagulation with warfarin  . Pneumonia    x 3 - last time 02/2018  . Retroperitoneal fibrosis   [facility]   [facility] [place]- "you have had 3 strokes"   Past Surgical History:  Procedure Laterality Date  . BREAST MASS EXCISION  BENIGN-- RIGHT BREAST  . CARDIOVASCULAR STRESS TEST  04-04-2009-- PER PT ASYMPTOMATIC-- MEDICAL MANAGEMENT   STUDY WAS EQUIVOCAL/ EF 49%/ GLOBAL HYPOKINESIS/ APPEARS TO BE A MILD ANTERIOR PERFUSION DEFECT COULD REPRESENT ISCHEMIA BUT DUE TO  ACTIVITY WORSE IN STRESS THAN AT REST POSS. THE DEFECT WAS ARTIFACTUAL  . CARDIOVERSION N/A 07/11/2015   Procedure: CARDIOVERSION;  Surgeon: Larey Dresser, MD;  Location: [facility];  Service: Cardiovascular;  Laterality: N/A;  . CT ANGIOGRAM  FEB 2011   CALCIUM SCORE 161/ POSS. MODERATE MID LAD STENOSIS  . CYSTOSCOPY W/ RETROGRADES  04/28/2011   Procedure: CYSTOSCOPY WITH RETROGRADE PYELOGRAM;  Surgeon: Fredricka Bonine, MD;  Location: [facility];  Service: Urology;  Laterality: Left;  . CYSTOSCOPY W/ URETERAL STENT REMOVAL  04/28/2011   Procedure: CYSTOSCOPY WITH STENT REMOVAL;  Surgeon: Fredricka Bonine, MD;  Location: [facility];  Service: Urology;;  . EXPLORATORY LAPAROTOMY  2005   LYMPHOMA  . EYE SURGERY Bilateral    cataract   . GAS INSERTION Right 03/31/2018   Procedure: RIGHT EYE INSERTION OF GAS C3F8;  Surgeon: Bernarda Caffey, MD;  Location: [facility];  Service: Ophthalmology;  Laterality: Right;  . KNEE ARTHROSCOPY  2005   RIGHT  . LASER PHOTO ABLATION  Right 03/31/2018   Procedure: RIGHT EYE LASER PHOTO ABLATION;  Surgeon: Bernarda Caffey, MD;  Location: [place];  Service: Ophthalmology;  Laterality: Right;  . MULTIPLE CYSTO/ LEFT URETERAL STENT EXCHANGES  LAST ONE 10-28-10  . RETROPERITONEAL BX  2007  . REVERSE SHOULDER ARTHROPLASTY Right 01/31/2015   Procedure: RIGHT REVERSE SHOULDER ARTHROPLASTY;  Surgeon: Justice Britain, MD;  Location: [place];  Service: Orthopedics;  Laterality: Right;  . TONSILLECTOMY  CHILD  . TOTAL KNEE ARTHROPLASTY  OCT 2010   RIGHT  . TOTAL KNEE ARTHROPLASTY  JAN 2010   LEFT  . TRANSTHORACIC ECHOCARDIOGRAM  [id number]   MILD SYSTOLIC DYSFUNCTION/ EF 34-19%/ MODERATE DIASTOLIC DYSFUCTION/ MILD MR/ MILD BIATRIAL ENLARGEMENT  . VITRECTOMY 25 GAUGE WITH SCLERAL BUCKLE Right 03/31/2018   Procedure: RIGHT VITRECTOMY 25 GAUGE WITH SCLERAL BUCKLE;  Surgeon: Bernarda Caffey, MD;  Location: [place];  Service: Ophthalmology;  Laterality: Right;    FAMILY HISTORY Family History  Problem Relation Age of Onset  . Arthritis Mother   . Heart disease Mother   . Heart attack Mother   . Diabetes Sister   . Hypertension Sister   . Stroke Sister   . Heart attack Sister     SOCIAL HISTORY Social History   Tobacco Use  . Smoking status: Former Smoker    Packs/day: 2.00    Years: 52.00    Pack years: 104.00    Types: Cigarettes    Last attempt to quit: 03/24/2008    Years since quitting: 10.0  . Smokeless tobacco: Never Used  Substance Use Topics  . Alcohol use: Not Currently    Comment: RARE  . Drug use: No         OPHTHALMIC EXAM:  Base Eye Exam    Visual Acuity (Snellen - Linear)      Right Left   Dist [place] CF at 3' 20/40   Dist ph [place] NI 20/25 -2       Tonometry (Tonopen, 9:44 AM)      Right Left   Pressure 20 13       Pupils      Dark Light Shape React APD   Right 6  Round     Left  3 2 Round Brisk None       Visual Fields (Counting fingers)      Left Right    Full Full       Extraocular Movement       Right Left    Full, Ortho Full, Ortho       Neuro/Psych    Oriented x3:  Yes   Mood/Affect:  Normal       Dilation    Both eyes:  1.0% Mydriacyl, 2.5% Phenylephrine @ 9:45 AM        Slit Lamp and Fundus Exam    External Exam      Right Left   External Periorbital edema        Slit Lamp Exam      Right Left   Lids/Lashes Dermatochalasis - upper lid; periorbital edema and ecchymosis Dermatochalasis - upper lid,  Mild Telangiectasia   Conjunctiva/Sclera [place] resolving; sutures intact; mild hooding superiorly White and quiet   Cornea Arcus, central epi defect -- closed, Well healed cataract wound, 2+ Punctate epithelial erosions Arcus, 2+ fine Punctate epithelial erosions   Anterior Chamber Deep and quiet Deep and quiet   Iris Round and dilated Round and dilated   Lens PC IOL in good position, trace Posterior capsular opacification PC IOL with PC folds   Vitreous post vitrectomy; gas bubble 55-60% Posterior vitreous detachment, Vitreous syneresis       Fundus Exam      Right Left   Disc hazy view; perfused Tilted disc, Peripapillary atrophy   C/D Ratio 0.3 0.3   Macula hazy view; grossly flat under gas attached; drusen; RPE mottling and clumping, No heme or edema   Vessels Vascular attenuation Vascular attenuation   Periphery Retina attached over buckle, good 360 laser over buckle inferior choroidal hemorrhage clearing Attached          IMAGING AND PROCEDURES  Imaging and Procedures for   OCT, Retina - OU - Both Eyes       Right Eye Progression has no prior data.   Left Eye Quality was good. Central Foveal Thickness: 246. Progression has been stable. Findings include normal foveal contour, no IRF, no SRF, retinal drusen , outer retinal atrophy, subretinal hyper-reflective material, epiretinal membrane (Thin choriod).   Notes *Images captured and stored on drive  Diagnosis / Impression:  OD: no image (gas bubble) OS: Non-Exudative AMD -- stable from  prior  Clinical management:  See below  Abbreviations: NFP - Normal foveal profile. CME - cystoid macular edema. PED - pigment epithelial detachment. IRF - intraretinal fluid. SRF - subretinal fluid. EZ - ellipsoid zone. ERM - epiretinal membrane. ORA - outer retinal atrophy. ORT - outer retinal tubulation. SRHM - subretinal hyper-reflective material                  ASSESSMENT/PLAN:    ICD-10-CM   1. Right retinal detachment H33.21   2. Retinal edema H35.81 OCT, Retina - OU - Both Eyes  3. Intermediate stage nonexudative age-related macular degeneration of both eyes H35.3132   4. Combined form of age-related cataract, both eyes H25.813     1. Retinal detachment, OD-  - initial presentation: macula- and fovea-involving rhegmatogenous retinal detachment - initial OCT showed IRF/cystic changes suggestive of chronic detachment - supero-temporal detachment from 0730 to 1200 with small horseshoe tear located at 1030 and large horseshoe tear located at 1100 - s/p pneumatic cryopexy OD (02.04.19) - was doing well post pneumatic and then was lost to  f/u from 4.23.19 - 1.3.20, pt presents urgently with nasal hemisphere field defect and found to have recurrent macula-involving, fovea-splitting, temporal retinal detachment - no frank tear noted but SRF extends from old cryo scar (1000 - 0700, temporal quad) - interestingly BCVA was 20/50 OD on 1.3.20 despite detachment - now POW3 s/p SBP (41 band) + PPV/PFO/EL/FAX/14% C3F8 OD, 1.9.20             - good gas bubble in place -- 55-60%  - choroidal hemorrhages inf and nasal -- likely related to coumadin use -- clearing today             - IOP okat 20             - cont  PF 4 times a day OD                         Atropine BID OD-- okay to stop   Cosopt BID OD                         PSO ung QID OD -- only at bedtime             - cont face down positioning 50% of time; avoid laying flat on back             - post op drop and  positioning instructions reviewed             - tylenol/ibuprofen for pain             - Rx given for breakthrough pain - f/u 2 weeks  2. Retinal edema  - detached retina had cystic changes / IRF suggestive of chronic detachment both times  3. Age related macular degeneration, non-exudative, both eyes  - The incidence, anatomy, and pathology of dry AMD, risk of progression, and the AREDS and AREDS 2 study including smoking risks discussed with patient.  - Recommend amsler grid monitoring  - continue present management  4. Pseudophakia OU  - s/p CE/IOL OU  - beautiful surgeries, doing well -- performed by Dr. Read Drivers, November 2019  - monitor    Ophthalmic Meds Ordered this visit:  No orders of the defined types were placed in this encounter.      Return in about 2 weeks (around 05/04/2018) for f/u RD OD, DFE, OCT.  There are no Patient Instructions on file for this visit.   Explained the diagnoses, plan, and follow up with the patient and they expressed understanding.  Patient expressed understanding of the importance of proper follow up care.   This document serves as a record of services personally performed by Gardiner Sleeper, MD, PhD. It was created on their behalf by Ernest Mallick, OA, an ophthalmic assistant. The creation of this record is the provider's dictation and/or activities during the visit.    Electronically signed by: Ernest Mallick, OA  01.27.2020 10:50 PM     Gardiner Sleeper, M.D., Ph.D. Diseases & Surgery of the Retina and Vitreous [facility]  I have reviewed the above documentation for accuracy and completeness, and I agree with the above. Gardiner Sleeper, M.D., Ph.D. 04/20/18 10:52 PM    Abbreviations: M myopia (nearsighted); A astigmatism; H hyperopia (farsighted); P presbyopia; Mrx spectacle prescription;  CTL contact lenses; OD right eye; OS left eye; OU both eyes  XT exotropia; ET esotropia; PEK punctate epithelial  keratitis;  PEE punctate epithelial erosions; DES dry eye syndrome; MGD meibomian gland dysfunction; ATs artificial tears; PFAT's preservative free artificial tears; [place] nuclear sclerotic cataract; PSC posterior subcapsular cataract; ERM epi-retinal membrane; PVD posterior vitreous detachment; RD retinal detachment; DM diabetes mellitus; DR diabetic retinopathy; NPDR non-proliferative diabetic retinopathy; PDR proliferative diabetic retinopathy; CSME clinically significant macular edema; DME diabetic macular edema; dbh dot blot hemorrhages; CWS cotton wool spot; POAG primary open angle glaucoma; C/D cup-to-disc ratio; HVF humphrey visual field; GVF goldmann visual field; OCT optical coherence tomography; IOP intraocular pressure; BRVO Branch retinal vein occlusion; CRVO central retinal vein occlusion; CRAO central retinal artery occlusion; BRAO branch retinal artery occlusion; RT retinal tear; SB scleral buckle; PPV pars plana vitrectomy; VH Vitreous hemorrhage; PRP panretinal laser photocoagulation; IVK intravitreal kenalog; VMT vitreomacular traction; MH Macular hole;  NVD neovascularization of the disc; NVE neovascularization elsewhere; AREDS age related eye disease study; ARMD age related macular degeneration; POAG primary open angle glaucoma; EBMD epithelial/anterior basement membrane dystrophy; ACIOL anterior chamber intraocular lens; IOL intraocular lens; PCIOL posterior chamber intraocular lens; Phaco/IOL phacoemulsification with intraocular lens placement; [place] photorefractive keratectomy; LASIK laser assisted in situ keratomileusis; HTN hypertension; DM diabetes mellitus; COPD chronic obstructive pulmonary disease

## 2018-04-19 ENCOUNTER — Ambulatory Visit: Payer: Medicare Other

## 2018-04-19 DIAGNOSIS — Z7901 Long term (current) use of anticoagulants: Secondary | ICD-10-CM

## 2018-04-19 DIAGNOSIS — I824Y9 Acute embolism and thrombosis of unspecified deep veins of unspecified proximal lower extremity: Secondary | ICD-10-CM | POA: Diagnosis not present

## 2018-04-19 DIAGNOSIS — Z5181 Encounter for therapeutic drug level monitoring: Secondary | ICD-10-CM

## 2018-04-19 LAB — POCT INR: INR: 1.8 — AB (ref 2.0–3.0)

## 2018-04-19 NOTE — Patient Instructions (Signed)
Please take 1 whole tablet tonight, then continue taking same dosage 1 tablet (5mg ) every day except 1/2 tablet (2.5mg ) on Sundays and Tuesdays.  Recheck INR in 3 weeks

## 2018-04-20 ENCOUNTER — Ambulatory Visit (INDEPENDENT_AMBULATORY_CARE_PROVIDER_SITE_OTHER): Payer: Medicare Other | Admitting: Ophthalmology

## 2018-04-20 ENCOUNTER — Encounter (INDEPENDENT_AMBULATORY_CARE_PROVIDER_SITE_OTHER): Payer: Self-pay | Admitting: Ophthalmology

## 2018-04-20 DIAGNOSIS — H3581 Retinal edema: Secondary | ICD-10-CM

## 2018-04-20 DIAGNOSIS — H25813 Combined forms of age-related cataract, bilateral: Secondary | ICD-10-CM

## 2018-04-20 DIAGNOSIS — H353132 Nonexudative age-related macular degeneration, bilateral, intermediate dry stage: Secondary | ICD-10-CM

## 2018-04-20 DIAGNOSIS — H3321 Serous retinal detachment, right eye: Secondary | ICD-10-CM

## 2018-04-25 ENCOUNTER — Other Ambulatory Visit: Payer: Self-pay | Admitting: Cardiology

## 2018-04-25 DIAGNOSIS — I4819 Other persistent atrial fibrillation: Secondary | ICD-10-CM

## 2018-05-11 ENCOUNTER — Encounter (INDEPENDENT_AMBULATORY_CARE_PROVIDER_SITE_OTHER): Payer: Medicare Other | Admitting: Ophthalmology

## 2018-05-16 NOTE — Progress Notes (Signed)
CHIEF COMPLAINT Patient presents for Retina Follow Up and Post-op Follow-up   HISTORY OF PRESENT ILLNESS: [name] is a 73 y.o. female who presents to the clinic today for:   HPI    Retina Follow Up    Patient presents with  Retinal Break/Detachment.  In right eye.  Severity is severe.  Duration of 4 weeks.  Since onset it is gradually improving.  I, the attending physician,  performed the HPI with the patient and updated documentation appropriately.          Post-op Follow-up    In right eye.  Discomfort includes none.  Negative for pain, itching, foreign body sensation, tearing, discharge and floaters.  Vision is improved.  I, the attending physician,  performed the HPI with the patient and updated documentation appropriately.          Comments    4 Week s/p RD OD. Patients states some improvement. Patient states the liquid like is almost gone. No floaters or Flashes       Last edited by Bernarda Caffey, MD on 05/17/2018  2:36 PM. (History)    pt states feels like the gas is very small, she states she is only using Cosopt right now   Referring physician: [name], MD [place], [place] [address]  HISTORICAL INFORMATION:   Selected notes from the MEDICAL RECORD NUMBER Referred by Dr. Delman Cheadle for concern of mac off RD LEE- 02.04.19  Ocular Hx-  PMH- A-Fib on coumadin, severe anxiety, CAD, depression    CURRENT MEDICATIONS: Current Outpatient Medications (Ophthalmic Drugs)  Medication Sig  . atropine 1 % ophthalmic ointment Place 1 application into the right eye 4 (four) times daily.  . dorzolamide-timolol (COSOPT) 22.3-6.8 MG/ML ophthalmic solution Place 1 drop into the right eye 4 (four) times daily.  Marland Kitchen gatifloxacin (ZYMAXID) 0.5 % SOLN Place 1 drop into the right eye 4 (four) times daily.  . prednisoLONE acetate (PRED FORTE) 1 % ophthalmic suspension Place 1 drop into the right eye 3 (three) times daily.   No current facility-administered  medications for this visit.  (Ophthalmic Drugs)   Current Outpatient Medications (Other)  Medication Sig  . ALPRAZolam (XANAX) 1 MG tablet Take 1 tablet (1 mg total) by mouth 3 (three) times daily as needed.  Marland Kitchen b complex vitamins capsule Take 2 capsules by mouth daily.   . Biotin 5 MG CAPS Take 1 capsule by mouth daily.   Marland Kitchen buPROPion (WELLBUTRIN XL) 300 MG 24 hr tablet TAKE 1 TABLET BY MOUTH EVERY DAY (Patient taking differently: Take 300 mg by mouth daily. )  . Cholecalciferol (VITAMIN D) 2000 UNITS CAPS Take 2,000 Int'l Units by mouth.  . Coenzyme Q10-Fish Oil-Vit E (CO-Q 10 OMEGA-3 FISH OIL PO) Take 100 mg by mouth daily.   Marland Kitchen diltiazem (CARDIZEM CD) 180 MG 24 hr capsule Take 1 capsule (180 mg total) by mouth daily.  . DULoxetine (CYMBALTA) 60 MG capsule TAKE 1 CAPSULE BY MOUTH EVERY DAY (Patient taking differently: Take 60 mg by mouth daily. )  . fluticasone (FLONASE) 50 MCG/ACT nasal spray instill 2 sprays into each nostril once daily (Patient taking differently: Place 2 sprays into both nostrils daily as needed for allergies. )  . furosemide (LASIX) 20 MG tablet TAKE 1 TABLET (20 MG TOTAL) BY MOUTH DAILY. PLEASE KEEP UPCOMING APPOINTMENT FOR FURTHER REFILLS  . HYDROcodone-acetaminophen (NORCO/VICODIN) 5-325 MG tablet Take 1 tablet by mouth every 4 (four) hours as needed for moderate pain.  Marland Kitchen  LIVALO 2 MG TABS TAKE 1 TABLET BY MOUTH EVERY DAY (Patient taking differently: Take 2 mg by mouth daily. )  . loratadine (CLARITIN) 10 MG tablet Take 10 mg by mouth daily.   Marland Kitchen losartan (COZAAR) 25 MG tablet TAKE 1 TABLET EVERY DAY (Patient taking differently: Take 25 mg by mouth daily. )  . Lysine 500 MG TABS Take 500 mg by mouth daily.  . metoprolol succinate (TOPROL-XL) 25 MG 24 hr tablet TAKE 1 TABLET BY MOUTH EVERY DAY  . nitroGLYCERIN (NITROSTAT) 0.4 MG SL tablet place 1 tablet under the tongue every 5 minutes for UP TO 3 doses if needed  . ondansetron (ZOFRAN) 4 MG tablet Take 1 tablet (4 mg  total) by mouth every 8 (eight) hours as needed for nausea or vomiting.  . valACYclovir (VALTREX) 1000 MG tablet Take 2 tablets at onset of cold sores and repeat 2 in 12 hours. (Patient taking differently: Take 2,000 mg by mouth See admin instructions. Take 2 tablets at onset of cold sores and repeat 2 in 12 hours.)  . warfarin (COUMADIN) 5 MG tablet TAKE 1/2 TO 1 TABLET DAILY AS DIRECTED BY COUMADIN CLINIC.   No current facility-administered medications for this visit.  (Other)      REVIEW OF SYSTEMS: ROS    Positive for: Musculoskeletal, Eyes   Negative for: Constitutional, Gastrointestinal, Neurological, Skin, Genitourinary, HENT, Endocrine, Cardiovascular, Respiratory, Psychiatric, Allergic/Imm, Heme/Lymph   Last edited by Elmore Guise on 05/17/2018  1:19 PM. (History)       ALLERGIES Allergies  Allergen Reactions  . Chlorhexidine Base Itching    CHG WIPES  . Penicillins Hives and Rash    DID THE REACTION INVOLVE: Swelling of the face/tongue/throat, SOB, or low BP? No Sudden or severe rash/hives, skin peeling, or the inside of the mouth or nose? No Did it require medical treatment? No When did it last happen?90s If all above answers are "NO", may proceed with cephalosporin use.     PAST MEDICAL HISTORY Past Medical History:  Diagnosis Date  . ABDOMINAL PAIN RIGHT UPPER QUADRANT 11/08/2008  . ALLERGIC RHINITIS 08/01/2009  . Anxiety   . Complication of anesthesia 01/31/2015   ? doesn't remember anything after being taken to pre surgery  . Coronary artery calcification seen on CAT scan 05/30/2009   moderate risk coronary calcium score by chest CT  . DDD (degenerative disc disease), lumbar   . DEPRESSION 06/13/2008  . DYSLIPIDEMIA 02/27/2009  . DYSPNEA 02/27/2009  . Dysrhythmia    Afib  . Frequency of urination   . GERD (gastroesophageal reflux disease)    "years ago"  . Head injury 2016   did not loose consciousness  . History of Bell's palsy   . History of DVT of  lower extremity 2006-- CHRONIC COUMADIN THERAPY  . History of hiatal hernia   . History of Lyme disease   . 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 Hypertension   . NON-HODGKIN'S LYMPHOMA, HX OF dx  2005--  chemoradiation completed 2006--  no recurrence   onocologist- dr odogwu--   . OSTEOARTHRITIS, MODERATE 04/07/2010  . Permanent atrial fibrillation    On chronic anti-coagulation with warfarin  . Pneumonia    x 3 - last time 02/2018  . Retroperitoneal fibrosis   . Stroke [facility])    ? - [place]- "you have had 3 strokes"   Past Surgical History:  Procedure Laterality Date  . BREAST MASS EXCISION     BENIGN--  RIGHT BREAST  . CARDIOVASCULAR STRESS TEST  04-04-2009-- PER PT ASYMPTOMATIC-- MEDICAL MANAGEMENT   STUDY WAS EQUIVOCAL/ EF 49%/ GLOBAL HYPOKINESIS/ APPEARS TO BE A MILD ANTERIOR PERFUSION DEFECT COULD REPRESENT ISCHEMIA BUT DUE TO  ACTIVITY WORSE IN STRESS THAN AT REST POSS. THE DEFECT WAS ARTIFACTUAL  . CARDIOVERSION N/A 07/11/2015   Procedure: CARDIOVERSION;  Surgeon: Larey Dresser, MD;  Location: [place];  Service: Cardiovascular;  Laterality: N/A;  . CT ANGIOGRAM  FEB 2011   CALCIUM SCORE 161/ POSS. MODERATE MID LAD STENOSIS  . CYSTOSCOPY W/ RETROGRADES  04/28/2011   Procedure: CYSTOSCOPY WITH RETROGRADE PYELOGRAM;  Surgeon: Fredricka Bonine, MD;  Location: [facility];  Service: Urology;  Laterality: Left;  . CYSTOSCOPY W/ URETERAL STENT REMOVAL  04/28/2011   Procedure: CYSTOSCOPY WITH STENT REMOVAL;  Surgeon: Fredricka Bonine, MD;  Location: [facility];  Service: Urology;;  . EXPLORATORY LAPAROTOMY  2005   LYMPHOMA  . EYE SURGERY Bilateral    cataract   . GAS INSERTION Right 03/31/2018   Procedure: RIGHT EYE INSERTION OF GAS C3F8;  Surgeon: Bernarda Caffey, MD;  Location: [place];  Service: Ophthalmology;  Laterality: Right;  . KNEE ARTHROSCOPY  2005   RIGHT  . LASER PHOTO ABLATION Right 03/31/2018    Procedure: RIGHT EYE LASER PHOTO ABLATION;  Surgeon: Bernarda Caffey, MD;  Location: [place];  Service: Ophthalmology;  Laterality: Right;  . MULTIPLE CYSTO/ LEFT URETERAL STENT EXCHANGES  LAST ONE 10-28-10  . RETROPERITONEAL BX  2007  . REVERSE SHOULDER ARTHROPLASTY Right 01/31/2015   Procedure: RIGHT REVERSE SHOULDER ARTHROPLASTY;  Surgeon: Justice Britain, MD;  Location: [place];  Service: Orthopedics;  Laterality: Right;  . TONSILLECTOMY  CHILD  . TOTAL KNEE ARTHROPLASTY  OCT 2010   RIGHT  . TOTAL KNEE ARTHROPLASTY  JAN 2010   LEFT  . TRANSTHORACIC ECHOCARDIOGRAM  [id number]   MILD SYSTOLIC DYSFUNCTION/ EF 21-30%/ MODERATE DIASTOLIC DYSFUCTION/ MILD MR/ MILD BIATRIAL ENLARGEMENT  . VITRECTOMY 25 GAUGE WITH SCLERAL BUCKLE Right 03/31/2018   Procedure: RIGHT VITRECTOMY 25 GAUGE WITH SCLERAL BUCKLE;  Surgeon: Bernarda Caffey, MD;  Location: [place];  Service: Ophthalmology;  Laterality: Right;    FAMILY HISTORY Family History  Problem Relation Age of Onset  . Arthritis Mother   . Heart disease Mother   . Heart attack Mother   . Diabetes Sister   . Hypertension Sister   . Stroke Sister   . Heart attack Sister     SOCIAL HISTORY Social History   Tobacco Use  . Smoking status: Former Smoker    Packs/day: 2.00    Years: 52.00    Pack years: 104.00    Types: Cigarettes    Last attempt to quit: 03/24/2008    Years since quitting: 10.1  . Smokeless tobacco: Never Used  Substance Use Topics  . Alcohol use: Not Currently    Comment: RARE  . Drug use: No         OPHTHALMIC EXAM:  Base Eye Exam    Visual Acuity (Snellen - Linear)      Right Left   Dist [place] 20/150-2 20/40   Dist ph [place] 20/80-2 20/30-3       Tonometry (Tonopen, 1:23 PM)      Right Left   Pressure 16 15       Pupils      Dark Light Shape React APD   Right 5 4 Round Minimal None   Left 3 2 Round Brisk  None       Visual Fields      Left Right    Full    Restrictions  Partial outer inferior temporal, inferior  nasal deficiencies       Extraocular Movement      Right Left    Full, Ortho Full, Ortho       Neuro/Psych    Oriented x3:  Yes   Mood/Affect:  Normal       Dilation    Both eyes:  1.0% Mydriacyl, 2.5% Phenylephrine @ 1:23 PM        Slit Lamp and Fundus Exam    External Exam      Right Left   External Periorbital edema        Slit Lamp Exam      Right Left   Lids/Lashes Dermatochalasis - upper lid; periorbital edema and ecchymosis Dermatochalasis - upper lid,  Mild Telangiectasia   Conjunctiva/Sclera Palmer ST quadrant, sutures intact; mild hooding superiorly White and quiet   Cornea Arcus, central epi defect -- closed, Well healed cataract wound, 3+ Punctate epithelial erosions Arcus, 2+ fine Punctate epithelial erosions   Anterior Chamber Deep, 1/2+cell/pigment Deep and quiet   Iris Round and dilated Round and dilated   Lens PC IOL in good position, 1+Posterior capsular opacification PC IOL with PC folds   Vitreous post vitrectomy; gas bubble 40-45% Posterior vitreous detachment, Vitreous syneresis       Fundus Exam      Right Left   Disc blot heme off disc Tilted disc, Peripapillary atrophy   C/D Ratio 0.5 0.3   Macula Flat, reattached, Blunted foveal reflex, RPE mottling and clumping, horizontal pigmented demarcation line inferior macula attached; drusen; RPE mottling and clumping, No heme or edema   Vessels Vascular attenuation Vascular attenuation   Periphery Retina attached over buckle, good 360 laser over buckle  Attached          IMAGING AND PROCEDURES  Imaging and Procedures for   OCT, Retina - OU - Both Eyes       Right Eye Quality was good. Central Foveal Thickness: 227. Progression has improved. Findings include normal foveal contour, no IRF, no SRF, outer retinal atrophy, epiretinal membrane.   Left Eye Quality was good. Central Foveal Thickness: 246. Progression has been stable. Findings include normal foveal contour, no IRF, no SRF, retinal  drusen , outer retinal atrophy, subretinal hyper-reflective material, epiretinal membrane (Thin choriod).   Notes *Images captured and stored on drive  Diagnosis / Impression:  OD: retina re-attached, diffuse outer retinal atrophy OS: Non-Exudative AMD -- stable from prior  Clinical management:  See below  Abbreviations: NFP - Normal foveal profile. CME - cystoid macular edema. PED - pigment epithelial detachment. IRF - intraretinal fluid. SRF - subretinal fluid. EZ - ellipsoid zone. ERM - epiretinal membrane. ORA - outer retinal atrophy. ORT - outer retinal tubulation. SRHM - subretinal hyper-reflective material                  ASSESSMENT/PLAN:    ICD-10-CM   1. Right retinal detachment H33.21   2. Retinal edema H35.81 OCT, Retina - OU - Both Eyes  3. Intermediate stage nonexudative age-related macular degeneration of both eyes H35.3132   4. Combined form of age-related cataract, both eyes H25.813     1. Retinal detachment, OD-  - initial presentation: macula- and fovea-involving rhegmatogenous retinal detachment - initial OCT showed IRF/cystic changes suggestive of chronic detachment - supero-temporal detachment from 0730 to  1200 with small horseshoe tear located at 1030 and large horseshoe tear located at 1100 - s/p pneumatic cryopexy OD (02.04.19) - was doing well post pneumatic and then was lost to f/u from 4.23.19 - 1.3.20, pt presents urgently with nasal hemisphere field defect and found to have recurrent macula-involving, fovea-splitting, temporal retinal detachment - no frank tear noted but SRF extends from old cryo scar (1000 - 0700, temporal quad) - interestingly BCVA was 20/50 OD on 1.3.20 despite detachment - now POW8 s/p SBP (41 band) + PPV/PFO/EL/FAX/14% C3F8 OD, 1.9.20  - retina reattached and in good position             - good gas bubble in place -- 40-45%  - choroidal hemorrhages remain resolved  - BCVA 20/80-2             - IOP okat 16              - dec  PF TID OD   Cosopt QdailyOD                         PSO ung QID OD -- PRN/bedtime             - avoid laying flat on back             - post op drop and positioning instructions reviewed             - tylenol/ibuprofen for pain             - Rx given for breakthrough pain - f/u 4 weeks  2. Retinal edema  - detached retina had cystic changes / IRF suggestive of chronic detachment both times  3. Age related macular degeneration, non-exudative, both eyes  - The incidence, anatomy, and pathology of dry AMD, risk of progression, and the AREDS and AREDS 2 study including smoking risks discussed with patient.  - Recommend amsler grid monitoring  - continue present management  4. Pseudophakia OU  - s/p CE/IOL OU  - beautiful surgeries, doing well -- performed by Dr. Read Drivers, November 2019  - monitor    Ophthalmic Meds Ordered this visit:  Meds ordered this encounter  Medications  . prednisoLONE acetate (PRED FORTE) 1 % ophthalmic suspension    Sig: Place 1 drop into the right eye 3 (three) times daily.    Dispense:  10 mL    Refill:  1       Return in about 4 weeks (around 06/14/2018) for f/u RD OD, DFE, OCT.  There are no Patient Instructions on file for this visit.   Explained the diagnoses, plan, and follow up with the patient and they expressed understanding.  Patient expressed understanding of the importance of proper follow up care.   This document serves as a record of services personally performed by Gardiner Sleeper, MD, PhD. It was created on their behalf by Ernest Mallick, OA, an ophthalmic assistant. The creation of this record is the provider's dictation and/or activities during the visit.    Electronically signed by: Ernest Mallick, OA 02.24.2020 10:43 PM    Gardiner Sleeper, M.D., Ph.D. Diseases & Surgery of the Retina and Vitreous [facility]  I have reviewed the above documentation for accuracy and completeness, and I agree  with the above. Gardiner Sleeper, M.D., Ph.D. 05/18/18 10:43 PM    Abbreviations: M myopia (nearsighted); A astigmatism; H hyperopia (farsighted); P presbyopia; Mrx spectacle  prescription;  CTL contact lenses; OD right eye; OS left eye; OU both eyes  XT exotropia; ET esotropia; PEK punctate epithelial keratitis; PEE punctate epithelial erosions; DES dry eye syndrome; MGD meibomian gland dysfunction; ATs artificial tears; PFAT's preservative free artificial tears; [place] nuclear sclerotic cataract; PSC posterior subcapsular cataract; ERM epi-retinal membrane; PVD posterior vitreous detachment; RD retinal detachment; DM diabetes mellitus; DR diabetic retinopathy; NPDR non-proliferative diabetic retinopathy; PDR proliferative diabetic retinopathy; CSME clinically significant macular edema; DME diabetic macular edema; dbh dot blot hemorrhages; CWS cotton wool spot; POAG primary open angle glaucoma; C/D cup-to-disc ratio; HVF humphrey visual field; GVF goldmann visual field; OCT optical coherence tomography; IOP intraocular pressure; BRVO Branch retinal vein occlusion; CRVO central retinal vein occlusion; CRAO central retinal artery occlusion; BRAO branch retinal artery occlusion; RT retinal tear; SB scleral buckle; PPV pars plana vitrectomy; VH Vitreous hemorrhage; PRP panretinal laser photocoagulation; IVK intravitreal kenalog; VMT vitreomacular traction; MH Macular hole;  NVD neovascularization of the disc; NVE neovascularization elsewhere; AREDS age related eye disease study; ARMD age related macular degeneration; POAG primary open angle glaucoma; EBMD epithelial/anterior basement membrane dystrophy; ACIOL anterior chamber intraocular lens; IOL intraocular lens; PCIOL posterior chamber intraocular lens; Phaco/IOL phacoemulsification with intraocular lens placement; [place] photorefractive keratectomy; LASIK laser assisted in situ keratomileusis; HTN hypertension; DM diabetes mellitus; COPD chronic obstructive pulmonary  disease

## 2018-05-17 ENCOUNTER — Ambulatory Visit (INDEPENDENT_AMBULATORY_CARE_PROVIDER_SITE_OTHER): Payer: Medicare Other | Admitting: Ophthalmology

## 2018-05-17 ENCOUNTER — Encounter (INDEPENDENT_AMBULATORY_CARE_PROVIDER_SITE_OTHER): Payer: Self-pay | Admitting: Ophthalmology

## 2018-05-17 DIAGNOSIS — H25813 Combined forms of age-related cataract, bilateral: Secondary | ICD-10-CM

## 2018-05-17 DIAGNOSIS — H353132 Nonexudative age-related macular degeneration, bilateral, intermediate dry stage: Secondary | ICD-10-CM

## 2018-05-17 DIAGNOSIS — H3581 Retinal edema: Secondary | ICD-10-CM

## 2018-05-17 DIAGNOSIS — H3321 Serous retinal detachment, right eye: Secondary | ICD-10-CM

## 2018-05-17 MED ORDER — PREDNISOLONE ACETATE 1 % OP SUSP: 1.0000 [drp] | Freq: Three times a day (TID) | OPHTHALMIC | 1 refills | Status: DC

## 2018-05-18 ENCOUNTER — Encounter (INDEPENDENT_AMBULATORY_CARE_PROVIDER_SITE_OTHER): Payer: Self-pay | Admitting: Ophthalmology

## 2018-05-20 ENCOUNTER — Telehealth: Payer: Self-pay

## 2018-05-20 ENCOUNTER — Other Ambulatory Visit: Payer: Self-pay | Admitting: Cardiology

## 2018-05-20 NOTE — Telephone Encounter (Signed)
Called pt left msg regarding the refill request for coumadin and I stated that It wont be refilled until they are seen in the office

## 2018-05-20 NOTE — Telephone Encounter (Signed)
Called and left msg that they need to come in for an appt to get the refill

## 2018-05-24 NOTE — Telephone Encounter (Signed)
Left a message for the pt to call back. Pt is overdue for follow-up INR appt.

## 2018-05-31 NOTE — Telephone Encounter (Signed)
°*  STAT* If patient is at the pharmacy, call can be transferred to refill team.   1. Which medications need to be refilled? (please list name of each medication and dose if known) Warfarin- she has an appt on 06-13-18  2. Which pharmacy/location (including street and city if local pharmacy) is medication to be sent to? [facility] [phone]  3. Do they need a 30 day or 90 day supply? Enough until th 23rd of March

## 2018-06-09 ENCOUNTER — Other Ambulatory Visit: Payer: Self-pay | Admitting: Family Medicine

## 2018-06-13 ENCOUNTER — Telehealth: Payer: Self-pay | Admitting: *Deleted

## 2018-06-14 ENCOUNTER — Encounter (INDEPENDENT_AMBULATORY_CARE_PROVIDER_SITE_OTHER): Payer: Medicare Other | Admitting: Ophthalmology

## 2018-06-14 ENCOUNTER — Telehealth: Payer: Self-pay

## 2018-06-14 ENCOUNTER — Other Ambulatory Visit: Payer: Self-pay | Admitting: Cardiology

## 2018-06-14 MED ORDER — APIXABAN 2.5 MG PO TABS: 2.5000 mg | ORAL_TABLET | Freq: Two times a day (BID) | ORAL | 5 refills | Status: DC

## 2018-06-14 NOTE — Telephone Encounter (Signed)

## 2018-06-14 NOTE — Telephone Encounter (Signed)
Pt interested in switching to DOAC therapy - Eliquis $47 per month. This is affordable for pt. SCr 1.36 in January, age 73, weight 102kg, CrCl 51mL/min. She will keep INR check on Thursday and will plan to transition to Eliquis 2.5mg  BID for recurrent VTE prevention dosing.

## 2018-06-14 NOTE — Addendum Note (Signed)
Addended by: Avett Reineck E on: 06/14/2018 12:29 PM   Modules accepted: Orders

## 2018-06-15 NOTE — Progress Notes (Addendum)
CHIEF COMPLAINT Patient presents for Post-op Follow-up   HISTORY OF PRESENT ILLNESS: [name] is a 73 y.o. female who presents to the clinic today for:   HPI    Post-op Follow-up    In right eye.  I, the attending physician,  performed the HPI with the patient and updated documentation appropriately.          Comments    2 week retina eval.  Patient states her vision is stable.  Does not feel like her vision in her right eye is changing.  Patient denies eye pain or discomfort and denies new or worsening floaters or fol OU.       Last edited by Bernarda Caffey, MD on 06/17/2018  9:04 AM. (History)    pt states her gas bubble is still there, but it is very small, she states she keeps her right eye closed a lot but the vision annoys her when she has both eyes open   Referring physician: [name], MD [place], [place] [address]  HISTORICAL INFORMATION:   Selected notes from the [facility] Referred by Dr. Delman Cheadle for concern of mac off RD LEE- 02.04.19  Ocular Hx-  PMH- A-Fib on coumadin, severe anxiety, CAD, depression    CURRENT MEDICATIONS: Current Outpatient Medications (Ophthalmic Drugs)  Medication Sig  . atropine 1 % ophthalmic ointment Place 1 application into the right eye 4 (four) times daily.  . dorzolamide-timolol (COSOPT) 22.3-6.8 MG/ML ophthalmic solution Place 1 drop into the right eye 4 (four) times daily.  Marland Kitchen gatifloxacin (ZYMAXID) 0.5 % SOLN Place 1 drop into the right eye 4 (four) times daily.  . prednisoLONE acetate (PRED FORTE) 1 % ophthalmic suspension Place 1 drop into the right eye 3 (three) times daily.   No current facility-administered medications for this visit.  (Ophthalmic Drugs)   Current Outpatient Medications (Other)  Medication Sig  . ALPRAZolam (XANAX) 1 MG tablet Take 1 tablet (1 mg total) by mouth 3 (three) times daily as needed.  Marland Kitchen apixaban (ELIQUIS) 2.5 MG TABS tablet Take 1 tablet (2.5 mg total) by  mouth 2 (two) times daily.  Marland Kitchen b complex vitamins capsule Take 2 capsules by mouth daily.   . Biotin 5 MG CAPS Take 1 capsule by mouth daily.   Marland Kitchen buPROPion (WELLBUTRIN XL) 300 MG 24 hr tablet TAKE 1 TABLET BY MOUTH EVERY DAY (Patient taking differently: Take 300 mg by mouth daily. )  . Cholecalciferol (VITAMIN D) 2000 UNITS CAPS Take 2,000 Int'l Units by mouth.  . Coenzyme Q10-Fish Oil-Vit E (CO-Q 10 OMEGA-3 FISH OIL PO) Take 100 mg by mouth daily.   Marland Kitchen diltiazem (CARDIZEM CD) 180 MG 24 hr capsule Take 1 capsule (180 mg total) by mouth daily.  . DULoxetine (CYMBALTA) 60 MG capsule TAKE 1 CAPSULE BY MOUTH EVERY DAY (Patient taking differently: Take 60 mg by mouth daily. )  . fluticasone (FLONASE) 50 MCG/ACT nasal spray instill 2 sprays into each nostril once daily (Patient taking differently: Place 2 sprays into both nostrils daily as needed for allergies. )  . furosemide (LASIX) 20 MG tablet TAKE 1 TABLET (20 MG TOTAL) BY MOUTH DAILY. PLEASE KEEP UPCOMING APPOINTMENT FOR FURTHER REFILLS  . HYDROcodone-acetaminophen (NORCO/VICODIN) 5-325 MG tablet Take 1 tablet by mouth every 4 (four) hours as needed for moderate pain.  Marland Kitchen LIVALO 2 MG TABS TAKE 1 TABLET BY MOUTH EVERY DAY (Patient taking differently: Take 2 mg by mouth daily. )  . loratadine (CLARITIN) 10  MG tablet Take 10 mg by mouth daily.   Marland Kitchen losartan (COZAAR) 25 MG tablet TAKE 1 TABLET EVERY DAY (Patient taking differently: Take 25 mg by mouth daily. )  . Lysine 500 MG TABS Take 500 mg by mouth daily.  . metoprolol succinate (TOPROL-XL) 25 MG 24 hr tablet TAKE 1 TABLET BY MOUTH EVERY DAY  . nitroGLYCERIN (NITROSTAT) 0.4 MG SL tablet place 1 tablet under the tongue every 5 minutes for UP TO 3 doses if needed  . ondansetron (ZOFRAN) 4 MG tablet Take 1 tablet (4 mg total) by mouth every 8 (eight) hours as needed for nausea or vomiting.  . valACYclovir (VALTREX) 1000 MG tablet Take 2 tablets at onset of cold sores and repeat 2 in 12 hours. (Patient  taking differently: Take 2,000 mg by mouth See admin instructions. Take 2 tablets at onset of cold sores and repeat 2 in 12 hours.)   No current facility-administered medications for this visit.  (Other)      REVIEW OF SYSTEMS: ROS    Positive for: Musculoskeletal, Eyes   Negative for: Constitutional, Gastrointestinal, Neurological, Skin, Genitourinary, HENT, Endocrine, Cardiovascular, Respiratory, Psychiatric, Allergic/Imm, Heme/Lymph   Last edited by Doneen Poisson on 06/17/2018  8:13 AM. (History)       ALLERGIES Allergies  Allergen Reactions  . Chlorhexidine Base Itching    CHG WIPES  . Penicillins Hives and Rash    DID THE REACTION INVOLVE: Swelling of the face/tongue/throat, SOB, or low BP? No Sudden or severe rash/hives, skin peeling, or the inside of the mouth or nose? No Did it require medical treatment? No When did it last happen?90s If all above answers are "NO", may proceed with cephalosporin use.     PAST MEDICAL HISTORY Past Medical History:  Diagnosis Date  . ABDOMINAL PAIN RIGHT UPPER QUADRANT 11/08/2008  . ALLERGIC RHINITIS 08/01/2009  . Anxiety   . Complication of anesthesia 01/31/2015   ? doesn't remember anything after being taken to pre surgery  . Coronary artery calcification seen on CAT scan 05/30/2009   moderate risk coronary calcium score by chest CT  . DDD (degenerative disc disease), lumbar   . DEPRESSION 06/13/2008  . DYSLIPIDEMIA 02/27/2009  . DYSPNEA 02/27/2009  . Dysrhythmia    Afib  . Frequency of urination   . GERD (gastroesophageal reflux disease)    "years ago"  . Head injury 2016   did not loose consciousness  . History of Bell's palsy   . History of DVT of lower extremity 2006-- CHRONIC COUMADIN THERAPY  . History of hiatal hernia   . History of Lyme disease   . 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 Hypertension   . NON-HODGKIN'S LYMPHOMA, HX OF dx  2005--  chemoradiation completed 2006--   no recurrence   onocologist- dr odogwu--   . OSTEOARTHRITIS, MODERATE 04/07/2010  . Permanent atrial fibrillation    On chronic anti-coagulation with warfarin  . Pneumonia    x 3 - last time 02/2018  . Retroperitoneal fibrosis   . Stroke [facility])    ? - [place]- "you have had 3 strokes"   Past Surgical History:  Procedure Laterality Date  . BREAST MASS EXCISION     BENIGN-- RIGHT BREAST  . CARDIOVASCULAR STRESS TEST  04-04-2009-- PER PT ASYMPTOMATIC-- MEDICAL MANAGEMENT   STUDY WAS EQUIVOCAL/ EF 49%/ GLOBAL HYPOKINESIS/ APPEARS TO BE A MILD ANTERIOR PERFUSION DEFECT COULD REPRESENT ISCHEMIA BUT DUE TO  ACTIVITY WORSE IN STRESS THAN AT  REST POSS. THE DEFECT WAS ARTIFACTUAL  . CARDIOVERSION N/A 07/11/2015   Procedure: CARDIOVERSION;  Surgeon: Larey Dresser, MD;  Location: [facility];  Service: Cardiovascular;  Laterality: N/A;  . CT ANGIOGRAM  FEB 2011   CALCIUM SCORE 161/ POSS. MODERATE MID LAD STENOSIS  . CYSTOSCOPY W/ RETROGRADES  04/28/2011   Procedure: CYSTOSCOPY WITH RETROGRADE PYELOGRAM;  Surgeon: Fredricka Bonine, MD;  Location: [facility];  Service: Urology;  Laterality: Left;  . CYSTOSCOPY W/ URETERAL STENT REMOVAL  04/28/2011   Procedure: CYSTOSCOPY WITH STENT REMOVAL;  Surgeon: Fredricka Bonine, MD;  Location: [facility];  Service: Urology;;  . EXPLORATORY LAPAROTOMY  2005   LYMPHOMA  . EYE SURGERY Bilateral    cataract   . GAS INSERTION Right 03/31/2018   Procedure: RIGHT EYE INSERTION OF GAS C3F8;  Surgeon: Bernarda Caffey, MD;  Location: [facility];  Service: Ophthalmology;  Laterality: Right;  . KNEE ARTHROSCOPY  2005   RIGHT  . LASER PHOTO ABLATION Right 03/31/2018   Procedure: RIGHT EYE LASER PHOTO ABLATION;  Surgeon: Bernarda Caffey, MD;  Location: [facility];  Service: Ophthalmology;  Laterality: Right;  . MULTIPLE CYSTO/ LEFT URETERAL STENT EXCHANGES  LAST ONE 10-28-10  . RETROPERITONEAL BX  2007  . REVERSE SHOULDER ARTHROPLASTY Right  01/31/2015   Procedure: RIGHT REVERSE SHOULDER ARTHROPLASTY;  Surgeon: Justice Britain, MD;  Location: [facility];  Service: Orthopedics;  Laterality: Right;  . TONSILLECTOMY  CHILD  . TOTAL KNEE ARTHROPLASTY  OCT 2010   RIGHT  . TOTAL KNEE ARTHROPLASTY  JAN 2010   LEFT  . TRANSTHORACIC ECHOCARDIOGRAM  [id number]   MILD SYSTOLIC DYSFUNCTION/ EF 29-56%/ MODERATE DIASTOLIC DYSFUCTION/ MILD MR/ MILD BIATRIAL ENLARGEMENT  . VITRECTOMY 25 GAUGE WITH SCLERAL BUCKLE Right 03/31/2018   Procedure: RIGHT VITRECTOMY 25 GAUGE WITH SCLERAL BUCKLE;  Surgeon: Bernarda Caffey, MD;  Location: [facility];  Service: Ophthalmology;  Laterality: Right;    FAMILY HISTORY Family History  Problem Relation Age of Onset  . Arthritis Mother   . Heart disease Mother   . Heart attack Mother   . Diabetes Sister   . Hypertension Sister   . Stroke Sister   . Heart attack Sister     SOCIAL HISTORY Social History   Tobacco Use  . Smoking status: Former Smoker    Packs/day: 2.00    Years: 52.00    Pack years: 104.00    Types: Cigarettes    Last attempt to quit: 03/24/2008    Years since quitting: 10.2  . Smokeless tobacco: Never Used  Substance Use Topics  . Alcohol use: Not Currently    Comment: RARE  . Drug use: No         OPHTHALMIC EXAM:  Base Eye Exam    Visual Acuity (Snellen - Linear)      Right Left   Dist [place] 20/150 -2 20/40 -1   Dist ph [place] 20/70 +2 20/30 +1       Tonometry (Tonopen, 8:18 AM)      Right Left   Pressure 14 15       Pupils      Dark Light Shape React APD   Right 5 4 Irregular Minimal 0   Left 4 3.5 Round Brisk 0       Extraocular Movement      Right Left    Full Full       Neuro/Psych    Oriented x3:  Yes   Mood/Affect:  Normal  Dilation    Both eyes:  1.0% Mydriacyl, 2.5% Phenylephrine @ 8:18 AM        Slit Lamp and Fundus Exam    Slit Lamp Exam      Right Left   Lids/Lashes Dermatochalasis - upper lid Dermatochalasis - upper lid,  Mild Telangiectasia    Conjunctiva/Sclera White and quiet White and quiet   Cornea Arcus, Well healed cataract wound, 1-2+ Punctate epithelial erosions Arcus, 2+ fine Punctate epithelial erosions   Anterior Chamber Deep and quiet Deep and quiet   Iris Round and dilated Round and dilated   Lens PC IOL in good position, 1+Posterior capsular opacification PC IOL with PC folds   Vitreous post vitrectomy; superior gas bubble 5-10% Posterior vitreous detachment, Vitreous syneresis       Fundus Exam      Right Left   Disc disc heme at 0300, mild Pallor, temporal Peripapillary atrophy Tilted disc, Peripapillary atrophy   C/D Ratio 0.5 0.3   Macula Flat, reattached, Blunted foveal reflex, RPE mottling and clumping, drusen, horizontal pigmented demarcation line inferior macula attached; drusen; RPE mottling and clumping, No heme or edema   Vessels Vascular attenuation Vascular attenuation   Periphery Retina attached over buckle, good buckle height and 360 laser over buckle; superior cryo scars Attached          IMAGING AND PROCEDURES  Imaging and Procedures for   OCT, Retina - OU - Both Eyes       Right Eye Quality was good. Central Foveal Thickness: 227. Progression has been stable. Findings include normal foveal contour, no IRF, no SRF, outer retinal atrophy, epiretinal membrane, retinal drusen  (Mild improvement in ellipsoid signal).   Left Eye Quality was good. Central Foveal Thickness: 246. Progression has been stable. Findings include normal foveal contour, no IRF, no SRF, retinal drusen , outer retinal atrophy, subretinal hyper-reflective material, epiretinal membrane (Thin choriod).   Notes *Images captured and stored on drive  Diagnosis / Impression:  OD: retina re-attached, mild improvement in ellipsoid signal OS: Non-Exudative AMD -- stable from prior  Clinical management:  See below  Abbreviations: NFP - Normal foveal profile. CME - cystoid macular edema. PED - pigment epithelial detachment.  IRF - intraretinal fluid. SRF - subretinal fluid. EZ - ellipsoid zone. ERM - epiretinal membrane. ORA - outer retinal atrophy. ORT - outer retinal tubulation. SRHM - subretinal hyper-reflective material                  ASSESSMENT/PLAN:    ICD-10-CM   1. Right retinal detachment H33.21   2. Retinal edema H35.81 OCT, Retina - OU - Both Eyes  3. Intermediate stage nonexudative age-related macular degeneration of both eyes H35.3132   4. Combined form of age-related cataract, both eyes H25.813     1. Retinal detachment, OD-  - initial presentation: macula- and fovea-involving rhegmatogenous retinal detachment - initial OCT showed IRF/cystic changes suggestive of chronic detachment - supero-temporal detachment from 0730 to 1200 with small horseshoe tear located at 1030 and large horseshoe tear located at 1100 - s/p pneumatic cryopexy OD (02.04.19) - was doing well post pneumatic and then was lost to f/u from 4.23.19 - 1.3.20, pt presents urgently with nasal hemisphere field defect and found to have recurrent macula-involving, fovea-splitting, temporal retinal detachment - no frank tear noted but SRF extends from old cryo scar (1000 - 0700, temporal quad) - interestingly BCVA was 20/50 OD on 1.3.20 despite detachment - now POW11 s/p SBP (41 band) + PPV/PFO/EL/FAX/14%  C3F8 OD, 1.9.20  - retina reattached and in good position             - gas bubble 5-10%  - choroidal hemorrhages remain resolved  - BCVA 20/70+2             - IOP okat 14             - start PF taper BID ODx2 week, Qdaily x2 week, then stop  - Cosopt Qdaily OD -- okay to stop  - PSO ung PRN/bedtime-- okay to stop             - avoid laying flat on back             - post op drop and positioning instructions reviewed - f/u 2 months, sooner prn  2. Retinal edema  - detached retina had cystic changes / IRF suggestive of chronic detachment both times  3. Age related macular degeneration, non-exudative, both  eyes  - The incidence, anatomy, and pathology of dry AMD, risk of progression, and the AREDS and AREDS 2 study including smoking risks discussed with patient.  - Recommend amsler grid monitoring  - continue present management  4. Pseudophakia OU  - s/p CE/IOL OU  - beautiful surgeries, doing well -- performed by Dr. Read Drivers, November 2019  - monitor   Ophthalmic Meds Ordered this visit:  No orders of the defined types were placed in this encounter.      Return in about 2 months (around 08/17/2018) for POV RD OD, DFE, OCT.  There are no Patient Instructions on file for this visit.   Explained the diagnoses, plan, and follow up with the patient and they expressed understanding.  Patient expressed understanding of the importance of proper follow up care.   This document serves as a record of services personally performed by Gardiner Sleeper, MD, PhD. It was created on their behalf by Ernest Mallick, OA, an ophthalmic assistant. The creation of this record is the provider's dictation and/or activities during the visit.    Electronically signed by: Ernest Mallick, OA  03.25.2020 11:38 PM    Gardiner Sleeper, M.D., Ph.D. Diseases & Surgery of the Retina and Vitreous [facility]  I have reviewed the above documentation for accuracy and completeness, and I agree with the above. Gardiner Sleeper, M.D., Ph.D. 06/17/18 11:38 PM     Abbreviations: M myopia (nearsighted); A astigmatism; H hyperopia (farsighted); P presbyopia; Mrx spectacle prescription;  CTL contact lenses; OD right eye; OS left eye; OU both eyes  XT exotropia; ET esotropia; PEK punctate epithelial keratitis; PEE punctate epithelial erosions; DES dry eye syndrome; MGD meibomian gland dysfunction; ATs artificial tears; PFAT's preservative free artificial tears; [place] nuclear sclerotic cataract; PSC posterior subcapsular cataract; ERM epi-retinal membrane; PVD posterior vitreous detachment; RD retinal  detachment; DM diabetes mellitus; DR diabetic retinopathy; NPDR non-proliferative diabetic retinopathy; PDR proliferative diabetic retinopathy; CSME clinically significant macular edema; DME diabetic macular edema; dbh dot blot hemorrhages; CWS cotton wool spot; POAG primary open angle glaucoma; C/D cup-to-disc ratio; HVF humphrey visual field; GVF goldmann visual field; OCT optical coherence tomography; IOP intraocular pressure; BRVO Branch retinal vein occlusion; CRVO central retinal vein occlusion; CRAO central retinal artery occlusion; BRAO branch retinal artery occlusion; RT retinal tear; SB scleral buckle; PPV pars plana vitrectomy; VH Vitreous hemorrhage; PRP panretinal laser photocoagulation; IVK intravitreal kenalog; VMT vitreomacular traction; MH Macular hole;  NVD neovascularization of the disc; NVE neovascularization elsewhere; AREDS  age related eye disease study; ARMD age related macular degeneration; POAG primary open angle glaucoma; EBMD epithelial/anterior basement membrane dystrophy; ACIOL anterior chamber intraocular lens; IOL intraocular lens; PCIOL posterior chamber intraocular lens; Phaco/IOL phacoemulsification with intraocular lens placement; [place] photorefractive keratectomy; LASIK laser assisted in situ keratomileusis; HTN hypertension; DM diabetes mellitus; COPD chronic obstructive pulmonary disease

## 2018-06-16 ENCOUNTER — Other Ambulatory Visit: Payer: Self-pay

## 2018-06-16 ENCOUNTER — Telehealth: Payer: Self-pay | Admitting: Cardiology

## 2018-06-16 ENCOUNTER — Ambulatory Visit (INDEPENDENT_AMBULATORY_CARE_PROVIDER_SITE_OTHER): Payer: Medicare Other | Admitting: Pharmacist

## 2018-06-16 DIAGNOSIS — Z7901 Long term (current) use of anticoagulants: Secondary | ICD-10-CM | POA: Diagnosis not present

## 2018-06-16 DIAGNOSIS — I824Y9 Acute embolism and thrombosis of unspecified deep veins of unspecified proximal lower extremity: Secondary | ICD-10-CM

## 2018-06-16 DIAGNOSIS — Z5181 Encounter for therapeutic drug level monitoring: Secondary | ICD-10-CM | POA: Diagnosis not present

## 2018-06-16 LAB — POCT INR: INR: 2.5 (ref 2.0–3.0)

## 2018-06-16 NOTE — Telephone Encounter (Signed)
Attempted to return call and unable to reach at number provided.

## 2018-06-16 NOTE — Telephone Encounter (Signed)
Pt called returning the message from the Coumadin clinic. She tried to return the call, but the [facility] Clinic back, but they were only in the office until 4pm. She thinks it was about instructions to change her medication. If so, please contact her son, Herbie Baltimore, who is listed as the contact information on this note. She will be at another appointment most of the day tomorrow and will not be able to answer her phone.

## 2018-06-17 ENCOUNTER — Encounter (INDEPENDENT_AMBULATORY_CARE_PROVIDER_SITE_OTHER): Payer: Self-pay | Admitting: Ophthalmology

## 2018-06-17 ENCOUNTER — Ambulatory Visit (INDEPENDENT_AMBULATORY_CARE_PROVIDER_SITE_OTHER): Payer: Medicare Other | Admitting: Ophthalmology

## 2018-06-17 DIAGNOSIS — H3581 Retinal edema: Secondary | ICD-10-CM

## 2018-06-17 DIAGNOSIS — H353132 Nonexudative age-related macular degeneration, bilateral, intermediate dry stage: Secondary | ICD-10-CM

## 2018-06-17 DIAGNOSIS — H3321 Serous retinal detachment, right eye: Secondary | ICD-10-CM

## 2018-06-17 DIAGNOSIS — H25813 Combined forms of age-related cataract, bilateral: Secondary | ICD-10-CM

## 2018-06-20 NOTE — Telephone Encounter (Signed)
See coag encounter from 06/16/18

## 2018-06-22 ENCOUNTER — Other Ambulatory Visit: Payer: Self-pay | Admitting: Cardiology

## 2018-06-22 ENCOUNTER — Ambulatory Visit (INDEPENDENT_AMBULATORY_CARE_PROVIDER_SITE_OTHER): Payer: Medicare Other | Admitting: Family Medicine

## 2018-06-22 ENCOUNTER — Ambulatory Visit: Payer: Self-pay | Admitting: *Deleted

## 2018-06-22 ENCOUNTER — Ambulatory Visit: Payer: Medicare Other | Admitting: Family Medicine

## 2018-06-22 ENCOUNTER — Other Ambulatory Visit: Payer: Self-pay

## 2018-06-22 DIAGNOSIS — M79604 Pain in right leg: Secondary | ICD-10-CM | POA: Diagnosis not present

## 2018-06-22 DIAGNOSIS — M79605 Pain in left leg: Secondary | ICD-10-CM

## 2018-06-22 NOTE — Progress Notes (Signed)
Patient ID: [name], female   DOB: 08-Dec-1945, 73 y.o.   MRN: [id number]  Virtual Visit via Video Note  I connected with [name] on 06/22/18 at  2:15 PM EDT by a video enabled telemedicine application and verified that I am speaking with the correct person using two identifiers.  With some technical difficulties with WebEx.  Location patient: home Location provider:work or home office Persons participating in the virtual visit: patient, provider  I discussed the limitations of evaluation and management by telemedicine and the availability of in person appointments. The patient expressed understanding and agreed to proceed.   HPI: Patient called complaining of increased achiness in both legs for the past several days.  She denies any leg cramps.  Her symptoms are mostly at night when she is in bed.  She is not describing any claudication type symptoms.  She does not notice any color temperature changes in her legs or lower extremities.  She states pain is moderate.  She cannot take nonsteroidals because of chronic therapy with Eliquis.  She does take statin with Livalo but has not had any general myalgias with this in the past.  She has had previous intolerance with multiple other statins.  She has multiple chronic problems including history of obesity, atrial fibrillation, hypertension, remote history of DVT, diastolic heart failure, osteoarthritis involving multiple joints.  She does have some chronic low back pain but does not describe any lumbar stenosis type symptoms such as pain with ambulation   ROS: See pertinent positives and negatives per HPI.  Past Medical History:  Diagnosis Date  . ABDOMINAL PAIN RIGHT UPPER QUADRANT 11/08/2008  . ALLERGIC RHINITIS 08/01/2009  . Anxiety   . Complication of anesthesia 01/31/2015   ? doesn't remember anything after being taken to pre surgery  . Coronary artery calcification seen on CAT scan 05/30/2009   moderate risk coronary  calcium score by chest CT  . DDD (degenerative disc disease), lumbar   . DEPRESSION 06/13/2008  . DYSLIPIDEMIA 02/27/2009  . DYSPNEA 02/27/2009  . Dysrhythmia    Afib  . Frequency of urination   . GERD (gastroesophageal reflux disease)    "years ago"  . Head injury 2016   did not loose consciousness  . History of Bell's palsy   . History of DVT of lower extremity 2006-- CHRONIC COUMADIN THERAPY  . History of hiatal hernia   . History of Lyme disease   . 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 Hypertension   . NON-HODGKIN'S LYMPHOMA, HX OF dx  2005--  chemoradiation completed 2006--  no recurrence   onocologist- dr odogwu--   . OSTEOARTHRITIS, MODERATE 04/07/2010  . Permanent atrial fibrillation    On chronic anti-coagulation with warfarin  . Pneumonia    x 3 - last time 02/2018  . Retroperitoneal fibrosis   . Stroke [facility])    ? - [place]- "you have had 3 strokes"    Past Surgical History:  Procedure Laterality Date  . BREAST MASS EXCISION     BENIGN-- RIGHT BREAST  . CARDIOVASCULAR STRESS TEST  04-04-2009-- PER PT ASYMPTOMATIC-- MEDICAL MANAGEMENT   STUDY WAS EQUIVOCAL/ EF 49%/ GLOBAL HYPOKINESIS/ APPEARS TO BE A MILD ANTERIOR PERFUSION DEFECT COULD REPRESENT ISCHEMIA BUT DUE TO  ACTIVITY WORSE IN STRESS THAN AT REST POSS. THE DEFECT WAS ARTIFACTUAL  . CARDIOVERSION N/A 07/11/2015   Procedure: CARDIOVERSION;  Surgeon: Larey Dresser, MD;  Location: [place];  Service: Cardiovascular;  Laterality: N/A;  .  CT ANGIOGRAM  FEB 2011   CALCIUM SCORE 161/ POSS. MODERATE MID LAD STENOSIS  . CYSTOSCOPY W/ RETROGRADES  04/28/2011   Procedure: CYSTOSCOPY WITH RETROGRADE PYELOGRAM;  Surgeon: Fredricka Bonine, MD;  Location: [facility];  Service: Urology;  Laterality: Left;  . CYSTOSCOPY W/ URETERAL STENT REMOVAL  04/28/2011   Procedure: CYSTOSCOPY WITH STENT REMOVAL;  Surgeon: Fredricka Bonine, MD;  Location: [facility];  Service: Urology;;  . EXPLORATORY LAPAROTOMY  2005   LYMPHOMA  . EYE SURGERY Bilateral    cataract   . GAS INSERTION Right 03/31/2018   Procedure: RIGHT EYE INSERTION OF GAS C3F8;  Surgeon: Bernarda Caffey, MD;  Location: [place];  Service: Ophthalmology;  Laterality: Right;  . KNEE ARTHROSCOPY  2005   RIGHT  . LASER PHOTO ABLATION Right 03/31/2018   Procedure: RIGHT EYE LASER PHOTO ABLATION;  Surgeon: Bernarda Caffey, MD;  Location: [place];  Service: Ophthalmology;  Laterality: Right;  . MULTIPLE CYSTO/ LEFT URETERAL STENT EXCHANGES  LAST ONE 10-28-10  . RETROPERITONEAL BX  2007  . REVERSE SHOULDER ARTHROPLASTY Right 01/31/2015   Procedure: RIGHT REVERSE SHOULDER ARTHROPLASTY;  Surgeon: Justice Britain, MD;  Location: [place];  Service: Orthopedics;  Laterality: Right;  . TONSILLECTOMY  CHILD  . TOTAL KNEE ARTHROPLASTY  OCT 2010   RIGHT  . TOTAL KNEE ARTHROPLASTY  JAN 2010   LEFT  . TRANSTHORACIC ECHOCARDIOGRAM  [id number]   MILD SYSTOLIC DYSFUNCTION/ EF 96-75%/ MODERATE DIASTOLIC DYSFUCTION/ MILD MR/ MILD BIATRIAL ENLARGEMENT  . VITRECTOMY 25 GAUGE WITH SCLERAL BUCKLE Right 03/31/2018   Procedure: RIGHT VITRECTOMY 25 GAUGE WITH SCLERAL BUCKLE;  Surgeon: Bernarda Caffey, MD;  Location: [place];  Service: Ophthalmology;  Laterality: Right;    Family History  Problem Relation Age of Onset  . Arthritis Mother   . Heart disease Mother   . Heart attack Mother   . Diabetes Sister   . Hypertension Sister   . Stroke Sister   . Heart attack Sister     SOCIAL HX: Longstanding history of nicotine use.  No regular alcohol use.  She lives with her husband who has multiple medical problems and is very dependent in care   Current Outpatient Medications:  .  ALPRAZolam (XANAX) 1 MG tablet, Take 1 tablet (1 mg total) by mouth 3 (three) times daily as needed., Disp: 90 tablet, Rfl: 2 .  apixaban (ELIQUIS) 2.5 MG TABS tablet, Take 1 tablet (2.5 mg total) by mouth 2 (two) times daily., Disp: 60 tablet,  Rfl: 5 .  atropine 1 % ophthalmic ointment, Place 1 application into the right eye 4 (four) times daily., Disp: , Rfl:  .  b complex vitamins capsule, Take 2 capsules by mouth daily. , Disp: , Rfl:  .  Biotin 5 MG CAPS, Take 1 capsule by mouth daily. , Disp: , Rfl:  .  buPROPion (WELLBUTRIN XL) 300 MG 24 hr tablet, TAKE 1 TABLET BY MOUTH EVERY DAY (Patient taking differently: Take 300 mg by mouth daily. ), Disp: 90 tablet, Rfl: 0 .  Cholecalciferol (VITAMIN D) 2000 UNITS CAPS, Take 2,000 Int'l Units by mouth., Disp: , Rfl:  .  Coenzyme Q10-Fish Oil-Vit E (CO-Q 10 OMEGA-3 FISH OIL PO), Take 100 mg by mouth daily. , Disp: , Rfl:  .  diltiazem (CARDIZEM CD) 180 MG 24 hr capsule, Take 1 capsule (180 mg total) by mouth daily., Disp: 90 capsule, Rfl: 3 .  dorzolamide-timolol (COSOPT) 22.3-6.8 MG/ML ophthalmic solution, Place 1 drop  into the right eye 4 (four) times daily., Disp: , Rfl:  .  DULoxetine (CYMBALTA) 60 MG capsule, TAKE 1 CAPSULE BY MOUTH EVERY DAY (Patient taking differently: Take 60 mg by mouth daily. ), Disp: 90 capsule, Rfl: 0 .  fluticasone (FLONASE) 50 MCG/ACT nasal spray, instill 2 sprays into each nostril once daily (Patient taking differently: Place 2 sprays into both nostrils daily as needed for allergies. ), Disp: 16 g, Rfl: 6 .  furosemide (LASIX) 20 MG tablet, TAKE 1 TABLET (20 MG TOTAL) BY MOUTH DAILY. PLEASE KEEP UPCOMING APPOINTMENT FOR FURTHER REFILLS, Disp: 90 tablet, Rfl: 1 .  gatifloxacin (ZYMAXID) 0.5 % SOLN, Place 1 drop into the right eye 4 (four) times daily., Disp: , Rfl:  .  HYDROcodone-acetaminophen (NORCO/VICODIN) 5-325 MG tablet, Take 1 tablet by mouth every 4 (four) hours as needed for moderate pain., Disp: 20 tablet, Rfl: 0 .  LIVALO 2 MG TABS, TAKE 1 TABLET BY MOUTH EVERY DAY (Patient taking differently: Take 2 mg by mouth daily. ), Disp: 90 tablet, Rfl: 3 .  loratadine (CLARITIN) 10 MG tablet, Take 10 mg by mouth daily. , Disp: , Rfl:  .  losartan (COZAAR) 25 MG  tablet, TAKE 1 TABLET EVERY DAY (Patient taking differently: Take 25 mg by mouth daily. ), Disp: 90 tablet, Rfl: 3 .  Lysine 500 MG TABS, Take 500 mg by mouth daily., Disp: , Rfl:  .  metoprolol succinate (TOPROL-XL) 25 MG 24 hr tablet, TAKE 1 TABLET BY MOUTH EVERY DAY, Disp: 90 tablet, Rfl: 3 .  nitroGLYCERIN (NITROSTAT) 0.4 MG SL tablet, place 1 tablet under the tongue every 5 minutes for UP TO 3 doses if needed, Disp: 25 tablet, Rfl: 0 .  ondansetron (ZOFRAN) 4 MG tablet, Take 1 tablet (4 mg total) by mouth every 8 (eight) hours as needed for nausea or vomiting., Disp: 5 tablet, Rfl: 0 .  prednisoLONE acetate (PRED FORTE) 1 % ophthalmic suspension, Place 1 drop into the right eye 3 (three) times daily., Disp: 10 mL, Rfl: 1 .  valACYclovir (VALTREX) 1000 MG tablet, Take 2 tablets at onset of cold sores and repeat 2 in 12 hours. (Patient taking differently: Take 2,000 mg by mouth See admin instructions. Take 2 tablets at onset of cold sores and repeat 2 in 12 hours.), Disp: 30 tablet, Rfl: 1  EXAM:  VITALS per patient if applicable:  GENERAL: alert, oriented, appears well and in no acute distress  HEENT: atraumatic, conjunttiva clear, no obvious abnormalities on inspection of external nose and ears  NECK: normal movements of the head and neck  LUNGS: on inspection no signs of respiratory distress, breathing rate appears normal, no obvious gross SOB, gasping or wheezing  CV: no obvious cyanosis  MS: moves all visible extremities without noticeable abnormality  PSYCH/NEURO: pleasant and cooperative, no obvious depression or anxiety, speech and thought processing grossly intact  ASSESSMENT AND PLAN:  Discussed the following assessment and plan:  Patient complains of bilateral lower extremity "achiness ".  She is not describing any claudication type symptoms.  Does take statin but has had no prior problems with her Livalo  -Consider warm bath or shower prior to bedtime -Consider Tylenol  over-the-counter as needed -If symptoms persist recommend further labs with TSH, basic metabolic panel, magnesium level, and creatinine kinase.     I discussed the assessment and treatment plan with the patient. The patient was provided an opportunity to ask questions and all were answered. The patient agreed with the plan  and demonstrated an understanding of the instructions.   The patient was advised to call back or seek an in-person evaluation if the symptoms worsen or if the condition fails to improve as anticipated.   Carolann Littler, MD

## 2018-06-22 NOTE — Telephone Encounter (Signed)
New message   Patient calling the office for samples of medication:   1.  What medication and dosage are you requesting samples for?apixaban (ELIQUIS) 2.5 MG TABS tablet  2.  Are you currently out of this medication? No, Patient states that she lost her pills.

## 2018-06-22 NOTE — Telephone Encounter (Signed)
Called patient and set up a WebEx appointment through her son Mancel Bale phone for 2pm today.   Please see message.

## 2018-06-22 NOTE — Telephone Encounter (Signed)
Pt reports both legs "Aching at night only." Onset 2 months ago, worsening past 2 nights.  "Entire leg, both legs from groin to foot." States once up and ambulating, resolves. States is keeping her awake last several nights. Reports  ankles had been swelling, not presently. "I haven't been taking my lasix." States better today, after restarting lasix last week. . No redness or warmth. Pt does not have computer access. States she and her son share email address if needed for virtual appt.  Pt informed she will hear from practice regarding type of appt appropriate for her. Sons email added to demographics.  CB number verified [phone]  Reason for Disposition . [1] MILD pain (e.g., does not interfere with normal activities) AND [2] present > 7 days  Answer Assessment - Initial Assessment Questions 1. ONSET: "When did the pain start?"      2 months ago 2. LOCATION: "Where is the pain located?"      Entire leg 3. PAIN: "How bad is the pain?"    (Scale 1-10; or mild, moderate, severe)   -  MILD (1-3): doesn't interfere with normal activities    -  MODERATE (4-7): interferes with normal activities (e.g., work or school) or awakens from sleep, limping    -  SEVERE (8-10): excruciating pain, unable to do any normal activities, unable to walk     Aching, not pain, "Annoying ache." 4. WORK OR EXERCISE: "Has there been any recent work or exercise that involved this part of the body?"   no,   5. CAUSE: "What do you think is causing the leg pain?"     Unsure 6. OTHER SYMPTOMS: "Do you have any other symptoms?" (e.g., chest pain, back pain, breathing difficulty, swelling, rash, fever, numbness, weakness)     no  Protocols used: LEG PAIN-A-AH

## 2018-06-22 NOTE — Telephone Encounter (Signed)
Pt is requesting refill for ELIQUIS 2.5 mg, states she lost her pills.

## 2018-06-22 NOTE — Telephone Encounter (Signed)
Pt was placed on Eliquis 2.5mg  BID on 06/16/2018 with detailed instructions per Anticoagulation Encounter. Eliquis 2.5mg  was sent in on 06/14/2018 with 5 refills. Received a message stating pt had lost her Eliquis pills.   Called the pharmacy to confirm the med was picked up and it was on 06/18/2018, asked if they would be able to refill the pt's prescription as she has lost her bottle and she stated yes since the pt has refills on this rx. Advised that the pt had just picked them up and it is too early. She ran it through insurance and stated it went through fine and this is probably due to the crisis since pts can get supplies early.   Called the pt to update her and she stated she thinks she has a ghost or something that comes in and takes her things because she has had alot of things to disappear. Updated her that I had spoken to the pharmacy and that she can go in a couple hours to pick up RX and that they ran it through insurance and stated it was no problem. Pt states she will have son go pick it up so she can take a dose tonight as she had her morning dose.

## 2018-06-27 ENCOUNTER — Other Ambulatory Visit: Payer: Self-pay

## 2018-06-27 NOTE — Patient Outreach (Signed)
[facility]) Care Management  06/27/2018  Jaimi Belle [birth year] [id number]   Medication Adherence call to Mrs. [name] patient did not answer patient is due on Livalo 2 mg under [facility].   [place] Management Direct Dial [phone]  Fax [phone] [email or web address]

## 2018-07-06 ENCOUNTER — Other Ambulatory Visit: Payer: Self-pay | Admitting: Family Medicine

## 2018-07-06 NOTE — Telephone Encounter (Signed)
Last OV 06/22/18, No future OV  Last filled 04/08/18, # 90 with 2 refills

## 2018-07-15 NOTE — Telephone Encounter (Signed)
Opened erronously

## 2018-08-17 ENCOUNTER — Encounter (INDEPENDENT_AMBULATORY_CARE_PROVIDER_SITE_OTHER): Payer: Medicare Other | Admitting: Ophthalmology

## 2018-09-16 NOTE — Progress Notes (Signed)
CHIEF COMPLAINT Patient presents for Retina Follow Up   HISTORY OF PRESENT ILLNESS: [name] is a 73 y.o. female who presents to the clinic today for:   HPI    Retina Follow Up    Patient presents with  Retinal Break/Detachment.  In right eye.  This started 3 months ago.  Severity is moderate.  I, the attending physician,  performed the HPI with the patient and updated documentation appropriately.          Comments    Patient here for 3 months retina follow up for RD OD. Patient states vision OS good. OD not so good. Depends on time of day and lighting. No eye pain.       Last edited by Bernarda Caffey, MD on 09/19/2018  2:50 PM. (History)    pt states she off all drops in both eyes, pt states she was supposed to have an appt with Dr. Kathlen Mody, but is waiting until she is released from Dr. Coralyn Pear to do so   Referring physician: Hortencia Pilar, MD [place],  [place] [id number]  HISTORICAL INFORMATION:   Selected notes from the MEDICAL RECORD NUMBER Referred by Dr. Delman Cheadle for concern of mac off RD LEE- 02.04.19  Ocular Hx-  PMH- A-Fib on coumadin, severe anxiety, CAD, depression    CURRENT MEDICATIONS: Current Outpatient Medications (Ophthalmic Drugs)  Medication Sig  . atropine 1 % ophthalmic ointment Place 1 application into the right eye 4 (four) times daily.  . dorzolamide-timolol (COSOPT) 22.3-6.8 MG/ML ophthalmic solution Place 1 drop into the right eye 4 (four) times daily.  Marland Kitchen gatifloxacin (ZYMAXID) 0.5 % SOLN Place 1 drop into the right eye 4 (four) times daily.  . prednisoLONE acetate (PRED FORTE) 1 % ophthalmic suspension Place 1 drop into the right eye 3 (three) times daily.   No current facility-administered medications for this visit.  (Ophthalmic Drugs)   Current Outpatient Medications (Other)  Medication Sig  . ALPRAZolam (XANAX) 1 MG tablet TAKE 1 TABLET (1 MG TOTAL) BY MOUTH 3 (THREE) TIMES DAILY AS NEEDED.  Marland Kitchen apixaban (ELIQUIS)  2.5 MG TABS tablet Take 1 tablet (2.5 mg total) by mouth 2 (two) times daily.  Marland Kitchen b complex vitamins capsule Take 2 capsules by mouth daily.   . Biotin 5 MG CAPS Take 1 capsule by mouth daily.   Marland Kitchen buPROPion (WELLBUTRIN XL) 300 MG 24 hr tablet TAKE 1 TABLET BY MOUTH EVERY DAY (Patient taking differently: Take 300 mg by mouth daily. )  . Cholecalciferol (VITAMIN D) 2000 UNITS CAPS Take 2,000 Int'l Units by mouth.  . Coenzyme Q10-Fish Oil-Vit E (CO-Q 10 OMEGA-3 FISH OIL PO) Take 100 mg by mouth daily.   Marland Kitchen diltiazem (CARDIZEM CD) 180 MG 24 hr capsule Take 1 capsule (180 mg total) by mouth daily.  . DULoxetine (CYMBALTA) 60 MG capsule TAKE 1 CAPSULE BY MOUTH EVERY DAY (Patient taking differently: Take 60 mg by mouth daily. )  . fluticasone (FLONASE) 50 MCG/ACT nasal spray instill 2 sprays into each nostril once daily (Patient taking differently: Place 2 sprays into both nostrils daily as needed for allergies. )  . furosemide (LASIX) 20 MG tablet TAKE 1 TABLET (20 MG TOTAL) BY MOUTH DAILY. PLEASE KEEP UPCOMING APPOINTMENT FOR FURTHER REFILLS  . HYDROcodone-acetaminophen (NORCO/VICODIN) 5-325 MG tablet Take 1 tablet by mouth every 4 (four) hours as needed for moderate pain.  Marland Kitchen LIVALO 2 MG TABS TAKE 1 TABLET BY MOUTH EVERY DAY (Patient taking differently: Take  2 mg by mouth daily. )  . loratadine (CLARITIN) 10 MG tablet Take 10 mg by mouth daily.   Marland Kitchen losartan (COZAAR) 25 MG tablet TAKE 1 TABLET EVERY DAY (Patient taking differently: Take 25 mg by mouth daily. )  . Lysine 500 MG TABS Take 500 mg by mouth daily.  . metoprolol succinate (TOPROL-XL) 25 MG 24 hr tablet TAKE 1 TABLET BY MOUTH EVERY DAY  . nitroGLYCERIN (NITROSTAT) 0.4 MG SL tablet place 1 tablet under the tongue every 5 minutes for UP TO 3 doses if needed  . ondansetron (ZOFRAN) 4 MG tablet Take 1 tablet (4 mg total) by mouth every 8 (eight) hours as needed for nausea or vomiting.  . valACYclovir (VALTREX) 1000 MG tablet Take 2 tablets at onset  of cold sores and repeat 2 in 12 hours. (Patient taking differently: Take 2,000 mg by mouth See admin instructions. Take 2 tablets at onset of cold sores and repeat 2 in 12 hours.)   No current facility-administered medications for this visit.  (Other)      REVIEW OF SYSTEMS: ROS    Positive for: Musculoskeletal, Eyes   Negative for: Constitutional, Gastrointestinal, Neurological, Skin, Genitourinary, HENT, Endocrine, Cardiovascular, Respiratory, Psychiatric, Allergic/Imm, Heme/Lymph   Last edited by Theodore Demark on 09/19/2018  2:19 PM. (History)       ALLERGIES Allergies  Allergen Reactions  . Chlorhexidine Base Itching    CHG WIPES  . Penicillins Hives and Rash    DID THE REACTION INVOLVE: Swelling of the face/tongue/throat, SOB, or low BP? No Sudden or severe rash/hives, skin peeling, or the inside of the mouth or nose? No Did it require medical treatment? No When did it last happen?90s If all above answers are "NO", may proceed with cephalosporin use.     PAST MEDICAL HISTORY Past Medical History:  Diagnosis Date  . ABDOMINAL PAIN RIGHT UPPER QUADRANT 11/08/2008  . ALLERGIC RHINITIS 08/01/2009  . Anxiety   . Complication of anesthesia 01/31/2015   ? doesn't remember anything after being taken to pre surgery  . Coronary artery calcification seen on CAT scan 05/30/2009   moderate risk coronary calcium score by chest CT  . DDD (degenerative disc disease), lumbar   . DEPRESSION 06/13/2008  . DYSLIPIDEMIA 02/27/2009  . DYSPNEA 02/27/2009  . Dysrhythmia    Afib  . Frequency of urination   . GERD (gastroesophageal reflux disease)    "years ago"  . Head injury 2016   did not loose consciousness  . History of Bell's palsy   . History of DVT of lower extremity 2006-- CHRONIC COUMADIN THERAPY  . History of hiatal hernia   . History of Lyme disease   . 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 Hypertension   . NON-HODGKIN'S LYMPHOMA, HX  OF dx  2005--  chemoradiation completed 2006--  no recurrence   onocologist- dr odogwu--   . OSTEOARTHRITIS, MODERATE 04/07/2010  . Permanent atrial fibrillation    On chronic anti-coagulation with warfarin  . Pneumonia    x 3 - last time 02/2018  . Retroperitoneal fibrosis   . Stroke [facility])    ? - [place]- "you have had 3 strokes"   Past Surgical History:  Procedure Laterality Date  . BREAST MASS EXCISION     BENIGN-- RIGHT BREAST  . CARDIOVASCULAR STRESS TEST  04-04-2009-- PER PT ASYMPTOMATIC-- MEDICAL MANAGEMENT   STUDY WAS EQUIVOCAL/ EF 49%/ GLOBAL HYPOKINESIS/ APPEARS TO BE A MILD ANTERIOR PERFUSION DEFECT COULD REPRESENT  ISCHEMIA BUT DUE TO  ACTIVITY WORSE IN STRESS THAN AT REST POSS. THE DEFECT WAS ARTIFACTUAL  . CARDIOVERSION N/A 07/11/2015   Procedure: CARDIOVERSION;  Surgeon: Larey Dresser, MD;  Location: [place];  Service: Cardiovascular;  Laterality: N/A;  . CT ANGIOGRAM  FEB 2011   CALCIUM SCORE 161/ POSS. MODERATE MID LAD STENOSIS  . CYSTOSCOPY W/ RETROGRADES  04/28/2011   Procedure: CYSTOSCOPY WITH RETROGRADE PYELOGRAM;  Surgeon: Fredricka Bonine, MD;  Location: [facility];  Service: Urology;  Laterality: Left;  . CYSTOSCOPY W/ URETERAL STENT REMOVAL  04/28/2011   Procedure: CYSTOSCOPY WITH STENT REMOVAL;  Surgeon: Fredricka Bonine, MD;  Location: [facility];  Service: Urology;;  . EXPLORATORY LAPAROTOMY  2005   LYMPHOMA  . EYE SURGERY Bilateral    cataract   . GAS INSERTION Right 03/31/2018   Procedure: RIGHT EYE INSERTION OF GAS C3F8;  Surgeon: Bernarda Caffey, MD;  Location: [place];  Service: Ophthalmology;  Laterality: Right;  . KNEE ARTHROSCOPY  2005   RIGHT  . LASER PHOTO ABLATION Right 03/31/2018   Procedure: RIGHT EYE LASER PHOTO ABLATION;  Surgeon: Bernarda Caffey, MD;  Location: [place];  Service: Ophthalmology;  Laterality: Right;  . MULTIPLE CYSTO/ LEFT URETERAL STENT EXCHANGES  LAST ONE 10-28-10  . RETROPERITONEAL BX   2007  . REVERSE SHOULDER ARTHROPLASTY Right 01/31/2015   Procedure: RIGHT REVERSE SHOULDER ARTHROPLASTY;  Surgeon: Justice Britain, MD;  Location: [place];  Service: Orthopedics;  Laterality: Right;  . TONSILLECTOMY  CHILD  . TOTAL KNEE ARTHROPLASTY  OCT 2010   RIGHT  . TOTAL KNEE ARTHROPLASTY  JAN 2010   LEFT  . TRANSTHORACIC ECHOCARDIOGRAM  [id number]   MILD SYSTOLIC DYSFUNCTION/ EF 24-09%/ MODERATE DIASTOLIC DYSFUCTION/ MILD MR/ MILD BIATRIAL ENLARGEMENT  . VITRECTOMY 25 GAUGE WITH SCLERAL BUCKLE Right 03/31/2018   Procedure: RIGHT VITRECTOMY 25 GAUGE WITH SCLERAL BUCKLE;  Surgeon: Bernarda Caffey, MD;  Location: [place];  Service: Ophthalmology;  Laterality: Right;    FAMILY HISTORY Family History  Problem Relation Age of Onset  . Arthritis Mother   . Heart disease Mother   . Heart attack Mother   . Diabetes Sister   . Hypertension Sister   . Stroke Sister   . Heart attack Sister     SOCIAL HISTORY Social History   Tobacco Use  . Smoking status: Former Smoker    Packs/day: 2.00    Years: 52.00    Pack years: 104.00    Types: Cigarettes    Quit date: 03/24/2008    Years since quitting: 10.4  . Smokeless tobacco: Never Used  Substance Use Topics  . Alcohol use: Not Currently    Comment: RARE  . Drug use: No         OPHTHALMIC EXAM:  Base Eye Exam    Visual Acuity (Snellen - Linear)      Right Left   Dist [place] 20/150 -1 20/30   Dist ph [place] 20/60 NI       Tonometry (Tonopen, 2:15 PM)      Right Left   Pressure 16 15       Pupils      Dark Light Shape React APD   Right 5 4 Irregular Minimal None   Left 4 3.5 Round Brisk None       Visual Fields (Counting fingers)      Left Right    Full    Restrictions  Partial outer inferior temporal, inferior nasal deficiencies  Extraocular Movement      Right Left    Full, Ortho Full, Ortho       Neuro/Psych    Oriented x3: Yes   Mood/Affect: Normal       Dilation    Both eyes: 1.0% Mydriacyl, 2.5%  Phenylephrine @ 2:15 PM        Slit Lamp and Fundus Exam    Slit Lamp Exam      Right Left   Lids/Lashes Dermatochalasis - upper lid Dermatochalasis - upper lid,  Mild Telangiectasia   Conjunctiva/Sclera White and quiet White and quiet   Cornea Arcus, Well healed cataract wound, 2+ Punctate epithelial erosions Arcus, 1-2+ fine Punctate epithelial erosions   Anterior Chamber Deep and quiet Deep and quiet   Iris Round and dilated Round and dilated   Lens PC IOL in good position, 1+Posterior capsular opacification PC IOL with PC folds   Vitreous post vitrectomy; superior gas bubble 5-10% Posterior vitreous detachment, Vitreous syneresis       Fundus Exam      Right Left   Disc mild Pallor, mild tilt, temporal Peripapillary atrophy Tilted disc, temporal Peripapillary atrophy   C/D Ratio 0.5 0.3   Macula Flat, reattached, Blunted foveal reflex, RPE mottling and clumping, drusen, horizontal pigmented demarcation line inferior macula Attached, flat, blunted foveal reflex, drusen; RPE mottling and clumping, No heme or edema   Vessels Vascular attenuation, Tortuous Vascular attenuation, mild tortuosity   Periphery Retina attached over buckle, good buckle height and 360 laser over buckle; superior cryo scars Attached          IMAGING AND PROCEDURES  Imaging and Procedures for   OCT, Retina - OU - Both Eyes       Right Eye Quality was good. Central Foveal Thickness: 237. Progression has been stable. Findings include normal foveal contour, no IRF, no SRF, outer retinal atrophy, epiretinal membrane, retinal drusen  (Retina re-attached, IN choroidal effusion resolved; Mild improvement in ellipsoid signal -- patchy reconstitution).   Left Eye Quality was good. Central Foveal Thickness: 282. Progression has been stable. Findings include normal foveal contour, no IRF, no SRF, retinal drusen , outer retinal atrophy, subretinal hyper-reflective material, epiretinal membrane (Thin choriod).    Notes *Images captured and stored on drive  Diagnosis / Impression:  OD: retina re-attached, mild improvement in ellipsoid signal -- patchy reconstitution OS: Non-Exudative AMD -- stable from prior  Clinical management:  See below  Abbreviations: NFP - Normal foveal profile. CME - cystoid macular edema. PED - pigment epithelial detachment. IRF - intraretinal fluid. SRF - subretinal fluid. EZ - ellipsoid zone. ERM - epiretinal membrane. ORA - outer retinal atrophy. ORT - outer retinal tubulation. SRHM - subretinal hyper-reflective material                  ASSESSMENT/PLAN:    ICD-10-CM   1. Right retinal detachment  H33.21   2. Retinal edema  H35.81 OCT, Retina - OU - Both Eyes  3. Intermediate stage nonexudative age-related macular degeneration of both eyes  H35.3132   4. Combined form of age-related cataract, both eyes  H25.813     1. Retinal detachment, OD-   - initial presentation: macula- and fovea-involving rhegmatogenous retinal detachment  - initial OCT showed IRF/cystic changes suggestive of chronic detachment  - supero-temporal detachment from 0730 to 1200 with small horseshoe tear located at 1030 and large horseshoe tear located at 1100  - s/p pneumatic cryopexy OD (02.04.19)  - was doing well post  pneumatic and then was lost to f/u from 4.23.19  - 1.3.20, pt presents urgently with nasal hemisphere field defect and found to have recurrent macula-involving, fovea-splitting, temporal retinal detachment  - no frank tear noted but SRF extends from old cryo scar (1000 - 0700, temporal quad)  - interestingly BCVA was 20/50 OD on 1.3.20 despite detachment  - now POM5 s/p SBP (41 band) + PPV/PFO/EL/FAX/14% C3F8 OD, 1.9.20  - retina reattached and in good position             - gas bubble gone  - choroidal hemorrhages/effusion remain resolved  - BCVA 20/60              - IOP okat 16             - completed PF taper              - clear to update MRx w/ Dr.  Kathlen Mody  - f/u 6 months, sooner prn  2. Retinal edema   - detached retina had cystic changes / IRF suggestive of chronic detachment both times  3. Age related macular degeneration, non-exudative, both eyes  - The incidence, anatomy, and pathology of dry AMD, risk of progression, and the AREDS and AREDS 2 study including smoking risks discussed with patient.  - Recommend amsler grid monitoring  - continue present management  4. Pseudophakia OU  - s/p CE/IOL OU  - beautiful surgeries, doing well -- performed by Dr. Read Drivers, November 2019  - monitor   Ophthalmic Meds Ordered this visit:  No orders of the defined types were placed in this encounter.      Return in 6 months (on 03/21/2019) for RD OD, DFE, OCT, MRx.  There are no Patient Instructions on file for this visit.   Explained the diagnoses, plan, and follow up with the patient and they expressed understanding.  Patient expressed understanding of the importance of proper follow up care.   This document serves as a record of services personally performed by Gardiner Sleeper, MD, PhD. It was created on their behalf by Ernest Mallick, OA, an ophthalmic assistant. The creation of this record is the provider's dictation and/or activities during the visit.    Electronically signed by: Ernest Mallick, OA  06.26.2020 12:36 AM     Gardiner Sleeper, M.D., Ph.D. Diseases & Surgery of the Retina and Vitreous [facility]  I have reviewed the above documentation for accuracy and completeness, and I agree with the above. Gardiner Sleeper, M.D., Ph.D. 09/20/18 12:36 AM    Abbreviations: M myopia (nearsighted); A astigmatism; H hyperopia (farsighted); P presbyopia; Mrx spectacle prescription;  CTL contact lenses; OD right eye; OS left eye; OU both eyes  XT exotropia; ET esotropia; PEK punctate epithelial keratitis; PEE punctate epithelial erosions; DES dry eye syndrome; MGD meibomian gland dysfunction; ATs artificial  tears; PFAT's preservative free artificial tears; [place] nuclear sclerotic cataract; PSC posterior subcapsular cataract; ERM epi-retinal membrane; PVD posterior vitreous detachment; RD retinal detachment; DM diabetes mellitus; DR diabetic retinopathy; NPDR non-proliferative diabetic retinopathy; PDR proliferative diabetic retinopathy; CSME clinically significant macular edema; DME diabetic macular edema; dbh dot blot hemorrhages; CWS cotton wool spot; POAG primary open angle glaucoma; C/D cup-to-disc ratio; HVF humphrey visual field; GVF goldmann visual field; OCT optical coherence tomography; IOP intraocular pressure; BRVO Branch retinal vein occlusion; CRVO central retinal vein occlusion; CRAO central retinal artery occlusion; BRAO branch retinal artery occlusion; RT retinal tear; SB scleral buckle; PPV  pars plana vitrectomy; VH Vitreous hemorrhage; PRP panretinal laser photocoagulation; IVK intravitreal kenalog; VMT vitreomacular traction; MH Macular hole;  NVD neovascularization of the disc; NVE neovascularization elsewhere; AREDS age related eye disease study; ARMD age related macular degeneration; POAG primary open angle glaucoma; EBMD epithelial/anterior basement membrane dystrophy; ACIOL anterior chamber intraocular lens; IOL intraocular lens; PCIOL posterior chamber intraocular lens; Phaco/IOL phacoemulsification with intraocular lens placement; [place] photorefractive keratectomy; LASIK laser assisted in situ keratomileusis; HTN hypertension; DM diabetes mellitus; COPD chronic obstructive pulmonary disease

## 2018-09-19 ENCOUNTER — Encounter (INDEPENDENT_AMBULATORY_CARE_PROVIDER_SITE_OTHER): Payer: Self-pay | Admitting: Ophthalmology

## 2018-09-19 ENCOUNTER — Ambulatory Visit (INDEPENDENT_AMBULATORY_CARE_PROVIDER_SITE_OTHER): Payer: Medicare Other | Admitting: Ophthalmology

## 2018-09-19 ENCOUNTER — Other Ambulatory Visit: Payer: Self-pay

## 2018-09-19 DIAGNOSIS — H3321 Serous retinal detachment, right eye: Secondary | ICD-10-CM | POA: Diagnosis not present

## 2018-09-19 DIAGNOSIS — H3581 Retinal edema: Secondary | ICD-10-CM | POA: Diagnosis not present

## 2018-09-19 DIAGNOSIS — H25813 Combined forms of age-related cataract, bilateral: Secondary | ICD-10-CM | POA: Diagnosis not present

## 2018-09-19 DIAGNOSIS — H353132 Nonexudative age-related macular degeneration, bilateral, intermediate dry stage: Secondary | ICD-10-CM

## 2018-09-25 ENCOUNTER — Other Ambulatory Visit: Payer: Self-pay | Admitting: Family Medicine

## 2018-09-26 NOTE — Telephone Encounter (Signed)
Not due until the 15th

## 2018-09-26 NOTE — Telephone Encounter (Signed)
Last OV 06/22/18, No future OV  Last filled 07/06/18, # 90 with 2 refills

## 2018-09-30 NOTE — Telephone Encounter (Signed)
Patient states she has no more rx refill. Patient would like to make sure refill will be ready on 7/15.   [id number] - [place], [place] [phone] (Phone) [phone] (Fax)

## 2018-10-03 NOTE — Telephone Encounter (Signed)
Please see new message

## 2018-10-07 ENCOUNTER — Telehealth: Payer: Self-pay

## 2018-10-07 NOTE — Telephone Encounter (Signed)
Please see message.  Please advise. 

## 2018-10-07 NOTE — Telephone Encounter (Signed)
Please clarify. I have no idea what she is asking from this note.

## 2018-10-07 NOTE — Telephone Encounter (Signed)
Copied from [place] [id number]. Topic: General - Inquiry >> Oct 05, 2018  2:24 PM Richardo Priest, [place] wrote: Reason for CRM: Patient is calling in asking if they can give her the podiatrist information in [place] that they suggested, call back is [phone].

## 2018-10-10 NOTE — Telephone Encounter (Signed)
Called patient and she stated that Dr. Elease Hashimoto recommended a female podiatrist in [facility] and she stated that she did not write it down at her last appointment and she stated that she does not like [place] and wanted to know who he had recommended for her.  Please advise.

## 2018-10-10 NOTE — Telephone Encounter (Signed)
The Podiatrist I had recommended is no longer there.  She could try Dr Fritzi Mandes [phone]

## 2018-10-11 NOTE — Telephone Encounter (Signed)
Called patient and gave her information from Dr. Elease Hashimoto. Patient verbalized an understanding and thanked [place].

## 2018-11-11 ENCOUNTER — Other Ambulatory Visit: Payer: Self-pay | Admitting: Family Medicine

## 2018-11-11 NOTE — Telephone Encounter (Signed)
OK to fill? Last filled in January 2019

## 2018-11-11 NOTE — Telephone Encounter (Signed)
Refill OK

## 2018-11-14 ENCOUNTER — Other Ambulatory Visit: Payer: Self-pay

## 2018-11-14 NOTE — Patient Outreach (Signed)
[facility]) Care Management  11/14/2018  [name] 1945/12/11 [id number]   Medication Adherence call to [name] patient is past due on Losartan 25 mg spoke with patient she explain she take 1 tablet daily but sometimes she does not take it the way she is supposed, patient said she has medication at this time. [name] is showing past due under [place].   [facility] Management Direct Dial [phone]  Fax [phone] [name]

## 2018-12-20 ENCOUNTER — Encounter: Payer: Self-pay | Admitting: Family Medicine

## 2018-12-20 DIAGNOSIS — Z961 Presence of intraocular lens: Secondary | ICD-10-CM | POA: Diagnosis not present

## 2018-12-20 DIAGNOSIS — H26491 Other secondary cataract, right eye: Secondary | ICD-10-CM | POA: Diagnosis not present

## 2018-12-20 DIAGNOSIS — H353131 Nonexudative age-related macular degeneration, bilateral, early dry stage: Secondary | ICD-10-CM | POA: Diagnosis not present

## 2018-12-26 ENCOUNTER — Other Ambulatory Visit: Payer: Self-pay | Admitting: Family Medicine

## 2018-12-27 NOTE — Telephone Encounter (Signed)
Last refill given on 7/13 for #90 with 2 ref

## 2019-01-02 ENCOUNTER — Ambulatory Visit: Payer: Medicare Other | Admitting: Family Medicine

## 2019-01-08 ENCOUNTER — Other Ambulatory Visit: Payer: Self-pay | Admitting: Cardiology

## 2019-01-09 MED ORDER — APIXABAN 5 MG PO TABS: 5.0000 mg | ORAL_TABLET | Freq: Two times a day (BID) | ORAL | 5 refills | Status: DC

## 2019-01-09 NOTE — Telephone Encounter (Addendum)
Prescription refill request for Eliquis received.  Last office visit:  (01-25-2018), Fransico Him Scr: 1.36 (04-06-2018) Age: 73 y.o. Weight: 102.1 kg (04-08-2018)  Reviewing pt's dosing criteria with Milas Hock D, pt qualifies for a dose increase. Will send in refill for Eliquis 5mg . Attempted to call pt and left message to inform her of her dose increase. LMOM.

## 2019-01-09 NOTE — Addendum Note (Signed)
Addended by: Johny Shock B on: 01/09/2019 09:51 AM   Modules accepted: Orders

## 2019-01-09 NOTE — Telephone Encounter (Signed)
Called this [facility] [place] [facility]. [facility] that pt's son usually picks up pt's medication.   Called and spoke to pt's son. Made him aware that pt is now to take 5mg  of Eliquis BID instead of 2.5 mg BID. He stated that his mom was asleep right now but would let her know when she wakes up.

## 2019-02-03 ENCOUNTER — Other Ambulatory Visit: Payer: Self-pay | Admitting: Cardiology

## 2019-03-07 ENCOUNTER — Other Ambulatory Visit: Payer: Self-pay | Admitting: Cardiology

## 2019-03-16 ENCOUNTER — Other Ambulatory Visit: Payer: Self-pay | Admitting: Cardiology

## 2019-03-20 ENCOUNTER — Other Ambulatory Visit: Payer: Self-pay

## 2019-03-20 ENCOUNTER — Ambulatory Visit: Payer: Self-pay | Admitting: *Deleted

## 2019-03-20 NOTE — Telephone Encounter (Signed)
Patient called stating that she has a swollen red great toe left foot.  She states she pulled a piece of darken cuticle on Thursday and since the toe has been swollen and red.  She states the nail is discolored about half was up the nail.  She rates the pain at 2 and says she is able to walk. She reportes injuring this toe last year dropping an door iron on the toe but has had no problem with it since. Care advice read to patient.  She verbalized understanding. Call was transferred to office for scheduling.  Reason for Disposition . Yellow pus seen in skin around toenail (cuticle area), or pus seen under toenail  Answer Assessment - Initial Assessment Questions 1. ONSET: "When did the pain start?"      thurday 2. LOCATION: "Where is the pain located?"   (e.g., around nail, entire toe, at foot joint)      Toe around nail left great toe 3. PAIN: "How bad is the pain?"    (Scale 1-10; or mild, moderate, severe)   -  MILD (1-3): doesn't interfere with normal activities    -  MODERATE (4-7): interferes with normal activities (e.g., work or school) or awakens from sleep, limping    -  SEVERE (8-10): excruciating pain, unable to do any normal activities, unable to walk 2 4. APPEARANCE: "What does the toe look like?" (e.g., redness, swelling, bruising, pallor)     Redness drainage     5. CAUSE: "What do you think is causing the toe pain?"     infection 6. OTHER SYMPTOMS: "Do you have any other symptoms?" (e.g., leg pain, rash, fever, numbness)     no 7. PREGNANCY: "Is there any chance you are pregnant?" "When was your last menstrual period?"     N/A  Protocols used: TOE PAIN-A-AH

## 2019-03-20 NOTE — Telephone Encounter (Signed)
Pt has appt tomorrow at 11am.

## 2019-03-21 ENCOUNTER — Encounter: Payer: Self-pay | Admitting: Family Medicine

## 2019-03-21 ENCOUNTER — Ambulatory Visit (INDEPENDENT_AMBULATORY_CARE_PROVIDER_SITE_OTHER): Payer: Medicare Other | Admitting: Family Medicine

## 2019-03-21 VITALS — BP 128/70 | HR 65 | Temp 95.3°F | Resp 16 | Ht 66.0 in | Wt 224.0 lb

## 2019-03-21 DIAGNOSIS — Z23 Encounter for immunization: Secondary | ICD-10-CM

## 2019-03-21 DIAGNOSIS — B351 Tinea unguium: Secondary | ICD-10-CM | POA: Diagnosis not present

## 2019-03-21 DIAGNOSIS — F324 Major depressive disorder, single episode, in partial remission: Secondary | ICD-10-CM

## 2019-03-21 DIAGNOSIS — L03032 Cellulitis of left toe: Secondary | ICD-10-CM

## 2019-03-21 MED ORDER — SULFAMETHOXAZOLE-TRIMETHOPRIM 800-160 MG PO TABS: 1.0000 | ORAL_TABLET | Freq: Two times a day (BID) | ORAL | 0 refills | Status: DC

## 2019-03-21 MED ORDER — BUPROPION HCL ER (XL) 300 MG PO TB24: 300.0000 mg | ORAL_TABLET | Freq: Every day | ORAL | 0 refills | Status: DC

## 2019-03-21 NOTE — Patient Instructions (Addendum)
A few things to remember from today's visit:   Paronychia, toe, left - Plan: sulfamethoxazole-trimethoprim (BACTRIM DS) 800-160 MG tablet  Onychomycosis of toenail  Major depressive disorder in partial remission, unspecified whether recurrent ([place]) - Plan: buPROPion (WELLBUTRIN XL) 300 MG 24 hr tablet  Need for influenza vaccination - Plan: Flu Vaccine QUAD High Dose(Fluad)  Soak foot in water (warm) with vinegar a few times per week. Monitor for worsening symptoms. Please call your pharmacy when you need your alprazolam, you also need to arrange an appointment with your doctor.  For toenail fungal infection I recommend applied week VapoRub every night.  Please be sure medication list is accurate. If a new problem present, please set up appointment sooner than planned today.

## 2019-03-21 NOTE — Progress Notes (Signed)
ACUTE VISIT   HPI:  Chief Complaint  Patient presents with  . Toe Pain    red & swollen, left big toe    [name] is a 73 y.o. female with hx of LE DVT on chronic anticoagulation (Eliquis),CAD,HTN, MS,and anxiety who is here today with above complaint.  4-5 days or periungual edema and erythema. She also noted some purulent drainage yesterday. Gradual onset of soreness like pain, 4-5/10. Constant pain exacerbated by palpating area.  Yesterday pain was worse and today it is better. She denies fever,chills,unusual fatigue,or changes in appetite. No Hx of recent trauma but a year ago she dropped an iron on same toe. She has not tried OTC medication, soaking foot in warm water.  Small dark subungual "spot" left great toenail and some toenails coloration changes for a while. It seems to be stable. Not causing discomfort. She has not applied OTC medications.   She is also requesting a refill on Wellbutrin, states that she will be out of alprazolam next month. History of anxiety and depression, currently she is on duloxetine 60 mg daily, alprazolam 1 mg 3 times daily as needed, and Wellbutrin XR 300 mg daily. Negative for suicidal thoughts. Problem seems to be stable. She is not reporting side effects.  Review of Systems  Constitutional: Negative for activity change and diaphoresis.  HENT: Negative for mouth sores and sore throat.   Respiratory: Negative for cough, shortness of breath and wheezing.   Gastrointestinal: Negative for nausea and vomiting.  Musculoskeletal: Positive for arthralgias and gait problem.  Skin: Negative for wound.  Psychiatric/Behavioral: Negative for confusion and hallucinations.  Rest see pertinent positives and negatives per HPI.   Current Outpatient Medications on File Prior to Visit  Medication Sig Dispense Refill  . ALPRAZolam (XANAX) 1 MG tablet TAKE 1 TABLET 3 TIMES A DAY AS NEEDED 90 tablet 2  . apixaban (ELIQUIS) 5 MG  TABS tablet Take 1 tablet (5 mg total) by mouth 2 (two) times daily. 60 tablet 5  . atropine 1 % ophthalmic ointment Place 1 application into the right eye 4 (four) times daily.    Marland Kitchen b complex vitamins capsule Take 2 capsules by mouth daily.     . Biotin 5 MG CAPS Take 1 capsule by mouth daily.     . Cholecalciferol (VITAMIN D) 2000 UNITS CAPS Take 2,000 Int'l Units by mouth.    . Coenzyme Q10-Fish Oil-Vit E (CO-Q 10 OMEGA-3 FISH OIL PO) Take 100 mg by mouth daily.     . dorzolamide-timolol (COSOPT) 22.3-6.8 MG/ML ophthalmic solution Place 1 drop into the right eye 4 (four) times daily.    . DULoxetine (CYMBALTA) 60 MG capsule TAKE 1 CAPSULE BY MOUTH EVERY DAY (Patient taking differently: Take 60 mg by mouth daily. ) 90 capsule 0  . fluticasone (FLONASE) 50 MCG/ACT nasal spray instill 2 sprays into each nostril once daily (Patient taking differently: Place 2 sprays into both nostrils daily as needed for allergies. ) 16 g 6  . furosemide (LASIX) 20 MG tablet TAKE 1 TABLET (20 MG TOTAL) BY MOUTH DAILY. PLEASE KEEP UPCOMING APPOINTMENT FOR FURTHER REFILLS 90 tablet 1  . gatifloxacin (ZYMAXID) 0.5 % SOLN Place 1 drop into the right eye 4 (four) times daily.    Marland Kitchen HYDROcodone-acetaminophen (NORCO/VICODIN) 5-325 MG tablet Take 1 tablet by mouth every 4 (four) hours as needed for moderate pain. 20 tablet 0  . loratadine (CLARITIN) 10 MG tablet Take 10 mg by mouth daily.     Marland Kitchen  losartan (COZAAR) 25 MG tablet TAKE 1 TABLET EVERY DAY (Patient taking differently: Take 25 mg by mouth daily. ) 90 tablet 3  . Lysine 500 MG TABS Take 500 mg by mouth daily.    . metoprolol succinate (TOPROL-XL) 25 MG 24 hr tablet TAKE 1 TABLET BY MOUTH EVERY DAY 90 tablet 3  . nitroGLYCERIN (NITROSTAT) 0.4 MG SL tablet place 1 tablet under the tongue every 5 minutes for UP TO 3 doses if needed 25 tablet 0  . ondansetron (ZOFRAN) 4 MG tablet Take 1 tablet (4 mg total) by mouth every 8 (eight) hours as needed for nausea or vomiting. 5  tablet 0  . Pitavastatin Calcium (LIVALO) 2 MG TABS Take 1 tablet (2 mg total) by mouth daily. 30 tablet 0  . prednisoLONE acetate (PRED FORTE) 1 % ophthalmic suspension Place 1 drop into the right eye 3 (three) times daily. 10 mL 1  . valACYclovir (VALTREX) 1000 MG tablet TAKE 2 TABLETS AT ONSET OF COLD SORES AND REPEAT 2 IN 12 HOURS. 30 tablet 1  . diltiazem (CARDIZEM CD) 180 MG 24 hr capsule Take 1 capsule (180 mg total) by mouth daily. 90 capsule 3  . [DISCONTINUED] amiodarone (PACERONE) 200 MG tablet Take 1 tablet (200 mg total) by mouth daily. 30 tablet 5   No current facility-administered medications on file prior to visit.     Past Medical History:  Diagnosis Date  . ABDOMINAL PAIN RIGHT UPPER QUADRANT 11/08/2008  . ALLERGIC RHINITIS 08/01/2009  . Anxiety   . Complication of anesthesia 01/31/2015   ? doesn't remember anything after being taken to pre surgery  . Coronary artery calcification seen on CAT scan 05/30/2009   moderate risk coronary calcium score by chest CT  . DDD (degenerative disc disease), lumbar   . DEPRESSION 06/13/2008  . DYSLIPIDEMIA 02/27/2009  . DYSPNEA 02/27/2009  . Dysrhythmia    Afib  . Frequency of urination   . GERD (gastroesophageal reflux disease)    "years ago"  . Head injury 2016   did not loose consciousness  . History of Bell's palsy   . History of DVT of lower extremity 2006-- CHRONIC COUMADIN THERAPY  . History of hiatal hernia   . History of Lyme disease   . 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 Hypertension   . NON-HODGKIN'S LYMPHOMA, HX OF dx  2005--  chemoradiation completed 2006--  no recurrence   onocologist- dr odogwu--   . OSTEOARTHRITIS, MODERATE 04/07/2010  . Permanent atrial fibrillation (HCC)    On chronic anti-coagulation with warfarin  . Pneumonia    x 3 - last time 02/2018  . Retroperitoneal fibrosis   . Stroke [facility])    ? - [place]- "you have had 3 strokes"   Allergies  Allergen  Reactions  . Chlorhexidine Base Itching    CHG WIPES  . Penicillins Hives and Rash    DID THE REACTION INVOLVE: Swelling of the face/tongue/throat, SOB, or low BP? No Sudden or severe rash/hives, skin peeling, or the inside of the mouth or nose? No Did it require medical treatment? No When did it last happen?90s If all above answers are "NO", may proceed with cephalosporin use.     Social History   Socioeconomic History  . Marital status: Married    Spouse name: Not on file  . Number of children: Not on file  . Years of education: Not on file  . Highest education level: Not on file  Occupational  History  . Not on file  Tobacco Use  . Smoking status: Former Smoker    Packs/day: 2.00    Years: 52.00    Pack years: 104.00    Types: Cigarettes    Quit date: 03/24/2008    Years since quitting: 11.0  . Smokeless tobacco: Never Used  Substance and Sexual Activity  . Alcohol use: Not Currently    Comment: RARE  . Drug use: No  . Sexual activity: Not on file  Other Topics Concern  . Not on file  Social History Narrative  . Not on file   Social Determinants of Health   Financial Resource Strain:   . Difficulty of Paying Living Expenses: Not on file  Food Insecurity:   . Worried About Charity fundraiser in the Last Year: Not on file  . Ran Out of Food in the Last Year: Not on file  Transportation Needs:   . Lack of Transportation (Medical): Not on file  . Lack of Transportation (Non-Medical): Not on file  Physical Activity:   . Days of Exercise per Week: Not on file  . Minutes of Exercise per Session: Not on file  Stress:   . Feeling of Stress : Not on file  Social Connections:   . Frequency of Communication with Friends and Family: Not on file  . Frequency of Social Gatherings with Friends and Family: Not on file  . Attends Religious Services: Not on file  . Active Member of Clubs or Organizations: Not on file  . Attends Archivist Meetings: Not on file    . Marital Status: Not on file    Vitals:   03/21/19 1055  BP: 128/70  Pulse: 65  Resp: 16  Temp: (!) 95.3 F (35.2 C)  SpO2: 97%   Body mass index is 36.15 kg/m.  Physical Exam  Nursing note and vitals reviewed. Constitutional: She is oriented to person, place, and time. She appears well-developed. No distress.  HENT:  Head: Normocephalic and atraumatic.  Eyes: Conjunctivae are normal.  Cardiovascular: Normal rate. An irregular rhythm present.  Pulses:      Dorsalis pedis pulses are 2+ on the right side and 2+ on the left side.  Respiratory: Effort normal and breath sounds normal. No respiratory distress.  Musculoskeletal:        General: No edema.  Neurological: She is alert and oriented to person, place, and time. She has normal strength.  Unstable gait assisted by a cane.  Skin: Skin is warm. No rash noted. There is erythema.  Left great toe periungual erythema and mild edema, tender with palpation. No active drainage or induration. Toenail detached from bed nail, yellowish decoloration.    Psychiatric: She has a normal mood and affect.  Well groomed, good eye contact.    ASSESSMENT AND PLAN:  Ms. Dewayna was seen today for toe pain.  Diagnoses and all orders for this visit:  Paronychia, toe, left Continue soaking foot in warm water, can add Epson salt. Side effects of abx discussed. Instructed about warning signs.  -     sulfamethoxazole-trimethoprim (BACTRIM DS) 800-160 MG tablet; Take 1 tablet by mouth 2 (two) times daily.  Onychomycosis of toenail Discussed dx,prognosis,and treatment options as well as side effects. I do not recommend oral antimycotic due to the risk of side effects and little benefit most likely. Topical Vicks Vapor rub on affected toenails every night may help as well as soaking feet in warm water with vinegar 1:1 a few  times per week.  Major depressive disorder in partial remission, unspecified whether recurrent ([place]) Problem  reported as stable. No changes in current management. She needs a f/u appt with PCP.  -     buPROPion (WELLBUTRIN XL) 300 MG 24 hr tablet; Take 1 tablet (300 mg total) by mouth daily.  Need for influenza vaccination -     Flu Vaccine QUAD High Dose(Fluad)  Return if symptoms worsen or fail to improve, for Needs f/u with PCP for anxiety..     Demetry Bendickson G. [place], MD  [facility]. [place] office.

## 2019-03-27 ENCOUNTER — Encounter (INDEPENDENT_AMBULATORY_CARE_PROVIDER_SITE_OTHER): Payer: Medicare Other | Admitting: Ophthalmology

## 2019-03-31 ENCOUNTER — Other Ambulatory Visit: Payer: Self-pay | Admitting: Family Medicine

## 2019-04-08 ENCOUNTER — Other Ambulatory Visit: Payer: Self-pay | Admitting: Cardiology

## 2019-04-23 ENCOUNTER — Other Ambulatory Visit: Payer: Self-pay | Admitting: Family Medicine

## 2019-04-26 ENCOUNTER — Other Ambulatory Visit: Payer: Self-pay | Admitting: Cardiology

## 2019-04-26 DIAGNOSIS — I4819 Other persistent atrial fibrillation: Secondary | ICD-10-CM

## 2019-04-29 ENCOUNTER — Other Ambulatory Visit: Payer: Self-pay | Admitting: Cardiology

## 2019-05-02 ENCOUNTER — Other Ambulatory Visit: Payer: Self-pay

## 2019-05-02 ENCOUNTER — Ambulatory Visit: Payer: Medicare Other | Admitting: Podiatry

## 2019-05-02 DIAGNOSIS — D689 Coagulation defect, unspecified: Secondary | ICD-10-CM

## 2019-05-02 DIAGNOSIS — M79676 Pain in unspecified toe(s): Secondary | ICD-10-CM | POA: Diagnosis not present

## 2019-05-02 DIAGNOSIS — Q828 Other specified congenital malformations of skin: Secondary | ICD-10-CM

## 2019-05-02 DIAGNOSIS — B351 Tinea unguium: Secondary | ICD-10-CM | POA: Diagnosis not present

## 2019-05-02 NOTE — Progress Notes (Signed)
She presents today chief complaint of painful hyperkeratotic lesion painful toenails.  Objective: Vital signs are stable alert and oriented x3.  Second digit of the left foot demonstrates reactive hyperkeratotic lesion preulcerative lesion distal aspect of the toe.  Nails are thick yellow dystrophic onychomycotic.  Pulses remain palpable.  Mild edema left.  Assessment: Preulcerative lesion second toe of the left foot.  Painful reactive hyperkeratotic tissue and painful toenails 1 through 5 bilateral.  Plan: Discussed etiology pathology and surgical therapies at this point went ahead and set her up for routine nail debridement.  And we placed a buttress pad beneath the digits of the left foot to help elevate and prevent an ulcerative lesion from developing.

## 2019-05-14 ENCOUNTER — Ambulatory Visit: Payer: Medicare Other | Attending: Internal Medicine

## 2019-05-14 DIAGNOSIS — Z23 Encounter for immunization: Secondary | ICD-10-CM | POA: Insufficient documentation

## 2019-05-14 NOTE — Progress Notes (Signed)
   Covid-19 Vaccination Clinic  Name:  [name]    MRN: [id number] DOB: [birth year]  05/14/2019  [name] was observed post Covid-19 immunization for 15 minutes without incidence. She was provided with Vaccine Information Sheet and instruction to access the V-Safe system.   [name] was instructed to call 911 with any severe reactions post vaccine: Marland Kitchen Difficulty breathing  . Swelling of your face and throat  . A fast heartbeat  . A bad rash all over your body  . Dizziness and weakness    Immunizations Administered    Name Date Dose VIS Date Route   Pfizer COVID-19 Vaccine 05/14/2019 10:36 AM 0.3 mL 03/03/2019 Intramuscular   Manufacturer: [place]   Lot: [id number]   [place]: [id number]

## 2019-05-15 ENCOUNTER — Other Ambulatory Visit: Payer: Self-pay | Admitting: Cardiology

## 2019-05-25 ENCOUNTER — Other Ambulatory Visit: Payer: Self-pay | Admitting: Family Medicine

## 2019-05-25 ENCOUNTER — Other Ambulatory Visit: Payer: Self-pay | Admitting: Cardiology

## 2019-05-25 DIAGNOSIS — I4819 Other persistent atrial fibrillation: Secondary | ICD-10-CM

## 2019-05-26 ENCOUNTER — Telehealth: Payer: Self-pay | Admitting: *Deleted

## 2019-05-26 NOTE — Telephone Encounter (Signed)
I spoke with pt and she states Dr. Milinda Pointer had wanted to see her for a follow up for the left 2nd toe 3 weeks after 05/02/2019, but she is scheduled in 06/2019 which is greater than 3 weeks.

## 2019-05-26 NOTE — Telephone Encounter (Signed)
Pt called states she was seen 1 week ago and has questions.

## 2019-05-29 ENCOUNTER — Other Ambulatory Visit: Payer: Self-pay | Admitting: Cardiology

## 2019-05-29 DIAGNOSIS — I4819 Other persistent atrial fibrillation: Secondary | ICD-10-CM

## 2019-05-31 ENCOUNTER — Encounter: Payer: Self-pay | Admitting: Family Medicine

## 2019-05-31 ENCOUNTER — Other Ambulatory Visit: Payer: Self-pay

## 2019-05-31 ENCOUNTER — Ambulatory Visit (INDEPENDENT_AMBULATORY_CARE_PROVIDER_SITE_OTHER): Payer: Medicare Other | Admitting: Family Medicine

## 2019-05-31 ENCOUNTER — Telehealth: Payer: Self-pay | Admitting: Family Medicine

## 2019-05-31 VITALS — BP 124/74 | HR 78 | Temp 97.0°F | Ht 66.0 in | Wt 225.4 lb

## 2019-05-31 DIAGNOSIS — L03116 Cellulitis of left lower limb: Secondary | ICD-10-CM

## 2019-05-31 MED ORDER — CEPHALEXIN 500 MG PO CAPS: 500.0000 mg | ORAL_CAPSULE | Freq: Four times a day (QID) | ORAL | 0 refills | Status: DC

## 2019-05-31 NOTE — Telephone Encounter (Signed)
NO

## 2019-05-31 NOTE — Telephone Encounter (Signed)
Please see message. °

## 2019-05-31 NOTE — Patient Instructions (Signed)

## 2019-05-31 NOTE — Telephone Encounter (Signed)
Pt is wondering if the antibiotic will interfere with her getting her second covid vaccine inj?   She is scheduled to get her second inj March 17  Pt can be reached at [phone] ok to leave detailed message per pt

## 2019-05-31 NOTE — Telephone Encounter (Signed)
Called patient and gave her the message from Dr. Burchette. Patient verbalized an understanding. 

## 2019-05-31 NOTE — Progress Notes (Signed)
Subjective:     Patient ID: [name], female   DOB: 09-02-1945, 74 y.o.   MRN: [id number]  HPI Brice is seen with some redness left foot and left second toe.  She actually saw a podiatrist back in February on February 9 and had some nail debridement.  She tried to call back there to get follow-up but had some difficulties getting in.  She denies any fevers or chills.  She has not seen any drainage.  She has reported allergy to penicillin and sulfa.  She states she has taken Keflex in the past.  Past Medical History:  Diagnosis Date  . ABDOMINAL PAIN RIGHT UPPER QUADRANT 11/08/2008  . ALLERGIC RHINITIS 08/01/2009  . Anxiety   . Complication of anesthesia 01/31/2015   ? doesn't remember anything after being taken to pre surgery  . Coronary artery calcification seen on CAT scan 05/30/2009   moderate risk coronary calcium score by chest CT  . DDD (degenerative disc disease), lumbar   . DEPRESSION 06/13/2008  . DYSLIPIDEMIA 02/27/2009  . DYSPNEA 02/27/2009  . Dysrhythmia    Afib  . Frequency of urination   . GERD (gastroesophageal reflux disease)    "years ago"  . Head injury 2016   did not loose consciousness  . History of Bell's palsy   . History of DVT of lower extremity 2006-- CHRONIC COUMADIN THERAPY  . History of hiatal hernia   . History of Lyme disease   . 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 Hypertension   . NON-HODGKIN'S LYMPHOMA, HX OF dx  2005--  chemoradiation completed 2006--  no recurrence   onocologist- dr odogwu--   . OSTEOARTHRITIS, MODERATE 04/07/2010  . Permanent atrial fibrillation (HCC)    On chronic anti-coagulation with warfarin  . Pneumonia    x 3 - last time 02/2018  . Retroperitoneal fibrosis   . Stroke [facility])    ? - [place]- "you have had 3 strokes"   Past Surgical History:  Procedure Laterality Date  . BREAST MASS EXCISION     BENIGN-- RIGHT BREAST  . CARDIOVASCULAR STRESS TEST  04-04-2009-- PER PT  ASYMPTOMATIC-- MEDICAL MANAGEMENT   STUDY WAS EQUIVOCAL/ EF 49%/ GLOBAL HYPOKINESIS/ APPEARS TO BE A MILD ANTERIOR PERFUSION DEFECT COULD REPRESENT ISCHEMIA BUT DUE TO  ACTIVITY WORSE IN STRESS THAN AT REST POSS. THE DEFECT WAS ARTIFACTUAL  . CARDIOVERSION N/A 07/11/2015   Procedure: CARDIOVERSION;  Surgeon: Larey Dresser, MD;  Location: [place];  Service: Cardiovascular;  Laterality: N/A;  . CT ANGIOGRAM  FEB 2011   CALCIUM SCORE 161/ POSS. MODERATE MID LAD STENOSIS  . CYSTOSCOPY W/ RETROGRADES  04/28/2011   Procedure: CYSTOSCOPY WITH RETROGRADE PYELOGRAM;  Surgeon: Fredricka Bonine, MD;  Location: [facility];  Service: Urology;  Laterality: Left;  . CYSTOSCOPY W/ URETERAL STENT REMOVAL  04/28/2011   Procedure: CYSTOSCOPY WITH STENT REMOVAL;  Surgeon: Fredricka Bonine, MD;  Location: [facility];  Service: Urology;;  . EXPLORATORY LAPAROTOMY  2005   LYMPHOMA  . EYE SURGERY Bilateral    cataract   . GAS INSERTION Right 03/31/2018   Procedure: RIGHT EYE INSERTION OF GAS C3F8;  Surgeon: Bernarda Caffey, MD;  Location: [place];  Service: Ophthalmology;  Laterality: Right;  . KNEE ARTHROSCOPY  2005   RIGHT  . LASER PHOTO ABLATION Right 03/31/2018   Procedure: RIGHT EYE LASER PHOTO ABLATION;  Surgeon: Bernarda Caffey, MD;  Location: [place];  Service: Ophthalmology;  Laterality:  Right;  Marland Kitchen MULTIPLE CYSTO/ LEFT URETERAL STENT EXCHANGES  LAST ONE 10-28-10  . RETROPERITONEAL BX  2007  . REVERSE SHOULDER ARTHROPLASTY Right 01/31/2015   Procedure: RIGHT REVERSE SHOULDER ARTHROPLASTY;  Surgeon: Justice Britain, MD;  Location: [place];  Service: Orthopedics;  Laterality: Right;  . TONSILLECTOMY  CHILD  . TOTAL KNEE ARTHROPLASTY  OCT 2010   RIGHT  . TOTAL KNEE ARTHROPLASTY  JAN 2010   LEFT  . TRANSTHORACIC ECHOCARDIOGRAM  [id number]   MILD SYSTOLIC DYSFUNCTION/ EF [id number] MODERATE DIASTOLIC DYSFUCTION/ MILD MR/ MILD BIATRIAL ENLARGEMENT  . VITRECTOMY 25 GAUGE WITH  SCLERAL BUCKLE Right 03/31/2018   Procedure: RIGHT VITRECTOMY 25 GAUGE WITH SCLERAL BUCKLE;  Surgeon: Bernarda Caffey, MD;  Location: [place];  Service: Ophthalmology;  Laterality: Right;    reports that she quit smoking about 11 years ago. Her smoking use included cigarettes. She has a 104.00 pack-year smoking history. She has never used smokeless tobacco. She reports previous alcohol use. She reports that she does not use drugs. family history includes Arthritis in her mother; Diabetes in her sister; Heart attack in her mother and sister; Heart disease in her mother; Hypertension in her sister; Stroke in her sister. Allergies  Allergen Reactions  . Chlorhexidine Base Itching    CHG WIPES  . Sulfa Antibiotics Other (See Comments)    Severe GI upset  . Penicillins Hives and Rash    DID THE REACTION INVOLVE: Swelling of the face/tongue/throat, SOB, or low BP? No Sudden or severe rash/hives, skin peeling, or the inside of the mouth or nose? No Did it require medical treatment? No When did it last happen?90s If all above answers are "NO", may proceed with cephalosporin use.      Review of Systems  Constitutional: Negative for chills and fever.  Gastrointestinal: Negative for nausea and vomiting.       Objective:   Physical Exam Vitals reviewed.  Constitutional:      Appearance: Normal appearance.  Cardiovascular:     Rate and Rhythm: Normal rate and regular rhythm.  Skin:    Comments: She has some mild erythema involving the left second toe with some extension distal third of the foot dorsally.  Mild warmth to touch and also slightly tender.  Mild edema.  No open wounds.  She has very thickened callused area left second toe distally  Neurological:     Mental Status: She is alert.        Assessment:     Cellulitis left second toe and left foot    Plan:     -Elevate foot and leg frequently -Warm compresses several times daily -Keflex 500 mg 4 times daily for 10 days.   Follow-up promptly for any progressive redness, fever, or other concerns or if this is not resolving over the next week  [name] MD [facility]

## 2019-06-04 ENCOUNTER — Other Ambulatory Visit: Payer: Self-pay | Admitting: Cardiology

## 2019-06-05 ENCOUNTER — Other Ambulatory Visit: Payer: Self-pay | Admitting: Cardiology

## 2019-06-07 ENCOUNTER — Ambulatory Visit: Payer: Medicare Other | Attending: Internal Medicine

## 2019-06-07 DIAGNOSIS — Z23 Encounter for immunization: Secondary | ICD-10-CM

## 2019-06-07 NOTE — Progress Notes (Signed)
   Covid-19 Vaccination Clinic  Name:  Enid Belin    MRN: [id number] DOB: 09/02/1945  06/07/2019  Ms. Bascom was observed post Covid-19 immunization for 15 minutes without incident. She was provided with Vaccine Information Sheet and instruction to access the V-Safe system.   Ms. Michelson was instructed to call 911 with any severe reactions post vaccine: Marland Kitchen Difficulty breathing  . Swelling of face and throat  . A fast heartbeat  . A bad rash all over body  . Dizziness and weakness   Immunizations Administered    Name Date Dose VIS Date Route   Pfizer COVID-19 Vaccine 06/07/2019 11:59 AM 0.3 mL 03/03/2019 Intramuscular   Manufacturer: [place]   Lot: [id number]   [place]: [id number]

## 2019-06-12 ENCOUNTER — Other Ambulatory Visit: Payer: Self-pay | Admitting: Family Medicine

## 2019-06-12 DIAGNOSIS — F324 Major depressive disorder, single episode, in partial remission: Secondary | ICD-10-CM

## 2019-06-14 ENCOUNTER — Other Ambulatory Visit: Payer: Self-pay | Admitting: Cardiology

## 2019-06-14 MED ORDER — LOSARTAN POTASSIUM 25 MG PO TABS: ORAL_TABLET | ORAL | 0 refills | Status: DC

## 2019-06-14 NOTE — Telephone Encounter (Signed)
*  STAT* If patient is at the pharmacy, call can be transferred to refill team.   1. Which medications need to be refilled? (please list name of each medication and dose if known)  losartan (COZAAR) 25 MG tablet  2. Which pharmacy/location (including street and city if local pharmacy) is medication to be sent to? [id number] - [place], [place] - [place]  3. Do they need a 30 day or 90 day supply? 30 with refills  Patient has an appointment on 07-14-19 to see Dr. Radford Pax. She will not have enough medication to last her until her next appointment

## 2019-06-14 NOTE — Telephone Encounter (Signed)
Pt's medication was sent to pt's pharmacy as requested. Confirmation received.  °

## 2019-06-24 ENCOUNTER — Other Ambulatory Visit: Payer: Self-pay | Admitting: Cardiology

## 2019-06-29 ENCOUNTER — Other Ambulatory Visit: Payer: Self-pay | Admitting: Family Medicine

## 2019-06-30 NOTE — Telephone Encounter (Signed)
Last ov:05/31/19 Last filled:04/02/19

## 2019-07-03 DIAGNOSIS — M79672 Pain in left foot: Secondary | ICD-10-CM | POA: Diagnosis not present

## 2019-07-04 ENCOUNTER — Ambulatory Visit: Payer: Medicare Other | Admitting: Podiatry

## 2019-07-09 ENCOUNTER — Other Ambulatory Visit: Payer: Self-pay | Admitting: Cardiology

## 2019-07-11 ENCOUNTER — Other Ambulatory Visit: Payer: Self-pay | Admitting: Cardiology

## 2019-07-14 ENCOUNTER — Ambulatory Visit: Payer: Medicare Other | Admitting: Cardiology

## 2019-07-14 NOTE — Progress Notes (Deleted)
erroroneous encounter

## 2019-07-19 DIAGNOSIS — L602 Onychogryphosis: Secondary | ICD-10-CM | POA: Diagnosis not present

## 2019-07-19 DIAGNOSIS — B351 Tinea unguium: Secondary | ICD-10-CM | POA: Diagnosis not present

## 2019-07-19 DIAGNOSIS — Z79899 Other long term (current) drug therapy: Secondary | ICD-10-CM | POA: Diagnosis not present

## 2019-07-19 DIAGNOSIS — L603 Nail dystrophy: Secondary | ICD-10-CM | POA: Diagnosis not present

## 2019-07-21 ENCOUNTER — Telehealth: Payer: Self-pay | Admitting: Cardiology

## 2019-07-21 DIAGNOSIS — I4821 Permanent atrial fibrillation: Secondary | ICD-10-CM

## 2019-07-21 NOTE — Telephone Encounter (Signed)
Eliquis 5mg  refill request received. Patient is 74 years old, weight-102.2kg, Crea-1.36 on 04/06/2018-NEEDS UPDATED LABS, Diagnosis-Afib, and last seen by Dr. Radford Pax on 01/25/2018-NEEDS AN APPT (she was scheduled for 07/14/2019 and appt is marked as a No Show)   @906am  called pt and left a message for pt to call back at [phone]; she needs to schedule an appt with MD and needs labs.

## 2019-07-26 ENCOUNTER — Other Ambulatory Visit: Payer: Self-pay | Admitting: Cardiology

## 2019-07-27 NOTE — Telephone Encounter (Signed)
Called pt as she is overdue for a Cardiology appt and needs updated labs, but had to leave a message for the pt to call back to schedule an appt.

## 2019-07-28 ENCOUNTER — Other Ambulatory Visit: Payer: Self-pay | Admitting: Cardiology

## 2019-07-28 NOTE — Telephone Encounter (Signed)
Called patient. She has appointment with Dr. Radford Pax on 5/20. I have ordered labs for that day too. She has enough Eliquis to get her to appointment

## 2019-08-01 ENCOUNTER — Ambulatory Visit: Payer: Medicare Other | Admitting: Podiatry

## 2019-08-01 ENCOUNTER — Encounter: Payer: Self-pay | Admitting: Podiatry

## 2019-08-01 ENCOUNTER — Other Ambulatory Visit: Payer: Self-pay

## 2019-08-01 ENCOUNTER — Telehealth: Payer: Self-pay | Admitting: Family Medicine

## 2019-08-01 DIAGNOSIS — D689 Coagulation defect, unspecified: Secondary | ICD-10-CM

## 2019-08-01 DIAGNOSIS — M79676 Pain in unspecified toe(s): Secondary | ICD-10-CM | POA: Diagnosis not present

## 2019-08-01 DIAGNOSIS — L97511 Non-pressure chronic ulcer of other part of right foot limited to breakdown of skin: Secondary | ICD-10-CM | POA: Diagnosis not present

## 2019-08-01 DIAGNOSIS — B351 Tinea unguium: Secondary | ICD-10-CM | POA: Diagnosis not present

## 2019-08-01 NOTE — Chronic Care Management (AMB) (Signed)
  Chronic Care Management   Outreach Note  08/01/2019 Name: [name] MRN: [id number] DOB: 11/16/1945  Referred by: [name], MD Reason for referral : Chronic Care Management   An unsuccessful telephone outreach was attempted today. The patient was referred to the pharmacist for assistance with care management and care coordination.   Follow Up Plan:   [name]

## 2019-08-01 NOTE — Patient Instructions (Signed)
Corns and Calluses Corns are small areas of thickened skin that occur on the top, sides, or tip of a toe. They contain a cone-shaped core with a point that can press on a nerve below. This causes pain.  Calluses are areas of thickened skin that can occur anywhere on the body, including the hands, fingers, palms, soles of the feet, and heels. Calluses are usually larger than corns. What are the causes? Corns and calluses are caused by rubbing (friction) or pressure, such as from shoes that are too tight or do not fit properly. What increases the risk? Corns are more likely to develop in people who have misshapen toes (toe deformities), such as hammer toes. Calluses can occur with friction to any area of the skin. They are more likely to develop in people who:  Work with their hands.  Wear shoes that fit poorly, are too tight, or are high-heeled.  Have toe deformities. What are the signs or symptoms? Symptoms of a corn or callus include:  A hard growth on the skin.  Pain or tenderness under the skin.  Redness and swelling.  Increased discomfort while wearing tight-fitting shoes, if your feet are affected. If a corn or callus becomes infected, symptoms may include:  Redness and swelling that gets worse.  Pain.  Fluid, blood, or pus draining from the corn or callus. How is this diagnosed? Corns and calluses may be diagnosed based on your symptoms, your medical history, and a physical exam. How is this treated? Treatment for corns and calluses may include:  Removing the cause of the friction or pressure. This may involve: ? Changing your shoes. ? Wearing shoe inserts (orthotics) or other protective layers in your shoes, such as a corn pad. ? Wearing gloves.  Applying medicine to the skin (topical medicine) to help soften skin in the hardened, thickened areas.  Removing layers of dead skin with a file to reduce the size of the corn or callus.  Removing the corn or callus with a  scalpel or laser.  Taking antibiotic medicines, if your corn or callus is infected.  Having surgery, if a toe deformity is the cause. Follow these instructions at home:   Take over-the-counter and prescription medicines only as told by your health care provider.  If you were prescribed an antibiotic, take it as told by your health care provider. Do not stop taking it even if your condition starts to improve.  Wear shoes that fit well. Avoid wearing high-heeled shoes and shoes that are too tight or too loose.  Wear any padding, protective layers, gloves, or orthotics as told by your health care provider.  Soak your hands or feet and then use a file or pumice stone to soften your corn or callus. Do this as told by your health care provider.  Check your corn or callus every day for symptoms of infection. Contact a health care provider if you:  Notice that your symptoms do not improve with treatment.  Have redness or swelling that gets worse.  Notice that your corn or callus becomes painful.  Have fluid, blood, or pus coming from your corn or callus.  Have new symptoms. Summary  Corns are small areas of thickened skin that occur on the top, sides, or tip of a toe.  Calluses are areas of thickened skin that can occur anywhere on the body, including the hands, fingers, palms, and soles of the feet. Calluses are usually larger than corns.  Corns and calluses are caused by   rubbing (friction) or pressure, such as from shoes that are too tight or do not fit properly.  Treatment may include wearing any padding, protective layers, gloves, or orthotics as told by your health care provider. This information is not intended to replace advice given to you by your health care provider. Make sure you discuss any questions you have with your health care provider. Document Revised: 06/29/2018 Document Reviewed: 01/20/2017 Elsevier Patient Education  2020 [employer]  Onychomycosis/Fungal  Toenails  WHAT IS IT? An infection that lies within the keratin of your nail plate that is caused by a fungus.  WHY ME? Fungal infections affect all ages, sexes, races, and creeds.  There may be many factors that predispose you to a fungal infection such as age, coexisting medical conditions such as diabetes, or an autoimmune disease; stress, medications, fatigue, genetics, etc.  Bottom line: fungus thrives in a warm, moist environment and your shoes offer such a location.  IS IT CONTAGIOUS? Theoretically, yes.  You do not want to share shoes, nail clippers or files with someone who has fungal toenails.  Walking around barefoot in the same room or sleeping in the same bed is unlikely to transfer the organism.  It is important to realize, however, that fungus can spread easily from one nail to the next on the same foot.  HOW DO WE TREAT THIS?  There are several ways to treat this condition.  Treatment may depend on many factors such as age, medications, pregnancy, liver and kidney conditions, etc.  It is best to ask your doctor which options are available to you.  4. No treatment.   Unlike many other medical concerns, you can live with this condition.  However for many people this can be a painful condition and may lead to ingrown toenails or a bacterial infection.  It is recommended that you keep the nails cut short to help reduce the amount of fungal nail. 5. Topical treatment.  These range from herbal remedies to prescription strength nail lacquers.  About 40-50% effective, topicals require twice daily application for approximately 9 to 12 months or until an entirely new nail has grown out.  The most effective topicals are medical grade medications available through physicians offices. 6. Oral antifungal medications.  With an 80-90% cure rate, the most common oral medication requires 3 to 4 months of therapy and stays in your system for a year as the new nail grows out.  Oral antifungal medications do  require blood work to make sure it is a safe drug for you.  A liver function panel will be performed prior to starting the medication and after the first month of treatment.  It is important to have the blood work performed to avoid any harmful side effects.  In general, this medication safe but blood work is required. 7. Laser Therapy.  This treatment is performed by applying a specialized laser to the affected nail plate.  This therapy is noninvasive, fast, and non-painful.  It is not covered by insurance and is therefore, out of pocket.  The results have been very good with a 80-95% cure rate.  [facility] is the only practice in the area to offer this therapy. 8. Permanent Nail Avulsion.  Removing the entire nail so that a new nail will not grow back. 

## 2019-08-03 ENCOUNTER — Telehealth: Payer: Self-pay | Admitting: *Deleted

## 2019-08-03 NOTE — Telephone Encounter (Deleted)
Has been resolved per patient

## 2019-08-03 NOTE — Telephone Encounter (Signed)
Pt called back stating that she is not on any antibiotics please advise

## 2019-08-03 NOTE — Telephone Encounter (Addendum)
Called patient several times review antibiotic meds list before new antibiotic can be prescribed per Dr. Elisha Ponder.I  left VM for return call back to our office.  Spoke with patient and she stated that she is currently not on any other antibiotics at this time.  Called pharmacy and gave verbal prescription order(Doxycycline-100mg  bid for 7 days) per Dr. Elisha Ponder

## 2019-08-03 NOTE — Telephone Encounter (Signed)
Patient called and said that the antibiotic that was ordered by doctor was never received at pharmacy([facility]), also confirmed by pharmacy that it never came in.  Please call w/ verbal or resend.

## 2019-08-05 NOTE — Progress Notes (Signed)
Subjective: Patient presents today with diabetes and cc of painful, discolored, thick toenails which interfere with daily activities. Pain is aggravated when wearing enclosed shoe gear. Pain is getting progressively worse and relieved with periodic professional debridement.  New concern today: painful left 3rd digit where she has distal corn. Tender when ambulating/wearing enclosed shoe gear. She denies any drainage or swelling, but states digit is tender. Denies any fever, chills, night sweats, nausea or vomiting.  Angela Post, MD is her PCP.   Current Outpatient Medications on File Prior to Visit  Medication Sig Dispense Refill  . ALPRAZolam (XANAX) 1 MG tablet TAKE 1 TABLET BY MOUTH THREE TIMES A DAY AS NEEDED 90 tablet 2  . apixaban (ELIQUIS) 5 MG TABS tablet Take 1 tablet (5 mg total) by mouth 2 (two) times daily. 60 tablet 5  . atropine 1 % ophthalmic ointment Place 1 application into the right eye 4 (four) times daily.    Marland Kitchen b complex vitamins capsule Take 2 capsules by mouth daily.     . Biotin 5 MG CAPS Take 1 capsule by mouth daily.     Marland Kitchen buPROPion (WELLBUTRIN XL) 300 MG 24 hr tablet TAKE 1 TABLET BY MOUTH EVERY DAY 90 tablet 0  . cephALEXin (KEFLEX) 500 MG capsule Take 1 capsule (500 mg total) by mouth 4 (four) times daily. 40 capsule 0  . Cholecalciferol (VITAMIN D) 2000 UNITS CAPS Take 2,000 Int'l Units by mouth.    . Coenzyme Q10-Fish Oil-Vit E (CO-Q 10 OMEGA-3 FISH OIL PO) Take 100 mg by mouth daily.     Marland Kitchen diltiazem (CARDIZEM CD) 180 MG 24 hr capsule Take 1 capsule (180 mg total) by mouth daily. Please schedule overdue follow up appt for further refills [phone]. 3rd and final attempt 15 capsule 0  . dorzolamide-timolol (COSOPT) 22.3-6.8 MG/ML ophthalmic solution Place 1 drop into the right eye 4 (four) times daily.    Marland Kitchen doxycycline (VIBRAMYCIN) 100 MG capsule Take 100 mg by mouth 2 (two) times daily.    . DULoxetine (CYMBALTA) 60 MG capsule TAKE 1 CAPSULE BY MOUTH EVERY  DAY (Patient taking differently: Take 60 mg by mouth daily. ) 90 capsule 0  . fluticasone (FLONASE) 50 MCG/ACT nasal spray instill 2 sprays into each nostril once daily (Patient taking differently: Place 2 sprays into both nostrils daily as needed for allergies. ) 16 g 6  . furosemide (LASIX) 20 MG tablet Take 1 tablet (20 mg total) by mouth daily. Needs appointment for refills. [phone] 30 tablet 0  . gatifloxacin (ZYMAXID) 0.5 % SOLN Place 1 drop into the right eye 4 (four) times daily.    Marland Kitchen ketoconazole (NIZORAL) 2 % cream SMARTSIG:1 Application Topical 1 to 2 Times Daily    . loratadine (CLARITIN) 10 MG tablet Take 10 mg by mouth daily.     Marland Kitchen losartan (COZAAR) 25 MG tablet TAKE 1 TABLET BY MOUTH DAILY. PLEASE KEEP APPT WITH DR. Radford Pax FOR FUTURE REFILLS. 90 tablet 0  . Lysine 500 MG TABS Take 500 mg by mouth daily.    . metoprolol succinate (TOPROL-XL) 25 MG 24 hr tablet Take 1 tablet (25 mg total) by mouth daily. Please schedule annual appt with Dr. Radford Pax for refills. [phone]. 2nd attempt. 15 tablet 0  . nitroGLYCERIN (NITROSTAT) 0.4 MG SL tablet place 1 tablet under the tongue every 5 minutes for UP TO 3 doses if needed 25 tablet 0  . ondansetron (ZOFRAN) 4 MG tablet Take 1 tablet (4 mg total) by  mouth every 8 (eight) hours as needed for nausea or vomiting. 5 tablet 0  . Pitavastatin Calcium (LIVALO) 2 MG TABS Take 1 tablet (2 mg total) by mouth daily. Please keep upcoming appt in April with Dr. Radford Pax before anymore refills. Thank you 30 tablet 0  . prednisoLONE acetate (PRED FORTE) 1 % ophthalmic suspension Place 1 drop into the right eye 3 (three) times daily. 10 mL 1  . sulfamethoxazole-trimethoprim (BACTRIM DS) 800-160 MG tablet Take 1 tablet by mouth 2 (two) times daily. 14 tablet 0  . valACYclovir (VALTREX) 1000 MG tablet TAKE 2 TABLETS AT ONSET OF COLD SORES AND REPEAT 2 IN 12 HOURS. 30 tablet 1  . [DISCONTINUED] amiodarone (PACERONE) 200 MG tablet Take 1 tablet (200 mg total)  by mouth daily. 30 tablet 5   No current facility-administered medications on file prior to visit.     Allergies  Allergen Reactions  . Chlorhexidine Base Itching    CHG WIPES  . Sulfa Antibiotics Other (See Comments)    Severe GI upset  . Penicillins Hives and Rash    DID THE REACTION INVOLVE: Swelling of the face/tongue/throat, SOB, or low BP? No Sudden or severe rash/hives, skin peeling, or the inside of the mouth or nose? No Did it require medical treatment? No When did it last happen?90s If all above answers are "NO", may proceed with cephalosporin use.      Objective: There were no vitals filed for this visit.  74 y.o. female IN NAD. AAO X 3.  Capillary refill time to digits immediate b/l. Palpable DP pulses b/l. Palpable PT pulses b/l. Pedal hair present b/l. Skin temperature gradient within normal limits b/l. Unilateral edema left LE.  Pedal skin with normal turgor, texture and tone bilaterally. No interdigital macerations bilaterally. Toenails 1-5 b/l elongated, dystrophic, thickened, crumbly with subungual debris and tenderness to dorsal palpation.   Wound Location: L 3rd toe There is minimal amount of devitalized in the wound. Predebridement Wound Measurement: 1.0 x 1.0 cm with hyperkeratotic roof and visible subdermal hemorrhage. Postdebridement Wound Measurement: 1.0 x 1.5 cm  Wound Base: Granular/Healthy Peri-wound: Normal Exudate: None: wound tissue dry Blood Loss during debridement: 0 cc's. Description of tissue removed from ulceration today: Hyperkeratosis Signs of clinical bacterial infection are absent. Material in wound which inhibits healing/promotes adjacent tissue breakdown: hyperkeratosis. Wound is without warmth, erythema, signs of acute infection and no undermining   Normal muscle strength 5/5 to all lower extremity muscle groups bilaterally. No pain crepitus or joint limitation noted with ROM b/l. Hammertoes noted to the L 3rd toe and R 4th  toe.  Protective sensation intact 5/5 intact bilaterally with 10g monofilament b/l. Vibratory sensation intact b/l.  Assessment: Skin ulcer of toe of right foot, limited to breakdown of skin (HCC)  Plan: -Examined patient. -Toenails 1-5 b/l were debrided in length and girth with sterile nail nippers and dremel without iatrogenic bleeding.  -Patient to continue soft, supportive shoe gear daily. -Patient to report any pedal injuries to medical professional immediately. -Ulcer was debrided of nonviable necrotic tissue and was resected to the level of dermis utilizing sterile tissue nipper and sterile scalpel blade. Ulcer was cleansed with wound cleanser and triple antibiotic ointment was applied to base of wound with light dressing. -Patient/POA to call should there be question/concern in the interim.  Patient/POA/Family member was evaluated and treated and all questions answered. Patient/POA/Family member educated on diagnosis and treatment plan of routine ulcer debridement/wound care.  -Ulcer debrided utilizing: sterile tissue  nipper and sterile scalpel blade. -Amount of devitalized tissue removed: moderate amount of hyperkeratosis -Today's ulcer size Bray-debridement: 1.0 x 1.5 cm and is partial thickness. -Dispensed toe crest for left 3rd digit and right 4th digit to prevent pressure on distal aspect of digit. She is to wear daily. -Wound responded well to today's debridement. Patient risk factors affecting healing of ulcer: toe deformity Frequency of debridement needed to achieve healing:   Return in about 4 weeks (around 08/29/2019) follow up of left 3rd partial thickness ulcer.  Marzetta Board, DPM

## 2019-08-10 ENCOUNTER — Encounter: Payer: Self-pay | Admitting: Cardiology

## 2019-08-10 ENCOUNTER — Other Ambulatory Visit: Payer: Self-pay

## 2019-08-10 ENCOUNTER — Other Ambulatory Visit: Payer: Medicare Other

## 2019-08-10 ENCOUNTER — Ambulatory Visit: Payer: Medicare Other | Admitting: Cardiology

## 2019-08-10 VITALS — BP 130/72 | HR 66 | Ht 66.0 in | Wt 227.8 lb

## 2019-08-10 DIAGNOSIS — E7800 Pure hypercholesterolemia, unspecified: Secondary | ICD-10-CM

## 2019-08-10 DIAGNOSIS — I10 Essential (primary) hypertension: Secondary | ICD-10-CM | POA: Diagnosis not present

## 2019-08-10 DIAGNOSIS — I350 Nonrheumatic aortic (valve) stenosis: Secondary | ICD-10-CM | POA: Diagnosis not present

## 2019-08-10 DIAGNOSIS — I420 Dilated cardiomyopathy: Secondary | ICD-10-CM | POA: Diagnosis not present

## 2019-08-10 DIAGNOSIS — I2510 Atherosclerotic heart disease of native coronary artery without angina pectoris: Secondary | ICD-10-CM

## 2019-08-10 DIAGNOSIS — I4821 Permanent atrial fibrillation: Secondary | ICD-10-CM

## 2019-08-10 DIAGNOSIS — I5032 Chronic diastolic (congestive) heart failure: Secondary | ICD-10-CM

## 2019-08-10 MED ORDER — DILTIAZEM HCL ER COATED BEADS 120 MG PO CP24
120.0000 mg | ORAL_CAPSULE | Freq: Every day | ORAL | 3 refills | Status: DC
Start: 2019-08-10 — End: 2020-07-16

## 2019-08-10 NOTE — Progress Notes (Signed)
Cardiology Office Note:    Date:  08/10/2019   ID:  [name], DOB Sep 10, 1945, MRN [id number]  PCP:  [name], MD  Cardiologist:  Fransico Him, MD    Referring MD: [name], MD   Chief Complaint  Patient presents with  . Coronary Artery Disease  . Hypertension  . Hyperlipidemia  . Atrial Fibrillation  . Congestive Heart Failure  . Aortic Stenosis    History of Present Illness:    [name] is a 74 y.o. female with a hx of retroperitoneal fibrosis and non-Hodgkin's lymphoma as well as prior DVT. She has a history of coronary artery calcifications with a moderate calcium score of 161 (putting her at a high risk percentile for her age) and a possible moderate mid LAD stenosis (though some degradation of images by respiratory artifact). Echoshowed mildly reducedLV systolic function (reported as mildly decreased on myoview),EF 45-50% with moderate diastolic dysfunction. The patient opted for medical therapy.  She was diagnosed with paroxysmal atrial fibrillationandwas started on warfarin and diltiazem CD. Last echo in 8/15 showed EF 50-55%, moderate LV hypertrophy, probably mild mitral stenosis, normal RV size and systolic function. She was initially on dronedarone to try to maintain NSR, but developed persistent atrial fibrillation.She was changed toamiodarone and underwentcardioversion to NSR in 4/17but went back intoatrial fibrillation. The decision was made to pursue rate control and her amio was stopped. She also has a history of chronic diastolic CHF triggered by afib in the past.   She is here today for followup and is doing well.  She has chronic DOE but thinks it is do to trying to do things too fast  But she thinks it has increased some since I saw her last. She denies any chest pain or pressure,  PND, orthopnea, LE edema, dizziness, palpitations or syncope. She is compliant with her meds and is tolerating meds with no SE.      Past Medical History:  Diagnosis Date  . ABDOMINAL PAIN RIGHT UPPER QUADRANT 11/08/2008  . ALLERGIC RHINITIS 08/01/2009  . Anxiety   . Complication of anesthesia 01/31/2015   ? doesn't remember anything after being taken to pre surgery  . Coronary artery calcification seen on CAT scan 05/30/2009   moderate risk coronary calcium score by chest CT  . DDD (degenerative disc disease), lumbar   . DEPRESSION 06/13/2008  . DYSLIPIDEMIA 02/27/2009  . DYSPNEA 02/27/2009  . Dysrhythmia    Afib  . Frequency of urination   . GERD (gastroesophageal reflux disease)    "years ago"  . Head injury 2016   did not loose consciousness  . History of Bell's palsy   . History of DVT of lower extremity 2006-- CHRONIC COUMADIN THERAPY  . History of hiatal hernia   . History of Lyme disease   . 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 Hypertension   . NON-HODGKIN'S LYMPHOMA, HX OF dx  2005--  chemoradiation completed 2006--  no recurrence   onocologist- dr odogwu--   . OSTEOARTHRITIS, MODERATE 04/07/2010  . Permanent atrial fibrillation (HCC)    On chronic anti-coagulation with warfarin  . Pneumonia    x 3 - last time 02/2018  . Retroperitoneal fibrosis   . Stroke [facility])    ? - [place]- "you have had 3 strokes"    Past Surgical History:  Procedure Laterality Date  . BREAST MASS EXCISION     BENIGN-- RIGHT BREAST  . CARDIOVASCULAR STRESS TEST  04-04-2009-- PER PT  ASYMPTOMATIC-- MEDICAL MANAGEMENT   STUDY WAS EQUIVOCAL/ EF 49%/ GLOBAL HYPOKINESIS/ APPEARS TO BE A MILD ANTERIOR PERFUSION DEFECT COULD REPRESENT ISCHEMIA BUT DUE TO  ACTIVITY WORSE IN STRESS THAN AT REST POSS. THE DEFECT WAS ARTIFACTUAL  . CARDIOVERSION N/A 07/11/2015   Procedure: CARDIOVERSION;  Surgeon: Larey Dresser, MD;  Location: [place];  Service: Cardiovascular;  Laterality: N/A;  . CT ANGIOGRAM  FEB 2011   CALCIUM SCORE 161/ POSS. MODERATE MID LAD STENOSIS  . CYSTOSCOPY W/ RETROGRADES   04/28/2011   Procedure: CYSTOSCOPY WITH RETROGRADE PYELOGRAM;  Surgeon: Fredricka Bonine, MD;  Location: [facility];  Service: Urology;  Laterality: Left;  . CYSTOSCOPY W/ URETERAL STENT REMOVAL  04/28/2011   Procedure: CYSTOSCOPY WITH STENT REMOVAL;  Surgeon: Fredricka Bonine, MD;  Location: [facility];  Service: Urology;;  . EXPLORATORY LAPAROTOMY  2005   LYMPHOMA  . EYE SURGERY Bilateral    cataract   . GAS INSERTION Right 03/31/2018   Procedure: RIGHT EYE INSERTION OF GAS C3F8;  Surgeon: Bernarda Caffey, MD;  Location: [place];  Service: Ophthalmology;  Laterality: Right;  . KNEE ARTHROSCOPY  2005   RIGHT  . LASER PHOTO ABLATION Right 03/31/2018   Procedure: RIGHT EYE LASER PHOTO ABLATION;  Surgeon: Bernarda Caffey, MD;  Location: [place];  Service: Ophthalmology;  Laterality: Right;  . MULTIPLE CYSTO/ LEFT URETERAL STENT EXCHANGES  LAST ONE 10-28-10  . RETROPERITONEAL BX  2007  . REVERSE SHOULDER ARTHROPLASTY Right 01/31/2015   Procedure: RIGHT REVERSE SHOULDER ARTHROPLASTY;  Surgeon: Justice Britain, MD;  Location: [place];  Service: Orthopedics;  Laterality: Right;  . TONSILLECTOMY  CHILD  . TOTAL KNEE ARTHROPLASTY  OCT 2010   RIGHT  . TOTAL KNEE ARTHROPLASTY  JAN 2010   LEFT  . TRANSTHORACIC ECHOCARDIOGRAM  [id number]   MILD SYSTOLIC DYSFUNCTION/ EF [id number] MODERATE DIASTOLIC DYSFUCTION/ MILD MR/ MILD BIATRIAL ENLARGEMENT  . VITRECTOMY 25 GAUGE WITH SCLERAL BUCKLE Right 03/31/2018   Procedure: RIGHT VITRECTOMY 25 GAUGE WITH SCLERAL BUCKLE;  Surgeon: Bernarda Caffey, MD;  Location: [place];  Service: Ophthalmology;  Laterality: Right;    Current Medications: Current Meds  Medication Sig  . ALPRAZolam (XANAX) 1 MG tablet TAKE 1 TABLET BY MOUTH THREE TIMES A DAY AS NEEDED  . apixaban (ELIQUIS) 5 MG TABS tablet Take 1 tablet (5 mg total) by mouth 2 (two) times daily.  Marland Kitchen b complex vitamins capsule Take 2 capsules by mouth daily.   . Biotin 5 MG CAPS Take 1  capsule by mouth daily.   Marland Kitchen buPROPion (WELLBUTRIN XL) 300 MG 24 hr tablet TAKE 1 TABLET BY MOUTH EVERY DAY  . Cholecalciferol (VITAMIN D) 2000 UNITS CAPS Take 2,000 Int'l Units by mouth.  . Coenzyme Q10-Fish Oil-Vit E (CO-Q 10 OMEGA-3 FISH OIL PO) Take 100 mg by mouth daily.   Marland Kitchen diltiazem (CARDIZEM CD) 180 MG 24 hr capsule Take 1 capsule (180 mg total) by mouth daily. Please schedule overdue follow up appt for further refills [phone]. 3rd and final attempt  . doxycycline (VIBRAMYCIN) 100 MG capsule Take 100 mg by mouth 2 (two) times daily.  . DULoxetine (CYMBALTA) 60 MG capsule TAKE 1 CAPSULE BY MOUTH EVERY DAY  . fluticasone (FLONASE) 50 MCG/ACT nasal spray instill 2 sprays into each nostril once daily  . furosemide (LASIX) 20 MG tablet Take 1 tablet (20 mg total) by mouth daily. Needs appointment for refills. [phone]  . ketoconazole (NIZORAL) 2 % cream SMARTSIG:1 Application Topical 1 to  2 Times Daily  . loratadine (CLARITIN) 10 MG tablet Take 10 mg by mouth daily.   Marland Kitchen losartan (COZAAR) 25 MG tablet TAKE 1 TABLET BY MOUTH DAILY. PLEASE KEEP APPT WITH DR. Radford Pax FOR FUTURE REFILLS.  Marland Kitchen Lysine 500 MG TABS Take 500 mg by mouth daily.  . metoprolol succinate (TOPROL-XL) 25 MG 24 hr tablet Take 1 tablet (25 mg total) by mouth daily. Please schedule annual appt with Dr. Radford Pax for refills. [phone]. 2nd attempt.  . Pitavastatin Calcium (LIVALO) 2 MG TABS Take 1 tablet (2 mg total) by mouth daily. Please keep upcoming appt in April with Dr. Radford Pax before anymore refills. Thank you  . valACYclovir (VALTREX) 1000 MG tablet TAKE 2 TABLETS AT ONSET OF COLD SORES AND REPEAT 2 IN 12 HOURS.     Allergies:   Chlorhexidine base, Sulfa antibiotics, and Penicillins   Social History   Socioeconomic History  . Marital status: Married    Spouse name: Not on file  . Number of children: Not on file  . Years of education: Not on file  . Highest education level: Not on file  Occupational History   . Not on file  Tobacco Use  . Smoking status: Former Smoker    Packs/day: 2.00    Years: 52.00    Pack years: 104.00    Types: Cigarettes    Quit date: 03/24/2008    Years since quitting: 11.3  . Smokeless tobacco: Never Used  Substance and Sexual Activity  . Alcohol use: Not Currently    Comment: RARE  . Drug use: No  . Sexual activity: Not on file  Other Topics Concern  . Not on file  Social History Narrative  . Not on file   Social Determinants of Health   Financial Resource Strain:   . Difficulty of Paying Living Expenses:   Food Insecurity:   . Worried About Charity fundraiser in the Last Year:   . Arboriculturist in the Last Year:   Transportation Needs:   . Film/video editor (Medical):   Marland Kitchen Lack of Transportation (Non-Medical):   Physical Activity:   . Days of Exercise per Week:   . Minutes of Exercise per Session:   Stress:   . Feeling of Stress :   Social Connections:   . Frequency of Communication with Friends and Family:   . Frequency of Social Gatherings with Friends and Family:   . Attends Religious Services:   . Active Member of Clubs or Organizations:   . Attends Archivist Meetings:   Marland Kitchen Marital Status:      Family History: The patient's family history includes Arthritis in her mother; Diabetes in her sister; Heart attack in her mother and sister; Heart disease in her mother; Hypertension in her sister; Stroke in her sister.  ROS:   Please see the history of present illness.    ROS  All other systems reviewed and negative.   EKGs/Labs/Other Studies Reviewed:    The following studies were reviewed today: none  EKG:  EKG is  ordered today.  The ekg ordered today demonstrates atrial fibrillation with SVR at 50bpm , nonspecific IVCD  Recent Labs: No results found for requested labs within last 8760 hours.   Recent Lipid Panel    Component Value Date/Time   CHOL 142 02/01/2018 1217   TRIG 86 02/01/2018 1217   HDL 56  02/01/2018 1217   CHOLHDL 2.5 02/01/2018 1217   CHOLHDL 2.2 09/13/2015 1013  VLDL 17 09/13/2015 1013   LDLCALC 69 02/01/2018 1217    Physical Exam:    VS:  BP 130/72   Pulse 66   Ht 5\' 6"  (1.676 m)   Wt 227 lb 12.8 oz (103.3 kg)   SpO2 97%   BMI 36.77 kg/m     Wt Readings from Last 3 Encounters:  08/10/19 227 lb 12.8 oz (103.3 kg)  05/31/19 225 lb 6.4 oz (102.2 kg)  03/21/19 224 lb (101.6 kg)     GEN:  Well nourished, well developed in no acute distress HEENT: Normal NECK: No JVD; No carotid bruits LYMPHATICS: No lymphadenopathy CARDIAC: irregularly irregular and slow, no murmurs, rubs, gallops RESPIRATORY:  Clear to auscultation without rales, wheezing or rhonchi  ABDOMEN: Soft, non-tender, non-distended MUSCULOSKELETAL:  No edema; No deformity  SKIN: Warm and dry NEUROLOGIC:  Alert and oriented x 3 PSYCHIATRIC:  Normal affect   ASSESSMENT:    1. Coronary artery calcification seen on CAT scan   2. Dilated cardiomyopathy ([place])   3. Permanent atrial fibrillation ([place])   4. Nonrheumatic aortic valve stenosis   5. Essential hypertension   6. Chronic diastolic CHF (congestive heart failure) ([place])   7. Pure hypercholesterolemia    PLAN:    In order of problems listed above:  1.  Coronary artery calcification seen on CAT scan -she has not had any anginal sx -continue statin and BB -no ASA due to warfarin  2.  Dilated cardiomyopathy  -EF was 50-55% on echo 2018  3.  Permanent atrial fibrillation  -heart rate well controlled -denies any palpitations -continue Toprol XL 25mg  daily and warfarin  -decrease Cardizem to 120mg  daily due to bradycardia -repeat EKG in 1 week    4.  Mild aortic stenosis  -stable on echo 2018 with mean gradient 17mmH -repeat echo to make sure this is satble  5.  Hypertension  -BP well controlled on exam -continue Toprol XL 25mg  daily and Losartan 25mg  daily -check BP daily for a week and call with results since I am  decreasing her Cardizem due to bradycardia   6.  Chronic diastolic CHF - -she appears euvolemic on exam -weight is stable and she has no LE edema unless she forgets to take her diuretic -she has been having DOE -continue Lasix 20mg  daily  -Creatinine was 1.36 and K+ 4.3 in Jan 2020  7.  Hyperlipidemia  -LDL goal is less than 70.   -check FLP and ALT  -continue Livalo 2mg  daily    Medication Adjustments/Labs and Tests Ordered: Current medicines are reviewed at length with the patient today.  Concerns regarding medicines are outlined above.  Orders Placed This Encounter  Procedures  . EKG 12-Lead   No orders of the defined types were placed in this encounter.   Signed, Fransico Him, MD  08/10/2019 1:41 PM    [place]

## 2019-08-10 NOTE — Addendum Note (Signed)
Addended by: Antonieta Iba on: 08/10/2019 01:55 PM   Modules accepted: Orders

## 2019-08-10 NOTE — Patient Instructions (Signed)
Medication Instructions:  Your physician has recommended you make the following change in your medication:  1) DECREASE Cardizem to 120 mg daily    *If you need a refill on your cardiac medications before your next appointment, please call your pharmacy*   Lab Work: Fasting lipids and ALT in one week.  If you have labs (blood work) drawn today and your tests are completely normal, you will receive your results only by: Marland Kitchen MyChart Message (if you have MyChart) OR . A paper copy in the mail If you have any lab test that is abnormal or we need to change your treatment, we will call you to review the results.  Testing/Procedures: Your physician has requested that you have an echocardiogram. Echocardiography is a painless test that uses sound waves to create images of your heart. It provides your doctor with information about the size and shape of your heart and how well your heart's chambers and valves are working. This procedure takes approximately one hour. There are no restrictions for this procedure.  Follow-Up: At [facility], you and your health needs are our priority.  As part of our continuing mission to provide you with exceptional heart care, we have created designated Provider Care Teams.  These Care Teams include your primary Cardiologist (physician) and Advanced Practice Providers (APPs -  Physician Assistants and Nurse Practitioners) who all work together to provide you with the care you need, when you need it.  We recommend signing up for the patient portal called "MyChart".  Sign up information is provided on this After Visit Summary.  MyChart is used to connect with patients for Virtual Visits (Telemedicine).  Patients are able to view lab/test results, encounter notes, upcoming appointments, etc.  Non-urgent messages can be sent to your provider as well.   To learn more about what you can do with MyChart, go to [email or web address].    Your next appointment:   1  year(s)  The format for your next appointment:   In Person  Provider:   You may see Fransico Him, MD or one of the following Advanced Practice Providers on your designated Care Team:    Melina Copa, PA-C  Ermalinda Barrios, PA-C  Come back in one week for a nurse visit to have your BP checked and get an EKG. Please come to this appointment fasting to have blood work done as well.

## 2019-08-16 NOTE — Telephone Encounter (Signed)
Pt lab appt was moved to 08/17/2019 to have a BMET, CBC for Eliquis and other labs done.

## 2019-08-17 ENCOUNTER — Other Ambulatory Visit: Payer: Self-pay | Admitting: Cardiology

## 2019-08-17 ENCOUNTER — Ambulatory Visit: Payer: Medicare Other

## 2019-08-17 ENCOUNTER — Other Ambulatory Visit: Payer: Medicare Other

## 2019-08-17 ENCOUNTER — Telehealth: Payer: Self-pay

## 2019-08-17 NOTE — Telephone Encounter (Signed)
Left message for the patient to call back and reschedule her lab work and nurse visit.

## 2019-08-18 ENCOUNTER — Telehealth: Payer: Self-pay | Admitting: Family Medicine

## 2019-08-18 MED ORDER — LIVALO 2 MG PO TABS: 1.0000 | ORAL_TABLET | Freq: Every day | ORAL | 11 refills | Status: DC

## 2019-08-18 NOTE — Progress Notes (Signed)
  Chronic Care Management   Note  08/18/2019 Name: [name] MRN: [id number] DOB: 17-Mar-1946  [name] is a 74 y.o. year old female who is a primary care patient of Burchette, Alinda Sierras, MD. I reached out to [name] by phone today in response to a referral sent by [name]'s PCP, [name], MD.   [name] was given information about Chronic Care Management services today including:  1. CCM service includes personalized support from designated clinical staff supervised by her physician, including individualized plan of care and coordination with other care providers 2. 24/7 contact phone numbers for assistance for urgent and routine care needs. 3. Service will only be billed when office clinical staff spend 20 minutes or more in a month to coordinate care. 4. Only one practitioner may furnish and bill the service in a calendar month. 5. The patient may stop CCM services at any time (effective at the end of the month) by phone call to the office staff.   Patient did not agree to enrollment in care management services and does not wish to consider at this time.  This note is not being shared with the patient for the following reason: To respect privacy (The patient or proxy has requested that the information not be shared).  Follow up plan:  [place]

## 2019-08-18 NOTE — Telephone Encounter (Signed)
Pt did not show up for appts (lab and nurse visit) on yesterday and rescheduled to next week.

## 2019-08-22 ENCOUNTER — Telehealth: Payer: Self-pay | Admitting: Cardiology

## 2019-08-22 NOTE — Telephone Encounter (Signed)
Spoke with the patient and advised her that we will not mark her appointments as no shows. She has rescheduled her visits for tomorrow 06/02

## 2019-08-22 NOTE — Telephone Encounter (Signed)
New Message    Pt is calling and says she did call to change her appts on Thursday 05/27. When system was down. She wants to make sure  She is not marked as a no show for those appointments   Please advise

## 2019-08-23 ENCOUNTER — Ambulatory Visit (INDEPENDENT_AMBULATORY_CARE_PROVIDER_SITE_OTHER): Payer: Medicare Other

## 2019-08-23 ENCOUNTER — Other Ambulatory Visit: Payer: Self-pay

## 2019-08-23 ENCOUNTER — Ambulatory Visit: Payer: Medicare Other | Admitting: Podiatry

## 2019-08-23 ENCOUNTER — Other Ambulatory Visit: Payer: Self-pay | Admitting: Cardiology

## 2019-08-23 ENCOUNTER — Other Ambulatory Visit: Payer: Medicare Other | Admitting: *Deleted

## 2019-08-23 VITALS — BP 107/62 | HR 59 | Wt 225.0 lb

## 2019-08-23 DIAGNOSIS — I10 Essential (primary) hypertension: Secondary | ICD-10-CM

## 2019-08-23 DIAGNOSIS — E7800 Pure hypercholesterolemia, unspecified: Secondary | ICD-10-CM

## 2019-08-23 DIAGNOSIS — I420 Dilated cardiomyopathy: Secondary | ICD-10-CM

## 2019-08-23 DIAGNOSIS — I2510 Atherosclerotic heart disease of native coronary artery without angina pectoris: Secondary | ICD-10-CM

## 2019-08-23 DIAGNOSIS — I4821 Permanent atrial fibrillation: Secondary | ICD-10-CM

## 2019-08-23 DIAGNOSIS — Z79899 Other long term (current) drug therapy: Secondary | ICD-10-CM

## 2019-08-23 DIAGNOSIS — I350 Nonrheumatic aortic (valve) stenosis: Secondary | ICD-10-CM

## 2019-08-23 DIAGNOSIS — I5032 Chronic diastolic (congestive) heart failure: Secondary | ICD-10-CM

## 2019-08-23 DIAGNOSIS — I4819 Other persistent atrial fibrillation: Secondary | ICD-10-CM

## 2019-08-23 MED ORDER — METOPROLOL SUCCINATE ER 25 MG PO TB24: 25.0000 mg | ORAL_TABLET | Freq: Every day | ORAL | 3 refills | Status: DC

## 2019-08-23 MED ORDER — LOSARTAN POTASSIUM 25 MG PO TABS: ORAL_TABLET | ORAL | 3 refills | Status: DC

## 2019-08-23 MED ORDER — APIXABAN 5 MG PO TABS: 5.0000 mg | ORAL_TABLET | Freq: Two times a day (BID) | ORAL | 0 refills | Status: DC

## 2019-08-23 NOTE — Telephone Encounter (Signed)
Pt did have labs drawn today 08/23/19.  Pt's son called staing pt has ran out of Eliquis 2-3 days ago.  30 day refill sent to pharmacy, see refill request in Epic from 08/23/19.  Will refill 30 day supply x 6 months once lab work results back from lab and dosage calculated to ensure pt is on appropriate dosage of Eliquis 5mg  BID.

## 2019-08-23 NOTE — Telephone Encounter (Signed)
*  STAT* If patient is at the pharmacy, call can be transferred to refill team.   1. Which medications need to be refilled? (please list name of each medication and dose if known) apixaban (ELIQUIS) 5 MG TABS tablet  2. Which pharmacy/location (including street and city if local pharmacy) is medication to be sent to? [id number] - [place], [place] - [place]  3. Do they need a 30 day or 90 day supply? 90   Patient is out of medication

## 2019-08-23 NOTE — Telephone Encounter (Signed)
Called spoke with pt's son Herbie [place] advised we were not aware she was out of Eliquis, last discussion with pt she had enough to last her until she had labwork done for refill.  He states she has been out x 2-3 days.  Advised I will send in refill x 30 day supply now.  Pt did have labs drawn today in our office results pending.  Advised son we will review labs once back and calculate dosage to make sure pt is on appropriate dosage of Eliquis and then send in additional refills.  Pt's son thanked me and states when we refill the rx could we only send in a 30 day supply, 90 day supply is sometimes not affordable.  Advised son we would be glad to do that once labwork is back.  Pt's age is 68 and weight is 102.1kg, based on specified criteria pt is on appropriate dosage of Eliquis 5mg  BID.  Will refill rx.

## 2019-08-23 NOTE — Telephone Encounter (Signed)
Patient's son calling back stating the pharmacy still has not yet received the refill. He states the patient has been out of medication for the past few days.

## 2019-08-23 NOTE — Progress Notes (Signed)
1.) Reason for visit: EKG/BP check  2.) Name of MD requesting visit: Dr. Radford Pax*  3.) H&P: Pt has permanent atrial fibrillation, recent decrease in Cardizem due to bradycardia **  4.) ROS related to problem: Pt reports no concerns, she states she has been feeling well.**  5.) Assessment and plan per MD: Reviewed by DOD Dr. Johnsie Cancel. Will route to Dr. Radford Pax for review as well.*

## 2019-08-24 LAB — LIPID PANEL
Chol/HDL Ratio: 2.4 ratio (ref 0.0–4.4)
Cholesterol, Total: 123 mg/dL (ref 100–199)
HDL: 51 mg/dL (ref >39)
LDL Chol Calc (NIH): 57 mg/dL (ref 0–99)
Triglycerides: 76 mg/dL (ref 0–149)
VLDL Cholesterol Cal: 15 mg/dL (ref 5–40)

## 2019-08-24 LAB — BASIC METABOLIC PANEL
BUN/Creatinine Ratio: 18 (ref 12–28)
BUN: 21 mg/dL (ref 8–27)
CO2: 23 mmol/L (ref 20–29)
Calcium: 8.9 mg/dL (ref 8.7–10.3)
Chloride: 108 mmol/L — ABNORMAL HIGH (ref 96–106)
Creatinine, Ser: 1.18 mg/dL — ABNORMAL HIGH (ref 0.57–1.00)
GFR calc Af Amer: 52 mL/min/{1.73_m2} — ABNORMAL LOW (ref >59)
GFR calc non Af Amer: 45 mL/min/{1.73_m2} — ABNORMAL LOW (ref >59)
Glucose: 100 mg/dL — ABNORMAL HIGH (ref 65–99)
Potassium: 4.5 mmol/L (ref 3.5–5.2)
Sodium: 143 mmol/L (ref 134–144)

## 2019-08-24 LAB — CBC
Hematocrit: 36.4 % (ref 34.0–46.6)
Hemoglobin: 12.4 g/dL (ref 11.1–15.9)
MCH: 30.7 pg (ref 26.6–33.0)
MCHC: 34.1 g/dL (ref 31.5–35.7)
MCV: 90 fL (ref 79–97)
Platelets: 188 10*3/uL (ref 150–450)
RBC: 4.04 x10E6/uL (ref 3.77–5.28)
RDW: 12.8 % (ref 11.7–15.4)
WBC: 6.1 10*3/uL (ref 3.4–10.8)

## 2019-08-24 LAB — ALT: ALT: 14 IU/L (ref 0–32)

## 2019-08-24 MED ORDER — APIXABAN 5 MG PO TABS: 5.0000 mg | ORAL_TABLET | Freq: Two times a day (BID) | ORAL | 5 refills | Status: DC

## 2019-08-24 NOTE — Telephone Encounter (Signed)
Labs resulted and pt requests Eliquis 5mg  30 day supply with refills. 30 days was sent in per pt request yesterday and since labs are back will send.  Patient is 74 years old, weight-102.1kg, Crea-1.18 on 08/23/2019, Diagnosis-Afib, and last seen by Dr. Radford Pax on 08/10/19. Dose is appropriate based on dosing criteria. Will send in refill to requested pharmacy.

## 2019-09-04 ENCOUNTER — Ambulatory Visit (HOSPITAL_COMMUNITY): Payer: Medicare Other | Attending: Cardiology

## 2019-09-04 ENCOUNTER — Other Ambulatory Visit: Payer: Self-pay

## 2019-09-04 DIAGNOSIS — I420 Dilated cardiomyopathy: Secondary | ICD-10-CM | POA: Insufficient documentation

## 2019-09-04 DIAGNOSIS — I350 Nonrheumatic aortic (valve) stenosis: Secondary | ICD-10-CM | POA: Diagnosis not present

## 2019-09-04 DIAGNOSIS — I2510 Atherosclerotic heart disease of native coronary artery without angina pectoris: Secondary | ICD-10-CM | POA: Diagnosis not present

## 2019-09-04 DIAGNOSIS — I5032 Chronic diastolic (congestive) heart failure: Secondary | ICD-10-CM | POA: Insufficient documentation

## 2019-09-04 DIAGNOSIS — E7800 Pure hypercholesterolemia, unspecified: Secondary | ICD-10-CM | POA: Insufficient documentation

## 2019-09-04 DIAGNOSIS — I10 Essential (primary) hypertension: Secondary | ICD-10-CM | POA: Diagnosis not present

## 2019-09-04 DIAGNOSIS — I4821 Permanent atrial fibrillation: Secondary | ICD-10-CM | POA: Insufficient documentation

## 2019-09-11 ENCOUNTER — Telehealth: Payer: Self-pay | Admitting: Cardiology

## 2019-09-11 ENCOUNTER — Telehealth: Payer: Self-pay

## 2019-09-11 DIAGNOSIS — I420 Dilated cardiomyopathy: Secondary | ICD-10-CM

## 2019-09-11 DIAGNOSIS — I5032 Chronic diastolic (congestive) heart failure: Secondary | ICD-10-CM

## 2019-09-11 NOTE — Telephone Encounter (Signed)
-----   Message from Sueanne Margarita, MD sent at 09/04/2019  5:54 PM EDT ----- Echo showed low normal heart function with mildly thickened heart muscle, mildly reduced RV function, enlarged atrial, mild AS - repeat echo in 1 year for AS

## 2019-09-11 NOTE — Telephone Encounter (Signed)
Patient returning Carly's call in regards to echo results.

## 2019-09-11 NOTE — Telephone Encounter (Signed)
The patient has been notified of the result and verbalized understanding.  All questions (if any) were answered. Antonieta Iba, RN 09/11/2019 4:47 PM

## 2019-09-11 NOTE — Telephone Encounter (Signed)
The patient has been notified of the result and verbalized understanding.  All questions (if any) were answered. Antonieta Iba, RN 09/11/2019 4:48 PM

## 2019-09-21 ENCOUNTER — Telehealth: Payer: Self-pay | Admitting: Family Medicine

## 2019-09-21 NOTE — Telephone Encounter (Signed)
Left message for patient to schedule Annual Wellness Visit.  Please schedule with Nurse Health Advisor Shannon Crews, RN at  [facility]  

## 2019-09-27 ENCOUNTER — Other Ambulatory Visit: Payer: Self-pay | Admitting: Family Medicine

## 2019-09-27 NOTE — Telephone Encounter (Signed)
Last ov:05/31/19 please advise

## 2019-10-10 ENCOUNTER — Other Ambulatory Visit: Payer: Self-pay | Admitting: Family Medicine

## 2019-10-10 NOTE — Telephone Encounter (Signed)
Would not refill without further clarification.  If she is wanting to go back on this would need follow-up to discuss

## 2019-10-10 NOTE — Telephone Encounter (Signed)
Please advise not prescribed since 2019

## 2019-10-11 ENCOUNTER — Ambulatory Visit: Payer: Medicare Other | Admitting: Podiatry

## 2019-10-12 ENCOUNTER — Other Ambulatory Visit: Payer: Self-pay | Admitting: Family Medicine

## 2019-10-24 ENCOUNTER — Telehealth: Payer: Self-pay | Admitting: Cardiology

## 2019-10-24 NOTE — Telephone Encounter (Signed)
Patient called stating she accidentally deleted all her voicemail's and was unsure if Carly had called her recently. I informed her the last call was in June.

## 2019-10-31 ENCOUNTER — Other Ambulatory Visit: Payer: Self-pay

## 2019-10-31 ENCOUNTER — Ambulatory Visit (INDEPENDENT_AMBULATORY_CARE_PROVIDER_SITE_OTHER): Payer: Medicare Other | Admitting: Podiatry

## 2019-10-31 DIAGNOSIS — D689 Coagulation defect, unspecified: Secondary | ICD-10-CM | POA: Diagnosis not present

## 2019-10-31 DIAGNOSIS — B351 Tinea unguium: Secondary | ICD-10-CM | POA: Diagnosis not present

## 2019-10-31 DIAGNOSIS — L84 Corns and callosities: Secondary | ICD-10-CM | POA: Diagnosis not present

## 2019-10-31 DIAGNOSIS — M79676 Pain in unspecified toe(s): Secondary | ICD-10-CM | POA: Diagnosis not present

## 2019-11-01 ENCOUNTER — Encounter: Payer: Self-pay | Admitting: Podiatry

## 2019-11-01 NOTE — Progress Notes (Signed)
Subjective: Patient presents today with diabetes and cc of painful, discolored, thick toenails which interfere with daily activities. Pain is aggravated when wearing enclosed shoe gear. Pain is getting progressively worse and relieved with periodic professional debridement.  She states she missed a couple of appointments due to writing down the wrong date/time on her calendar. She states the left 2nd toe is tender, but it did close.  She voices no new pedal concerns on today's visit.  [name], MD is her PCP.   Current Outpatient Medications on File Prior to Visit  Medication Sig Dispense Refill  . Acetaminophen-Codeine 300-30 MG tablet Take 1 tablet by mouth every 6 (six) hours as needed.    . ALPRAZolam (XANAX) 1 MG tablet TAKE 1 TABLET BY MOUTH THREE TIMES A DAY AS NEEDED 90 tablet 2  . apixaban (ELIQUIS) 5 MG TABS tablet Take 1 tablet (5 mg total) by mouth 2 (two) times daily. 60 tablet 5  . apixaban (ELIQUIS) 5 MG TABS tablet Take 1 tablet (5 mg total) by mouth 2 (two) times daily. 60 tablet 0  . b complex vitamins capsule Take 2 capsules by mouth daily.     . Biotin 5 MG CAPS Take 1 capsule by mouth daily.     Marland Kitchen buPROPion (WELLBUTRIN XL) 300 MG 24 hr tablet TAKE 1 TABLET BY MOUTH EVERY DAY 90 tablet 0  . Cholecalciferol (VITAMIN D) 2000 UNITS CAPS Take 2,000 Int'l Units by mouth.    . ciclopirox (PENLAC) 8 % solution SMARTSIG:1 Milliliter(s) Topical Daily    . clindamycin (CLEOCIN) 300 MG capsule Take 300 mg by mouth every 8 (eight) hours.    . Coenzyme Q10-Fish Oil-Vit E (CO-Q 10 OMEGA-3 FISH OIL PO) Take 100 mg by mouth daily.     Marland Kitchen diltiazem (CARDIZEM CD) 120 MG 24 hr capsule Take 1 capsule (120 mg total) by mouth daily. 90 capsule 3  . doxycycline (VIBRA-TABS) 100 MG tablet Take 100 mg by mouth 2 (two) times daily.    Marland Kitchen doxycycline (VIBRAMYCIN) 100 MG capsule Take 100 mg by mouth 2 (two) times daily.    . DULoxetine (CYMBALTA) 60 MG capsule TAKE 1 CAPSULE BY MOUTH EVERY  DAY 90 capsule 0  . ELIQUIS 5 MG TABS tablet TAKE 1 TABLET BY MOUTH TWICE A DAY 60 tablet 5  . fluticasone (FLONASE) 50 MCG/ACT nasal spray instill 2 sprays into each nostril once daily 16 g 6  . furosemide (LASIX) 20 MG tablet TAKE 1 TABLET (20 MG TOTAL) BY MOUTH DAILY. NEEDS APPOINTMENT FOR REFILLS. [phone] 30 tablet 0  . ketoconazole (NIZORAL) 2 % cream SMARTSIG:1 Application Topical 1 to 2 Times Daily    . loratadine (CLARITIN) 10 MG tablet Take 10 mg by mouth daily.     Marland Kitchen losartan (COZAAR) 25 MG tablet TAKE 1 TABLET BY MOUTH DAILY. 90 tablet 3  . Lysine 500 MG TABS Take 500 mg by mouth daily.    . metoprolol succinate (TOPROL-XL) 25 MG 24 hr tablet Take 1 tablet (25 mg total) by mouth daily. 90 tablet 3  . Pitavastatin Calcium (LIVALO) 2 MG TABS Take 1 tablet (2 mg total) by mouth daily. 30 tablet 11  . terbinafine (LAMISIL) 250 MG tablet Take 250 mg by mouth daily.    . valACYclovir (VALTREX) 1000 MG tablet TAKE 2 TABLETS AT ONSET OF COLD SORES AND REPEAT 2 IN 12 HOURS. 30 tablet 1   No current facility-administered medications on file prior to visit.  Allergies  Allergen Reactions  . Chlorhexidine Base Itching    CHG WIPES  . Sulfa Antibiotics Other (See Comments)    Severe GI upset  . Penicillins Hives and Rash    DID THE REACTION INVOLVE: Swelling of the face/tongue/throat, SOB, or low BP? No Sudden or severe rash/hives, skin peeling, or the inside of the mouth or nose? No Did it require medical treatment? No When did it last happen?90s If all above answers are "NO", may proceed with cephalosporin use.      Objective: There were no vitals filed for this visit.  74 y.o. Caucasian female, obese, in NAD. AAO x 3.  Vascular Examination: Capillary refill time to digits immediate b/l. Palpable DP pulses b/l. Palpable PT pulses b/l. Pedal hair present b/l. Skin temperature gradient within normal limits b/l. Unilateral edema left LE.  Dermatological  Examination: Pedal skin with normal turgor, texture and tone bilaterally. No interdigital macerations bilaterally. Toenails 1-5 b/l elongated, discolored, dystrophic, thickened, crumbly with subungual debris and tenderness to dorsal palpation. Left 2nd digit with preulcerative lesion with subdermal hemorrhage. No edema, no erythema, no drainage, no fluctuance.  Musculoskeletal Examination: Normal muscle strength 5/5 to all lower extremity muscle groups bilaterally. No pain crepitus or joint limitation noted with ROM b/l. Hammertoes noted to the L 3rd toe and R 4th toe.  Neurological Examination: Protective sensation intact 5/5 intact bilaterally with 10g monofilament b/l. Vibratory sensation intact b/l.  Assessment: Pain due to onychomycosis of toenail  Corns  Coagulation defect (HCC)  Plan: -Examined patient. -Toenails 1-5 b/l were debrided in length and girth with sterile nail nippers and dremel without iatrogenic bleeding.  -Preulcerative corn left 2nd toe pared without complication. Dispensed silicone toe cap for daily protection. Apply every morning, remove every evening. -Patient to continue soft, supportive shoe gear daily. -Patient to report any pedal injuries to medical professional immediately.   Marzetta Board, DPM

## 2019-11-04 ENCOUNTER — Other Ambulatory Visit: Payer: Self-pay | Admitting: Family Medicine

## 2019-11-29 DIAGNOSIS — H353132 Nonexudative age-related macular degeneration, bilateral, intermediate dry stage: Secondary | ICD-10-CM | POA: Diagnosis not present

## 2019-11-29 DIAGNOSIS — H04123 Dry eye syndrome of bilateral lacrimal glands: Secondary | ICD-10-CM | POA: Diagnosis not present

## 2019-11-29 DIAGNOSIS — Z961 Presence of intraocular lens: Secondary | ICD-10-CM | POA: Diagnosis not present

## 2019-11-29 DIAGNOSIS — H524 Presbyopia: Secondary | ICD-10-CM | POA: Diagnosis not present

## 2019-12-05 ENCOUNTER — Other Ambulatory Visit: Payer: Self-pay | Admitting: Family Medicine

## 2019-12-19 ENCOUNTER — Other Ambulatory Visit: Payer: Self-pay | Admitting: Family Medicine

## 2019-12-21 ENCOUNTER — Other Ambulatory Visit: Payer: Self-pay | Admitting: Family Medicine

## 2019-12-21 ENCOUNTER — Telehealth: Payer: Self-pay | Admitting: Family Medicine

## 2019-12-21 DIAGNOSIS — F324 Major depressive disorder, single episode, in partial remission: Secondary | ICD-10-CM

## 2019-12-21 NOTE — Telephone Encounter (Signed)
pt need a refill  ALPRAZolam (XANAX) 1 MG tablet buPROPion (WELLBUTRIN XL) 300 MG 24 hr tablet [id number] - [place], [place] - [address]  Phone:  [phone] Fax:  [phone]

## 2019-12-21 NOTE — Telephone Encounter (Signed)
  pt want to know can she have a flu shot and booster shot at the same time; please advise  [phone]

## 2019-12-22 ENCOUNTER — Other Ambulatory Visit: Payer: Self-pay | Admitting: Family Medicine

## 2019-12-22 MED ORDER — BUPROPION HCL ER (XL) 300 MG PO TB24: 300.0000 mg | ORAL_TABLET | Freq: Every day | ORAL | 0 refills | Status: DC

## 2019-12-22 NOTE — Telephone Encounter (Signed)
Pharmacy sent refill request for the alprazolam. I am closing this note.   The refill request has been sent to PCP to advise.

## 2019-12-22 NOTE — Telephone Encounter (Signed)
Per database alprazolam was last filled on 11/24/2019.   Pt last saw PCP on 06/05/2019. No upcoming appointment scheduled at this time.   Please advise

## 2019-12-22 NOTE — Telephone Encounter (Signed)
They can be given at same time per CDC guidelines.

## 2019-12-22 NOTE — Telephone Encounter (Signed)
Tried to call patient. Not able to leave VM.

## 2019-12-22 NOTE — Telephone Encounter (Signed)
Is there any reason why patient could not have her flu vaccine and COVID booster at the same time?

## 2019-12-25 NOTE — Telephone Encounter (Signed)
Patient contacted a informed of PCP response. Pt stated that she doesn't know that she will get both at the same time and will space them apart.

## 2020-01-01 ENCOUNTER — Encounter: Payer: Self-pay | Admitting: Podiatry

## 2020-01-01 ENCOUNTER — Other Ambulatory Visit: Payer: Self-pay

## 2020-01-01 ENCOUNTER — Ambulatory Visit (INDEPENDENT_AMBULATORY_CARE_PROVIDER_SITE_OTHER): Payer: Medicare Other | Admitting: Podiatry

## 2020-01-01 DIAGNOSIS — M79676 Pain in unspecified toe(s): Secondary | ICD-10-CM

## 2020-01-01 DIAGNOSIS — Z9229 Personal history of other drug therapy: Secondary | ICD-10-CM

## 2020-01-01 DIAGNOSIS — D689 Coagulation defect, unspecified: Secondary | ICD-10-CM

## 2020-01-01 DIAGNOSIS — L84 Corns and callosities: Secondary | ICD-10-CM

## 2020-01-01 DIAGNOSIS — B351 Tinea unguium: Secondary | ICD-10-CM

## 2020-01-02 NOTE — Progress Notes (Signed)
Subjective: Patient presents today with h/o DVT with c/o corns distal tip of digits 2-4 b/l feet and  painful, discolored, thick toenails which interfere with daily activities. Pain is aggravated when wearing enclosed shoe gear. Pain is getting progressively worse and relieved with periodic professional debridement.  She is on blood thinner, Eliquis.  [name], MD is her PCP.   Current Outpatient Medications on File Prior to Visit  Medication Sig Dispense Refill  . Acetaminophen-Codeine 300-30 MG tablet Take 1 tablet by mouth every 6 (six) hours as needed.    . ALPRAZolam (XANAX) 1 MG tablet TAKE 1 TABLET BY MOUTH THREE TIMES A DAY AS NEEDED 90 tablet 2  . apixaban (ELIQUIS) 5 MG TABS tablet Take 1 tablet (5 mg total) by mouth 2 (two) times daily. 60 tablet 5  . b complex vitamins capsule Take 2 capsules by mouth daily.     . Biotin 5 MG CAPS Take 1 capsule by mouth daily.     Marland Kitchen buPROPion (WELLBUTRIN XL) 300 MG 24 hr tablet Take 1 tablet (300 mg total) by mouth daily. 90 tablet 0  . Cholecalciferol (VITAMIN D) 2000 UNITS CAPS Take 2,000 Int'l Units by mouth.    . ciclopirox (PENLAC) 8 % solution SMARTSIG:1 Milliliter(s) Topical Daily    . clindamycin (CLEOCIN) 300 MG capsule Take 300 mg by mouth every 8 (eight) hours.    . Coenzyme Q10-Fish Oil-Vit E (CO-Q 10 OMEGA-3 FISH OIL PO) Take 100 mg by mouth daily.     Marland Kitchen diltiazem (CARDIZEM CD) 120 MG 24 hr capsule Take 1 capsule (120 mg total) by mouth daily. 90 capsule 3  . doxycycline (VIBRA-TABS) 100 MG tablet Take 100 mg by mouth 2 (two) times daily.    Marland Kitchen doxycycline (VIBRAMYCIN) 100 MG capsule Take 100 mg by mouth 2 (two) times daily.    . DULoxetine (CYMBALTA) 60 MG capsule TAKE 1 CAPSULE BY MOUTH EVERY DAY 90 capsule 0  . fluticasone (FLONASE) 50 MCG/ACT nasal spray instill 2 sprays into each nostril once daily 16 g 6  . furosemide (LASIX) 20 MG tablet TAKE 1 TABLET (20 MG TOTAL) BY MOUTH DAILY. NEEDS APPOINTMENT FOR REFILLS.  [phone] 30 tablet 0  . ketoconazole (NIZORAL) 2 % cream SMARTSIG:1 Application Topical 1 to 2 Times Daily    . loratadine (CLARITIN) 10 MG tablet Take 10 mg by mouth daily.     Marland Kitchen losartan (COZAAR) 25 MG tablet TAKE 1 TABLET BY MOUTH DAILY. 90 tablet 3  . Lysine 500 MG TABS Take 500 mg by mouth daily.    . metoprolol succinate (TOPROL-XL) 25 MG 24 hr tablet Take 1 tablet (25 mg total) by mouth daily. 90 tablet 3  . Pitavastatin Calcium (LIVALO) 2 MG TABS Take 1 tablet (2 mg total) by mouth daily. 30 tablet 11  . terbinafine (LAMISIL) 250 MG tablet Take 250 mg by mouth daily.    . valACYclovir (VALTREX) 1000 MG tablet TAKE 2 TABLETS AT ONSET OF COLD SORES AND REPEAT 2 IN 12 HOURS. 30 tablet 1   No current facility-administered medications on file prior to visit.     Allergies  Allergen Reactions  . Chlorhexidine Base Itching    CHG WIPES  . Sulfa Antibiotics Other (See Comments)    Severe GI upset  . Penicillins Hives and Rash    DID THE REACTION INVOLVE: Swelling of the face/tongue/throat, SOB, or low BP? No Sudden or severe rash/hives, skin peeling, or the inside of the mouth or  nose? No Did it require medical treatment? No When did it last happen?90s If all above answers are "NO", may proceed with cephalosporin use.      Objective: There were no vitals filed for this visit.  74 y.o. Caucasian female, obese, in NAD. AAO x 3.  Vascular Examination: Capillary refill time to digits immediate b/l. Palpable DP pulses b/l. Palpable PT pulses b/l. Pedal hair present b/l. Skin temperature gradient within normal limits b/l. Unilateral edema left LE.  Dermatological Examination: Pedal skin with normal turgor, texture and tone bilaterally. No interdigital macerations bilaterally. Toenails 1-5 b/l elongated, discolored, dystrophic, thickened, crumbly with subungual debris and tenderness to dorsal palpation. Digits 2-4 b/l  preulcerative lesion with visible subdermal hemorrhage. No  edema, no erythema, no drainage, no fluctuance.   Musculoskeletal Examination: Normal muscle strength 5/5 to all lower extremity muscle groups bilaterally. No pain crepitus or joint limitation noted with ROM b/l. Hammertoes noted to the L 3rd toe and R 4th toe.  Neurological Examination: Protective sensation intact 5/5 intact bilaterally with 10g monofilament b/l. Vibratory sensation intact b/l.  Assessment: Pain due to onychomycosis of toenail  Pre-ulcerative corn or callous  Hx of long term use of blood thinners  Clotting disorder ([place])  Plan: -Examined patient. -We discussed her chronic preulcerative corns. Discussed consulting one of our surgeons for assessment and recommendations for definitive treatment. She would like to hold off for now.  -Toenails 1-5 b/l were debrided in length and girth with sterile nail nippers and dremel without iatrogenic bleeding.  -Preulcerative corns digits 2-4 b/l pared without complication. Dispensed silicone toe cap for daily protection. Apply every morning, remove every evening. -Patient to continue soft, supportive shoe gear daily -Follow up 9 weeks. -Patient to report any pedal injuries to medical professional immediately.   Marzetta Board, DPM

## 2020-01-16 ENCOUNTER — Other Ambulatory Visit: Payer: Self-pay | Admitting: Family Medicine

## 2020-01-20 ENCOUNTER — Telehealth: Payer: Self-pay | Admitting: Podiatry

## 2020-01-20 MED ORDER — DOXYCYCLINE HYCLATE 100 MG PO TABS: 100.0000 mg | ORAL_TABLET | Freq: Two times a day (BID) | ORAL | 0 refills | Status: DC

## 2020-01-20 NOTE — Telephone Encounter (Signed)
Patient called stating that at the tip of the toe were there was an apparent corn it sounds like is starting to become painful again. She states it has been scraped previously but it is back to hurting again. She states it is swollen at the tip of the toe but no redness, drainage, red streaks. Denies any fevers, chills. I went ahead and sent doxycyline to the pharmacy. Recommended a small amount of antibiotic ointment around the area. If there is any worsening signs of infection call us back or go to the ER. She verbalized understanding.   Message sent to schedulers to get her in this coming week.

## 2020-01-25 ENCOUNTER — Ambulatory Visit (INDEPENDENT_AMBULATORY_CARE_PROVIDER_SITE_OTHER): Payer: Medicare Other

## 2020-01-25 ENCOUNTER — Other Ambulatory Visit: Payer: Self-pay

## 2020-01-25 DIAGNOSIS — Z1211 Encounter for screening for malignant neoplasm of colon: Secondary | ICD-10-CM

## 2020-01-25 DIAGNOSIS — I4821 Permanent atrial fibrillation: Secondary | ICD-10-CM | POA: Diagnosis not present

## 2020-01-25 DIAGNOSIS — I420 Dilated cardiomyopathy: Secondary | ICD-10-CM | POA: Diagnosis not present

## 2020-01-25 DIAGNOSIS — Z1231 Encounter for screening mammogram for malignant neoplasm of breast: Secondary | ICD-10-CM

## 2020-01-25 DIAGNOSIS — Z780 Asymptomatic menopausal state: Secondary | ICD-10-CM

## 2020-01-25 DIAGNOSIS — Z0000 Encounter for general adult medical examination without abnormal findings: Secondary | ICD-10-CM

## 2020-01-25 DIAGNOSIS — I10 Essential (primary) hypertension: Secondary | ICD-10-CM | POA: Diagnosis not present

## 2020-01-25 NOTE — Progress Notes (Signed)
Subjective:   [name] is a 74 y.o. female who presents for Medicare Annual (Subsequent) preventive examination.  I connected with Tessy Pawelski today by telephone and verified that I am speaking with the correct person using two identifiers. Location patient: home Location provider: work Persons participating in the virtual visit: patient, provider.   I discussed the limitations, risks, security and privacy concerns of performing an evaluation and management service by telephone and the availability of in person appointments. I also discussed with the patient that there may be a patient responsible charge related to this service. The patient expressed understanding and verbally consented to this telephonic visit.    Interactive audio and video telecommunications were attempted between this provider and patient, however failed, due to patient having technical difficulties OR patient did not have access to video capability.  We continued and completed visit with audio only.      Review of Systems    N/A  Cardiac Risk Factors include: advanced age (>67men, >2 women);hypertension;dyslipidemia     Objective:    There were no vitals filed for this visit. There is no height or weight on file to calculate BMI.  Advanced Directives 01/25/2020 04/06/2018 03/31/2018 04/16/2017 07/11/2015 02/03/2015 01/25/2015  Does Patient Have a Medical Advance Directive? Yes Yes Yes Yes Yes No Yes  Type of Paramedic of [place];Living will Living will;Healthcare Power of [place];Living will - Healthcare Power of Attorney Living will Living will  Does patient want to make changes to medical advance directive? No - Patient declined - No - Patient declined - - - -  Copy of [place] in Chart? No - copy requested No - copy requested No - copy requested - No - copy requested No - copy requested No - copy requested  Would patient  like information on creating a medical advance directive? - - - - - No - patient declined information -    Current Medications (verified) Outpatient Encounter Medications as of 01/25/2020  Medication Sig  . ALPRAZolam (XANAX) 1 MG tablet TAKE 1 TABLET BY MOUTH THREE TIMES A DAY AS NEEDED  . apixaban (ELIQUIS) 5 MG TABS tablet Take 1 tablet (5 mg total) by mouth 2 (two) times daily.  Marland Kitchen b complex vitamins capsule Take 2 capsules by mouth daily.   Marland Kitchen buPROPion (WELLBUTRIN XL) 300 MG 24 hr tablet Take 1 tablet (300 mg total) by mouth daily.  . Cholecalciferol (VITAMIN D) 2000 UNITS CAPS Take 2,000 Int'l Units by mouth.  . ciclopirox (PENLAC) 8 % solution SMARTSIG:1 Milliliter(s) Topical Daily  . Coenzyme Q10-Fish Oil-Vit E (CO-Q 10 OMEGA-3 FISH OIL PO) Take 100 mg by mouth daily.   Marland Kitchen diltiazem (CARDIZEM CD) 120 MG 24 hr capsule Take 1 capsule (120 mg total) by mouth daily.  Marland Kitchen doxycycline (VIBRA-TABS) 100 MG tablet Take 1 tablet (100 mg total) by mouth 2 (two) times daily.  . fluticasone (FLONASE) 50 MCG/ACT nasal spray instill 2 sprays into each nostril once daily  . furosemide (LASIX) 20 MG tablet TAKE 1 TABLET (20 MG TOTAL) BY MOUTH DAILY. NEEDS APPOINTMENT FOR REFILLS. [phone]  . ketoconazole (NIZORAL) 2 % cream SMARTSIG:1 Application Topical 1 to 2 Times Daily  . loratadine (CLARITIN) 10 MG tablet Take 10 mg by mouth daily.   Marland Kitchen losartan (COZAAR) 25 MG tablet TAKE 1 TABLET BY MOUTH DAILY.  Marland Kitchen Lysine 500 MG TABS Take 500 mg by mouth daily.  . metoprolol succinate (TOPROL-XL) 25  MG 24 hr tablet Take 1 tablet (25 mg total) by mouth daily.  . Pitavastatin Calcium (LIVALO) 2 MG TABS Take 1 tablet (2 mg total) by mouth daily.  . valACYclovir (VALTREX) 1000 MG tablet TAKE 2 TABLETS AT ONSET OF COLD SORES AND REPEAT 2 IN 12 HOURS.  Marland Kitchen Acetaminophen-Codeine 300-30 MG tablet Take 1 tablet by mouth every 6 (six) hours as needed. (Patient not taking: Reported on 01/25/2020)  . Biotin 5 MG CAPS Take 1  capsule by mouth daily.  (Patient not taking: Reported on 01/25/2020)  . DULoxetine (CYMBALTA) 60 MG capsule TAKE 1 CAPSULE BY MOUTH EVERY DAY (Patient not taking: Reported on 01/25/2020)  . terbinafine (LAMISIL) 250 MG tablet Take 250 mg by mouth daily. (Patient not taking: Reported on 01/25/2020)  . [DISCONTINUED] clindamycin (CLEOCIN) 300 MG capsule Take 300 mg by mouth every 8 (eight) hours.  . [DISCONTINUED] doxycycline (VIBRA-TABS) 100 MG tablet Take 100 mg by mouth 2 (two) times daily.  . [DISCONTINUED] doxycycline (VIBRAMYCIN) 100 MG capsule Take 100 mg by mouth 2 (two) times daily.   No facility-administered encounter medications on file as of 01/25/2020.    Allergies (verified) Chlorhexidine base, Clindamycin/lincomycin, Sulfa antibiotics, and Penicillins   History: Past Medical History:  Diagnosis Date  . ABDOMINAL PAIN RIGHT UPPER QUADRANT 11/08/2008  . ALLERGIC RHINITIS 08/01/2009  . Anxiety   . Complication of anesthesia 01/31/2015   ? doesn't remember anything after being taken to pre surgery  . Coronary artery calcification seen on CAT scan 05/30/2009   moderate risk coronary calcium score by chest CT  . DDD (degenerative disc disease), lumbar   . DEPRESSION 06/13/2008  . DYSLIPIDEMIA 02/27/2009  . DYSPNEA 02/27/2009  . Dysrhythmia    Afib  . Frequency of urination   . GERD (gastroesophageal reflux disease)    "years ago"  . Head injury 2016   did not loose consciousness  . History of Bell's palsy   . History of DVT of lower extremity 2006-- CHRONIC COUMADIN THERAPY  . History of hiatal hernia   . History of Lyme disease   . 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 Hypertension   . NON-HODGKIN'S LYMPHOMA, HX OF dx  2005--  chemoradiation completed 2006--  no recurrence   onocologist- dr odogwu--   . OSTEOARTHRITIS, MODERATE 04/07/2010  . Permanent atrial fibrillation (HCC)    On chronic anti-coagulation with warfarin  . Pneumonia     x 3 - last time 02/2018  . Retroperitoneal fibrosis   . Stroke [facility])    ? - [place]- "you have had 3 strokes"   Past Surgical History:  Procedure Laterality Date  . BREAST MASS EXCISION     BENIGN-- RIGHT BREAST  . CARDIOVASCULAR STRESS TEST  04-04-2009-- PER PT ASYMPTOMATIC-- MEDICAL MANAGEMENT   STUDY WAS EQUIVOCAL/ EF 49%/ GLOBAL HYPOKINESIS/ APPEARS TO BE A MILD ANTERIOR PERFUSION DEFECT COULD REPRESENT ISCHEMIA BUT DUE TO  ACTIVITY WORSE IN STRESS THAN AT REST POSS. THE DEFECT WAS ARTIFACTUAL  . CARDIOVERSION N/A 07/11/2015   Procedure: CARDIOVERSION;  Surgeon: Larey Dresser, MD;  Location: [place];  Service: Cardiovascular;  Laterality: N/A;  . CT ANGIOGRAM  FEB 2011   CALCIUM SCORE 161/ POSS. MODERATE MID LAD STENOSIS  . CYSTOSCOPY W/ RETROGRADES  04/28/2011   Procedure: CYSTOSCOPY WITH RETROGRADE PYELOGRAM;  Surgeon: Fredricka Bonine, MD;  Location: [facility];  Service: Urology;  Laterality: Left;  . CYSTOSCOPY W/ URETERAL STENT REMOVAL  04/28/2011  Procedure: CYSTOSCOPY WITH STENT REMOVAL;  Surgeon: Fredricka Bonine, MD;  Location[facility];  Service: Urology;;  . EXPLORATORY LAPAROTOMY  2005   LYMPHOMA  . EYE SURGERY Bilateral    cataract   . GAS INSERTION Right 03/31/2018   Procedure: RIGHT EYE INSERTION OF GAS C3F8;  Surgeon: Bernarda Caffey, MD;  Location: [place];  Service: Ophthalmology;  Laterality: Right;  . KNEE ARTHROSCOPY  2005   RIGHT  . LASER PHOTO ABLATION Right 03/31/2018   Procedure: RIGHT EYE LASER PHOTO ABLATION;  Surgeon: Bernarda Caffey, MD;  Location: [place];  Service: Ophthalmology;  Laterality: Right;  . MULTIPLE CYSTO/ LEFT URETERAL STENT EXCHANGES  LAST ONE 10-28-10  . RETROPERITONEAL BX  2007  . REVERSE SHOULDER ARTHROPLASTY Right 01/31/2015   Procedure: RIGHT REVERSE SHOULDER ARTHROPLASTY;  Surgeon: Justice Britain, MD;  Location: [place];  Service: Orthopedics;  Laterality: Right;  . TONSILLECTOMY  CHILD  . TOTAL  KNEE ARTHROPLASTY  OCT 2010   RIGHT  . TOTAL KNEE ARTHROPLASTY  JAN 2010   LEFT  . TRANSTHORACIC ECHOCARDIOGRAM  [phone]   MILD SYSTOLIC DYSFUNCTION/ EF 10-25%/ MODERATE DIASTOLIC DYSFUCTION/ MILD MR/ MILD BIATRIAL ENLARGEMENT  . VITRECTOMY 25 GAUGE WITH SCLERAL BUCKLE Right 03/31/2018   Procedure: RIGHT VITRECTOMY 25 GAUGE WITH SCLERAL BUCKLE;  Surgeon: Bernarda Caffey, MD;  Location: [place];  Service: Ophthalmology;  Laterality: Right;   Family History  Problem Relation Age of Onset  . Arthritis Mother   . Heart disease Mother   . Heart attack Mother   . Diabetes Sister   . Hypertension Sister   . Stroke Sister   . Heart attack Sister    Social History   Socioeconomic History  . Marital status: Married    Spouse name: Not on file  . Number of children: Not on file  . Years of education: Not on file  . Highest education level: Not on file  Occupational History  . Not on file  Tobacco Use  . Smoking status: Former Smoker    Packs/day: 2.00    Years: 52.00    Pack years: 104.00    Types: Cigarettes    Quit date: 03/24/2008    Years since quitting: 11.8  . Smokeless tobacco: Never Used  Vaping Use  . Vaping Use: Never used  Substance and Sexual Activity  . Alcohol use: Not Currently    Comment: RARE  . Drug use: No  . Sexual activity: Not on file  Other Topics Concern  . Not on file  Social History Narrative  . Not on file   Social Determinants of Health   Financial Resource Strain: Medium Risk  . Difficulty of Paying Living Expenses: Somewhat hard  Food Insecurity: No Food Insecurity  . Worried About Charity fundraiser in the Last Year: Never true  . Ran Out of Food in the Last Year: Never true  Transportation Needs: No Transportation Needs  . Lack of Transportation (Medical): No  . Lack of Transportation (Non-Medical): No  Physical Activity: Inactive  . Days of Exercise per Week: 0 days  . Minutes of Exercise per Session: 0 min  Stress: Stress Concern  Present  . Feeling of Stress : To some extent  Social Connections: Moderately Isolated  . Frequency of Communication with Friends and Family: More than three times a week  . Frequency of Social Gatherings with Friends and Family: More than three times a week  . Attends Religious Services: Never  . Active  Member of Clubs or Organizations: No  . Attends Archivist Meetings: Never  . Marital Status: Married    Tobacco Counseling Counseling given: Not Answered   Clinical Intake:  Pre-visit preparation completed: Yes  Pain : No/denies pain     Nutritional Risks: None Diabetes: No (Prediabetic)  How often do you need to have someone help you when you read instructions, pamphlets, or other written materials from your doctor or pharmacy?: 3 - Sometimes (due to detached retina) What is the last grade level you completed in school?: 2 years college  Diabetic? No   Interpreter Needed?: No  Information entered by :: [facility] Daily [facility] In your present state of health, do you have any difficulty performing the following activities: 01/25/2020  Hearing? N  Vision? Y  Comment Has detached retina  Difficulty concentrating or making decisions? N  Walking or climbing stairs? Y  Dressing or bathing? N  Doing errands, shopping? Y  Preparing Food and eating ? N  Using the Toilet? N  In the past six months, have you accidently leaked urine? Y  Do you have problems with loss of bowel control? N  Managing your Medications? N  Managing your Finances? N  Housekeeping or managing your Housekeeping? N  Some recent data might be hidden    Patient Care Team: [name], MD as PCP - General Sueanne Margarita, MD as PCP - Cardiology (Cardiology)  Indicate any recent Medical Services you may have received from other than [facility] providers in the past year (date may be approximate).     Assessment:   This is a routine wellness examination for  N[place].  Hearing/Vision screen  Hearing Screening   125Hz  250Hz  500Hz  1000Hz  2000Hz  3000Hz  4000Hz  6000Hz  8000Hz   Right ear:           Left ear:           Vision Screening Comments: Patient states gets checked annually. Has had issues with detached retinas  Dietary issues and exercise activities discussed: Current Exercise Habits: The patient does not participate in regular exercise at present  Goals    . Exercise 3x per week (30 min per time)    . Patient Stated     Will remain healthy Continue to be able to live independently       Depression Screen PHQ 2/9 Scores 01/25/2020 04/16/2017 05/31/2015 04/23/2014 11/30/2012  PHQ - 2 Score 5 0 0 0 1  PHQ- 9 Score 7 - - - -    Fall Risk Fall Risk  01/25/2020 05/31/2019 04/16/2017 10/14/2016 05/31/2015  Falls in the past year? 0 0 Yes Yes Yes  Comment - - up the steps and slipper felt  - -  Number falls in past yr: 0 - 1 2 or more 2 or more  Injury with Fall? 0 - - Yes -  Risk for fall due to : Impaired balance/gait - - - -  Follow up Falls evaluation completed;Falls prevention discussed - Education provided - -    Any stairs in or around the home? Yes  If so, are there any without handrails? No  Home free of loose throw rugs in walkways, pet beds, electrical cords, etc? Yes  Adequate lighting in your home to reduce risk of falls? Yes   ASSISTIVE DEVICES UTILIZED TO PREVENT FALLS:  Life alert? No  Use of a cane, walker or w/c? Yes  Grab bars in the bathroom? No  Shower chair or bench  in shower? No  Elevated toilet seat or a handicapped toilet? No    Cognitive Function:  Cognitive screening not indicated based on direct observation      Immunizations Immunization History  Administered Date(s) Administered  . Fluad Quad(high Dose 65+) 03/21/2019  . Influenza Split 01/08/2011, 11/30/2011, 04/23/2014  . Influenza Whole 04/07/2010  . Influenza, High Dose Seasonal PF 01/15/2017, 12/24/2017  . Influenza,inj,Quad PF,6+ Mos  11/30/2012, 04/23/2014  . Influenza-Unspecified 04/04/2015  . PFIZER SARS-COV-2 Vaccination 05/14/2019, 06/07/2019, 01/09/2020  . Pneumococcal Conjugate-13 05/31/2015  . Pneumococcal Polysaccharide-23 01/08/2011  . Td 07/25/2016  . Tdap 05/22/2012  . Zoster Recombinat (Shingrix) 09/29/2016    TDAP status: Up to date Flu Vaccine status: Declined, Education has been provided regarding the importance of this vaccine but patient still declined. Advised may receive this vaccine at local pharmacy or Health Dept. Aware to provide a copy of the vaccination record if obtained from local pharmacy or Health Dept. Verbalized acceptance and understanding. Pneumococcal vaccine status: Up to date Covid-19 vaccine status: Completed vaccines  Qualifies for Shingles Vaccine? Yes   Zostavax completed Yes   Shingrix Completed?: Yes  Screening Tests Health Maintenance  Topic Date Due  . COLONOSCOPY  11/03/2016  . INFLUENZA VACCINE  10/22/2019  . TETANUS/TDAP  07/26/2026  . DEXA SCAN  Completed  . COVID-19 Vaccine  Completed  . Hepatitis C Screening  Completed  . PNA vac Low Risk Adult  Completed    Health Maintenance  Health Maintenance Due  Topic Date Due  . COLONOSCOPY  11/03/2016  . INFLUENZA VACCINE  10/22/2019    Colorectal cancer screening: Referral to GI placed 01/25/2020. Pt aware the office will call re: appt. Mammogram status: Ordered 01/25/2020. Pt provided with contact info and advised to call to schedule appt.  Bone Density status: Ordered 01/25/2020. Pt provided with contact info and advised to call to schedule appt.  Lung Cancer Screening: (Low Dose CT Chest recommended if Age 6-80 years, 30 pack-year currently smoking OR have quit w/in 15years.) does qualify.   Lung Cancer Screening Referral: Yes  Additional Screening:  Hepatitis C Screening: does qualify; Completed 05/31/2015   Vision Screening: Recommended annual ophthalmology exams for early detection of glaucoma  and other disorders of the eye. Is the patient up to date with their annual eye exam?  Yes  Who is the provider or what is the name of the office in which the patient attends annual eye exams? Dr. Coralyn Pear, Dr. Herbert Deaner If pt is not established with a provider, would they like to be referred to a provider to establish care? No .   Dental Screening: Recommended annual dental exams for proper oral hygiene  Community Resource Referral / Chronic Care Management: CRR required this visit?  No   CCM required this visit?  Yes     Plan:     I have personally reviewed and noted the following in the patient's chart:   . Medical and social history . Use of alcohol, tobacco or illicit drugs  . Current medications and supplements . Functional ability and status . Nutritional status . Physical activity . Advanced directives . List of other physicians . Hospitalizations, surgeries, and ER visits in previous 12 months . Vitals . Screenings to include cognitive, depression, and falls . Referrals and appointments  In addition, I have reviewed and discussed with patient certain preventive protocols, quality metrics, and best practice recommendations. A written personalized care plan for preventive services as well as general preventive health recommendations  were provided to patient.     Ofilia Neas, LPN   15/07/2078   Nurse Notes: CCM referral placed as patient reported having issues paying for her Eliquis and Livalo. Also placed referral to social work as patient is having difficulties paying for her utilities

## 2020-01-25 NOTE — Patient Instructions (Signed)
[name] , Thank you for taking time to come for your Medicare Wellness Visit. I appreciate your ongoing commitment to your health goals. Please review the following plan we discussed and let me know if I can assist you in the future.   Screening recommendations/referrals: Colonoscopy: Currently due orders placed this visit  Mammogram: Currently due orders placed this visit  Bone Density: Currently due orders placed this visit  Recommended yearly ophthalmology/optometry visit for glaucoma screening and checkup Recommended yearly dental visit for hygiene and checkup  Vaccinations: Influenza vaccine: Currently due, you may receive at your office or at your local pharmacy  Pneumococcal vaccine: Completed series  Tdap vaccine: Up to date, next due 07/26/2026 Shingles vaccine: completed series     Advanced directives: Advance directive discussed with you today. Even though you declined this today please call our office should you change your mind and we can give you the proper paperwork for you to fill out.   Conditions/risks identified: None   Next appointment: None    Preventive Care 65 Years and Older, Female Preventive care refers to lifestyle choices and visits with your health care provider that can promote health and wellness. What does preventive care include?  A yearly physical exam. This is also called an annual well check.  Dental exams once or twice a year.  Routine eye exams. Ask your health care provider how often you should have your eyes checked.  Personal lifestyle choices, including:  Daily care of your teeth and gums.  Regular physical activity.  Eating a healthy diet.  Avoiding tobacco and drug use.  Limiting alcohol use.  Practicing safe sex.  Taking low-dose aspirin every day.  Taking vitamin and mineral supplements as recommended by your health care provider. What happens during an annual well check? The services and screenings done by your  health care provider during your annual well check will depend on your age, overall health, lifestyle risk factors, and family history of disease. Counseling  Your health care provider may ask you questions about your:  Alcohol use.  Tobacco use.  Drug use.  Emotional well-being.  Home and relationship well-being.  Sexual activity.  Eating habits.  History of falls.  Memory and ability to understand (cognition).  Work and work Statistician.  Reproductive health. Screening  You may have the following tests or measurements:  Height, weight, and BMI.  Blood pressure.  Lipid and cholesterol levels. These may be checked every 5 years, or more frequently if you are over 31 years old.  Skin check.  Lung cancer screening. You may have this screening every year starting at age 34 if you have a 30-pack-year history of smoking and currently smoke or have quit within the past 15 years.  Fecal occult blood test (FOBT) of the stool. You may have this test every year starting at age 74.  Flexible sigmoidoscopy or colonoscopy. You may have a sigmoidoscopy every 5 years or a colonoscopy every 10 years starting at age 34.  Hepatitis C blood test.  Hepatitis B blood test.  Sexually transmitted disease (STD) testing.  Diabetes screening. This is done by checking your blood sugar (glucose) after you have not eaten for a while (fasting). You may have this done every 1-3 years.  Bone density scan. This is done to screen for osteoporosis. You may have this done starting at age 91.  Mammogram. This may be done every 1-2 years. Talk to your health care provider about how often you should have regular  mammograms. Talk with your health care provider about your test results, treatment options, and if necessary, the need for more tests. Vaccines  Your health care provider may recommend certain vaccines, such as:  Influenza vaccine. This is recommended every year.  Tetanus, diphtheria, and  acellular pertussis (Tdap, Td) vaccine. You may need a Td booster every 10 years.  Zoster vaccine. You may need this after age 10.  Pneumococcal 13-valent conjugate (PCV13) vaccine. One dose is recommended after age 66.  Pneumococcal polysaccharide (PPSV23) vaccine. One dose is recommended after age 28. Talk to your health care provider about which screenings and vaccines you need and how often you need them. This information is not intended to replace advice given to you by your health care provider. Make sure you discuss any questions you have with your health care provider. Document Released: 04/05/2015 Document Revised: 11/27/2015 Document Reviewed: 01/08/2015 Elsevier Interactive Patient Education  2017 [place] Prevention in the Home Falls can cause injuries. They can happen to people of all ages. There are many things you can do to make your home safe and to help prevent falls. What can I do on the outside of my home?  Regularly fix the edges of walkways and driveways and fix any cracks.  Remove anything that might make you trip as you walk through a door, such as a raised step or threshold.  Trim any bushes or trees on the path to your home.  Use bright outdoor lighting.  Clear any walking paths of anything that might make someone trip, such as rocks or tools.  Regularly check to see if handrails are loose or broken. Make sure that both sides of any steps have handrails.  Any raised decks and porches should have guardrails on the edges.  Have any leaves, snow, or ice cleared regularly.  Use sand or salt on walking paths during winter.  Clean up any spills in your garage right away. This includes oil or grease spills. What can I do in the bathroom?  Use night lights.  Install grab bars by the toilet and in the tub and shower. Do not use towel bars as grab bars.  Use non-skid mats or decals in the tub or shower.  If you need to sit down in the shower, use  a plastic, non-slip stool.  Keep the floor dry. Clean up any water that spills on the floor as soon as it happens.  Remove soap buildup in the tub or shower regularly.  Attach bath mats securely with double-sided non-slip rug tape.  Do not have throw rugs and other things on the floor that can make you trip. What can I do in the bedroom?  Use night lights.  Make sure that you have a light by your bed that is easy to reach.  Do not use any sheets or blankets that are too big for your bed. They should not hang down onto the floor.  Have a firm chair that has side arms. You can use this for support while you get dressed.  Do not have throw rugs and other things on the floor that can make you trip. What can I do in the kitchen?  Clean up any spills right away.  Avoid walking on wet floors.  Keep items that you use a lot in easy-to-reach places.  If you need to reach something above you, use a strong step stool that has a grab bar.  Keep electrical cords out of the way.  Do not use floor polish or wax that makes floors slippery. If you must use wax, use non-skid floor wax.  Do not have throw rugs and other things on the floor that can make you trip. What can I do with my stairs?  Do not leave any items on the stairs.  Make sure that there are handrails on both sides of the stairs and use them. Fix handrails that are broken or loose. Make sure that handrails are as long as the stairways.  Check any carpeting to make sure that it is firmly attached to the stairs. Fix any carpet that is loose or worn.  Avoid having throw rugs at the top or bottom of the stairs. If you do have throw rugs, attach them to the floor with carpet tape.  Make sure that you have a light switch at the top of the stairs and the bottom of the stairs. If you do not have them, ask someone to add them for you. What else can I do to help prevent falls?  Wear shoes that:  Do not have high heels.  Have  rubber bottoms.  Are comfortable and fit you well.  Are closed at the toe. Do not wear sandals.  If you use a stepladder:  Make sure that it is fully opened. Do not climb a closed stepladder.  Make sure that both sides of the stepladder are locked into place.  Ask someone to hold it for you, if possible.  Clearly mark and make sure that you can see:  Any grab bars or handrails.  First and last steps.  Where the edge of each step is.  Use tools that help you move around (mobility aids) if they are needed. These include:  Canes.  Walkers.  Scooters.  Crutches.  Turn on the lights when you go into a dark area. Replace any light bulbs as soon as they burn out.  Set up your furniture so you have a clear path. Avoid moving your furniture around.  If any of your floors are uneven, fix them.  If there are any pets around you, be aware of where they are.  Review your medicines with your doctor. Some medicines can make you feel dizzy. This can increase your chance of falling. Ask your doctor what other things that you can do to help prevent falls. This information is not intended to replace advice given to you by your health care provider. Make sure you discuss any questions you have with your health care provider. Document Released: 01/03/2009 Document Revised: 08/15/2015 Document Reviewed: 04/13/2014 Elsevier Interactive Patient Education  2017 [employer].

## 2020-01-26 ENCOUNTER — Telehealth: Payer: Self-pay | Admitting: Family Medicine

## 2020-01-26 NOTE — Progress Notes (Signed)
  Chronic Care Management   Note  01/26/2020 Name: [name] MRN: [id number] DOB: [birth year]  [name] is a 74 y.o. year old female who is a primary care patient of Burchette, Alinda Sierras, MD. I reached out to [name] by phone today in response to a referral sent by [name]'s PCP, [name], MD.   [name] was given information about Chronic Care Management services today including:  1. CCM service includes personalized support from designated clinical staff supervised by her physician, including individualized plan of care and coordination with other care providers 2. 24/7 contact phone numbers for assistance for urgent and routine care needs. 3. Service will only be billed when office clinical staff spend 20 minutes or more in a month to coordinate care. 4. Only one practitioner may furnish and bill the service in a calendar month. 5. The patient may stop CCM services at any time (effective at the end of the month) by phone call to the office staff.   Patient agreed to services and verbal consent obtained.   Follow up plan:   Carley Perdue [employer] Scheduler

## 2020-01-30 ENCOUNTER — Encounter: Payer: Self-pay | Admitting: Podiatry

## 2020-01-30 ENCOUNTER — Other Ambulatory Visit: Payer: Self-pay

## 2020-01-30 ENCOUNTER — Ambulatory Visit: Payer: Medicare Other | Admitting: Podiatry

## 2020-01-30 ENCOUNTER — Telehealth: Payer: Self-pay | Admitting: Family Medicine

## 2020-01-30 DIAGNOSIS — M2042 Other hammer toe(s) (acquired), left foot: Secondary | ICD-10-CM

## 2020-01-30 DIAGNOSIS — L97521 Non-pressure chronic ulcer of other part of left foot limited to breakdown of skin: Secondary | ICD-10-CM

## 2020-01-30 DIAGNOSIS — L03032 Cellulitis of left toe: Secondary | ICD-10-CM

## 2020-01-30 DIAGNOSIS — L03031 Cellulitis of right toe: Secondary | ICD-10-CM | POA: Diagnosis not present

## 2020-01-30 DIAGNOSIS — M2041 Other hammer toe(s) (acquired), right foot: Secondary | ICD-10-CM

## 2020-01-30 NOTE — Progress Notes (Signed)
Presents today for follow-up of her second toe left foot.  States been red and swollen for the past couple of weeks she got doxycycline from her primary doctor she states that she has been wrapping it because it still sore.  Objective: Pulses are palpable left.  Hammertoe deformity flexible to some degree at the level of the PIPJ resulting in a distal clavus which has then resulted in a blood blister as the pressure moved around to the side of the toe.  I was able to resect the blood blister and the majority of the distal clavus.  This resulted in about a 2 mm x 2 mm small ulceration.  This does not appear to be clinically infected.  Assessment: Contracture deformities of the toes.  Resulting in a distal clavus and superficial ulceration ultimately.  Plan: Discussed etiology pathology and surgical therapies of having her dressings daily on a regular basis.  I will follow-up with her in a few weeks at which time we may consider a flexor tenotomy to toes 234 of the left foot.

## 2020-01-30 NOTE — Telephone Encounter (Signed)
done

## 2020-01-30 NOTE — Telephone Encounter (Signed)
On your desk in the red folder.

## 2020-01-30 NOTE — Telephone Encounter (Signed)
Pt son dropped off [employer] disability placard application.  Please call pt [phone] or son [phone] when complete & ready for pick up.  Placed in providers folder.

## 2020-01-31 NOTE — Telephone Encounter (Signed)
Do you have this?

## 2020-01-31 NOTE — Telephone Encounter (Signed)
Yes , he just gave this to me this morning, called the patient to pick up from.

## 2020-02-01 ENCOUNTER — Telehealth: Payer: Self-pay | Admitting: Podiatry

## 2020-02-01 NOTE — Telephone Encounter (Signed)
Pt called wanting to know if it were okay if she used the liquid skin band aids on her wound. Please advise.

## 2020-02-01 NOTE — Telephone Encounter (Signed)
Pt has been updated with the following information.  

## 2020-02-01 NOTE — Telephone Encounter (Signed)
A bandage would be better.

## 2020-02-08 DIAGNOSIS — N270 Small kidney, unilateral: Secondary | ICD-10-CM | POA: Diagnosis not present

## 2020-02-08 DIAGNOSIS — N281 Cyst of kidney, acquired: Secondary | ICD-10-CM | POA: Diagnosis not present

## 2020-02-08 DIAGNOSIS — R3915 Urgency of urination: Secondary | ICD-10-CM | POA: Diagnosis not present

## 2020-02-09 ENCOUNTER — Other Ambulatory Visit: Payer: Self-pay | Admitting: Family Medicine

## 2020-02-20 ENCOUNTER — Other Ambulatory Visit: Payer: Self-pay | Admitting: Family Medicine

## 2020-02-27 ENCOUNTER — Other Ambulatory Visit: Payer: Self-pay

## 2020-02-27 ENCOUNTER — Encounter: Payer: Self-pay | Admitting: Podiatry

## 2020-02-27 ENCOUNTER — Ambulatory Visit: Payer: Medicare Other | Admitting: Podiatry

## 2020-02-27 DIAGNOSIS — L97521 Non-pressure chronic ulcer of other part of left foot limited to breakdown of skin: Secondary | ICD-10-CM

## 2020-02-27 DIAGNOSIS — L97511 Non-pressure chronic ulcer of other part of right foot limited to breakdown of skin: Secondary | ICD-10-CM | POA: Diagnosis not present

## 2020-02-27 NOTE — Progress Notes (Signed)
She presents today for follow-up of her ulceration fourth toe right and second toe left.  She states that she seems to be doing better.  Objective: Vital signs stable alert oriented x3 ulcerations appear to be healing very nicely distal clavi once evaluated and debrided demonstrate no open wounds.  Assessment: Well-healing hammertoe deformities and distal clavi with ulcerations.  Plan: Follow-up with her in a couple weeks at which time we can perform flexor tenotomies.

## 2020-03-01 ENCOUNTER — Telehealth: Payer: Self-pay

## 2020-03-01 NOTE — Telephone Encounter (Signed)
Pt would like to discuss the procedure that she is schedule for. Please advise.

## 2020-03-04 ENCOUNTER — Telehealth: Payer: Self-pay | Admitting: Podiatry

## 2020-03-04 ENCOUNTER — Telehealth: Payer: Self-pay | Admitting: Family Medicine

## 2020-03-04 NOTE — Telephone Encounter (Signed)
Please call pt and schedule an appt for follow up.

## 2020-03-04 NOTE — Telephone Encounter (Signed)
Patient is scheduled for 12/20 at 1:15

## 2020-03-04 NOTE — Telephone Encounter (Signed)
Pt would like to know how much she will have to stay off her feet before going through with the procedure before the holidays. Please advise.

## 2020-03-04 NOTE — Telephone Encounter (Signed)
Patient is wanting to make sure that these prescriptions are called in at the next refill date just in case Dr. Elease Hashimoto is on vacation the time that she needs them refilled or in case Dr. Elease Hashimoto needs for her to make an appointment for her to get her Rx refilled.  ALPRAZolam (XANAX) 1 MG tablet  buPROPion (WELLBUTRIN XL) 300 MG 24 hr tablet  [id number] - [place], [place] - [address] Phone:  [phone]  Fax:  [phone]

## 2020-03-05 NOTE — Telephone Encounter (Signed)
I spoke to [place] and relayed the information from Dr. Milinda Pointer.

## 2020-03-05 NOTE — Telephone Encounter (Signed)
She will have a dressing on and will have to keep it dry and clean for a week until the stitches come out. She will be wearing a shoe as well.

## 2020-03-05 NOTE — Telephone Encounter (Signed)
We can discuss it the day she is scheduled for it.  If she then decides not have it done that will be ok or [name] you can call her and answer her questions. She and I have discussed it previously and obviously it may need to come from another direction.

## 2020-03-05 NOTE — Telephone Encounter (Signed)
Pt is schedule for 03/12/20 4pm.

## 2020-03-06 ENCOUNTER — Telehealth: Payer: Self-pay | Admitting: *Deleted

## 2020-03-06 ENCOUNTER — Other Ambulatory Visit: Payer: Self-pay | Admitting: Family Medicine

## 2020-03-06 DIAGNOSIS — E7800 Pure hypercholesterolemia, unspecified: Secondary | ICD-10-CM

## 2020-03-06 DIAGNOSIS — D649 Anemia, unspecified: Secondary | ICD-10-CM

## 2020-03-06 DIAGNOSIS — I10 Essential (primary) hypertension: Secondary | ICD-10-CM

## 2020-03-06 DIAGNOSIS — M159 Polyosteoarthritis, unspecified: Secondary | ICD-10-CM

## 2020-03-06 DIAGNOSIS — I4821 Permanent atrial fibrillation: Secondary | ICD-10-CM

## 2020-03-06 NOTE — Telephone Encounter (Signed)
-----   Message from Viona Gilmore, [facility] sent at 03/06/2020  8:38 AM EST ----- Regarding: CCM referral Hi again,  Can you please put in a CCM referral for another Dr. Elease Hashimoto patient, Ms. [name]?  Thank you, Maddie

## 2020-03-11 ENCOUNTER — Other Ambulatory Visit: Payer: Self-pay

## 2020-03-11 ENCOUNTER — Ambulatory Visit (INDEPENDENT_AMBULATORY_CARE_PROVIDER_SITE_OTHER): Payer: Medicare Other | Admitting: Family Medicine

## 2020-03-11 ENCOUNTER — Ambulatory Visit (INDEPENDENT_AMBULATORY_CARE_PROVIDER_SITE_OTHER): Payer: Medicare Other

## 2020-03-11 ENCOUNTER — Encounter: Payer: Self-pay | Admitting: Family Medicine

## 2020-03-11 ENCOUNTER — Other Ambulatory Visit: Payer: Self-pay | Admitting: Family Medicine

## 2020-03-11 VITALS — BP 118/78 | HR 80 | Ht 66.0 in | Wt 206.0 lb

## 2020-03-11 DIAGNOSIS — R319 Hematuria, unspecified: Secondary | ICD-10-CM | POA: Diagnosis not present

## 2020-03-11 DIAGNOSIS — R634 Abnormal weight loss: Secondary | ICD-10-CM

## 2020-03-11 LAB — POCT URINALYSIS DIPSTICK
Bilirubin, UA: NEGATIVE
Glucose, UA: NEGATIVE
Ketones, UA: NEGATIVE
Nitrite, UA: NEGATIVE
Protein, UA: NEGATIVE
Spec Grav, UA: 1.01 (ref 1.010–1.025)
Urobilinogen, UA: 0.2 E.U./dL
pH, UA: 6 (ref 5.0–8.0)

## 2020-03-11 MED ORDER — ALPRAZOLAM 1 MG PO TABS
1.0000 mg | ORAL_TABLET | Freq: Three times a day (TID) | ORAL | 2 refills | Status: DC | PRN
Start: 2020-03-11 — End: 2020-06-17

## 2020-03-11 MED ORDER — FUROSEMIDE 20 MG PO TABS
20.0000 mg | ORAL_TABLET | Freq: Every day | ORAL | 1 refills | Status: DC
Start: 2020-03-11 — End: 2020-08-20

## 2020-03-11 NOTE — Progress Notes (Signed)
Established Patient Office Visit  Subjective:  Patient ID: [name], female    DOB: Aug 05, 1945  Age: 74 y.o. MRN: [id number]  CC:  Chief Complaint  Patient presents with  . Hematuria  . Medication Management    HPI [name] presents for onset last weekend of painless hematuria.  She denies any burning with urination.  No fevers or chills.  No suprapubic pain.  She also relates about 20 pound unintentional weight loss over the past few months.  She states she is had some stress with financial difficulties and has sometimes billing only eating 1 meal per day but she states that is not due to the financial stress.  She is just simply not had much appetite.  Denies any abdominal pain.  No cough.  No dyspnea.  No headaches.  No adenopathy.  She has chronic problems including history of diastolic dysfunction, hypertension, osteoarthritis, chronic coagulation with Eliquis, history of recurrent depression, and remote history of non-Hodgkin's lymphoma.  She has had depression history but does not feel her depression accounts for her weight loss.  She quit smoking about 10 years ago.  She smoked from age 48 until age 28 about 2 packs/day.  Past Medical History:  Diagnosis Date  . ABDOMINAL PAIN RIGHT UPPER QUADRANT 11/08/2008  . ALLERGIC RHINITIS 08/01/2009  . Anxiety   . Complication of anesthesia 01/31/2015   ? doesn't remember anything after being taken to pre surgery  . Coronary artery calcification seen on CAT scan 05/30/2009   moderate risk coronary calcium score by chest CT  . DDD (degenerative disc disease), lumbar   . DEPRESSION 06/13/2008  . DYSLIPIDEMIA 02/27/2009  . DYSPNEA 02/27/2009  . Dysrhythmia    Afib  . Frequency of urination   . GERD (gastroesophageal reflux disease)    "years ago"  . Head injury 2016   did not loose consciousness  . History of Bell's palsy   . History of DVT of lower extremity 2006-- CHRONIC COUMADIN THERAPY  . History of hiatal  hernia   . History of Lyme disease   . 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 Hypertension   . NON-HODGKIN'S LYMPHOMA, HX OF dx  2005--  chemoradiation completed 2006--  no recurrence   onocologist- dr odogwu--   . OSTEOARTHRITIS, MODERATE 04/07/2010  . Permanent atrial fibrillation (HCC)    On chronic anti-coagulation with warfarin  . Pneumonia    x 3 - last time 02/2018  . Retroperitoneal fibrosis   . Stroke [facility])    ? - [place]- "you have had 3 strokes"    Past Surgical History:  Procedure Laterality Date  . BREAST MASS EXCISION     BENIGN-- RIGHT BREAST  . CARDIOVASCULAR STRESS TEST  04-04-2009-- PER PT ASYMPTOMATIC-- MEDICAL MANAGEMENT   STUDY WAS EQUIVOCAL/ EF 49%/ GLOBAL HYPOKINESIS/ APPEARS TO BE A MILD ANTERIOR PERFUSION DEFECT COULD REPRESENT ISCHEMIA BUT DUE TO  ACTIVITY WORSE IN STRESS THAN AT REST POSS. THE DEFECT WAS ARTIFACTUAL  . CARDIOVERSION N/A 07/11/2015   Procedure: CARDIOVERSION;  Surgeon: Larey Dresser, MD;  Location: [place];  Service: Cardiovascular;  Laterality: N/A;  . CT ANGIOGRAM  FEB 2011   CALCIUM SCORE 161/ POSS. MODERATE MID LAD STENOSIS  . CYSTOSCOPY W/ RETROGRADES  04/28/2011   Procedure: CYSTOSCOPY WITH RETROGRADE PYELOGRAM;  Surgeon: Fredricka Bonine, MD;  Location: [facility];  Service: Urology;  Laterality: Left;  . CYSTOSCOPY W/ URETERAL STENT REMOVAL  04/28/2011  Procedure: CYSTOSCOPY WITH STENT REMOVAL;  Surgeon: Fredricka Bonine, MD;  Location: [facility];  Service: Urology;;  . EXPLORATORY LAPAROTOMY  2005   LYMPHOMA  . EYE SURGERY Bilateral    cataract   . GAS INSERTION Right 03/31/2018   Procedure: RIGHT EYE INSERTION OF GAS C3F8;  Surgeon: Bernarda Caffey, MD;  Location: [place];  Service: Ophthalmology;  Laterality: Right;  . KNEE ARTHROSCOPY  2005   RIGHT  . LASER PHOTO ABLATION Right 03/31/2018   Procedure: RIGHT EYE LASER PHOTO ABLATION;  Surgeon: Bernarda Caffey, MD;  Location: [place];  Service: Ophthalmology;  Laterality: Right;  . MULTIPLE CYSTO/ LEFT URETERAL STENT EXCHANGES  LAST ONE 10-28-10  . RETROPERITONEAL BX  2007  . REVERSE SHOULDER ARTHROPLASTY Right 01/31/2015   Procedure: RIGHT REVERSE SHOULDER ARTHROPLASTY;  Surgeon: Justice Britain, MD;  Location: [place];  Service: Orthopedics;  Laterality: Right;  . TONSILLECTOMY  CHILD  . TOTAL KNEE ARTHROPLASTY  OCT 2010   RIGHT  . TOTAL KNEE ARTHROPLASTY  JAN 2010   LEFT  . TRANSTHORACIC ECHOCARDIOGRAM  [phone]   MILD SYSTOLIC DYSFUNCTION/ EF 47-82%/ MODERATE DIASTOLIC DYSFUCTION/ MILD MR/ MILD BIATRIAL ENLARGEMENT  . VITRECTOMY 25 GAUGE WITH SCLERAL BUCKLE Right 03/31/2018   Procedure: RIGHT VITRECTOMY 25 GAUGE WITH SCLERAL BUCKLE;  Surgeon: Bernarda Caffey, MD;  Location: [place];  Service: Ophthalmology;  Laterality: Right;    Family History  Problem Relation Age of Onset  . Arthritis Mother   . Heart disease Mother   . Heart attack Mother   . Diabetes Sister   . Hypertension Sister   . Stroke Sister   . Heart attack Sister     Social History   Socioeconomic History  . Marital status: Married    Spouse name: Not on file  . Number of children: Not on file  . Years of education: Not on file  . Highest education level: Not on file  Occupational History  . Not on file  Tobacco Use  . Smoking status: Former Smoker    Packs/day: 2.00    Years: 52.00    Pack years: 104.00    Types: Cigarettes    Quit date: 03/24/2008    Years since quitting: 11.9  . Smokeless tobacco: Never Used  Vaping Use  . Vaping Use: Never used  Substance and Sexual Activity  . Alcohol use: Not Currently    Comment: RARE  . Drug use: No  . Sexual activity: Not on file  Other Topics Concern  . Not on file  Social History Narrative  . Not on file   Social Determinants of Health   Financial Resource Strain: Medium Risk  . Difficulty of Paying Living Expenses: Somewhat hard  Food Insecurity: No Food  Insecurity  . Worried About Charity fundraiser in the Last Year: Never true  . Ran Out of Food in the Last Year: Never true  Transportation Needs: No Transportation Needs  . Lack of Transportation (Medical): No  . Lack of Transportation (Non-Medical): No  Physical Activity: Inactive  . Days of Exercise per Week: 0 days  . Minutes of Exercise per Session: 0 min  Stress: Stress Concern Present  . Feeling of Stress : To some extent  Social Connections: Moderately Isolated  . Frequency of Communication with Friends and Family: More than three times a week  . Frequency of Social Gatherings with Friends and Family: More than three times a week  . Attends Religious Services: Never  .  Active Member of Clubs or Organizations: No  . Attends Archivist Meetings: Never  . Marital Status: Married  Human resources officer Violence: Not At Risk  . Fear of Current or Ex-Partner: No  . Emotionally Abused: No  . Physically Abused: No  . Sexually Abused: No    Outpatient Medications Prior to Visit  Medication Sig Dispense Refill  . apixaban (ELIQUIS) 5 MG TABS tablet Take 1 tablet (5 mg total) by mouth 2 (two) times daily. 60 tablet 5  . b complex vitamins capsule Take 2 capsules by mouth daily.     Marland Kitchen buPROPion (WELLBUTRIN XL) 300 MG 24 hr tablet Take 1 tablet (300 mg total) by mouth daily. 90 tablet 0  . Cholecalciferol (VITAMIN D) 2000 UNITS CAPS Take 2,000 Int'l Units by mouth.    . ciclopirox (PENLAC) 8 % solution SMARTSIG:1 Milliliter(s) Topical Daily    . Coenzyme Q10-Fish Oil-Vit E (CO-Q 10 OMEGA-3 FISH OIL PO) Take 100 mg by mouth daily.     Marland Kitchen diltiazem (CARDIZEM CD) 120 MG 24 hr capsule Take 1 capsule (120 mg total) by mouth daily. 90 capsule 3  . fluticasone (FLONASE) 50 MCG/ACT nasal spray instill 2 sprays into each nostril once daily 16 g 6  . ketoconazole (NIZORAL) 2 % cream SMARTSIG:1 Application Topical 1 to 2 Times Daily    . loratadine (CLARITIN) 10 MG tablet Take 10 mg by mouth  daily.    Marland Kitchen losartan (COZAAR) 25 MG tablet TAKE 1 TABLET BY MOUTH DAILY. 90 tablet 3  . Lysine 500 MG TABS Take 500 mg by mouth daily.    . metoprolol succinate (TOPROL-XL) 25 MG 24 hr tablet Take 1 tablet (25 mg total) by mouth daily. 90 tablet 3  . Pitavastatin Calcium (LIVALO) 2 MG TABS Take 1 tablet (2 mg total) by mouth daily. 30 tablet 11  . solifenacin (VESICARE) 5 MG tablet Take 5 mg by mouth daily.    . valACYclovir (VALTREX) 1000 MG tablet TAKE 2 TABLETS AT ONSET OF COLD SORES AND REPEAT 2 IN 12 HOURS. 30 tablet 1  . ALPRAZolam (XANAX) 1 MG tablet TAKE 1 TABLET BY MOUTH THREE TIMES A DAY AS NEEDED 90 tablet 2  . doxycycline (VIBRA-TABS) 100 MG tablet Take 1 tablet (100 mg total) by mouth 2 (two) times daily. 20 tablet 0  . furosemide (LASIX) 20 MG tablet TAKE 1 TABLET (20 MG TOTAL) BY MOUTH DAILY. NEEDS APPOINTMENT FOR REFILLS. [phone] 15 tablet 0  . Acetaminophen-Codeine 300-30 MG tablet Take 1 tablet by mouth every 6 (six) hours as needed. (Patient not taking: Reported on 01/25/2020)    . Biotin 5 MG CAPS Take 1 capsule by mouth daily.  (Patient not taking: Reported on 01/25/2020)    . DULoxetine (CYMBALTA) 60 MG capsule TAKE 1 CAPSULE BY MOUTH EVERY DAY (Patient not taking: Reported on 01/25/2020) 90 capsule 0  . terbinafine (LAMISIL) 250 MG tablet Take 250 mg by mouth daily. (Patient not taking: Reported on 01/25/2020)     No facility-administered medications prior to visit.    Allergies  Allergen Reactions  . Chlorhexidine Base Itching    CHG WIPES  . Clindamycin/Lincomycin Other (See Comments)    Caused fire like sensation in her stomach   . Sulfa Antibiotics Other (See Comments)    Severe GI upset  . Penicillins Hives and Rash    DID THE REACTION INVOLVE: Swelling of the face/tongue/throat, SOB, or low BP? No Sudden or severe rash/hives, skin peeling, or the  inside of the mouth or nose? No Did it require medical treatment? No When did it last happen?90s If all above  answers are "NO", may proceed with cephalosporin use.     ROS Review of Systems  Constitutional: Positive for appetite change and unexpected weight change. Negative for chills and fever.  Respiratory: Negative for cough and shortness of breath.   Cardiovascular: Negative for chest pain.  Gastrointestinal: Negative for abdominal pain and blood in stool.  Genitourinary: Positive for hematuria. Negative for flank pain.  Musculoskeletal: Positive for arthralgias.  Neurological: Negative for headaches.  Hematological: Negative for adenopathy.      Objective:    Physical Exam Vitals reviewed.  Constitutional:      Appearance: She is obese.  HENT:     Mouth/Throat:     Pharynx: Oropharynx is clear.  Cardiovascular:     Rate and Rhythm: Normal rate and regular rhythm.  Pulmonary:     Effort: Pulmonary effort is normal.     Breath sounds: Normal breath sounds.  Abdominal:     Palpations: Abdomen is soft. There is no mass.     Tenderness: There is no abdominal tenderness.  Musculoskeletal:     Cervical back: Neck supple.     Right lower leg: No edema.     Left lower leg: No edema.  Lymphadenopathy:     Cervical: No cervical adenopathy.  Neurological:     Mental Status: She is alert.     BP 118/78   Pulse 80   Ht 5' 6"  (1.676 m)   Wt 206 lb (93.4 kg)   SpO2 97%   BMI 33.25 kg/m  Wt Readings from Last 3 Encounters:  03/11/20 206 lb (93.4 kg)  08/23/19 225 lb (102.1 kg)  08/10/19 227 lb 12.8 oz (103.3 kg)     Health Maintenance Due  Topic Date Due  . COLONOSCOPY  11/03/2016    There are no preventive care reminders to display for this patient.  Lab Results  Component Value Date   TSH 6.65 (H) 09/13/2015   Lab Results  Component Value Date   WBC 6.1 08/23/2019   HGB 12.4 08/23/2019   HCT 36.4 08/23/2019   MCV 90 08/23/2019   PLT 188 08/23/2019   Lab Results  Component Value Date   NA 143 08/23/2019   K 4.5 08/23/2019   CHLORIDE 105 05/07/2015   CO2  23 08/23/2019   GLUCOSE 100 (H) 08/23/2019   BUN 21 08/23/2019   CREATININE 1.18 (H) 08/23/2019   BILITOT 0.3 02/01/2018   ALKPHOS 80 02/01/2018   AST 12 02/01/2018   ALT 14 08/23/2019   PROT 6.4 02/01/2018   ALBUMIN 3.9 02/01/2018   CALCIUM 8.9 08/23/2019   ANIONGAP 13 04/06/2018   EGFR 42 (L) 05/07/2015   GFR 47.96 (L) 05/31/2015   Lab Results  Component Value Date   CHOL 123 08/23/2019   Lab Results  Component Value Date   HDL 51 08/23/2019   Lab Results  Component Value Date   LDLCALC 57 08/23/2019   Lab Results  Component Value Date   TRIG 76 08/23/2019   Lab Results  Component Value Date   CHOLHDL 2.4 08/23/2019   Lab Results  Component Value Date   HGBA1C 6.7 (A) 04/08/2018      Assessment & Plan:   Problem List Items Addressed This Visit   None   Visit Diagnoses    Hematuria, unspecified type    -  Primary   Relevant  Orders   POCT urinalysis dipstick (Completed)   Urine Culture   Unintended weight loss       Relevant Orders   CBC with Differential/Platelet   TSH   CMP   Sedimentation rate   DG Chest 2 View    -Urine dipstick reveals blood only trace leukocytes. -Urine culture sent.  Also send for urine microscopy. -If culture negative recommend urology referral for further evaluation -We also recommend further evaluation with CBC, comprehensive metabolic panel, TSH, sed rate, chest x-ray to further evaluate weight loss -Refilled her furosemide and also alprazolam  Meds ordered this encounter  Medications  . furosemide (LASIX) 20 MG tablet    Sig: Take 1 tablet (20 mg total) by mouth daily. Needs appointment for refills. [phone]    Dispense:  90 tablet    Refill:  1  . ALPRAZolam (XANAX) 1 MG tablet    Sig: Take 1 tablet (1 mg total) by mouth 3 (three) times daily as needed.    Dispense:  90 tablet    Refill:  2    May refill on or after Mar 22, 2020    Follow-up: No follow-ups on file.    Carolann Littler, MD

## 2020-03-11 NOTE — Patient Instructions (Signed)
Hematuria, Adult Hematuria is blood in the urine. Blood may be visible in the urine, or it may be identified with a test. This condition can be caused by infections of the bladder, urethra, kidney, or prostate. Other possible causes include:  Kidney stones.  Cancer of the urinary tract.  Too much calcium in the urine.  Conditions that are passed from parent to child (inherited conditions).  Exercise that requires a lot of energy. Infections can usually be treated with medicine, and a kidney stone usually will pass through your urine. If neither of these is the cause of your hematuria, more tests may be needed to identify the cause of your symptoms. It is very important to tell your health care provider about any blood in your urine, even if it is painless or the blood stops without treatment. Blood in the urine, when it happens and then stops and then happens again, can be a symptom of a very serious condition, including cancer. There is no pain in the initial stages of many urinary cancers. Follow these instructions at home: Medicines  Take over-the-counter and prescription medicines only as told by your health care provider.  If you were prescribed an antibiotic medicine, take it as told by your health care provider. Do not stop taking the antibiotic even if you start to feel better. Eating and drinking  Drink enough fluid to keep your urine clear or pale yellow. It is recommended that you drink 3-4 quarts (2.8-3.8 L) a day. If you have been diagnosed with an infection, it is recommended that you drink cranberry juice in addition to large amounts of water.  Avoid caffeine, tea, and carbonated beverages. These tend to irritate the bladder.  Avoid alcohol because it may irritate the prostate (men). General instructions  If you have been diagnosed with a kidney stone, follow your health care provider's instructions about straining your urine to catch the stone.  Empty your bladder  often. Avoid holding urine for long periods of time.  If you are female: ? After a bowel movement, wipe from front to back and use each piece of toilet paper only once. ? Empty your bladder before and after sex.  Pay attention to any changes in your symptoms. Tell your health care provider about any changes or any new symptoms.  It is your responsibility to get your test results. Ask your health care provider, or the department performing the test, when your results will be ready.  Keep all follow-up visits as told by your health care provider. This is important. Contact a health care provider if:  You develop back pain.  You have a fever.  You have nausea or vomiting.  Your symptoms do not improve after 3 days.  Your symptoms get worse. Get help right away if:  You develop severe vomiting and are unable take medicine without vomiting.  You develop severe pain in your back or abdomen even though you are taking medicine.  You pass a large amount of blood in your urine.  You pass blood clots in your urine.  You feel very weak or like you might faint.  You faint. Summary  Hematuria is blood in the urine. It has many possible causes.  It is very important that you tell your health care provider about any blood in your urine, even if it is painless or the blood stops without treatment.  Take over-the-counter and prescription medicines only as told by your health care provider.  Drink enough fluid to keep   your urine clear or pale yellow. This information is not intended to replace advice given to you by your health care provider. Make sure you discuss any questions you have with your health care provider. Document Revised: 08/03/2018 Document Reviewed: 04/11/2016 Elsevier Patient Education  2020 Elsevier Inc.  

## 2020-03-12 ENCOUNTER — Ambulatory Visit: Payer: Medicare Other | Admitting: Podiatry

## 2020-03-12 ENCOUNTER — Telehealth: Payer: Self-pay | Admitting: Family Medicine

## 2020-03-12 ENCOUNTER — Telehealth: Payer: Self-pay | Admitting: Pharmacist

## 2020-03-12 ENCOUNTER — Other Ambulatory Visit: Payer: Self-pay

## 2020-03-12 ENCOUNTER — Other Ambulatory Visit (INDEPENDENT_AMBULATORY_CARE_PROVIDER_SITE_OTHER): Payer: Medicare Other

## 2020-03-12 DIAGNOSIS — R634 Abnormal weight loss: Secondary | ICD-10-CM | POA: Diagnosis not present

## 2020-03-12 LAB — URINE CULTURE
MICRO NUMBER:: 11338228
Result:: NO GROWTH
SPECIMEN QUALITY:: ADEQUATE

## 2020-03-12 LAB — URINALYSIS, ROUTINE W REFLEX MICROSCOPIC
Bacteria, UA: NONE SEEN /HPF
Bilirubin Urine: NEGATIVE
Glucose, UA: NEGATIVE
Hyaline Cast: NONE SEEN /LPF
Ketones, ur: NEGATIVE
Leukocytes,Ua: NEGATIVE
Nitrite: NEGATIVE
Specific Gravity, Urine: 1.007 (ref 1.001–1.03)
Squamous Epithelial / HPF: NONE SEEN /HPF (ref <=5)
WBC, UA: NONE SEEN /HPF (ref 0–5)
pH: <=5 (ref 5.0–8.0)

## 2020-03-12 NOTE — Chronic Care Management (AMB) (Signed)
Chronic Care Management Pharmacy Assistant   Name: [name]  MRN: [id number] DOB: 06/23/1945  Reason for Encounter: Medication Review/Initial Questions for Pharmacist visit on 03-13-2020  Patient Questions: 1. Have you seen any other providers since your last visit?   . Podiatry 2. Any changes in your medications or health? no 3. Any side effects from any medications? No 4. Do you have any symptoms or problems not managed by your medications?  . Depression medication 5. Any concerns about your health right now? No 6. Has your provider asked that you check blood pressure, blood sugar, or follow a special diet at home? No 7. Do you get any type of exercise regularly?  No 8. Can you think of a goal you would like to reach for your health? No 9. Do you have any problems getting your medications?  . Sometimes she has issues  affording her medications 10. Is there anything that you would like to discuss during the appointment?   The patient was asked to please bring medications, blood pressure/ blood sugar log, and supplements to her appointment.   PCP : [name], MD  Allergies:   Allergies  Allergen Reactions  . Chlorhexidine Base Itching    CHG WIPES  . Clindamycin/Lincomycin Other (See Comments)    Caused fire like sensation in her stomach   . Sulfa Antibiotics Other (See Comments)    Severe GI upset  . Penicillins Hives and Rash    DID THE REACTION INVOLVE: Swelling of the face/tongue/throat, SOB, or low BP? No Sudden or severe rash/hives, skin peeling, or the inside of the mouth or nose? No Did it require medical treatment? No When did it last happen?90s If all above answers are "NO", may proceed with cephalosporin use.     Medications: Outpatient Encounter Medications as of 03/12/2020  Medication Sig  . ALPRAZolam (XANAX) 1 MG tablet Take 1 tablet (1 mg total) by mouth 3 (three) times daily as needed.  Marland Kitchen apixaban (ELIQUIS) 5 MG TABS tablet  Take 1 tablet (5 mg total) by mouth 2 (two) times daily.  Marland Kitchen b complex vitamins capsule Take 2 capsules by mouth daily.   Marland Kitchen buPROPion (WELLBUTRIN XL) 300 MG 24 hr tablet Take 1 tablet (300 mg total) by mouth daily.  . Cholecalciferol (VITAMIN D) 2000 UNITS CAPS Take 2,000 Int'l Units by mouth.  . ciclopirox (PENLAC) 8 % solution SMARTSIG:1 Milliliter(s) Topical Daily  . Coenzyme Q10-Fish Oil-Vit E (CO-Q 10 OMEGA-3 FISH OIL PO) Take 100 mg by mouth daily.   Marland Kitchen diltiazem (CARDIZEM CD) 120 MG 24 hr capsule Take 1 capsule (120 mg total) by mouth daily.  . fluticasone (FLONASE) 50 MCG/ACT nasal spray instill 2 sprays into each nostril once daily  . furosemide (LASIX) 20 MG tablet Take 1 tablet (20 mg total) by mouth daily. Needs appointment for refills. [phone]  . ketoconazole (NIZORAL) 2 % cream SMARTSIG:1 Application Topical 1 to 2 Times Daily  . loratadine (CLARITIN) 10 MG tablet Take 10 mg by mouth daily.  Marland Kitchen losartan (COZAAR) 25 MG tablet TAKE 1 TABLET BY MOUTH DAILY.  Marland Kitchen Lysine 500 MG TABS Take 500 mg by mouth daily.  . metoprolol succinate (TOPROL-XL) 25 MG 24 hr tablet Take 1 tablet (25 mg total) by mouth daily.  . Pitavastatin Calcium (LIVALO) 2 MG TABS Take 1 tablet (2 mg total) by mouth daily.  . solifenacin (VESICARE) 5 MG tablet Take 5 mg by mouth daily.  . valACYclovir (VALTREX) 1000 MG  tablet TAKE 2 TABLETS AT ONSET OF COLD SORES AND REPEAT 2 IN 12 HOURS.   No facility-administered encounter medications on file as of 03/12/2020.    Current Diagnosis: Patient Active Problem List   Diagnosis Date Noted  . Pain in left foot 07/03/2019  . Chronic low back pain 01/16/2017  . Encounter for therapeutic drug monitoring 12/11/2016  . Acute encephalopathy 02/03/2015  . Hematoma of right parietal scalp 02/03/2015  . Anticoagulated on Coumadin 02/03/2015  . Anemia 02/03/2015  . Status post reverse total shoulder replacement 01/31/2015  . Chronic diastolic CHF (congestive heart failure)  ([place]) 06/10/2014  . Anxiety state 04/23/2014  . Permanent atrial fibrillation ([place]) 11/13/2013  . Mitral stenosis 11/13/2013  . Obesity (BMI 30-39.9) 11/30/2012  . Essential hypertension 11/03/2012  . Cardiomyopathy ([place]) 09/16/2011  . DVT of lower extremity (deep venous thrombosis) ([place]) 01/22/2011  . Osteoarthritis 04/07/2010  . ALLERGIC RHINITIS 08/01/2009  . Coronary artery calcification seen on CAT scan 05/30/2009  . CARDIOVASCULAR FUNCTION STUDY, ABNORMAL 05/01/2009  . HLD (hyperlipidemia) 02/27/2009  . DYSPNEA 02/27/2009  . ABDOMINAL PAIN RIGHT UPPER QUADRANT 11/08/2008  . NHL (non-Hodgkin's lymphoma) ([place]) 11/08/2008  . Depression, major, in partial remission ([place]) 06/13/2008  . FATIGUE 05/30/2008    Goals Addressed   None     Follow-Up:  Pharmacist Review   Maia Breslow, [place] Assistant [phone]

## 2020-03-12 NOTE — Telephone Encounter (Signed)
Patient would like Dr. Elease Hashimoto to give her a call.   She was seen 03/11/2020 and states she thinks the pain that she is having is in her kidneys.

## 2020-03-13 ENCOUNTER — Ambulatory Visit: Payer: Medicare Other | Admitting: Pharmacist

## 2020-03-13 DIAGNOSIS — I4821 Permanent atrial fibrillation: Secondary | ICD-10-CM

## 2020-03-13 DIAGNOSIS — I10 Essential (primary) hypertension: Secondary | ICD-10-CM

## 2020-03-13 LAB — COMPREHENSIVE METABOLIC PANEL
AG Ratio: 1.4 (calc) (ref 1.0–2.5)
ALT: 20 U/L (ref 6–29)
AST: 18 U/L (ref 10–35)
Albumin: 3.6 g/dL (ref 3.6–5.1)
Alkaline phosphatase (APISO): 81 U/L (ref 37–153)
BUN/Creatinine Ratio: 13 (calc) (ref 6–22)
BUN: 14 mg/dL (ref 7–25)
CO2: 27 mmol/L (ref 20–32)
Calcium: 9.3 mg/dL (ref 8.6–10.4)
Chloride: 106 mmol/L (ref 98–110)
Creat: 1.1 mg/dL — ABNORMAL HIGH (ref 0.60–0.93)
Globulin: 2.5 g/dL (calc) (ref 1.9–3.7)
Glucose, Bld: 125 mg/dL — ABNORMAL HIGH (ref 65–99)
Potassium: 5 mmol/L (ref 3.5–5.3)
Sodium: 143 mmol/L (ref 135–146)
Total Bilirubin: 0.5 mg/dL (ref 0.2–1.2)
Total Protein: 6.1 g/dL (ref 6.1–8.1)

## 2020-03-13 LAB — CBC WITH DIFFERENTIAL/PLATELET
Absolute Monocytes: 365 cells/uL (ref 200–950)
Basophils Absolute: 20 cells/uL (ref 0–200)
Basophils Relative: 0.4 %
Eosinophils Absolute: 70 cells/uL (ref 15–500)
Eosinophils Relative: 1.4 %
HCT: 35 % (ref 35.0–45.0)
Hemoglobin: 11.7 g/dL (ref 11.7–15.5)
Lymphs Abs: 1420 cells/uL (ref 850–3900)
MCH: 30.9 pg (ref 27.0–33.0)
MCHC: 33.4 g/dL (ref 32.0–36.0)
MCV: 92.3 fL (ref 80.0–100.0)
MPV: 11 fL (ref 7.5–12.5)
Monocytes Relative: 7.3 %
Neutro Abs: 3125 cells/uL (ref 1500–7800)
Neutrophils Relative %: 62.5 %
Platelets: 157 10*3/uL (ref 140–400)
RBC: 3.79 10*6/uL — ABNORMAL LOW (ref 3.80–5.10)
RDW: 12.7 % (ref 11.0–15.0)
Total Lymphocyte: 28.4 %
WBC: 5 10*3/uL (ref 3.8–10.8)

## 2020-03-13 LAB — SEDIMENTATION RATE: Sed Rate: 34 mm/h — ABNORMAL HIGH (ref 0–30)

## 2020-03-13 LAB — TSH: TSH: 2.34 mIU/L (ref 0.40–4.50)

## 2020-03-13 NOTE — Chronic Care Management (AMB) (Signed)
Chronic Care Management Pharmacy  Name: [name]  MRN: [id number] DOB: 1945-09-03  Initial Planning Appointment: completed 03/12/20  Initial Questions: 1. Have you seen any other providers since your last visit? n/a 2. Any changes in your medicines or health? No   Chief Complaint/ HPI  [name],  74 y.o. , female presents for their Initial CCM visit with the clinical pharmacist via telephone due to COVID-19 Pandemic.  PCP : [name], MD  Their chronic conditions include: HTN, HLD, AFib, dilated cardiomyopathy, depression/anxiety, allergic rhinitis, overactive bladder  Office Visits: -03/11/20 Carolann Littler, MD: Patient presented with hematuria. Urine culture was negative. Glucose was elevated.  -01/25/20 Ofilia Neas, LPN: Patient presented for medicare annual wellness visit. Referral placed for CCM and gastroenterology.  -05/31/19 Carolann Littler, MD: Patient presented for foot pain. Prescribed cephalexin for cellulitis.  Consult Visit: -02/27/20 Tyson Dense, DPM (podiatry): Patient presented for foot ulcer follow up. Improvement noted.  -01/30/20 Max Milinda Pointer, DPM (podiatry): Patient presented for foot ulcer follow up.   -01/01/20 Acquanetta Sit, DPM (podiatry): Patient presented for toe nail debridement.  -08/10/19 Fransico Him, MD (cardiology): Patient presented for CAC, HLD, HTN, Afib, and CHF follow up. Decreased cardizem to 120 mg daily.   -07/19/19 Sydnee Levans (dermatology): Patient presented for follow up. Unable to access notes.   Medications: Outpatient Encounter Medications as of 03/13/2020  Medication Sig  . ALPRAZolam (XANAX) 1 MG tablet Take 1 tablet (1 mg total) by mouth 3 (three) times daily as needed.  Marland Kitchen b complex vitamins capsule Take 2 capsules by mouth daily.   . Cholecalciferol (VITAMIN D) 2000 UNITS CAPS Take 2,000 Int'l Units by mouth.  . Coenzyme Q10-Fish Oil-Vit E (CO-Q 10 OMEGA-3 FISH OIL PO) Take 100 mg by mouth  daily.   Marland Kitchen diltiazem (CARDIZEM CD) 120 MG 24 hr capsule Take 1 capsule (120 mg total) by mouth daily.  . fluticasone (FLONASE) 50 MCG/ACT nasal spray instill 2 sprays into each nostril once daily  . furosemide (LASIX) 20 MG tablet Take 1 tablet (20 mg total) by mouth daily. Needs appointment for refills. [phone]  . loratadine (CLARITIN) 10 MG tablet Take 10 mg by mouth daily.  Marland Kitchen losartan (COZAAR) 25 MG tablet TAKE 1 TABLET BY MOUTH DAILY.  . metoprolol succinate (TOPROL-XL) 25 MG 24 hr tablet Take 1 tablet (25 mg total) by mouth daily.  . Pitavastatin Calcium (LIVALO) 2 MG TABS Take 1 tablet (2 mg total) by mouth daily.  . [DISCONTINUED] apixaban (ELIQUIS) 5 MG TABS tablet Take 1 tablet (5 mg total) by mouth 2 (two) times daily.  . [DISCONTINUED] buPROPion (WELLBUTRIN XL) 300 MG 24 hr tablet Take 1 tablet (300 mg total) by mouth daily.  . [DISCONTINUED] valACYclovir (VALTREX) 1000 MG tablet TAKE 2 TABLETS AT ONSET OF COLD SORES AND REPEAT 2 IN 12 HOURS.  Marland Kitchen Lysine 500 MG TABS Take 500 mg by mouth daily.  . Multiple Vitamins-Minerals (PRESERVISION AREDS 2 PO) Take 1 capsule by mouth in the morning and at bedtime.  . solifenacin (VESICARE) 5 MG tablet Take 5 mg by mouth daily.  . [DISCONTINUED] ciclopirox (PENLAC) 8 % solution SMARTSIG:1 Milliliter(s) Topical Daily  . [DISCONTINUED] ketoconazole (NIZORAL) 2 % cream SMARTSIG:1 Application Topical 1 to 2 Times Daily   No facility-administered encounter medications on file as of 03/13/2020.   Patient and her husband are retired and they support their adult son completely. He lives with them and they pay all of his bills and he is currently  applying for disability. Patient's son has a lot of medical problems including hepatic encephalopathy.  Patient has some trouble afford Eliquis and last prescription was pretty expensive. Discussed patient assistance qualifications and patient and her husband make about $7000 per month and even with supporting  their son, she would not qualify.  Patient reports she has had both knees replaced.  Current Diagnosis/Assessment:  Goals Addressed            This Visit's Progress   . Pharmacy care plan       CARE PLAN ENTRY (see longitudinal plan of care for additional care plan information)  Current Barriers:  . Chronic Disease Management support, education, and care coordination needs related to Hypertension, Hyperlipidemia, Atrial Fibrillation, Depression, and Anxiety   Hypertension BP Readings from Last 3 Encounters:  03/11/20 118/78  08/23/19 107/62  08/10/19 130/72   . Pharmacist Clinical Goal(s): o Over the next 90 days, patient will work with PharmD and providers to maintain BP goal <140/90 . Current regimen:  . Losartan 25 mg 1 tablet daily . Diltiazem 120 mg 1 capsule daily . Metoprolol succinate 25 mg 1 tablet daily . Interventions: o Discussed DASH eating plan recommendations: . Emphasizes vegetables, fruits, and whole-grains . Includes fat-free or low-fat dairy products, fish, poultry, beans, nuts, and vegetable oils . Limits foods that are high in saturated fat. These foods include fatty meats, full-fat dairy products, and tropical oils such as coconut, palm kernel, and palm oils. . Limits sugar-sweetened beverages and sweets . Limiting sodium intake to < 1500 mg/day . Patient self care activities - Over the next 90 days, patient will: o Check BP weekly, document, and provide at future appointments o Ensure daily salt intake < 2300 mg/day  Hyperlipidemia Lab Results  Component Value Date/Time   LDLCALC 57 08/23/2019 01:53 PM   . Pharmacist Clinical Goal(s): o Over the next 90 days, patient will work with PharmD and providers to maintain LDL goal < 70 . Current regimen:  . Pitavastatin 2 mg 1 tablet daily . COQ10 & fish oil daily . Interventions: o Discussed individual goals for cholesterol levels . Patient self care activities - Over the next 90 days, patient  will: o Continue current medications  AFib Pulse Readings from Last 3 Encounters:  03/11/20 80  08/23/19 (!) 59  08/10/19 66   . Pharmacist Clinical Goal(s): o Over the next 90 days, patient will work with PharmD and providers to maintain HR < 110 beats per minute . Current regimen:  . Diltiazem 120 mg 1 capsule daily . Eliquis 5 mg 1 tablet twice daily . Metoprolol succinate 25 mg 1 tablet daily . Interventions: o Discussed monitoring for signs of bleeding such as unexplained and excessive bleeding from a cut or injury, easy or excessive bruising, blood in urine or stools, and nosebleeds without a known cause . Patient self care activities - Over the next 90 days, patient will: o Continue current medications  Depression/anxiety . Pharmacist Clinical Goal(s) o Over the next 90 days, patient will work with PharmD and providers to manage symptoms of depression and anxiety . Current regimen:  . Bupropion XL 300 mg 1 tablet daily . Alprazolam 1 mg 1 tablet three times daily as needed . Interventions: o Discussed limiting the use of alprazolam and long term risks of taking such as falls, fractures, and memory loss . Patient self care activities - Over the next 90 days, patient will: o Continue current medications  Medication management . Pharmacist Clinical  Goal(s): o Over the next 90 days, patient will work with PharmD and providers to maintain optimal medication adherence . [facility] . Interventions o Comprehensive medication review performed. o Continue current medication management strategy . Patient self care activities - Over the next 90 days, patient will: o Take medications as prescribed o Report any questions or concerns to PharmD and/or provider(s)  Initial goal documentation       Hypertension   BP goal is:  <130/80  Office blood pressures are  BP Readings from Last 3 Encounters:  03/11/20 118/78  08/23/19 107/62  08/10/19 130/72   Patient  checks BP at home never Patient home BP readings are ranging: n/a  Patient has failed these meds in the past: none Patient is currently controlled on the following medications:  . Losartan 25 mg 1 tablet daily . Diltiazem 120 mg 1 capsule daily . Metoprolol succinate 25 mg 1 tablet daily  We discussed diet and exercise extensively -DASH eating plan recommendations: . Emphasizes vegetables, fruits, and whole-grains . Includes fat-free or low-fat dairy products, fish, poultry, beans, nuts, and vegetable oils . Limits foods that are high in saturated fat. These foods include fatty meats, full-fat dairy products, and tropical oils such as coconut, palm kernel, and palm oils. . Limits sugar-sweetened beverages and sweets . Limiting sodium intake to < 1500 mg/day   Plan  Continue current medications     Hyperlipidemia/CAC    LDL goal < 70  Last lipids Lab Results  Component Value Date   CHOL 123 08/23/2019   HDL 51 08/23/2019   LDLCALC 57 08/23/2019   TRIG 76 08/23/2019   CHOLHDL 2.4 08/23/2019   Hepatic Function Latest Ref Rng & Units 03/12/2020 08/23/2019 02/01/2018  Total Protein 6.1 - 8.1 g/dL 6.1 - 6.4  Albumin 3.5 - 4.8 g/dL - - 3.9  AST 10 - 35 U/L 18 - 12  ALT 6 - 29 U/L _0 Alk Phosphatase 39 - 117 IU/L - - 80  Total Bilirubin 0.2 - 1.2 mg/dL 0.5 - 0.3  Bilirubin, Direct 0.00 - 0.40 mg/dL - - 0.10     The ASCVD Risk score (Texline., et al., 2013) failed to calculate for the following reasons:   The valid total cholesterol range is 130 to 320 mg/dL   Patient has failed these meds in past: Crestor and Lipitor (myalgias) Patient is currently controlled on the following medications:  . Pitavastatin 2 mg 1 tablet daily . COQ10 & fish oil daily  We discussed:  diet and exercise extensively  Plan  Continue current medications  AFIB   Patient is currently rate controlled. Office heart rates are  Pulse Readings from Last 3 Encounters:  03/11/20 80   08/23/19 (!) 59  08/10/19 66    CHA2DS2-VASc Score =   5 The patient's score is based upon: sex, age > 35, HTN, CHF    Patient has failed these meds in past: none Patient is currently controlled on the following medications:  . Diltiazem 120 mg 1 capsule daily . Eliquis 5 mg 1 tablet BID . Metoprolol succinate 25 mg 1 tablet daily  We discussed:  monitoring HR and monitoring for signs of bleeding such as unexplained and excessive bleeding from a cut or injury, easy or excessive bruising, blood in urine or stools, and nosebleeds without a known cause   Plan  Continue current medications   Congestive Heart Failure/Dilated cardiomyopathy   Last ejection fraction: 08/2019  Lab  Results  Component Value Date   CREATININE 1.10 (H) 03/12/2020   BUN 14 03/12/2020   GFR 47.96 (L) 05/31/2015   GFRNONAA 45 (L) 08/23/2019   GFRAA 52 (L) 08/23/2019   NA 143 03/12/2020   K 5.0 03/12/2020   CALCIUM 9.3 03/12/2020   CO2 27 03/12/2020    Patient has failed these meds in past: none Patient is currently controlled on the following medications:  . Furosemide 20 mg 1 tablet daily . Losartan 25 mg 1 tablet daily . Metoprolol succinate 25 mg 1 tablet daily  We discussed denies any shortness of breath  Plan  Continue current medications   Depression/anxiety   Patient has failed these meds in past: citaprolam  Patient is currently controlled on the following medications:  . Bupropion XL 300 mg 1 tablet daily . Alprazolam 1 mg 1 tablet three times daily as needed  We discussed:  Long term risks of taking benzodiazepines (falls, fractures, and memory loss); patient is taking alprazolam at least twice a day  Plan  Continue current medications    Allergic rhinitis   Patient has failed these meds in past: none Patient is currently controlled on the following medications:  . Loratadine 10 mg 1 tablet daily as needed . Flonase 50 mcg/act 2 sprays in each nostril daily as  needed  We discussed:  Avoiding allergy triggers  Plan  Continue current medications   Overactive bladder   Patient has failed these meds in past: none Patient is currently controlled on the following medications:  . Solifenacin 5 mg 1 tablet daily - not taking  We discussed:  patient is doing kegel exercises  Plan Managed by urologist.   Miscellaneous   Patient is currently on the following medications:  . Lysine 500 mg 1 tablet daily . Vitamin B complex 1 capsule daily . Valacyclovir 1000 mg 2 tablets at onset of cold sores and repeat 2 in 12 hours . Vitamin D 2000 units 1 capsule daily   Plan  Continue current medications  Vaccines   Reviewed and discussed patient's vaccination history.    Immunization History  Administered Date(s) Administered  . Fluad Quad(high Dose 65+) 03/21/2019, 01/31/2020  . Influenza Split 01/08/2011, 11/30/2011, 04/23/2014  . Influenza Whole 04/07/2010  . Influenza, High Dose Seasonal PF 01/15/2017, 12/24/2017  . Influenza,inj,Quad PF,6+ Mos 11/30/2012, 04/23/2014  . Influenza-Unspecified 04/04/2015  . PFIZER(Purple Top)SARS-COV-2 Vaccination 05/14/2019, 06/07/2019, 01/09/2020  . Pneumococcal Conjugate-13 05/31/2015  . Pneumococcal Polysaccharide-23 01/08/2011  . Td 07/25/2016  . Tdap 05/22/2012  . Zoster Recombinat (Shingrix) 07/16/2016, 09/29/2016    Plan  Patient is up to date on all immunizations.   Medication Management   Patient's preferred pharmacy is:  [place], [address] [place] [address] Phone: [phone] Fax: [phone]  [id number] [address]: [phone]: [phone]  We discussed: Current pharmacy is preferred with insurance plan and patient is satisfied with pharmacy services  Plan  Continue current medication  management strategy   Follow up: 3 month phone visit   MJeni Salles PharmD BRossiePharmacist [place] B[phone]

## 2020-03-18 ENCOUNTER — Other Ambulatory Visit: Payer: Self-pay | Admitting: *Deleted

## 2020-03-18 NOTE — Patient Outreach (Signed)
[facility]) Care Management  03/18/2020  [name] 1946/03/08 [id number]   Opened in error   Cala Bradford L. Noelle Penner, RN, BSN, CCM [facility] Telephonic Care Management Care Coordinator Office number [phone] Main [facility] number [phone] Fax number [phone]

## 2020-03-19 ENCOUNTER — Other Ambulatory Visit: Payer: Self-pay | Admitting: *Deleted

## 2020-03-19 NOTE — Patient Outreach (Signed)
03/06/20 MD ref Fran Lowes [facility]tn [facility] Care Management  03/19/2020  Shamonique Battiste Sep 07, 1945 [id number]   [facility] unsuccessful  Telephone Assessment/Screen for referral  Referral Date: 03/06/20  Referral Source: MD referral from Dr Caryl Never office [facility] at [place] (staff CMA Kern Reap) Referral Reason: dx Atrial fibrillation, Hypertension (HTN) Insurance  [place] [employer] [facility]) medicare   Outreach attempt # 1 unsuccessful To the home/mobile number   Outreach attempt to the home number  No answer. [facility] RN CM left HIPAA [facility] Portability and Accountability Act) compliant voicemail message along with CM's contact info.   Plan: [facility] RN CM scheduled this patient for another call attempt within 4-7 business days  Dakotah Orrego L. Noelle Penner, RN, BSN, CCM [facility] Telephonic Care Management Care Coordinator Office number [phone] Mobile number [phone]  Main [facility] number [phone] Fax number [phone]

## 2020-03-20 DIAGNOSIS — R310 Gross hematuria: Secondary | ICD-10-CM | POA: Diagnosis not present

## 2020-03-25 ENCOUNTER — Other Ambulatory Visit: Payer: Self-pay | Admitting: *Deleted

## 2020-03-25 ENCOUNTER — Other Ambulatory Visit: Payer: Self-pay

## 2020-03-25 NOTE — Patient Outreach (Signed)
[facility]) Care Management  03/25/2020  [name] Jul 24, 1945 [id number]   [facility]  Telephone Assessment/Screen for MD referral  Referral Date: 03/06/20  Referral Source: MD referral from Dr Caryl Never office [facility] at [place] (staff CMA Kern Reap) Referral Reason: dx Atrial fibrillation, Hypertension (HTN) Insurance  [place] [employer] [facility]) medicare     Outreach attempt to the home number [phone] successful  Patient is able to verify HIPAA [facility] Portability and Accountability Act) identifiers Reviewed and addressed the purpose of the follow up call with the patient  Consent: [facility] RN CM reviewed [facility] services with patient. Patient gave verbal consent for services.  Screening Pharmacy needs Confirms she spoke with Gaylord Shih  Her son brought Eliquis for her in last few days so the issue is resolved for January 2022  Has cost concern with Eliquis that may be unresolved for February 2022 Encouraged to outreach to pharmacy staff in 2 weeks prior to needing to refill Eliquis  Hypertension (HTN)  BP reported by pt to be good Denies medical hypertension symptoms at this time  Atrial fibrillation  States she does not know when she is having atrial fibrillation symptoms vs panic attacks Panic attacks she reports are related to stress related to social (Son and husband) and medical issues (cancer) She reports interventions for stress relief have been unsuccessful  She reports noted blood in her urine and is pending an appointment for a scan  She reports this gives her a "Sense of doom"  She has a past medical history (PMH) of cancer more  She reports stressful episodes noted more after her husband retired Loss of 20-25 lbs unintentional PMH depression and anxiety,she is on medication and takes them as ordered  She is finding herself feeling stress and reports she will not want an offered [facility] referral  at this time She wants to wait until she finds out what all her tests are  She supports her adult son and has tried to get him on disability  She reports their income supports 3 people  Discussed [place] [employer] [facility]) medical transportation benefit  She reports she is aware of the benefit She does not want to use on resources discussed or offered at this time until "I find out what is wrong with me". " I have so much I have to do"  Plans  Patient agrees to care plan and follow up within the next 14-21 business days Pt encouraged to return a call to [facility] RN CM prn  Goals Addressed              This Visit's Progress     Patient Stated   .  [facility]) Track and Manage Heart Rate and Rhythm-Atrial Fibrillation (pt-stated)   On track     Timeframe:  Long-Range Goal Priority:  High Start Date:        03/25/20                     Expected End Date:                       Follow Up Date 04/10/20   - check pulse (heart) rate once a day - keep all lab appointments - take medicine as prescribed    Notes:     .  [facility]) Track and Manage My Blood Pressure-Hypertension (pt-stated)   On track     Timeframe:  Short-Term Goal  Priority:  Medium Start Date:           03/25/20                  Expected End Date:      07/20/20                 Follow Up Date 04/10/20   - check blood pressure weekly - choose a place to take my blood pressure (home, clinic or office, retail store)       Notes:     .  [facility]) Track and Manage My Symptoms-Depression (pt-stated)   Not on track     Timeframe:  Long-Range Goal Priority:  Medium Start Date:                   03/25/20          Expected End Date:                09/19/20       Follow Up Date 03/31/20   - spend time or talk with others at least 2 to 3 times per week         Notes:          Ludmilla Mcgillis L. Noelle Penner, RN, BSN, CCM [facility] Telephonic Care Management Care Coordinator Office number [phone] Main [facility] number [phone] Fax number  [phone]

## 2020-03-29 DIAGNOSIS — K5730 Diverticulosis of large intestine without perforation or abscess without bleeding: Secondary | ICD-10-CM | POA: Diagnosis not present

## 2020-03-29 DIAGNOSIS — Z8571 Personal history of Hodgkin lymphoma: Secondary | ICD-10-CM | POA: Diagnosis not present

## 2020-03-29 DIAGNOSIS — R310 Gross hematuria: Secondary | ICD-10-CM | POA: Diagnosis not present

## 2020-03-29 DIAGNOSIS — N132 Hydronephrosis with renal and ureteral calculous obstruction: Secondary | ICD-10-CM | POA: Diagnosis not present

## 2020-04-03 ENCOUNTER — Ambulatory Visit: Payer: Medicare Other | Admitting: Podiatry

## 2020-04-03 ENCOUNTER — Other Ambulatory Visit: Payer: Self-pay

## 2020-04-03 ENCOUNTER — Encounter: Payer: Self-pay | Admitting: Podiatry

## 2020-04-03 DIAGNOSIS — D689 Coagulation defect, unspecified: Secondary | ICD-10-CM | POA: Diagnosis not present

## 2020-04-03 DIAGNOSIS — L84 Corns and callosities: Secondary | ICD-10-CM

## 2020-04-03 DIAGNOSIS — B351 Tinea unguium: Secondary | ICD-10-CM | POA: Diagnosis not present

## 2020-04-03 DIAGNOSIS — M79676 Pain in unspecified toe(s): Secondary | ICD-10-CM

## 2020-04-07 NOTE — Progress Notes (Signed)
Subjective: Patient presents today with h/o DVT with c/o corns distal tip of digits 2-4 b/l feet and  painful, discolored, thick toenails which interfere with daily activities. Pain is aggravated when wearing enclosed shoe gear. Pain is getting progressively worse and relieved with periodic professional debridement.  She has remote history of ulceration distal tip of left 2nd digit. She states she hopes it is healed. Denies any swelling, redness, or drainage.  Patient also states she is having some bladder issues and being worked up for that right now and wants to postpone the flexor tenotomies right now she and Dr. Milinda Pointer discussed  She is on blood thinner, Eliquis.  She also states she misplaced her wallet on today and hopes it is at home.  [name], MD is her PCP. Last visit was 03/11/2020.  Current Outpatient Medications on File Prior to Visit  Medication Sig Dispense Refill  . ALPRAZolam (XANAX) 1 MG tablet Take 1 tablet (1 mg total) by mouth 3 (three) times daily as needed. 90 tablet 2  . apixaban (ELIQUIS) 5 MG TABS tablet Take 1 tablet (5 mg total) by mouth 2 (two) times daily. 60 tablet 5  . b complex vitamins capsule Take 2 capsules by mouth daily.     Marland Kitchen buPROPion (WELLBUTRIN XL) 300 MG 24 hr tablet Take 1 tablet (300 mg total) by mouth daily. 90 tablet 0  . Cholecalciferol (VITAMIN D) 2000 UNITS CAPS Take 2,000 Int'l Units by mouth.    . Coenzyme Q10-Fish Oil-Vit E (CO-Q 10 OMEGA-3 FISH OIL PO) Take 100 mg by mouth daily.     Marland Kitchen diltiazem (CARDIZEM CD) 120 MG 24 hr capsule Take 1 capsule (120 mg total) by mouth daily. 90 capsule 3  . fluticasone (FLONASE) 50 MCG/ACT nasal spray instill 2 sprays into each nostril once daily 16 g 6  . furosemide (LASIX) 20 MG tablet Take 1 tablet (20 mg total) by mouth daily. Needs appointment for refills. [id number] 90 tablet 1  . loratadine (CLARITIN) 10 MG tablet Take 10 mg by mouth daily.    Marland Kitchen losartan (COZAAR) 25 MG tablet TAKE 1  TABLET BY MOUTH DAILY. 90 tablet 3  . Lysine 500 MG TABS Take 500 mg by mouth daily.    . metoprolol succinate (TOPROL-XL) 25 MG 24 hr tablet Take 1 tablet (25 mg total) by mouth daily. 90 tablet 3  . Multiple Vitamins-Minerals (PRESERVISION AREDS 2 PO) Take 1 capsule by mouth in the morning and at bedtime.    . Pitavastatin Calcium (LIVALO) 2 MG TABS Take 1 tablet (2 mg total) by mouth daily. 30 tablet 11  . solifenacin (VESICARE) 5 MG tablet Take 5 mg by mouth daily.    . valACYclovir (VALTREX) 1000 MG tablet TAKE 2 TABLETS AT ONSET OF COLD SORES AND REPEAT 2 IN 12 HOURS. 30 tablet 1   No current facility-administered medications on file prior to visit.     Allergies  Allergen Reactions  . Chlorhexidine Base Itching    CHG WIPES  . Clindamycin/Lincomycin Other (See Comments)    Caused fire like sensation in her stomach   . Sulfa Antibiotics Other (See Comments)    Severe GI upset  . Penicillins Hives and Rash    DID THE REACTION INVOLVE: Swelling of the face/tongue/throat, SOB, or low BP? No Sudden or severe rash/hives, skin peeling, or the inside of the mouth or nose? No Did it require medical treatment? No When did it last happen?90s If all above answers are "  NO", may proceed with cephalosporin use.      Objective: There were no vitals filed for this visit.  75 y.o. Caucasian female, obese, in NAD. AAO x 3.  Vascular Examination: Capillary refill time to digits immediate b/l. Palpable DP pulses b/l. Palpable PT pulses b/l. Pedal hair present b/l. Skin temperature gradient within normal limits b/l. Unilateral edema left LE.  Dermatological Examination: Pedal skin with normal turgor, texture and tone bilaterally. No interdigital macerations bilaterally. Toenails 1-5 b/l elongated, discolored, dystrophic, thickened, crumbly with subungual debris and tenderness to dorsal palpation. Digits 2-4 b/l  preulcerative lesion with visible subdermal hemorrhage. No edema, no erythema, no  drainage, no fluctuance. No underlying wounds noted postdebridement on today's visit.  Postdebridement photos:     Musculoskeletal Examination: Normal muscle strength 5/5 to all lower extremity muscle groups bilaterally. No pain crepitus or joint limitation noted with ROM b/l. Hammertoes noted to the 2-5 bilaterally.  Neurological Examination: Protective sensation intact 5/5 intact bilaterally with 10g monofilament b/l. Vibratory sensation intact b/l.  Assessment: Pain due to onychomycosis of toenail  Pre-ulcerative corn or callous  Clotting disorder (HCC)  Plan: -Examined patient. -Toenails 1-5 b/l were debrided in length and girth with sterile nail nippers and dremel without iatrogenic bleeding.  -Preulcerative corns digits 2-4 b/l pared without complication. Applied light dressing to left 2nd digit and she was instructed to apply antibiotic ointment and keep dressing on left 2nd digit daily. Dispensed toe crests for each foot for daily protection. Apply every morning, remove every evening. -Patient to continue soft, supportive shoe gear daily -Follow up 4 weeks and keep a closer eye on her since she is having other health issues and cannot have flexor tenotomies performed at this time. -Patient to report any pedal injuries to medical professional immediately.   Marzetta Board, DPM

## 2020-04-10 ENCOUNTER — Other Ambulatory Visit: Payer: Self-pay | Admitting: *Deleted

## 2020-04-10 NOTE — Patient Outreach (Signed)
[place] [facility]) Care Management  04/10/2020  Zenith Kercheval 31-Dec-1945 [id number]   [facility] Unsuccessful outreach  Referral Date:03/06/20 Referral Source:MD referral from Dr Elease Hashimoto office Velora Heckler at [place] (staff CMA [place] Hummer) Referral Reason:dxAtrial fibrillation,Hypertension (HTN) InsuranceUnited Health care [facility]) medicare  Outreach attempt to the home number  No answer. [facility] RN CM left HIPAA [facility] Portability and Accountability Act) compliant voicemail message along with CM's contact info.   Plan: [facility] RN CM scheduled this patient for another call attempt within 4-7 business days  Laird Runnion L. Lavina Hamman, RN, BSN, [place] Coordinator Office number [phone] Mobile number [phone]  Main [facility] number [id number] Fax number [phone]

## 2020-04-16 ENCOUNTER — Other Ambulatory Visit: Payer: Self-pay | Admitting: *Deleted

## 2020-04-16 ENCOUNTER — Other Ambulatory Visit: Payer: Self-pay | Admitting: Cardiology

## 2020-04-16 NOTE — Patient Outreach (Signed)
[place] [facility]) Care Management  04/16/2020  Fayelynn Distel 1946/03/08 [id number]   [facility] Unsuccessful outreach  Referral Date:03/06/20 Referral Source:MD referral from Dr Elease Hashimoto office [facility] Heckler at [place] (staff CMA [place]mer) Referral Reason:dxAtrial fibrillation,Hypertension (HTN) InsuranceUnited Health care [facility]) medicare   Last successful outreach on 03/25/20 She did not want to use resources discussed or offered at this time until "I find out what is wrong with me". " I have so much I have to do"  Outreach attempt to the home number  No answer. [facility] RN CM left HIPAA [facility] Portability and Accountability Act) compliant voicemail message along with CM's contact info.   Plan: [facility] RN CM scheduled this patient for another call attempt within 4-7 business days Unsuccessful outreaches to patient on 04/10/20 & 04/16/20 Sent unsuccessful outreach letter   Joelene Millin L. Lavina Hamman, RN, BSN, [place] Coordinator Office number [phone] Main [facility] number [phone] Fax number [phone]

## 2020-04-16 NOTE — Telephone Encounter (Signed)
Eliquis 5mg  refill request received. Patient is 75 years old, weight-93.4kg, Crea-1.10 on 03/12/2020, Diagnosis-AfibDVT, and last seen by Dr. Radford Pax on 08/10/19. Dose is appropriate based on dosing criteria. Will send in refill to requested pharmacy.

## 2020-04-23 ENCOUNTER — Other Ambulatory Visit: Payer: Self-pay | Admitting: Family Medicine

## 2020-04-23 DIAGNOSIS — F324 Major depressive disorder, single episode, in partial remission: Secondary | ICD-10-CM

## 2020-04-23 NOTE — Patient Instructions (Addendum)
Hi Marria,  I'm so sorry that somehow this wasn't sent sooner! It was lovely getting to meet you over the phone. Below is a summary of the topics we discussed. I also wanted to remind you to check your blood pressure consistently at home.  Let me know if you have any questions or need anything before our follow up!  Best, Maddie  Jeni Salles, PharmD [facility] Clinical Pharmacist [place] [facility] [place]   Visit Information  Goals Addressed            This Visit's Progress   . Pharmacy care plan       CARE PLAN ENTRY (see longitudinal plan of care for additional care plan information)  Current Barriers:  . Chronic Disease Management support, education, and care coordination needs related to Hypertension, Hyperlipidemia, Atrial Fibrillation, Depression, and Anxiety   Hypertension BP Readings from Last 3 Encounters:  03/11/20 118/78  08/23/19 107/62  08/10/19 130/72   . Pharmacist Clinical Goal(s): o Over the next 90 days, patient will work with PharmD and providers to maintain BP goal <140/90 . Current regimen:  . Losartan 25 mg 1 tablet daily . Diltiazem 120 mg 1 capsule daily . Metoprolol succinate 25 mg 1 tablet daily . Interventions: o Discussed DASH eating plan recommendations: . Emphasizes vegetables, fruits, and whole-grains . Includes fat-free or low-fat dairy products, fish, poultry, beans, nuts, and vegetable oils . Limits foods that are high in saturated fat. These foods include fatty meats, full-fat dairy products, and tropical oils such as coconut, palm kernel, and palm oils. . Limits sugar-sweetened beverages and sweets . Limiting sodium intake to < 1500 mg/day . Patient self care activities - Over the next 90 days, patient will: o Check BP weekly, document, and provide at future appointments o Ensure daily salt intake < 2300 mg/day  Hyperlipidemia Lab Results  Component Value Date/Time   LDLCALC 57 08/23/2019 01:53 PM    . Pharmacist Clinical Goal(s): o Over the next 90 days, patient will work with PharmD and providers to maintain LDL goal < 70 . Current regimen:  . Pitavastatin 2 mg 1 tablet daily . COQ10 & fish oil daily . Interventions: o Discussed individual goals for cholesterol levels . Patient self care activities - Over the next 90 days, patient will: o Continue current medications  AFib Pulse Readings from Last 3 Encounters:  03/11/20 80  08/23/19 (!) 59  08/10/19 66   . Pharmacist Clinical Goal(s): o Over the next 90 days, patient will work with PharmD and providers to maintain HR < 110 beats per minute . Current regimen:  . Diltiazem 120 mg 1 capsule daily . Eliquis 5 mg 1 tablet twice daily . Metoprolol succinate 25 mg 1 tablet daily . Interventions: o Discussed monitoring for signs of bleeding such as unexplained and excessive bleeding from a cut or injury, easy or excessive bruising, blood in urine or stools, and nosebleeds without a known cause . Patient self care activities - Over the next 90 days, patient will: o Continue current medications  Depression/anxiety . Pharmacist Clinical Goal(s) o Over the next 90 days, patient will work with PharmD and providers to manage symptoms of depression and anxiety . Current regimen:  . Bupropion XL 300 mg 1 tablet daily . Alprazolam 1 mg 1 tablet three times daily as needed . Interventions: o Discussed limiting the use of alprazolam and long term risks of taking such as falls, fractures, and memory loss . Patient self care activities - Over  the next 90 days, patient will: o Continue current medications  Medication management . Pharmacist Clinical Goal(s): o Over the next 90 days, patient will work with PharmD and providers to maintain optimal medication adherence . Current pharmacy: [facility] . Interventions o Comprehensive medication review performed. o Continue current medication management strategy . Patient self care activities -  Over the next 90 days, patient will: o Take medications as prescribed o Report any questions or concerns to PharmD and/or provider(s)  Initial goal documentation        Ms. Mcnulty was given information about Chronic Care Management services today including:  1. CCM service includes personalized support from designated clinical staff supervised by her physician, including individualized plan of care and coordination with other care providers 2. 24/7 contact phone numbers for assistance for urgent and routine care needs. 3. Standard insurance, coinsurance, copays and deductibles apply for chronic care management only during months in which we provide at least 20 minutes of these services. Most insurances cover these services at 100%, however patients may be responsible for any copay, coinsurance and/or deductible if applicable. This service may help you avoid the need for more expensive face-to-face services. 4. Only one practitioner may furnish and bill the service in a calendar month. 5. The patient may stop CCM services at any time (effective at the end of the month) by phone call to the office staff.  Patient agreed to services and verbal consent obtained.   The patient verbalized understanding of instructions, educational materials, and care plan provided today and agreed to receive a mailed copy of patient instructions, educational materials, and care plan.  Telephone follow up appointment with pharmacy team member scheduled for: 3 months  Viona Gilmore, [facility]

## 2020-04-25 ENCOUNTER — Other Ambulatory Visit: Payer: Self-pay | Admitting: *Deleted

## 2020-04-25 NOTE — Patient Outreach (Signed)
[place] [facility]) Care Management  04/25/2020  Rosmary Dionisio 1945-12-03 [id number]   [facility] Unsuccessful outreach  Referral Date:03/06/20 Referral Source:MD referral from Dr Elease Hashimoto office Velora Heckler at [place] (staff CMA [place] Hummer) Referral Reason:dxAtrial fibrillation,Hypertension (HTN) InsuranceUnited Health care [facility]) medicare   Last successful outreach on 03/25/20 She did not want to use resources discussed or offered at this time until"I find out what is wrong with me". " I have so much I have to do"  Outreach attempt to the home number  No answer. [facility] RN CM left HIPAA [facility] Portability and Accountability Act) compliant voicemail message along with CM's contact info.   Plan: [facility] RN CM scheduled this [facility] engaged patient for hold for case closure pending possible outreach from patient Unsuccessful outreaches to patient on 04/10/20, 04/16/20& 04/25/20 Sent unsuccessful outreach letter on 04/16/20   Joelene Millin L. Lavina Hamman, RN, BSN, [place] Coordinator Office number [phone] Main [facility] number [phone] Fax number [phone]

## 2020-05-02 ENCOUNTER — Other Ambulatory Visit: Payer: Self-pay

## 2020-05-03 ENCOUNTER — Encounter: Payer: Self-pay | Admitting: Family Medicine

## 2020-05-03 ENCOUNTER — Ambulatory Visit (INDEPENDENT_AMBULATORY_CARE_PROVIDER_SITE_OTHER): Payer: Medicare Other | Admitting: Family Medicine

## 2020-05-03 VITALS — BP 124/80 | HR 63 | Ht 66.0 in | Wt 195.0 lb

## 2020-05-03 DIAGNOSIS — Z1211 Encounter for screening for malignant neoplasm of colon: Secondary | ICD-10-CM | POA: Diagnosis not present

## 2020-05-03 DIAGNOSIS — R1032 Left lower quadrant pain: Secondary | ICD-10-CM | POA: Diagnosis not present

## 2020-05-03 NOTE — Patient Instructions (Signed)
We will set up repeat colonoscopy.

## 2020-05-03 NOTE — Progress Notes (Signed)
**Note Angela-Identified via Obfuscation** Established Patient Office Visit  Subjective:  Patient ID: [name], female    DOB: 1945/12/04  Age: 75 y.o. MRN: [id number]  CC:  Chief Complaint  Patient presents with   Abdominal Pain    HPI [name] presents for left lower abdominal pain.  She presented with gross hematuria episode back in December.  She does take Eliquis regularly.  She also has history of kidney stones.  Denies any recent flank pain.  She went to urology and had CT scan and states this came back with no acute abnormalities.  Do not have access to that scan.  She has been scheduled for cystoscopy.  No further episodes of gross hematuria.  Her pain is intermittent and is mostly left lower quadrant.  This is roughly parallel to her umbilicus region.  No history of known diverticulitis.  No recent fever.  No stool changes.  No recent constipation or diarrhea issues.  Her pain seems to be worse after eating.  She has had no nausea or vomiting.  She has had some recent weight loss with additional 11 pounds of weight loss since December but she has made some dietary changes.  Last colonoscopy 2008.  She has had difficulty previously with liquid preps with nausea  Past Medical History:  Diagnosis Date   ABDOMINAL PAIN RIGHT UPPER QUADRANT 11/08/2008   ALLERGIC RHINITIS 08/01/2009   Anxiety    Complication of anesthesia 01/31/2015   ? doesn't remember anything after being taken to pre surgery   Coronary artery calcification seen on CAT scan 05/30/2009   moderate risk coronary calcium score by chest CT   DDD (degenerative disc disease), lumbar    DEPRESSION 06/13/2008   DYSLIPIDEMIA 02/27/2009   DYSPNEA 02/27/2009   Dysrhythmia    Afib   Frequency of urination    GERD (gastroesophageal reflux disease)    "years ago"   Head injury 2016   did not loose consciousness   History of Bell's palsy    History of DVT of lower extremity 2006-- CHRONIC COUMADIN THERAPY   History of hiatal hernia     History of Lyme disease    Hydronephrosis, left chronic -- secondary to retroperitoneal fibrosis   Hypertension    NON-HODGKIN'S LYMPHOMA, HX OF dx  2005--  chemoradiation completed 2006--  no recurrence   onocologist- dr odogwu--    OSTEOARTHRITIS, MODERATE 04/07/2010   Permanent atrial fibrillation ([place])    On chronic anti-coagulation with warfarin   Pneumonia    x 3 - last time 02/2018   Retroperitoneal fibrosis    Stroke ([place])    ? - [place]- "you have had 3 strokes"    Past Surgical History:  Procedure Laterality Date   BREAST MASS EXCISION     BENIGN-- RIGHT BREAST   CARDIOVASCULAR STRESS TEST  04-04-2009-- PER PT ASYMPTOMATIC-- MEDICAL MANAGEMENT   STUDY WAS EQUIVOCAL/ EF 49%/ GLOBAL HYPOKINESIS/ APPEARS TO BE A MILD ANTERIOR PERFUSION DEFECT COULD REPRESENT ISCHEMIA BUT DUE TO  ACTIVITY WORSE IN STRESS THAN AT REST POSS. THE DEFECT WAS ARTIFACTUAL   CARDIOVERSION N/A 07/11/2015   Procedure: CARDIOVERSION;  Surgeon: Larey Dresser, MD;  Location: [facility];  Service: Cardiovascular;  Laterality: N/A;   CT ANGIOGRAM  FEB 2011   CALCIUM SCORE 161/ POSS. MODERATE MID LAD STENOSIS   CYSTOSCOPY W/ RETROGRADES  04/28/2011   Procedure: CYSTOSCOPY WITH RETROGRADE PYELOGRAM;  Surgeon: Fredricka Bonine, MD;  Location: [facility];  Service: Urology;  Laterality: Left;  CYSTOSCOPY W/ URETERAL STENT REMOVAL  04/28/2011   Procedure: CYSTOSCOPY WITH STENT REMOVAL;  Surgeon: Fredricka Bonine, MD;  Location: [facility];  Service: Urology;;   EXPLORATORY LAPAROTOMY  2005   LYMPHOMA   EYE SURGERY Bilateral    cataract    GAS INSERTION Right 03/31/2018   Procedure: RIGHT EYE INSERTION OF GAS C3F8;  Surgeon: Bernarda Caffey, MD;  Location: [facility];  Service: Ophthalmology;  Laterality: Right;   KNEE ARTHROSCOPY  2005   RIGHT   LASER PHOTO ABLATION Right 03/31/2018   Procedure: RIGHT EYE LASER PHOTO ABLATION;  Surgeon: Bernarda Caffey,  MD;  Location: [place];  Service: Ophthalmology;  Laterality: Right;   MULTIPLE CYSTO/ LEFT URETERAL STENT EXCHANGES  LAST ONE 10-28-10   RETROPERITONEAL BX  2007   REVERSE SHOULDER ARTHROPLASTY Right 01/31/2015   Procedure: RIGHT REVERSE SHOULDER ARTHROPLASTY;  Surgeon: Justice Britain, MD;  Location: [facility];  Service: Orthopedics;  Laterality: Right;   TONSILLECTOMY  CHILD   TOTAL KNEE ARTHROPLASTY  OCT 2010   RIGHT   TOTAL KNEE ARTHROPLASTY  JAN 2010   LEFT   TRANSTHORACIC ECHOCARDIOGRAM  [id number]   MILD SYSTOLIC DYSFUNCTION/ EF 56-81%/ MODERATE DIASTOLIC DYSFUCTION/ MILD MR/ MILD BIATRIAL ENLARGEMENT   VITRECTOMY 25 GAUGE WITH SCLERAL BUCKLE Right 03/31/2018   Procedure: RIGHT VITRECTOMY 25 GAUGE WITH SCLERAL BUCKLE;  Surgeon: Bernarda Caffey, MD;  Location: [facility];  Service: Ophthalmology;  Laterality: Right;    Family History  Problem Relation Age of Onset   Arthritis Mother    Heart disease Mother    Heart attack Mother    Diabetes Sister    Hypertension Sister    Stroke Sister    Heart attack Sister     Social History   Socioeconomic History   Marital status: Married    Spouse name: Not on file   Number of children: Not on file   Years of education: Not on file   Highest education level: Not on file  Occupational History   Not on file  Tobacco Use   Smoking status: Former Smoker    Packs/day: 2.00    Years: 52.00    Pack years: 104.00    Types: Cigarettes    Quit date: 03/24/2008    Years since quitting: 12.1   Smokeless tobacco: Never Used  Vaping Use   Vaping Use: Never used  Substance and Sexual Activity   Alcohol use: Not Currently    Comment: RARE   Drug use: No   Sexual activity: Not on file  Other Topics Concern   Not on file  Social History Narrative   Not on file   Social Determinants of Health   Financial Resource Strain: Medium Risk   Difficulty of Paying Living Expenses: Somewhat hard  Food Insecurity: No Food  Insecurity   Worried About Charity fundraiser in the Last Year: Never true   Ran Out of Food in the Last Year: Never true  Transportation Needs: No Transportation Needs   Lack of Transportation (Medical): No   Lack of Transportation (Non-Medical): No  Physical Activity: Inactive   Days of Exercise per Week: 0 days   Minutes of Exercise per Session: 0 min  Stress: Stress Concern Present   Feeling of Stress : To some extent  Social Connections: Moderately Isolated   Frequency of Communication with Friends and Family: More than three times a week   Frequency of Social Gatherings with Friends and Family: More than three times  a week   Attends Religious Services: Never   Active Member of Clubs or Organizations: No   Attends Archivist Meetings: Never   Marital Status: Married  Human resources officer Violence: Not At Risk   Fear of Current or Ex-Partner: No   Emotionally Abused: No   Physically Abused: No   Sexually Abused: No    Outpatient Medications Prior to Visit  Medication Sig Dispense Refill   ALPRAZolam (XANAX) 1 MG tablet Take 1 tablet (1 mg total) by mouth 3 (three) times daily as needed. 90 tablet 2   b complex vitamins capsule Take 2 capsules by mouth daily.      buPROPion (WELLBUTRIN XL) 300 MG 24 hr tablet TAKE 1 TABLET BY MOUTH EVERY DAY 90 tablet 3   Cholecalciferol (VITAMIN D) 2000 UNITS CAPS Take 2,000 Int'l Units by mouth.     Coenzyme Q10-Fish Oil-Vit E (CO-Q 10 OMEGA-3 FISH OIL PO) Take 100 mg by mouth daily.      diltiazem (CARDIZEM CD) 120 MG 24 hr capsule Take 1 capsule (120 mg total) by mouth daily. 90 capsule 3   ELIQUIS 5 MG TABS tablet TAKE 1 TABLET BY MOUTH TWICE A DAY 60 tablet 5   fluticasone (FLONASE) 50 MCG/ACT nasal spray instill 2 sprays into each nostril once daily 16 g 6   furosemide (LASIX) 20 MG tablet Take 1 tablet (20 mg total) by mouth daily. Needs appointment for refills. [phone] 90 tablet 1   loratadine  (CLARITIN) 10 MG tablet Take 10 mg by mouth daily.     losartan (COZAAR) 25 MG tablet TAKE 1 TABLET BY MOUTH DAILY. 90 tablet 3   Lysine 500 MG TABS Take 500 mg by mouth daily.     metoprolol succinate (TOPROL-XL) 25 MG 24 hr tablet Take 1 tablet (25 mg total) by mouth daily. 90 tablet 3   Multiple Vitamins-Minerals (PRESERVISION AREDS 2 PO) Take 1 capsule by mouth in the morning and at bedtime.     Pitavastatin Calcium (LIVALO) 2 MG TABS Take 1 tablet (2 mg total) by mouth daily. 30 tablet 11   solifenacin (VESICARE) 5 MG tablet Take 5 mg by mouth daily.     valACYclovir (VALTREX) 1000 MG tablet TAKE 2 TABLETS AT ONSET OF COLD SORES AND REPEAT 2 IN 12 HOURS. 30 tablet 1   terbinafine (LAMISIL) 250 MG tablet      No facility-administered medications prior to visit.    Allergies  Allergen Reactions   Chlorhexidine Base Itching    CHG WIPES   Clindamycin/Lincomycin Other (See Comments)    Caused fire like sensation in her stomach    Sulfa Antibiotics Other (See Comments)    Severe GI upset   Penicillins Hives and Rash    DID THE REACTION INVOLVE: Swelling of the face/tongue/throat, SOB, or low BP? No Sudden or severe rash/hives, skin peeling, or the inside of the mouth or nose? No Did it require medical treatment? No When did it last happen?90s If all above answers are NO, may proceed with cephalosporin use.     ROS Review of Systems  Constitutional: Negative for chills and fever.  Respiratory: Negative for cough.   Cardiovascular: Negative for chest pain.  Gastrointestinal: Positive for abdominal pain. Negative for abdominal distention, blood in stool, constipation, diarrhea, nausea and vomiting.  Genitourinary: Negative for dysuria and flank pain.      Objective:    Physical Exam Vitals reviewed.  Constitutional:      Appearance: She is  well-developed.  Cardiovascular:     Rate and Rhythm: Normal rate.  Pulmonary:     Effort: Pulmonary effort is  normal.     Breath sounds: Normal breath sounds.  Abdominal:     General: Abdomen is flat. Bowel sounds are normal. There is no distension.     Palpations: Abdomen is soft. There is no hepatomegaly, splenomegaly or mass.     Tenderness: There is no abdominal tenderness.     Hernia: No hernia is present.  Neurological:     Mental Status: She is alert.     BP 124/80    Pulse 63    Ht _0  (1.676 m)    Wt 195 lb (88.5 kg)    SpO2 92%    BMI 31.47 kg/m  Wt Readings from Last 3 Encounters:  05/03/20 195 lb (88.5 kg)  03/11/20 206 lb (93.4 kg)  08/23/19 225 lb (102.1 kg)     There are no preventive care reminders to display for this patient.  There are no preventive care reminders to display for this patient.  Lab Results  Component Value Date   TSH 2.34 03/12/2020   Lab Results  Component Value Date   WBC 5.0 03/12/2020   HGB 11.7 03/12/2020   HCT 35.0 03/12/2020   MCV 92.3 03/12/2020   PLT 157 03/12/2020   Lab Results  Component Value Date   NA 143 03/12/2020   K 5.0 03/12/2020   CHLORIDE 105 05/07/2015   CO2 27 03/12/2020   GLUCOSE 125 (H) 03/12/2020   BUN 14 03/12/2020   CREATININE 1.10 (H) 03/12/2020   BILITOT 0.5 03/12/2020   ALKPHOS 80 02/01/2018   AST 18 03/12/2020   ALT 20 03/12/2020   PROT 6.1 03/12/2020   ALBUMIN 3.9 02/01/2018   CALCIUM 9.3 03/12/2020   ANIONGAP 13 04/06/2018   EGFR 42 (L) 05/07/2015   GFR 47.96 (L) 05/31/2015   Lab Results  Component Value Date   CHOL 123 08/23/2019   Lab Results  Component Value Date   HDL 51 08/23/2019   Lab Results  Component Value Date   LDLCALC 57 08/23/2019   Lab Results  Component Value Date   TRIG 76 08/23/2019   Lab Results  Component Value Date   CHOLHDL 2.4 08/23/2019   Lab Results  Component Value Date   HGBA1C 6.7 (A) 04/08/2018      Assessment & Plan:   Patient presents with several week history of vague left-sided abdominal pain.  Recent CT abdomen pelvis per urology  reportedly normal.  Her last colonoscopy was 2008.  Pending cystoscopy for work-up of her recent gross hematuria.  Recent lab work in late December showed no anemia but hemoglobin was lower end of normal at 11.7.  -Set up referral back to GI.  She is overdue for repeat colonoscopy.  Did not apparently have any evidence for diverticulitis by recent CT scan.  We need to get records of that.  -She knows to follow-up promptly for any vomiting, fever, worsening pain.  No orders of the defined types were placed in this encounter.   Follow-up: No follow-ups on file.    Carolann Littler, MD

## 2020-05-06 ENCOUNTER — Other Ambulatory Visit: Payer: Self-pay

## 2020-05-06 ENCOUNTER — Encounter: Payer: Self-pay | Admitting: Podiatry

## 2020-05-06 ENCOUNTER — Ambulatory Visit: Payer: Medicare Other | Admitting: Podiatry

## 2020-05-06 DIAGNOSIS — D689 Coagulation defect, unspecified: Secondary | ICD-10-CM | POA: Diagnosis not present

## 2020-05-06 DIAGNOSIS — M2041 Other hammer toe(s) (acquired), right foot: Secondary | ICD-10-CM | POA: Diagnosis not present

## 2020-05-06 DIAGNOSIS — M2042 Other hammer toe(s) (acquired), left foot: Secondary | ICD-10-CM

## 2020-05-09 ENCOUNTER — Other Ambulatory Visit: Payer: Self-pay | Admitting: *Deleted

## 2020-05-09 NOTE — Patient Outreach (Signed)
[facility]) Care Management  05/09/2020  Cheral Cappucci Aug 20, 1945 [id number]   [facility] fourth Unsuccessful outreach- [facility] Case closure  Referral Date:03/06/20 Referral Source:MD referral from Dr Elease Hashimoto office Velora Heckler at [place] (staff CMA Westley Hummer) Referral Reason:dxAtrial fibrillation,Hypertension (HTN) Insurance[facility] Health care [facility]) medicare   Last successful outreach on 03/25/20 She didnot want to use resources discussed or offered at this time until"I find out what is wrong with me". " I have so much I have to do"  Outreach attempt to the home number [phone] Female answer.He reports she is "still sleeping" and he will give her a message [facility] RN CM left HIPAA ([place] and Accountability Act) compliant voicemail message along with CM's contact info.   Plan: [facility] RN CM completed case closure Unsuccessful outreaches to patient on 04/10/20, 04/16/20, 04/25/20 05/09/20 Sent unsuccessful outreach letter on 04/16/20 without a return call or response from patient as 05/09/20 Letters to pt and referring MD Note to referring MD   Joelene Millin L. Lavina Hamman, RN, BSN, [place] Coordinator Office number [phone] [address] [facility] number [phone] Fax number [phone]

## 2020-05-10 DIAGNOSIS — R310 Gross hematuria: Secondary | ICD-10-CM | POA: Diagnosis not present

## 2020-05-10 DIAGNOSIS — N261 Atrophy of kidney (terminal): Secondary | ICD-10-CM | POA: Diagnosis not present

## 2020-05-12 NOTE — Progress Notes (Signed)
Subjective: Patient presents today with h/o DVT with c/o corns distal tip of digits 2-4 b/l feet and  painful, discolored, thick toenails which interfere with daily activities. Pain is aggravated when wearing enclosed shoe gear. Pain is getting progressively worse and relieved with periodic professional debridement.  She has remote history of ulceration distal tip of left 2nd digit. She states she hopes it is healed. Denies any swelling, redness, or drainage.  Patient states she has lost 31 pounds, unintentionally. She is having a bladder procedure on Friday. She is on blood thinner, Eliquis.  [name], MD is her PCP. Last visit was 03/11/2020.  Current Outpatient Medications on File Prior to Visit  Medication Sig Dispense Refill  . ALPRAZolam (XANAX) 1 MG tablet Take 1 tablet (1 mg total) by mouth 3 (three) times daily as needed. 90 tablet 2  . b complex vitamins capsule Take 2 capsules by mouth daily.     Marland Kitchen buPROPion (WELLBUTRIN XL) 300 MG 24 hr tablet TAKE 1 TABLET BY MOUTH EVERY DAY 90 tablet 3  . Cholecalciferol (VITAMIN D) 2000 UNITS CAPS Take 2,000 Int'l Units by mouth.    . Coenzyme Q10-Fish Oil-Vit E (CO-Q 10 OMEGA-3 FISH OIL PO) Take 100 mg by mouth daily.     Marland Kitchen diltiazem (CARDIZEM CD) 120 MG 24 hr capsule Take 1 capsule (120 mg total) by mouth daily. 90 capsule 3  . ELIQUIS 5 MG TABS tablet TAKE 1 TABLET BY MOUTH TWICE A DAY 60 tablet 5  . fluticasone (FLONASE) 50 MCG/ACT nasal spray instill 2 sprays into each nostril once daily 16 g 6  . furosemide (LASIX) 20 MG tablet Take 1 tablet (20 mg total) by mouth daily. Needs appointment for refills. [phone] 90 tablet 1  . loratadine (CLARITIN) 10 MG tablet Take 10 mg by mouth daily.    Marland Kitchen losartan (COZAAR) 25 MG tablet TAKE 1 TABLET BY MOUTH DAILY. 90 tablet 3  . Lysine 500 MG TABS Take 500 mg by mouth daily.    . metoprolol succinate (TOPROL-XL) 25 MG 24 hr tablet Take 1 tablet (25 mg total) by mouth daily. 90 tablet 3  .  Multiple Vitamins-Minerals (PRESERVISION AREDS 2 PO) Take 1 capsule by mouth in the morning and at bedtime.    . Pitavastatin Calcium (LIVALO) 2 MG TABS Take 1 tablet (2 mg total) by mouth daily. 30 tablet 11  . solifenacin (VESICARE) 5 MG tablet Take 5 mg by mouth daily.    Marland Kitchen terbinafine (LAMISIL) 250 MG tablet     . valACYclovir (VALTREX) 1000 MG tablet TAKE 2 TABLETS AT ONSET OF COLD SORES AND REPEAT 2 IN 12 HOURS. 30 tablet 1   No current facility-administered medications on file prior to visit.     Allergies  Allergen Reactions  . Chlorhexidine Base Itching    CHG WIPES  . Clindamycin/Lincomycin Other (See Comments)    Caused fire like sensation in her stomach   . Sulfa Antibiotics Other (See Comments)    Severe GI upset  . Penicillins Hives and Rash    DID THE REACTION INVOLVE: Swelling of the face/tongue/throat, SOB, or low BP? No Sudden or severe rash/hives, skin peeling, or the inside of the mouth or nose? No Did it require medical treatment? No When did it last happen?90s If all above answers are "NO", may proceed with cephalosporin use.      Objective: There were no vitals filed for this visit.  75 y.o. Caucasian female, obese, in NAD. AAO  x 3.  Vascular Examination: Capillary refill time to digits immediate b/l. Palpable DP pulses b/l. Palpable PT pulses b/l. Pedal hair present b/l. Skin temperature gradient within normal limits b/l. Unilateral edema left LE.  Dermatological Examination: Pedal skin with normal turgor, texture and tone bilaterally. No interdigital macerations bilaterally. Toenails 1-5 b/l elongated, discolored, dystrophic, thickened, crumbly with subungual debris and tenderness to dorsal palpation. Digits 2-4 b/l  lesion with visible subdermal hemorrhage improved. No edema, no erythema, no drainage, no fluctuance. No underlying wounds noted postdebridement on today's visit.  Musculoskeletal Examination: Normal muscle strength 5/5 to all lower  extremity muscle groups bilaterally. No pain crepitus or joint limitation noted with ROM b/l. Hammertoes noted to the 2-5 bilaterally.  Neurological Examination: Protective sensation intact 5/5 intact bilaterally with 10g monofilament b/l. Vibratory sensation intact b/l.  Assessment: Hammer toes of both feet  Clotting disorder (HCC)  Plan: -Examined patient. -Toenails 1-5 b/l were debrided in length and girth with sterile nail nippers and dremel without iatrogenic bleeding.  -Continue toe crests for each foot for daily protection. Apply every morning, remove every evening. -Patient to continue soft, supportive shoe gear daily -Follow up 4 weeks and keep a closer eye on her since she is having other health issues and cannot have flexor tenotomies performed at this time due to work up of other health issues. -Patient to report any pedal injuries to medical professional immediately.   Marzetta Board, DPM

## 2020-05-16 ENCOUNTER — Other Ambulatory Visit: Payer: Self-pay

## 2020-05-17 ENCOUNTER — Encounter: Payer: Self-pay | Admitting: Family Medicine

## 2020-05-17 ENCOUNTER — Encounter: Payer: Self-pay | Admitting: Gastroenterology

## 2020-05-17 ENCOUNTER — Ambulatory Visit (INDEPENDENT_AMBULATORY_CARE_PROVIDER_SITE_OTHER): Payer: Medicare Other | Admitting: Family Medicine

## 2020-05-17 VITALS — BP 120/80 | HR 47 | Ht 66.0 in | Wt 192.0 lb

## 2020-05-17 DIAGNOSIS — K1379 Other lesions of oral mucosa: Secondary | ICD-10-CM

## 2020-05-17 DIAGNOSIS — I10 Essential (primary) hypertension: Secondary | ICD-10-CM | POA: Diagnosis not present

## 2020-05-17 DIAGNOSIS — R634 Abnormal weight loss: Secondary | ICD-10-CM

## 2020-05-17 MED ORDER — DOXYCYCLINE HYCLATE 100 MG PO TABS: 100.0000 mg | ORAL_TABLET | Freq: Two times a day (BID) | ORAL | 0 refills | Status: DC

## 2020-05-17 NOTE — Patient Instructions (Signed)
Dr Romie Minus and Hardie Shackleton [phone]

## 2020-05-17 NOTE — Progress Notes (Signed)
Established Patient Office Visit  Subjective:  Patient ID: [name], female    DOB: 01-Feb-1946  Age: 75 y.o. MRN: [id number]  CC:  Chief Complaint  Patient presents with  . Mouth Injury    HPI [name] presents for concern for possible "mouth injury".  She states that Thursday she was eating and she bit down on something and felt a sharp pain in her mouth upper gum region.  At the midline of the upper gum she has a post that was used for previous dentures and she feels like this pushed up into the bone further.  She has some mild discomfort today.  She has not noticed any bleeding.  No drainage.  She is worried about gum infection.  She states that she lost her dentures years ago and never went to get these replaced.  She is considering finding another oral surgeon who does dental implants again.  She has allergies/intolerance to clindamycin, sulfa, penicillin.  She had recent hematuria.  She has now had CT scan from urology as well as cystoscopy with apparently no source of bleeding noted.  We referred to GI and she received call yesterday and she still has to call back to set up actual appointment.  She is overdue for colonoscopy.  She has hypertension treated with losartan and Cardizem.  Past Medical History:  Diagnosis Date  . ABDOMINAL PAIN RIGHT UPPER QUADRANT 11/08/2008  . ALLERGIC RHINITIS 08/01/2009  . Anxiety   . Complication of anesthesia 01/31/2015   ? doesn't remember anything after being taken to pre surgery  . Coronary artery calcification seen on CAT scan 05/30/2009   moderate risk coronary calcium score by chest CT  . DDD (degenerative disc disease), lumbar   . DEPRESSION 06/13/2008  . DYSLIPIDEMIA 02/27/2009  . DYSPNEA 02/27/2009  . Dysrhythmia    Afib  . Frequency of urination   . GERD (gastroesophageal reflux disease)    "years ago"  . Head injury 2016   did not loose consciousness  . History of Bell's palsy   . History of DVT of lower  extremity 2006-- CHRONIC COUMADIN THERAPY  . History of hiatal hernia   . History of Lyme disease   . 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 Hypertension   . NON-HODGKIN'S LYMPHOMA, HX OF dx  2005--  chemoradiation completed 2006--  no recurrence   onocologist- dr odogwu--   . OSTEOARTHRITIS, MODERATE 04/07/2010  . Permanent atrial fibrillation (HCC)    On chronic anti-coagulation with warfarin  . Pneumonia    x 3 - last time 02/2018  . Retroperitoneal fibrosis   . Stroke [facility])    ? - [place]- "you have had 3 strokes"    Past Surgical History:  Procedure Laterality Date  . BREAST MASS EXCISION     BENIGN-- RIGHT BREAST  . CARDIOVASCULAR STRESS TEST  04-04-2009-- PER PT ASYMPTOMATIC-- MEDICAL MANAGEMENT   STUDY WAS EQUIVOCAL/ EF 49%/ GLOBAL HYPOKINESIS/ APPEARS TO BE A MILD ANTERIOR PERFUSION DEFECT COULD REPRESENT ISCHEMIA BUT DUE TO  ACTIVITY WORSE IN STRESS THAN AT REST POSS. THE DEFECT WAS ARTIFACTUAL  . CARDIOVERSION N/A 07/11/2015   Procedure: CARDIOVERSION;  Surgeon: Larey Dresser, MD;  Location: [place];  Service: Cardiovascular;  Laterality: N/A;  . CT ANGIOGRAM  FEB 2011   CALCIUM SCORE 161/ POSS. MODERATE MID LAD STENOSIS  . CYSTOSCOPY W/ RETROGRADES  04/28/2011   Procedure: CYSTOSCOPY WITH RETROGRADE PYELOGRAM;  Surgeon: Fredricka Bonine, MD;  Location: [place];  Service: Urology;  Laterality: Left;  . CYSTOSCOPY W/ URETERAL STENT REMOVAL  04/28/2011   Procedure: CYSTOSCOPY WITH STENT REMOVAL;  Surgeon: Fredricka Bonine, MD;  Location: [facility];  Service: Urology;;  . EXPLORATORY LAPAROTOMY  2005   LYMPHOMA  . EYE SURGERY Bilateral    cataract   . GAS INSERTION Right 03/31/2018   Procedure: RIGHT EYE INSERTION OF GAS C3F8;  Surgeon: Bernarda Caffey, MD;  Location: [place];  Service: Ophthalmology;  Laterality: Right;  . KNEE ARTHROSCOPY  2005   RIGHT  . LASER PHOTO ABLATION Right 03/31/2018    Procedure: RIGHT EYE LASER PHOTO ABLATION;  Surgeon: Bernarda Caffey, MD;  Location: [place];  Service: Ophthalmology;  Laterality: Right;  . MULTIPLE CYSTO/ LEFT URETERAL STENT EXCHANGES  LAST ONE 10-28-10  . RETROPERITONEAL BX  2007  . REVERSE SHOULDER ARTHROPLASTY Right 01/31/2015   Procedure: RIGHT REVERSE SHOULDER ARTHROPLASTY;  Surgeon: Justice Britain, MD;  Location: [place];  Service: Orthopedics;  Laterality: Right;  . TONSILLECTOMY  CHILD  . TOTAL KNEE ARTHROPLASTY  OCT 2010   RIGHT  . TOTAL KNEE ARTHROPLASTY  JAN 2010   LEFT  . TRANSTHORACIC ECHOCARDIOGRAM  [id number]   MILD SYSTOLIC DYSFUNCTION/ EF 53-66%/ MODERATE DIASTOLIC DYSFUCTION/ MILD MR/ MILD BIATRIAL ENLARGEMENT  . VITRECTOMY 25 GAUGE WITH SCLERAL BUCKLE Right 03/31/2018   Procedure: RIGHT VITRECTOMY 25 GAUGE WITH SCLERAL BUCKLE;  Surgeon: Bernarda Caffey, MD;  Location: [place];  Service: Ophthalmology;  Laterality: Right;    Family History  Problem Relation Age of Onset  . Arthritis Mother   . Heart disease Mother   . Heart attack Mother   . Diabetes Sister   . Hypertension Sister   . Stroke Sister   . Heart attack Sister     Social History   Socioeconomic History  . Marital status: Married    Spouse name: Not on file  . Number of children: Not on file  . Years of education: Not on file  . Highest education level: Not on file  Occupational History  . Not on file  Tobacco Use  . Smoking status: Former Smoker    Packs/day: 2.00    Years: 52.00    Pack years: 104.00    Types: Cigarettes    Quit date: 03/24/2008    Years since quitting: 12.1  . Smokeless tobacco: Never Used  Vaping Use  . Vaping Use: Never used  Substance and Sexual Activity  . Alcohol use: Not Currently    Comment: RARE  . Drug use: No  . Sexual activity: Not on file  Other Topics Concern  . Not on file  Social History Narrative  . Not on file   Social Determinants of Health   Financial Resource Strain: Medium Risk  . Difficulty of  Paying Living Expenses: Somewhat hard  Food Insecurity: No Food Insecurity  . Worried About Charity fundraiser in the Last Year: Never true  . Ran Out of Food in the Last Year: Never true  Transportation Needs: No Transportation Needs  . Lack of Transportation (Medical): No  . Lack of Transportation (Non-Medical): No  Physical Activity: Inactive  . Days of Exercise per Week: 0 days  . Minutes of Exercise per Session: 0 min  Stress: Stress Concern Present  . Feeling of Stress : To some extent  Social Connections: Moderately Isolated  . Frequency of Communication with Friends and Family: More than three times a week  .  Frequency of Social Gatherings with Friends and Family: More than three times a week  . Attends Religious Services: Never  . Active Member of Clubs or Organizations: No  . Attends Archivist Meetings: Never  . Marital Status: Married  Human resources officer Violence: Not At Risk  . Fear of Current or Ex-Partner: No  . Emotionally Abused: No  . Physically Abused: No  . Sexually Abused: No    Outpatient Medications Prior to Visit  Medication Sig Dispense Refill  . ALPRAZolam (XANAX) 1 MG tablet Take 1 tablet (1 mg total) by mouth 3 (three) times daily as needed. 90 tablet 2  . b complex vitamins capsule Take 2 capsules by mouth daily.     Marland Kitchen buPROPion (WELLBUTRIN XL) 300 MG 24 hr tablet TAKE 1 TABLET BY MOUTH EVERY DAY 90 tablet 3  . Cholecalciferol (VITAMIN D) 2000 UNITS CAPS Take 2,000 Int'l Units by mouth.    . Coenzyme Q10-Fish Oil-Vit E (CO-Q 10 OMEGA-3 FISH OIL PO) Take 100 mg by mouth daily.     Marland Kitchen diltiazem (CARDIZEM CD) 120 MG 24 hr capsule Take 1 capsule (120 mg total) by mouth daily. 90 capsule 3  . ELIQUIS 5 MG TABS tablet TAKE 1 TABLET BY MOUTH TWICE A DAY 60 tablet 5  . fluticasone (FLONASE) 50 MCG/ACT nasal spray instill 2 sprays into each nostril once daily 16 g 6  . furosemide (LASIX) 20 MG tablet Take 1 tablet (20 mg total) by mouth daily. Needs  appointment for refills. [phone] 90 tablet 1  . loratadine (CLARITIN) 10 MG tablet Take 10 mg by mouth daily.    Marland Kitchen losartan (COZAAR) 25 MG tablet TAKE 1 TABLET BY MOUTH DAILY. 90 tablet 3  . Lysine 500 MG TABS Take 500 mg by mouth daily.    . metoprolol succinate (TOPROL-XL) 25 MG 24 hr tablet Take 1 tablet (25 mg total) by mouth daily. 90 tablet 3  . Multiple Vitamins-Minerals (PRESERVISION AREDS 2 PO) Take 1 capsule by mouth in the morning and at bedtime.    . Pitavastatin Calcium (LIVALO) 2 MG TABS Take 1 tablet (2 mg total) by mouth daily. 30 tablet 11  . solifenacin (VESICARE) 5 MG tablet Take 5 mg by mouth daily.    . valACYclovir (VALTREX) 1000 MG tablet TAKE 2 TABLETS AT ONSET OF COLD SORES AND REPEAT 2 IN 12 HOURS. 30 tablet 1  . terbinafine (LAMISIL) 250 MG tablet      No facility-administered medications prior to visit.    Allergies  Allergen Reactions  . Chlorhexidine Base Itching    CHG WIPES  . Clindamycin/Lincomycin Other (See Comments)    Caused fire like sensation in her stomach   . Sulfa Antibiotics Other (See Comments)    Severe GI upset  . Penicillins Hives and Rash    DID THE REACTION INVOLVE: Swelling of the face/tongue/throat, SOB, or low BP? No Sudden or severe rash/hives, skin peeling, or the inside of the mouth or nose? No Did it require medical treatment? No When did it last happen?90s If all above answers are "NO", may proceed with cephalosporin use.     ROS Review of Systems  Constitutional: Negative for chills, fatigue and fever.  Eyes: Negative for visual disturbance.  Respiratory: Negative for cough, chest tightness, shortness of breath and wheezing.   Cardiovascular: Negative for chest pain, palpitations and leg swelling.  Neurological: Negative for dizziness, seizures, syncope, weakness, light-headedness and headaches.      Objective:  Physical Exam Vitals reviewed.  Constitutional:      Appearance: Normal appearance.  HENT:      Mouth/Throat:     Comments: She has very poor dentition in general.  She has metal post mid upper gum and this appears to be anchored firmly in place.  There is no surrounding gum irritation or bleeding. Cardiovascular:     Rate and Rhythm: Normal rate.  Pulmonary:     Effort: Pulmonary effort is normal.     Breath sounds: Normal breath sounds.  Neurological:     Mental Status: She is alert.     BP 120/80   Pulse (!) 47   Ht $R'5\' 6"'WV$  (1.676 m)   Wt 192 lb (87.1 kg)   SpO2 94%   BMI 30.99 kg/m  Wt Readings from Last 3 Encounters:  05/17/20 192 lb (87.1 kg)  05/03/20 195 lb (88.5 kg)  03/11/20 206 lb (93.4 kg)     There are no preventive care reminders to display for this patient.  There are no preventive care reminders to display for this patient.  Lab Results  Component Value Date   TSH 2.34 03/12/2020   Lab Results  Component Value Date   WBC 5.0 03/12/2020   HGB 11.7 03/12/2020   HCT 35.0 03/12/2020   MCV 92.3 03/12/2020   PLT 157 03/12/2020   Lab Results  Component Value Date   NA 143 03/12/2020   K 5.0 03/12/2020   CHLORIDE 105 05/07/2015   CO2 27 03/12/2020   GLUCOSE 125 (H) 03/12/2020   BUN 14 03/12/2020   CREATININE 1.10 (H) 03/12/2020   BILITOT 0.5 03/12/2020   ALKPHOS 80 02/01/2018   AST 18 03/12/2020   ALT 20 03/12/2020   PROT 6.1 03/12/2020   ALBUMIN 3.9 02/01/2018   CALCIUM 9.3 03/12/2020   ANIONGAP 13 04/06/2018   EGFR 42 (L) 05/07/2015   GFR 47.96 (L) 05/31/2015   Lab Results  Component Value Date   CHOL 123 08/23/2019   Lab Results  Component Value Date   HDL 51 08/23/2019   Lab Results  Component Value Date   LDLCALC 57 08/23/2019   Lab Results  Component Value Date   TRIG 76 08/23/2019   Lab Results  Component Value Date   CHOLHDL 2.4 08/23/2019   Lab Results  Component Value Date   HGBA1C 6.7 (A) 04/08/2018      Assessment & Plan:   #1 mouth pain.  She has met all stub which is firmly anchored into the upper  gum at the midline and appears to be in appropriate place.  We have recommend that she consult with oral surgeon especially if having ongoing issues.  She was given name and number to call -We did agree to doxycycline to start over the weekend if she sees signs of gum erythema or swelling.  At this point she has none.  She cannot tolerate penicillin, clindamycin, or sulfa medications  #2 hypertension stable and at goal -Continue current medications  #3 gradual weight loss.  Recent urologic work-up negative for hematuria.  Recent chest x-ray unremarkable.  Patient overdue for repeat colonoscopy and she is in process of setting this up  Meds ordered this encounter  Medications  . doxycycline (VIBRA-TABS) 100 MG tablet    Sig: Take 1 tablet (100 mg total) by mouth 2 (two) times daily.    Dispense:  20 tablet    Refill:  0    Follow-up: No follow-ups on file.  Carolann Littler, MD

## 2020-05-20 ENCOUNTER — Telehealth: Payer: Self-pay | Admitting: *Deleted

## 2020-05-20 NOTE — Telephone Encounter (Signed)
   [facility] Pre-operative Risk Assessment    HEARTCARE STAFF: - Please ensure there is not already an duplicate clearance open for this procedure. - Under Visit Info/Reason for Call, type in Other and utilize the format Clearance MM/DD/YY or Clearance TBD. Do not use dashes or single digits. - If request is for dental extraction, please clarify the # of teeth to be extracted.  Request for surgical clearance:  1. What type of surgery is being performed? 1 DENTAL IMPLANT TO BE REMOVED   2. When is this surgery scheduled? TBD   3. What type of clearance is required (medical clearance vs. Pharmacy clearance to hold med vs. Both)? BOTH  4. Are there any medications that need to be held prior to surgery and how long? ELIQUIS   5. Practice name and name of physician performing surgery? [facility] [place]; DR. Nicki Reaper ReHM   6. What is the office phone number? [phone]   7.   What is the office fax number? [phone]  8.   Anesthesia type (None, local, MAC, general) ? NONE LISTED    [name] 05/20/2020, 3:23 PM  _________________________________________________________________   (provider comments below)

## 2020-05-21 NOTE — Telephone Encounter (Signed)
Notes faxed to surgeon. This phone note will be removed from the preop pool. Richardson Dopp, PA-C  05/21/2020 4:04 PM

## 2020-05-21 NOTE — Telephone Encounter (Signed)
   Primary Cardiologist: Fransico Him, MD  Chart reviewed as part of pre-operative protocol coverage.   Simple dental extractions are considered low risk procedures per guidelines and generally do not require any specific cardiac clearance. It is also generally accepted that for simple extractions and dental cleanings, there is no need to interrupt blood thinner therapy.  SBE prophylaxis is not required for the patient from a cardiac standpoint.   Please call with questions. Richardson Dopp, PA-C 05/21/2020, 4:00 PM

## 2020-05-23 ENCOUNTER — Telehealth: Payer: Self-pay | Admitting: Cardiology

## 2020-05-23 NOTE — Telephone Encounter (Signed)
Pt c/o medication issue:  1. Name of Medication: Coenzyme Q10-Fish Oil-Vit E (CO-Q 10 OMEGA-3 FISH OIL PO)  2. How are you currently taking this medication (dosage and times per day)? 1 capsule by mouth twice daily  3. Are you having a reaction (difficulty breathing--STAT)? No   4. What is your medication issue?   Patient states she has concerns regarding this medication and she will discuss further when she speaks with the nurse.

## 2020-05-24 NOTE — Telephone Encounter (Signed)
Returned call to pt and left message

## 2020-05-27 NOTE — Telephone Encounter (Signed)
Follow up:    Patient returning a call back from last week. Please call patient.

## 2020-05-27 NOTE — Telephone Encounter (Signed)
Left message for patient to call back  

## 2020-05-28 NOTE — Telephone Encounter (Signed)
Spoke with the patient who states that she is taking CO Q-10 that includes EPA and DHA. She states that she read an article that individuals with atrial fibrillation should not take this. She is wondering if she should be getting CO Q-10 that does not include these. She states she needs to go to the store and pick up more because she is almost out and whats to know which specific CO Q-10 is best for her.

## 2020-05-28 NOTE — Telephone Encounter (Signed)
[name] is returning [place] call. Please callback on [phone] the other number is not showing she has missed calls. Please advise.

## 2020-05-28 NOTE — Telephone Encounter (Signed)
Spoke with the patient and gave her recommendations from [place]. Patient verbalized understanding.

## 2020-05-28 NOTE — Telephone Encounter (Signed)
No cardiovascular benefit with DHA/EPA combo medications. Also is some risk of afib. Would suggest she stop taking.  Would get any coQ10 that has a USP seal on it- 100-200mg 

## 2020-06-05 ENCOUNTER — Encounter: Payer: Self-pay | Admitting: Gastroenterology

## 2020-06-05 ENCOUNTER — Ambulatory Visit (INDEPENDENT_AMBULATORY_CARE_PROVIDER_SITE_OTHER): Payer: Medicare Other | Admitting: Gastroenterology

## 2020-06-05 ENCOUNTER — Telehealth: Payer: Self-pay

## 2020-06-05 VITALS — BP 100/72 | HR 73 | Ht 66.0 in | Wt 188.4 lb

## 2020-06-05 DIAGNOSIS — R634 Abnormal weight loss: Secondary | ICD-10-CM

## 2020-06-05 MED ORDER — SUTAB 1479-225-188 MG PO TABS: 1.0000 | ORAL_TABLET | Freq: Once | ORAL | 0 refills | Status: AC

## 2020-06-05 NOTE — Telephone Encounter (Signed)
[facility] Pre-operative Risk Assessment     Request for surgical clearance:     Endoscopy Procedure  What type of surgery is being performed?  Endoscopy and   Colonoscopy  When is this surgery scheduled?     08/13/20  What type of clearance is required ?   Pharmacy  Are there any medications that need to be held prior to surgery and how long? Eliquis 2 days   Practice name and name of physician performing surgery?      [facility]  What is your office phone and fax number?      Phone- [phone]  [phone]  Anesthesia type (None, local, MAC, general) ?       MAC

## 2020-06-05 NOTE — Patient Instructions (Signed)
If you are age 77 or older, your body mass index should be between 23-30. Your Body mass index is 30.41 kg/m. If this is out of the aforementioned range listed, please consider follow up with your Primary Care Provider.  If you are age 32 or younger, your body mass index should be between 19-25. Your Body mass index is 30.41 kg/m. If this is out of the aformentioned range listed, please consider follow up with your Primary Care Provider.   You have been scheduled for an endoscopy and colonoscopy. Please follow the written instructions given to you at your visit today. Please pick up your prep supplies at the pharmacy within the next 1-3 days. If you use inhalers (even only as needed), please bring them with you on the day of your procedure.  You will be contaced by our office prior to your procedure for directions on holding your  Eliquis.  If you do not hear from our office 1 week prior to your scheduled procedure, please call [phone] to discuss.  Thank you for choosing me and [facility].  Alonza Bogus, PA-C

## 2020-06-05 NOTE — Progress Notes (Signed)
06/05/2020 [name] [id number] [birth year]   HISTORY OF PRESENT ILLNESS: This is a 75 year old lady who is previously a patient of Dr. Nichola Sizer, not seen here since 2008.  She has history of colon polyps and had several polyps removed during that last colonoscopy.  He also has history of diverticulosis.  All of the polyps were removed in 2008 were hyperplastic polyps or mucosal prolapse polyps.  She is here today with complaints of weight loss.  She tells me that she is depressed and really has no appetite or desire to cook or eat much.  She says that she has lost a lot of weight so has tried to increase her food intake, but still does not eat much at all.  She denies any significant GI symptoms.  She says she used to have GERD, but no longer has any issues and is no longer on treatment.  She did have 2 days of blood in her urine and saw urology.  She tells me she had a CT scan performed at their office that was unremarkable.  She denies seeing any blood in her stools.  She prefers a female physician.  She is on Eliquis for atrial fibrillation and history of DVTs.  This is prescribed by her cardiologist, Dr. Radford Pax.   Past Medical History:  Diagnosis Date  . ABDOMINAL PAIN RIGHT UPPER QUADRANT 11/08/2008  . ALLERGIC RHINITIS 08/01/2009  . Anxiety   . Complication of anesthesia 01/31/2015   ? doesn't remember anything after being taken to pre surgery  . Coronary artery calcification seen on CAT scan 05/30/2009   moderate risk coronary calcium score by chest CT  . DDD (degenerative disc disease), lumbar   . DEPRESSION 06/13/2008  . DYSLIPIDEMIA 02/27/2009  . DYSPNEA 02/27/2009  . Dysrhythmia    Afib  . Frequency of urination   . GERD (gastroesophageal reflux disease)    "years ago"  . Head injury 2016   did not loose consciousness  . History of Bell's palsy   . History of DVT of lower extremity 2006-- CHRONIC COUMADIN THERAPY  . History of hiatal hernia   . History of  Lyme disease   . 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 Hypertension   . NON-HODGKIN'S LYMPHOMA, HX OF dx  2005--  chemoradiation completed 2006--  no recurrence   onocologist- dr odogwu--   . OSTEOARTHRITIS, MODERATE 04/07/2010  . Permanent atrial fibrillation (HCC)    On chronic anti-coagulation with warfarin  . Pneumonia    x 3 - last time 02/2018  . Retroperitoneal fibrosis   . Stroke [facility])    ? - [place]- "you have had 3 strokes"   Past Surgical History:  Procedure Laterality Date  . BREAST MASS EXCISION     BENIGN-- RIGHT BREAST  . CARDIOVASCULAR STRESS TEST  04-04-2009-- PER PT ASYMPTOMATIC-- MEDICAL MANAGEMENT   STUDY WAS EQUIVOCAL/ EF 49%/ GLOBAL HYPOKINESIS/ APPEARS TO BE A MILD ANTERIOR PERFUSION DEFECT COULD REPRESENT ISCHEMIA BUT DUE TO  ACTIVITY WORSE IN STRESS THAN AT REST POSS. THE DEFECT WAS ARTIFACTUAL  . CARDIOVERSION N/A 07/11/2015   Procedure: CARDIOVERSION;  Surgeon: Larey Dresser, MD;  Location: [place];  Service: Cardiovascular;  Laterality: N/A;  . CT ANGIOGRAM  FEB 2011   CALCIUM SCORE 161/ POSS. MODERATE MID LAD STENOSIS  . CYSTOSCOPY W/ RETROGRADES  04/28/2011   Procedure: CYSTOSCOPY WITH RETROGRADE PYELOGRAM;  Surgeon: Fredricka Bonine, MD;  Location: [facility];  Service:  Urology;  Laterality: Left;  . CYSTOSCOPY W/ URETERAL STENT REMOVAL  04/28/2011   Procedure: CYSTOSCOPY WITH STENT REMOVAL;  Surgeon: Fredricka Bonine, MD;  Location: [facility];  Service: Urology;;  . EXPLORATORY LAPAROTOMY  2005   LYMPHOMA  . EYE SURGERY Bilateral    cataract   . GAS INSERTION Right 03/31/2018   Procedure: RIGHT EYE INSERTION OF GAS C3F8;  Surgeon: Bernarda Caffey, MD;  Location: [place];  Service: Ophthalmology;  Laterality: Right;  . KNEE ARTHROSCOPY  2005   RIGHT  . LASER PHOTO ABLATION Right 03/31/2018   Procedure: RIGHT EYE LASER PHOTO ABLATION;  Surgeon: Bernarda Caffey, MD;  Location: [place];  Service: Ophthalmology;  Laterality: Right;  . MULTIPLE CYSTO/ LEFT URETERAL STENT EXCHANGES  LAST ONE 10-28-10  . RETROPERITONEAL BX  2007  . REVERSE SHOULDER ARTHROPLASTY Right 01/31/2015   Procedure: RIGHT REVERSE SHOULDER ARTHROPLASTY;  Surgeon: Justice Britain, MD;  Location: [place];  Service: Orthopedics;  Laterality: Right;  . TONSILLECTOMY  CHILD  . TOTAL KNEE ARTHROPLASTY  OCT 2010   RIGHT  . TOTAL KNEE ARTHROPLASTY  JAN 2010   LEFT  . TRANSTHORACIC ECHOCARDIOGRAM  [id number]   MILD SYSTOLIC DYSFUNCTION/ EF 09-32%/ MODERATE DIASTOLIC DYSFUCTION/ MILD MR/ MILD BIATRIAL ENLARGEMENT  . VITRECTOMY 25 GAUGE WITH SCLERAL BUCKLE Right 03/31/2018   Procedure: RIGHT VITRECTOMY 25 GAUGE WITH SCLERAL BUCKLE;  Surgeon: Bernarda Caffey, MD;  Location: [place];  Service: Ophthalmology;  Laterality: Right;    reports that she quit smoking about 12 years ago. Her smoking use included cigarettes. She has a 104.00 pack-year smoking history. She has never used smokeless tobacco. She reports previous alcohol use. She reports that she does not use drugs. family history includes Arthritis in her mother; Diabetes in her sister; Heart attack in her mother and sister; Heart disease in her mother; Hypertension in her sister; Liver disease in her son; Stroke in her sister. Allergies  Allergen Reactions  . Chlorhexidine Base Itching    CHG WIPES  . Clindamycin/Lincomycin Other (See Comments)    Caused fire like sensation in her stomach   . Sulfa Antibiotics Other (See Comments)    Severe GI upset  . Penicillins Hives and Rash    DID THE REACTION INVOLVE: Swelling of the face/tongue/throat, SOB, or low BP? No Sudden or severe rash/hives, skin peeling, or the inside of the mouth or nose? No Did it require medical treatment? No When did it last happen?90s If all above answers are "NO", may proceed with cephalosporin use.       Outpatient Encounter Medications as of 06/05/2020  Medication Sig  . ALPRAZolam  (XANAX) 1 MG tablet Take 1 tablet (1 mg total) by mouth 3 (three) times daily as needed.  Marland Kitchen b complex vitamins capsule Take 2 capsules by mouth daily.   Marland Kitchen buPROPion (WELLBUTRIN XL) 300 MG 24 hr tablet TAKE 1 TABLET BY MOUTH EVERY DAY  . Cholecalciferol (VITAMIN D) 2000 UNITS CAPS Take 2,000 Int'l Units by mouth.  . Coenzyme Q10-Fish Oil-Vit E (CO-Q 10 OMEGA-3 FISH OIL PO) Take 100 mg by mouth daily.   Marland Kitchen diltiazem (CARDIZEM CD) 120 MG 24 hr capsule Take 1 capsule (120 mg total) by mouth daily.  Marland Kitchen doxycycline (VIBRA-TABS) 100 MG tablet Take 1 tablet (100 mg total) by mouth 2 (two) times daily.  . DULoxetine (CYMBALTA) 60 MG capsule Take 60 mg by mouth daily.  Marland Kitchen ELIQUIS 5 MG TABS tablet TAKE 1 TABLET BY MOUTH TWICE  A DAY  . fluticasone (FLONASE) 50 MCG/ACT nasal spray instill 2 sprays into each nostril once daily  . furosemide (LASIX) 20 MG tablet Take 1 tablet (20 mg total) by mouth daily. Needs appointment for refills. [phone]  . gatifloxacin (ZYMAXID) 0.5 % SOLN Place 1 drop into the right eye in the morning, at noon, in the evening, and at bedtime.  Marland Kitchen loratadine (CLARITIN) 10 MG tablet Take 10 mg by mouth daily.  Marland Kitchen losartan (COZAAR) 25 MG tablet TAKE 1 TABLET BY MOUTH DAILY.  Marland Kitchen Lysine 500 MG TABS Take 500 mg by mouth daily.  . metoprolol succinate (TOPROL-XL) 25 MG 24 hr tablet Take 1 tablet (25 mg total) by mouth daily.  . Multiple Vitamins-Minerals (PRESERVISION AREDS 2 PO) Take 1 capsule by mouth in the morning and at bedtime.  . Pitavastatin Calcium (LIVALO) 2 MG TABS Take 1 tablet (2 mg total) by mouth daily.  . valACYclovir (VALTREX) 1000 MG tablet TAKE 2 TABLETS AT ONSET OF COLD SORES AND REPEAT 2 IN 12 HOURS.  . [DISCONTINUED] solifenacin (VESICARE) 5 MG tablet Take 5 mg by mouth daily.   No facility-administered encounter medications on file as of 06/05/2020.     REVIEW OF SYSTEMS  : All other systems reviewed and negative except where noted in the History of Present  Illness.   PHYSICAL EXAM: BP 100/72   Pulse 73   Ht 5\' 6"  (1.676 m)   Wt 188 lb 6.4 oz (85.5 kg)   SpO2 99%   BMI 30.41 kg/m  General: Well developed white female in no acute distress Head: Normocephalic and atraumatic Eyes:  Sclerae anicteric, conjunctiva pink. Ears: Normal auditory acuity Lungs: Clear throughout to auscultation; no W/R/R. Heart: Regular rate and rhythm; no M/R/G. Abdomen: Soft, non-distended.  BS present.  Non-tender. Rectal:  Will be done at the time of colonoscopy. Musculoskeletal: Symmetrical with no gross deformities  Skin: No lesions on visible extremities Extremities: No edema  Neurological: Alert oriented x 4, grossly non-focal Psychological:  Alert and cooperative. Normal mood and affect  ASSESSMENT AND PLAN: *74 year old female here with primary complaint of weight loss: She reports having a CT scan done at urology that was unremarkable.  She has not had EGD or colonoscopy in many years.  Her weight loss is most likely secondary to her decrease in p.o. intake.  She admits to being depressed and not feeling like cooking, and nothing sounds good to her.  We will proceed with EGD and colonoscopy just to be sure there is no other issues.  Will schedule with Dr. Silverio Decamp. *Chronic anticoagulation use with Eliquis for atrial fibrillation as prescribed by her Dr. Radford Pax:  Will hold Eliquis for 2 days prior to endoscopic procedures - will instruct when and how to resume after procedure. Benefits and risks of procedure explained including risks of bleeding, perforation, infection, missed lesions, reactions to medications and possible need for hospitalization and surgery for complications. Additional rare but real risk of stroke or other vascular clotting events off of Eliquis also explained and need to seek urgent help if any signs of these problems occur. Will communicate by phone or EMR with patient's  prescribing provider, Dr. Radford Pax, to confirm that holding Eliquis  is reasonable in this case.    CC:  [name], MD

## 2020-06-06 NOTE — Telephone Encounter (Signed)
Agree with only holding anticoagulation 1 day

## 2020-06-06 NOTE — Telephone Encounter (Signed)
   Primary Cardiologist: Fransico Him, MD  Chart reviewed as part of pre-operative protocol coverage.  Per pharmacy and Dr. Theodosia Blender recommendations, patient can hold eliquis 1 ay prior to her upcoming endoscopy/colonoscopy with plans to restart as soon as she is cleared to do so by her gastroenterologist. Please note this recommendations is for less time than requested due to patients high risk.  I will route this recommendation to the requesting party via Epic fax function and remove from pre-op pool.  Please call with questions.  Abigail Butts, PA-C 06/06/2020, 4:30 PM

## 2020-06-06 NOTE — Telephone Encounter (Signed)
Patient with diagnosis of afib on Eliquis for anticoagulation.    Procedure: endoscopy and colonoscopy Date of procedure: 08/13/20  CHA2DS2-VASc Score = 9  This indicates a 12.2% annual risk of stroke. The patient's score is based upon: CHF History: Yes HTN History: Yes Diabetes History: Yes Stroke History: Yes Vascular Disease History: Yes Age Score: 2 Gender Score: 1   Also has remote history of DVT  CrCl 25mL/min using adjusted boyd weight due to obesity Platelet count 157K  Request is to hold Eliquis for 2 days prior to endoscopy/colonoscopy. Would prefer 1 day hold given pt's CHADS2VASc score of 9. Will defer to MD for input.

## 2020-06-07 ENCOUNTER — Telehealth: Payer: Self-pay | Admitting: Family Medicine

## 2020-06-07 DIAGNOSIS — R634 Abnormal weight loss: Secondary | ICD-10-CM

## 2020-06-07 NOTE — Telephone Encounter (Signed)
I suggest we get repeat CBC, TIBC/iron, and serum ferritin.  If iron is low and hemoglobin dropping we will see if we can push up her referral sooner

## 2020-06-07 NOTE — Telephone Encounter (Signed)
Patient is calling and stated that he was referred to [facility] but can't get in until 5/24. Pt wanted to know if that is too long to wait, please advise. CB is [phone]

## 2020-06-07 NOTE — Telephone Encounter (Signed)
I left pt a voicemail to call back and schedule a lab appt.

## 2020-06-10 NOTE — Telephone Encounter (Signed)
Cardiology recommends patient holds Eliquis one day prior to procedure due to patients high risk.

## 2020-06-10 NOTE — Telephone Encounter (Signed)
I spoke with pt. Lab appointment made for 3/31 at 2pm.

## 2020-06-10 NOTE — Telephone Encounter (Signed)
The patient called wanting to talk with Dr. Elease Hashimoto or his nurse. She received a voicemail but wasn't able to understand it and that's why she prefers to speak directly to the nurse.  Please asdvise

## 2020-06-10 NOTE — Telephone Encounter (Signed)
Will you forward it to Dr. Silverio Decamp to see if she is okay with that?  Thank you,  Jess

## 2020-06-12 ENCOUNTER — Telehealth: Payer: Self-pay | Admitting: Pharmacist

## 2020-06-12 NOTE — Chronic Care Management (AMB) (Addendum)
I left the patient a message about her upcoming appointment on 06/13/2020 @ 3;30 pmwith the clinical pharmacist. She was asked to please have all medication on hand to review with the pharmacist.    [name]) Mare Ferrari, [place] Assistant [phone]   Patient called back and canceled appt as she has a lot going on right now. Plan to reach out in a few months to reschedule.  Jeni Salles, PharmD [facility] Clinical Pharmacist [place] [facility] [place]

## 2020-06-13 ENCOUNTER — Telehealth: Payer: Medicare Other

## 2020-06-14 ENCOUNTER — Other Ambulatory Visit: Payer: Self-pay | Admitting: Family Medicine

## 2020-06-14 ENCOUNTER — Other Ambulatory Visit: Payer: Self-pay

## 2020-06-14 ENCOUNTER — Ambulatory Visit: Payer: Medicare Other | Admitting: Podiatry

## 2020-06-14 DIAGNOSIS — M2041 Other hammer toe(s) (acquired), right foot: Secondary | ICD-10-CM

## 2020-06-14 DIAGNOSIS — Z872 Personal history of diseases of the skin and subcutaneous tissue: Secondary | ICD-10-CM | POA: Diagnosis not present

## 2020-06-14 DIAGNOSIS — M2042 Other hammer toe(s) (acquired), left foot: Secondary | ICD-10-CM

## 2020-06-16 ENCOUNTER — Encounter: Payer: Self-pay | Admitting: Podiatry

## 2020-06-16 NOTE — Progress Notes (Signed)
Subjective: Patient presents today with h/o DVT with c/o corns distal tip of digits 2-4 b/l feet and  painful, discolored, thick toenails which interfere with daily activities. Pain is aggravated when wearing enclosed shoe gear. Pain is getting progressively worse and relieved with periodic professional debridement.  She has remote history of ulceration distal tip of left 2nd digit. She states she hopes it is healed. Denies any swelling, redness, or drainage.  Patient states she is still being worked up for unintentional weight loss.  She is on blood thinner, Eliquis.  Mrs. Canino and her husband are also currently moving in with their son as they require more assistance with their health issues.   [name], MD is her PCP. Last visit was 05/17/2020.   Allergies  Allergen Reactions  . Chlorhexidine Base Itching    CHG WIPES  . Clindamycin/Lincomycin Other (See Comments)    Caused fire like sensation in her stomach   . Sulfa Antibiotics Other (See Comments)    Severe GI upset  . Penicillins Hives and Rash    DID THE REACTION INVOLVE: Swelling of the face/tongue/throat, SOB, or low BP? No Sudden or severe rash/hives, skin peeling, or the inside of the mouth or nose? No Did it require medical treatment? No When did it last happen?90s If all above answers are "NO", may proceed with cephalosporin use.      Objective: There were no vitals filed for this visit.  75 y.o. Caucasian female, obese, in NAD. AAO x 3.  Vascular Examination: Capillary refill time to digits immediate b/l. Palpable DP pulses b/l. Palpable PT pulses b/l. Pedal hair present b/l. Skin temperature gradient within normal limits b/l. Unilateral edema left LE.  Dermatological Examination: Pedal skin with normal turgor, texture and tone bilaterally. No interdigital macerations bilaterally. Toenails 1-5 b/l recently debrided. Digits 2-4 b/l  lesion with visible subdermal hemorrhage improved. No edema, no  erythema, no drainage, no fluctuance. No underlying wounds noted postdebridement on today's visit.  Musculoskeletal Examination: Normal muscle strength 5/5 to all lower extremity muscle groups bilaterally. No pain crepitus or joint limitation noted with ROM b/l. Hammertoes noted to the 2-5 bilaterally.  Neurological Examination: Protective sensation intact 5/5 intact bilaterally with 10g monofilament b/l. Vibratory sensation intact b/l.  Assessment: Hammer toes of both feet  History of skin ulcer  Plan: -Examined patient. -Ulcer  remains healed  -Continue toe crests for each foot for daily protection. They appear to be working.  Apply every morning, remove every evening. -Patient to continue soft, supportive shoe gear daily -Follow up 4-5 weeks and keep a closer eye on her since she is having other health issues and cannot have flexor tenotomies performed at this time due to work up of other health issues. -Patient to report any pedal injuries to medical professional immediately. -She or POA are to call should she encounter any new problems.   Marzetta Board, DPM

## 2020-06-17 NOTE — Telephone Encounter (Signed)
Pt is calling in stating that she is out of Rx alprazolam Duanne Moron) 1 MG  Pharm:  [facility] on [address], [place]  Pt would like to have a call once it has been called in, she has someone that is waiting to pick it up.

## 2020-06-17 NOTE — Telephone Encounter (Signed)
Patient is calling back to check the status of her medication refill, please advise. CB is [phone]

## 2020-06-17 NOTE — Telephone Encounter (Signed)
Last filled 03/11/20 Last OV 05/17/2020  Ok to fill?

## 2020-06-17 NOTE — Telephone Encounter (Signed)
Fine with holding Eliquis for 1 day prior to procedure. Thanks

## 2020-06-17 NOTE — Telephone Encounter (Signed)
Spoke with the patient. She is aware that her refill request has been sent to Dr. Elease Hashimoto and we are waiting for his response.

## 2020-06-19 NOTE — Telephone Encounter (Signed)
Called patient and let her know to hold Eliquis one day prior to procedure.Patient stayed she understood.

## 2020-06-20 ENCOUNTER — Other Ambulatory Visit: Payer: Medicare Other

## 2020-06-20 ENCOUNTER — Other Ambulatory Visit: Payer: Self-pay

## 2020-06-21 ENCOUNTER — Encounter: Payer: Self-pay | Admitting: Family Medicine

## 2020-06-21 ENCOUNTER — Ambulatory Visit (INDEPENDENT_AMBULATORY_CARE_PROVIDER_SITE_OTHER): Payer: Medicare Other | Admitting: Family Medicine

## 2020-06-21 VITALS — BP 130/64 | HR 86 | Temp 97.8°F | Wt 193.8 lb

## 2020-06-21 DIAGNOSIS — R634 Abnormal weight loss: Secondary | ICD-10-CM

## 2020-06-21 DIAGNOSIS — S8012XA Contusion of left lower leg, initial encounter: Secondary | ICD-10-CM | POA: Diagnosis not present

## 2020-06-21 DIAGNOSIS — D649 Anemia, unspecified: Secondary | ICD-10-CM

## 2020-06-21 LAB — CBC WITH DIFFERENTIAL/PLATELET
Basophils Absolute: 0 10*3/uL (ref 0.0–0.1)
Basophils Relative: 0.6 % (ref 0.0–3.0)
Eosinophils Absolute: 0.1 10*3/uL (ref 0.0–0.7)
Eosinophils Relative: 1.1 % (ref 0.0–5.0)
HCT: 35.4 % — ABNORMAL LOW (ref 36.0–46.0)
Hemoglobin: 11.9 g/dL — ABNORMAL LOW (ref 12.0–15.0)
Lymphocytes Relative: 29.9 % (ref 12.0–46.0)
Lymphs Abs: 2 10*3/uL (ref 0.7–4.0)
MCHC: 33.6 g/dL (ref 30.0–36.0)
MCV: 91.8 fl (ref 78.0–100.0)
Monocytes Absolute: 0.6 10*3/uL (ref 0.1–1.0)
Monocytes Relative: 8.7 % (ref 3.0–12.0)
Neutro Abs: 3.9 10*3/uL (ref 1.4–7.7)
Neutrophils Relative %: 59.7 % (ref 43.0–77.0)
Platelets: 194 10*3/uL (ref 150.0–400.0)
RBC: 3.86 Mil/uL — ABNORMAL LOW (ref 3.87–5.11)
RDW: 14.2 % (ref 11.5–15.5)
WBC: 6.5 10*3/uL (ref 4.0–10.5)

## 2020-06-21 NOTE — Progress Notes (Signed)
Established Patient Office Visit  Subjective:  Patient ID: [name], female    DOB: 01/31/1946  Age: 75 y.o. MRN: [id number]  CC:  Chief Complaint  Patient presents with  . Fall    X 2 weeks, fell in the bathroom, hit both knees, left chest/shoulder painful    HPI [name] presents for recent fall which occurred 2 weeks ago in her bathroom.  She states she had actually slipped on spilled water.  She slipped on the water and fell basically onto both knees.  She has had previous left total knee replacement.  She went to catch herself on a bath bench.  She has had some left arm pain since then.  She noticed some bruising and slight pain left leg.  Ambulating left leg with no difficulty.  She had some chronic left shoulder pain which is unchanged.  She has had previous right shoulder replacement.  She related recent left chest wall pain which is worse with breathing and this occurred the day after her injury but that pain is resolved at this time.  No recent increased dyspnea.  She is on chronic anticoagulation with Eliquis.  She has longstanding history of anxiety.  She is on Xanax and many times in the past we discussed tapering.  She has been very reluctant.  She does not feel that she has any balance issues and she states this was a slip on water.  She had recent CBC with hgb 11.7 which is borderline low.  She had been losing weight steadily and unintentionally but her weight loss does seem to have stabilized.  No bloody stools or melena.   Past Medical History:  Diagnosis Date  . ABDOMINAL PAIN RIGHT UPPER QUADRANT 11/08/2008  . ALLERGIC RHINITIS 08/01/2009  . Anxiety   . Complication of anesthesia 01/31/2015   ? doesn't remember anything after being taken to pre surgery  . Coronary artery calcification seen on CAT scan 05/30/2009   moderate risk coronary calcium score by chest CT  . DDD (degenerative disc disease), lumbar   . DEPRESSION 06/13/2008  . DYSLIPIDEMIA  02/27/2009  . DYSPNEA 02/27/2009  . Dysrhythmia    Afib  . Frequency of urination   . GERD (gastroesophageal reflux disease)    "years ago"  . Head injury 2016   did not loose consciousness  . History of Bell's palsy   . History of DVT of lower extremity 2006-- CHRONIC COUMADIN THERAPY  . History of hiatal hernia   . History of Lyme disease   . 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 Hypertension   . NON-HODGKIN'S LYMPHOMA, HX OF dx  2005--  chemoradiation completed 2006--  no recurrence   onocologist- dr odogwu--   . OSTEOARTHRITIS, MODERATE 04/07/2010  . Permanent atrial fibrillation (HCC)    On chronic anti-coagulation with warfarin  . Pneumonia    x 3 - last time 02/2018  . Retroperitoneal fibrosis   . Stroke [facility])    ? - [place]- "you have had 3 strokes"    Past Surgical History:  Procedure Laterality Date  . BREAST MASS EXCISION     BENIGN-- RIGHT BREAST  . CARDIOVASCULAR STRESS TEST  04-04-2009-- PER PT ASYMPTOMATIC-- MEDICAL MANAGEMENT   STUDY WAS EQUIVOCAL/ EF 49%/ GLOBAL HYPOKINESIS/ APPEARS TO BE A MILD ANTERIOR PERFUSION DEFECT COULD REPRESENT ISCHEMIA BUT DUE TO  ACTIVITY WORSE IN STRESS THAN AT REST POSS. THE DEFECT WAS ARTIFACTUAL  . CARDIOVERSION N/A 07/11/2015   Procedure:  CARDIOVERSION;  Surgeon: Larey Dresser, MD;  Location: [place];  Service: Cardiovascular;  Laterality: N/A;  . CT ANGIOGRAM  FEB 2011   CALCIUM SCORE 161/ POSS. MODERATE MID LAD STENOSIS  . CYSTOSCOPY W/ RETROGRADES  04/28/2011   Procedure: CYSTOSCOPY WITH RETROGRADE PYELOGRAM;  Surgeon: Fredricka Bonine, MD;  Location: [facility];  Service: Urology;  Laterality: Left;  . CYSTOSCOPY W/ URETERAL STENT REMOVAL  04/28/2011   Procedure: CYSTOSCOPY WITH STENT REMOVAL;  Surgeon: Fredricka Bonine, MD;  Location: [facility];  Service: Urology;;  . EXPLORATORY LAPAROTOMY  2005   LYMPHOMA  . EYE SURGERY Bilateral     cataract   . GAS INSERTION Right 03/31/2018   Procedure: RIGHT EYE INSERTION OF GAS C3F8;  Surgeon: Bernarda Caffey, MD;  Location: [place];  Service: Ophthalmology;  Laterality: Right;  . KNEE ARTHROSCOPY  2005   RIGHT  . LASER PHOTO ABLATION Right 03/31/2018   Procedure: RIGHT EYE LASER PHOTO ABLATION;  Surgeon: Bernarda Caffey, MD;  Location: [place];  Service: Ophthalmology;  Laterality: Right;  . MULTIPLE CYSTO/ LEFT URETERAL STENT EXCHANGES  LAST ONE 10-28-10  . RETROPERITONEAL BX  2007  . REVERSE SHOULDER ARTHROPLASTY Right 01/31/2015   Procedure: RIGHT REVERSE SHOULDER ARTHROPLASTY;  Surgeon: Justice Britain, MD;  Location: [place];  Service: Orthopedics;  Laterality: Right;  . TONSILLECTOMY  CHILD  . TOTAL KNEE ARTHROPLASTY  OCT 2010   RIGHT  . TOTAL KNEE ARTHROPLASTY  JAN 2010   LEFT  . TRANSTHORACIC ECHOCARDIOGRAM  [phone]   MILD SYSTOLIC DYSFUNCTION/ EF 26-20%/ MODERATE DIASTOLIC DYSFUCTION/ MILD MR/ MILD BIATRIAL ENLARGEMENT  . VITRECTOMY 25 GAUGE WITH SCLERAL BUCKLE Right 03/31/2018   Procedure: RIGHT VITRECTOMY 25 GAUGE WITH SCLERAL BUCKLE;  Surgeon: Bernarda Caffey, MD;  Location: [place];  Service: Ophthalmology;  Laterality: Right;    Family History  Problem Relation Age of Onset  . Arthritis Mother   . Heart disease Mother   . Heart attack Mother   . Diabetes Sister   . Hypertension Sister   . Stroke Sister   . Heart attack Sister   . Liver disease Son        r/t Tylenol use and alcoholism  . Colon cancer Neg Hx   . Stomach cancer Neg Hx   . Pancreatic cancer Neg Hx   . Esophageal cancer Neg Hx     Social History   Socioeconomic History  . Marital status: Married    Spouse name: Not on file  . Number of children: Not on file  . Years of education: Not on file  . Highest education level: Not on file  Occupational History  . Not on file  Tobacco Use  . Smoking status: Former Smoker    Packs/day: 2.00    Years: 52.00    Pack years: 104.00    Types: Cigarettes     Quit date: 03/24/2008    Years since quitting: 12.2  . Smokeless tobacco: Never Used  Vaping Use  . Vaping Use: Never used  Substance and Sexual Activity  . Alcohol use: Not Currently    Comment: RARE  . Drug use: No  . Sexual activity: Not Currently  Other Topics Concern  . Not on file  Social History Narrative  . Not on file   Social Determinants of Health   Financial Resource Strain: Medium Risk  . Difficulty of Paying Living Expenses: Somewhat hard  Food Insecurity: No Food Insecurity  . Worried About  Running Out of Food in the Last Year: Never true  . Ran Out of Food in the Last Year: Never true  Transportation Needs: No Transportation Needs  . Lack of Transportation (Medical): No  . Lack of Transportation (Non-Medical): No  Physical Activity: Inactive  . Days of Exercise per Week: 0 days  . Minutes of Exercise per Session: 0 min  Stress: Stress Concern Present  . Feeling of Stress : To some extent  Social Connections: Moderately Isolated  . Frequency of Communication with Friends and Family: More than three times a week  . Frequency of Social Gatherings with Friends and Family: More than three times a week  . Attends Religious Services: Never  . Active Member of Clubs or Organizations: No  . Attends Archivist Meetings: Never  . Marital Status: Married  Human resources officer Violence: Not At Risk  . Fear of Current or Ex-Partner: No  . Emotionally Abused: No  . Physically Abused: No  . Sexually Abused: No    Outpatient Medications Prior to Visit  Medication Sig Dispense Refill  . ALPRAZolam (XANAX) 1 MG tablet TAKE 1 TABLET BY MOUTH 3 TIMES DAILY AS NEEDED. 90 tablet 2  . b complex vitamins capsule Take 2 capsules by mouth daily.     Marland Kitchen buPROPion (WELLBUTRIN XL) 300 MG 24 hr tablet TAKE 1 TABLET BY MOUTH EVERY DAY 90 tablet 3  . Cholecalciferol (VITAMIN D) 2000 UNITS CAPS Take 2,000 Int'l Units by mouth.    . Coenzyme Q10-Fish Oil-Vit E (CO-Q 10 OMEGA-3 FISH  OIL PO) Take 100 mg by mouth daily.     Marland Kitchen diltiazem (CARDIZEM CD) 120 MG 24 hr capsule Take 1 capsule (120 mg total) by mouth daily. 90 capsule 3  . doxycycline (VIBRA-TABS) 100 MG tablet Take 1 tablet (100 mg total) by mouth 2 (two) times daily. 20 tablet 0  . DULoxetine (CYMBALTA) 60 MG capsule Take 60 mg by mouth daily.    Marland Kitchen ELIQUIS 5 MG TABS tablet TAKE 1 TABLET BY MOUTH TWICE A DAY 60 tablet 5  . FLUAD QUADRIVALENT 0.5 ML injection     . fluticasone (FLONASE) 50 MCG/ACT nasal spray instill 2 sprays into each nostril once daily 16 g 6  . furosemide (LASIX) 20 MG tablet Take 1 tablet (20 mg total) by mouth daily. Needs appointment for refills. [phone] 90 tablet 1  . gatifloxacin (ZYMAXID) 0.5 % SOLN Place 1 drop into the right eye in the morning, at noon, in the evening, and at bedtime.    Marland Kitchen loratadine (CLARITIN) 10 MG tablet Take 10 mg by mouth daily.    Marland Kitchen losartan (COZAAR) 25 MG tablet TAKE 1 TABLET BY MOUTH DAILY. 90 tablet 3  . Lysine 500 MG TABS Take 500 mg by mouth daily.    . metoprolol succinate (TOPROL-XL) 25 MG 24 hr tablet Take 1 tablet (25 mg total) by mouth daily. 90 tablet 3  . Multiple Vitamins-Minerals (PRESERVISION AREDS 2 PO) Take 1 capsule by mouth in the morning and at bedtime.    . Pitavastatin Calcium (LIVALO) 2 MG TABS Take 1 tablet (2 mg total) by mouth daily. 30 tablet 11  . valACYclovir (VALTREX) 1000 MG tablet TAKE 2 TABLETS AT ONSET OF COLD SORES AND REPEAT 2 IN 12 HOURS. 30 tablet 1   No facility-administered medications prior to visit.    Allergies  Allergen Reactions  . Chlorhexidine Base Itching    CHG WIPES  . Clindamycin/Lincomycin Other (See Comments)  Caused fire like sensation in her stomach   . Sulfa Antibiotics Other (See Comments)    Severe GI upset  . Penicillins Hives and Rash    DID THE REACTION INVOLVE: Swelling of the face/tongue/throat, SOB, or low BP? No Sudden or severe rash/hives, skin peeling, or the inside of the mouth or  nose? No Did it require medical treatment? No When did it last happen?90s If all above answers are "NO", may proceed with cephalosporin use.     ROS Review of Systems  Constitutional: Negative for chills and fever.  Respiratory: Negative for cough.   Cardiovascular: Negative for chest pain.  Gastrointestinal: Negative for abdominal pain.  Musculoskeletal: Negative for back pain.  Neurological: Negative for headaches.  Psychiatric/Behavioral: Negative for confusion.      Objective:    Physical Exam Cardiovascular:     Rate and Rhythm: Normal rate.  Pulmonary:     Effort: Pulmonary effort is normal.     Breath sounds: Normal breath sounds.     Comments: Chest wall is nontender to palpation Musculoskeletal:     Comments: Left knee reveals scar from previous total knee replacement.  No erythema.  No localized tenderness.  Good range of motion.  She has decent range of motion left shoulder.  No localized tenderness.  Skin:    Comments: She has fairly extensive bruising the left leg anteriorly from just below the knee all the way down toward the ankle.  No hematoma palpated.  No bony tenderness.     BP 130/64 (BP Location: Left Arm, Patient Position: Sitting, Cuff Size: Normal)   Pulse 86   Temp 97.8 F (36.6 C) (Oral)   Wt 193 lb 12.8 oz (87.9 kg)   SpO2 96%   BMI 31.28 kg/m  Wt Readings from Last 3 Encounters:  06/21/20 193 lb 12.8 oz (87.9 kg)  06/05/20 188 lb 6.4 oz (85.5 kg)  05/17/20 192 lb (87.1 kg)     There are no preventive care reminders to display for this patient.  There are no preventive care reminders to display for this patient.  Lab Results  Component Value Date   TSH 2.34 03/12/2020   Lab Results  Component Value Date   WBC 6.5 06/21/2020   HGB 11.9 (L) 06/21/2020   HCT 35.4 (L) 06/21/2020   MCV 91.8 06/21/2020   PLT 194.0 06/21/2020   Lab Results  Component Value Date   NA 143 03/12/2020   K 5.0 03/12/2020   CHLORIDE 105  05/07/2015   CO2 27 03/12/2020   GLUCOSE 125 (H) 03/12/2020   BUN 14 03/12/2020   CREATININE 1.10 (H) 03/12/2020   BILITOT 0.5 03/12/2020   ALKPHOS 80 02/01/2018   AST 18 03/12/2020   ALT 20 03/12/2020   PROT 6.1 03/12/2020   ALBUMIN 3.9 02/01/2018   CALCIUM 9.3 03/12/2020   ANIONGAP 13 04/06/2018   EGFR 42 (L) 05/07/2015   GFR 47.96 (L) 05/31/2015   Lab Results  Component Value Date   CHOL 123 08/23/2019   Lab Results  Component Value Date   HDL 51 08/23/2019   Lab Results  Component Value Date   LDLCALC 57 08/23/2019   Lab Results  Component Value Date   TRIG 76 08/23/2019   Lab Results  Component Value Date   CHOLHDL 2.4 08/23/2019   Lab Results  Component Value Date   HGBA1C 6.7 (A) 04/08/2018      Assessment & Plan:   #1 recent fall with injuries including -Contusion  left leg -Left chest wall pain which is now resolved and suspect was more likely musculoskeletal in nature.  #2 chronic anxiety.  Patient on fairly high-dose alprazolam which she has been on for many years. -We again discussed our concerns with fall risk.  She has been very reluctant to taper back.  We have suggested that she try to start tapering back to half tablet if possible 3 times daily and then hopefully can taper further.  Her motivation for tapering is extremely low.  #3 borderline low hgb by recent labs -recheck CBC along with ferritin and TIBC/iron.  No orders of the defined types were placed in this encounter.   Follow-up: No follow-ups on file.    Carolann Littler, MD

## 2020-06-21 NOTE — Patient Instructions (Signed)

## 2020-06-22 LAB — IRON,TIBC AND FERRITIN PANEL
%SAT: 33 % (calc) (ref 16–45)
Ferritin: 85 ng/mL (ref 16–288)
Iron: 99 ug/dL (ref 45–160)
TIBC: 302 mcg/dL (calc) (ref 250–450)

## 2020-07-13 NOTE — Progress Notes (Signed)
Reviewed and agree with documentation and assessment and plan. K. Veena Guilianna Mckoy , MD   

## 2020-07-16 ENCOUNTER — Other Ambulatory Visit: Payer: Self-pay | Admitting: Cardiology

## 2020-07-19 ENCOUNTER — Other Ambulatory Visit: Payer: Self-pay | Admitting: Cardiology

## 2020-07-24 ENCOUNTER — Other Ambulatory Visit: Payer: Self-pay

## 2020-07-24 ENCOUNTER — Ambulatory Visit: Payer: Medicare Other | Admitting: Podiatry

## 2020-07-24 ENCOUNTER — Encounter: Payer: Self-pay | Admitting: Podiatry

## 2020-07-24 DIAGNOSIS — M79676 Pain in unspecified toe(s): Secondary | ICD-10-CM | POA: Diagnosis not present

## 2020-07-24 DIAGNOSIS — B351 Tinea unguium: Secondary | ICD-10-CM

## 2020-07-24 DIAGNOSIS — D689 Coagulation defect, unspecified: Secondary | ICD-10-CM

## 2020-07-24 DIAGNOSIS — L84 Corns and callosities: Secondary | ICD-10-CM

## 2020-07-29 ENCOUNTER — Other Ambulatory Visit: Payer: Self-pay | Admitting: Cardiology

## 2020-08-01 NOTE — Progress Notes (Signed)
Subjective: [name] is a pleasant 75 y.o. female patient seen today for at-risk foot care with h/o clotting disorder. She also has h/o preulcerative corns of  both feet and painful thick toenails. Pain interferes with ambulation. Aggravating factors include wearing enclosed shoe gear. Pain is relieved with periodic professional debridement.  She is still being worked up for unintentional weight loss.   PCP is Dr. Carolann Littler and last visit was 06/21/2020.  Allergies  Allergen Reactions  . Chlorhexidine Base Itching    CHG WIPES  . Clindamycin/Lincomycin Other (See Comments)    Caused fire like sensation in her stomach   . Sulfa Antibiotics Other (See Comments)    Severe GI upset  . Penicillins Hives and Rash    DID THE REACTION INVOLVE: Swelling of the face/tongue/throat, SOB, or low BP? No Sudden or severe rash/hives, skin peeling, or the inside of the mouth or nose? No Did it require medical treatment? No When did it last happen?90s If all above answers are "NO", may proceed with cephalosporin use.     Objective: Physical Exam  General: [name] is a pleasant 75 y.o. Caucasian female, obese in NAD. AAO x 3.   Vascular:  Capillary refill time to digits immediate b/l. Palpable pedal pulses b/l LE. Pedal hair sparse b/l lower extremities. Lower extremity skin temperature gradient within normal limits. No pain with calf compression b/l. No edema noted b/l lower extremities.   Dermatological:  Pedal skin with normal turgor, texture and tone bilaterally. No open wounds bilaterally. No interdigital macerations bilaterally. Toenails 1-5 b/l elongated, discolored, dystrophic, thickened, crumbly with subungual debris and tenderness to dorsal palpation.   Hyperkeratotic lesion distal tip of digits 2-4 b/l with subdermal hemorrhage. No erythema, no edema, no drainage, no fluctuance noted.  Musculoskeletal:  Normal muscle strength 5/5 to all lower extremity muscle  groups bilaterally. No pain crepitus or joint limitation noted with ROM b/l. Hammertoes noted to the 2-5 bilaterally.  Neurological:  Protective sensation intact 5/5 intact bilaterally with 10g monofilament b/l. Vibratory sensation intact b/l. Clonus negative b/l.  Assessment and Plan:  1. Pain due to onychomycosis of toenail   2. Pre-ulcerative corn or callous   3. Clotting disorder ([place])     -Examined patient. -Patient to continue soft, supportive shoe gear daily. -Toenails 1-5 b/l were debrided in length and girth with sterile nail nippers and dremel without iatrogenic bleeding.  -Patient to report any pedal injuries to medical professional immediately. -Preulcerative lesions pared without incident distal tip of digits 2-4 b/l. -Continue toe crests to afftected digits daily for protection. -Patient/POA to call should there be question/concern in the interim.  Return in about 3 months (around 10/24/2020).  Marzetta Board, DPM

## 2020-08-13 ENCOUNTER — Other Ambulatory Visit: Payer: Self-pay

## 2020-08-13 ENCOUNTER — Encounter: Payer: Self-pay | Admitting: Gastroenterology

## 2020-08-13 ENCOUNTER — Ambulatory Visit (AMBULATORY_SURGERY_CENTER): Payer: Medicare Other | Admitting: Gastroenterology

## 2020-08-13 VITALS — BP 117/51 | HR 77 | Temp 97.6°F | Resp 12 | Ht 66.0 in | Wt 188.0 lb

## 2020-08-13 DIAGNOSIS — K319 Disease of stomach and duodenum, unspecified: Secondary | ICD-10-CM

## 2020-08-13 DIAGNOSIS — K2990 Gastroduodenitis, unspecified, without bleeding: Secondary | ICD-10-CM

## 2020-08-13 DIAGNOSIS — K5730 Diverticulosis of large intestine without perforation or abscess without bleeding: Secondary | ICD-10-CM

## 2020-08-13 DIAGNOSIS — R627 Adult failure to thrive: Secondary | ICD-10-CM

## 2020-08-13 DIAGNOSIS — K2970 Gastritis, unspecified, without bleeding: Secondary | ICD-10-CM

## 2020-08-13 DIAGNOSIS — D122 Benign neoplasm of ascending colon: Secondary | ICD-10-CM

## 2020-08-13 DIAGNOSIS — K449 Diaphragmatic hernia without obstruction or gangrene: Secondary | ICD-10-CM

## 2020-08-13 DIAGNOSIS — R634 Abnormal weight loss: Secondary | ICD-10-CM

## 2020-08-13 DIAGNOSIS — D120 Benign neoplasm of cecum: Secondary | ICD-10-CM

## 2020-08-13 DIAGNOSIS — K648 Other hemorrhoids: Secondary | ICD-10-CM | POA: Diagnosis not present

## 2020-08-13 DIAGNOSIS — K2100 Gastro-esophageal reflux disease with esophagitis, without bleeding: Secondary | ICD-10-CM

## 2020-08-13 MED ORDER — PANTOPRAZOLE SODIUM 40 MG PO TBEC: 40.0000 mg | DELAYED_RELEASE_TABLET | Freq: Every day | ORAL | 3 refills | Status: DC

## 2020-08-13 MED ORDER — SODIUM CHLORIDE 0.9 % IV SOLN: 500.0000 mL | Freq: Once | INTRAVENOUS | Status: DC

## 2020-08-13 NOTE — Op Note (Addendum)
[facility] Patient Name: [name] Procedure Date: 08/13/2020 2:29 PM MRN: [id number] Endoscopist: Mauri Pole , MD Age: 75 Referring MD:  Date of Birth: Nov 08, 1945 Gender: Female Account #: [id number] Procedure:                Upper GI endoscopy Indications:              Failure to thrive, Weight loss Medicines:                Monitored Anesthesia Care Procedure:                Pre-Anesthesia Assessment:                           - Prior to the procedure, a History and Physical                            was performed, and patient medications and                            allergies were reviewed. The patient's tolerance of                            previous anesthesia was also reviewed. The risks                            and benefits of the procedure and the sedation                            options and risks were discussed with the patient.                            All questions were answered, and informed consent                            was obtained. Prior Anticoagulants: The patient                            last took Eliquis (apixaban) 2 days prior to the                            procedure. ASA Grade Assessment: III - A patient                            with severe systemic disease. After reviewing the                            risks and benefits, the patient was deemed in                            satisfactory condition to undergo the procedure.                           After obtaining informed consent, the endoscope was  passed under direct vision. Throughout the                            procedure, the patient's blood pressure, pulse, and                            oxygen saturations were monitored continuously. The                            Endoscope was introduced through the mouth, and                            advanced to the second part of duodenum. The upper                            GI endoscopy  was accomplished without difficulty.                            The patient tolerated the procedure well. Scope In: 3:25:28 PM Scope Out: 3:39:33 PM Scope Withdrawal Time: 0 hours 7 minutes 0 seconds  Total Procedure Duration: 0 hours 14 minutes 5 seconds  Findings:                 LA Grade B (one or more mucosal breaks greater than                            5 mm, not extending between the tops of two mucosal                            folds) esophagitis with no bleeding was found 34 to                            36 cm from the incisors.                           The gastroesophageal flap valve was visualized                            endoscopically and classified as Hill Grade III                            (minimal fold, loose to endoscope, hiatal hernia                            likely).                           A small hiatal hernia was present.                           Patchy mild inflammation characterized by adherent                            blood, congestion (edema), erosions, erythema and  mucus was found in the entire examined stomach.                            Biopsies were taken with a cold forceps for                            Helicobacter pylori testing.                           The examined duodenum was normal. Complications:            No immediate complications. Estimated Blood Loss:     Estimated blood loss was minimal. Impression:               - LA Grade B reflux esophagitis with no bleeding.                           - Gastroesophageal flap valve classified as Hill                            Grade III (minimal fold, loose to endoscope, hiatal                            hernia likely).                           - Small hiatal hernia.                           - Gastritis. Biopsied.                           - Normal examined duodenum. Recommendation:           - Patient has a contact number available for                             emergencies. The signs and symptoms of potential                            delayed complications were discussed with the                            patient. Return to normal activities tomorrow.                            Written discharge instructions were provided to the                            patient.                           - Resume previous diet.                           - Continue present medications.                           -  Resume Eliquis (apixaban) at prior dose tomorrow.                            Refer to managing physician for further adjustment                            of therapy.                           - Await pathology results.                           - Avoid NSAID's                           - Pantoprazole 40mg  daily X 3 months. Rx for 90 days Mauri Pole, MD 08/13/2020 3:50:56 PM This report has been signed electronically.

## 2020-08-13 NOTE — Progress Notes (Signed)
Report to PACU, RN, vss, BBS= Clear.  

## 2020-08-13 NOTE — Progress Notes (Signed)
Called to room to assist during endoscopic procedure.  Patient ID and intended procedure confirmed with present staff. Received instructions for my participation in the procedure from the performing physician.  

## 2020-08-13 NOTE — Patient Instructions (Signed)
Handouts given:  Hiatal hernia, polyps, diverticulosis, gastritis Resume previous diet Continue current medications Start pantoprazole 40mg  by mouth daily( take only with water and at least 30 min before eating Resume Eliquis at prior dose tomorrow Await pathology results  YOU HAD AN ENDOSCOPIC PROCEDURE TODAY AT [place]:   Refer to the procedure report that was given to you for any specific questions about what was found during the examination.  If the procedure report does not answer your questions, please call your gastroenterologist to clarify.  If you requested that your care partner not be given the details of your procedure findings, then the procedure report has been included in a sealed envelope for you to review at your convenience later.  YOU SHOULD EXPECT: Some feelings of bloating in the abdomen. Passage of more gas than usual.  Walking can help get rid of the air that was put into your GI tract during the procedure and reduce the bloating. If you had a lower endoscopy (such as a colonoscopy or flexible sigmoidoscopy) you may notice spotting of blood in your stool or on the toilet paper. If you underwent a bowel prep for your procedure, you may not have a normal bowel movement for a few days.  Please Note:  You might notice some irritation and congestion in your nose or some drainage.  This is from the oxygen used during your procedure.  There is no need for concern and it should clear up in a day or so.  SYMPTOMS TO REPORT IMMEDIATELY:   Following lower endoscopy (colonoscopy or flexible sigmoidoscopy):  Excessive amounts of blood in the stool  Significant tenderness or worsening of abdominal pains  Swelling of the abdomen that is new, acute  Fever of 100F or higher   Following upper endoscopy (EGD)  Vomiting of blood or coffee ground material  New chest pain or pain under the shoulder blades  Painful or persistently difficult swallowing  New shortness  of breath  Fever of 100F or higher  Black, tarry-looking stools  For urgent or emergent issues, a gastroenterologist can be reached at any hour by calling [phone]. Do not use MyChart messaging for urgent concerns.   DIET:  We do recommend a small meal at first, but then you may proceed to your regular diet.  Drink plenty of fluids but you should avoid alcoholic beverages for 24 hours.  ACTIVITY:  You should plan to take it easy for the rest of today and you should NOT DRIVE or use heavy machinery until tomorrow (because of the sedation medicines used during the test).    FOLLOW UP: Our staff will call the number listed on your records 48-72 hours following your procedure to check on you and address any questions or concerns that you may have regarding the information given to you following your procedure. If we do not reach you, we will leave a message.  We will attempt to reach you two times.  During this call, we will ask if you have developed any symptoms of COVID 19. If you develop any symptoms (ie: fever, flu-like symptoms, shortness of breath, cough etc.) before then, please call [phone].  If you test positive for Covid 19 in the 2 weeks post procedure, please call and report this information to [place].    If any biopsies were taken you will be contacted by phone or by letter within the next 1-3 weeks.  Please call us at [phone] if you have not heard  about the biopsies in 3 weeks.   SIGNATURES/CONFIDENTIALITY: You and/or your care partner have signed paperwork which will be entered into your electronic medical record.  These signatures attest to the fact that that the information above on your After Visit Summary has been reviewed and is understood.  Full responsibility of the confidentiality of this discharge information lies with you and/or your care-partner.

## 2020-08-13 NOTE — Op Note (Addendum)
[facility] Patient Name: [name] Procedure Date: 08/13/2020 2:28 PM MRN: [id number] Endoscopist: Mauri Pole , MD Age: 75 Referring MD:  Date of Birth: 05/18/1945 Gender: Female Account #: [id number] Procedure:                Colonoscopy Indications:              Weight loss, failure to thrive Medicines:                Monitored Anesthesia Care Procedure:                Pre-Anesthesia Assessment:                           - Prior to the procedure, a History and Physical                            was performed, and patient medications and                            allergies were reviewed. The patient's tolerance of                            previous anesthesia was also reviewed. The risks                            and benefits of the procedure and the sedation                            options and risks were discussed with the patient.                            All questions were answered, and informed consent                            was obtained. Prior Anticoagulants: The patient                            last took Eliquis (apixaban) 2 days prior to the                            procedure. ASA Grade Assessment: III - A patient                            with severe systemic disease. After reviewing the                            risks and benefits, the patient was deemed in                            satisfactory condition to undergo the procedure.                           After obtaining informed consent, the colonoscope  was passed under direct vision. Throughout the                            procedure, the patient's blood pressure, pulse, and                            oxygen saturations were monitored continuously. The                            Olympus [id number]-[id number] (#[id number]) Colonoscope was                            introduced through the anus and advanced to the the                            cecum, identified  by appendiceal orifice and                            ileocecal valve. The colonoscopy was performed                            without difficulty. The patient tolerated the                            procedure well. The quality of the bowel                            preparation was good. The ileocecal valve,                            appendiceal orifice, and rectum were photographed. Findings:                 The perianal and digital rectal examinations were                            normal.                           A less than 1 mm polyp was found in the cecum. The                            polyp was sessile. The polyp was removed with a                            cold biopsy forceps. Resection and retrieval were                            complete.                           A 10 mm polyp was found in the ascending colon. The                            polyp was sessile. The polyp was  removed with a                            cold snare. Resection and retrieval were complete.                           Scattered small and large-mouthed diverticula were                            found in the sigmoid colon and descending colon.                           Non-bleeding external and internal hemorrhoids were                            found during retroflexion. The hemorrhoids were                            medium-sized. Complications:            No immediate complications. Estimated Blood Loss:     Estimated blood loss was minimal. Impression:               - One less than 1 mm polyp in the cecum, removed                            with a cold biopsy forceps. Resected and retrieved.                           - One 10 mm polyp in the ascending colon, removed                            with a cold snare. Resected and retrieved.                           - Diverticulosis in the sigmoid colon and in the                            descending colon.                           - Non-bleeding  external and internal hemorrhoids. Recommendation:           - Patient has a contact number available for                            emergencies. The signs and symptoms of potential                            delayed complications were discussed with the                            patient. Return to normal activities tomorrow.                            Written discharge  instructions were provided to the                            patient.                           - Resume previous diet.                           - Continue present medications.                           - Await pathology results.                           - No repeat colonoscopy due to age. Mauri Pole, MD 08/13/2020 3:50:33 PM This report has been signed electronically.

## 2020-08-14 ENCOUNTER — Telehealth: Payer: Self-pay | Admitting: Family Medicine

## 2020-08-14 ENCOUNTER — Telehealth: Payer: Self-pay | Admitting: Gastroenterology

## 2020-08-14 NOTE — Telephone Encounter (Signed)
FYI Dr Nandigam 

## 2020-08-14 NOTE — Telephone Encounter (Signed)
Patient is calling and stated that her gastroenterologist gave her a prescription for pantoprazole and wanted to ask the provider if medication was ok to take given the possible side affects of kidney damage, please advise. CB is [phone]

## 2020-08-15 ENCOUNTER — Telehealth: Payer: Self-pay | Admitting: *Deleted

## 2020-08-15 ENCOUNTER — Telehealth: Payer: Self-pay

## 2020-08-15 NOTE — Telephone Encounter (Signed)
Ok

## 2020-08-15 NOTE — Telephone Encounter (Signed)
Left message on answering machine. 

## 2020-08-15 NOTE — Telephone Encounter (Signed)
ATC, unable to leave a message.  

## 2020-08-15 NOTE — Telephone Encounter (Signed)
No answer for post procedure call back. Left VM. 

## 2020-08-16 NOTE — Telephone Encounter (Signed)
ATC, unable to leave a voice mail.  

## 2020-08-20 ENCOUNTER — Encounter: Payer: Self-pay | Admitting: Cardiology

## 2020-08-20 ENCOUNTER — Other Ambulatory Visit: Payer: Self-pay

## 2020-08-20 ENCOUNTER — Ambulatory Visit: Payer: Medicare Other | Admitting: Cardiology

## 2020-08-20 VITALS — BP 110/68 | HR 60 | Ht 66.0 in | Wt 189.2 lb

## 2020-08-20 DIAGNOSIS — I2510 Atherosclerotic heart disease of native coronary artery without angina pectoris: Secondary | ICD-10-CM | POA: Diagnosis not present

## 2020-08-20 DIAGNOSIS — I10 Essential (primary) hypertension: Secondary | ICD-10-CM | POA: Diagnosis not present

## 2020-08-20 DIAGNOSIS — I4821 Permanent atrial fibrillation: Secondary | ICD-10-CM

## 2020-08-20 DIAGNOSIS — I350 Nonrheumatic aortic (valve) stenosis: Secondary | ICD-10-CM | POA: Diagnosis not present

## 2020-08-20 DIAGNOSIS — E7800 Pure hypercholesterolemia, unspecified: Secondary | ICD-10-CM

## 2020-08-20 DIAGNOSIS — I420 Dilated cardiomyopathy: Secondary | ICD-10-CM | POA: Diagnosis not present

## 2020-08-20 DIAGNOSIS — I5032 Chronic diastolic (congestive) heart failure: Secondary | ICD-10-CM

## 2020-08-20 DIAGNOSIS — I4819 Other persistent atrial fibrillation: Secondary | ICD-10-CM | POA: Diagnosis not present

## 2020-08-20 MED ORDER — LOSARTAN POTASSIUM 25 MG PO TABS: ORAL_TABLET | ORAL | 3 refills | Status: DC

## 2020-08-20 MED ORDER — FUROSEMIDE 20 MG PO TABS: 20.0000 mg | ORAL_TABLET | Freq: Every day | ORAL | 3 refills | Status: DC

## 2020-08-20 MED ORDER — LIVALO 2 MG PO TABS: 1.0000 | ORAL_TABLET | Freq: Every day | ORAL | 3 refills | Status: DC

## 2020-08-20 MED ORDER — ELIQUIS 5 MG PO TABS: 1.0000 | ORAL_TABLET | Freq: Two times a day (BID) | ORAL | 5 refills | Status: DC

## 2020-08-20 MED ORDER — DILTIAZEM HCL ER COATED BEADS 120 MG PO CP24: 120.0000 mg | ORAL_CAPSULE | Freq: Every day | ORAL | 3 refills | Status: DC

## 2020-08-20 MED ORDER — METOPROLOL SUCCINATE ER 25 MG PO TB24: 25.0000 mg | ORAL_TABLET | Freq: Every day | ORAL | 3 refills | Status: DC

## 2020-08-20 NOTE — Patient Instructions (Addendum)
Medication Instructions:  Your physician recommends that you continue on your current medications as directed. Please refer to the Current Medication list given to you today.  *If you need a refill on your cardiac medications before your next appointment, please call your pharmacy*  Lab Work: Fasting lipids and ALT on 6/17 between 7:30am and 4:30pm If you have labs (blood work) drawn today and your tests are completely normal, you will receive your results only by: Marland Kitchen MyChart Message (if you have MyChart) OR . A paper copy in the mail If you have any lab test that is abnormal or we need to change your treatment, we will call you to review the results.  Follow-Up: At [facility], you and your health needs are our priority.  As part of our continuing mission to provide you with exceptional heart care, we have created designated Provider Care Teams.  These Care Teams include your primary Cardiologist (physician) and Advanced Practice Providers (APPs -  Physician Assistants and Nurse Practitioners) who all work together to provide you with the care you need, when you need it.  Your next appointment:   1 year(s)  The format for your next appointment:   In Person  Provider:   You may see Fransico Him, MD or one of the following Advanced Practice Providers on your designated Care Team:    Melina Copa, PA-C  Ermalinda Barrios, PA-C

## 2020-08-20 NOTE — Telephone Encounter (Signed)
ATC, unable to leave a voice mail.  

## 2020-08-20 NOTE — Addendum Note (Signed)
Addended by: Antonieta Iba on: 08/20/2020 01:38 PM   Modules accepted: Orders

## 2020-08-20 NOTE — Progress Notes (Signed)
Cardiology Office Note:    Date:  08/20/2020   ID:  [name], DOB 05-Mar-1946, MRN [id number]  PCP:  [name], MD  Cardiologist:  Fransico Him, MD    Referring MD: [name], MD   Chief Complaint  Patient presents with  . Follow-up    Coronary artery calcifications, atrial fi, AS, HTN, CHF, HLD    History of Present Illness:    [name] is a 75 y.o. female with a hx of retroperitoneal fibrosis and non-Hodgkin's lymphoma as well as prior DVT. She has a history of coronary artery calcifications with a moderate calcium score of 161 (putting her at a high risk percentile for her age) and a possible moderate mid LAD stenosis (though some degradation of images by respiratory artifact). Echoshowed mildly reducedLV systolic function (reported as mildly decreased on myoview),EF 45-50% with moderate diastolic dysfunction. The patient opted for medical therapy.  She was diagnosed with paroxysmal atrial fibrillationandwas started on warfarin and diltiazem CD. Last echo in 8/15 showed EF 50-55%, moderate LV hypertrophy, probably mild mitral stenosis, normal RV size and systolic function. She was initially on dronedarone to try to maintain NSR, but developed persistent atrial fibrillation.She was changed toamiodarone and underwentcardioversion to NSR in 4/17but went back intoatrial fibrillation. The decision was made to pursue rate control and her amio was stopped. She also has a history of chronic diastolic CHF triggered by afib in the past.   She is here today for followup and is doing well.  She has chronic DOE which is stable and she attributes to age and obesity.  She denies any chest pain or pressure, PND, orthopnea, LE edema, dizziness, palpitations or syncope. She is compliant with her meds and is tolerating meds with no SE.      Past Medical History:  Diagnosis Date  . ABDOMINAL PAIN RIGHT UPPER QUADRANT 11/08/2008  . ALLERGIC RHINITIS  08/01/2009  . Anxiety   . Cataract   . Complication of anesthesia 01/31/2015   ? doesn't remember anything after being taken to pre surgery  . Coronary artery calcification seen on CAT scan 05/30/2009   moderate risk coronary calcium score by chest CT  . DDD (degenerative disc disease), lumbar   . DEPRESSION 06/13/2008  . DYSLIPIDEMIA 02/27/2009  . DYSPNEA 02/27/2009  . Dysrhythmia    Afib  . Frequency of urination   . GERD (gastroesophageal reflux disease)    "years ago"  . Head injury 2016   did not loose consciousness  . History of Bell's palsy   . History of DVT of lower extremity 2006-- CHRONIC COUMADIN THERAPY  . History of hiatal hernia   . History of Lyme disease   . 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 Hypertension   . Lyme disease   . NON-HODGKIN'S LYMPHOMA, HX OF dx  2005--  chemoradiation completed 2006--  no recurrence   onocologist- dr odogwu--   . OSTEOARTHRITIS, MODERATE 04/07/2010  . Permanent atrial fibrillation (HCC)    On chronic anti-coagulation with warfarin  . Pneumonia    x 3 - last time 02/2018  . Retroperitoneal fibrosis   . Stroke [facility])    ? - [place]- "you have had 3 strokes"    Past Surgical History:  Procedure Laterality Date  . BREAST MASS EXCISION     BENIGN-- RIGHT BREAST  . CARDIOVASCULAR STRESS TEST  04-04-2009-- PER PT ASYMPTOMATIC-- MEDICAL MANAGEMENT   STUDY WAS EQUIVOCAL/ EF 49%/ GLOBAL HYPOKINESIS/ APPEARS TO  BE A MILD ANTERIOR PERFUSION DEFECT COULD REPRESENT ISCHEMIA BUT DUE TO  ACTIVITY WORSE IN STRESS THAN AT REST POSS. THE DEFECT WAS ARTIFACTUAL  . CARDIOVERSION N/A 07/11/2015   Procedure: CARDIOVERSION;  Surgeon: Larey Dresser, MD;  Location: [place];  Service: Cardiovascular;  Laterality: N/A;  . CT ANGIOGRAM  FEB 2011   CALCIUM SCORE 161/ POSS. MODERATE MID LAD STENOSIS  . CYSTOSCOPY W/ RETROGRADES  04/28/2011   Procedure: CYSTOSCOPY WITH RETROGRADE PYELOGRAM;  Surgeon: Fredricka Bonine, MD;  Location: [facility];  Service: Urology;  Laterality: Left;  . CYSTOSCOPY W/ URETERAL STENT REMOVAL  04/28/2011   Procedure: CYSTOSCOPY WITH STENT REMOVAL;  Surgeon: Fredricka Bonine, MD;  Location: [facility];  Service: Urology;;  . EXPLORATORY LAPAROTOMY  2005   LYMPHOMA  . EYE SURGERY Bilateral    cataract   . GAS INSERTION Right 03/31/2018   Procedure: RIGHT EYE INSERTION OF GAS C3F8;  Surgeon: Bernarda Caffey, MD;  Location: [place];  Service: Ophthalmology;  Laterality: Right;  . KNEE ARTHROSCOPY  2005   RIGHT  . LASER PHOTO ABLATION Right 03/31/2018   Procedure: RIGHT EYE LASER PHOTO ABLATION;  Surgeon: Bernarda Caffey, MD;  Location: [place];  Service: Ophthalmology;  Laterality: Right;  . MULTIPLE CYSTO/ LEFT URETERAL STENT EXCHANGES  LAST ONE 10-28-10  . RETROPERITONEAL BX  2007  . REVERSE SHOULDER ARTHROPLASTY Right 01/31/2015   Procedure: RIGHT REVERSE SHOULDER ARTHROPLASTY;  Surgeon: Justice Britain, MD;  Location: [place];  Service: Orthopedics;  Laterality: Right;  . TONSILLECTOMY  CHILD  . TOTAL KNEE ARTHROPLASTY  OCT 2010   RIGHT  . TOTAL KNEE ARTHROPLASTY  JAN 2010   LEFT  . TRANSTHORACIC ECHOCARDIOGRAM  [id number]   MILD SYSTOLIC DYSFUNCTION/ EF 94-17%/ MODERATE DIASTOLIC DYSFUCTION/ MILD MR/ MILD BIATRIAL ENLARGEMENT  . VITRECTOMY 25 GAUGE WITH SCLERAL BUCKLE Right 03/31/2018   Procedure: RIGHT VITRECTOMY 25 GAUGE WITH SCLERAL BUCKLE;  Surgeon: Bernarda Caffey, MD;  Location: [place];  Service: Ophthalmology;  Laterality: Right;    Current Medications: Current Meds  Medication Sig  . ALPRAZolam (XANAX) 1 MG tablet TAKE 1 TABLET BY MOUTH 3 TIMES DAILY AS NEEDED.  Marland Kitchen b complex vitamins capsule Take 2 capsules by mouth daily.   Marland Kitchen buPROPion (WELLBUTRIN XL) 300 MG 24 hr tablet TAKE 1 TABLET BY MOUTH EVERY DAY  . chlorhexidine (PERIDEX) 0.12 % solution SMARTSIG:By Mouth  . Cholecalciferol (VITAMIN D) 2000 UNITS CAPS Take 2,000 Int'l Units  by mouth.  . Coenzyme Q10-Fish Oil-Vit E (CO-Q 10 OMEGA-3 FISH OIL PO) Take 100 mg by mouth daily.   Marland Kitchen diltiazem (CARDIZEM CD) 120 MG 24 hr capsule Take 1 capsule (120 mg total) by mouth daily. Pt needs to keep upcoming appt in May for further refills  . DULoxetine (CYMBALTA) 60 MG capsule Take 60 mg by mouth daily.  Marland Kitchen ELIQUIS 5 MG TABS tablet TAKE 1 TABLET BY MOUTH TWICE A DAY  . FLUAD QUADRIVALENT 0.5 ML injection   . furosemide (LASIX) 20 MG tablet Take 1 tablet (20 mg total) by mouth daily. Needs appointment for refills. [phone]  . gatifloxacin (ZYMAXID) 0.5 % SOLN Place 1 drop into the right eye in the morning, at noon, in the evening, and at bedtime.  Marland Kitchen LIVALO 2 MG TABS TAKE 1 TABLET BY MOUTH EVERY DAY  . loratadine (CLARITIN) 10 MG tablet Take 10 mg by mouth daily.  Marland Kitchen losartan (COZAAR) 25 MG tablet TAKE 1 TABLET BY MOUTH DAILY.  Marland Kitchen  Lysine 500 MG TABS Take 500 mg by mouth daily.  . metoprolol succinate (TOPROL-XL) 25 MG 24 hr tablet Take 1 tablet (25 mg total) by mouth daily.  . Multiple Vitamins-Minerals (PRESERVISION AREDS 2 PO) Take 1 capsule by mouth in the morning and at bedtime.  . pantoprazole (PROTONIX) 40 MG tablet Take 1 tablet (40 mg total) by mouth daily.  . valACYclovir (VALTREX) 1000 MG tablet TAKE 2 TABLETS AT ONSET OF COLD SORES AND REPEAT 2 IN 12 HOURS.     Allergies:   Chlorhexidine base, Clindamycin/lincomycin, Sulfa antibiotics, and Penicillins   Social History   Socioeconomic History  . Marital status: Married    Spouse name: Not on file  . Number of children: Not on file  . Years of education: Not on file  . Highest education level: Not on file  Occupational History  . Not on file  Tobacco Use  . Smoking status: Former Smoker    Packs/day: 2.00    Years: 52.00    Pack years: 104.00    Types: Cigarettes    Quit date: 03/24/2008    Years since quitting: 12.4  . Smokeless tobacco: Never Used  Vaping Use  . Vaping Use: Never used  Substance and  Sexual Activity  . Alcohol use: Not Currently    Comment: RARE  . Drug use: No  . Sexual activity: Not Currently  Other Topics Concern  . Not on file  Social History Narrative  . Not on file   Social Determinants of Health   Financial Resource Strain: Medium Risk  . Difficulty of Paying Living Expenses: Somewhat hard  Food Insecurity: No Food Insecurity  . Worried About Charity fundraiser in the Last Year: Never true  . Ran Out of Food in the Last Year: Never true  Transportation Needs: No Transportation Needs  . Lack of Transportation (Medical): No  . Lack of Transportation (Non-Medical): No  Physical Activity: Inactive  . Days of Exercise per Week: 0 days  . Minutes of Exercise per Session: 0 min  Stress: Stress Concern Present  . Feeling of Stress : To some extent  Social Connections: Moderately Isolated  . Frequency of Communication with Friends and Family: More than three times a week  . Frequency of Social Gatherings with Friends and Family: More than three times a week  . Attends Religious Services: Never  . Active Member of Clubs or Organizations: No  . Attends Archivist Meetings: Never  . Marital Status: Married     Family History: The patient's family history includes Arthritis in her mother; Diabetes in her sister; Heart attack in her mother and sister; Heart disease in her mother; Hypertension in her sister; Liver disease in her son; Stroke in her sister. There is no history of Colon cancer, Stomach cancer, Pancreatic cancer, Esophageal cancer, or Rectal cancer.  ROS:   Please see the history of present illness.    Review of Systems  Musculoskeletal: Negative for muscle cramps.    All other systems reviewed and negative.   EKGs/Labs/Other Studies Reviewed:    The following studies were reviewed today: none  EKG:  EKG is  ordered today.  The ekg ordered today demonstrates atrial fibrillation with CVR Recent Labs: 03/12/2020: ALT 20; BUN 14;  Creat 1.10; Potassium 5.0; Sodium 143; TSH 2.34 06/21/2020: Hemoglobin 11.9; Platelets 194.0   Recent Lipid Panel    Component Value Date/Time   CHOL 123 08/23/2019 1353   TRIG 76 08/23/2019 1353  HDL 51 08/23/2019 1353   CHOLHDL 2.4 08/23/2019 1353   CHOLHDL 2.2 09/13/2015 1013   VLDL 17 09/13/2015 1013   LDLCALC 57 08/23/2019 1353    Physical Exam:    VS:  BP 110/68   Pulse 60   Ht 5\' 6"  (1.676 m)   Wt 189 lb 3.2 oz (85.8 kg)   BMI 30.54 kg/m     Wt Readings from Last 3 Encounters:  08/20/20 189 lb 3.2 oz (85.8 kg)  08/13/20 188 lb (85.3 kg)  06/21/20 193 lb 12.8 oz (87.9 kg)    GEN: Well nourished, well developed in no acute distress HEENT: Normal NECK: No JVD; No carotid bruits LYMPHATICS: No lymphadenopathy CARDIAC:irregularly irregular, no murmurs, rubs, gallops RESPIRATORY:  Clear to auscultation without rales, wheezing or rhonchi  ABDOMEN: Soft, non-tender, non-distended MUSCULOSKELETAL:  No edema; No deformity  SKIN: Warm and dry NEUROLOGIC:  Alert and oriented x 3 PSYCHIATRIC:  Normal affect    ASSESSMENT:    1. Coronary artery calcification seen on CAT scan   2. Dilated cardiomyopathy ([place])   3. Permanent atrial fibrillation ([place])   4. Nonrheumatic aortic valve stenosis   5. Essential hypertension   6. Chronic diastolic CHF (congestive heart failure) ([place])   7. Pure hypercholesterolemia    PLAN:    In order of problems listed above:  1.  Coronary artery calcification seen on CAT scan -she denies any anginal symptoms -Continue prescription drug management with Toprol XL 25mg  daily and Livalo 2mg  daily -no ASA due to Eliquis  2.  Dilated cardiomyopathy  -EF was 50-55% on echo 2018  3.  Permanent atrial fibrillation  -she remains in atrial fibrillation with CVR on exam today with no compliants of palpitations -Continue prescription drug management with Toprol XL 25mg  daily, Cardizem CD 120mg  daily and Eliquis 5mg  BID -she denies any bleeding  problems on DOAC -I have personally reviewed and interpreted outside labs performed by patient's PCP which showed Hbg 11.9and SCr 1.1 in April 2022  4.  Mild aortic stenosis  -mild by echo 08/2019 with mean AVG 40mmHg -she has a repeat echo pending in 1 month  5.  Hypertension  -BP is adequately controlled on exam today -Continue prescription drug management with Toprol XL 25mg  daily and Losartan 25mg  daily -I have personally reviewed and interpreted outside labs performed by patient's PCP which showed SCR 1.1 and K+ 5 in Dec 2021  6.  Chronic diastolic CHF - -she does not appear volume overloaded on exam today -she has chronic DOE which is stable on diuretics -Continue prescription drug management with Lasix 20mg  daily  7.  Hyperlipidemia  -LDL goal is less than 70.   -check FLP and ALT -continue Livalo 2mg  daily    Medication Adjustments/Labs and Tests Ordered: Current medicines are reviewed at length with the patient today.  Concerns regarding medicines are outlined above.  Orders Placed This Encounter  Procedures  . EKG 12-Lead   No orders of the defined types were placed in this encounter.   Signed, Fransico Him, MD  08/20/2020 1:26 PM    [facility]

## 2020-08-22 ENCOUNTER — Encounter: Payer: Self-pay | Admitting: Gastroenterology

## 2020-08-22 NOTE — Telephone Encounter (Signed)
ATC, unable to leave a message. Unable to reach the patient. Message will be closed.

## 2020-09-03 DIAGNOSIS — H04123 Dry eye syndrome of bilateral lacrimal glands: Secondary | ICD-10-CM | POA: Diagnosis not present

## 2020-09-03 DIAGNOSIS — H524 Presbyopia: Secondary | ICD-10-CM | POA: Diagnosis not present

## 2020-09-03 DIAGNOSIS — Z961 Presence of intraocular lens: Secondary | ICD-10-CM | POA: Diagnosis not present

## 2020-09-03 DIAGNOSIS — H26492 Other secondary cataract, left eye: Secondary | ICD-10-CM | POA: Diagnosis not present

## 2020-09-03 DIAGNOSIS — H353132 Nonexudative age-related macular degeneration, bilateral, intermediate dry stage: Secondary | ICD-10-CM | POA: Diagnosis not present

## 2020-09-06 ENCOUNTER — Other Ambulatory Visit: Payer: Self-pay

## 2020-09-06 ENCOUNTER — Ambulatory Visit (HOSPITAL_COMMUNITY): Payer: Medicare Other | Attending: Cardiology

## 2020-09-06 ENCOUNTER — Other Ambulatory Visit: Payer: Medicare Other | Admitting: *Deleted

## 2020-09-06 DIAGNOSIS — I5032 Chronic diastolic (congestive) heart failure: Secondary | ICD-10-CM | POA: Insufficient documentation

## 2020-09-06 DIAGNOSIS — I420 Dilated cardiomyopathy: Secondary | ICD-10-CM | POA: Diagnosis not present

## 2020-09-06 DIAGNOSIS — E7800 Pure hypercholesterolemia, unspecified: Secondary | ICD-10-CM

## 2020-09-06 LAB — ECHOCARDIOGRAM COMPLETE
AR max vel: 0.85 cm2
AV Area VTI: 0.88 cm2
AV Area mean vel: 0.9 cm2
AV Mean grad: 8 mmHg
AV Peak grad: 16.8 mmHg
Ao pk vel: 2.05 m/s
Calc EF: 51.4 %
S' Lateral: 3.8 cm
Single Plane A2C EF: 53.2 %
Single Plane A4C EF: 51.6 %

## 2020-09-06 LAB — LIPID PANEL
Chol/HDL Ratio: 2.2 ratio (ref 0.0–4.4)
Cholesterol, Total: 136 mg/dL (ref 100–199)
HDL: 61 mg/dL (ref >39)
LDL Chol Calc (NIH): 60 mg/dL (ref 0–99)
Triglycerides: 76 mg/dL (ref 0–149)
VLDL Cholesterol Cal: 15 mg/dL (ref 5–40)

## 2020-09-06 LAB — ALT: ALT: 20 IU/L (ref 0–32)

## 2020-09-08 ENCOUNTER — Encounter: Payer: Self-pay | Admitting: Cardiology

## 2020-09-08 DIAGNOSIS — I350 Nonrheumatic aortic (valve) stenosis: Secondary | ICD-10-CM | POA: Insufficient documentation

## 2020-09-11 ENCOUNTER — Other Ambulatory Visit: Payer: Self-pay | Admitting: Family Medicine

## 2020-09-11 NOTE — Telephone Encounter (Signed)
Last filled 06/17/2020 Last OV 06/21/2020 (acute visit) Due for a physical.  Ok to fill?

## 2020-09-13 NOTE — Telephone Encounter (Signed)
Patient is needing a refill on ALPRAZolam (XANAX) 1 MG tablet she doesn't want to run out over the weekend.     [id number] - [place], [place] Phone:  [phone]  Fax:  [phone]

## 2020-09-24 DIAGNOSIS — H26492 Other secondary cataract, left eye: Secondary | ICD-10-CM | POA: Diagnosis not present

## 2020-09-26 ENCOUNTER — Telehealth: Payer: Self-pay | Admitting: Cardiology

## 2020-09-26 NOTE — Telephone Encounter (Signed)
Patient returning call for echo results. 

## 2020-09-26 NOTE — Telephone Encounter (Signed)
Angela Margarita, MD  09/08/2020  2:30 PM EDT      Echo read out as mmildly reduced LVF with EF 45-50% but I have personally reviewed the images and feel there is no change in EF which appears 50-55%.  THere is mild RV enlargement, enlarged of both atria and mildly leaky MV andmild AS - repeat echo in 1 year for AS   The patient has been notified of the result and verbalized understanding.  All questions (if any) were answered. Antonieta Iba, RN 09/26/2020 12:14 PM

## 2020-10-21 ENCOUNTER — Other Ambulatory Visit: Payer: Self-pay

## 2020-10-21 ENCOUNTER — Encounter: Payer: Self-pay | Admitting: Podiatry

## 2020-10-21 ENCOUNTER — Ambulatory Visit: Payer: Medicare Other | Admitting: Podiatry

## 2020-10-21 DIAGNOSIS — L97521 Non-pressure chronic ulcer of other part of left foot limited to breakdown of skin: Secondary | ICD-10-CM

## 2020-10-21 DIAGNOSIS — B351 Tinea unguium: Secondary | ICD-10-CM

## 2020-10-21 DIAGNOSIS — M79676 Pain in unspecified toe(s): Secondary | ICD-10-CM

## 2020-10-22 NOTE — Progress Notes (Signed)
  Subjective:  Patient ID: [name], female    DOB: 1945/11/16,  MRN: [id number]  [name] presents to clinic today for corn(s) b/l feet and painful thick toenails that are difficult to trim. Painful toenails interfere with ambulation. Aggravating factors include wearing enclosed shoe gear. Pain is relieved with periodic professional debridement. Painful corns are aggravated when weightbearing when wearing enclosed shoe gear. Pain is relieved with periodic professional debridement.  She states most of her toes feel better. She states she wears house slippers mostly at home now.   PCP is [name], MD , and last visit was 06/21/2020.  Allergies  Allergen Reactions   Chlorhexidine Base Itching    CHG WIPES   Clindamycin/Lincomycin Other (See Comments)    Caused fire like sensation in her stomach    Sulfa Antibiotics Other (See Comments)    Severe GI upset   Penicillins Hives and Rash    DID THE REACTION INVOLVE: Swelling of the face/tongue/throat, SOB, or low BP? No Sudden or severe rash/hives, skin peeling, or the inside of the mouth or nose? No Did it require medical treatment? No When did it last happen?  90s If all above answers are "NO", may proceed with cephalosporin use.     Review of Systems: Negative except as noted in the HPI. Objective:   Constitutional [name] is a pleasant 75 y.o. Caucasian female, in NAD. AAO x 3.   Vascular Capillary refill time to digits immediate b/l. Palpable DP pulse(s) b/l lower extremities Palpable PT pulse(s) b/l lower extremities Pedal hair sparse. Lower extremity skin temperature gradient within normal limits. No pain with calf compression b/l. No edema noted b/l lower extremities. No cyanosis or clubbing noted.  Neurologic Normal speech. Oriented to person, place, and time. Protective sensation intact 5/5 intact bilaterally with 10g monofilament b/l. Vibratory sensation intact b/l.  Dermatologic Skin warm and  supple b/l lower extremities. No interdigital macerations b/l lower extremities. Toenails 1-5 b/l elongated, discolored, dystrophic, thickened, crumbly with subungual debris and tenderness to dorsal palpation. Partial thickness pinpoint ulceration distal tip L 3rd toe. There is visible subdermal hemorrhage. There is no surrounding erythema, no edema, no drainage, no odor, no fluctuance.  Orthopedic: Normal muscle strength 5/5 to all lower extremity muscle groups bilaterally. Hammertoe(s) noted to the 2-5 bilaterally.   Radiographs: None Assessment:   1. Pain due to onychomycosis of toenail   2. Skin ulcer of second toe of left foot, limited to breakdown of skin ([place])    Plan:  -Examined patient. -Partial thickness ulceration pared. Cleansed with alcohol. Triple antibiotic ointment.applied. She is to apply Neospoin to digit once daily. Continue to wear toe cap daily for protection.. -Patient to continue soft, supportive shoe gear daily. -Toenails 1-5 b/l were debrided in length and girth with sterile nail nippers and dremel without iatrogenic bleeding.  -Patient to report any pedal injuries to medical professional immediately. -Patient/POA to call should there be question/concern in the interim.  Return in about 1 month (around 11/21/2020).  Marzetta Board, DPM

## 2020-10-28 ENCOUNTER — Telehealth: Payer: Self-pay | Admitting: Pharmacist

## 2020-10-28 NOTE — Chronic Care Management (AMB) (Signed)
Chronic Care Management Pharmacy Assistant   Name: [name]  MRN: [id number] DOB: 05/05/1945  Reason for Encounter: Disease State/ Hypertension Assessment Call   Conditions to be addressed/monitored: HTN  Recent office visits:  06-21-2020 Angela Post, MD - Patient presented for contusion of lower left extremity and other concerns. No medication changes.   05-17-2020 [name], Angela Sierras, MD - Patient presented for mouth pain and other concerns. Prescribed Doxycycline '100mg'$  twice daily.  05-03-2020 [name], Angela Sierras, MD - Patient presented for LLQ pain and other concerns. No medication changes.   Recent consult visits:  10-21-2020 [name], DPM - Patient presented for Pain due to onychomycosis of toenail and other concerns. No medication changes.   08-20-2020 Angela Margarita, MD ( Cardiology ) - Patient presented for Coronary artery calcification seen on CAT scan and other concerns. Stopped Doxycyline and Flonase.  07-24-2020 [name], DPM - Patient presented for Pain due to onychomycosis of toenail and other concerns. No medication changes.   06-14-2020 [name], DPM - Patient presented for Hammer toes of both feet and other concerns. No medication changes.   06-05-2020 [name], Angela Emperor, PA-C [name]) - Patient presented for weight loss. Prescribed Sodium Sulfate. Stopped Solifenacin Succinate.  05-10-2020 [name]  (Urology) - Patient presented for CHG URINALYSIS, AUTO, [name] visits:  None in previous 6 months  Medications: Outpatient Encounter Medications as of 10/28/2020  Medication Sig   ALPRAZolam (XANAX) 1 MG tablet TAKE 1 TABLET BY MOUTH THREE TIMES A DAY AS NEEDED   apixaban (ELIQUIS) 5 MG TABS tablet Take 1 tablet (5 mg total) by mouth 2 (two) times daily.   b complex vitamins capsule Take 2 capsules by mouth daily.    buPROPion (WELLBUTRIN XL) 300 MG 24 hr tablet TAKE 1 TABLET BY MOUTH EVERY DAY    chlorhexidine (PERIDEX) 0.12 % solution SMARTSIG:By Mouth   Cholecalciferol (VITAMIN D) 2000 UNITS CAPS Take 2,000 Int'l Units by mouth.   Coenzyme Q10-Fish Oil-Vit E (CO-Q 10 OMEGA-3 FISH OIL PO) Take 100 mg by mouth daily.    diltiazem (CARDIZEM CD) 120 MG 24 hr capsule Take 1 capsule (120 mg total) by mouth daily.   DULoxetine (CYMBALTA) 60 MG capsule Take 60 mg by mouth daily.   FLUAD QUADRIVALENT 0.5 ML injection    furosemide (LASIX) 20 MG tablet Take 1 tablet (20 mg total) by mouth daily.   gatifloxacin (ZYMAXID) 0.5 % SOLN Place 1 drop into the right eye in the morning, at noon, in the evening, and at bedtime.   loratadine (CLARITIN) 10 MG tablet Take 10 mg by mouth daily.   losartan (COZAAR) 25 MG tablet TAKE 1 TABLET BY MOUTH DAILY.   Lysine 500 MG TABS Take 500 mg by mouth daily.   metoprolol succinate (TOPROL-XL) 25 MG 24 hr tablet Take 1 tablet (25 mg total) by mouth daily.   Multiple Vitamins-Minerals (PRESERVISION AREDS 2 PO) Take 1 capsule by mouth in the morning and at bedtime.   pantoprazole (PROTONIX) 40 MG tablet Take 1 tablet (40 mg total) by mouth daily.   Pitavastatin Calcium (LIVALO) 2 MG TABS Take 1 tablet (2 mg total) by mouth daily.   valACYclovir (VALTREX) 1000 MG tablet TAKE 2 TABLETS AT ONSET OF COLD SORES AND REPEAT 2 IN 12 HOURS.   No facility-administered encounter medications on file as of 10/28/2020.  Reviewed chart prior to disease state call. Spoke with patient regarding BP  Recent Office Vitals: BP Readings from Last 3 Encounters:  08/20/20 110/68  08/13/20 (!) 117/51  06/21/20 130/64   Pulse Readings from Last 3 Encounters:  08/20/20 60  08/13/20 77  06/21/20 86    Wt Readings from Last 3 Encounters:  08/20/20 189 lb 3.2 oz (85.8 kg)  08/13/20 188 lb (85.3 kg)  06/21/20 193 lb 12.8 oz (87.9 kg)     Kidney Function Lab Results  Component Value Date/Time   CREATININE 1.10 (H) 03/12/2020 11:20 AM   CREATININE 1.18 (H) 08/23/2019 01:53 PM    CREATININE 1.36 (H) 04/06/2018 02:17 PM   CREATININE 1.11 (H) 06/04/2015 05:04 PM   CREATININE 1.3 (H) 05/07/2015 09:01 AM   CREATININE 1.1 06/26/2014 02:07 PM   GFR 47.96 (L) 05/31/2015 10:50 AM   GFRNONAA 45 (L) 08/23/2019 01:53 PM   GFRAA 52 (L) 08/23/2019 01:53 PM    BMP Latest Ref Rng & Units 03/12/2020 08/23/2019 04/06/2018  Glucose 65 - 99 mg/dL 125(H) 100(H) 137(H)  BUN 7 - 25 mg/dL '14 21 14  '$ Creatinine 0.60 - 0.93 mg/dL 1.10(H) 1.18(H) 1.36(H)  BUN/Creat Ratio 6 - 22 (calc) 13 18 -  Sodium 135 - 146 mmol/L 143 143 136  Potassium 3.5 - 5.3 mmol/L 5.0 4.5 4.3  Chloride 98 - 110 mmol/L 106 108(H) 98  CO2 20 - 32 mmol/L '27 23 25  '$ Calcium 8.6 - 10.4 mg/dL 9.3 8.9 9.7    Current antihypertensive regimen:  Losartan 25 mg 1 tablet daily Diltiazem 120 mg 1 capsule daily Metoprolol succinate 25 mg 1 tablet daily How often are you checking your Blood Pressure? Patient reports she does not check her blood pressures at home ever.  What recent interventions/DTPs have been made by any provider to improve Blood Pressure control since last CPP Visit: Patient reports none  Any recent hospitalizations or ED visits since last visit with CPP? No What diet changes have been made to improve Blood Pressure Control?  Patient reports she does not like to eat breakfast never has if anything will have a slice of Peanut Butter on toast, for Lunch something like tuna and for dinner eggs or sardines or something small. She reports she eats all meals at home and drinks sweet tea almost always. What exercise is being done to improve your Blood Pressure Control?  Patient reports she gets around inside ok and when she goes out she uses a cane just to be steady and extra safe.   Patient reports she is ok on all of her medications generally doing ok and does not have any other concerns or issues at this time. She does not wish to schedule a F/U at this time with Clinical Pharmacist will call us when she is  ready.   Adherence Review: Is the patient currently on ACE/ARB medication? Yes Does the patient have >5 day gap between last estimated fill dates? No   Care Gaps: COVID Booster #4 Therapist, music) - Overdue Flu Vaccine - Overdue AWV - .MSG sent to Ramond Craver CMA to schedule.  Star Rating Drugs: Losartan 25 mg - Last filled 08-23-2020 90 DS at [facility] Pharmacist Assistant [phone]

## 2020-11-22 ENCOUNTER — Encounter: Payer: Self-pay | Admitting: Podiatry

## 2020-11-22 ENCOUNTER — Other Ambulatory Visit: Payer: Self-pay

## 2020-11-22 ENCOUNTER — Ambulatory Visit: Payer: Medicare Other | Admitting: Podiatry

## 2020-11-22 DIAGNOSIS — Z872 Personal history of diseases of the skin and subcutaneous tissue: Secondary | ICD-10-CM

## 2020-11-26 NOTE — Progress Notes (Signed)
  Subjective:  Patient ID: [name], female    DOB: June 01, 1945,  MRN: [id number]  [name] presents to clinic today for follow up of painful left 3rd toe for toe check. She states toe is a little sore. She has not been wearing her toe caps as much as she should.   She states she is in the middle of selling her home.  PCP is [name], MD , and last visit was 06/21/2020.  Allergies  Allergen Reactions   Chlorhexidine Base Itching    CHG WIPES   Clindamycin/Lincomycin Other (See Comments)    Caused fire like sensation in her stomach    Sulfa Antibiotics Other (See Comments)    Severe GI upset   Penicillins Hives and Rash    DID THE REACTION INVOLVE: Swelling of the face/tongue/throat, SOB, or low BP? No Sudden or severe rash/hives, skin peeling, or the inside of the mouth or nose? No Did it require medical treatment? No When did it last happen?  90s If all above answers are "NO", may proceed with cephalosporin use.     Review of Systems: Negative except as noted in the HPI. Objective:   Constitutional Tierza Bray is a pleasant 75 y.o. Caucasian female, in NAD. AAO x 3.   Vascular Capillary refill time to digits immediate b/l. Palpable DP pulse(s) b/l lower extremities Palpable PT pulse(s) b/l lower extremities Pedal hair sparse. Lower extremity skin temperature gradient within normal limits. No pain with calf compression b/l. No edema noted b/l lower extremities. No cyanosis or clubbing noted.  Neurologic Normal speech. Oriented to person, place, and time. Protective sensation intact 5/5 intact bilaterally with 10g monofilament b/l. Vibratory sensation intact b/l.  Dermatologic Skin warm and supple b/l lower extremities. No interdigital macerations b/l lower extremities. Toenails 1-5 b/l recently debrided. Left 3rd digit with hyperkeratosis with visible subdermal hemorrhage. No erythema, no edema, no drainage, no flucturance.  Orthopedic: Normal muscle  strength 5/5 to all lower extremity muscle groups bilaterally. Hammertoe(s) noted to the 2-5 bilaterally.   Radiographs: None Assessment:   1. Healed ulcer of left foot    Plan:  -Examined patient. -Ulceration distal tip left 3rd digit is healed. Debrided hyperkeratosis and continue to wear toe cap daily for protection.. -Patient to continue soft, supportive shoe gear daily. -Patient to report any pedal injuries to medical professional immediately. -Patient/POA to call should there be question/concern in the interim.  Return in about 6 weeks (around 01/03/2021).  Marzetta Board, DPM

## 2020-12-07 ENCOUNTER — Other Ambulatory Visit: Payer: Self-pay | Admitting: Family Medicine

## 2020-12-09 NOTE — Telephone Encounter (Signed)
Last filled 09/14/2020 Last OV 06/21/2020  Ok to fill?

## 2020-12-18 ENCOUNTER — Telehealth: Payer: Self-pay | Admitting: Family Medicine

## 2020-12-18 NOTE — Telephone Encounter (Signed)
Patient wanted to know if she needs whooping cough vaccine since she had whooping cough when she was a child.  She also wanted to know if she could get a flu shot since she had her covid vaccine and 2 booster. She didn't know when her last booster vaccine was  She wanted to know if she needs shingles vaccine she stated she had a mild case of shingles  and  can shingles come back  Please advise

## 2020-12-24 ENCOUNTER — Encounter: Payer: Self-pay | Admitting: Family Medicine

## 2020-12-24 ENCOUNTER — Telehealth: Payer: Self-pay | Admitting: Family Medicine

## 2020-12-24 ENCOUNTER — Telehealth (INDEPENDENT_AMBULATORY_CARE_PROVIDER_SITE_OTHER): Payer: Medicare Other | Admitting: Family Medicine

## 2020-12-24 DIAGNOSIS — U071 COVID-19: Secondary | ICD-10-CM

## 2020-12-24 MED ORDER — MOLNUPIRAVIR EUA 200MG CAPSULE: 4.0000 | ORAL_CAPSULE | Freq: Two times a day (BID) | ORAL | 0 refills | Status: AC

## 2020-12-24 MED ORDER — BENZONATATE 100 MG PO CAPS: 100.0000 mg | ORAL_CAPSULE | Freq: Three times a day (TID) | ORAL | 0 refills | Status: DC | PRN

## 2020-12-24 NOTE — Telephone Encounter (Signed)
Pt call and stated she tested positive  today for covid and want dr. Elease Hashimoto to call her something in for covid at  [id number] - [place], [place] Phone:  [phone]  Fax:  [phone]

## 2020-12-24 NOTE — Patient Instructions (Signed)
HOME CARE TIPS:  -I sent the medication(s) we discussed to your pharmacy: Meds ordered this encounter  Medications   molnupiravir EUA (LAGEVRIO) 200 mg CAPS capsule    Sig: Take 4 capsules (800 mg total) by mouth 2 (two) times daily for 5 days.    Dispense:  40 capsule    Refill:  0   benzonatate (TESSALON PERLES) 100 MG capsule    Sig: Take 1 capsule (100 mg total) by mouth 3 (three) times daily as needed.    Dispense:  20 capsule    Refill:  0     -I sent in the [place] treatment or referral you requested per our discussion. Please see the information provided below and discuss further with the pharmacist/treatment team.   -there is a chance of rebound illness after finishing your treatment. If you become sick again please isolate for an additional 5 days, plus 5 more days of masking.   -can use tylenol if needed for fevers, aches and pains per instructions  -can use nasal saline a few times per day if you have nasal congestion  -stay hydrated, drink plenty of fluids and eat small healthy meals - avoid dairy  -If the Covid test is positive, check out the [facility] website for more information on home care, transmission and treatment for COVID19  -follow up with your doctor in 2-3 days unless improving and feeling better  -stay home while sick, except to seek medical care. If you have COVID19, ideally it would be best to stay home for a full 10 days since the onset of symptoms PLUS one day of no fever and feeling better. Wear a good mask that fits snugly (such as N95 or KN95) if around others to reduce the risk of transmission.  It was nice to meet you today, and I really hope you are feeling better soon. I help [place] out with telemedicine visits on Tuesdays and Thursdays and am available for visits on those days. If you have any concerns or questions following this visit please schedule a follow up visit with your Primary Care doctor or seek care at a local urgent care clinic to  avoid delays in care.    Seek in person care or schedule a follow up video visit promptly if your symptoms worsen, new concerns arise or you are not improving with treatment. Call 911 and/or seek emergency care if your symptoms are severe or life threatening.    Fact Sheet for Patients And Caregivers Emergency Use Authorization (EUA) Of LAGEVRIOT (molnupiravir) capsules For Coronavirus Disease 2019 (COVID-19)  What is the most important information I should know about LAGEVRIO? LAGEVRIO may cause serious side effects, including: ? LAGEVRIO may cause harm to your unborn baby. It is not known if LAGEVRIO will harm your baby if you take LAGEVRIO during pregnancy. o LAGEVRIO is not recommended for use in pregnancy. o LAGEVRIO has not been studied in pregnancy. LAGEVRIO was studied in pregnant animals only. When LAGEVRIO was given to pregnant animals, LAGEVRIO caused harm to their unborn babies. o You and your healthcare provider may decide that you should take LAGEVRIO during pregnancy if there are no other COVID-19 treatment options approved or authorized by the FDA that are accessible or clinically appropriate for you. o If you and your healthcare provider decide that you should take LAGEVRIO during pregnancy, you and your healthcare provider should discuss the known and potential benefits and the potential risks of taking LAGEVRIO during pregnancy. For individuals who are able  to become pregnant: ? You should use a reliable method of birth control (contraception) consistently and correctly during treatment with LAGEVRIO and for 4 days after the last dose of LAGEVRIO. Talk to your healthcare provider about reliable birth control methods. ? Before starting treatment with [facility] your healthcare provider may do a pregnancy test to see if you are pregnant before starting treatment with LAGEVRIO. ? Tell your healthcare provider right away if you become pregnant or think you may be pregnant  during treatment with LAGEVRIO. Pregnancy Surveillance Program: ? There is a pregnancy surveillance program for individuals who take LAGEVRIO during pregnancy. The purpose of this program is to collect information about the health of you and your baby. Talk to your healthcare provider about how to take part in this program. ? If you take LAGEVRIO during pregnancy and you agree to participate in the pregnancy surveillance program and allow your healthcare provider to share your information with [place], then your healthcare provider will report your use of [place] during pregnancy to [place]. by calling [phone] or PeacefulBlog.es. For individuals who are sexually active with partners who are able to become pregnant: ? It is not known if LAGEVRIO can affect sperm. While the risk is regarded as low, animal studies to fully assess the potential for LAGEVRIO to affect the babies of males treated with LAGEVRIO have not been completed. A reliable method of birth control (contraception) should be used consistently and correctly during treatment with LAGEVRIO and for at least 3 months after the last dose. The risk to sperm beyond 3 months is not known. Studies to understand the risk to sperm beyond 3 months are ongoing. Talk to your healthcare provider about reliable birth control methods. Talk to your healthcare provider if you have questions or concerns about how LAGEVRIO may affect sperm. You are being given this fact sheet because your healthcare provider believes it is necessary to provide you with LAGEVRIO for the treatment of adults with mild-to-moderate coronavirus disease 2019 (COVID-19) with positive results of direct SARS-CoV-2 viral testing, and who are at high risk for progression to severe COVID-19 including hospitalization or death, and for whom other COVID-19 treatment options approved or authorized by the FDA are not accessible or  clinically appropriate. The U.S. Food and Drug Administration (FDA) has issued an Emergency Use Authorization (EUA) to make LAGEVRIO available during the COVID-19 pandemic (for more details about an EUA please see "What is an Emergency Use Authorization?" at the end of this document). LAGEVRIO is not an FDA-approved medicine in the [place]. Read this Fact Sheet for information about LAGEVRIO. Talk to your healthcare provider about your options if you have any questions. It is your choice to take LAGEVRIO.  What is COVID-19? COVID-19 is caused by a virus called a coronavirus. You can get COVID-19 through close contact with another person who has the virus. COVID-19 illnesses have ranged from very mild-to-severe, including illness resulting in death. While information so far suggests that most COVID-19 illness is mild, serious illness can happen and may cause some of your other medical conditions to become worse. Older people and people of all ages with severe, long lasting (chronic) medical conditions like heart disease, lung disease and diabetes, for example seem to be at higher risk of being hospitalized for COVID-19.  What is LAGEVRIO? LAGEVRIO is an investigational medicine used to treat mild-to-moderate COVID-19 in adults: ? with positive results of direct SARS-CoV-2 viral testing, and ? who  are at high risk for progression to severe COVID-19 including hospitalization or death, and for whom other COVID-19 treatment options approved or authorized by the FDA are not accessible or clinically appropriate. The FDA has authorized the emergency use of LAGEVRIO for the treatment of mild-tomoderate COVID-19 in adults under an EUA. For more information on EUA, see the "What is an Emergency Use Authorization (EUA)?" section at the end of this Fact Sheet. LAGEVRIO is not authorized: ? for use in people less than 54 years of age. ? for prevention of COVID-19. ? for people needing  hospitalization for COVID-19. ? for use for longer than 5 consecutive days.  What should I tell my healthcare provider before I take LAGEVRIO? Tell your healthcare provider if you: ? Have any allergies ? Are breastfeeding or plan to breastfeed ? Have any serious illnesses ? Are taking any medicines (prescription, over-the-counter, vitamins, or herbal products).  How do I take LAGEVRIO? ? Take LAGEVRIO exactly as your healthcare provider tells you to take it. ? Take 4 capsules of LAGEVRIO every 12 hours (for example, at 8 am and at 8 pm) ? Take LAGEVRIO for 5 days. It is important that you complete the full 5 days of treatment with LAGEVRIO. Do not stop taking LAGEVRIO before you complete the full 5 days of treatment, even if you feel better. ? Take LAGEVRIO with or without food. ? You should stay in isolation for as long as your healthcare provider tells you to. Talk to your healthcare provider if you are not sure about how to properly isolate while you have COVID-19. ? Swallow LAGEVRIO capsules whole. Do not open, break, or crush the capsules. If you cannot swallow capsules whole, tell your healthcare provider. ? What to do if you miss a dose: o If it has been less than 10 hours since the missed dose, take it as soon as you remember o If it has been more than 10 hours since the missed dose, skip the missed dose and take your dose at the next scheduled time. ? Do not double the dose of LAGEVRIO to make up for a missed dose.  What are the important possible side effects of LAGEVRIO? ? See, "What is the most important information I should know about LAGEVRIO?" ? Allergic Reactions. Allergic reactions can happen in people taking LAGEVRIO, even after only 1 dose. Stop taking LAGEVRIO and call your healthcare provider right away if you get any of the following symptoms of an allergic reaction: o hives o rapid heartbeat o trouble swallowing or breathing o swelling of the mouth, lips,  or face o throat tightness o hoarseness o skin rash The most common side effects of LAGEVRIO are: ? diarrhea ? nausea ? dizziness These are not all the possible side effects of LAGEVRIO. Not many people have taken LAGEVRIO. Serious and unexpected side effects may happen. This medicine is still being studied, so it is possible that all of the risks are not known at this time.  What other treatment choices are there?  Veklury (remdesivir) is FDA-approved as an intravenous (IV) infusion for the treatment of mildto-moderate YIRSW-54 in certain adults and children. Talk with your doctor to see if Marijean Heath is appropriate for you. Like LAGEVRIO, FDA may also allow for the emergency use of other medicines to treat people with COVID-19. Go to LacrosseProperties.si for more information. It is your choice to be treated or not to be treated with LAGEVRIO. Should you decide not to take it, it will  not change your standard medical care.  What if I am breastfeeding? Breastfeeding is not recommended during treatment with LAGEVRIO and for 4 days after the last dose of LAGEVRIO. If you are breastfeeding or plan to breastfeed, talk to your healthcare provider about your options and specific situation before taking LAGEVRIO.  How do I report side effects with LAGEVRIO? Contact your healthcare provider if you have any side effects that bother you or do not go away. Report side effects to FDA MedWatch at SmoothHits.hu or call 1-800-FDA-1088 (1- 801-630-9573).  How should I store [place]? ? Store LAGEVRIO capsules at room temperature between 91F to 66F (20C to 25C). ? Keep LAGEVRIO and all medicines out of the reach of children and pets. How can I learn more about COVID-19? ? Ask your healthcare provider. ? Visit SeekRooms.co.uk ? Contact your local or state public health department. ?  Call [place] at [phone] (toll free in the [place] ? Visit www.molnupiravir.com  What Is an Emergency Use Authorization (EUA)? The [place] FDA has made [place] available under an emergency access mechanism called an Emergency Use Authorization (EUA) The EUA is supported by a Presenter, broadcasting Health and Human Service (HHS) declaration that circumstances exist to justify emergency use of drugs and biological products during the COVID-19 pandemic. LAGEVRIO for the treatment of mild-to-moderate COVID-19 in adults with positive results of direct SARS-CoV-2 viral testing, who are at high risk for progression to severe COVID-19, including hospitalization or death, and for whom alternative COVID-19 treatment options approved or authorized by FDA are not accessible or clinically appropriate, has not undergone the same type of review as an FDA-approved product. In issuing an EUA under the ORVIF-53 public health emergency, the FDA has determined, among other things, that based on the total amount of scientific evidence available including data from adequate and well-controlled clinical trials, if available, it is reasonable to believe that the product may be effective for diagnosing, treating, or preventing COVID-19, or a serious or life-threatening disease or condition caused by COVID-19; that the known and potential benefits of the product, when used to diagnose, treat, or prevent such disease or condition, outweigh the known and potential risks of such product; and that there are no adequate, approved, and available alternatives.  All of these criteria must be met to allow for the product to be used in the treatment of patients during the COVID-19 pandemic. The EUA for LAGEVRIO is in effect for the duration of the COVID-19 declaration justifying emergency use of LAGEVRIO, unless terminated or revoked (after which LAGEVRIO may no longer be used under the EUA). For patent  information: http://rogers.info/ Copyright  2021-2022 [place] [place], [place] and its affiliates. All rights reserved. [id number] Revised: March 2022

## 2020-12-24 NOTE — Progress Notes (Signed)
Virtual Visit via Video Note  I connected with [name]  on 12/24/20 at  3:20 PM EDT by a video enabled telemedicine application and verified that I am speaking with the correct person using two identifiers.  Location patient: home, [place] Location provider:work or home office Persons participating in the virtual visit: patient, provider, patient's son  I discussed the limitations of evaluation and management by telemedicine and the availability of in person appointments. The patient expressed understanding and agreed to proceed.   HPI:  Acute telemedicine visit for Covid19: -Onset: 2-3 days ago -Symptoms include: cough, pnd, diarrhea (now resolved) -Denies:fevers, CP, SOB, Vomiting, inability to eat/drink/get out of better -Pertinent past medical history: see below -Pertinent medication allergies:  Allergies  Allergen Reactions   Chlorhexidine Base Itching    CHG WIPES   Clindamycin/Lincomycin Other (See Comments)    Caused fire like sensation in her stomach    Sulfa Antibiotics Other (See Comments)    Severe GI upset   Penicillins Hives and Rash    DID THE REACTION INVOLVE: Swelling of the face/tongue/throat, SOB, or low BP? No Sudden or severe rash/hives, skin peeling, or the inside of the mouth or nose? No Did it require medical treatment? No When did it last happen?  90s If all above answers are "NO", may proceed with cephalosporin use.   -COVID-19 vaccine status: vaccinated x2 and had 1 booster  ROS: See pertinent positives and negatives per HPI.  Past Medical History:  Diagnosis Date   ABDOMINAL PAIN RIGHT UPPER QUADRANT 11/08/2008   ALLERGIC RHINITIS 08/01/2009   Anxiety    Aortic stenosis    mild by echo 08/2020   Cataract    Complication of anesthesia 01/31/2015   ? doesn't remember anything after being taken to pre surgery   Coronary artery calcification seen on CAT scan 05/30/2009   moderate risk coronary calcium score by chest CT   DDD (degenerative disc  disease), lumbar    DEPRESSION 06/13/2008   DYSLIPIDEMIA 02/27/2009   DYSPNEA 02/27/2009   Dysrhythmia    Afib   Frequency of urination    GERD (gastroesophageal reflux disease)    "years ago"   Head injury 2016   did not loose consciousness   History of Bell's palsy    History of DVT of lower extremity 2006-- CHRONIC COUMADIN THERAPY   History of hiatal hernia    History of Lyme disease    Hydronephrosis, left 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 - [place]- "you have had 3 strokes"    Past Surgical History:  Procedure Laterality Date   BREAST MASS EXCISION     BENIGN-- RIGHT BREAST   CARDIOVASCULAR STRESS TEST  04-04-2009-- PER PT ASYMPTOMATIC-- MEDICAL MANAGEMENT   STUDY WAS EQUIVOCAL/ EF 49%/ GLOBAL HYPOKINESIS/ APPEARS TO BE A MILD ANTERIOR PERFUSION DEFECT COULD REPRESENT ISCHEMIA BUT DUE TO  ACTIVITY WORSE IN STRESS THAN AT REST POSS. THE DEFECT WAS ARTIFACTUAL   CARDIOVERSION N/A 07/11/2015   Procedure: CARDIOVERSION;  Surgeon: Larey Dresser, MD;  Location: [facility];  Service: Cardiovascular;  Laterality: N/A;   CT ANGIOGRAM  FEB 2011   CALCIUM SCORE 161/ POSS. MODERATE MID LAD STENOSIS   CYSTOSCOPY W/ RETROGRADES  04/28/2011   Procedure: CYSTOSCOPY WITH RETROGRADE PYELOGRAM;  Surgeon: Fredricka Bonine, MD;  Location[facility];  Service: Urology;  Laterality: Left;   CYSTOSCOPY W/ URETERAL STENT REMOVAL  04/28/2011   Procedure: CYSTOSCOPY WITH STENT REMOVAL;  Surgeon: Fredricka Bonine, MD;  Location: [facility];  Service: Urology;;   EXPLORATORY LAPAROTOMY   2005   LYMPHOMA   EYE SURGERY Bilateral    cataract    GAS INSERTION Right 03/31/2018   Procedure: RIGHT EYE INSERTION OF GAS C3F8;  Surgeon: Bernarda Caffey, MD;  Location: [facility];  Service: Ophthalmology;  Laterality: Right;   KNEE ARTHROSCOPY  2005   RIGHT   LASER PHOTO ABLATION Right 03/31/2018   Procedure: RIGHT EYE LASER PHOTO ABLATION;  Surgeon: Bernarda Caffey, MD;  Location: [facility];  Service: Ophthalmology;  Laterality: Right;   MULTIPLE CYSTO/ LEFT URETERAL STENT EXCHANGES  LAST ONE 10-28-10   RETROPERITONEAL BX  2007   REVERSE SHOULDER ARTHROPLASTY Right 01/31/2015   Procedure: RIGHT REVERSE SHOULDER ARTHROPLASTY;  Surgeon: Justice Britain, MD;  Location: [facility];  Service: Orthopedics;  Laterality: Right;   TONSILLECTOMY  CHILD   TOTAL KNEE ARTHROPLASTY  OCT 2010   RIGHT   TOTAL KNEE ARTHROPLASTY  JAN 2010   LEFT   TRANSTHORACIC ECHOCARDIOGRAM  [id number]   MILD SYSTOLIC DYSFUNCTION/ EF 09-38%/ MODERATE DIASTOLIC DYSFUCTION/ MILD MR/ MILD BIATRIAL ENLARGEMENT   VITRECTOMY 25 GAUGE WITH SCLERAL BUCKLE Right 03/31/2018   Procedure: RIGHT VITRECTOMY 25 GAUGE WITH SCLERAL BUCKLE;  Surgeon: Bernarda Caffey, MD;  Location: [facility];  Service: Ophthalmology;  Laterality: Right;     Current Outpatient Medications:    ALPRAZolam (XANAX) 1 MG tablet, TAKE 1 TABLET BY MOUTH THREE TIMES A DAY AS NEEDED, Disp: 90 tablet, Rfl: 3   apixaban (ELIQUIS) 5 MG TABS tablet, Take 1 tablet (5 mg total) by mouth 2 (two) times daily., Disp: 60 tablet, Rfl: 5   b complex vitamins capsule, Take 2 capsules by mouth daily. , Disp: , Rfl:    benzonatate (TESSALON PERLES) 100 MG capsule, Take 1 capsule (100 mg total) by mouth 3 (three) times daily as needed., Disp: 20 capsule, Rfl: 0   buPROPion (WELLBUTRIN XL) 300 MG 24 hr tablet, TAKE 1 TABLET BY MOUTH EVERY DAY, Disp: 90 tablet, Rfl: 3   chlorhexidine (PERIDEX) 0.12 % solution, SMARTSIG:By Mouth, Disp: , Rfl:    Cholecalciferol (VITAMIN D) 2000 UNITS CAPS, Take 2,000  Int'l Units by mouth., Disp: , Rfl:    Coenzyme Q10-Fish Oil-Vit E (CO-Q 10 OMEGA-3 FISH OIL PO), Take 100 mg by mouth daily. , Disp: , Rfl:    diltiazem (CARDIZEM CD) 120 MG 24 hr capsule, Take 1 capsule (120 mg total) by mouth daily., Disp: 90 capsule, Rfl: 3   DULoxetine (CYMBALTA) 60 MG capsule, Take 60 mg by mouth daily., Disp: , Rfl:    FLUAD QUADRIVALENT 0.5 ML injection, , Disp: , Rfl:    furosemide (LASIX) 20 MG tablet, Take 1 tablet (20 mg total) by mouth daily., Disp: 90 tablet, Rfl: 3   gatifloxacin (ZYMAXID) 0.5 % SOLN, Place 1 drop into the right eye in the morning, at noon, in the evening, and at bedtime., Disp: , Rfl:    losartan (COZAAR) 25 MG tablet, TAKE 1 TABLET BY MOUTH DAILY., Disp: 90 tablet, Rfl: 3  Lysine 500 MG TABS, Take 500 mg by mouth daily., Disp: , Rfl:    metoprolol succinate (TOPROL-XL) 25 MG 24 hr tablet, Take 1 tablet (25 mg total) by mouth daily., Disp: 90 tablet, Rfl: 3   molnupiravir EUA (LAGEVRIO) 200 mg CAPS capsule, Take 4 capsules (800 mg total) by mouth 2 (two) times daily for 5 days., Disp: 40 capsule, Rfl: 0   Multiple Vitamins-Minerals (PRESERVISION AREDS 2 PO), Take 1 capsule by mouth in the morning and at bedtime., Disp: , Rfl:    pantoprazole (PROTONIX) 40 MG tablet, Take 1 tablet (40 mg total) by mouth daily., Disp: 90 tablet, Rfl: 3   Pitavastatin Calcium (LIVALO) 2 MG TABS, Take 1 tablet (2 mg total) by mouth daily., Disp: 90 tablet, Rfl: 3   SUTAB [phone] MG TABS, Take 1 kit by mouth once., Disp: , Rfl:    valACYclovir (VALTREX) 1000 MG tablet, TAKE 2 TABLETS AT ONSET OF COLD SORES AND REPEAT 2 IN 12 HOURS., Disp: 30 tablet, Rfl: 1  EXAM:  VITALS per patient if applicable:  GENERAL: alert, oriented, appears well and in no acute distress  HEENT: atraumatic, conjunttiva clear, no obvious abnormalities on inspection of external nose and ears  NECK: normal movements of the head and neck  LUNGS: on inspection no signs of respiratory  distress, breathing rate appears normal, no obvious gross SOB, gasping or wheezing  CV: no obvious cyanosis  MS: moves all visible extremities without noticeable abnormality  PSYCH/NEURO: pleasant and cooperative, no obvious depression or anxiety, speech and thought processing grossly intact  ASSESSMENT AND PLAN:  Discussed the following assessment and plan:  COVID-19   Discussed treatment options (infusions and oral options and risk of drug interactions), ideal treatment window, potential complications, isolation and precautions for COVID-19.  Discussed possibility of rebound with antivirals and the need to reisolate if it should occur for 5 days. Checked for/reviewed any labs done in the last 90 days with GFR listed in HPI if available.  After lengthy discussion, the patient opted for treatment with Lagevrio due to being higher risk for complications of covid or severe disease and other factors. Discussed EUA status of this drug and the fact that there is preliminary limited knowledge of risks/interactions/side effects per EUA document vs possible benefits and precautions. This information was shared with patient during the visit and also was provided in patient instructions. Also, advised that patient discuss risks/interactions and use with pharmacist/treatment team as well.  The patient did want a prescription for cough, Tessalon Rx sent.  Other symptomatic care measures summarized in patient instructions. Advised to seek prompt in person care if worsening, new symptoms arise, or if is not improving with treatment. Discussed options for inperson care if PCP office not available. Did let this patient know that I only do telemedicine on Tuesdays and Thursdays for [place]. Advised to schedule follow up visit with PCP or [facility] if any further questions or concerns to avoid delays in care.   I discussed the assessment and treatment plan with the patient. The patient was provided an opportunity to ask  questions and all were answered. The patient agreed with the plan and demonstrated an understanding of the instructions.     [name], DO

## 2020-12-24 NOTE — Telephone Encounter (Signed)
Spoke with the patient. She has scheduled a virtual visit with Dr. Maudie Mercury for this. Nothing further needed.

## 2020-12-31 ENCOUNTER — Telehealth: Payer: Self-pay

## 2020-12-31 ENCOUNTER — Ambulatory Visit: Payer: Medicare Other

## 2020-12-31 NOTE — Telephone Encounter (Signed)
Called patient x 3 for 100pm [place] telephone visit.  No answer and no VM.  Patient may reschedule for next available appointment.   L.Zylpha Poynor,LPN

## 2021-01-09 ENCOUNTER — Ambulatory Visit: Payer: Medicare Other

## 2021-01-09 ENCOUNTER — Telehealth: Payer: Self-pay | Admitting: Family Medicine

## 2021-01-09 NOTE — Telephone Encounter (Signed)
Patient has just gotten over covid and wants to know if she should test again and if she should get the fourth booster.      Good callback number is [phone]     Please Advise

## 2021-01-10 NOTE — Telephone Encounter (Signed)
Left message for patient to call back  

## 2021-01-10 NOTE — Telephone Encounter (Signed)
Noted  

## 2021-01-10 NOTE — Telephone Encounter (Signed)
Spoke with the patient, she is aware of Dr. Erick Blinks message. The patient stated that she has a new grandchild and would like to know if there are any other vaccines she should.

## 2021-01-10 NOTE — Telephone Encounter (Signed)
Patient returned call and was informed of message below.

## 2021-01-13 NOTE — Telephone Encounter (Signed)
Left message for patient to call back  

## 2021-01-14 NOTE — Telephone Encounter (Signed)
Left message for patient to call back. Unable to reach the patient. Message will be closed.

## 2021-01-22 ENCOUNTER — Telehealth: Payer: Self-pay | Admitting: Pharmacist

## 2021-01-22 NOTE — Progress Notes (Signed)
Chronic Care Management Pharmacy Assistant   Name: [name]  MRN: [id number] DOB: 1945/09/14  Reason for Encounter: General Assessment Call   Conditions to be addressed/monitored: HTN  Recent office visits:  12/24/20  Angela Kern, DO - Patient presented for [place] -40. Prescribed Benzonatate 100 mg & Molnupiravir 800 mg.  Recent consult visits:  11/22/20 Marzetta Board, DPM (Podiatry) - Patient presented for Healed ulcer of left foot. No medication changes.   Hospital visits:  None in previous 6 months  Medications: Outpatient Encounter Medications as of 01/22/2021  Medication Sig   ALPRAZolam (XANAX) 1 MG tablet TAKE 1 TABLET BY MOUTH THREE TIMES A DAY AS NEEDED   apixaban (ELIQUIS) 5 MG TABS tablet Take 1 tablet (5 mg total) by mouth 2 (two) times daily.   b complex vitamins capsule Take 2 capsules by mouth daily.    benzonatate (TESSALON PERLES) 100 MG capsule Take 1 capsule (100 mg total) by mouth 3 (three) times daily as needed.   buPROPion (WELLBUTRIN XL) 300 MG 24 hr tablet TAKE 1 TABLET BY MOUTH EVERY DAY   chlorhexidine (PERIDEX) 0.12 % solution SMARTSIG:By Mouth   Cholecalciferol (VITAMIN D) 2000 UNITS CAPS Take 2,000 Int'l Units by mouth.   Coenzyme Q10-Fish Oil-Vit E (CO-Q 10 OMEGA-3 FISH OIL PO) Take 100 mg by mouth daily.    diltiazem (CARDIZEM CD) 120 MG 24 hr capsule Take 1 capsule (120 mg total) by mouth daily.   DULoxetine (CYMBALTA) 60 MG capsule Take 60 mg by mouth daily.   FLUAD QUADRIVALENT 0.5 ML injection    furosemide (LASIX) 20 MG tablet Take 1 tablet (20 mg total) by mouth daily.   gatifloxacin (ZYMAXID) 0.5 % SOLN Place 1 drop into the right eye in the morning, at noon, in the evening, and at bedtime.   losartan (COZAAR) 25 MG tablet TAKE 1 TABLET BY MOUTH DAILY.   Lysine 500 MG TABS Take 500 mg by mouth daily.   metoprolol succinate (TOPROL-XL) 25 MG 24 hr tablet Take 1 tablet (25 mg total) by mouth daily.   Multiple Vitamins-Minerals  (PRESERVISION AREDS 2 PO) Take 1 capsule by mouth in the morning and at bedtime.   pantoprazole (PROTONIX) 40 MG tablet Take 1 tablet (40 mg total) by mouth daily.   Pitavastatin Calcium (LIVALO) 2 MG TABS Take 1 tablet (2 mg total) by mouth daily.   SUTAB [phone] MG TABS Take 1 kit by mouth once.   valACYclovir (VALTREX) 1000 MG tablet TAKE 2 TABLETS AT ONSET OF COLD SORES AND REPEAT 2 IN 12 HOURS.   No facility-administered encounter medications on file as of 01/22/2021.  Reviewed chart prior to disease state call. Spoke with patient regarding BP  Recent Office Vitals: BP Readings from Last 3 Encounters:  08/20/20 110/68  08/13/20 (!) 117/51  06/21/20 130/64   Pulse Readings from Last 3 Encounters:  08/20/20 60  08/13/20 77  06/21/20 86    Wt Readings from Last 3 Encounters:  08/20/20 189 lb 3.2 oz (85.8 kg)  08/13/20 188 lb (85.3 kg)  06/21/20 193 lb 12.8 oz (87.9 kg)     Kidney Function Lab Results  Component Value Date/Time   CREATININE 1.10 (H) 03/12/2020 11:20 AM   CREATININE 1.18 (H) 08/23/2019 01:53 PM   CREATININE 1.36 (H) 04/06/2018 02:17 PM   CREATININE 1.11 (H) 06/04/2015 05:04 PM   CREATININE 1.3 (H) 05/07/2015 09:01 AM   CREATININE 1.1 06/26/2014 02:07 PM   GFR 47.96 (L) 05/31/2015  10:50 AM   GFRNONAA 45 (L) 08/23/2019 01:53 PM   GFRAA 52 (L) 08/23/2019 01:53 PM    BMP Latest Ref Rng & Units 03/12/2020 08/23/2019 04/06/2018  Glucose 65 - 99 mg/dL 125(H) 100(H) 137(H)  BUN 7 - 25 mg/dL 14 21 14   Creatinine 0.60 - 0.93 mg/dL 1.10(H) 1.18(H) 1.36(H)  BUN/Creat Ratio 6 - 22 (calc) 13 18 -  Sodium 135 - 146 mmol/L 143 143 136  Potassium 3.5 - 5.3 mmol/L 5.0 4.5 4.3  Chloride 98 - 110 mmol/L 106 108(H) 98  CO2 20 - 32 mmol/L 27 23 25   Calcium 8.6 - 10.4 mg/dL 9.3 8.9 9.7    Current antihypertensive regimen:  Losartan 25 mg 1 tablet daily Diltiazem 120 mg 1 capsule daily Metoprolol succinate 25 mg 1 tablet daily How often are you checking your Blood  Pressure? when feeling symptomatic Current home BP readings: Patient reports she does not have a home cuff has it checked often when she visits the doctors and has been doing well. She reports not having andy headaches or symptoms of increased or high blood pressures. Advised she may purchase an inexpensive one online or amazon to have on hand. What recent interventions/DTPs have been made by any provider to improve Blood Pressure control since last CPP Visit: Patient reports no change Any recent hospitalizations or ED visits since last visit with CPP? None What diet changes have been made to improve Blood Pressure Control?  Patient reports her eating habits are unchanged What exercise is being done to improve your Blood Pressure Control?  Patient reports having to keep up and help her husband and has been having some stress there as he has recently been diagnosed with pulmonary fibrosis and is using an oxygen tank. Patient reports her only new concern is that she is waking to use the restroom once during the night and with all she has going on has trouble falling back to sleep. She is unable to quiet her mind and will lay there for several hours. She reports this was not ever the case for her would always fall back to sleep. She reports she takes xanax and does not want to take another medication pill.   Adherence Review: Is the patient currently on ACE/ARB medication? Yes Does the patient have >5 day gap between last estimated fill dates? No   Care Gaps: COVID Booster #4 Therapist, music) - Overdue Flu Vaccine - Overdue AWV - 02/07/21 CCM- Liana Gerold and Patient declined BP- 110/68 (5/23)   Star Rating Drugs: Pitavastatn Calcium (Livalo) 2 mg - [facility] Losartan (Cozaar) 25 mg - Last filled [facility]/14/2020 [facility]  Patient assistance for the following medications : None patient reports that she did not in the past that she can recall apply for assistance with her Eliquis as  she sold a house and wouldn't meet income requirements. She does report that the price for it and her Livalo have gone up and is expensive as she and her husband take Eliquis.   [place] Clinical Pharmacist Assistant [phone]

## 2021-01-28 ENCOUNTER — Ambulatory Visit: Payer: Medicare Other

## 2021-03-03 ENCOUNTER — Encounter: Payer: Self-pay | Admitting: Podiatry

## 2021-03-03 ENCOUNTER — Ambulatory Visit: Payer: Medicare Other

## 2021-03-03 ENCOUNTER — Other Ambulatory Visit: Payer: Self-pay

## 2021-03-03 ENCOUNTER — Ambulatory Visit: Payer: Medicare Other | Admitting: Podiatry

## 2021-03-03 DIAGNOSIS — L84 Corns and callosities: Secondary | ICD-10-CM | POA: Diagnosis not present

## 2021-03-03 DIAGNOSIS — M79676 Pain in unspecified toe(s): Secondary | ICD-10-CM

## 2021-03-03 DIAGNOSIS — B351 Tinea unguium: Secondary | ICD-10-CM

## 2021-03-03 DIAGNOSIS — D689 Coagulation defect, unspecified: Secondary | ICD-10-CM

## 2021-03-07 NOTE — Progress Notes (Signed)
Subjective: [name] is a 75 y.o. female patient seen today for at risk foot care with h/o clotting disorder. She has h/o DVT and is on Eliquis. Patient states no new problems. She has not been wearing toe caps as instructed. She is also seen for  follow up of  painful thick toenails that are difficult to trim. Pain interferes with ambulation. Aggravating factors include wearing enclosed shoe gear. Pain is relieved with periodic professional debridement.  New problems reported today: None.  PCP is [name], MD. Last visit was: 06/21/2020.  Allergies  Allergen Reactions   Chlorhexidine Base Itching    CHG WIPES   Clindamycin/Lincomycin Other (See Comments)    Caused fire like sensation in her stomach    Sulfa Antibiotics Other (See Comments)    Severe GI upset   Penicillins Hives and Rash    DID THE REACTION INVOLVE: Swelling of the face/tongue/throat, SOB, or low BP? No Sudden or severe rash/hives, skin peeling, or the inside of the mouth or nose? No Did it require medical treatment? No When did it last happen?  90s If all above answers are NO, may proceed with cephalosporin use.     Objective: Physical Exam  General: Patient is a pleasant 75 y.o. Caucasian female obese in NAD. AAO x 3.   Neurovascular Examination: CFT immediate b/l LE. Palpable DP/PT pulses b/l LE. Digital hair sparse b/l. Skin temperature gradient WNL b/l. No pain with calf compression b/l. No edema noted b/l. No cyanosis or clubbing noted b/l LE.  Protective sensation intact 5/5 intact bilaterally with 10g monofilament b/l. Vibratory sensation intact b/l.  Dermatological:  Pedal integument with normal turgor, texture and tone BLE. No open wounds b/l LE. No interdigital macerations noted b/l LE. Toenails 1-5 b/l elongated, discolored, dystrophic, thickened, crumbly with subungual debris and tenderness to dorsal palpation. Preulcerative lesion noted L 3rd toe, R 3rd toe, and R 4th toe. There  is visible subdermal hemorrhage. There is no surrounding erythema, no edema, no drainage, no odor, no fluctuance.  Musculoskeletal:  Normal muscle strength 5/5 to all lower extremity muscle groups bilaterally. Adductovarus deformity L 3rd toe, L 4th toe, L 5th toe, R 4th toe, and R 5th toe.Marland Kitchen No pain, crepitus or joint limitation noted with ROM b/l LE.  Patient ambulates independently without assistive aids.  Assessment: 1. Pain due to onychomycosis of toenail   2. Pre-ulcerative corn or callous   3. Clotting disorder [facility])     Plan: Patient was evaluated and treated and all questions answered. Consent given for treatment as described below: -Examined patient. -Mycotic toenails 1-5 bilaterally were debrided in length and girth with sterile nail nippers and dremel without incident. -Preulcerative lesion pared L 3rd toe, R 3rd toe, and R 4th toe. Total number pared=3. -Continue toe caps or toe crests to afftected digits daily for protection. -Patient/POA to call should there be question/concern in the interim.  Return in about 3 months (around 06/01/2021).  Marzetta Board, DPM

## 2021-04-03 ENCOUNTER — Other Ambulatory Visit: Payer: Self-pay | Admitting: Family Medicine

## 2021-04-04 NOTE — Telephone Encounter (Signed)
Last filled 12/09/2020 Last OV 12/24/2020  Ok to fill?

## 2021-04-25 ENCOUNTER — Telehealth: Payer: Self-pay | Admitting: Pharmacist

## 2021-04-25 NOTE — Chronic Care Management (AMB) (Signed)
Chronic Care Management Pharmacy Assistant   Name: [name]  MRN: [id number] DOB: Sep 04, 1945  Reason for Encounter: Disease State / General Assessment    Conditions to be addressed/monitored: HTN  Recent office visits:  None  Recent consult visits:  03/03/21 Marzetta Board, DPM (Podiatry) - Patient presented for Pain due to onychomycosis of toenail and other concerns. No medication changes.  Hospital visits:  None in previous 6 months  Medications: Outpatient Encounter Medications as of 04/25/2021  Medication Sig   ALPRAZolam (XANAX) 1 MG tablet TAKE 1 TABLET BY MOUTH THREE TIMES A DAY AS NEEDED   apixaban (ELIQUIS) 5 MG TABS tablet Take 1 tablet (5 mg total) by mouth 2 (two) times daily.   b complex vitamins capsule Take 2 capsules by mouth daily.    benzonatate (TESSALON PERLES) 100 MG capsule Take 1 capsule (100 mg total) by mouth 3 (three) times daily as needed.   buPROPion (WELLBUTRIN XL) 300 MG 24 hr tablet TAKE 1 TABLET BY MOUTH EVERY DAY   chlorhexidine (PERIDEX) 0.12 % solution SMARTSIG:By Mouth   Cholecalciferol (VITAMIN D) 2000 UNITS CAPS Take 2,000 Int'l Units by mouth.   Coenzyme Q10-Fish Oil-Vit E (CO-Q 10 OMEGA-3 FISH OIL PO) Take 100 mg by mouth daily.    diltiazem (CARDIZEM CD) 120 MG 24 hr capsule Take 1 capsule (120 mg total) by mouth daily.   DULoxetine (CYMBALTA) 60 MG capsule Take 60 mg by mouth daily.   FLUAD QUADRIVALENT 0.5 ML injection    furosemide (LASIX) 20 MG tablet Take 1 tablet (20 mg total) by mouth daily.   gatifloxacin (ZYMAXID) 0.5 % SOLN Place 1 drop into the right eye in the morning, at noon, in the evening, and at bedtime.   losartan (COZAAR) 25 MG tablet TAKE 1 TABLET BY MOUTH DAILY.   Lysine 500 MG TABS Take 500 mg by mouth daily.   metoprolol succinate (TOPROL-XL) 25 MG 24 hr tablet Take 1 tablet (25 mg total) by mouth daily.   Multiple Vitamins-Minerals (PRESERVISION AREDS 2 PO) Take 1 capsule by mouth in the morning  and at bedtime.   pantoprazole (PROTONIX) 40 MG tablet Take 1 tablet (40 mg total) by mouth daily.   Pitavastatin Calcium (LIVALO) 2 MG TABS Take 1 tablet (2 mg total) by mouth daily.   SUTAB [phone] MG TABS Take 1 kit by mouth once.   valACYclovir (VALTREX) 1000 MG tablet TAKE 2 TABLETS AT ONSET OF COLD SORES AND REPEAT 2 IN 12 HOURS.   No facility-administered encounter medications on file as of 04/25/2021.  Reviewed chart prior to disease state call. Spoke with patient regarding BP  Recent Office Vitals: BP Readings from Last 3 Encounters:  08/20/20 110/68  08/13/20 (!) 117/51  06/21/20 130/64   Pulse Readings from Last 3 Encounters:  08/20/20 60  08/13/20 77  06/21/20 86    Wt Readings from Last 3 Encounters:  08/20/20 189 lb 3.2 oz (85.8 kg)  08/13/20 188 lb (85.3 kg)  06/21/20 193 lb 12.8 oz (87.9 kg)     Kidney Function Lab Results  Component Value Date/Time   CREATININE 1.10 (H) 03/12/2020 11:20 AM   CREATININE 1.18 (H) 08/23/2019 01:53 PM   CREATININE 1.36 (H) 04/06/2018 02:17 PM   CREATININE 1.11 (H) 06/04/2015 05:04 PM   CREATININE 1.3 (H) 05/07/2015 09:01 AM   CREATININE 1.1 06/26/2014 02:07 PM   GFR 47.96 (L) 05/31/2015 10:50 AM   GFRNONAA 45 (L) 08/23/2019 01:53 PM   GFRAA  52 (L) 08/23/2019 01:53 PM    BMP Latest Ref Rng & Units 03/12/2020 08/23/2019 04/06/2018  Glucose 65 - 99 mg/dL 125(H) 100(H) 137(H)  BUN 7 - 25 mg/dL _0 Creatinine 0.60 - 0.93 mg/dL 1.10(H) 1.18(H) 1.36(H)  BUN/Creat Ratio 6 - 22 (calc) 13 18 -  Sodium 135 - 146 mmol/L 143 143 136  Potassium 3.5 - 5.3 mmol/L 5.0 4.5 4.3  Chloride 98 - 110 mmol/L 106 108(H) 98  CO2 20 - 32 mmol/L _1 Calcium 8.6 - 10.4 mg/dL 9.3 8.9 9.7    Current antihypertensive regimen:  Losartan 25 mg 1 tablet daily Diltiazem 120 mg 1 capsule daily Metoprolol succinate 25 mg 1 tablet daily How often are you checking your Blood Pressure? Patient reports she still has not aquired a blood pressure  machine and does not have it checked anywhere other than the Doctors she denies any headaches or lightheadedness. She reports she is compliant with the use of her medications. Current home BP readings: none BP Readings from Last 3 Encounters:  08/20/20 110/68  08/13/20 (!) 117/51  06/21/20 130/64   What recent interventions/DTPs have been made by any provider to improve Blood Pressure control since last CPP Visit: Patient reports no changes. Any recent hospitalizations or ED visits since last visit with CPP? No Patient reports her husband is currently in the hospital and she is dealing with that. She reports other than her usual aches and pains with arthritis she thinks she is doing as well as she can. She reports no concerns or needs at this time.  Adherence Review: Is the patient currently on ACE/ARB medication? No Does the patient have >5 day gap between last estimated fill dates? No     Care Gaps: COVID Booster #4 Therapist, music) - Overdue Flu Vaccine - Overdue AWV - 02/07/21 CCM- Offered and Patient declined BP- 110/68 (5/23)  Star Rating Drugs: Pitavastatn Calcium (Livalo) 2 mg - [facility] Losartan (Cozaar) 25 mg - Last filled 03/20/21 90 DS at [facility] Pharmacist Assistant [phone]

## 2021-05-08 DIAGNOSIS — H353132 Nonexudative age-related macular degeneration, bilateral, intermediate dry stage: Secondary | ICD-10-CM | POA: Diagnosis not present

## 2021-05-08 DIAGNOSIS — H524 Presbyopia: Secondary | ICD-10-CM | POA: Diagnosis not present

## 2021-05-08 DIAGNOSIS — Z961 Presence of intraocular lens: Secondary | ICD-10-CM | POA: Diagnosis not present

## 2021-06-03 ENCOUNTER — Telehealth: Payer: Self-pay | Admitting: Cardiology

## 2021-06-03 NOTE — Telephone Encounter (Signed)
Patient returning call. She states she is having trouble with her phone so if she does not answer to leave a detailed voicemail on her home number. ?

## 2021-06-03 NOTE — Telephone Encounter (Signed)
Pt c/o medication issue: ? ?1. Name of Medication: Coenzyme Q10 Fish Oil 100 mg ? ?2. How are you currently taking this medication (dosage and times per day)? 1 time a day ? ?3. Are you having a reaction (difficulty breathing--STAT)?  ? ?4. What is your medication issue? When her son  went to the pharmacy he picked up 200 mg. Is 200 mg alright for her to take? ? ?

## 2021-06-03 NOTE — Telephone Encounter (Signed)
Ok to take CoQ10 '200mg'$  daily. Primary reason for taking would be if it's helped her to tolerate her statin (Livalo) better. ?

## 2021-06-03 NOTE — Telephone Encounter (Signed)
Left message for patient to call back  

## 2021-06-03 NOTE — Telephone Encounter (Signed)
Spoke with the patient and advised her that CoQ10 200 mg is okay for her to take. Patient verbalized understanding.  ?

## 2021-06-06 ENCOUNTER — Other Ambulatory Visit: Payer: Self-pay

## 2021-06-06 ENCOUNTER — Encounter: Payer: Self-pay | Admitting: Podiatry

## 2021-06-06 ENCOUNTER — Ambulatory Visit: Payer: Medicare Other | Admitting: Podiatry

## 2021-06-06 DIAGNOSIS — D689 Coagulation defect, unspecified: Secondary | ICD-10-CM

## 2021-06-06 DIAGNOSIS — M79676 Pain in unspecified toe(s): Secondary | ICD-10-CM

## 2021-06-06 DIAGNOSIS — B351 Tinea unguium: Secondary | ICD-10-CM | POA: Diagnosis not present

## 2021-06-06 DIAGNOSIS — L84 Corns and callosities: Secondary | ICD-10-CM

## 2021-06-09 ENCOUNTER — Telehealth: Payer: Self-pay | Admitting: Family Medicine

## 2021-06-09 NOTE — Telephone Encounter (Signed)
Left message for patient to call back and schedule Medicare Annual Wellness Visit (AWV) either virtually or in office. Left  my [name] number [phone] ? ? ?Last AWV ;01/25/20 ?please schedule at anytime with [facility] Nurse Health Advisor 1 or 2 ? ? ?This should be a 45 minute visit.  ?

## 2021-06-14 ENCOUNTER — Other Ambulatory Visit: Payer: Self-pay | Admitting: Family Medicine

## 2021-06-14 DIAGNOSIS — F324 Major depressive disorder, single episode, in partial remission: Secondary | ICD-10-CM

## 2021-06-15 NOTE — Progress Notes (Signed)
?  Subjective:  ?Patient ID: [name], female    DOB: [birth year],  MRN: [id number] ? ?[name] presents to clinic today for at risk foot care with h/o clotting disorder and preulcerative corn(s) b/l lower extremities and painful thick toenails that are difficult to trim. Painful toenails interfere with ambulation. Aggravating factors include wearing enclosed shoe gear. Pain is relieved with periodic professional debridement. Painful corns are aggravated when weightbearing when wearing enclosed shoe gear. Pain is relieved with periodic professional debridement. ? ?Patient states she has lost her husband since our last visit ? ?New problem(s): None.  ? ?PCP is [name], MD , and last visit was June 21, 2020. ? ?Allergies  ?Allergen Reactions  ? Chlorhexidine Base Itching  ?  CHG WIPES  ? Clindamycin/Lincomycin Other (See Comments)  ?  Caused fire like sensation in her stomach   ? Sulfa Antibiotics Other (See Comments)  ?  Severe GI upset  ? Penicillins Hives and Rash  ?  DID THE REACTION INVOLVE: Swelling of the face/tongue/throat, SOB, or low BP? No ?Sudden or severe rash/hives, skin peeling, or the inside of the mouth or nose? No ?Did it require medical treatment? No ?When did it last happen?  90s ?If all above answers are ?NO?, may proceed with cephalosporin use. ?  ? ? ?Review of Systems: Negative except as noted in the HPI. ? ?Objective: No changes noted in today's physical examination. ?General: Patient is a pleasant 76 y.o. Caucasian female obese in NAD. AAO x 3.  ? ?Neurovascular Examination: ?CFT immediate b/l LE. Palpable DP/PT pulses b/l LE. Digital hair sparse b/l. Skin temperature gradient WNL b/l. No pain with calf compression b/l. No edema noted b/l. No cyanosis or clubbing noted b/l LE. ? ?Protective sensation intact 5/5 intact bilaterally with 10g monofilament b/l. Vibratory sensation intact b/l. ? ?Dermatological:  ?Pedal integument with normal turgor, texture and tone BLE.  No open wounds b/l LE. No interdigital macerations noted b/l LE. Toenails 1-5 b/l elongated, discolored, dystrophic, thickened, crumbly with subungual debris and tenderness to dorsal palpation. Preulcerative lesion noted L 3rd toe, R 3rd toe, and R 4th toe. There is visible subdermal hemorrhage. There is no surrounding erythema, no edema, no drainage, no odor, no fluctuance. ? ?Musculoskeletal:  ?Normal muscle strength 5/5 to all lower extremity muscle groups bilaterally. Adductovarus deformity L 3rd toe, L 4th toe, L 5th toe, R 4th toe, and R 5th toe.Marland Kitchen No pain, crepitus or joint limitation noted with ROM b/l LE.  Patient ambulates independently without assistive aids. ? ?Assessment/Plan: ?1. Pain due to onychomycosis of toenail   ?2. Pre-ulcerative corn or callous   ?3. Clotting disorder ([place])   ? ?-Consent given for treatment as described below: ?-Patient to continue soft, supportive shoe gear daily. ?-Toenails 1-5 b/l were debrided in length and girth with sterile nail nippers and dremel without iatrogenic bleeding.  ?-Preulcerative lesion pared bilateral 3rd toes and R 4th toe. Total number pared=3. ?-Patient/POA to call should there be question/concern in the interim.  ? ?Return in about 10 weeks (around 08/15/2021). ? ?Marzetta Board, DPM  ?

## 2021-06-17 ENCOUNTER — Telehealth: Payer: Self-pay | Admitting: Cardiology

## 2021-06-17 NOTE — Telephone Encounter (Signed)
Spoke with the patient and gave her advise from Karren Cobble, [facility]. Patient verbalized understanding.  ?

## 2021-06-17 NOTE — Telephone Encounter (Signed)
It is possible she can have increased bleeding but is not a major issue with Eliquis.  Precautions are more for the supplement and warfarin. ?

## 2021-06-17 NOTE — Telephone Encounter (Signed)
? ?  Pt c/o medication issue: ? ?1. Name of Medication: nutrafol hair growth suppliment ? ?2. How are you currently taking this medication (dosage and times per day)?  ? ?3. Are you having a reaction (difficulty breathing--STAT)?  ? ?4. What is your medication issue? Pt said, she is having problem loosing her hair and she saw this on TV advertising so she ordered one, while is is reading the label it say to contact doctor if she is on blood thinner. Pt is taking eliquis. She said she faxed the information to Dr. Radford Pax last week and following up and wanted to know if this is something she can take  ?

## 2021-06-23 ENCOUNTER — Other Ambulatory Visit: Payer: Self-pay | Admitting: Cardiology

## 2021-06-23 DIAGNOSIS — I4821 Permanent atrial fibrillation: Secondary | ICD-10-CM

## 2021-06-23 DIAGNOSIS — Z5181 Encounter for therapeutic drug level monitoring: Secondary | ICD-10-CM

## 2021-06-23 DIAGNOSIS — I824Y9 Acute embolism and thrombosis of unspecified deep veins of unspecified proximal lower extremity: Secondary | ICD-10-CM

## 2021-06-23 NOTE — Telephone Encounter (Addendum)
Pt last saw Dr Radford Pax 08/20/20, last labs 03/12/20 Creat 1.10, pt is overdue for labwork.   Pt's PCP is at [facility] so most recent labs should be in [place].  Sent message to schedulers to call pt to schedule yearly follow-up with Dr Radford Pax in May 2023 qand will place note on appt pt needs labwork as well at [place].  Will await appt to be made.  ?

## 2021-06-23 NOTE — Telephone Encounter (Signed)
Pt was made a follow-up appt with Dr Radford Pax on 10/07/21. Will place note on appt pt will need labwork done as well at [place]. Orders for CBC and CMP in Epic as well as labappt made for same day as Dr Radford Pax appt.  Will refill rx to get pt to upcoming appt/ewb ?Last Creat 1.10, age 76, weight 85.8kg, based on specified criteria pt is on appropriate dosage of Eliquis '5mg'$  BID for afib.  Will refill rx.  ?

## 2021-06-24 ENCOUNTER — Other Ambulatory Visit: Payer: Self-pay | Admitting: Family Medicine

## 2021-07-01 ENCOUNTER — Telehealth: Payer: Self-pay | Admitting: Family Medicine

## 2021-07-01 MED ORDER — VALACYCLOVIR HCL 1 G PO TABS
ORAL_TABLET | ORAL | 0 refills | Status: DC
Start: 2021-07-01 — End: 2022-04-03

## 2021-07-01 NOTE — Telephone Encounter (Signed)
Rx sent 

## 2021-07-01 NOTE — Telephone Encounter (Signed)
Pt called stating that she would need a refill for Valacyclovir (valtrex) sent to [place], [place] ? ?Please advise.  ?

## 2021-07-08 ENCOUNTER — Other Ambulatory Visit: Payer: Self-pay | Admitting: Family Medicine

## 2021-07-09 ENCOUNTER — Ambulatory Visit (INDEPENDENT_AMBULATORY_CARE_PROVIDER_SITE_OTHER): Payer: Medicare Other

## 2021-07-09 VITALS — Ht 66.0 in | Wt 180.0 lb

## 2021-07-09 DIAGNOSIS — Z0000 Encounter for general adult medical examination without abnormal findings: Secondary | ICD-10-CM | POA: Diagnosis not present

## 2021-07-09 NOTE — Progress Notes (Signed)
? ?Subjective:  ? [name] is a 76 y.o. female who presents for Medicare Annual (Subsequent) preventive examination. ? ?Review of Systems    ?Virtual Visit via Telephone Note ? ?I connected with  [name] on 07/09/21 at  3:45 PM EDT by telephone and verified that I am speaking with the correct person using two identifiers. ? ?Location: ?Patient: Home ?Provider: Office ?Persons participating in the virtual visit: patient/Nurse Health Advisor ?  ?I discussed the limitations, risks, security and privacy concerns of performing an evaluation and management service by telephone and the availability of in person appointments. The patient expressed understanding and agreed to proceed. ? ?Interactive audio and video telecommunications were attempted between this nurse and patient, however failed, due to patient having technical difficulties OR patient did not have access to video capability.  We continued and completed visit with audio only. ? ?Some vital signs may be absent or patient reported.  ? ?Angela Peaches, LPN  ?Cardiac Risk Factors include: advanced age (>58mn, >>63women) ? ?   ?Objective:  ?  ?Today's Vitals  ? 07/09/21 1601  ?Weight: 180 lb (81.6 kg)  ?Height: _0  (1.676 m)  ? ?Body mass index is 29.05 kg/m?. ? ? ?  07/09/2021  ?  4:14 PM 03/25/2020  ?  6:01 PM 01/25/2020  ?  1:53 PM 04/06/2018  ? 12:52 PM 03/31/2018  ? 10:28 AM 04/16/2017  ?  3:59 PM 07/11/2015  ? 12:11 PM  ?Advanced Directives  ?Does Patient Have a Medical Advance Directive? Yes No _1   ?Type of AParamed[place]of [place] will  [place] will Living will;Healthcare Power of [place] will  Healthcare Power of Attorney  ?Does patient want to make changes to medical advance directive? No - Patient declined  No - Patient declined  No - Patient declined    ?Copy of [place]in Chart? No - copy requested  No -  copy requested No - copy requested No - copy requested  No - copy requested  ?Would patient like information on creating a medical advance directive?  No - Patient declined       ? ? ?Current Medications (verified) ?Outpatient Encounter Medications as of 07/09/2021  ?Medication Sig  ? ALPRAZolam (XANAX) 1 MG tablet TAKE 1 TABLET BY MOUTH THREE TIMES A DAY AS NEEDED  ? apixaban (ELIQUIS) 5 MG TABS tablet Take 1 tablet (5 mg total) by mouth 2 (two) times daily. OVERDUE for follow-up and labwork, MUST see MD for FUTURE refills.  ? b complex vitamins capsule Take 2 capsules by mouth daily.   ? benzonatate (TESSALON PERLES) 100 MG capsule Take 1 capsule (100 mg total) by mouth 3 (three) times daily as needed.  ? buPROPion (WELLBUTRIN XL) 300 MG 24 hr tablet TAKE 1 TABLET BY MOUTH EVERY DAY  ? chlorhexidine (PERIDEX) 0.12 % solution SMARTSIG:By Mouth  ? Cholecalciferol (VITAMIN D) 2000 UNITS CAPS Take 2,000 Int'l Units by mouth.  ? Coenzyme Q10-Fish Oil-Vit E (CO-Q 10 OMEGA-3 FISH OIL PO) Take 100 mg by mouth daily.   ? diltiazem (CARDIZEM CD) 120 MG 24 hr capsule Take 1 capsule (120 mg total) by mouth daily.  ? DULoxetine (CYMBALTA) 60 MG capsule Take 60 mg by mouth daily.  ? FLUAD QUADRIVALENT 0.5 ML injection   ? furosemide (LASIX) 20 MG tablet Take 1 tablet (20 mg total) by mouth daily.  ? gatifloxacin (ZYMAXID) 0.5 %  SOLN Place 1 drop into the right eye in the morning, at noon, in the evening, and at bedtime.  ? losartan (COZAAR) 25 MG tablet TAKE 1 TABLET BY MOUTH DAILY.  ? Lysine 500 MG TABS Take 500 mg by mouth daily.  ? metoprolol succinate (TOPROL-XL) 25 MG 24 hr tablet Take 1 tablet (25 mg total) by mouth daily.  ? Multiple Vitamins-Minerals (PRESERVISION AREDS 2 PO) Take 1 capsule by mouth in the morning and at bedtime.  ? pantoprazole (PROTONIX) 40 MG tablet Take 1 tablet (40 mg total) by mouth daily.  ? Pitavastatin Calcium (LIVALO) 2 MG TABS Take 1 tablet (2 mg total) by mouth daily.  ? SUTAB [phone] MG  TABS Take 1 kit by mouth once.  ? valACYclovir (VALTREX) 1000 MG tablet TAKE 2 TABLETS AT ONSET OF COLD SORES AND REPEAT 2 IN 12 HOURS.  ? ?No facility-administered encounter medications on file as of 07/09/2021.  ? ? ?Allergies (verified) ?Chlorhexidine base, Clindamycin/lincomycin, Sulfa antibiotics, and Penicillins  ? ?History: ?Past Medical History:  ?Diagnosis Date  ? ABDOMINAL PAIN RIGHT UPPER QUADRANT 11/08/2008  ? ALLERGIC RHINITIS 08/01/2009  ? Anxiety   ? Aortic stenosis   ? mild by echo 08/2020  ? Cataract   ? Complication of anesthesia 01/31/2015  ? ? doesn't remember anything after being taken to pre surgery  ? Coronary artery calcification seen on CAT scan 05/30/2009  ? moderate risk coronary calcium score by chest CT  ? DDD (degenerative disc disease), lumbar   ? DEPRESSION 06/13/2008  ? DYSLIPIDEMIA 02/27/2009  ? DYSPNEA 02/27/2009  ? Dysrhythmia   ? Afib  ? Frequency of urination   ? GERD (gastroesophageal reflux disease)   ? "years ago"  ? Head injury 2016  ? did not loose consciousness  ? History of Bell's palsy   ? History of DVT of lower extremity 2006-- CHRONIC COUMADIN THERAPY  ? History of hiatal hernia   ? History of Lyme disease   ? Hydronephrosis, left chronic -- secondary to retroperitoneal fibrosis  ? Hypertension   ? Lyme disease   ? NON-HODGKIN'S LYMPHOMA, HX OF dx  2005--  chemoradiation completed 2006--  no recurrence  ? onocologist- dr odogwu--   ? OSTEOARTHRITIS, MODERATE 04/07/2010  ? Permanent atrial fibrillation ([place])   ? On chronic anti-coagulation with warfarin  ? Pneumonia   ? x 3 - last time 02/2018  ? Retroperitoneal fibrosis   ? Stroke [facility])   ? ? - [place]- "you have had 3 strokes"  ? ?Past Surgical History:  ?Procedure Laterality Date  ? BREAST MASS EXCISION    ? BENIGN-- RIGHT BREAST  ? CARDIOVASCULAR STRESS TEST  04-04-2009-- PER PT ASYMPTOMATIC-- MEDICAL MANAGEMENT  ? STUDY WAS EQUIVOCAL/ EF 49%/ GLOBAL HYPOKINESIS/ APPEARS TO BE A MILD ANTERIOR PERFUSION DEFECT COULD  REPRESENT ISCHEMIA BUT DUE TO  ACTIVITY WORSE IN STRESS THAN AT REST POSS. THE DEFECT WAS ARTIFACTUAL  ? CARDIOVERSION N/A 07/11/2015  ? Procedure: CARDIOVERSION;  Surgeon: Larey Dresser, MD;  Location: [place];  Service: Cardiovascular;  Laterality: N/A;  ? CT ANGIOGRAM  FEB 2011  ? CALCIUM SCORE 161/ POSS. MODERATE MID LAD STENOSIS  ? CYSTOSCOPY W/ RETROGRADES  04/28/2011  ? Procedure: CYSTOSCOPY WITH RETROGRADE PYELOGRAM;  Surgeon: Fredricka Bonine, MD;  Location[facility];  Service: Urology;  Laterality: Left;  ? CYSTOSCOPY W/ URETERAL STENT REMOVAL  04/28/2011  ? Procedure: CYSTOSCOPY WITH STENT REMOVAL;  Surgeon: Fredricka Bonine, MD;  Location:  [place];  Service: Urology;;  ? EXPLORATORY LAPAROTOMY  2005  ? LYMPHOMA  ? EYE SURGERY Bilateral   ? cataract   ? GAS INSERTION Right 03/31/2018  ? Procedure: RIGHT EYE INSERTION OF GAS C3F8;  Surgeon: Bernarda Caffey, MD;  Location: [place];  Service: Ophthalmology;  Laterality: Right;  ? KNEE ARTHROSCOPY  2005  ? RIGHT  ? LASER PHOTO ABLATION Right 03/31/2018  ? Procedure: RIGHT EYE LASER PHOTO ABLATION;  Surgeon: Bernarda Caffey, MD;  Location: [place];  Service: Ophthalmology;  Laterality: Right;  ? MULTIPLE CYSTO/ LEFT URETERAL STENT EXCHANGES  LAST ONE 10-28-10  ? RETROPERITONEAL BX  2007  ? REVERSE SHOULDER ARTHROPLASTY Right 01/31/2015  ? Procedure: RIGHT REVERSE SHOULDER ARTHROPLASTY;  Surgeon: Justice Britain, MD;  Location: [place];  Service: Orthopedics;  Laterality: Right;  ? TONSILLECTOMY  CHILD  ? TOTAL KNEE ARTHROPLASTY  OCT 2010  ? RIGHT  ? TOTAL KNEE ARTHROPLASTY  JAN 2010  ? LEFT  ? TRANSTHORACIC ECHOCARDIOGRAM  05-03-2009  ? MILD SYSTOLIC DYSFUNCTION/ EF 06-00%/ MODERATE DIASTOLIC DYSFUCTION/ MILD MR/ MILD BIATRIAL ENLARGEMENT  ? VITRECTOMY 25 GAUGE WITH SCLERAL BUCKLE Right 03/31/2018  ? Procedure: RIGHT VITRECTOMY 25 GAUGE WITH SCLERAL BUCKLE;  Surgeon: Bernarda Caffey, MD;  Location: [place];  Service:  Ophthalmology;  Laterality: Right;  ? ?Family History  ?Problem Relation Age of Onset  ? Arthritis Mother   ? Heart disease Mother   ? Heart attack Mother   ? Diabetes Sister   ? Hypertension Sister   ? Stroke Sister

## 2021-07-09 NOTE — Patient Instructions (Addendum)
?Ms. Burkle , ?Thank you for taking time to come for your Medicare Wellness Visit. I appreciate your ongoing commitment to your health goals. Please review the following plan we discussed and let me know if I can assist you in the future.  ? ?These are the goals we discussed: ? Goals   ? ?   Exercise 3x per week (30 min per time)   ?   Patient Stated (pt-stated)   ?   Will remain healthy ?Continue to be able to live independently.  ?  ?   Pharmacy care plan   ?   CARE PLAN ENTRY ?(see longitudinal plan of care for additional care plan information) ? ?Current Barriers:  ?Chronic Disease Management support, education, and care coordination needs related to Hypertension, Hyperlipidemia, Atrial Fibrillation, Depression, and Anxiety ?  ?Hypertension ?BP Readings from Last 3 Encounters:  ?03/11/20 118/78  ?08/23/19 107/62  ?08/10/19 130/72  ?Pharmacist Clinical Goal(s): ?Over the next 90 days, patient will work with PharmD and providers to maintain BP goal <140/90 ?Current regimen:  ?Losartan 25 mg 1 tablet daily ?Diltiazem 120 mg 1 capsule daily ?Metoprolol succinate 25 mg 1 tablet daily ?Interventions: ?Discussed DASH eating plan recommendations: ?Emphasizes vegetables, fruits, and whole-grains ?Includes fat-free or low-fat dairy products, fish, poultry, beans, nuts, and vegetable oils ?Limits foods that are high in saturated fat. These foods include fatty meats, full-fat dairy products, and tropical oils such as coconut, palm kernel, and palm oils. ?Limits sugar-sweetened beverages and sweets ?Limiting sodium intake to < 1500 mg/day ?Patient self care activities - Over the next 90 days, patient will: ?Check BP weekly, document, and provide at future appointments ?Ensure daily salt intake < 2300 mg/day ? ?Hyperlipidemia ?Lab Results  ?Component Value Date/Time  ? [place] 57 08/23/2019 01:53 PM  ?Pharmacist Clinical Goal(s): ?Over the next 90 days, patient will work with PharmD and providers to maintain LDL goal <  70 ?Current regimen:  ?Pitavastatin 2 mg 1 tablet daily ?COQ10 & fish oil daily ?Interventions: ?Discussed individual goals for cholesterol levels ?Patient self care activities - Over the next 90 days, patient will: ?Continue current medications ? ?AFib ?Pulse Readings from Last 3 Encounters:  ?03/11/20 80  ?08/23/19 (!) 59  ?08/10/19 66  ?Pharmacist Clinical Goal(s): ?Over the next 90 days, patient will work with PharmD and providers to maintain HR < 110 beats per minute ?Current regimen:  ?Diltiazem 120 mg 1 capsule daily ?Eliquis 5 mg 1 tablet twice daily ?Metoprolol succinate 25 mg 1 tablet daily ?Interventions: ?Discussed monitoring for signs of bleeding such as unexplained and excessive bleeding from a cut or injury, easy or excessive bruising, blood in urine or stools, and nosebleeds without a known cause ?Patient self care activities - Over the next 90 days, patient will: ?Continue current medications ? ?Depression/anxiety ?Pharmacist Clinical Goal(s) ?Over the next 90 days, patient will work with PharmD and providers to manage symptoms of depression and anxiety ?Current regimen:  ?Bupropion XL 300 mg 1 tablet daily ?Alprazolam 1 mg 1 tablet three times daily as needed ?Interventions: ?Discussed limiting the use of alprazolam and long term risks of taking such as falls, fractures, and memory loss ?Patient self care activities - Over the next 90 days, patient will: ?Continue current medications ? ?Medication management ?Pharmacist Clinical Goal(s): ?Over the next 90 days, patient will work with PharmD and providers to maintain optimal medication adherence ?[facility] ?Interventions ?Comprehensive medication review performed. ?Continue current medication management strategy ?Patient self care activities - Over the  next 90 days, patient will: ?Take medications as prescribed ?Report any questions or concerns to PharmD and/or provider(s) ? ?Initial goal documentation  ?  ? ?  ?  ?This is a list of the  screening recommended for you and due dates:  ?Health Maintenance  ?Topic Date Due  ? Flu Shot  10/21/2021  ? Tetanus Vaccine  07/26/2026  ? Pneumonia Vaccine  Completed  ? DEXA scan (bone density measurement)  Completed  ? COVID-19 Vaccine  Completed  ? Hepatitis C Screening: USPSTF Recommendation to screen - Ages 70-79 yo.  Completed  ? Zoster (Shingles) Vaccine  Completed  ? HPV Vaccine  Aged Out  ? Colon Cancer Screening  Discontinued  ?  ?Advanced directives: Yes  ? ?Conditions/risks identified: None ? ?Next appointment: Follow up in one year for your annual wellness visit  ? ? ?Preventive Care 45 Years and Older, Female ?Preventive care refers to lifestyle choices and visits with your health care provider that can promote health and wellness. ?What does preventive care include? ?A yearly physical exam. This is also called an annual well check. ?Dental exams once or twice a year. ?Routine eye exams. Ask your health care provider how often you should have your eyes checked. ?Personal lifestyle choices, including: ?Daily care of your teeth and gums. ?Regular physical activity. ?Eating a healthy diet. ?Avoiding tobacco and drug use. ?Limiting alcohol use. ?Practicing safe sex. ?Taking low-dose aspirin every day. ?Taking vitamin and mineral supplements as recommended by your health care provider. ?What happens during an annual well check? ?The services and screenings done by your health care provider during your annual well check will depend on your age, overall health, lifestyle risk factors, and family history of disease. ?Counseling  ?Your health care provider may ask you questions about your: ?Alcohol use. ?Tobacco use. ?Drug use. ?Emotional well-being. ?Home and relationship well-being. ?Sexual activity. ?Eating habits. ?History of falls. ?Memory and ability to understand (cognition). ?Work and work Statistician. ?Reproductive health. ?Screening  ?You may have the following tests or measurements: ?Height,  weight, and BMI. ?Blood pressure. ?Lipid and cholesterol levels. These may be checked every 5 years, or more frequently if you are over 24 years old. ?Skin check. ?Lung cancer screening. You may have this screening every year starting at age 27 if you have a 30-pack-year history of smoking and currently smoke or have quit within the past 15 years. ?Fecal occult blood test (FOBT) of the stool. You may have this test every year starting at age 18. ?Flexible sigmoidoscopy or colonoscopy. You may have a sigmoidoscopy every 5 years or a colonoscopy every 10 years starting at age 41. ?Hepatitis C blood test. ?Hepatitis B blood test. ?Sexually transmitted disease (STD) testing. ?Diabetes screening. This is done by checking your blood sugar (glucose) after you have not eaten for a while (fasting). You may have this done every 1-3 years. ?Bone density scan. This is done to screen for osteoporosis. You may have this done starting at age 23. ?Mammogram. This may be done every 1-2 years. Talk to your health care provider about how often you should have regular mammograms. ?Talk with your health care provider about your test results, treatment options, and if necessary, the need for more tests. ?Vaccines  ?Your health care provider may recommend certain vaccines, such as: ?Influenza vaccine. This is recommended every year. ?Tetanus, diphtheria, and acellular pertussis (Tdap, Td) vaccine. You may need a Td booster every 10 years. ?Zoster vaccine. You may need this after age 35. ?  Pneumococcal 13-valent conjugate (PCV13) vaccine. One dose is recommended after age 76. ?Pneumococcal polysaccharide (PPSV23) vaccine. One dose is recommended after age 85. ?Talk to your health care provider about which screenings and vaccines you need and how often you need them. ?This information is not intended to replace advice given to you by your health care provider. Make sure you discuss any questions you have with your health care  provider. ?Document Released: 04/05/2015 Document Revised: 11/27/2015 Document Reviewed: 01/08/2015 ?Elsevier Interactive Patient Education ? 2017 [place]. ? ?Fall Prevention in the Home ?Falls can cause injuries. They

## 2021-07-24 ENCOUNTER — Telehealth: Payer: Self-pay | Admitting: Family Medicine

## 2021-07-24 MED ORDER — ALPRAZOLAM 1 MG PO TABS: 1.0000 mg | ORAL_TABLET | Freq: Three times a day (TID) | ORAL | 3 refills | Status: DC | PRN

## 2021-07-24 NOTE — Telephone Encounter (Signed)
Pt is calling and she is [facility] and would like to come pick up written rx for alprazolam ?

## 2021-07-24 NOTE — Telephone Encounter (Signed)
Pt informed of the message and verbalized understanding,  Pt would like rx [facility] to [facility] ?

## 2021-07-25 MED ORDER — ALPRAZOLAM 1 MG PO TABS: 1.0000 mg | ORAL_TABLET | Freq: Three times a day (TID) | ORAL | 3 refills | Status: DC | PRN

## 2021-07-25 NOTE — Telephone Encounter (Signed)
Prescription sent

## 2021-07-26 ENCOUNTER — Other Ambulatory Visit: Payer: Self-pay | Admitting: Cardiology

## 2021-08-22 ENCOUNTER — Ambulatory Visit: Payer: Medicare Other | Admitting: Podiatry

## 2021-08-22 ENCOUNTER — Encounter: Payer: Self-pay | Admitting: Podiatry

## 2021-08-22 DIAGNOSIS — B351 Tinea unguium: Secondary | ICD-10-CM

## 2021-08-22 DIAGNOSIS — D689 Coagulation defect, unspecified: Secondary | ICD-10-CM | POA: Diagnosis not present

## 2021-08-22 DIAGNOSIS — M79675 Pain in left toe(s): Secondary | ICD-10-CM

## 2021-08-22 DIAGNOSIS — M79674 Pain in right toe(s): Secondary | ICD-10-CM | POA: Diagnosis not present

## 2021-08-22 DIAGNOSIS — L84 Corns and callosities: Secondary | ICD-10-CM | POA: Diagnosis not present

## 2021-08-28 NOTE — Progress Notes (Signed)
  Subjective:  Patient ID: [name], female    DOB: [birth year],  MRN: [id number]  [name] presents to clinic today for at risk foot care with h/o clotting disorder and corn(s) b/l lower extremities and painful thick toenails that are difficult to trim. Painful toenails interfere with ambulation. Aggravating factors include wearing enclosed shoe gear. Pain is relieved with periodic professional debridement. Painful corns are aggravated when weightbearing when wearing enclosed shoe gear. Pain is relieved with periodic professional debridement.  New problem(s): None.   PCP is [name], MD , and last visit was 2022.  Allergies  Allergen Reactions   Chlorhexidine Base Itching    CHG WIPES   Clindamycin/Lincomycin Other (See Comments)    Caused fire like sensation in her stomach    Sulfa Antibiotics Other (See Comments)    Severe GI upset   Penicillins Hives and Rash    DID THE REACTION INVOLVE: Swelling of the face/tongue/throat, SOB, or low BP? No Sudden or severe rash/hives, skin peeling, or the inside of the mouth or nose? No Did it require medical treatment? No When did it last happen?  90s If all above answers are "NO", may proceed with cephalosporin use.     Review of Systems: Negative except as noted in the HPI.  ObObjective: No changes noted in today's physical examination. General: Patient is a pleasant 76 y.o. Caucasian female obese in NAD. AAO x 3.   Neurovascular Examination: CFT immediate b/l LE. Palpable DP/PT pulses b/l LE. Digital hair sparse b/l. Skin temperature gradient WNL b/l. No pain with calf compression b/l. No edema noted b/l. No cyanosis or clubbing noted b/l LE.  Protective sensation intact 5/5 intact bilaterally with 10g monofilament b/l. Vibratory sensation intact b/l.  Dermatological:  Pedal integument with normal turgor, texture and tone BLE. No open wounds b/l LE. No interdigital macerations noted b/l LE. Toenails 1-5 b/l  elongated, discolored, dystrophic, thickened, crumbly with subungual debris and tenderness to dorsal palpation. Preulcerative lesion noted bilateral 2nd toes and bilateral 3rd toes. There is visible subdermal hemorrhage. There is no surrounding erythema, no edema, no drainage, no odor, no fluctuance.  Musculoskeletal:  Normal muscle strength 5/5 to all lower extremity muscle groups bilaterally. Adductovarus deformity L 3rd toe, L 4th toe, L 5th toe, R 4th toe, and R 5th toe.Marland Kitchen No pain, crepitus or joint limitation noted with ROM b/l LE.  Patient ambulates independently without assistive aids.  Assessment/Plan: 1. Pain due to onychomycosis of toenail   2. Pre-ulcerative corn or callous   3. Clotting disorder [facility])      -Patient was evaluated and treated. All patient's and/or POA's questions/concerns answered on today's visit. -Mycotic toenails 1-5 bilaterally were debrided in length and girth with sterile nail nippers and dremel without incident. -Corn(s) bilateral 2nd toes and bilateral 3rd toes pared utilizing sterile scalpel blade without complication or incident. Total number debrided=4. -Dispensed tubefoam. Apply to bilateral 2nd toes and bilateral 3rd toes every morning. Remove every evening. -Patient/POA to call should there be question/concern in the interim.   Return in about 9 weeks (around 10/24/2021).  Marzetta Board, DPM

## 2021-09-03 ENCOUNTER — Other Ambulatory Visit: Payer: Self-pay | Admitting: Cardiology

## 2021-09-03 DIAGNOSIS — I4819 Other persistent atrial fibrillation: Secondary | ICD-10-CM

## 2021-09-20 ENCOUNTER — Other Ambulatory Visit: Payer: Self-pay | Admitting: Cardiology

## 2021-09-26 ENCOUNTER — Other Ambulatory Visit: Payer: Self-pay

## 2021-09-26 MED ORDER — FUROSEMIDE 20 MG PO TABS: 20.0000 mg | ORAL_TABLET | Freq: Every day | ORAL | 0 refills | Status: DC

## 2021-09-30 ENCOUNTER — Other Ambulatory Visit: Payer: Self-pay

## 2021-09-30 DIAGNOSIS — F324 Major depressive disorder, single episode, in partial remission: Secondary | ICD-10-CM

## 2021-09-30 MED ORDER — BUPROPION HCL ER (XL) 300 MG PO TB24: 300.0000 mg | ORAL_TABLET | Freq: Every day | ORAL | 0 refills | Status: DC

## 2021-10-02 ENCOUNTER — Telehealth: Payer: Self-pay | Admitting: Pharmacist

## 2021-10-02 NOTE — Chronic Care Management (AMB) (Unsigned)
Chronic Care Management Pharmacy Assistant   Name: Tysha Grismore  MRN: [id number] DOB: 1945-07-26  Reason for Encounter: General Assessment per MP   Recent office visits:  07/09/21 Criselda Peaches, LPN - Patient presented for Medicare annual wellness exam. No medication changes.  Recent consult visits:   10/07/21 Sueanne Margarita, MD (Cardiology) - Patient presented for Coronary Artery calcification seen on CAT scan and other concerns. Stopped Benzonatate. Stopped Gatifloxacin. Stopped Losartan. Stopped Multivitamins. Stopped Sodium Sulfate.  08/22/21 Marzetta Board, DPM - Patient presented for Pain due to onychomycosis of toenail and other concerns. No medication changes Tubefoam dispensed for toes..  06/06/21 Marzetta Board, DPM - Patient presented for Pain due to onychomycosis of toenail and other concerns. No medication changes.  05/08/21 Sharyne Peach (Ophthalmology) - Claims encounter for nonexudative age related macular degeneration bilateral, intermediate dry stage and other concerns. No other visit details available.  Hospital visits:  None in previous 6 months  Medications: Outpatient Encounter Medications as of 10/02/2021  Medication Sig   ALPRAZolam (XANAX) 1 MG tablet Take 1 tablet (1 mg total) by mouth 3 (three) times daily as needed.   apixaban (ELIQUIS) 5 MG TABS tablet Take 1 tablet (5 mg total) by mouth 2 (two) times daily. OVERDUE for follow-up and labwork, MUST see MD for FUTURE refills.   b complex vitamins capsule Take 2 capsules by mouth daily.    benzonatate (TESSALON PERLES) 100 MG capsule Take 1 capsule (100 mg total) by mouth 3 (three) times daily as needed.   buPROPion (WELLBUTRIN XL) 300 MG 24 hr tablet Take 1 tablet (300 mg total) by mouth daily.   chlorhexidine (PERIDEX) 0.12 % solution SMARTSIG:By Mouth   Cholecalciferol (VITAMIN D) 2000 UNITS CAPS Take 2,000 Int'l Units by mouth.   Coenzyme Q10-Fish Oil-Vit E (CO-Q 10 OMEGA-3 FISH OIL  PO) Take 100 mg by mouth daily.    diltiazem (CARDIZEM CD) 120 MG 24 hr capsule Take 1 capsule (120 mg total) by mouth daily.   DULoxetine (CYMBALTA) 60 MG capsule Take 60 mg by mouth daily.   FLUAD QUADRIVALENT 0.5 ML injection    furosemide (LASIX) 20 MG tablet Take 1 tablet (20 mg total) by mouth daily.   gatifloxacin (ZYMAXID) 0.5 % SOLN Place 1 drop into the right eye in the morning, at noon, in the evening, and at bedtime.   losartan (COZAAR) 25 MG tablet TAKE 1 TABLET BY MOUTH DAILY.   Lysine 500 MG TABS Take 500 mg by mouth daily.   metoprolol succinate (TOPROL-XL) 25 MG 24 hr tablet TAKE 1 TABLET (25 MG TOTAL) BY MOUTH DAILY.   Multiple Vitamins-Minerals (PRESERVISION AREDS 2 PO) Take 1 capsule by mouth in the morning and at bedtime.   pantoprazole (PROTONIX) 40 MG tablet Take 1 tablet (40 mg total) by mouth daily.   Pitavastatin Calcium (LIVALO) 2 MG TABS Take 1 tablet (2 mg total) by mouth daily. Please keep upcoming appointment in July 2023 for future refills. Thank you   SUTAB [phone] MG TABS Take 1 kit by mouth once.   valACYclovir (VALTREX) 1000 MG tablet TAKE 2 TABLETS AT ONSET OF COLD SORES AND REPEAT 2 IN 12 HOURS.   No facility-administered encounter medications on file as of 10/02/2021.   [place] for [employer] Review Call  Adherence Review:  Does the Clinical Pharmacist Assistant have access to adherence rates? Yes Adherence rates for STAR metric medications  Pitavastatn Calcium (Livalo) 2 mg - [facility]26/21  [facility] Pitavastatn Calcium (Livalo) 2 mg - Last filled 08/28/21 [facility] Losartan (Cozaar) 25 mg - Last filled 07/08/21 [facility] Losartan (Cozaar) 25 mg - [facility]  Does the patient have >5 day gap between last estimated fill dates for any of the above medications or other medication gaps? No   Disease State Questions:  Able to connect with Patient? Yes  Did patient have any problems with their  health recently? Yes Patient reports she is having some pain in her lower teeth and has  a lump in her gums has been to a few Oral Surgeons that she has not been satisfied with wanted to know if she can have a referral for a provider that is preferably a female if not then a make is ok.  Have you had any admissions or emergency room visits or worsening of your condition(s) since last visit? No  Have you had any visits with new specialists or providers since your last visit? Yes patient reports she has sheen Cardiology and they started her on Livalo she is to return for follow up with them soon.  Have you had any new health care problem(s) since your last visit? No Patient reports other than her teeth that are pressing together and hurting her on the bottom none.  Have you run out of any of your medications since you last spoke with clinical pharmacist? Patient reports she will soon be out of her Pantoprazole and was unsure on how to have it refilled called to prescribing office to request refills for her [facility] she reports she has not run out yet but will soon.  Are there any medications you are not taking as prescribed? No  Are you having any issues or side effects with your medications? No  Do you have any other health concerns or questions you want to discuss with your Clinical Pharmacist before your next visit? No  Are there any health concerns that you feel we can do a better job addressing? No Note Patient's response. Are you having any problems with any of the following since the last visit: (select all that apply)  Other  Details: Teeth Pain 12. Any falls since last visit? No  Details: 13. Any increased or uncontrolled pain since last visit? No      Care Gaps: BP- 110/68 08/20/20 AWV- 4/23 CCM- Decl  Star Rating Drugs: Pitavastatn Calcium (Livalo) 2 mg - Last [facility]/21 [facility] Losartan (Cozaar) 25 mg - Last filled 10/03/21 90 DS at  [place] Pharmacist Assistant [phone]

## 2021-10-03 ENCOUNTER — Other Ambulatory Visit: Payer: Self-pay | Admitting: Cardiology

## 2021-10-03 ENCOUNTER — Telehealth: Payer: Self-pay | Admitting: Cardiology

## 2021-10-03 NOTE — Telephone Encounter (Signed)
Patient states she accidentally took her husband's Furosemide 40 MG instead of her Furosemide 20 MG tablet. She states she is not having an symptoms because she just took the pill. However, she is worried because she is home alone and unsure whether symptoms will develop due to doubled dose. Please advise.

## 2021-10-03 NOTE — Telephone Encounter (Signed)
Dicussed w/ pt. Aware she may urinate a little more today than normal. She will intake some extra potassium (via diet) today as well.   She appreciates the call back and agreeable to plan.

## 2021-10-07 ENCOUNTER — Encounter: Payer: Self-pay | Admitting: Cardiology

## 2021-10-07 ENCOUNTER — Ambulatory Visit: Payer: Medicare Other | Admitting: Cardiology

## 2021-10-07 ENCOUNTER — Other Ambulatory Visit: Payer: Medicare Other

## 2021-10-07 VITALS — BP 105/72 | HR 73 | Ht 66.0 in | Wt 188.0 lb

## 2021-10-07 DIAGNOSIS — I5032 Chronic diastolic (congestive) heart failure: Secondary | ICD-10-CM

## 2021-10-07 DIAGNOSIS — I4821 Permanent atrial fibrillation: Secondary | ICD-10-CM | POA: Diagnosis not present

## 2021-10-07 DIAGNOSIS — I10 Essential (primary) hypertension: Secondary | ICD-10-CM | POA: Diagnosis not present

## 2021-10-07 DIAGNOSIS — Z5181 Encounter for therapeutic drug level monitoring: Secondary | ICD-10-CM

## 2021-10-07 DIAGNOSIS — E7800 Pure hypercholesterolemia, unspecified: Secondary | ICD-10-CM | POA: Diagnosis not present

## 2021-10-07 DIAGNOSIS — I824Y9 Acute embolism and thrombosis of unspecified deep veins of unspecified proximal lower extremity: Secondary | ICD-10-CM | POA: Diagnosis not present

## 2021-10-07 DIAGNOSIS — I420 Dilated cardiomyopathy: Secondary | ICD-10-CM | POA: Diagnosis not present

## 2021-10-07 DIAGNOSIS — I350 Nonrheumatic aortic (valve) stenosis: Secondary | ICD-10-CM

## 2021-10-07 DIAGNOSIS — I2510 Atherosclerotic heart disease of native coronary artery without angina pectoris: Secondary | ICD-10-CM

## 2021-10-07 MED ORDER — APIXABAN 5 MG PO TABS: 5.0000 mg | ORAL_TABLET | Freq: Two times a day (BID) | ORAL | 3 refills | Status: DC

## 2021-10-07 NOTE — Progress Notes (Signed)
Cardiology Office Note:    Date:  10/07/2021   ID:  [name], DOB 1945/08/04, MRN [id number]  PCP:  [name], MD  Cardiologist:  Fransico Him, MD    Referring MD: [name], MD   No chief complaint on file.   History of Present Illness:    [name] is a 76 y.o. female with a hx of retroperitoneal fibrosis and non-Hodgkin's lymphoma as well as prior DVT.  She has a history of coronary artery calcifications with a moderate calcium score of 161 (putting her at a high risk percentile for her age) and a possible moderate mid LAD stenosis (though some degradation of images by respiratory artifact). Echo showed mildly reduced  LV systolic function (reported as mildly decreased on myoview),  EF 45-50% with moderate diastolic dysfunction. The patient opted for medical therapy.    She was diagnosed with paroxysmal atrial fibrillation and was started on warfarin and diltiazem CD.  Echo in 8/15 showed EF 50-55%, moderate LV hypertrophy, probably mild mitral stenosis, normal RV size and systolic function.  She was initially on dronedarone to try to maintain NSR, but developed persistent atrial fibrillation. She was changed to amiodarone and underwent cardioversion to NSR in 4/17 but went back into atrial fibrillation.  The decision was made to pursue rate control and her amio was stopped.  She also has a history of chronic diastolic CHF triggered by afib in the past.  She was changed from warfarin to Eliquis.  She is here today for followup and is doing well.  She denies any chest pain or pressure, SOB, DOE (except when she has to rush or is having back pain), PND, orthopnea, LE edema, lightheadedness or syncope. Occasionally she will notice her heart skip a beat but no fast heart beats. She is compliant with her meds and is tolerating meds with no SE.      Past Medical History:  Diagnosis Date   ABDOMINAL PAIN RIGHT UPPER QUADRANT 11/08/2008   ALLERGIC RHINITIS  08/01/2009   Anxiety    Aortic stenosis    mild by echo 08/2020   Cataract    Complication of anesthesia 01/31/2015   ? doesn't remember anything after being taken to pre surgery   Coronary artery calcification seen on CAT scan 05/30/2009   moderate risk coronary calcium score by chest CT   DDD (degenerative disc disease), lumbar    DEPRESSION 06/13/2008   DYSLIPIDEMIA 02/27/2009   DYSPNEA 02/27/2009   Dysrhythmia    Afib   Frequency of urination    GERD (gastroesophageal reflux disease)    "years ago"   Head injury 2016   did not loose consciousness   History of Bell's palsy    History of DVT of lower extremity 2006-- CHRONIC COUMADIN THERAPY   History of hiatal hernia    History of Lyme disease    Hydronephrosis, left chronic -- secondary to retroperitoneal fibrosis   Hypertension    Lyme disease    NON-HODGKIN'S LYMPHOMA, HX OF dx  2005--  chemoradiation completed 2006--  no recurrence   onocologist- dr odogwu--    OSTEOARTHRITIS, MODERATE 04/07/2010   Permanent atrial fibrillation ([place])    On chronic anti-coagulation with warfarin   Pneumonia    x 3 - last time 02/2018   Retroperitoneal fibrosis    Stroke [facility])    ? - [place]- "you have had 3 strokes"    Past Surgical History:  Procedure Laterality Date   BREAST MASS EXCISION  BENIGN-- RIGHT BREAST   CARDIOVASCULAR STRESS TEST  04-04-2009-- PER PT ASYMPTOMATIC-- MEDICAL MANAGEMENT   STUDY WAS EQUIVOCAL/ EF 49%/ GLOBAL HYPOKINESIS/ APPEARS TO BE A MILD ANTERIOR PERFUSION DEFECT COULD REPRESENT ISCHEMIA BUT DUE TO  ACTIVITY WORSE IN STRESS THAN AT REST POSS. THE DEFECT WAS ARTIFACTUAL   CARDIOVERSION N/A 07/11/2015   Procedure: CARDIOVERSION;  Surgeon: Larey Dresser, MD;  Location: [facility];  Service: Cardiovascular;  Laterality: N/A;   CT ANGIOGRAM  FEB 2011   CALCIUM SCORE 161/ POSS. MODERATE MID LAD STENOSIS   CYSTOSCOPY W/ RETROGRADES  04/28/2011   Procedure: CYSTOSCOPY WITH RETROGRADE PYELOGRAM;  Surgeon:  Fredricka Bonine, MD;  Location: [facility];  Service: Urology;  Laterality: Left;   CYSTOSCOPY W/ URETERAL STENT REMOVAL  04/28/2011   Procedure: CYSTOSCOPY WITH STENT REMOVAL;  Surgeon: Fredricka Bonine, MD;  Location: [facility];  Service: Urology;;   EXPLORATORY LAPAROTOMY  2005   LYMPHOMA   EYE SURGERY Bilateral    cataract    GAS INSERTION Right 03/31/2018   Procedure: RIGHT EYE INSERTION OF GAS C3F8;  Surgeon: Bernarda Caffey, MD;  Location: [place];  Service: Ophthalmology;  Laterality: Right;   KNEE ARTHROSCOPY  2005   RIGHT   LASER PHOTO ABLATION Right 03/31/2018   Procedure: RIGHT EYE LASER PHOTO ABLATION;  Surgeon: Bernarda Caffey, MD;  Location: [place];  Service: Ophthalmology;  Laterality: Right;   MULTIPLE CYSTO/ LEFT URETERAL STENT EXCHANGES  LAST ONE 10-28-10   RETROPERITONEAL BX  2007   REVERSE SHOULDER ARTHROPLASTY Right 01/31/2015   Procedure: RIGHT REVERSE SHOULDER ARTHROPLASTY;  Surgeon: Justice Britain, MD;  Location: [place];  Service: Orthopedics;  Laterality: Right;   TONSILLECTOMY  CHILD   TOTAL KNEE ARTHROPLASTY  OCT 2010   RIGHT   TOTAL KNEE ARTHROPLASTY  JAN 2010   LEFT   TRANSTHORACIC ECHOCARDIOGRAM  [id number]   MILD SYSTOLIC DYSFUNCTION/ EF 96-22%/ MODERATE DIASTOLIC DYSFUCTION/ MILD MR/ MILD BIATRIAL ENLARGEMENT   VITRECTOMY 25 GAUGE WITH SCLERAL BUCKLE Right 03/31/2018   Procedure: RIGHT VITRECTOMY 25 GAUGE WITH SCLERAL BUCKLE;  Surgeon: Bernarda Caffey, MD;  Location: [place];  Service: Ophthalmology;  Laterality: Right;    Current Medications: Current Meds  Medication Sig   ALPRAZolam (XANAX) 1 MG tablet Take 1 tablet (1 mg total) by mouth 3 (three) times daily as needed.   apixaban (ELIQUIS) 5 MG TABS tablet Take 1 tablet (5 mg total) by mouth 2 (two) times daily. OVERDUE for follow-up and labwork, MUST see MD for FUTURE refills.   b complex vitamins capsule Take 2 capsules by mouth daily.    benzonatate (TESSALON PERLES)  100 MG capsule Take 1 capsule (100 mg total) by mouth 3 (three) times daily as needed.   buPROPion (WELLBUTRIN XL) 300 MG 24 hr tablet Take 1 tablet (300 mg total) by mouth daily.   chlorhexidine (PERIDEX) 0.12 % solution SMARTSIG:By Mouth   Cholecalciferol (VITAMIN D) 2000 UNITS CAPS Take 2,000 Int'l Units by mouth.   Coenzyme Q10-Fish Oil-Vit E (CO-Q 10 OMEGA-3 FISH OIL PO) Take 100 mg by mouth daily.    diltiazem (CARDIZEM CD) 120 MG 24 hr capsule Take 1 capsule (120 mg total) by mouth daily.   DULoxetine (CYMBALTA) 60 MG capsule Take 60 mg by mouth daily.   FLUAD QUADRIVALENT 0.5 ML injection    furosemide (LASIX) 20 MG tablet Take 1 tablet (20 mg total) by mouth daily.   gatifloxacin (ZYMAXID) 0.5 % SOLN Place 1 drop  into the right eye in the morning, at noon, in the evening, and at bedtime.   losartan (COZAAR) 25 MG tablet TAKE 1 TABLET BY MOUTH EVERY DAY   Lysine 500 MG TABS Take 500 mg by mouth daily.   metoprolol succinate (TOPROL-XL) 25 MG 24 hr tablet TAKE 1 TABLET (25 MG TOTAL) BY MOUTH DAILY.   Multiple Vitamins-Minerals (PRESERVISION AREDS 2 PO) Take 1 capsule by mouth in the morning and at bedtime.   pantoprazole (PROTONIX) 40 MG tablet Take 1 tablet (40 mg total) by mouth daily.   Pitavastatin Calcium (LIVALO) 2 MG TABS Take 1 tablet (2 mg total) by mouth daily. Please keep upcoming appointment in July 2023 for future refills. Thank you   SUTAB [phone] MG TABS Take 1 kit by mouth once.   valACYclovir (VALTREX) 1000 MG tablet TAKE 2 TABLETS AT ONSET OF COLD SORES AND REPEAT 2 IN 12 HOURS.     Allergies:   Chlorhexidine base, Clindamycin/lincomycin, Sulfa antibiotics, and Penicillins   Social History   Socioeconomic History   Marital status: Married    Spouse name: Not on file   Number of children: Not on file   Years of education: Not on file   Highest education level: Not on file  Occupational History   Not on file  Tobacco Use   Smoking status: Former     Packs/day: 2.00    Years: 52.00    Total pack years: 104.00    Types: Cigarettes    Quit date: 03/24/2008    Years since quitting: 13.5   Smokeless tobacco: Never  Vaping Use   Vaping Use: Never used  Substance and Sexual Activity   Alcohol use: Not Currently    Comment: RARE   Drug use: No   Sexual activity: Not Currently  Other Topics Concern   Not on file  Social History Narrative   Not on file   Social Determinants of Health   Financial Resource Strain: Low Risk  (07/09/2021)   Overall Financial Resource Strain (CARDIA)    Difficulty of Paying Living Expenses: Not hard at all  Food Insecurity: No Food Insecurity (07/09/2021)   Hunger Vital Sign    Worried About Running Out of Food in the Last Year: Never true    Ran Out of Food in the Last Year: Never true  Transportation Needs: No Transportation Needs (07/09/2021)   PRAPARE - Hydrologist (Medical): No    Lack of Transportation (Non-Medical): No  Physical Activity: Insufficiently Active (07/09/2021)   Exercise Vital Sign    Days of Exercise per Week: 5 days    Minutes of Exercise per Session: 20 min  Stress: Stress Concern Present (07/09/2021)   [place]    Feeling of Stress : To some extent  Social Connections: Socially Isolated (07/09/2021)   Social Connection and Isolation Panel [NHANES]    Frequency of Communication with Friends and Family: More than three times a week    Frequency of Social Gatherings with Friends and Family: More than three times a week    Attends Religious Services: Never    Marine scientist or Organizations: No    Attends Archivist Meetings: Never    Marital Status: Widowed     Family History: The patient's family history includes Arthritis in her mother; Diabetes in her sister; Heart attack in her mother and sister; Heart disease in her mother; Hypertension in her  sister; Liver  disease in her son; Stroke in her sister. There is no history of Colon cancer, Stomach cancer, Pancreatic cancer, Esophageal cancer, or Rectal cancer.  ROS:   Please see the history of present illness.    Review of Systems  Musculoskeletal:  Negative for muscle cramps.    All other systems reviewed and negative.   EKGs/Labs/Other Studies Reviewed:    The following studies were reviewed today: none  EKG:  EKG is  ordered today.  The ekg ordered today demonstrates atrial fibrillation with nonspecific IVCD and ST abnormality Recent Labs: No results found for requested labs within last 365 days.   Recent Lipid Panel    Component Value Date/Time   CHOL 136 09/06/2020 1051   TRIG 76 09/06/2020 1051   HDL 61 09/06/2020 1051   CHOLHDL 2.2 09/06/2020 1051   CHOLHDL 2.2 09/13/2015 1013   VLDL 17 09/13/2015 1013   LDLCALC 60 09/06/2020 1051    Physical Exam:    VS:  Ht _0  (1.676 m)   BMI 29.05 kg/m     Wt Readings from Last 3 Encounters:  07/09/21 180 lb (81.6 kg)  08/20/20 189 lb 3.2 oz (85.8 kg)  08/13/20 188 lb (85.3 kg)    GEN: Well nourished, well developed in no acute distress HEENT: Normal NECK: No JVD; No carotid bruits LYMPHATICS: No lymphadenopathy CARDIAC:irregularly irregular, no murmurs, rubs, gallops RESPIRATORY:  Clear to auscultation without rales, wheezing or rhonchi  ABDOMEN: Soft, non-tender, non-distended MUSCULOSKELETAL:  No edema; No deformity  SKIN: Warm and dry NEUROLOGIC:  Alert and oriented x 3 PSYCHIATRIC:  Normal affect   ASSESSMENT:    1. Coronary artery calcification seen on CAT scan   2. Dilated cardiomyopathy ([place])   3. Permanent atrial fibrillation ([place])   4. Nonrheumatic aortic valve stenosis   5. Essential hypertension   6. Chronic diastolic CHF (congestive heart failure) ([place])   7. Pure hypercholesterolemia    PLAN:    In order of problems listed above:  1.  Coronary artery calcification seen on CAT scan -She has not had  any anginal symptoms since I saw her last -Continue prescription drug therapy with statin and Toprol-XL 25 mg daily  -No aspirin due to DOAC   2.  Dilated cardiomyopathy  -EF was 50-55% on echo 2018 and 45 to 50% by echo 2022   3.  Permanent atrial fibrillation  -Heart rate is controlled in chronic A-fib -She has not had any bleeding problems on DOAC -Continue prescription drug therapy with Toprol-XL 25 mg daily, Cardizem CD 120 mg daily and Eliquis 5 mg twice daily with as needed refills -Check BMET and CBC   4.  Mild aortic stenosis  -mild by echo 08/2020 with mean AVG 58mHg -repeat 2D echo   5.  Hypertension  -BP is controlled on exam today and actually soft -Continue prescription drug management with Toprol-XL 25 mg daily and Cardizem CD 1287mdaily with as needed refills -stop Losartan due to soft BP  6.  Chronic diastolic CHF - -She appears euvolemic on exam today -she has chronic DOE which is stable on diuretics -Continue prescription drug management Lasix 20 mg daily with as needed refills  7.  Hyperlipidemia  -LDL goal is less than 70.   -Check FLP and ALT -Continue prescription drug management with Livalo 2 mg daily with as needed refills  Followup 1 year  Medication Adjustments/Labs and Tests Ordered: Current medicines are reviewed at length with the patient today.  Concerns regarding medicines are outlined above.  No orders of the defined types were placed in this encounter.  No orders of the defined types were placed in this encounter.   Signed, Fransico Him, MD  10/07/2021 3:18 PM    [place]

## 2021-10-07 NOTE — Patient Instructions (Signed)
Medication Instructions:  Your physician has recommended you make the following change in your medication: 1) STOP taking losartan  *If you need a refill on your cardiac medications before your next appointment, please call your pharmacy*  Lab Work: Come back fasting for a lipid panel  If you have labs (blood work) drawn today and your tests are completely normal, you will receive your results only by: [place] (if you have MyChart) OR A paper copy in the mail If you have any lab test that is abnormal or we need to change your treatment, we will call you to review the results.  Testing/Procedures: Your physician has requested that you have an echocardiogram. Echocardiography is a painless test that uses sound waves to create images of your heart. It provides your doctor with information about the size and shape of your heart and how well your heart's chambers and valves are working. This procedure takes approximately one hour. There are no restrictions for this procedure.  Follow-Up: At [facility], you and your health needs are our priority.  As part of our continuing mission to provide you with exceptional heart care, we have created designated Provider Care Teams.  These Care Teams include your primary Cardiologist (physician) and Advanced Practice Providers (APPs -  Physician Assistants and Nurse Practitioners) who all work together to provide you with the care you need, when you need it.   Your next appointment:   1 year(s)  The format for your next appointment:   In Person  Provider:   Fransico Him, MD    Important Information About Sugar

## 2021-10-08 ENCOUNTER — Telehealth: Payer: Self-pay

## 2021-10-08 DIAGNOSIS — I2510 Atherosclerotic heart disease of native coronary artery without angina pectoris: Secondary | ICD-10-CM

## 2021-10-08 DIAGNOSIS — I5032 Chronic diastolic (congestive) heart failure: Secondary | ICD-10-CM

## 2021-10-08 DIAGNOSIS — I10 Essential (primary) hypertension: Secondary | ICD-10-CM

## 2021-10-08 LAB — COMPREHENSIVE METABOLIC PANEL
ALT: 23 IU/L (ref 0–32)
AST: 26 IU/L (ref 0–40)
Albumin/Globulin Ratio: 2 (ref 1.2–2.2)
Albumin: 4.3 g/dL (ref 3.8–4.8)
Alkaline Phosphatase: 70 IU/L (ref 44–121)
BUN/Creatinine Ratio: 16 (ref 12–28)
BUN: 19 mg/dL (ref 8–27)
Bilirubin Total: 0.3 mg/dL (ref 0.0–1.2)
CO2: 22 mmol/L (ref 20–29)
Calcium: 8.9 mg/dL (ref 8.7–10.3)
Chloride: 103 mmol/L (ref 96–106)
Creatinine, Ser: 1.21 mg/dL — ABNORMAL HIGH (ref 0.57–1.00)
Globulin, Total: 2.1 g/dL (ref 1.5–4.5)
Glucose: 107 mg/dL — ABNORMAL HIGH (ref 70–99)
Potassium: 3.6 mmol/L (ref 3.5–5.2)
Sodium: 143 mmol/L (ref 134–144)
Total Protein: 6.4 g/dL (ref 6.0–8.5)
eGFR: 46 mL/min/{1.73_m2} — ABNORMAL LOW (ref >59)

## 2021-10-08 LAB — CBC
Hematocrit: 35.7 % (ref 34.0–46.6)
Hemoglobin: 11.7 g/dL (ref 11.1–15.9)
MCH: 30.5 pg (ref 26.6–33.0)
MCHC: 32.8 g/dL (ref 31.5–35.7)
MCV: 93 fL (ref 79–97)
Platelets: 171 10*3/uL (ref 150–450)
RBC: 3.83 x10E6/uL (ref 3.77–5.28)
RDW: 12.6 % (ref 11.7–15.4)
WBC: 6.4 10*3/uL (ref 3.4–10.8)

## 2021-10-08 MED ORDER — POTASSIUM CHLORIDE CRYS ER 20 MEQ PO TBCR: 20.0000 meq | EXTENDED_RELEASE_TABLET | Freq: Every day | ORAL | 3 refills | Status: DC

## 2021-10-08 NOTE — Telephone Encounter (Signed)
-----   Message from Sueanne Margarita, MD sent at 10/08/2021 10:05 AM EDT ----- SCr stable and K+ borderline. Start Kdur 33mq daily and repeat BMET in 1 week

## 2021-10-08 NOTE — Telephone Encounter (Signed)
The patient has been notified of the result and verbalized understanding.  All questions (if any) were answered. Antonieta Iba, RN 10/08/2021 11:45 AM  Potassium has been sent. Labs have been scheduled.

## 2021-10-15 DIAGNOSIS — N261 Atrophy of kidney (terminal): Secondary | ICD-10-CM | POA: Diagnosis not present

## 2021-10-15 DIAGNOSIS — N281 Cyst of kidney, acquired: Secondary | ICD-10-CM | POA: Diagnosis not present

## 2021-10-15 MED ORDER — PANTOPRAZOLE SODIUM 40 MG PO TBEC: 40.0000 mg | DELAYED_RELEASE_TABLET | Freq: Every day | ORAL | 3 refills | Status: DC

## 2021-10-15 NOTE — Telephone Encounter (Signed)
I sent the Protonix refill in.

## 2021-10-15 NOTE — Addendum Note (Signed)
Addended by: [name] on: 10/15/2021 01:07 PM   Modules accepted: Orders

## 2021-10-16 ENCOUNTER — Telehealth: Payer: Self-pay | Admitting: Cardiology

## 2021-10-16 MED ORDER — POTASSIUM CHLORIDE 20 MEQ PO PACK: 20.0000 meq | PACK | Freq: Every day | ORAL | 7 refills | Status: DC

## 2021-10-16 NOTE — Telephone Encounter (Signed)
Pt c/o medication issue:  1. Name of Medication:   2. How are you currently taking this medication (dosage and times per day)? potassium chloride SA (KLOR-CON M20) 20 MEQ tablet  3. Are you having a reaction (difficulty breathing--STAT)? no  4. What is your medication issue? Patient doesn't like it, it's hard for her to shallow. She wants to know if she can have it in liquid form.  If so [id number]

## 2021-10-16 NOTE — Telephone Encounter (Signed)
Spoke with the patient and have sent her in prescription for potassium packets 20 meq.

## 2021-10-20 ENCOUNTER — Ambulatory Visit (INDEPENDENT_AMBULATORY_CARE_PROVIDER_SITE_OTHER): Payer: Medicare Other | Admitting: Family Medicine

## 2021-10-20 ENCOUNTER — Encounter: Payer: Self-pay | Admitting: Family Medicine

## 2021-10-20 ENCOUNTER — Other Ambulatory Visit: Payer: Self-pay | Admitting: Cardiology

## 2021-10-20 VITALS — BP 126/88 | HR 92 | Temp 97.9°F | Ht 66.0 in | Wt 188.3 lb

## 2021-10-20 DIAGNOSIS — K0530 Chronic periodontitis, unspecified: Secondary | ICD-10-CM | POA: Diagnosis not present

## 2021-10-20 DIAGNOSIS — L659 Nonscarring hair loss, unspecified: Secondary | ICD-10-CM

## 2021-10-20 DIAGNOSIS — E785 Hyperlipidemia, unspecified: Secondary | ICD-10-CM

## 2021-10-20 NOTE — Progress Notes (Signed)
Established Patient Office Visit  Subjective   Patient ID: [name], female    DOB: May 01, 1945  Age: 76 y.o. MRN: [id number]  Chief Complaint  Patient presents with   Referral    HPI   Patient has multiple chronic problems including history of atrial fibrillation, hypertension, CAD, chronic diastolic heart failure, osteoarthritis, past history of non-Hodgkin lymphoma, hyperlipidemia.  She states her husband died in 05-29-22.  He had multiple chronic medical problems.  She is coping fairly well.  She is here today to discuss multiple issues as follows  Very poor dentition.  She particularly has severe gum decay right lower gum.  She has 1 tooth that she knows needs to be pulled.  She is requesting referral to oral surgeon to discuss options.  No recent fever.  No history of diabetes.  She does have a history of chronic atrial fibrillation is on Eliquis for that.  She also relates some recent diffuse alopecia.  She is interested in getting thyroid checked.  Has been normal with last check.  She has hyperlipidemia and takes Livalo.  Has not had lipids checked in some time.  She would like to get that added to labs today as well.  Past Medical History:  Diagnosis Date   ABDOMINAL PAIN RIGHT UPPER QUADRANT 11/08/2008   ALLERGIC RHINITIS 08/01/2009   Anxiety    Aortic stenosis    mild by echo 08/2020   Cataract    Complication of anesthesia 01/31/2015   ? doesn't remember anything after being taken to pre surgery   Coronary artery calcification seen on CAT scan 05/30/2009   moderate risk coronary calcium score by chest CT   DDD (degenerative disc disease), lumbar    DEPRESSION 06/13/2008   DYSLIPIDEMIA 02/27/2009   DYSPNEA 02/27/2009   Dysrhythmia    Afib   Frequency of urination    GERD (gastroesophageal reflux disease)    "years ago"   Head injury 2016   did not loose consciousness   History of Bell's palsy    History of DVT of lower extremity 2006-- CHRONIC  COUMADIN THERAPY   History of hiatal hernia    History of Lyme disease    Hydronephrosis, left chronic -- secondary to retroperitoneal fibrosis   Hypertension    Lyme disease    NON-HODGKIN'S LYMPHOMA, HX OF dx  2005--  chemoradiation completed 2006--  no recurrence   onocologist- dr odogwu--    OSTEOARTHRITIS, MODERATE 04/07/2010   Permanent atrial fibrillation ([place])    On chronic anti-coagulation with warfarin   Pneumonia    x 3 - last time 02/2018   Retroperitoneal fibrosis    Stroke ([employer])    ? - [place]- "you have had 3 strokes"   Past Surgical History:  Procedure Laterality Date   BREAST MASS EXCISION     BENIGN-- RIGHT BREAST   CARDIOVASCULAR STRESS TEST  04-04-2009-- PER PT ASYMPTOMATIC-- MEDICAL MANAGEMENT   STUDY WAS EQUIVOCAL/ EF 49%/ GLOBAL HYPOKINESIS/ APPEARS TO BE A MILD ANTERIOR PERFUSION DEFECT COULD REPRESENT ISCHEMIA BUT DUE TO  ACTIVITY WORSE IN STRESS THAN AT REST POSS. THE DEFECT WAS ARTIFACTUAL   CARDIOVERSION N/A 07/11/2015   Procedure: CARDIOVERSION;  Surgeon: Larey Dresser, MD;  Location: [place];  Service: Cardiovascular;  Laterality: N/A;   CT ANGIOGRAM  FEB 2011   CALCIUM SCORE 161/ POSS. MODERATE MID LAD STENOSIS   CYSTOSCOPY W/ RETROGRADES  04/28/2011   Procedure: CYSTOSCOPY WITH RETROGRADE PYELOGRAM;  Surgeon: Fredricka Bonine, MD;  Location: [place];  Service: Urology;  Laterality: Left;   CYSTOSCOPY W/ URETERAL STENT REMOVAL  04/28/2011   Procedure: CYSTOSCOPY WITH STENT REMOVAL;  Surgeon: Fredricka Bonine, MD;  Location: [facility];  Service: Urology;;   EXPLORATORY LAPAROTOMY  2005   LYMPHOMA   EYE SURGERY Bilateral    cataract    GAS INSERTION Right 03/31/2018   Procedure: RIGHT EYE INSERTION OF GAS C3F8;  Surgeon: Bernarda Caffey, MD;  Location: [place];  Service: Ophthalmology;  Laterality: Right;   KNEE ARTHROSCOPY  2005   RIGHT   LASER PHOTO ABLATION Right 03/31/2018   Procedure: RIGHT EYE LASER  PHOTO ABLATION;  Surgeon: Bernarda Caffey, MD;  Location: [place];  Service: Ophthalmology;  Laterality: Right;   MULTIPLE CYSTO/ LEFT URETERAL STENT EXCHANGES  LAST ONE 10-28-10   RETROPERITONEAL BX  2007   REVERSE SHOULDER ARTHROPLASTY Right 01/31/2015   Procedure: RIGHT REVERSE SHOULDER ARTHROPLASTY;  Surgeon: Justice Britain, MD;  Location: [place];  Service: Orthopedics;  Laterality: Right;   TONSILLECTOMY  CHILD   TOTAL KNEE ARTHROPLASTY  OCT 2010   RIGHT   TOTAL KNEE ARTHROPLASTY  JAN 2010   LEFT   TRANSTHORACIC ECHOCARDIOGRAM  [id number]   MILD SYSTOLIC DYSFUNCTION/ EF 48-88%/ MODERATE DIASTOLIC DYSFUCTION/ MILD MR/ MILD BIATRIAL ENLARGEMENT   VITRECTOMY 25 GAUGE WITH SCLERAL BUCKLE Right 03/31/2018   Procedure: RIGHT VITRECTOMY 25 GAUGE WITH SCLERAL BUCKLE;  Surgeon: Bernarda Caffey, MD;  Location: [place];  Service: Ophthalmology;  Laterality: Right;    reports that she quit smoking about 13 years ago. Her smoking use included cigarettes. She has a 104.00 pack-year smoking history. She has never used smokeless tobacco. She reports that she does not currently use alcohol. She reports that she does not use drugs. family history includes Arthritis in her mother; Diabetes in her sister; Heart attack in her mother and sister; Heart disease in her mother; Hypertension in her sister; Liver disease in her son; Stroke in her sister. Allergies  Allergen Reactions   Chlorhexidine Base Itching    CHG WIPES   Clindamycin/Lincomycin Other (See Comments)    Caused fire like sensation in her stomach    Sulfa Antibiotics Other (See Comments)    Severe GI upset   Penicillins Hives and Rash    DID THE REACTION INVOLVE: Swelling of the face/tongue/throat, SOB, or low BP? No Sudden or severe rash/hives, skin peeling, or the inside of the mouth or nose? No Did it require medical treatment? No When did it last happen?  90s If all above answers are "NO", may proceed with cephalosporin use.     Review of Systems   Constitutional:  Negative for chills and fever.  Respiratory:  Negative for shortness of breath.   Cardiovascular:  Negative for chest pain.  Genitourinary:  Negative for dysuria.  Neurological:  Negative for headaches.      Objective:     BP 126/88 (BP Location: Left Arm, Patient Position: Sitting, Cuff Size: Normal)   Pulse 92   Temp 97.9 F (36.6 C) (Oral)   Ht '5\' 6"'$  (1.676 m)   Wt 188 lb 4.8 oz (85.4 kg)   SpO2 98%   BMI 30.39 kg/m    Physical Exam Vitals reviewed.  HENT:     Mouth/Throat:     Comments: Severe dental decay diffusely.  She has severe gum recession/decay right lower gum Cardiovascular:     Comments: Irregular rhythm but rate control Pulmonary:  Effort: Pulmonary effort is normal.     Breath sounds: Normal breath sounds.  Neurological:     Mental Status: She is alert.      No results found for any visits on 10/20/21.    The 10-year ASCVD risk score (Arnett DK, et al., 2019) is: 25.1%    Assessment & Plan:   Problem List Items Addressed This Visit       Unprioritized   HLD (hyperlipidemia)   Relevant Orders   Lipid panel   Hepatic function panel   Other Visit Diagnoses     Severe gum disease    -  Primary   Relevant Orders   Ambulatory referral to Oral Maxillofacial Surgery   Alopecia       Relevant Orders   TSH     -Set up referral to oral surgeon for further assessment of her severe gum disease -Check TSH to rule out hypothyroidism -Check lipid panel.  She is on statin and no lipid over a year  No follow-ups on file.    Carolann Littler, MD

## 2021-10-21 ENCOUNTER — Encounter: Payer: Self-pay | Admitting: Family Medicine

## 2021-10-21 DIAGNOSIS — N183 Chronic kidney disease, stage 3 unspecified: Secondary | ICD-10-CM | POA: Insufficient documentation

## 2021-10-21 LAB — LIPID PANEL
Cholesterol: 137 mg/dL (ref 0–200)
HDL: 60.6 mg/dL (ref >39.00)
LDL Cholesterol: 55 mg/dL (ref 0–99)
NonHDL: 76.22
Total CHOL/HDL Ratio: 2
Triglycerides: 106 mg/dL (ref 0.0–149.0)
VLDL: 21.2 mg/dL (ref 0.0–40.0)

## 2021-10-21 LAB — HEPATIC FUNCTION PANEL
ALT: 19 U/L (ref 0–35)
AST: 19 U/L (ref 0–37)
Albumin: 4.3 g/dL (ref 3.5–5.2)
Alkaline Phosphatase: 64 U/L (ref 39–117)
Bilirubin, Direct: 0.1 mg/dL (ref 0.0–0.3)
Total Bilirubin: 0.4 mg/dL (ref 0.2–1.2)
Total Protein: 7.5 g/dL (ref 6.0–8.3)

## 2021-10-21 LAB — TSH: TSH: 1.75 u[IU]/mL (ref 0.35–5.50)

## 2021-10-21 MED ORDER — CHLORHEXIDINE GLUCONATE 0.12 % MT SOLN: OROMUCOSAL | 0 refills | Status: DC

## 2021-10-21 NOTE — Addendum Note (Signed)
Addended by: Nilda Riggs on: 10/21/2021 01:56 PM   Modules accepted: Orders

## 2021-10-22 ENCOUNTER — Other Ambulatory Visit: Payer: Self-pay | Admitting: Family Medicine

## 2021-10-22 ENCOUNTER — Other Ambulatory Visit: Payer: Self-pay | Admitting: Cardiology

## 2021-10-22 DIAGNOSIS — F324 Major depressive disorder, single episode, in partial remission: Secondary | ICD-10-CM

## 2021-10-23 ENCOUNTER — Other Ambulatory Visit: Payer: Medicare Other

## 2021-10-23 ENCOUNTER — Ambulatory Visit (HOSPITAL_COMMUNITY): Payer: Medicare Other | Attending: Cardiology

## 2021-10-23 DIAGNOSIS — I10 Essential (primary) hypertension: Secondary | ICD-10-CM | POA: Insufficient documentation

## 2021-10-23 DIAGNOSIS — E7800 Pure hypercholesterolemia, unspecified: Secondary | ICD-10-CM | POA: Insufficient documentation

## 2021-10-23 DIAGNOSIS — I2510 Atherosclerotic heart disease of native coronary artery without angina pectoris: Secondary | ICD-10-CM | POA: Diagnosis not present

## 2021-10-23 DIAGNOSIS — I350 Nonrheumatic aortic (valve) stenosis: Secondary | ICD-10-CM

## 2021-10-23 DIAGNOSIS — I5032 Chronic diastolic (congestive) heart failure: Secondary | ICD-10-CM | POA: Diagnosis not present

## 2021-10-23 DIAGNOSIS — I4821 Permanent atrial fibrillation: Secondary | ICD-10-CM | POA: Insufficient documentation

## 2021-10-23 DIAGNOSIS — I420 Dilated cardiomyopathy: Secondary | ICD-10-CM

## 2021-10-23 LAB — BASIC METABOLIC PANEL
BUN/Creatinine Ratio: 20 (ref 12–28)
BUN: 24 mg/dL (ref 8–27)
CO2: 25 mmol/L (ref 20–29)
Calcium: 9.6 mg/dL (ref 8.7–10.3)
Chloride: 105 mmol/L (ref 96–106)
Creatinine, Ser: 1.19 mg/dL — ABNORMAL HIGH (ref 0.57–1.00)
Glucose: 122 mg/dL — ABNORMAL HIGH (ref 70–99)
Potassium: 4.3 mmol/L (ref 3.5–5.2)
Sodium: 143 mmol/L (ref 134–144)
eGFR: 47 mL/min/{1.73_m2} — ABNORMAL LOW (ref >59)

## 2021-10-23 LAB — LIPID PANEL
Chol/HDL Ratio: 2.4 ratio (ref 0.0–4.4)
Cholesterol, Total: 136 mg/dL (ref 100–199)
HDL: 56 mg/dL (ref >39)
LDL Chol Calc (NIH): 62 mg/dL (ref 0–99)
Triglycerides: 95 mg/dL (ref 0–149)
VLDL Cholesterol Cal: 18 mg/dL (ref 5–40)

## 2021-10-23 LAB — ECHOCARDIOGRAM COMPLETE
AR max vel: 1 cm2
AV Area VTI: 0.96 cm2
AV Area mean vel: 0.92 cm2
AV Mean grad: 7 mmHg
AV Peak grad: 12.3 mmHg
Ao pk vel: 1.76 m/s
S' Lateral: 3.4 cm

## 2021-10-24 ENCOUNTER — Encounter: Payer: Self-pay | Admitting: Cardiology

## 2021-10-24 ENCOUNTER — Telehealth: Payer: Self-pay

## 2021-10-24 DIAGNOSIS — I5032 Chronic diastolic (congestive) heart failure: Secondary | ICD-10-CM

## 2021-10-24 DIAGNOSIS — I350 Nonrheumatic aortic (valve) stenosis: Secondary | ICD-10-CM

## 2021-10-24 DIAGNOSIS — I420 Dilated cardiomyopathy: Secondary | ICD-10-CM

## 2021-10-24 NOTE — Telephone Encounter (Signed)
-----   Message from Sueanne Margarita, MD sent at 10/24/2021  2:33 PM EDT ----- Echo showed mildly reduced LVF with EF 45-50%.  Moderately enlarged RV, biatrial enlargement, and mildly leaky MV. There is mild AS.  No change from 1 year ago. Repeat echo in 1 year

## 2021-10-24 NOTE — Telephone Encounter (Signed)
The patient has been notified of the result and verbalized understanding.  All questions (if any) were answered. Antonieta Iba, RN 10/24/2021 2:58 PM

## 2021-10-30 ENCOUNTER — Telehealth: Payer: Self-pay | Admitting: Family Medicine

## 2021-10-30 NOTE — Telephone Encounter (Signed)
Pt called to see if she needs to have another vaccine for Covid. Pt states she has already had 4.  Also, she says she has already had 2 shots for shingles.  Please advise. [phone]

## 2021-10-30 NOTE — Telephone Encounter (Signed)
Last Covid Booster-04/07/2021

## 2021-10-31 NOTE — Telephone Encounter (Signed)
Spoke with patient about message, voiced understanding.  

## 2021-11-06 DIAGNOSIS — Z961 Presence of intraocular lens: Secondary | ICD-10-CM | POA: Diagnosis not present

## 2021-11-06 DIAGNOSIS — H5213 Myopia, bilateral: Secondary | ICD-10-CM | POA: Diagnosis not present

## 2021-11-06 DIAGNOSIS — H353132 Nonexudative age-related macular degeneration, bilateral, intermediate dry stage: Secondary | ICD-10-CM | POA: Diagnosis not present

## 2021-11-12 ENCOUNTER — Ambulatory Visit: Payer: Medicare Other | Admitting: Podiatry

## 2021-11-12 ENCOUNTER — Encounter: Payer: Self-pay | Admitting: Podiatry

## 2021-11-12 DIAGNOSIS — L84 Corns and callosities: Secondary | ICD-10-CM | POA: Diagnosis not present

## 2021-11-12 DIAGNOSIS — D689 Coagulation defect, unspecified: Secondary | ICD-10-CM

## 2021-11-12 DIAGNOSIS — B351 Tinea unguium: Secondary | ICD-10-CM | POA: Diagnosis not present

## 2021-11-12 DIAGNOSIS — M79676 Pain in unspecified toe(s): Secondary | ICD-10-CM

## 2021-11-17 ENCOUNTER — Other Ambulatory Visit: Payer: Self-pay | Admitting: Family Medicine

## 2021-11-18 ENCOUNTER — Other Ambulatory Visit: Payer: Self-pay | Admitting: Oral Surgery

## 2021-11-18 DIAGNOSIS — K1370 Unspecified lesions of oral mucosa: Secondary | ICD-10-CM | POA: Diagnosis not present

## 2021-11-18 DIAGNOSIS — K1379 Other lesions of oral mucosa: Secondary | ICD-10-CM | POA: Diagnosis not present

## 2021-11-18 DIAGNOSIS — K1230 Oral mucositis (ulcerative), unspecified: Secondary | ICD-10-CM | POA: Diagnosis not present

## 2021-11-19 NOTE — Progress Notes (Addendum)
Subjective:  Patient ID: [name], female    DOB: [birth year],  MRN: [id number]  76 y.o. female presents with at risk foot care with h/o clotting disorder and corn(s) bilateral 2nd toes and painful thick toenails that are difficult to trim. Painful toenails interfere with ambulation. Aggravating factors include wearing enclosed shoe gear. Pain is relieved with periodic professional debridement. Painful corns are aggravated when weightbearing when wearing enclosed shoe gear. Pain is relieved with periodic professional debridement..    New problem(s): None    PCP: [name], MD and last visit was: October 20, 2021.  Review of Systems: Negative except as noted in the HPI.   Allergies  Allergen Reactions   Chlorhexidine     Other reaction(s): Not available   Chlorhexidine Base Itching    CHG WIPES   Clindamycin/Lincomycin Other (See Comments)    Caused fire like sensation in her stomach    Sulfa Antibiotics Other (See Comments)    Severe GI upset   Penicillins Hives and Rash    DID THE REACTION INVOLVE: Swelling of the face/tongue/throat, SOB, or low BP? No Sudden or severe rash/hives, skin peeling, or the inside of the mouth or nose? No Did it require medical treatment? No When did it last happen?  90s If all above answers are "NO", may proceed with cephalosporin use.     Objective:  There were no vitals filed for this visit. Constitutional Patient is a pleasant 76 y.o. Caucasian femalemale in NAD. AAO x 3.  Vascular Capillary fill time to digits immediate b/l.  DP/PT pulse(s) are palpable b/l lower extremities. Pedal hair sparse. Lower extremity skin temperature gradient within normal limits. No pain with calf compression b/l. No edema noted b/l lower extremities. No cyanosis or clubbing noted.   Neurologic Protective sensation intact 5/5 intact bilaterally with 10g monofilament b/l. Vibratory sensation intact b/l. No clonus b/l.   Dermatologic Pedal skin is warm and  supple b/l.  No open wounds b/l lower extremities. No interdigital macerations b/l lower extremities. Toenails 1-5 b/l elongated, discolored, dystrophic, thickened, crumbly with subungual debris and tenderness to dorsal palpation. Incurvated nailplate lateral border right hallux.  Nail border hypertrophy minimal. There is tenderness to palpation. Sign(s) of infection: no clinical signs of infection noted on examination today.. Hyperkeratotic lesion(s) distal tip of left 2nd toe, distal tip of right 2nd toe, distal tip of left 3rd toe, and distal tip of right 3rd toe.  No erythema, no edema, no drainage, no fluctuance.  Orthopedic: Normal muscle strength 5/5 to all lower extremity muscle groups bilaterally. Patient ambulates independent of any assistive aids. 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   Assessment:   1. Pain due to onychomycosis of toenail   2. Pre-ulcerative corn or callous   3. Clotting disorder [facility])    Plan:  Patient was evaluated and treated and all questions answered. Consent given for treatment as described below: -Examined patient. -Toenails 1-5 b/l were debrided in length and girth with sterile nail nippers and dremel without iatrogenic bleeding.  -Offending nail border debrided and curretaged R hallux utilizing sterile nail nipper and currette. Border(s) cleansed with alcohol and TAO applied. Patient/POA/Caregiver/Facility instructed to apply Neosporin Cream  to right great toe once daily for 7 days. Call office if there are any concerns. -Corn(s) bilateral 2nd toes and bilateral 3rd toes pared utilizing sterile scalpel blade without complication or incident. Total number debrided=4. -Patient/POA to call should there be question/concern in the interim.  Return in  about 3 months (around 02/12/2022).  Marzetta Board, DPM

## 2021-12-01 DIAGNOSIS — D225 Melanocytic nevi of trunk: Secondary | ICD-10-CM | POA: Diagnosis not present

## 2021-12-01 DIAGNOSIS — L820 Inflamed seborrheic keratosis: Secondary | ICD-10-CM | POA: Diagnosis not present

## 2021-12-01 DIAGNOSIS — B078 Other viral warts: Secondary | ICD-10-CM | POA: Diagnosis not present

## 2021-12-01 DIAGNOSIS — D2262 Melanocytic nevi of left upper limb, including shoulder: Secondary | ICD-10-CM | POA: Diagnosis not present

## 2021-12-05 ENCOUNTER — Ambulatory Visit: Payer: Medicare Other | Admitting: Podiatry

## 2021-12-19 ENCOUNTER — Telehealth: Payer: Self-pay | Admitting: Family Medicine

## 2021-12-19 NOTE — Telephone Encounter (Signed)
Pt is calling and would like to know the order she should get each vaccines and how far apart . Pt would like to get covid booster, rsv and flu shot. Please advise

## 2021-12-19 NOTE — Telephone Encounter (Signed)
Patient informed of the message and verbalized understanding 

## 2021-12-22 ENCOUNTER — Other Ambulatory Visit: Payer: Self-pay | Admitting: Cardiology

## 2021-12-22 DIAGNOSIS — I4819 Other persistent atrial fibrillation: Secondary | ICD-10-CM

## 2021-12-23 DIAGNOSIS — R262 Difficulty in walking, not elsewhere classified: Secondary | ICD-10-CM | POA: Diagnosis not present

## 2021-12-23 DIAGNOSIS — Z7901 Long term (current) use of anticoagulants: Secondary | ICD-10-CM | POA: Diagnosis not present

## 2021-12-23 DIAGNOSIS — N289 Disorder of kidney and ureter, unspecified: Secondary | ICD-10-CM | POA: Diagnosis not present

## 2021-12-23 DIAGNOSIS — K828 Other specified diseases of gallbladder: Secondary | ICD-10-CM | POA: Diagnosis not present

## 2021-12-23 DIAGNOSIS — I110 Hypertensive heart disease with heart failure: Secondary | ICD-10-CM | POA: Diagnosis not present

## 2021-12-23 DIAGNOSIS — K219 Gastro-esophageal reflux disease without esophagitis: Secondary | ICD-10-CM | POA: Diagnosis not present

## 2021-12-23 DIAGNOSIS — Z96653 Presence of artificial knee joint, bilateral: Secondary | ICD-10-CM | POA: Diagnosis not present

## 2021-12-23 DIAGNOSIS — R5381 Other malaise: Secondary | ICD-10-CM | POA: Diagnosis not present

## 2021-12-23 DIAGNOSIS — N179 Acute kidney failure, unspecified: Secondary | ICD-10-CM | POA: Diagnosis not present

## 2021-12-23 DIAGNOSIS — Z881 Allergy status to other antibiotic agents status: Secondary | ICD-10-CM | POA: Diagnosis not present

## 2021-12-23 DIAGNOSIS — S299XXA Unspecified injury of thorax, initial encounter: Secondary | ICD-10-CM | POA: Diagnosis not present

## 2021-12-23 DIAGNOSIS — R945 Abnormal results of liver function studies: Secondary | ICD-10-CM | POA: Diagnosis not present

## 2021-12-23 DIAGNOSIS — Z743 Need for continuous supervision: Secondary | ICD-10-CM | POA: Diagnosis not present

## 2021-12-23 DIAGNOSIS — Z733 Stress, not elsewhere classified: Secondary | ICD-10-CM | POA: Diagnosis not present

## 2021-12-23 DIAGNOSIS — Z86718 Personal history of other venous thrombosis and embolism: Secondary | ICD-10-CM | POA: Diagnosis not present

## 2021-12-23 DIAGNOSIS — Z8572 Personal history of non-Hodgkin lymphomas: Secondary | ICD-10-CM | POA: Diagnosis not present

## 2021-12-23 DIAGNOSIS — M6281 Muscle weakness (generalized): Secondary | ICD-10-CM | POA: Diagnosis not present

## 2021-12-23 DIAGNOSIS — T796XXA Traumatic ischemia of muscle, initial encounter: Secondary | ICD-10-CM | POA: Diagnosis not present

## 2021-12-23 DIAGNOSIS — C778 Secondary and unspecified malignant neoplasm of lymph nodes of multiple regions: Secondary | ICD-10-CM | POA: Diagnosis not present

## 2021-12-23 DIAGNOSIS — R748 Abnormal levels of other serum enzymes: Secondary | ICD-10-CM | POA: Diagnosis not present

## 2021-12-23 DIAGNOSIS — Z87891 Personal history of nicotine dependence: Secondary | ICD-10-CM | POA: Diagnosis not present

## 2021-12-23 DIAGNOSIS — J9600 Acute respiratory failure, unspecified whether with hypoxia or hypercapnia: Secondary | ICD-10-CM | POA: Diagnosis not present

## 2021-12-23 DIAGNOSIS — I480 Paroxysmal atrial fibrillation: Secondary | ICD-10-CM | POA: Diagnosis not present

## 2021-12-23 DIAGNOSIS — Y939 Activity, unspecified: Secondary | ICD-10-CM | POA: Diagnosis not present

## 2021-12-23 DIAGNOSIS — Z591 Inadequate housing, unspecified: Secondary | ICD-10-CM | POA: Diagnosis not present

## 2021-12-23 DIAGNOSIS — S300XXA Contusion of lower back and pelvis, initial encounter: Secondary | ICD-10-CM | POA: Diagnosis not present

## 2021-12-23 DIAGNOSIS — K5790 Diverticulosis of intestine, part unspecified, without perforation or abscess without bleeding: Secondary | ICD-10-CM | POA: Diagnosis not present

## 2021-12-23 DIAGNOSIS — T796XXD Traumatic ischemia of muscle, subsequent encounter: Secondary | ICD-10-CM | POA: Diagnosis not present

## 2021-12-23 DIAGNOSIS — R6889 Other general symptoms and signs: Secondary | ICD-10-CM | POA: Diagnosis not present

## 2021-12-23 DIAGNOSIS — I4891 Unspecified atrial fibrillation: Secondary | ICD-10-CM | POA: Diagnosis not present

## 2021-12-23 DIAGNOSIS — R7989 Other specified abnormal findings of blood chemistry: Secondary | ICD-10-CM | POA: Diagnosis not present

## 2021-12-23 DIAGNOSIS — W19XXXA Unspecified fall, initial encounter: Secondary | ICD-10-CM | POA: Diagnosis not present

## 2021-12-23 DIAGNOSIS — I5030 Unspecified diastolic (congestive) heart failure: Secondary | ICD-10-CM | POA: Diagnosis not present

## 2021-12-23 DIAGNOSIS — S3993XA Unspecified injury of pelvis, initial encounter: Secondary | ICD-10-CM | POA: Diagnosis not present

## 2021-12-23 DIAGNOSIS — R531 Weakness: Secondary | ICD-10-CM | POA: Diagnosis not present

## 2021-12-23 DIAGNOSIS — S0990XA Unspecified injury of head, initial encounter: Secondary | ICD-10-CM | POA: Diagnosis not present

## 2021-12-23 DIAGNOSIS — S199XXA Unspecified injury of neck, initial encounter: Secondary | ICD-10-CM | POA: Diagnosis not present

## 2021-12-23 DIAGNOSIS — R2681 Unsteadiness on feet: Secondary | ICD-10-CM | POA: Diagnosis not present

## 2021-12-23 DIAGNOSIS — R0902 Hypoxemia: Secondary | ICD-10-CM | POA: Diagnosis not present

## 2021-12-23 DIAGNOSIS — Z751 Person awaiting admission to adequate facility elsewhere: Secondary | ICD-10-CM | POA: Diagnosis not present

## 2021-12-23 DIAGNOSIS — I5032 Chronic diastolic (congestive) heart failure: Secondary | ICD-10-CM | POA: Diagnosis not present

## 2021-12-23 DIAGNOSIS — Z882 Allergy status to sulfonamides status: Secondary | ICD-10-CM | POA: Diagnosis not present

## 2021-12-23 DIAGNOSIS — J9601 Acute respiratory failure with hypoxia: Secondary | ICD-10-CM | POA: Diagnosis not present

## 2021-12-23 DIAGNOSIS — M6282 Rhabdomyolysis: Secondary | ICD-10-CM | POA: Diagnosis not present

## 2021-12-23 DIAGNOSIS — E876 Hypokalemia: Secondary | ICD-10-CM | POA: Diagnosis not present

## 2021-12-23 DIAGNOSIS — Y92093 Driveway of other non-institutional residence as the place of occurrence of the external cause: Secondary | ICD-10-CM | POA: Diagnosis not present

## 2021-12-23 DIAGNOSIS — D72829 Elevated white blood cell count, unspecified: Secondary | ICD-10-CM | POA: Diagnosis not present

## 2021-12-23 DIAGNOSIS — Z880 Allergy status to penicillin: Secondary | ICD-10-CM | POA: Diagnosis not present

## 2021-12-23 DIAGNOSIS — Z79899 Other long term (current) drug therapy: Secondary | ICD-10-CM | POA: Diagnosis not present

## 2021-12-29 DIAGNOSIS — Z7901 Long term (current) use of anticoagulants: Secondary | ICD-10-CM | POA: Diagnosis not present

## 2021-12-29 DIAGNOSIS — I110 Hypertensive heart disease with heart failure: Secondary | ICD-10-CM | POA: Diagnosis not present

## 2021-12-29 DIAGNOSIS — M6281 Muscle weakness (generalized): Secondary | ICD-10-CM | POA: Diagnosis not present

## 2021-12-29 DIAGNOSIS — Y939 Activity, unspecified: Secondary | ICD-10-CM | POA: Diagnosis not present

## 2021-12-29 DIAGNOSIS — N179 Acute kidney failure, unspecified: Secondary | ICD-10-CM | POA: Diagnosis not present

## 2021-12-29 DIAGNOSIS — R748 Abnormal levels of other serum enzymes: Secondary | ICD-10-CM | POA: Diagnosis not present

## 2021-12-29 DIAGNOSIS — I4891 Unspecified atrial fibrillation: Secondary | ICD-10-CM | POA: Diagnosis not present

## 2021-12-29 DIAGNOSIS — T796XXD Traumatic ischemia of muscle, subsequent encounter: Secondary | ICD-10-CM | POA: Diagnosis not present

## 2021-12-29 DIAGNOSIS — M6282 Rhabdomyolysis: Secondary | ICD-10-CM | POA: Diagnosis not present

## 2021-12-29 DIAGNOSIS — I5030 Unspecified diastolic (congestive) heart failure: Secondary | ICD-10-CM | POA: Diagnosis not present

## 2021-12-29 DIAGNOSIS — J9600 Acute respiratory failure, unspecified whether with hypoxia or hypercapnia: Secondary | ICD-10-CM | POA: Diagnosis not present

## 2021-12-29 DIAGNOSIS — E876 Hypokalemia: Secondary | ICD-10-CM | POA: Diagnosis not present

## 2021-12-29 DIAGNOSIS — R2681 Unsteadiness on feet: Secondary | ICD-10-CM | POA: Diagnosis not present

## 2021-12-29 DIAGNOSIS — E785 Hyperlipidemia, unspecified: Secondary | ICD-10-CM | POA: Diagnosis not present

## 2021-12-29 DIAGNOSIS — R531 Weakness: Secondary | ICD-10-CM | POA: Diagnosis not present

## 2021-12-29 DIAGNOSIS — R262 Difficulty in walking, not elsewhere classified: Secondary | ICD-10-CM | POA: Diagnosis not present

## 2021-12-29 DIAGNOSIS — Y92093 Driveway of other non-institutional residence as the place of occurrence of the external cause: Secondary | ICD-10-CM | POA: Diagnosis not present

## 2021-12-29 DIAGNOSIS — R5381 Other malaise: Secondary | ICD-10-CM | POA: Diagnosis not present

## 2021-12-29 DIAGNOSIS — C778 Secondary and unspecified malignant neoplasm of lymph nodes of multiple regions: Secondary | ICD-10-CM | POA: Diagnosis not present

## 2021-12-29 DIAGNOSIS — W19XXXA Unspecified fall, initial encounter: Secondary | ICD-10-CM | POA: Diagnosis not present

## 2022-01-01 DIAGNOSIS — E876 Hypokalemia: Secondary | ICD-10-CM | POA: Diagnosis not present

## 2022-01-01 DIAGNOSIS — W19XXXA Unspecified fall, initial encounter: Secondary | ICD-10-CM | POA: Diagnosis not present

## 2022-01-01 DIAGNOSIS — R5381 Other malaise: Secondary | ICD-10-CM | POA: Diagnosis not present

## 2022-01-01 DIAGNOSIS — Y92093 Driveway of other non-institutional residence as the place of occurrence of the external cause: Secondary | ICD-10-CM | POA: Diagnosis not present

## 2022-01-01 DIAGNOSIS — I5030 Unspecified diastolic (congestive) heart failure: Secondary | ICD-10-CM | POA: Diagnosis not present

## 2022-01-01 DIAGNOSIS — Y939 Activity, unspecified: Secondary | ICD-10-CM | POA: Diagnosis not present

## 2022-01-01 DIAGNOSIS — R531 Weakness: Secondary | ICD-10-CM | POA: Diagnosis not present

## 2022-01-01 DIAGNOSIS — E785 Hyperlipidemia, unspecified: Secondary | ICD-10-CM | POA: Diagnosis not present

## 2022-01-01 DIAGNOSIS — I110 Hypertensive heart disease with heart failure: Secondary | ICD-10-CM | POA: Diagnosis not present

## 2022-01-01 DIAGNOSIS — Z7901 Long term (current) use of anticoagulants: Secondary | ICD-10-CM | POA: Diagnosis not present

## 2022-01-01 DIAGNOSIS — I4891 Unspecified atrial fibrillation: Secondary | ICD-10-CM | POA: Diagnosis not present

## 2022-01-05 DIAGNOSIS — R262 Difficulty in walking, not elsewhere classified: Secondary | ICD-10-CM | POA: Diagnosis not present

## 2022-01-05 DIAGNOSIS — M6281 Muscle weakness (generalized): Secondary | ICD-10-CM | POA: Diagnosis not present

## 2022-01-05 DIAGNOSIS — T796XXD Traumatic ischemia of muscle, subsequent encounter: Secondary | ICD-10-CM | POA: Diagnosis not present

## 2022-01-07 ENCOUNTER — Telehealth: Payer: Self-pay | Admitting: *Deleted

## 2022-01-07 NOTE — Patient Instructions (Signed)
Visit Information  Thank you for taking time to visit with me today. Please don't hesitate to contact me if I can be of assistance to you.   Following are the goals we discussed today:   Goals Addressed   None     Please call the care guide team at [phone] if you need to cancel or reschedule your appointment.   If you are experiencing a Mental Health or [place] or need someone to talk to, please call the Suicide and Crisis Lifeline: 988  The patient verbalized understanding of instructions, educational materials, and care plan provided today and DECLINED offer to receive copy of patient instructions, educational materials, and care plan.   No further follow up required: Declined no needs  Raina Mina, RN Care Management Coordinator [place] Office [phone]

## 2022-01-07 NOTE — Patient Outreach (Signed)
  Care Coordination   Initial Visit Note   01/07/2022 Name: [name] MRN: [id number] DOB: 1945/03/25  [name] is a 76 y.o. year old female who sees Burchette, Angela Sierras, MD for primary care. I spoke with  [name] by phone today.  What matters to the patients health and wellness today?  na    Goals Addressed   None     SDOH assessments and interventions completed:  No     Care Coordination Interventions Activated:  No  Care Coordination Interventions:  No, not indicated   Follow up plan: No further intervention required.   Encounter Outcome:  Pt. Refused   Angela Mina, RN Care Management Coordinator [place] Office [phone]

## 2022-01-09 ENCOUNTER — Telehealth: Payer: Self-pay | Admitting: Family Medicine

## 2022-01-09 NOTE — Telephone Encounter (Signed)
[facility]   [place] [phone]  Called to inform MD:  Pt care start date: 01/12/22  Reason for delay:  At patient's request

## 2022-01-09 NOTE — Telephone Encounter (Signed)
Noted  [name] W Zohal Reny MD [facility]  

## 2022-01-12 ENCOUNTER — Telehealth: Payer: Self-pay | Admitting: Family Medicine

## 2022-01-12 DIAGNOSIS — G510 Bell's palsy: Secondary | ICD-10-CM | POA: Diagnosis not present

## 2022-01-12 DIAGNOSIS — E559 Vitamin D deficiency, unspecified: Secondary | ICD-10-CM | POA: Diagnosis not present

## 2022-01-12 DIAGNOSIS — K219 Gastro-esophageal reflux disease without esophagitis: Secondary | ICD-10-CM | POA: Diagnosis not present

## 2022-01-12 DIAGNOSIS — Z87891 Personal history of nicotine dependence: Secondary | ICD-10-CM | POA: Diagnosis not present

## 2022-01-12 DIAGNOSIS — I5030 Unspecified diastolic (congestive) heart failure: Secondary | ICD-10-CM | POA: Diagnosis not present

## 2022-01-12 DIAGNOSIS — M5136 Other intervertebral disc degeneration, lumbar region: Secondary | ICD-10-CM | POA: Diagnosis not present

## 2022-01-12 DIAGNOSIS — M1990 Unspecified osteoarthritis, unspecified site: Secondary | ICD-10-CM | POA: Diagnosis not present

## 2022-01-12 DIAGNOSIS — E538 Deficiency of other specified B group vitamins: Secondary | ICD-10-CM | POA: Diagnosis not present

## 2022-01-12 DIAGNOSIS — Z8572 Personal history of non-Hodgkin lymphomas: Secondary | ICD-10-CM | POA: Diagnosis not present

## 2022-01-12 DIAGNOSIS — J309 Allergic rhinitis, unspecified: Secondary | ICD-10-CM | POA: Diagnosis not present

## 2022-01-12 DIAGNOSIS — Z7901 Long term (current) use of anticoagulants: Secondary | ICD-10-CM | POA: Diagnosis not present

## 2022-01-12 DIAGNOSIS — T796XXD Traumatic ischemia of muscle, subsequent encounter: Secondary | ICD-10-CM | POA: Diagnosis not present

## 2022-01-12 DIAGNOSIS — Z8673 Personal history of transient ischemic attack (TIA), and cerebral infarction without residual deficits: Secondary | ICD-10-CM | POA: Diagnosis not present

## 2022-01-12 DIAGNOSIS — E785 Hyperlipidemia, unspecified: Secondary | ICD-10-CM | POA: Diagnosis not present

## 2022-01-12 DIAGNOSIS — I110 Hypertensive heart disease with heart failure: Secondary | ICD-10-CM | POA: Diagnosis not present

## 2022-01-12 DIAGNOSIS — R350 Frequency of micturition: Secondary | ICD-10-CM | POA: Diagnosis not present

## 2022-01-12 DIAGNOSIS — I4821 Permanent atrial fibrillation: Secondary | ICD-10-CM | POA: Diagnosis not present

## 2022-01-12 DIAGNOSIS — I420 Dilated cardiomyopathy: Secondary | ICD-10-CM | POA: Diagnosis not present

## 2022-01-12 DIAGNOSIS — Z9181 History of falling: Secondary | ICD-10-CM | POA: Diagnosis not present

## 2022-01-12 DIAGNOSIS — F32A Depression, unspecified: Secondary | ICD-10-CM | POA: Diagnosis not present

## 2022-01-12 DIAGNOSIS — Z86718 Personal history of other venous thrombosis and embolism: Secondary | ICD-10-CM | POA: Diagnosis not present

## 2022-01-12 DIAGNOSIS — I340 Nonrheumatic mitral (valve) insufficiency: Secondary | ICD-10-CM | POA: Diagnosis not present

## 2022-01-12 NOTE — Telephone Encounter (Signed)
Lauren PT [facility] is calling and would like VO for PT 2x4, 1x5

## 2022-01-13 NOTE — Telephone Encounter (Signed)
Lauren informed of verbal orders.

## 2022-01-21 ENCOUNTER — Telehealth: Payer: Self-pay | Admitting: Family Medicine

## 2022-01-21 ENCOUNTER — Ambulatory Visit (INDEPENDENT_AMBULATORY_CARE_PROVIDER_SITE_OTHER): Payer: Medicare Other | Admitting: Family Medicine

## 2022-01-21 ENCOUNTER — Encounter: Payer: Self-pay | Admitting: Family Medicine

## 2022-01-21 VITALS — BP 130/80 | HR 85 | Temp 97.6°F | Ht 66.0 in | Wt 183.5 lb

## 2022-01-21 DIAGNOSIS — R7401 Elevation of levels of liver transaminase levels: Secondary | ICD-10-CM | POA: Diagnosis not present

## 2022-01-21 DIAGNOSIS — N289 Disorder of kidney and ureter, unspecified: Secondary | ICD-10-CM

## 2022-01-21 DIAGNOSIS — N2889 Other specified disorders of kidney and ureter: Secondary | ICD-10-CM

## 2022-01-21 DIAGNOSIS — T796XXD Traumatic ischemia of muscle, subsequent encounter: Secondary | ICD-10-CM

## 2022-01-21 NOTE — Patient Instructions (Signed)
I will be ordering MRI of kidney to further assess

## 2022-01-21 NOTE — Progress Notes (Signed)
Established Patient Office Visit  Subjective   Patient ID: [name], female    DOB: 1945-09-12  Age: 76 y.o. MRN: [id number]  Chief Complaint  Patient presents with   Hospitalization Follow-up    HPI   Here for hospital follow-up.  She had recent fall at home in her driveway on [id number].  She has a concrete driveway and there was an uneven place where the pavement came up and she recalls falling and basically had to crawl her way on her hands to the house.  She was in her garage apparently for several hours and eventually made it into the house.  She states that sometime in the early morning hours of 3 October she fell asleep on the floor and her neighbor apparently discovered her the next morning when she did not respond to a call.  She had some confusion.  911 was called.  She was admitted with traumatic rhabdomyolysis.  She had extensive work-up.  CT head and C-spine no acute abnormalities.  CT chest unremarkable.  CT abdomen revealed 1.2 cm left renal mass with recommended outpatient MRI.  She had acute renal failure related to her rhabdomyolysis.  creatinine kinase was up over 21,000.  She had GFR of 31 and this did improve with hydration to 77.  She gradually improved.  Also had hypokalemia which was corrected.  Was discharged to rehab for 1 week and now back at home getting outpatient physical therapy and Occupational Therapy. She has had balance difficulties for quite some time  Past Medical History:  Diagnosis Date   ABDOMINAL PAIN RIGHT UPPER QUADRANT 11/08/2008   ALLERGIC RHINITIS 08/01/2009   Anxiety    Aortic stenosis    mild by echo 10/2021   Cataract    Complication of anesthesia 01/31/2015   ? doesn't remember anything after being taken to pre surgery   Coronary artery calcification seen on CAT scan 05/30/2009   moderate risk coronary calcium score by chest CT with mid LAD plaque>>patient opted for medical management   DCM (dilated cardiomyopathy) ([place])     EF 45-50% by echo 8/23   DDD (degenerative disc disease), lumbar    DEPRESSION 06/13/2008   DYSLIPIDEMIA 02/27/2009   DYSPNEA 02/27/2009   Dysrhythmia    Afib   Frequency of urination    GERD (gastroesophageal reflux disease)    "years ago"   Head injury 2016   did not loose consciousness   History of Bell's palsy    History of DVT of lower extremity 2006-- CHRONIC COUMADIN THERAPY   History of hiatal hernia    History of Lyme disease    Hydronephrosis, left chronic -- secondary to retroperitoneal fibrosis   Hypertension    Lyme disease    NON-HODGKIN'S LYMPHOMA, HX OF dx  2005--  chemoradiation completed 2006--  no recurrence   onocologist- dr odogwu--    OSTEOARTHRITIS, MODERATE 04/07/2010   Permanent atrial fibrillation ([place])    On chronic anti-coagulation with warfarin   Pneumonia    x 3 - last time 02/2018   Retroperitoneal fibrosis    Stroke ([place])    ? - [place]- "you have had 3 strokes"   Past Surgical History:  Procedure Laterality Date   BREAST MASS EXCISION     BENIGN-- RIGHT BREAST   CARDIOVASCULAR STRESS TEST  04-04-2009-- PER PT ASYMPTOMATIC-- MEDICAL MANAGEMENT   STUDY WAS EQUIVOCAL/ EF 49%/ GLOBAL HYPOKINESIS/ APPEARS TO BE A MILD ANTERIOR PERFUSION DEFECT COULD REPRESENT ISCHEMIA BUT DUE TO  ACTIVITY WORSE IN STRESS THAN AT REST POSS. THE DEFECT WAS ARTIFACTUAL   CARDIOVERSION N/A 07/11/2015   Procedure: CARDIOVERSION;  Surgeon: Larey Dresser, MD;  Location: [place];  Service: Cardiovascular;  Laterality: N/A;   CT ANGIOGRAM  FEB 2011   CALCIUM SCORE 161/ POSS. MODERATE MID LAD STENOSIS   CYSTOSCOPY W/ RETROGRADES  04/28/2011   Procedure: CYSTOSCOPY WITH RETROGRADE PYELOGRAM;  Surgeon: Fredricka Bonine, MD;  Location: [facility];  Service: Urology;  Laterality: Left;   CYSTOSCOPY W/ URETERAL STENT REMOVAL  04/28/2011   Procedure: CYSTOSCOPY WITH STENT REMOVAL;  Surgeon: Fredricka Bonine, MD;  [facility];  Service: Urology;;   EXPLORATORY LAPAROTOMY  2005   LYMPHOMA   EYE SURGERY Bilateral    cataract    GAS INSERTION Right 03/31/2018   Procedure: RIGHT EYE INSERTION OF GAS C3F8;  Surgeon: Bernarda Caffey, MD;  Location: [place];  Service: Ophthalmology;  Laterality: Right;   KNEE ARTHROSCOPY  2005   RIGHT   LASER PHOTO ABLATION Right 03/31/2018   Procedure: RIGHT EYE LASER PHOTO ABLATION;  Surgeon: Bernarda Caffey, MD;  Location: [place];  Service: Ophthalmology;  Laterality: Right;   MULTIPLE CYSTO/ LEFT URETERAL STENT EXCHANGES  LAST ONE 10-28-10   RETROPERITONEAL BX  2007   REVERSE SHOULDER ARTHROPLASTY Right 01/31/2015   Procedure: RIGHT REVERSE SHOULDER ARTHROPLASTY;  Surgeon: Justice Britain, MD;  Location: [place];  Service: Orthopedics;  Laterality: Right;   TONSILLECTOMY  CHILD   TOTAL KNEE ARTHROPLASTY  OCT 2010   RIGHT   TOTAL KNEE ARTHROPLASTY  JAN 2010   LEFT   TRANSTHORACIC ECHOCARDIOGRAM  09-38-1829   MILD SYSTOLIC DYSFUNCTION/ EF 93-71%/ MODERATE DIASTOLIC DYSFUCTION/ MILD MR/ MILD BIATRIAL ENLARGEMENT   VITRECTOMY 25 GAUGE WITH SCLERAL BUCKLE Right 03/31/2018   Procedure: RIGHT VITRECTOMY 25 GAUGE WITH SCLERAL BUCKLE;  Surgeon: Bernarda Caffey, MD;  Location: [place];  Service: Ophthalmology;  Laterality: Right;    reports that she quit smoking about 13 years ago. Her smoking use included cigarettes. She has a 104.00 pack-year smoking history. She has never used smokeless tobacco. She reports that she does not currently use alcohol. She reports that she does not use drugs. family history includes Arthritis in her mother; Diabetes in her sister; Heart attack in her mother and sister; Heart disease in her mother; Hypertension in her sister; Liver disease in her son; Stroke in her sister. Allergies  Allergen Reactions   Chlorhexidine     Other reaction(s): Not available   Chlorhexidine Base Itching    CHG WIPES   Clindamycin/Lincomycin Other (See Comments)    Caused fire like  sensation in her stomach    Sulfa Antibiotics Other (See Comments)    Severe GI upset   Penicillins Hives and Rash    DID THE REACTION INVOLVE: Swelling of the face/tongue/throat, SOB, or low BP? No Sudden or severe rash/hives, skin peeling, or the inside of the mouth or nose? No Did it require medical treatment? No When did it last happen?  90s If all above answers are "NO", may proceed with cephalosporin use.     Review of Systems  Constitutional:  Negative for chills, fever and malaise/fatigue.  Eyes:  Negative for blurred vision.  Respiratory:  Negative for shortness of breath.   Cardiovascular:  Negative for chest pain.  Gastrointestinal:  Negative for abdominal pain.  Genitourinary:  Negative for dysuria.  Musculoskeletal:  Negative for myalgias.  Neurological:  Negative for dizziness,  weakness and headaches.      Objective:     BP 130/80 (BP Location: Left Arm, Patient Position: Sitting, Cuff Size: Normal)   Pulse 85   Temp 97.6 F (36.4 C) (Oral)   Ht '5\' 6"'$  (1.676 m)   Wt 183 lb 8 oz (83.2 kg)   SpO2 97%   BMI 29.62 kg/m    Physical Exam Vitals reviewed.  Cardiovascular:     Rate and Rhythm: Normal rate.  Pulmonary:     Effort: Pulmonary effort is normal.     Breath sounds: Normal breath sounds. No wheezing or rales.  Musculoskeletal:     Right lower leg: No edema.     Left lower leg: No edema.  Neurological:     General: No focal deficit present.     Mental Status: She is alert and oriented to person, place, and time.     Comments: Ambulates with a cane.  Very poor balance.  Psychiatric:        Mood and Affect: Mood normal.        Thought Content: Thought content normal.      No results found for any visits on 01/21/22.    The 10-year ASCVD risk score (Arnett DK, et al., 2019) is: 26.3%    Assessment & Plan:   Problem List Items Addressed This Visit   None Visit Diagnoses     Traumatic rhabdomyolysis, subsequent encounter    -  Primary    Relevant Orders   CMP   Transaminasemia       Relevant Orders   CMP   Left renal mass         Recent admission with fall and subsequent traumatic rhabdomyolysis with elevated transaminase likely related to the rhabdo.  She had evaluation for infectious hepatitis which was negative.  She had acute renal failure also related to her rhabdomyolysis and this was improving with hydration.  Mild hypokalemia.  -Recheck comprehensive metabolic panel  - outpatient physical therapy and OT  -Recommend she consider some type of emergency alert system in the event of future fall  -Set up MRI kidney to further evaluate left renal mass  Return in about 3 months (around 04/23/2022).    Carolann Littler, MD

## 2022-01-21 NOTE — Telephone Encounter (Signed)
Patient missed appointment, will resume Friday, no concerns

## 2022-01-22 LAB — COMPREHENSIVE METABOLIC PANEL
ALT: 16 U/L (ref 0–35)
AST: 23 U/L (ref 0–37)
Albumin: 4.3 g/dL (ref 3.5–5.2)
Alkaline Phosphatase: 69 U/L (ref 39–117)
BUN: 32 mg/dL — ABNORMAL HIGH (ref 6–23)
CO2: 28 mEq/L (ref 19–32)
Calcium: 9.5 mg/dL (ref 8.4–10.5)
Chloride: 104 mEq/L (ref 96–112)
Creatinine, Ser: 1.46 mg/dL — ABNORMAL HIGH (ref 0.40–1.20)
GFR: 34.42 mL/min — ABNORMAL LOW (ref >60.00)
Glucose, Bld: 168 mg/dL — ABNORMAL HIGH (ref 70–99)
Potassium: 4.3 mEq/L (ref 3.5–5.1)
Sodium: 141 mEq/L (ref 135–145)
Total Bilirubin: 0.4 mg/dL (ref 0.2–1.2)
Total Protein: 7.2 g/dL (ref 6.0–8.3)

## 2022-01-23 DIAGNOSIS — M1990 Unspecified osteoarthritis, unspecified site: Secondary | ICD-10-CM | POA: Diagnosis not present

## 2022-01-23 DIAGNOSIS — M5136 Other intervertebral disc degeneration, lumbar region: Secondary | ICD-10-CM | POA: Diagnosis not present

## 2022-01-23 DIAGNOSIS — I5030 Unspecified diastolic (congestive) heart failure: Secondary | ICD-10-CM | POA: Diagnosis not present

## 2022-01-23 DIAGNOSIS — F32A Depression, unspecified: Secondary | ICD-10-CM | POA: Diagnosis not present

## 2022-01-23 DIAGNOSIS — Z87891 Personal history of nicotine dependence: Secondary | ICD-10-CM | POA: Diagnosis not present

## 2022-01-23 DIAGNOSIS — E559 Vitamin D deficiency, unspecified: Secondary | ICD-10-CM | POA: Diagnosis not present

## 2022-01-23 DIAGNOSIS — K219 Gastro-esophageal reflux disease without esophagitis: Secondary | ICD-10-CM | POA: Diagnosis not present

## 2022-01-23 DIAGNOSIS — Z9181 History of falling: Secondary | ICD-10-CM | POA: Diagnosis not present

## 2022-01-23 DIAGNOSIS — E785 Hyperlipidemia, unspecified: Secondary | ICD-10-CM | POA: Diagnosis not present

## 2022-01-23 DIAGNOSIS — I420 Dilated cardiomyopathy: Secondary | ICD-10-CM | POA: Diagnosis not present

## 2022-01-23 DIAGNOSIS — Z86718 Personal history of other venous thrombosis and embolism: Secondary | ICD-10-CM | POA: Diagnosis not present

## 2022-01-23 DIAGNOSIS — G510 Bell's palsy: Secondary | ICD-10-CM | POA: Diagnosis not present

## 2022-01-23 DIAGNOSIS — I110 Hypertensive heart disease with heart failure: Secondary | ICD-10-CM | POA: Diagnosis not present

## 2022-01-23 DIAGNOSIS — I340 Nonrheumatic mitral (valve) insufficiency: Secondary | ICD-10-CM | POA: Diagnosis not present

## 2022-01-23 DIAGNOSIS — I4821 Permanent atrial fibrillation: Secondary | ICD-10-CM | POA: Diagnosis not present

## 2022-01-23 DIAGNOSIS — Z8572 Personal history of non-Hodgkin lymphomas: Secondary | ICD-10-CM | POA: Diagnosis not present

## 2022-01-23 DIAGNOSIS — E538 Deficiency of other specified B group vitamins: Secondary | ICD-10-CM | POA: Diagnosis not present

## 2022-01-23 DIAGNOSIS — Z7901 Long term (current) use of anticoagulants: Secondary | ICD-10-CM | POA: Diagnosis not present

## 2022-01-23 DIAGNOSIS — Z8673 Personal history of transient ischemic attack (TIA), and cerebral infarction without residual deficits: Secondary | ICD-10-CM | POA: Diagnosis not present

## 2022-01-23 DIAGNOSIS — T796XXD Traumatic ischemia of muscle, subsequent encounter: Secondary | ICD-10-CM | POA: Diagnosis not present

## 2022-01-23 DIAGNOSIS — J309 Allergic rhinitis, unspecified: Secondary | ICD-10-CM | POA: Diagnosis not present

## 2022-01-23 DIAGNOSIS — R350 Frequency of micturition: Secondary | ICD-10-CM | POA: Diagnosis not present

## 2022-01-23 NOTE — Addendum Note (Signed)
Addended by: Nilda Riggs on: 01/23/2022 01:39 PM   Modules accepted: Orders

## 2022-01-26 ENCOUNTER — Telehealth: Payer: Self-pay | Admitting: Family Medicine

## 2022-01-26 NOTE — Telephone Encounter (Signed)
error 

## 2022-02-04 DIAGNOSIS — R350 Frequency of micturition: Secondary | ICD-10-CM | POA: Diagnosis not present

## 2022-02-04 DIAGNOSIS — Z8673 Personal history of transient ischemic attack (TIA), and cerebral infarction without residual deficits: Secondary | ICD-10-CM | POA: Diagnosis not present

## 2022-02-04 DIAGNOSIS — Z7901 Long term (current) use of anticoagulants: Secondary | ICD-10-CM | POA: Diagnosis not present

## 2022-02-04 DIAGNOSIS — M5136 Other intervertebral disc degeneration, lumbar region: Secondary | ICD-10-CM | POA: Diagnosis not present

## 2022-02-04 DIAGNOSIS — M1990 Unspecified osteoarthritis, unspecified site: Secondary | ICD-10-CM | POA: Diagnosis not present

## 2022-02-04 DIAGNOSIS — K219 Gastro-esophageal reflux disease without esophagitis: Secondary | ICD-10-CM | POA: Diagnosis not present

## 2022-02-04 DIAGNOSIS — E538 Deficiency of other specified B group vitamins: Secondary | ICD-10-CM | POA: Diagnosis not present

## 2022-02-04 DIAGNOSIS — J309 Allergic rhinitis, unspecified: Secondary | ICD-10-CM | POA: Diagnosis not present

## 2022-02-04 DIAGNOSIS — Z86718 Personal history of other venous thrombosis and embolism: Secondary | ICD-10-CM | POA: Diagnosis not present

## 2022-02-04 DIAGNOSIS — T796XXD Traumatic ischemia of muscle, subsequent encounter: Secondary | ICD-10-CM | POA: Diagnosis not present

## 2022-02-04 DIAGNOSIS — I340 Nonrheumatic mitral (valve) insufficiency: Secondary | ICD-10-CM | POA: Diagnosis not present

## 2022-02-04 DIAGNOSIS — G510 Bell's palsy: Secondary | ICD-10-CM | POA: Diagnosis not present

## 2022-02-04 DIAGNOSIS — I420 Dilated cardiomyopathy: Secondary | ICD-10-CM | POA: Diagnosis not present

## 2022-02-04 DIAGNOSIS — I4821 Permanent atrial fibrillation: Secondary | ICD-10-CM | POA: Diagnosis not present

## 2022-02-04 DIAGNOSIS — I110 Hypertensive heart disease with heart failure: Secondary | ICD-10-CM | POA: Diagnosis not present

## 2022-02-04 DIAGNOSIS — F32A Depression, unspecified: Secondary | ICD-10-CM | POA: Diagnosis not present

## 2022-02-04 DIAGNOSIS — Z87891 Personal history of nicotine dependence: Secondary | ICD-10-CM | POA: Diagnosis not present

## 2022-02-04 DIAGNOSIS — Z9181 History of falling: Secondary | ICD-10-CM | POA: Diagnosis not present

## 2022-02-04 DIAGNOSIS — E559 Vitamin D deficiency, unspecified: Secondary | ICD-10-CM | POA: Diagnosis not present

## 2022-02-04 DIAGNOSIS — Z8572 Personal history of non-Hodgkin lymphomas: Secondary | ICD-10-CM | POA: Diagnosis not present

## 2022-02-04 DIAGNOSIS — I5030 Unspecified diastolic (congestive) heart failure: Secondary | ICD-10-CM | POA: Diagnosis not present

## 2022-02-04 DIAGNOSIS — E785 Hyperlipidemia, unspecified: Secondary | ICD-10-CM | POA: Diagnosis not present

## 2022-02-06 ENCOUNTER — Other Ambulatory Visit: Payer: Medicare Other

## 2022-02-06 ENCOUNTER — Ambulatory Visit (HOSPITAL_BASED_OUTPATIENT_CLINIC_OR_DEPARTMENT_OTHER)
Admission: RE | Admit: 2022-02-06 | Discharge: 2022-02-06 | Disposition: A | Discharge status: Home / Self Care | Payer: Medicare Other | Source: Ambulatory Visit | Attending: Family Medicine | Admitting: Family Medicine

## 2022-02-06 DIAGNOSIS — N2889 Other specified disorders of kidney and ureter: Secondary | ICD-10-CM

## 2022-02-06 DIAGNOSIS — N281 Cyst of kidney, acquired: Secondary | ICD-10-CM | POA: Insufficient documentation

## 2022-02-06 DIAGNOSIS — Z8572 Personal history of non-Hodgkin lymphomas: Secondary | ICD-10-CM | POA: Diagnosis not present

## 2022-02-06 MED ORDER — GADOBUTROL 1 MMOL/ML IV SOLN
8.3000 mL | Freq: Once | INTRAVENOUS | Status: AC | PRN
  Administered 2022-02-06: 8.3 mL via INTRAVENOUS
  Filled 2022-02-06: qty 10

## 2022-02-10 ENCOUNTER — Telehealth: Payer: Self-pay | Admitting: Family Medicine

## 2022-02-10 NOTE — Telephone Encounter (Signed)
[name] from [place]  [phone]  FYI  Pt missed visit this week.  Pt is in [place].

## 2022-02-10 NOTE — Telephone Encounter (Signed)
noted 

## 2022-02-11 ENCOUNTER — Other Ambulatory Visit: Payer: Medicare Other

## 2022-02-11 DIAGNOSIS — N289 Disorder of kidney and ureter, unspecified: Secondary | ICD-10-CM

## 2022-02-11 LAB — BASIC METABOLIC PANEL
BUN: 20 mg/dL (ref 6–23)
CO2: 28 mEq/L (ref 19–32)
Calcium: 9.2 mg/dL (ref 8.4–10.5)
Chloride: 104 mEq/L (ref 96–112)
Creatinine, Ser: 1.26 mg/dL — ABNORMAL HIGH (ref 0.40–1.20)
GFR: 41.06 mL/min — ABNORMAL LOW (ref >60.00)
Glucose, Bld: 112 mg/dL — ABNORMAL HIGH (ref 70–99)
Potassium: 4.4 mEq/L (ref 3.5–5.1)
Sodium: 139 mEq/L (ref 135–145)

## 2022-02-16 ENCOUNTER — Telehealth: Payer: Self-pay | Admitting: Family Medicine

## 2022-02-16 NOTE — Telephone Encounter (Signed)
See result note.  

## 2022-02-16 NOTE — Progress Notes (Signed)
Noted  Melayna Robarts W Hadi Dubin MD [facility]  

## 2022-02-16 NOTE — Telephone Encounter (Signed)
Pt called to go over her lab results from 02/11/22.  Please return her call.

## 2022-02-19 DIAGNOSIS — K219 Gastro-esophageal reflux disease without esophagitis: Secondary | ICD-10-CM | POA: Diagnosis not present

## 2022-02-19 DIAGNOSIS — Z87891 Personal history of nicotine dependence: Secondary | ICD-10-CM | POA: Diagnosis not present

## 2022-02-19 DIAGNOSIS — Z86718 Personal history of other venous thrombosis and embolism: Secondary | ICD-10-CM | POA: Diagnosis not present

## 2022-02-19 DIAGNOSIS — M5136 Other intervertebral disc degeneration, lumbar region: Secondary | ICD-10-CM | POA: Diagnosis not present

## 2022-02-19 DIAGNOSIS — I5030 Unspecified diastolic (congestive) heart failure: Secondary | ICD-10-CM | POA: Diagnosis not present

## 2022-02-19 DIAGNOSIS — I420 Dilated cardiomyopathy: Secondary | ICD-10-CM | POA: Diagnosis not present

## 2022-02-19 DIAGNOSIS — M1990 Unspecified osteoarthritis, unspecified site: Secondary | ICD-10-CM | POA: Diagnosis not present

## 2022-02-19 DIAGNOSIS — Z8572 Personal history of non-Hodgkin lymphomas: Secondary | ICD-10-CM | POA: Diagnosis not present

## 2022-02-19 DIAGNOSIS — R350 Frequency of micturition: Secondary | ICD-10-CM | POA: Diagnosis not present

## 2022-02-19 DIAGNOSIS — E538 Deficiency of other specified B group vitamins: Secondary | ICD-10-CM | POA: Diagnosis not present

## 2022-02-19 DIAGNOSIS — J309 Allergic rhinitis, unspecified: Secondary | ICD-10-CM | POA: Diagnosis not present

## 2022-02-19 DIAGNOSIS — T796XXD Traumatic ischemia of muscle, subsequent encounter: Secondary | ICD-10-CM | POA: Diagnosis not present

## 2022-02-19 DIAGNOSIS — G510 Bell's palsy: Secondary | ICD-10-CM | POA: Diagnosis not present

## 2022-02-19 DIAGNOSIS — E785 Hyperlipidemia, unspecified: Secondary | ICD-10-CM | POA: Diagnosis not present

## 2022-02-19 DIAGNOSIS — Z7901 Long term (current) use of anticoagulants: Secondary | ICD-10-CM | POA: Diagnosis not present

## 2022-02-19 DIAGNOSIS — Z9181 History of falling: Secondary | ICD-10-CM | POA: Diagnosis not present

## 2022-02-19 DIAGNOSIS — F32A Depression, unspecified: Secondary | ICD-10-CM | POA: Diagnosis not present

## 2022-02-19 DIAGNOSIS — I340 Nonrheumatic mitral (valve) insufficiency: Secondary | ICD-10-CM | POA: Diagnosis not present

## 2022-02-19 DIAGNOSIS — E559 Vitamin D deficiency, unspecified: Secondary | ICD-10-CM | POA: Diagnosis not present

## 2022-02-19 DIAGNOSIS — I110 Hypertensive heart disease with heart failure: Secondary | ICD-10-CM | POA: Diagnosis not present

## 2022-02-19 DIAGNOSIS — I4821 Permanent atrial fibrillation: Secondary | ICD-10-CM | POA: Diagnosis not present

## 2022-02-19 DIAGNOSIS — Z8673 Personal history of transient ischemic attack (TIA), and cerebral infarction without residual deficits: Secondary | ICD-10-CM | POA: Diagnosis not present

## 2022-02-19 NOTE — Telephone Encounter (Signed)
FYI:  Audelia Acton called back to say:    As per Pt's request, Pt is being discharged from services.

## 2022-02-25 ENCOUNTER — Ambulatory Visit: Payer: Medicare Other | Admitting: Podiatry

## 2022-03-02 ENCOUNTER — Ambulatory Visit: Payer: Medicare Other | Admitting: Podiatry

## 2022-03-06 ENCOUNTER — Other Ambulatory Visit: Payer: Self-pay | Admitting: Family Medicine

## 2022-04-03 ENCOUNTER — Other Ambulatory Visit: Payer: Self-pay | Admitting: Family Medicine

## 2022-04-03 ENCOUNTER — Telehealth: Payer: Self-pay | Admitting: Family Medicine

## 2022-04-03 MED ORDER — VALACYCLOVIR HCL 1 G PO TABS: ORAL_TABLET | ORAL | 0 refills | Status: DC

## 2022-04-03 NOTE — Telephone Encounter (Signed)
Rx sent 

## 2022-04-03 NOTE — Telephone Encounter (Signed)
Pt is calling and would like a refill on valACYclovir (VALTREX) 1000 MG tablet  [id number]: [phone] Fax: [phone]

## 2022-04-15 ENCOUNTER — Other Ambulatory Visit: Payer: Self-pay | Admitting: Family Medicine

## 2022-04-15 DIAGNOSIS — F324 Major depressive disorder, single episode, in partial remission: Secondary | ICD-10-CM

## 2022-05-05 ENCOUNTER — Telehealth: Payer: Self-pay | Admitting: Cardiology

## 2022-05-05 DIAGNOSIS — I4821 Permanent atrial fibrillation: Secondary | ICD-10-CM

## 2022-05-05 MED ORDER — APIXABAN 5 MG PO TABS: 5.0000 mg | ORAL_TABLET | Freq: Two times a day (BID) | ORAL | 1 refills | Status: DC

## 2022-05-05 NOTE — Telephone Encounter (Signed)
Eliquis 27m refill request received. Patient is 77years old, weight-83.2kg, Crea-1.26 on 02/11/2022, Diagnosis-Afib, and last seen by Dr. TRadford Paxon 10/07/21. Dose is appropriate based on dosing criteria. Will send in refill to requested pharmacy.

## 2022-05-05 NOTE — Telephone Encounter (Signed)
Pt c/o medication issue:  1. Name of Medication:  apixaban (ELIQUIS) 5 MG TABS tablet  2. How are you currently taking this medication (dosage and times per day)?   3. Are you having a reaction (difficulty breathing--STAT)?   4. What is your medication issue?   Please update note on prescription instructions to prevent future refills being denied. Patient is not currently overdue for follow up. She is also scheduled for 7/29 at 3:20 PM with Dr. Radford Pax.

## 2022-05-07 DIAGNOSIS — H353132 Nonexudative age-related macular degeneration, bilateral, intermediate dry stage: Secondary | ICD-10-CM | POA: Diagnosis not present

## 2022-05-25 ENCOUNTER — Encounter: Payer: Self-pay | Admitting: Podiatry

## 2022-05-25 ENCOUNTER — Ambulatory Visit: Payer: Medicare Other | Admitting: Podiatry

## 2022-05-25 VITALS — BP 126/65

## 2022-05-25 DIAGNOSIS — B351 Tinea unguium: Secondary | ICD-10-CM

## 2022-05-25 DIAGNOSIS — D689 Coagulation defect, unspecified: Secondary | ICD-10-CM | POA: Diagnosis not present

## 2022-05-25 DIAGNOSIS — L84 Corns and callosities: Secondary | ICD-10-CM | POA: Diagnosis not present

## 2022-05-25 DIAGNOSIS — M79676 Pain in unspecified toe(s): Secondary | ICD-10-CM

## 2022-05-25 NOTE — Progress Notes (Unsigned)
  Subjective:  Patient ID: [name], female    DOB: 1946/03/21,  MRN: [id number]  [name] presents to clinic today for {jgcomplaint:23593}  Chief Complaint  Patient presents with   Nail Problem    [facility] PCP-[facility] PCP [facility]- can not remember    New problem(s): None. {jgcomplaint:23593}  PCP is [name], MD.  Allergies  Allergen Reactions   Chlorhexidine     Other reaction(s): Not available   Chlorhexidine Base Itching    CHG WIPES   Clindamycin/Lincomycin Other (See Comments)    Caused fire like sensation in her stomach    Sulfa Antibiotics Other (See Comments)    Severe GI upset   Penicillins Hives and Rash    DID THE REACTION INVOLVE: Swelling of the face/tongue/throat, SOB, or low BP? No Sudden or severe rash/hives, skin peeling, or the inside of the mouth or nose? No Did it require medical treatment? No When did it last happen?  90s If all above answers are "NO", may proceed with cephalosporin use.     Review of Systems: Negative except as noted in the HPI.  Objective: No changes noted in today's physical examination. Vitals:   05/25/22 0939  BP: 126/65   [name] is a pleasant 77 y.o. female {jgbodyhabitus:24098} AAO x 3. Vascular Capillary fill time to digits immediate b/l.  DP/PT pulse(s) are palpable b/l lower extremities. Pedal hair sparse. Lower extremity skin temperature gradient within normal limits. No pain with calf compression b/l. No edema noted b/l lower extremities. No cyanosis or clubbing noted.   Neurologic Protective sensation intact 5/5 intact bilaterally with 10g monofilament b/l. Vibratory sensation intact b/l. No clonus b/l.   Dermatologic Pedal skin is warm and supple b/l.  No open wounds b/l lower extremities. No interdigital macerations b/l lower extremities.   Toenails 1-5 b/l elongated, discolored, dystrophic, thickened, crumbly with subungual debris and tenderness to dorsal palpation. Incurvated  nailplate lateral border right hallux.  Nail border hypertrophy minimal. There is tenderness to palpation. Sign(s) of infection: no clinical signs of infection noted on examination today.  Hyperkeratotic lesion(s) distal tip of left 2nd toe, distal tip of right 2nd toe, distal tip of left 3rd toe, and distal tip of right 3rd toe.  No erythema, no edema, no drainage, no fluctuance.  Orthopedic: Normal muscle strength 5/5 to all lower extremity muscle groups bilaterally. Patient ambulates independent of any assistive aids. 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   Assessment/Plan: 1. Pain due to onychomycosis of toenail   2. Pre-ulcerative corn or callous   3. Clotting disorder ([place])     No orders of the defined types were placed in this encounter.   None {Jgplan:23602::"-Patient/POA to call should there be question/concern in the interim."}   Return in about 3 months (around 08/25/2022).  Marzetta Board, DPM

## 2022-06-16 ENCOUNTER — Ambulatory Visit: Payer: Medicare Other | Admitting: Podiatry

## 2022-07-04 ENCOUNTER — Other Ambulatory Visit: Payer: Self-pay | Admitting: Family Medicine

## 2022-07-13 ENCOUNTER — Ambulatory Visit (INDEPENDENT_AMBULATORY_CARE_PROVIDER_SITE_OTHER): Payer: Medicare Other

## 2022-07-13 VITALS — Ht 66.0 in | Wt 185.0 lb

## 2022-07-13 DIAGNOSIS — Z0000 Encounter for general adult medical examination without abnormal findings: Secondary | ICD-10-CM

## 2022-07-13 DIAGNOSIS — Z122 Encounter for screening for malignant neoplasm of respiratory organs: Secondary | ICD-10-CM | POA: Diagnosis not present

## 2022-07-13 NOTE — Patient Instructions (Addendum)
[name] , Thank you for taking time to come for your Medicare Wellness Visit. I appreciate your ongoing commitment to your health goals. Please review the following plan we discussed and let me know if I can assist you in the future.   These are the goals we discussed:  Goals       Exercise 3x per week (30 min per time)      No current goals (pt-stated)      Patient Stated (pt-stated)      Will remain healthy Continue to be able to live independently.       Pharmacy care plan      CARE PLAN ENTRY (see longitudinal plan of care for additional care plan information)  Current Barriers:  Chronic Disease Management support, education, and care coordination needs related to Hypertension, Hyperlipidemia, Atrial Fibrillation, Depression, and Anxiety   Hypertension BP Readings from Last 3 Encounters:  03/11/20 118/78  08/23/19 107/62  08/10/19 130/72  Pharmacist Clinical Goal(s): Over the next 90 days, patient will work with PharmD and providers to maintain BP goal <140/90 Current regimen:  Losartan 25 mg 1 tablet daily Diltiazem 120 mg 1 capsule daily Metoprolol succinate 25 mg 1 tablet daily Interventions: Discussed DASH eating plan recommendations: Emphasizes vegetables, fruits, and whole-grains Includes fat-free or low-fat dairy products, fish, poultry, beans, nuts, and vegetable oils Limits foods that are high in saturated fat. These foods include fatty meats, full-fat dairy products, and tropical oils such as coconut, palm kernel, and palm oils. Limits sugar-sweetened beverages and sweets Limiting sodium intake to < 1500 mg/day Patient self care activities - Over the next 90 days, patient will: Check BP weekly, document, and provide at future appointments Ensure daily salt intake < 2300 mg/day  Hyperlipidemia Lab Results  Component Value Date/Time   LDLCALC 57 08/23/2019 01:53 PM  Pharmacist Clinical Goal(s): Over the next 90 days, patient will work with PharmD and  providers to maintain LDL goal < 70 Current regimen:  Pitavastatin 2 mg 1 tablet daily COQ10 & fish oil daily Interventions: Discussed individual goals for cholesterol levels Patient self care activities - Over the next 90 days, patient will: Continue current medications  AFib Pulse Readings from Last 3 Encounters:  03/11/20 80  08/23/19 (!) 59  08/10/19 66  Pharmacist Clinical Goal(s): Over the next 90 days, patient will work with PharmD and providers to maintain HR < 110 beats per minute Current regimen:  Diltiazem 120 mg 1 capsule daily Eliquis 5 mg 1 tablet twice daily Metoprolol succinate 25 mg 1 tablet daily Interventions: Discussed monitoring for signs of bleeding such as unexplained and excessive bleeding from a cut or injury, easy or excessive bruising, blood in urine or stools, and nosebleeds without a known cause Patient self care activities - Over the next 90 days, patient will: Continue current medications  Depression/anxiety Pharmacist Clinical Goal(s) Over the next 90 days, patient will work with PharmD and providers to manage symptoms of depression and anxiety Current regimen:  Bupropion XL 300 mg 1 tablet daily Alprazolam 1 mg 1 tablet three times daily as needed Interventions: Discussed limiting the use of alprazolam and long term risks of taking such as falls, fractures, and memory loss Patient self care activities - Over the next 90 days, patient will: Continue current medications  Medication management Pharmacist Clinical Goal(s): Over the next 90 days, patient will work with PharmD and providers to maintain optimal medication adherence [facility] Interventions Comprehensive medication review performed. Continue current medication  management strategy Patient self care activities - Over the next 90 days, patient will: Take medications as prescribed Report any questions or concerns to PharmD and/or provider(s)  Initial goal documentation          This is a list of the screening recommended for you and due dates:  Health Maintenance  Topic Date Due   Screening for Lung Cancer  05/17/2010   COVID-19 Vaccine (5 - 2023-24 season) 07/29/2022*   Flu Shot  10/22/2022   Medicare Annual Wellness Visit  07/13/2023   DTaP/Tdap/Td vaccine (3 - Td or Tdap) 07/26/2026   Pneumonia Vaccine  Completed   DEXA scan (bone density measurement)  Completed   Hepatitis C Screening: USPSTF Recommendation to screen - Ages 2-79 yo.  Completed   Zoster (Shingles) Vaccine  Completed   HPV Vaccine  Aged Out   Colon Cancer Screening  Discontinued  *Topic was postponed. The date shown is not the original due date.    Advanced directives: Advance directive discussed with you today. Even though you declined this today, please call our office should you change your mind, and we can give you the proper paperwork for you to fill out.   Conditions/risks identified: None  Next appointment: Follow up in one year for your annual wellness visit    Preventive Care 65 Years and Older, Female Preventive care refers to lifestyle choices and visits with your health care provider that can promote health and wellness. What does preventive care include? A yearly physical exam. This is also called an annual well check. Dental exams once or twice a year. Routine eye exams. Ask your health care provider how often you should have your eyes checked. Personal lifestyle choices, including: Daily care of your teeth and gums. Regular physical activity. Eating a healthy diet. Avoiding tobacco and drug use. Limiting alcohol use. Practicing safe sex. Taking low-dose aspirin every day. Taking vitamin and mineral supplements as recommended by your health care provider. What happens during an annual well check? The services and screenings done by your health care provider during your annual well check will depend on your age, overall health, lifestyle risk factors, and  family history of disease. Counseling  Your health care provider may ask you questions about your: Alcohol use. Tobacco use. Drug use. Emotional well-being. Home and relationship well-being. Sexual activity. Eating habits. History of falls. Memory and ability to understand (cognition). Work and work Astronomer. Reproductive health. Screening  You may have the following tests or measurements: Height, weight, and BMI. Blood pressure. Lipid and cholesterol levels. These may be checked every 5 years, or more frequently if you are over 28 years old. Skin check. Lung cancer screening. You may have this screening every year starting at age 61 if you have a 30-pack-year history of smoking and currently smoke or have quit within the past 15 years. Fecal occult blood test (FOBT) of the stool. You may have this test every year starting at age 43. Flexible sigmoidoscopy or colonoscopy. You may have a sigmoidoscopy every 5 years or a colonoscopy every 10 years starting at age 71. Hepatitis C blood test. Hepatitis B blood test. Sexually transmitted disease (STD) testing. Diabetes screening. This is done by checking your blood sugar (glucose) after you have not eaten for a while (fasting). You may have this done every 1-3 years. Bone density scan. This is done to screen for osteoporosis. You may have this done starting at age 27. Mammogram. This may be done every 1-2 years. Talk  to your health care provider about how often you should have regular mammograms. Talk with your health care provider about your test results, treatment options, and if necessary, the need for more tests. Vaccines  Your health care provider may recommend certain vaccines, such as: Influenza vaccine. This is recommended every year. Tetanus, diphtheria, and acellular pertussis (Tdap, Td) vaccine. You may need a Td booster every 10 years. Zoster vaccine. You may need this after age 66. Pneumococcal 13-valent conjugate  (PCV13) vaccine. One dose is recommended after age 76. Pneumococcal polysaccharide (PPSV23) vaccine. One dose is recommended after age 68. Talk to your health care provider about which screenings and vaccines you need and how often you need them. This information is not intended to replace advice given to you by your health care provider. Make sure you discuss any questions you have with your health care provider. Document Released: 04/05/2015 Document Revised: 11/27/2015 Document Reviewed: 01/08/2015 Elsevier Interactive Patient Education  2017 [employer].  Fall Prevention in the Home Falls can cause injuries. They can happen to people of all ages. There are many things you can do to make your home safe and to help prevent falls. What can I do on the outside of my home? Regularly fix the edges of walkways and driveways and fix any cracks. Remove anything that might make you trip as you walk through a door, such as a raised step or threshold. Trim any bushes or trees on the path to your home. Use bright outdoor lighting. Clear any walking paths of anything that might make someone trip, such as rocks or tools. Regularly check to see if handrails are loose or broken. Make sure that both sides of any steps have handrails. Any raised decks and porches should have guardrails on the edges. Have any leaves, snow, or ice cleared regularly. Use sand or salt on walking paths during winter. Clean up any spills in your garage right away. This includes oil or grease spills. What can I do in the bathroom? Use night lights. Install grab bars by the toilet and in the tub and shower. Do not use towel bars as grab bars. Use non-skid mats or decals in the tub or shower. If you need to sit down in the shower, use a plastic, non-slip stool. Keep the floor dry. Clean up any water that spills on the floor as soon as it happens. Remove soap buildup in the tub or shower regularly. Attach bath mats securely with  double-sided non-slip rug tape. Do not have throw rugs and other things on the floor that can make you trip. What can I do in the bedroom? Use night lights. Make sure that you have a light by your bed that is easy to reach. Do not use any sheets or blankets that are too big for your bed. They should not hang down onto the floor. Have a firm chair that has side arms. You can use this for support while you get dressed. Do not have throw rugs and other things on the floor that can make you trip. What can I do in the kitchen? Clean up any spills right away. Avoid walking on wet floors. Keep items that you use a lot in easy-to-reach places. If you need to reach something above you, use a strong step stool that has a grab bar. Keep electrical cords out of the way. Do not use floor polish or wax that makes floors slippery. If you must use wax, use non-skid floor wax. Do  not have throw rugs and other things on the floor that can make you trip. What can I do with my stairs? Do not leave any items on the stairs. Make sure that there are handrails on both sides of the stairs and use them. Fix handrails that are broken or loose. Make sure that handrails are as long as the stairways. Check any carpeting to make sure that it is firmly attached to the stairs. Fix any carpet that is loose or worn. Avoid having throw rugs at the top or bottom of the stairs. If you do have throw rugs, attach them to the floor with carpet tape. Make sure that you have a light switch at the top of the stairs and the bottom of the stairs. If you do not have them, ask someone to add them for you. What else can I do to help prevent falls? Wear shoes that: Do not have high heels. Have rubber bottoms. Are comfortable and fit you well. Are closed at the toe. Do not wear sandals. If you use a stepladder: Make sure that it is fully opened. Do not climb a closed stepladder. Make sure that both sides of the stepladder are locked  into place. Ask someone to hold it for you, if possible. Clearly mark and make sure that you can see: Any grab bars or handrails. First and last steps. Where the edge of each step is. Use tools that help you move around (mobility aids) if they are needed. These include: Canes. Walkers. Scooters. Crutches. Turn on the lights when you go into a dark area. Replace any light bulbs as soon as they burn out. Set up your furniture so you have a clear path. Avoid moving your furniture around. If any of your floors are uneven, fix them. If there are any pets around you, be aware of where they are. Review your medicines with your doctor. Some medicines can make you feel dizzy. This can increase your chance of falling. Ask your doctor what other things that you can do to help prevent falls. This information is not intended to replace advice given to you by your health care provider. Make sure you discuss any questions you have with your health care provider. Document Released: 01/03/2009 Document Revised: 08/15/2015 Document Reviewed: 04/13/2014 Elsevier Interactive Patient Education  2017 [employer].

## 2022-07-13 NOTE — Progress Notes (Signed)
Subjective:   [name] is a 77 y.o. female who presents for Medicare Annual (Subsequent) preventive examination.  Review of Systems    Virtual Visit via Telephone Note  I connected with  Chamika Cunanan on 07/13/22 at  2:30 PM EDT by telephone and verified that I am speaking with the correct person using two identifiers.  Location: Patient: Home Provider: Office Persons participating in the virtual visit: patient/Nurse Health Advisor   I discussed the limitations, risks, security and privacy concerns of performing an evaluation and management service by telephone and the availability of in person appointments. The patient expressed understanding and agreed to proceed.  Interactive audio and video telecommunications were attempted between this nurse and patient, however failed, due to patient having technical difficulties OR patient did not have access to video capability.  We continued and completed visit with audio only.  Some vital signs may be absent or patient reported.   Tillie Rung, LPN  Cardiac Risk Factors include: advanced age (>10men, >32 women)     Objective:    Today's Vitals   07/13/22 1433  Weight: 185 lb (83.9 kg)  Height:  (1.676 m)   Body mass index is 29.86 kg/m.     07/13/2022    2:41 PM 07/09/2021    4:14 PM 03/25/2020    6:01 PM 01/25/2020    1:53 PM 04/06/2018   12:52 PM 03/31/2018   10:28 AM 04/16/2017    3:59 PM  Advanced Directives  Does Patient Have a Medical Advance Directive? No Yes No Yes Yes Yes Yes  Type of Furniture conservator/restorer;Living will  Healthcare Power of [place];Living will Living will;Healthcare Power of [employer] Power of [place];Living will   Does patient want to make changes to medical advance directive?  No - Patient declined  No - Patient declined  No - Patient declined   Copy of Healthcare Power of Attorney in Chart?  No - copy requested  No - copy requested No - copy  requested No - copy requested   Would patient like information on creating a medical advance directive? No - Patient declined  No - Patient declined        Current Medications (verified) Outpatient Encounter Medications as of 07/13/2022  Medication Sig   ALPRAZolam (XANAX) 1 MG tablet TAKE 1 TABLET BY MOUTH THREE TIMES A DAY AS NEEDED   apixaban (ELIQUIS) 5 MG TABS tablet Take 1 tablet (5 mg total) by mouth 2 (two) times daily.   b complex vitamins capsule Take 2 capsules by mouth daily.    buPROPion (WELLBUTRIN XL) 300 MG 24 hr tablet TAKE 1 TABLET BY MOUTH EVERY DAY   chlorhexidine (PERIDEX) 0.12 % solution USE AS DIRECTED. EXPECTORATE - DO NOT SWALLOW   Cholecalciferol (VITAMIN D) 2000 UNITS CAPS Take 2,000 Int'l Units by mouth.   Coenzyme Q10-Fish Oil-Vit E (CO-Q 10 OMEGA-3 FISH OIL PO) Take 100 mg by mouth daily.    diltiazem (CARDIZEM CD) 120 MG 24 hr capsule TAKE 1 CAPSULE BY MOUTH EVERY DAY   FLUAD QUADRIVALENT 0.5 ML injection    furosemide (LASIX) 20 MG tablet TAKE 1 TABLET BY MOUTH EVERY DAY   LIVALO 2 MG TABS TAKE 1 TABLET EVERY DAY   Lysine 500 MG TABS Take 500 mg by mouth daily.   metoprolol succinate (TOPROL-XL) 25 MG 24 hr tablet TAKE 1 TABLET (25 MG TOTAL) BY MOUTH DAILY.   pantoprazole (PROTONIX) 40 MG tablet Take 1  tablet (40 mg total) by mouth daily.   potassium chloride (KLOR-CON) 20 MEQ packet Take 20 mEq by mouth daily.   valACYclovir (VALTREX) 1000 MG tablet TAKE 2 TABLETS AT ONSET OF COLD SORES AND REPEAT 2 IN 12 HOURS.   No facility-administered encounter medications on file as of 07/13/2022.    Allergies (verified) Chlorhexidine, Chlorhexidine base, Clindamycin/lincomycin, Sulfa antibiotics, and Penicillins   History: Past Medical History:  Diagnosis Date   ABDOMINAL PAIN RIGHT UPPER QUADRANT 11/08/2008   ALLERGIC RHINITIS 08/01/2009   Anxiety    Aortic stenosis    mild by echo 10/2021   Cataract    Complication of anesthesia 01/31/2015   ? doesn't  remember anything after being taken to pre surgery   Coronary artery calcification seen on CAT scan 05/30/2009   moderate risk coronary calcium score by chest CT with mid LAD plaque>>patient opted for medical management   DCM (dilated cardiomyopathy)    EF 45-50% by echo 8/23   DDD (degenerative disc disease), lumbar    DEPRESSION 06/13/2008   DYSLIPIDEMIA 02/27/2009   DYSPNEA 02/27/2009   Dysrhythmia    Afib   Frequency of urination    GERD (gastroesophageal reflux disease)    "years ago"   Head injury 2016   did not loose consciousness   History of Bell's palsy    History of DVT of lower extremity 2006-- CHRONIC COUMADIN THERAPY   History of hiatal hernia    History of Lyme disease    Hydronephrosis, left chronic -- secondary to retroperitoneal fibrosis   Hypertension    Lyme disease    NON-HODGKIN'S LYMPHOMA, HX OF dx  2005--  chemoradiation completed 2006--  no recurrence   onocologist- dr odogwu--    OSTEOARTHRITIS, MODERATE 04/07/2010   Permanent atrial fibrillation    On chronic anti-coagulation with warfarin   Pneumonia    x 3 - last time 02/2018   Retroperitoneal fibrosis    Stroke    ? - [place] CT- "you have had 3 strokes"   Past Surgical History:  Procedure Laterality Date   BREAST MASS EXCISION     BENIGN-- RIGHT BREAST   CARDIOVASCULAR STRESS TEST  04-04-2009-- PER PT ASYMPTOMATIC-- MEDICAL MANAGEMENT   STUDY WAS EQUIVOCAL/ EF 49%/ GLOBAL HYPOKINESIS/ APPEARS TO BE A MILD ANTERIOR PERFUSION DEFECT COULD REPRESENT ISCHEMIA BUT DUE TO  ACTIVITY WORSE IN STRESS THAN AT REST POSS. THE DEFECT WAS ARTIFACTUAL   CARDIOVERSION N/A 07/11/2015   Procedure: CARDIOVERSION;  Surgeon: Laurey Morale, MD;  Location: [facility] ENDOSCOPY;  Service: Cardiovascular;  Laterality: N/A;   CT ANGIOGRAM  FEB 2011   CALCIUM SCORE 161/ POSS. MODERATE MID LAD STENOSIS   CYSTOSCOPY W/ RETROGRADES  04/28/2011   Procedure: CYSTOSCOPY WITH RETROGRADE PYELOGRAM;  Surgeon: Antony Haste, MD;   Location: [facility];  Service: Urology;  Laterality: Left;   CYSTOSCOPY W/ URETERAL STENT REMOVAL  04/28/2011   Procedure: CYSTOSCOPY WITH STENT REMOVAL;  Surgeon: Antony Haste, MD;  Location: [facility];  Service: Urology;;   EXPLORATORY LAPAROTOMY  2005   LYMPHOMA   EYE SURGERY Bilateral    cataract    GAS INSERTION Right 03/31/2018   Procedure: RIGHT EYE INSERTION OF GAS C3F8;  Surgeon: Rennis Chris, MD;  Location: [facility] OR;  Service: Ophthalmology;  Laterality: Right;   KNEE ARTHROSCOPY  2005   RIGHT   LASER PHOTO ABLATION Right 03/31/2018   Procedure: RIGHT EYE LASER PHOTO ABLATION;  Surgeon: Rennis Chris, MD;  Location: [facility] OR;  Service: Ophthalmology;  Laterality: Right;   MULTIPLE CYSTO/ LEFT URETERAL STENT EXCHANGES  LAST ONE 10-28-10   RETROPERITONEAL BX  2007   REVERSE SHOULDER ARTHROPLASTY Right 01/31/2015   Procedure: RIGHT REVERSE SHOULDER ARTHROPLASTY;  Surgeon: Francena Hanly, MD;  Location: [facility] OR;  Service: Orthopedics;  Laterality: Right;   TONSILLECTOMY  CHILD   TOTAL KNEE ARTHROPLASTY  OCT 2010   RIGHT   TOTAL KNEE ARTHROPLASTY  JAN 2010   LEFT   TRANSTHORACIC ECHOCARDIOGRAM  05-03-2009   MILD SYSTOLIC DYSFUNCTION/ EF 45-50%/ MODERATE DIASTOLIC DYSFUCTION/ MILD MR/ MILD BIATRIAL ENLARGEMENT   VITRECTOMY 25 GAUGE WITH SCLERAL BUCKLE Right 03/31/2018   Procedure: RIGHT VITRECTOMY 25 GAUGE WITH SCLERAL BUCKLE;  Surgeon: Rennis Chris, MD;  Location[facility];  Service: Ophthalmology;  Laterality: Right;   Family History  Problem Relation Age of Onset   Arthritis Mother    Heart disease Mother    Heart attack Mother    Diabetes Sister    Hypertension Sister    Stroke Sister    Heart attack Sister    Liver disease Son        r/t Tylenol use and alcoholism   Colon cancer Neg Hx    Stomach cancer Neg Hx    Pancreatic cancer Neg Hx    Esophageal cancer Neg Hx    Rectal cancer Neg Hx    Social History   Socioeconomic History    Marital status: Married    Spouse name: Not on file   Number of children: Not on file   Years of education: Not on file   Highest education level: Not on file  Occupational History   Not on file  Tobacco Use   Smoking status: Former    Packs/day: 2.00    Years: 52.00    Additional pack years: 0.00    Total pack years: 104.00    Types: Cigarettes    Quit date: 03/24/2008    Years since quitting: 14.3   Smokeless tobacco: Never  Vaping Use   Vaping Use: Never used  Substance and Sexual Activity   Alcohol use: Not Currently    Comment: RARE   Drug use: No   Sexual activity: Not Currently  Other Topics Concern   Not on file  Social History Narrative   Not on file   Social Determinants of Health   Financial Resource Strain: Low Risk  (07/13/2022)   Overall Financial Resource Strain (CARDIA)    Difficulty of Paying Living Expenses: Not hard at all  Food Insecurity: No Food Insecurity (07/13/2022)   Hunger Vital Sign    Worried About Running Out of Food in the Last Year: Never true    Ran Out of Food in the Last Year: Never true  Transportation Needs: No Transportation Needs (07/13/2022)   PRAPARE - Administrator, Civil Service (Medical): No    Lack of Transportation (Non-Medical): No  Physical Activity: Inactive (07/13/2022)   Exercise Vital Sign    Days of Exercise per Week: 0 days    Minutes of Exercise per Session: 0 min  Stress: No Stress Concern Present (07/13/2022)   [employer] of Occupational Health - Occupational Stress Questionnaire    Feeling of Stress : Not at all  Social Connections: Socially Isolated (07/13/2022)   Social Connection and Isolation Panel [NHANES]    Frequency of Communication with Friends and Family: More than three times a week    Frequency of Social Gatherings with  Friends and Family: More than three times a week    Attends Religious Services: Never    Database administrator or Organizations: No    Attends Banker  Meetings: Never    Marital Status: Widowed    Tobacco Counseling Counseling given: Not Answered   Clinical Intake:  Pre-visit preparation completed: No  Pain : No/denies pain     BMI - recorded: 29.86 Nutritional Status: BMI 25 -29 Overweight Nutritional Risks: None Diabetes: No  How often do you need to have someone help you when you read instructions, pamphlets, or other written materials from your doctor or pharmacy?: 1 - Never  Diabetic?  No  Interpreter Needed?: No  Information entered by :: Theresa Mulligan LPN   Activities of Daily Living    07/13/2022    2:39 PM  In your present state of health, do you have any difficulty performing the following activities:  Hearing? 0  Vision? 0  Difficulty concentrating or making decisions? 0  Walking or climbing stairs? 0  Dressing or bathing? 0  Doing errands, shopping? 0  Preparing Food and eating ? N  Using the Toilet? N  In the past six months, have you accidently leaked urine? N  Do you have problems with loss of bowel control? N  Comment Wears panty liners PRN  Managing your Medications? N  Managing your Finances? N  Housekeeping or managing your Housekeeping? N    Patient Care Team: Kristian Covey, MD as PCP - General Quintella Reichert, MD as PCP - Cardiology (Cardiology) Verner Chol, [facility] (Inactive) as Pharmacist (Pharmacist)  Indicate any recent Medical Services you may have received from other than [facility] providers in the past year (date may be approximate).     Assessment:   This is a routine wellness examination for [place].  Hearing/Vision screen Hearing Screening - Comments:: Denies hearing difficulties   Vision Screening - Comments:: Wears rx glasses - up to date with routine eye exams with  Dr Cathey Endow  Dietary issues and exercise activities discussed: Exercise limited by: None identified   Goals Addressed               This Visit's Progress     No current goals (pt-stated)          Depression Screen    07/13/2022    2:38 PM 10/20/2021    3:26 PM 07/09/2021    4:08 PM 03/25/2020    6:36 PM 01/25/2020    2:00 PM 04/16/2017    4:00 PM 05/31/2015   11:23 AM  PHQ 2/9 Scores  PHQ - 2 Score 0 6 2 4 5  0 0  PHQ- 9 Score  11 2 6 7       Fall Risk    07/13/2022    2:40 PM 01/21/2022    3:34 PM 10/20/2021    3:26 PM 07/09/2021    4:13 PM 01/25/2020    1:56 PM  Fall Risk   Falls in the past year? 0 1 1 0 0  Number falls in past yr: 0 1 1 0 0  Injury with Fall? 0 1 0 0 0  Risk for fall due to : No Fall Risks No Fall Risks No Fall Risks No Fall Risks Impaired balance/gait  Follow up Falls prevention discussed Falls evaluation completed Falls evaluation completed  Falls evaluation completed;Falls prevention discussed    FALL RISK PREVENTION PERTAINING TO THE HOME:  Any stairs in or around the home? Yes  If so, are there any without handrails? No  Home free of loose throw rugs in walkways, pet beds, electrical cords, etc? Yes  Adequate lighting in your home to reduce risk of falls? Yes   ASSISTIVE DEVICES UTILIZED TO PREVENT FALLS:  Life alert? No  Use of a cane, walker or w/c? Yes  Grab bars in the bathroom? No  Shower chair or bench in shower? Yes Elevated toilet seat or a handicapped toilet? Yes  TIMED UP AND GO:  Was the test performed? No . Audio Visit   Cognitive Function:        07/13/2022    2:41 PM 07/09/2021    4:14 PM  6CIT Screen  What Year? 0 points 0 points  What month? 0 points 0 points  What time? 0 points 0 points  Count back from 20 0 points 0 points  Months in reverse 0 points 0 points  Repeat phrase 0 points 0 points  Total Score 0 points 0 points    Immunizations Immunization History  Administered Date(s) Administered   COVID-19, mRNA, vaccine(Comirnaty)12 years and older 04/13/2022   Fluad Quad(high Dose 65+) 03/21/2019, 01/31/2020   Influenza Split 01/08/2011, 11/30/2011, 04/23/2014   Influenza Whole 04/07/2010    Influenza, High Dose Seasonal PF 01/15/2017, 12/24/2017   Influenza,inj,Quad PF,6+ Mos 11/30/2012, 04/23/2014   Influenza-Unspecified 04/04/2015   PFIZER(Purple Top)SARS-COV-2 Vaccination 05/14/2019, 06/07/2019, 01/09/2020   Pneumococcal Conjugate-13 05/31/2015   Pneumococcal Polysaccharide-23 01/08/2011   Td 07/25/2016   Tdap 05/22/2012   Zoster Recombinat (Shingrix) 07/16/2016, 09/29/2016    TDAP status: Up to date  Flu Vaccine status: Up to date  Pneumococcal vaccine status: Up to date  Covid-19 vaccine status: Completed vaccines  Qualifies for Shingles Vaccine? Yes   Zostavax completed Yes   Shingrix Completed?: Yes  Screening Tests Health Maintenance  Topic Date Due   Lung Cancer Screening  05/17/2010   COVID-19 Vaccine (5 - 2023-24 season) 07/29/2022 (Originally 06/08/2022)   INFLUENZA VACCINE  10/22/2022   Medicare Annual Wellness (AWV)  07/13/2023   DTaP/Tdap/Td (3 - Td or Tdap) 07/26/2026   Pneumonia Vaccine 65+ Years old  Completed   DEXA SCAN  Completed   Hepatitis C Screening  Completed   Zoster Vaccines- Shingrix  Completed   HPV VACCINES  Aged Out   COLONOSCOPY (Pts 45-75yrs Insurance coverage will need to be confirmed)  Discontinued    Health Maintenance  Health Maintenance Due  Topic Date Due   Lung Cancer Screening  05/17/2010    Colorectal cancer screening: No longer required.   Mammogram status: No longer required due to Age.  Bone Density status: Completed 04/13/15. Results reflect: Bone density results: OSTEOPOROSIS. Repeat every   years.  Lung Cancer Screening: (Low Dose CT Chest recommended if Age 4-80 years, 30 pack-year currently smoking OR have quit w/in 15years.) does qualify.   Lung Cancer Screening Referral: 07/13/22  Additional Screening:  Hepatitis C Screening: does qualify; Completed 05/31/15  Vision Screening: Recommended annual ophthalmology exams for early detection of glaucoma and other disorders of the eye. Is the patient  up to date with their annual eye exam?  Yes  Who is the provider or what is the name of the office in which the patient attends annual eye exams? Dr Cathey Endow If pt is not established with a provider, would they like to be referred to a provider to establish care? No .   Dental Screening: Recommended annual dental exams for proper oral hygiene  Community Resource Referral /  Chronic Care Management:  CRR required this visit?  No   CCM required this visit?  No      Plan:     I have personally reviewed and noted the following in the patient's chart:   Medical and social history Use of alcohol, tobacco or illicit drugs  Current medications and supplements including opioid prescriptions. Patient is not currently taking opioid prescriptions. Functional ability and status Nutritional status Physical activity Advanced directives List of other physicians Hospitalizations, surgeries, and ER visits in previous 12 months Vitals Screenings to include cognitive, depression, and falls Referrals and appointments  In addition, I have reviewed and discussed with patient certain preventive protocols, quality metrics, and best practice recommendations. A written personalized care plan for preventive services as well as general preventive health recommendations were provided to patient.     Tillie Rung, LPN   06/29/8117   Nurse Notes: None

## 2022-07-16 ENCOUNTER — Other Ambulatory Visit: Payer: Self-pay | Admitting: Cardiology

## 2022-07-16 DIAGNOSIS — I4819 Other persistent atrial fibrillation: Secondary | ICD-10-CM

## 2022-07-23 ENCOUNTER — Ambulatory Visit (INDEPENDENT_AMBULATORY_CARE_PROVIDER_SITE_OTHER): Payer: Medicare Other | Admitting: Family Medicine

## 2022-07-23 ENCOUNTER — Encounter: Payer: Self-pay | Admitting: Family Medicine

## 2022-07-23 VITALS — BP 112/70 | HR 71 | Temp 98.5°F | Wt 175.2 lb

## 2022-07-23 DIAGNOSIS — W57XXXA Bitten or stung by nonvenomous insect and other nonvenomous arthropods, initial encounter: Secondary | ICD-10-CM

## 2022-07-23 DIAGNOSIS — L089 Local infection of the skin and subcutaneous tissue, unspecified: Secondary | ICD-10-CM | POA: Diagnosis not present

## 2022-07-23 DIAGNOSIS — L03116 Cellulitis of left lower limb: Secondary | ICD-10-CM

## 2022-07-23 DIAGNOSIS — S90512A Abrasion, left ankle, initial encounter: Secondary | ICD-10-CM | POA: Diagnosis not present

## 2022-07-23 DIAGNOSIS — S40862A Insect bite (nonvenomous) of left upper arm, initial encounter: Secondary | ICD-10-CM | POA: Diagnosis not present

## 2022-07-23 MED ORDER — DOXYCYCLINE HYCLATE 100 MG PO TABS
100.0000 mg | ORAL_TABLET | Freq: Two times a day (BID) | ORAL | 0 refills | Status: DC
Start: 2022-07-23 — End: 2022-07-25

## 2022-07-23 NOTE — Progress Notes (Signed)
Established Patient Office Visit   Subjective  Patient ID: [name], female    DOB: Nov 12, 1945  Age: 77 y.o. MRN: [id number]  Chief Complaint  Patient presents with   Ankle Injury    Left ankle, has scab, some discoloration. Indentation does not stay, gets big. Lightly banged it about 3 days ago.    Patient is a 77 year old female with pmh sig for history of DVT, dilated cardiomyopathy, A-fib, mitral stenosis, on chronic anticoagulation, diastolic CHF, HTN, seasonal allergies, OA, CKD 3, HLD, history of depression, obesity, anxiety who is followed by Dr. Caryl Never and seen for acute concern.  Patient endorses bumping into something 3 days ago causing a cut on left ankle.  Area has a scab in place and is not painful.  Patient's family members thought she should get it checked as it may be infected.  Patient also notes seeing a tick in left AC 3 days ago.  Patient attempted to remove the tick with tweezers but concerned the head may have remained.  Patient mentions a prior history of Lyme disease?Marland Kitchen  Ankle Injury       ROS Negative unless stated above    Objective:     BP 112/70 (BP Location: Left Arm, Patient Position: Sitting, Cuff Size: Normal)   Pulse 71   Temp 98.5 F (36.9 C) (Oral)   Wt 175 lb 3.2 oz (79.5 kg)   SpO2 96%   BMI 28.28 kg/m    Physical Exam Constitutional:      General: She is not in acute distress.    Appearance: Normal appearance.  HENT:     Head: Normocephalic and atraumatic.     Nose: Nose normal.     Mouth/Throat:     Mouth: Mucous membranes are moist.  Cardiovascular:     Rate and Rhythm: Normal rate and regular rhythm.     Heart sounds: Normal heart sounds. No murmur heard.    No gallop.  Pulmonary:     Effort: Pulmonary effort is normal. No respiratory distress.     Breath sounds: Normal breath sounds. No wheezing, rhonchi or rales.  Skin:    General: Skin is warm and dry.          Comments: Left lateral ankle 2 cm area  of eschar, no drainage, surrounded by faint erythema.  Skin warmer surrounding wound.  Small raised area in left AC.  No erythema, or increased warmth.  Neurological:     Mental Status: She is alert and oriented to person, place, and time.      No results found for any visits on 07/23/22.    Assessment & Plan:  Cellulitis of left lower extremity -     Doxycycline Hyclate; Take 1 tablet (100 mg total) by mouth 2 (two) times daily for 7 days.  Dispense: 14 tablet; Refill: 0  Infected abrasion -     Doxycycline Hyclate; Take 1 tablet (100 mg total) by mouth 2 (two) times daily for 7 days.  Dispense: 14 tablet; Refill: 0  Tick bite of left upper arm, initial encounter -     Doxycycline Hyclate; Take 1 tablet (100 mg total) by mouth 2 (two) times daily for 7 days.  Dispense: 14 tablet; Refill: 0  Start abx for tick bite and wound.  Rx for doxycycline sent.  Allergies to penicillins, sulfa, clindamycin.  Advised to keep wound on left lateral ankle clean and dry.  Monitor for s/s of infection.   Given strict precautions.  Return  if symptoms worsen or fail to improve.   Deeann Saint, MD

## 2022-07-25 ENCOUNTER — Encounter: Payer: Self-pay | Admitting: *Deleted

## 2022-07-25 ENCOUNTER — Other Ambulatory Visit: Payer: Self-pay

## 2022-07-25 ENCOUNTER — Ambulatory Visit
Admission: EM | Admit: 2022-07-25 | Discharge: 2022-07-25 | Disposition: A | Discharge status: Home / Self Care | Payer: Medicare Other | Attending: Family Medicine | Admitting: Family Medicine

## 2022-07-25 DIAGNOSIS — T7840XA Allergy, unspecified, initial encounter: Secondary | ICD-10-CM | POA: Diagnosis not present

## 2022-07-25 DIAGNOSIS — R220 Localized swelling, mass and lump, head: Secondary | ICD-10-CM

## 2022-07-25 MED ORDER — METHYLPREDNISOLONE ACETATE 80 MG/ML IJ SUSP: 80.0000 mg | Freq: Once | INTRAMUSCULAR | Status: DC

## 2022-07-25 MED ORDER — PREDNISONE 10 MG (21) PO TBPK: ORAL_TABLET | Freq: Every day | ORAL | 0 refills | Status: DC

## 2022-07-25 NOTE — Discharge Instructions (Addendum)
Patient has discontinued Doxycycline.  Advised patient to take medication as directed with food to completion.  Encouraged increase daily water intake while taking this medication.  Advised if symptoms worsen and/or unresolved please follow-up with PCP or here for further evaluation.

## 2022-07-25 NOTE — ED Triage Notes (Signed)
Pt reports she took her first dose of Doxycycline at noon on Friday for the wound on her Lt lower leg.Pt took a nap and woke up with swelling to upper and lower lip. Pt also has swelling to Lt side of face. Pt is reporting skin is itching also.

## 2022-07-25 NOTE — ED Provider Notes (Signed)
[name]    CSN: [id number] Arrival date & time: 07/25/22  1413      History   Chief Complaint Chief Complaint  Patient presents with   Allergic Reaction    HPI [name] is a 77 y.o. female.   HPI 77 year old female reports taking previously prescribed  Doxycycline for wound of left lower leg.  Reports after nap woke up with upper and lower lip swelling.  Patient also reports swelling to left side of the face as well Reports pruritic rash occurred nearly immediately after taking 1 Doxycycline tablet .  PMH significant for TIA, non-rheumatic mitral valve insufficiency, DCM, personal history of non-Hodgkin's lymphoma.  Patient is currently on apixaban and denies any unusual bleeding.  Past Medical History:  Diagnosis Date   ABDOMINAL PAIN RIGHT UPPER QUADRANT 11/08/2008   ALLERGIC RHINITIS 08/01/2009   Anxiety    Aortic stenosis    mild by echo 10/2021   Cataract    Complication of anesthesia 01/31/2015   ? doesn't remember anything after being taken to pre surgery   Coronary artery calcification seen on CAT scan 05/30/2009   moderate risk coronary calcium score by chest CT with mid LAD plaque>>patient opted for medical management   DCM (dilated cardiomyopathy) (HCC)    EF 45-50% by echo 8/23   DDD (degenerative disc disease), lumbar    DEPRESSION 06/13/2008   DYSLIPIDEMIA 02/27/2009   DYSPNEA 02/27/2009   Dysrhythmia    Afib   Frequency of urination    GERD (gastroesophageal reflux disease)    "years ago"   Head injury 2016   did not loose consciousness   History of Bell's palsy    History of DVT of lower extremity 2006-- CHRONIC COUMADIN THERAPY   History of hiatal hernia    History of Lyme disease    Hydronephrosis, 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 - [place]- "you have had 3 strokes"    Patient Active Problem List   Diagnosis Date Noted   CKD (chronic kidney disease) stage 3, GFR 30-59 ml/min (HCC) 10/21/2021   Aortic stenosis 09/08/2020   Loss of weight 06/05/2020   Pain in left foot 07/03/2019   Chronic low back pain 01/16/2017   Encounter for therapeutic drug monitoring 12/11/2016   Acute encephalopathy 02/03/2015   Hematoma of right parietal scalp 02/03/2015   Anticoagulated on Coumadin 02/03/2015   Anemia 02/03/2015   Status post reverse total shoulder replacement 01/31/2015   Chronic diastolic CHF (congestive heart failure) (HCC) 06/10/2014   Anxiety state 04/23/2014   Permanent atrial fibrillation (HCC) 11/13/2013   Mitral stenosis 11/13/2013   Obesity (BMI 30-39.9) 11/30/2012   Essential hypertension 11/03/2012   DCM (dilated cardiomyopathy) (HCC) 09/16/2011   DVT of lower extremity (deep venous thrombosis) (HCC) 01/22/2011   Osteoarthritis 04/07/2010   ALLERGIC RHINITIS 08/01/2009   Coronary artery calcification seen on CAT scan 05/30/2009   CARDIOVASCULAR FUNCTION STUDY, ABNORMAL 05/01/2009   HLD (hyperlipidemia) 02/27/2009   DYSPNEA 02/27/2009   ABDOMINAL PAIN RIGHT UPPER QUADRANT 11/08/2008   NHL (non-Hodgkin's  lymphoma) (HCC) 11/08/2008   Depression, major, in partial remission (HCC) 06/13/2008   FATIGUE 05/30/2008    Past Surgical History:  Procedure Laterality Date   BREAST MASS EXCISION     BENIGN-- RIGHT BREAST   CARDIOVASCULAR STRESS TEST  04-04-2009-- PER PT ASYMPTOMATIC-- MEDICAL MANAGEMENT   STUDY WAS EQUIVOCAL/ EF 49%/ GLOBAL HYPOKINESIS/ APPEARS TO BE A MILD ANTERIOR PERFUSION DEFECT COULD REPRESENT ISCHEMIA BUT DUE TO  ACTIVITY WORSE IN STRESS THAN AT REST POSS. THE DEFECT WAS ARTIFACTUAL   CARDIOVERSION  N/A 07/11/2015   Procedure: CARDIOVERSION;  Surgeon: Laurey Morale, MD;  Location: [facility] ENDOSCOPY;  Service: Cardiovascular;  Laterality: N/A;   CT ANGIOGRAM  FEB 2011   CALCIUM SCORE 161/ POSS. MODERATE MID LAD STENOSIS   CYSTOSCOPY W/ RETROGRADES  04/28/2011   Procedure: CYSTOSCOPY WITH RETROGRADE PYELOGRAM;  Surgeon: Antony Haste, MD;  Location: [facility];  Service: Urology;  Laterality: Left;   CYSTOSCOPY W/ URETERAL STENT REMOVAL  04/28/2011   Procedure: CYSTOSCOPY WITH STENT REMOVAL;  Surgeon: Antony Haste, MD;  Location: [facility];  Service: Urology;;   EXPLORATORY LAPAROTOMY  2005   LYMPHOMA   EYE SURGERY Bilateral    cataract    GAS INSERTION Right 03/31/2018   Procedure: RIGHT EYE INSERTION OF GAS C3F8;  Surgeon: Rennis Chris, MD;  Location: [facility] OR;  Service: Ophthalmology;  Laterality: Right;   KNEE ARTHROSCOPY  2005   RIGHT   LASER PHOTO ABLATION Right 03/31/2018   Procedure: RIGHT EYE LASER PHOTO ABLATION;  Surgeon: Rennis Chris, MD;  Location: [facility] OR;  Service: Ophthalmology;  Laterality: Right;   MULTIPLE CYSTO/ LEFT URETERAL STENT EXCHANGES  LAST ONE 10-28-10   RETROPERITONEAL BX  2007   REVERSE SHOULDER ARTHROPLASTY Right 01/31/2015   Procedure: RIGHT REVERSE SHOULDER ARTHROPLASTY;  Surgeon: Francena Hanly, MD;  Location: [facility] OR;  Service: Orthopedics;  Laterality: Right;   TONSILLECTOMY  CHILD   TOTAL KNEE ARTHROPLASTY  OCT 2010   RIGHT   TOTAL KNEE ARTHROPLASTY  JAN 2010   LEFT   TRANSTHORACIC ECHOCARDIOGRAM  05-03-2009   MILD SYSTOLIC DYSFUNCTION/ EF 45-50%/ MODERATE DIASTOLIC DYSFUCTION/ MILD MR/ MILD BIATRIAL ENLARGEMENT   VITRECTOMY 25 GAUGE WITH SCLERAL BUCKLE Right 03/31/2018   Procedure: RIGHT VITRECTOMY 25 GAUGE WITH SCLERAL BUCKLE;  Surgeon: Rennis Chris, MD;  Location: [facility] OR;  Service: Ophthalmology;  Laterality: Right;    OB History   No obstetric history on file.      Home Medications    Prior to  Admission medications   Medication Sig Start Date End Date Taking? Authorizing Provider  predniSONE (STERAPRED UNI-PAK 21 TAB) 10 MG (21) TBPK tablet Take by mouth daily. Take 6 tabs by mouth daily  for 2 days, then 5 tabs for 2 days, then 4 tabs for 2 days, then 3 tabs for 2 days, 2 tabs for 2 days, then 1 tab by mouth daily for 2 days 07/25/22  Yes Trevor Iha, FNP  ALPRAZolam Prudy Feeler) 1 MG tablet TAKE 1 TABLET BY MOUTH THREE TIMES A DAY AS NEEDED 07/06/22   Nelwyn Salisbury, MD  apixaban (ELIQUIS) 5 MG TABS tablet Take 1 tablet (5 mg total) by mouth 2 (two) times daily. 05/05/22   Quintella Reichert, MD  b complex vitamins capsule Take 2 capsules by mouth daily.     [provider]  buPROPion (WELLBUTRIN XL) 300 MG 24 hr tablet TAKE 1 TABLET BY MOUTH EVERY DAY 04/17/22   Evelena Peat  W, MD  chlorhexidine (PERIDEX) 0.12 % solution USE AS DIRECTED. EXPECTORATE - DO NOT SWALLOW 04/06/22   Burchette, Elberta Fortis, MD  Cholecalciferol (VITAMIN D) 2000 UNITS CAPS Take 2,000 Int'l Units by mouth.    [provider]  Coenzyme Q10-Fish Oil-Vit E (CO-Q 10 OMEGA-3 FISH OIL PO) Take 100 mg by mouth daily.     [provider]  diltiazem (CARDIZEM CD) 120 MG 24 hr capsule TAKE 1 CAPSULE BY MOUTH EVERY DAY 12/22/21   Quintella Reichert, MD  FLUAD QUADRIVALENT 0.5 ML injection  01/31/20   [provider]  furosemide (LASIX) 20 MG tablet TAKE 1 TABLET BY MOUTH EVERY DAY 10/20/21   Quintella Reichert, MD  LIVALO 2 MG TABS TAKE 1 TABLET EVERY DAY 10/22/21   Quintella Reichert, MD  Lysine 500 MG TABS Take 500 mg by mouth daily.    [provider]  metoprolol succinate (TOPROL-XL) 25 MG 24 hr tablet TAKE 1 TABLET (25 MG TOTAL) BY MOUTH DAILY. 07/16/22   Quintella Reichert, MD  pantoprazole (PROTONIX) 40 MG tablet Take 1 tablet (40 mg total) by mouth daily. 10/15/21   Burchette, Elberta Fortis, MD  potassium chloride (KLOR-CON) 20 MEQ packet Take 20 mEq by mouth daily. 10/16/21   Quintella Reichert, MD   valACYclovir (VALTREX) 1000 MG tablet TAKE 2 TABLETS AT ONSET OF COLD SORES AND REPEAT 2 IN 12 HOURS. 04/03/22   Burchette, Elberta Fortis, MD    Family History Family History  Problem Relation Age of Onset   Arthritis Mother    Heart disease Mother    Heart attack Mother    Diabetes Sister    Hypertension Sister    Stroke Sister    Heart attack Sister    Liver disease Son        r/t Tylenol use and alcoholism   Colon cancer Neg Hx    Stomach cancer Neg Hx    Pancreatic cancer Neg Hx    Esophageal cancer Neg Hx    Rectal cancer Neg Hx     Social History Social History   Tobacco Use   Smoking status: Former    Packs/day: 2.00    Years: 52.00    Additional pack years: 0.00    Total pack years: 104.00    Types: Cigarettes    Quit date: 03/24/2008    Years since quitting: 14.3   Smokeless tobacco: Never  Vaping Use   Vaping Use: Never used  Substance Use Topics   Alcohol use: Not Currently    Comment: RARE   Drug use: No     Allergies   Chlorhexidine, Chlorhexidine base, Clindamycin/lincomycin, Sulfa antibiotics, and Penicillins   Review of Systems Review of Systems  HENT:  Positive for facial swelling.   Skin:  Positive for rash.  All other systems reviewed and are negative.    Physical Exam Triage Vital Signs ED Triage Vitals  Enc Vitals Group     BP      Pulse      Resp      Temp      Temp src      SpO2      Weight      Height      Head Circumference      Peak Flow      Pain Score      Pain Loc      Pain Edu?      Excl. in GC?  No data found.  Updated Vital Signs BP 100/70   Pulse 80   Temp 98 F (36.7 C)   Resp 18   SpO2 97%      Physical Exam Vitals and nursing note reviewed.  Constitutional:      Appearance: Normal appearance. She is obese.  HENT:     Head: Normocephalic and atraumatic.     Comments: Present facial and upper/lower lip swelling-please see image below    Right Ear: Tympanic membrane, ear canal and external ear  normal.     Left Ear: Tympanic membrane, ear canal and external ear normal.     Nose: Nose normal.     Mouth/Throat:     Mouth: Mucous membranes are moist.     Pharynx: Oropharynx is clear.  Eyes:     Extraocular Movements: Extraocular movements intact.     Conjunctiva/sclera: Conjunctivae normal.     Pupils: Pupils are equal, round, and reactive to light.  Cardiovascular:     Rate and Rhythm: Normal rate and regular rhythm.     Pulses: Normal pulses.     Heart sounds: Normal heart sounds.  Pulmonary:     Effort: Pulmonary effort is normal.     Breath sounds: Normal breath sounds. No wheezing or rhonchi.  Musculoskeletal:        General: Normal range of motion.     Cervical back: Normal range of motion and neck supple.  Skin:    General: Skin is warm and dry.     Comments: Face, anterior chest, bilateral lower arms (volar aspects): Erythematous maculopapular eruption with soft tissue swelling noted-please see images below  Neurological:     General: No focal deficit present.     Mental Status: She is alert and oriented to person, place, and time.           UC Treatments / Results  Labs (all labs ordered are listed, but only abnormal results are displayed) Labs Reviewed - No data to display  EKG   Radiology No results found.  Procedures Procedures (including critical care time)  Medications Ordered in UC Medications  methylPREDNISolone acetate (DEPO-MEDROL) injection 80 mg (has no administration in time range)    Initial Impression / Assessment and Plan / UC Course  I have reviewed the triage vital signs and the nursing notes.  Pertinent labs & imaging results that were available during my care of the patient were reviewed by me and considered in my medical decision making (see chart for details).     MDM: 1.  Allergic reaction to drug, initial encounter-patient advised to discontinue doxycycline.  Advised patient to start Sterapred Unipak prescribed  (tapering patient from 60 mg to 10 mg over 10 days) tomorrow morning, Sunday, 07/26/2022. 2.  Facial swelling-IM Depo-Medrol 80 mg given once in clinic prior to discharge. Final Clinical Impressions(s) / UC Diagnoses   Final diagnoses:  Allergic reaction to drug, initial encounter  Facial swelling     Discharge Instructions      Patient has discontinued Doxycycline.  Advised patient to take medication as directed with food to completion.  Encouraged increase daily water intake while taking this medication.  Advised if symptoms worsen and/or unresolved please follow-up with PCP or here for further evaluation.     ED Prescriptions     Medication Sig Dispense Auth. Provider   predniSONE (STERAPRED UNI-PAK 21 TAB) 10 MG (21) TBPK tablet Take by mouth daily. Take 6 tabs by mouth daily  for 2 days, then  5 tabs for 2 days, then 4 tabs for 2 days, then 3 tabs for 2 days, 2 tabs for 2 days, then 1 tab by mouth daily for 2 days 42 tablet Trevor Iha, FNP      PDMP not reviewed this encounter.   Trevor Iha, FNP 07/25/22 1650

## 2022-07-29 ENCOUNTER — Other Ambulatory Visit: Payer: Self-pay | Admitting: Cardiology

## 2022-08-03 ENCOUNTER — Other Ambulatory Visit: Payer: Self-pay | Admitting: Family Medicine

## 2022-08-03 NOTE — Telephone Encounter (Signed)
Pt LOV was on 07/23/22 Last refill was done on 07/06/22 Please advise

## 2022-08-07 ENCOUNTER — Telehealth: Payer: Self-pay | Admitting: Cardiology

## 2022-08-07 NOTE — Telephone Encounter (Signed)
Pt c/o swelling: STAT is pt has developed SOB within 24 hours  If swelling, where is the swelling located? Left ankle  How much weight have you gained and in what time span? NA  Have you gained 3 pounds in a day or 5 pounds in a week? No  Do you have a log of your daily weights (if so, list)? No  Are you currently taking a fluid pill? Yes  Are you currently SOB? No  Have you traveled recently? No   Pt would like to know if she needs to increase Lasix due to swelling. Please advise

## 2022-08-07 NOTE — Telephone Encounter (Signed)
Pt called stating she is experiencing swelling in her left ankle and will like to increase her lasix . She denies any other symptoms. Pt was seen in the ED on 5/4  for allergic reaction to doxycycline, during the visit it was documented there was a spot on her left ankle. [name] stated the wound is getting better, she will like to be seen by MD. Pt has not f/u with PCP because she did not think it was necessary.  Advised pt to f/u with PCP, explained ED precautions, and pt voiced understanding. Will forward to MD and nurse for advise. Pt is scheduled with APP on 5/21.

## 2022-08-12 ENCOUNTER — Ambulatory Visit: Payer: Medicare Other | Attending: Physician Assistant | Admitting: Physician Assistant

## 2022-08-12 ENCOUNTER — Encounter: Payer: Self-pay | Admitting: Physician Assistant

## 2022-08-12 VITALS — BP 102/74 | HR 87 | Ht 66.0 in | Wt 176.0 lb

## 2022-08-12 DIAGNOSIS — I420 Dilated cardiomyopathy: Secondary | ICD-10-CM

## 2022-08-12 DIAGNOSIS — I350 Nonrheumatic aortic (valve) stenosis: Secondary | ICD-10-CM | POA: Diagnosis not present

## 2022-08-12 DIAGNOSIS — I4821 Permanent atrial fibrillation: Secondary | ICD-10-CM | POA: Diagnosis not present

## 2022-08-12 DIAGNOSIS — E7800 Pure hypercholesterolemia, unspecified: Secondary | ICD-10-CM | POA: Diagnosis not present

## 2022-08-12 DIAGNOSIS — I10 Essential (primary) hypertension: Secondary | ICD-10-CM

## 2022-08-12 DIAGNOSIS — I4819 Other persistent atrial fibrillation: Secondary | ICD-10-CM

## 2022-08-12 DIAGNOSIS — I5032 Chronic diastolic (congestive) heart failure: Secondary | ICD-10-CM | POA: Diagnosis not present

## 2022-08-12 DIAGNOSIS — I2510 Atherosclerotic heart disease of native coronary artery without angina pectoris: Secondary | ICD-10-CM

## 2022-08-12 NOTE — Patient Instructions (Signed)
Medication Instructions:  1.Increase lasix to 40 mg daily for 3 days, then resume 20 mg daily 2.Increase potassium to 40 meq daily for 3 days, then resume 20 meq daily *If you need a refill on your cardiac medications before your next appointment, please call your pharmacy*   Lab Work: BMET in 1 week If you have labs (blood work) drawn today and your tests are completely normal, you will receive your results only by: MyChart Message (if you have MyChart) OR A paper copy in the mail If you have any lab test that is abnormal or we need to change your treatment, we will call you to review the results.   Testing/Procedures: Your physician has requested that you have an echocardiogram in August. Echocardiography is a painless test that uses sound waves to create images of your heart. It provides your doctor with information about the size and shape of your heart and how well your heart's chambers and valves are working. This procedure takes approximately one hour. There are no restrictions for this procedure. Please do NOT wear cologne, perfume, aftershave, or lotions (deodorant is allowed). Please arrive 15 minutes prior to your appointment time.    Follow-Up: At [facility], you and your health needs are our priority.  As part of our continuing mission to provide you with exceptional heart care, we have created designated Provider Care Teams.  These Care Teams include your primary Cardiologist (physician) and Advanced Practice Providers (APPs -  Physician Assistants and Nurse Practitioners) who all work together to provide you with the care you need, when you need it.  We recommend signing up for the patient portal called "MyChart".  Sign up information is provided on this After Visit Summary.  MyChart is used to connect with patients for Virtual Visits (Telemedicine).  Patients are able to view lab/test results, encounter notes, upcoming appointments, etc.  Non-urgent messages can be  sent to your provider as well.   To learn more about what you can do with MyChart, go to [email or web address].    Your next appointment:   4-5 month(s)  Provider:   Armanda Magic, MD    Other Instructions Weigh every morning after using the restroom, before breakfast and call and let us know if you have a weight gain of 2 lbs or more overnight or 5 lbs or more in a week.    Low-Sodium Eating Plan Sodium, which is an element that makes up salt, helps you maintain a healthy balance of fluids in your body. Too much sodium can increase your blood pressure and cause fluid and waste to be held in your body. Your health care provider or dietitian may recommend following this plan if you have high blood pressure (hypertension), kidney disease, liver disease, or heart failure. Eating less sodium can help lower your blood pressure, reduce swelling, and protect your heart, liver, and kidneys. What are tips for following this plan? Reading food labels The Nutrition Facts label lists the amount of sodium in one serving of the food. If you eat more than one serving, you must multiply the listed amount of sodium by the number of servings. Choose foods with less than 140 mg of sodium per serving. Avoid foods with 300 mg of sodium or more per serving. Shopping  Look for lower-sodium products, often labeled as "low-sodium" or "no salt added." Always check the sodium content, even if foods are labeled as "unsalted" or "no salt added." Buy fresh foods. Avoid canned foods and pre-made or  frozen meals. Avoid canned, cured, or processed meats. Buy breads that have less than 80 mg of sodium per slice. Cooking  Eat more home-cooked food and less restaurant, buffet, and fast food. Avoid adding salt when cooking. Use salt-free seasonings or herbs instead of table salt or sea salt. Check with your health care provider or pharmacist before using salt substitutes. Cook with plant-based oils, such as canola,  sunflower, or olive oil. Meal planning When eating at a restaurant, ask that your food be prepared with less salt or no salt, if possible. Avoid dishes labeled as brined, pickled, cured, smoked, or made with soy sauce, miso, or teriyaki sauce. Avoid foods that contain MSG (monosodium glutamate). MSG is sometimes added to [place] food, bouillon, and some canned foods. Make meals that can be grilled, baked, poached, roasted, or steamed. These are generally made with less sodium. General information Most people on this plan should limit their sodium intake to 1,500-2,000 mg (milligrams) of sodium each day. What foods should I eat? Fruits Fresh, frozen, or canned fruit. Fruit juice. Vegetables Fresh or frozen vegetables. "No salt added" canned vegetables. "No salt added" tomato sauce and paste. Low-sodium or reduced-sodium tomato and vegetable juice. Grains Low-sodium cereals, including oats, puffed wheat and rice, and shredded wheat. Low-sodium crackers. Unsalted rice. Unsalted pasta. Low-sodium bread. Whole-grain breads and whole-grain pasta. Meats and other proteins Fresh or frozen (no salt added) meat, poultry, seafood, and fish. Low-sodium canned tuna and salmon. Unsalted nuts. Dried peas, beans, and lentils without added salt. Unsalted canned beans. Eggs. Unsalted nut butters. Dairy Milk. Soy milk. Cheese that is naturally low in sodium, such as ricotta cheese, fresh mozzarella, or Swiss cheese. Low-sodium or reduced-sodium cheese. Cream cheese. Yogurt. Seasonings and condiments Fresh and dried herbs and spices. Salt-free seasonings. Low-sodium mustard and ketchup. Sodium-free salad dressing. Sodium-free light mayonnaise. Fresh or refrigerated horseradish. Lemon juice. Vinegar. Other foods Homemade, reduced-sodium, or low-sodium soups. Unsalted popcorn and pretzels. Low-salt or salt-free chips. The items listed above may not be a complete list of foods and beverages you can eat. Contact a  dietitian for more information. What foods should I avoid? Vegetables Sauerkraut, pickled vegetables, and relishes. Olives. [place] fries. Onion rings. Regular canned vegetables (not low-sodium or reduced-sodium). Regular canned tomato sauce and paste (not low-sodium or reduced-sodium). Regular tomato and vegetable juice (not low-sodium or reduced-sodium). Frozen vegetables in sauces. Grains Instant hot cereals. Bread stuffing, pancake, and biscuit mixes. Croutons. Seasoned rice or pasta mixes. Noodle soup cups. Boxed or frozen macaroni and cheese. Regular salted crackers. Self-rising flour. Meats and other proteins Meat or fish that is salted, canned, smoked, spiced, or pickled. Precooked or cured meat, such as sausages or meat loaves. Tomasa Blase. Ham. Pepperoni. Hot dogs. Corned beef. Chipped beef. Salt pork. Jerky. Pickled herring. Anchovies and sardines. Regular canned tuna. Salted nuts. Dairy Processed cheese and cheese spreads. Hard cheeses. Cheese curds. Blue cheese. Feta cheese. String cheese. Regular cottage cheese. Buttermilk. Canned milk. Fats and oils Salted butter. Regular margarine. Ghee. Bacon fat. Seasonings and condiments Onion salt, garlic salt, seasoned salt, table salt, and sea salt. Canned and packaged gravies. Worcestershire sauce. Tartar sauce. Barbecue sauce. Teriyaki sauce. Soy sauce, including reduced-sodium. Steak sauce. Fish sauce. Oyster sauce. Cocktail sauce. Horseradish that you find on the shelf. Regular ketchup and mustard. Meat flavorings and tenderizers. Bouillon cubes. Hot sauce. Pre-made or packaged marinades. Pre-made or packaged taco seasonings. Relishes. Regular salad dressings. Salsa. Other foods Salted popcorn and pretzels. Corn chips and puffs. Potato and  tortilla chips. Canned or dried soups. Pizza. Frozen entrees and pot pies. The items listed above may not be a complete list of foods and beverages you should avoid. Contact a dietitian for more  information. Summary Eating less sodium can help lower your blood pressure, reduce swelling, and protect your heart, liver, and kidneys. Most people on this plan should limit their sodium intake to 1,500-2,000 mg (milligrams) of sodium each day. Canned, boxed, and frozen foods are high in sodium. Restaurant foods, fast foods, and pizza are also very high in sodium. You also get sodium by adding salt to food. Try to cook at home, eat more fresh fruits and vegetables, and eat less fast food and canned, processed, or prepared foods. This information is not intended to replace advice given to you by your health care provider. Make sure you discuss any questions you have with your health care provider. Document Revised: 02/13/2019 Document Reviewed: 02/08/2019 Elsevier Patient Education  2023 [employer]  Heart-Healthy Eating Plan Many factors influence your heart health, including eating and exercise habits. Heart health is also called coronary health. Coronary risk increases with abnormal blood fat (lipid) levels. A heart-healthy eating plan includes limiting unhealthy fats, increasing healthy fats, limiting salt (sodium) intake, and making other diet and lifestyle changes. What is my plan? Your health care provider may recommend that: You limit your fat intake to _________% or less of your total calories each day. You limit your saturated fat intake to _________% or less of your total calories each day. You limit the amount of cholesterol in your diet to less than _________ mg per day. You limit the amount of sodium in your diet to less than _________ mg per day. What are tips for following this plan? Cooking Cook foods using methods other than frying. Baking, boiling, grilling, and broiling are all good options. Other ways to reduce fat include: Removing the skin from poultry. Removing all visible fats from meats. Steaming vegetables in water or broth. Meal planning  At meals, imagine  dividing your plate into fourths: Fill one-half of your plate with vegetables and green salads. Fill one-fourth of your plate with whole grains. Fill one-fourth of your plate with lean protein foods. Eat 2-4 cups of vegetables per day. One cup of vegetables equals 1 cup (91 g) broccoli or cauliflower florets, 2 medium carrots, 1 large bell pepper, 1 large sweet potato, 1 large tomato, 1 medium white potato, 2 cups (150 g) raw leafy greens. Eat 1-2 cups of fruit per day. One cup of fruit equals 1 small apple, 1 large banana, 1 cup (237 g) mixed fruit, 1 large orange,  cup (82 g) dried fruit, 1 cup (240 mL) 100% fruit juice. Eat more foods that contain soluble fiber. Examples include apples, broccoli, carrots, beans, peas, and barley. Aim to get 25-30 g of fiber per day. Increase your consumption of legumes, nuts, and seeds to 4-5 servings per week. One serving of dried beans or legumes equals  cup (90 g) cooked, 1 serving of nuts is  oz (12 almonds, 24 pistachios, or 7 walnut halves), and 1 serving of seeds equals  oz (8 g). Fats Choose healthy fats more often. Choose monounsaturated and polyunsaturated fats, such as olive and canola oils, avocado oil, flaxseeds, walnuts, almonds, and seeds. Eat more omega-3 fats. Choose salmon, mackerel, sardines, tuna, flaxseed oil, and ground flaxseeds. Aim to eat fish at least 2 times each week. Check food labels carefully to identify foods with trans fats or  high amounts of saturated fat. Limit saturated fats. These are found in animal products, such as meats, butter, and cream. Plant sources of saturated fats include palm oil, palm kernel oil, and coconut oil. Avoid foods with partially hydrogenated oils in them. These contain trans fats. Examples are stick margarine, some tub margarines, cookies, crackers, and other baked goods. Avoid fried foods. General information Eat more home-cooked food and less restaurant, buffet, and fast food. Limit or avoid  alcohol. Limit foods that are high in added sugar and simple starches such as foods made using white refined flour (white breads, pastries, sweets). Lose weight if you are overweight. Losing just 5-10% of your body weight can help your overall health and prevent diseases such as diabetes and heart disease. Monitor your sodium intake, especially if you have high blood pressure. Talk with your health care provider about your sodium intake. Try to incorporate more vegetarian meals weekly. What foods should I eat? Fruits All fresh, canned (in natural juice), or frozen fruits. Vegetables Fresh or frozen vegetables (raw, steamed, roasted, or grilled). Green salads. Grains Most grains. Choose whole wheat and whole grains most of the time. Rice and pasta, including brown rice and pastas made with whole wheat. Meats and other proteins Lean, well-trimmed beef, veal, pork, and lamb. Chicken and [place] without skin. All fish and shellfish. Wild duck, rabbit, pheasant, and venison. Egg whites or low-cholesterol egg substitutes. Dried beans, peas, lentils, and tofu. Seeds and most nuts. Dairy Low-fat or nonfat cheeses, including ricotta and mozzarella. Skim or 1% milk (liquid, powdered, or evaporated). Buttermilk made with low-fat milk. Nonfat or low-fat yogurt. Fats and oils Non-hydrogenated (trans-free) margarines. Vegetable oils, including soybean, sesame, sunflower, olive, avocado, peanut, safflower, corn, canola, and cottonseed. Salad dressings or mayonnaise made with a vegetable oil. Beverages Water (mineral or sparkling). Coffee and tea. Unsweetened ice tea. Diet beverages. Sweets and desserts Sherbet, gelatin, and fruit ice. Small amounts of dark chocolate. Limit all sweets and desserts. Seasonings and condiments All seasonings and condiments. The items listed above may not be a complete list of foods and beverages you can eat. Contact a dietitian for more options. What foods should I  avoid? Fruits Canned fruit in heavy syrup. Fruit in cream or butter sauce. Fried fruit. Limit coconut. Vegetables Vegetables cooked in cheese, cream, or butter sauce. Fried vegetables. Grains Breads made with saturated or trans fats, oils, or whole milk. Croissants. Sweet rolls. Donuts. High-fat crackers, such as cheese crackers and chips. Meats and other proteins Fatty meats, such as hot dogs, ribs, sausage, bacon, rib-eye roast or steak. High-fat deli meats, such as salami and bologna. Caviar. Domestic duck and goose. Organ meats, such as liver. Dairy Cream, sour cream, cream cheese, and creamed cottage cheese. Whole-milk cheeses. Whole or 2% milk (liquid, evaporated, or condensed). Whole buttermilk. Cream sauce or high-fat cheese sauce. Whole-milk yogurt. Fats and oils Meat fat, or shortening. Cocoa butter, hydrogenated oils, palm oil, coconut oil, palm kernel oil. Solid fats and shortenings, including bacon fat, salt pork, lard, and butter. Nondairy cream substitutes. Salad dressings with cheese or sour cream. Beverages Regular sodas and any drinks with added sugar. Sweets and desserts Frosting. Pudding. Cookies. Cakes. Pies. Milk chocolate or white chocolate. Buttered syrups. Full-fat ice cream or ice cream drinks. The items listed above may not be a complete list of foods and beverages to avoid. Contact a dietitian for more information. Summary Heart-healthy meal planning includes limiting unhealthy fats, increasing healthy fats, limiting salt (sodium) intake and making  other diet and lifestyle changes. Lose weight if you are overweight. Losing just 5-10% of your body weight can help your overall health and prevent diseases such as diabetes and heart disease. Focus on eating a balance of foods, including fruits and vegetables, low-fat or nonfat dairy, lean protein, nuts and legumes, whole grains, and heart-healthy oils and fats. This information is not intended to replace advice given to  you by your health care provider. Make sure you discuss any questions you have with your health care provider. Document Revised: 04/14/2021 Document Reviewed: 04/14/2021 Elsevier Patient Education  2023 [employer].

## 2022-08-12 NOTE — Progress Notes (Signed)
Office Visit    Patient Name: [name] Date of Encounter: 08/12/2022  PCP:  Kristian Covey, MD   [facility]  Cardiologist:  Armanda Magic, MD  Advanced Practice Provider:  No care team member to display Electrophysiologist:  None   HPI    [name] is a 77 y.o. female with a past medical history of retroperitoneal fibrosis and non-Hodgkin's lymphoma as well as prior DVT, history of coronary artery calcifications with moderate calcium score of 161 (putting her at high risk percentile for her age and sex matched controls), possible moderate mid LAD stenosis (images clouded by respiratory artifact) presents today for follow-up appointment.  Echocardiogram with LVEF 45 to 50% with moderate diastolic dysfunction.  Patient opted for medical therapy.  Was diagnosed with paroxysmal atrial fibrillation and was started on warfarin and diltiazem CD.  Echo in 8/15 showed EF 50 to 55%, moderate LV hypertrophy, probable mild mitral stenosis, normal RV size and systolic function.  She was initially on dronedarone to try to maintain normal sinus rhythm but developed persistent atrial fibrillation.  She was changed to amiodarone and underwent cardioversion to normal sinus rhythm on 4/17 but went back to atrial fibrillation.  The decision was made to pursue rate control and her Amio was stopped.  She also had a history of chronic diastolic CHF triggered by A-fib in the past.  She was changed from warfarin to Eliquis.  She was seen by Dr. Mayford Knife 10/07/2021 and at that time she denied any chest pain/pressure, SOB, DOE (except when she was having to rush for having back pain), PND, orthopnea, lower extremity edema, lightheadedness or syncope.  Occasionally would notice her heart skipped a beat but no fast heartbeats.  Compliant with medications.  Today, she tells me that she rarely had any skipped beats.  She states that they did shocked her 1 time for her atrial  fibrillation but it is not permanent.  She is told there is nothing to do.  Working on rate control.  She is in atrial fibrillation today, rate 87 bpm.  She states she has been very busy with his last 4 weeks and has been packing.  She Was Put on Prednisone and She Thinks This May Have Contributed to Some Leg Swelling.  She Is on a Prednisone Taper.  She Was Started on Prednisone Because She Was Given Doxycycline for Suspected Cellulitis (Does Not Look Infected Now) and she had an allergic reaction then required a prednisone taper.  It does look like she has chronic venous stasis changes on her legs with a possible healing ulcer.  She does not like drinking a lot of water because then she has to use the bathroom.   Reports no shortness of breath nor dyspnea on exertion. Reports no chest pain, pressure, or tightness. No orthopnea, PND.   Past Medical History    Past Medical History:  Diagnosis Date   ABDOMINAL PAIN RIGHT UPPER QUADRANT 11/08/2008   ALLERGIC RHINITIS 08/01/2009   Anxiety    Aortic stenosis    mild by echo 10/2021   Cataract    Complication of anesthesia 01/31/2015   ? doesn't remember anything after being taken to pre surgery   Coronary artery calcification seen on CAT scan 05/30/2009   moderate risk coronary calcium score by chest CT with mid LAD plaque>>patient opted for medical management   DCM (dilated cardiomyopathy) (HCC)    EF 45-50% by echo 8/23   DDD (degenerative disc disease), lumbar  DEPRESSION 06/13/2008   DYSLIPIDEMIA 02/27/2009   DYSPNEA 02/27/2009   Dysrhythmia    Afib   Frequency of urination    GERD (gastroesophageal reflux disease)    "years ago"   Head injury 2016   did not loose consciousness   History of Bell's palsy    History of DVT of lower extremity 2006-- CHRONIC COUMADIN THERAPY   History of hiatal hernia    History of Lyme disease    Hydronephrosis, left chronic -- secondary to retroperitoneal fibrosis   Hypertension    Lyme disease     NON-HODGKIN'S LYMPHOMA, HX OF dx  2005--  chemoradiation completed 2006--  no recurrence   onocologist- dr odogwu--    OSTEOARTHRITIS, MODERATE 04/07/2010   Permanent atrial fibrillation (HCC)    On chronic anti-coagulation with warfarin   Pneumonia    x 3 - last time 02/2018   Retroperitoneal fibrosis    Stroke (HCC)    ? - [place]- "you have had 3 strokes"   Past Surgical History:  Procedure Laterality Date   BREAST MASS EXCISION     BENIGN-- RIGHT BREAST   CARDIOVASCULAR STRESS TEST  04-04-2009-- PER PT ASYMPTOMATIC-- MEDICAL MANAGEMENT   STUDY WAS EQUIVOCAL/ EF 49%/ GLOBAL HYPOKINESIS/ APPEARS TO BE A MILD ANTERIOR PERFUSION DEFECT COULD REPRESENT ISCHEMIA BUT DUE TO  ACTIVITY WORSE IN STRESS THAN AT REST POSS. THE DEFECT WAS ARTIFACTUAL   CARDIOVERSION N/A 07/11/2015   Procedure: CARDIOVERSION;  Surgeon: Laurey Morale, MD;  [facility] ENDOSCOPY;  Service: Cardiovascular;  Laterality: N/A;   CT ANGIOGRAM  FEB 2011   CALCIUM SCORE 161/ POSS. MODERATE MID LAD STENOSIS   CYSTOSCOPY W/ RETROGRADES  04/28/2011   Procedure: CYSTOSCOPY WITH RETROGRADE PYELOGRAM;  Surgeon: Antony Haste, MD;  Location: [facility];  Service: Urology;  Laterality: Left;   CYSTOSCOPY W/ URETERAL STENT REMOVAL  04/28/2011   Procedure: CYSTOSCOPY WITH STENT REMOVAL;  Surgeon: Antony Haste, MD;  Location[facility];  Service: Urology;;   EXPLORATORY LAPAROTOMY  2005   LYMPHOMA   EYE SURGERY Bilateral    cataract    GAS INSERTION Right 03/31/2018   Procedure: RIGHT EYE INSERTION OF GAS C3F8;  Surgeon: Rennis Chris, MD;  Location: [facility] OR;  Service: Ophthalmology;  Laterality: Right;   KNEE ARTHROSCOPY  2005   RIGHT   LASER PHOTO ABLATION Right 03/31/2018   Procedure: RIGHT EYE LASER PHOTO ABLATION;  Surgeon: Rennis Chris, MD;  Location: [facility] OR;  Service: Ophthalmology;  Laterality: Right;   MULTIPLE CYSTO/ LEFT URETERAL STENT EXCHANGES  LAST ONE 10-28-10    RETROPERITONEAL BX  2007   REVERSE SHOULDER ARTHROPLASTY Right 01/31/2015   Procedure: RIGHT REVERSE SHOULDER ARTHROPLASTY;  Surgeon: Francena Hanly, MD;  Location: [facility] OR;  Service: Orthopedics;  Laterality: Right;   TONSILLECTOMY  CHILD   TOTAL KNEE ARTHROPLASTY  OCT 2010   RIGHT   TOTAL KNEE ARTHROPLASTY  JAN 2010   LEFT   TRANSTHORACIC ECHOCARDIOGRAM  05-03-2009   MILD SYSTOLIC DYSFUNCTION/ EF 45-50%/ MODERATE DIASTOLIC DYSFUCTION/ MILD MR/ MILD BIATRIAL ENLARGEMENT   VITRECTOMY 25 GAUGE WITH SCLERAL BUCKLE Right 03/31/2018   Procedure: RIGHT VITRECTOMY 25 GAUGE WITH SCLERAL BUCKLE;  Surgeon: Rennis Chris, MD;  Location: [facility] OR;  Service: Ophthalmology;  Laterality: Right;    Allergies  Allergies  Allergen Reactions   Chlorhexidine     Other reaction(s): Not available   Chlorhexidine Base Itching    CHG WIPES   Clindamycin/Lincomycin Other (See  Comments)    Caused fire like sensation in her stomach    Sulfa Antibiotics Other (See Comments)    Severe GI upset   Penicillins Hives and Rash    DID THE REACTION INVOLVE: Swelling of the face/tongue/throat, SOB, or low BP? No Sudden or severe rash/hives, skin peeling, or the inside of the mouth or nose? No Did it require medical treatment? No When did it last happen?  90s If all above answers are "NO", may proceed with cephalosporin use.     EKGs/Labs/Other Studies Reviewed:   The following studies were reviewed today: Cardiac Studies & Procedures     STRESS TESTS  MYOCARDIAL PERFUSION IMAGING 01/21/2017  Narrative  Nuclear stress EF: 46%.  Defect in the distal anterior, mid/distal anteroseptal and apical distributions consistent with scar and /or soft tissue attenuation. No ischemia.  This is an intermediate risk study.  Recommend echo to define wall motion, LVEF   ECHOCARDIOGRAM  ECHOCARDIOGRAM COMPLETE 10/23/2021  Narrative ECHOCARDIOGRAM REPORT    Patient Name:   JULIETH PUNG Date of Exam:  10/23/2021 Medical Rec #:  [id number]         Height:       66.0 in Accession #:    [id number]        Weight:       188.3 lb Date of Birth:  31-Jan-1946        BSA:          1.949 m Patient Age:    75 years          BP:           126/88 mmHg Patient Gender: F                 HR:           67 bpm. Exam Location:  [address]  Procedure: 2D Echo, Cardiac Doppler and Color Doppler  Indications:    I35.0 Aortic Stenosis  History:        Patient has prior history of Echocardiogram examinations, most recent 09/06/2020. Stroke, Arrythmias:Atrial Fibrillation, Signs/Symptoms:Dyspnea; Risk Factors:Hypertension and Dyslipidemia. Pneumonia. NonHodgkins Lymphoma. DVT.  Sonographer:    Sedonia Small Rodgers-Jones RDCS Referring Phys: [id number] TRACI R TURNER  IMPRESSIONS   1. Mild global reduction in LV systolic function; probable mild AS (AVA calculated at 1 cm2 but mean gradient 7 mmHg). 2. Left ventricular ejection fraction, by estimation, is 45 to 50%. The left ventricle has mildly decreased function. The left ventricle demonstrates global hypokinesis. Left ventricular diastolic function could not be evaluated. 3. Right ventricular systolic function is normal. The right ventricular size is moderately enlarged. 4. Left atrial size was severely dilated. 5. Right atrial size was moderately dilated. 6. The mitral valve is normal in structure. Mild mitral valve regurgitation. No evidence of mitral stenosis. Moderate mitral annular calcification. 7. The aortic valve is calcified. Aortic valve regurgitation is not visualized. Mild aortic valve stenosis. 8. The inferior vena cava is normal in size with greater than 50% respiratory variability, suggesting right atrial pressure of 3 mmHg.  FINDINGS Left Ventricle: Left ventricular ejection fraction, by estimation, is 45 to 50%. The left ventricle has mildly decreased function. The left ventricle demonstrates global hypokinesis. The left ventricular internal cavity  size was normal in size. There is no left ventricular hypertrophy. Left ventricular diastolic function could not be evaluated due to atrial fibrillation. Left ventricular diastolic function could not be evaluated.  Right Ventricle: The right ventricular size is moderately enlarged. Right ventricular  systolic function is normal.  Left Atrium: Left atrial size was severely dilated.  Right Atrium: Right atrial size was moderately dilated.  Pericardium: There is no evidence of pericardial effusion.  Mitral Valve: The mitral valve is normal in structure. Moderate mitral annular calcification. Mild mitral valve regurgitation. No evidence of mitral valve stenosis.  Tricuspid Valve: The tricuspid valve is normal in structure. Tricuspid valve regurgitation is mild . No evidence of tricuspid stenosis.  Aortic Valve: The aortic valve is calcified. Aortic valve regurgitation is not visualized. Mild aortic stenosis is present. Aortic valve mean gradient measures 7.0 mmHg. Aortic valve peak gradient measures 12.3 mmHg. Aortic valve area, by VTI measures 0.96 cm.  Pulmonic Valve: The pulmonic valve was normal in structure. Pulmonic valve regurgitation is mild. No evidence of pulmonic stenosis.  Aorta: The aortic root is normal in size and structure.  Venous: The inferior vena cava is normal in size with greater than 50% respiratory variability, suggesting right atrial pressure of 3 mmHg.  IAS/Shunts: No atrial level shunt detected by color flow Doppler.  Additional Comments: Mild global reduction in LV systolic function; probable mild AS (AVA calculated at 1 cm2 but mean gradient 7 mmHg).   LEFT VENTRICLE PLAX 2D LVIDd:         4.70 cm LVIDs:         3.40 cm LV PW:         0.80 cm LV IVS:        0.80 cm LVOT diam:     2.10 cm LV SV:         34 LV SV Index:   17 LVOT Area:     3.46 cm   RIGHT VENTRICLE             IVC RV Basal diam:  5.40 cm     IVC diam: 1.50 cm RV S prime:     10.67  cm/s TAPSE (M-mode): 1.8 cm  LEFT ATRIUM              Index        RIGHT ATRIUM           Index LA diam:        5.80 cm  2.98 cm/m   RA Area:     25.00 cm LA Vol (A2C):   110.0 ml 56.43 ml/m  RA Volume:   86.60 ml  44.43 ml/m LA Vol (A4C):   104.0 ml 53.35 ml/m LA Biplane Vol: 107.0 ml 54.89 ml/m AORTIC VALVE AV Area (Vmax):    1.00 cm AV Area (Vmean):   0.92 cm AV Area (VTI):     0.96 cm AV Vmax:           175.60 cm/s AV Vmean:          121.400 cm/s AV VTI:            0.352 m AV Peak Grad:      12.3 mmHg AV Mean Grad:      7.0 mmHg LVOT Vmax:         50.90 cm/s LVOT Vmean:        32.400 cm/s LVOT VTI:          0.097 m LVOT/AV VTI ratio: 0.28  AORTA Ao Root diam: 3.50 cm  MV E velocity: 99.50 cm/s  TRICUSPID VALVE TR Peak grad:   18.8 mmHg TR Vmax:        217.00 cm/s  SHUNTS Systemic VTI:  0.10 m Systemic  Diam: 2.10 cm  Olga Millers MD Electronically signed by Olga Millers MD Signature Date/Time: 10/23/2021/12:42:56 PM    Final              EKG:  EKG is  ordered today.  The ekg ordered today demonstrates atrial fibrillation, rate 87 bpm  Recent Labs: 10/07/2021: Hemoglobin 11.7; Platelets 171 10/20/2021: TSH 1.75 01/21/2022: ALT 16 02/11/2022: BUN 20; Creatinine, Ser 1.26; Potassium 4.4; Sodium 139  Recent Lipid Panel    Component Value Date/Time   CHOL 136 10/23/2021 0830   TRIG 95 10/23/2021 0830   HDL 56 10/23/2021 0830   CHOLHDL 2.4 10/23/2021 0830   CHOLHDL 2 10/20/2021 1602   VLDL 21.2 10/20/2021 1602   LDLCALC 62 10/23/2021 0830    Risk Assessment/Calculations:   CHA2DS2-VASc Score = 8   This indicates a 10.8% annual risk of stroke. The patient's score is based upon: CHF History: 1 HTN History: 1 Diabetes History: 0 Stroke History: 2 Vascular Disease History: 1 Age Score: 2 Gender Score: 1    Home Medications   Current Meds  Medication Sig   ALPRAZolam (XANAX) 1 MG tablet TAKE 1 TABLET BY MOUTH THREE TIMES A DAY AS NEEDED    apixaban (ELIQUIS) 5 MG TABS tablet Take 1 tablet (5 mg total) by mouth 2 (two) times daily.   b complex vitamins capsule Take 1 capsule by mouth daily.   buPROPion (WELLBUTRIN XL) 300 MG 24 hr tablet TAKE 1 TABLET BY MOUTH EVERY DAY   chlorhexidine (PERIDEX) 0.12 % solution USE AS DIRECTED. EXPECTORATE - DO NOT SWALLOW   Cholecalciferol (VITAMIN D) 2000 UNITS CAPS Take 2,000 Int'l Units by mouth.   Coenzyme Q10-Fish Oil-Vit E (CO-Q 10 OMEGA-3 FISH OIL PO) Take 100 mg by mouth daily.    diltiazem (CARDIZEM CD) 120 MG 24 hr capsule TAKE 1 CAPSULE BY MOUTH EVERY DAY   FLUAD QUADRIVALENT 0.5 ML injection    furosemide (LASIX) 20 MG tablet TAKE 1 TABLET BY MOUTH EVERY DAY   LIVALO 2 MG TABS TAKE 1 TABLET EVERY DAY   Lysine 500 MG TABS Take 500 mg by mouth daily.   metoprolol succinate (TOPROL-XL) 25 MG 24 hr tablet TAKE 1 TABLET (25 MG TOTAL) BY MOUTH DAILY.   pantoprazole (PROTONIX) 40 MG tablet Take 1 tablet (40 mg total) by mouth daily.   potassium chloride (KLOR-CON) 20 MEQ packet Take 20 mEq by mouth daily.   valACYclovir (VALTREX) 1000 MG tablet TAKE 2 TABLETS AT ONSET OF COLD SORES AND REPEAT 2 IN 12 HOURS.     Review of Systems      All other systems reviewed and are otherwise negative except as noted above.  Physical Exam    VS:  BP 102/74   Pulse 87   Ht 5\' 6"  (1.676 m)   Wt 176 lb (79.8 kg)   SpO2 97%   BMI 28.41 kg/m  , BMI Body mass index is 28.41 kg/m.  Wt Readings from Last 3 Encounters:  08/12/22 176 lb (79.8 kg)  07/23/22 175 lb 3.2 oz (79.5 kg)  07/13/22 185 lb (83.9 kg)     GEN: Well nourished, well developed, in no acute distress. HEENT: normal. Neck: Supple, no JVD, carotid bruits, or masses. Cardiac: Irregularly irregular, no murmurs, rubs, or gallops. No clubbing, cyanosis, 1-2 + pitting LE edema.  Radials/PT 2+ and equal bilaterally.  Respiratory:  Respirations regular and unlabored, clear to auscultation bilaterally. GI: Soft, nontender,  nondistended. MS: No deformity or atrophy.  Skin: Warm and dry, no rash. Neuro:  Strength and sensation are intact. Psych: Normal affect.  Assessment & Plan    LE edema/dilated CM/chronic diastolic CHF -She get better as she is weaning off prednisone -For now can increase Lasix to 40 mg for 3 days as well as potassium to 40 mEq for 3 days, please go back to 20 mg of Lasix and 20 mg of potassium after 3 doses -We will check a BMP in a week -Elevate legs when able -Maintain a low-sodium, healthy diet  Coronary artery calcification seen on CTA -no chest pain -Continue Eliquis 5 mg twice a day, Cardizem 120 mg daily, metoprolol succinate 25 mg daily   Permanent atrial fibrillation -continue current medications -rate controlled today and asymptomatic  Mild aortic stenosis -Plan for repeat echocardiogram 10/2022, stable results last year -continue current medications  Hypertension -Low normal today, 102/74 -continue to track BP at home -continue current medications  Hyperlipidemia -Recent lab work revealed LDL 62 (10/2021) -She will need an updated lipid panel next clinic       Disposition: Follow up 4-5 months  with Armanda Magic, MD or APP.  Signed, Sharlene Dory, PA-C 08/12/2022, 5:15 PM [facility]

## 2022-08-14 ENCOUNTER — Telehealth: Payer: Self-pay | Admitting: Physician Assistant

## 2022-08-14 ENCOUNTER — Other Ambulatory Visit: Payer: Self-pay | Admitting: Family Medicine

## 2022-08-14 NOTE — Telephone Encounter (Signed)
Called patient to advise she follow up w/ PCP about spasms in bilateral hands per T. Asa Lente, PA-C. Patient verbalizes understanding to do this and to continue with increased lasix doses as instructed. She states her leg swelling is already improved with increased lasix and leg elevation. BMET scheduled for 08/19/22.

## 2022-08-14 NOTE — Telephone Encounter (Signed)
Patient called stated she had OV on 5/22 for BLE swelling. Patient was advised: -For now can increase Lasix to 40 mg for 3 days as well as potassium to 40 mEq for 3 days, please go back to 20 mg of Lasix and 20 mg of potassium after 3 dose .  Patient stated she is currently selling her house, she was on her feet for a while yesterday, as of last night her left ankle and foot was extremely swollen. Pt stated she did elevate her leg over night and the swelling did decrease. Pt stated she for got the mentioned during her OV, for the past 3 days she has been experiencing spasms in both hands. Explained ED precautions, pt voiced understanding. Will forward to APP for advise.

## 2022-08-14 NOTE — Telephone Encounter (Signed)
Patient had an appt with Tessa yesterday. Would like a call back

## 2022-08-19 ENCOUNTER — Ambulatory Visit: Payer: Medicare Other | Attending: Physician Assistant

## 2022-08-24 ENCOUNTER — Ambulatory Visit: Payer: Medicare Other | Admitting: Family Medicine

## 2022-08-26 ENCOUNTER — Other Ambulatory Visit: Payer: Self-pay | Admitting: Cardiology

## 2022-08-26 ENCOUNTER — Other Ambulatory Visit: Payer: Self-pay | Admitting: Family Medicine

## 2022-08-26 DIAGNOSIS — I4819 Other persistent atrial fibrillation: Secondary | ICD-10-CM

## 2022-08-26 DIAGNOSIS — I4821 Permanent atrial fibrillation: Secondary | ICD-10-CM

## 2022-08-26 DIAGNOSIS — F324 Major depressive disorder, single episode, in partial remission: Secondary | ICD-10-CM

## 2022-08-26 MED ORDER — DILTIAZEM HCL ER COATED BEADS 120 MG PO CP24: 120.0000 mg | ORAL_CAPSULE | Freq: Every day | ORAL | 3 refills | Status: DC

## 2022-08-26 MED ORDER — APIXABAN 5 MG PO TABS
5.0000 mg | ORAL_TABLET | Freq: Two times a day (BID) | ORAL | 1 refills | Status: DC
Start: 2022-08-26 — End: 2022-11-06

## 2022-08-26 MED ORDER — BUPROPION HCL ER (XL) 300 MG PO TB24
300.0000 mg | ORAL_TABLET | Freq: Every day | ORAL | 0 refills | Status: AC
Start: 2022-08-26 — End: ?

## 2022-08-26 MED ORDER — POTASSIUM CHLORIDE 20 MEQ PO PACK: 20.0000 meq | PACK | Freq: Every day | ORAL | 11 refills | Status: DC

## 2022-08-26 MED ORDER — PITAVASTATIN CALCIUM 2 MG PO TABS: 1.0000 | ORAL_TABLET | Freq: Every day | ORAL | 3 refills | Status: DC

## 2022-08-26 MED ORDER — FUROSEMIDE 20 MG PO TABS: 20.0000 mg | ORAL_TABLET | Freq: Every day | ORAL | 3 refills | Status: DC

## 2022-08-26 MED ORDER — METOPROLOL SUCCINATE ER 25 MG PO TB24
25.0000 mg | ORAL_TABLET | Freq: Every day | ORAL | 3 refills | Status: DC
Start: 2022-08-26 — End: 2023-08-31

## 2022-08-26 NOTE — Telephone Encounter (Signed)
Bupropion refilled.

## 2022-08-26 NOTE — Telephone Encounter (Signed)
Pt's medications were sent to pt's pharmacy as requested. Confirmation received.  

## 2022-08-26 NOTE — Telephone Encounter (Signed)
*  STAT* If patient is at the pharmacy, call can be transferred to refill team.   1. Which medications need to be refilled? (please list name of each medication and dose if known) apixaban (ELIQUIS) 5 MG TABS tablet ; diltiazem (CARDIZEM CD) 120 MG 24 hr capsule ; furosemide (LASIX) 20 MG tablet ; LIVALO 2 MG TABS ; metoprolol succinate (TOPROL-XL) 25 MG 24 hr tablet ; potassium chloride (KLOR-CON) 20 MEQ packet ;   2. Which pharmacy/location (including street and city if local pharmacy) is medication to be sent to? [id number] - [place] - [address]   3. Do they need a 30 day or 90 day supply? 30

## 2022-08-26 NOTE — Telephone Encounter (Signed)
Prescription Request  08/26/2022  LOV: 01/21/2022  seen by Salomon Fick 07/23/22  What is the name of the medication or equipment? ALPRAZolam (XANAX) 1 MG tablet  buPROPion (WELLBUTRIN XL) 300 MG 24 hr tablet  Have you contacted your pharmacy to request a refill? No   Which pharmacy would you like this sent to?   [id number] - [place], [place] - [address] AT [facility] [address] [place] [address] Phone: [phone] Fax: [phone]    Patient notified that their request is being sent to the clinical staff for review and that they should receive a response within 2 business days.   Please advise at Mobile [phone] (mobile)

## 2022-08-26 NOTE — Telephone Encounter (Signed)
Prescription refill request for Eliquis received. Indication: AF Last office visit: 08/12/22  T Conte PA-C Scr: 1.26 on 02/11/22  Epic Age: 77 Weight: 79.8kg  Based on above findings Eliquis 5mg  twice daily is the appropriate dose.  Refill approved.

## 2022-08-27 MED ORDER — ALPRAZOLAM 1 MG PO TABS: 1.0000 mg | ORAL_TABLET | Freq: Three times a day (TID) | ORAL | 3 refills | Status: DC | PRN

## 2022-09-18 ENCOUNTER — Telehealth: Payer: Self-pay | Admitting: Cardiology

## 2022-09-18 DIAGNOSIS — I5032 Chronic diastolic (congestive) heart failure: Secondary | ICD-10-CM

## 2022-09-18 NOTE — Telephone Encounter (Signed)
Pt c/o swelling: STAT is pt has developed SOB within 24 hours  How much weight have you gained and in what time span? No  If swelling, where is the swelling located? Just the left ankle is swollen  Are you currently taking a fluid pill? furosemide (LASIX) 20 MG tablet   Are you currently SOB? No  Do you have a log of your daily weights (if so, list)?   Have you gained 3 pounds in a day or 5 pounds in a week?   Have you traveled recently? No  Has been moving, and been up moving around a lot. States her ankle is leaking - when she touches it, it is wet. Looks like clear fluid. Says there is no cut, thinks it is coming from her pores. She said the last time she had swelling - she was advised to double up on lasix, and "something else" x3 days. She wants to know if she needs to do that again.   Pt also states that she has not gotten the labs done that Tessa ordered for 08/12/22. She asked about lab hours and appointments - advised of [facility] and [facility] and states she will try to get to the [facility] office after/before her podiatry appt.

## 2022-09-18 NOTE — Telephone Encounter (Addendum)
Called patient back about her message. Patient complaining of left ankle swelling and some weeping. Patient stated she had no pain, and it's cool to touch in and around the ankle area. Patient stated she has discoloration to both ankles, but this is not new. Patient stated last time Dr. Mayford Knife had her take extra lasix for 3 days. Patient stated she was going to do this without a doctors order. Patient is over due for BMET and she will get BMET next week when she is near a [employer]. Encouraged patient to go to urgent care to get her ankle evaluated. Advised patient to elevate her legs and keep a low salt diet. Will forward to Dr. Mayford Knife for advisement.

## 2022-09-21 NOTE — Telephone Encounter (Signed)
Spoke to the pt, explained Dr. Norris Cross recommendation: Please get her in with her PCP tomorrow to make sure she does not have an infection  She stated wrapped her ankle and it the weeping and swelling has decreased. Encourage to contact PCP, remind pt of repeat BMET this week  and to keep her legs elevated. Pt voiced understanding

## 2022-09-22 ENCOUNTER — Ambulatory Visit: Payer: Medicare Other | Admitting: Podiatry

## 2022-09-22 ENCOUNTER — Encounter: Payer: Self-pay | Admitting: Podiatry

## 2022-09-22 ENCOUNTER — Other Ambulatory Visit: Payer: Self-pay

## 2022-09-22 DIAGNOSIS — D689 Coagulation defect, unspecified: Secondary | ICD-10-CM

## 2022-09-22 DIAGNOSIS — I420 Dilated cardiomyopathy: Secondary | ICD-10-CM | POA: Diagnosis not present

## 2022-09-22 DIAGNOSIS — I4819 Other persistent atrial fibrillation: Secondary | ICD-10-CM

## 2022-09-22 DIAGNOSIS — B351 Tinea unguium: Secondary | ICD-10-CM

## 2022-09-22 DIAGNOSIS — L84 Corns and callosities: Secondary | ICD-10-CM

## 2022-09-22 DIAGNOSIS — I4821 Permanent atrial fibrillation: Secondary | ICD-10-CM

## 2022-09-22 DIAGNOSIS — I10 Essential (primary) hypertension: Secondary | ICD-10-CM | POA: Diagnosis not present

## 2022-09-22 DIAGNOSIS — I350 Nonrheumatic aortic (valve) stenosis: Secondary | ICD-10-CM | POA: Diagnosis not present

## 2022-09-22 DIAGNOSIS — L97521 Non-pressure chronic ulcer of other part of left foot limited to breakdown of skin: Secondary | ICD-10-CM | POA: Diagnosis not present

## 2022-09-22 DIAGNOSIS — M79676 Pain in unspecified toe(s): Secondary | ICD-10-CM

## 2022-09-22 DIAGNOSIS — I2510 Atherosclerotic heart disease of native coronary artery without angina pectoris: Secondary | ICD-10-CM

## 2022-09-22 DIAGNOSIS — E7800 Pure hypercholesterolemia, unspecified: Secondary | ICD-10-CM

## 2022-09-22 DIAGNOSIS — I5032 Chronic diastolic (congestive) heart failure: Secondary | ICD-10-CM

## 2022-09-22 MED ORDER — GENTAMICIN SULFATE 0.1 % EX CREA
TOPICAL_CREAM | CUTANEOUS | 0 refills | Status: DC
Start: 2022-09-22 — End: 2022-09-28

## 2022-09-22 NOTE — Patient Instructions (Signed)
DRESSING CHANGES LEFT 2ND TOE:   PHARMACY SHOPPING LIST: Saline or Wound Cleanser for cleaning wound 2 x 2 inch sterile gauze for cleaning wound GENTAMICIN CREAM  KEEP LEFT FOOT DRY AT ALL TIMES!!!!  CLEANSE ULCER WITH SALINE OR WOUND CLEANSER.  DAB DRY WITH GAUZE SPONGE.  APPLY A LIGHT AMOUNT OF GENTAMICIN CREAM TO BASE OF ULCER.  APPLY OUTER DRESSING AS INSTRUCTED.  WEAR SURGICAL SHOE/BOOT DAILY AT ALL TIMES. IF SUPPLIED, WEAR HEEL PROTECTORS AT ALL TIMES WHEN IN BED.  DO NOT WALK BAREFOOT!!!  IF YOU EXPERIENCE ANY FEVER, CHILLS, NIGHTSWEATS, NAUSEA OR VOMITING, ELEVATED OR LOW BLOOD SUGARS, REPORT TO EMERGENCY ROOM.  IF YOU EXPERIENCE INCREASED REDNESS, PAIN, SWELLING, DISCOLORATION, ODOR, PUS, DRAINAGE OR WARMTH OF YOUR FOOT, REPORT TO EMERGENCY ROOM.

## 2022-09-23 LAB — BASIC METABOLIC PANEL
BUN/Creatinine Ratio: 16 (ref 12–28)
BUN: 19 mg/dL (ref 8–27)
CO2: 25 mmol/L (ref 20–29)
Calcium: 9.5 mg/dL (ref 8.7–10.3)
Chloride: 105 mmol/L (ref 96–106)
Creatinine, Ser: 1.16 mg/dL — ABNORMAL HIGH (ref 0.57–1.00)
Glucose: 97 mg/dL (ref 70–99)
Potassium: 5 mmol/L (ref 3.5–5.2)
Sodium: 144 mmol/L (ref 134–144)
eGFR: 48 mL/min/{1.73_m2} — ABNORMAL LOW (ref >59)

## 2022-09-23 NOTE — Progress Notes (Signed)
Subjective:  Patient ID: [name], female    DOB: [birth year],  MRN: [id number]  [name] presents to clinic today for at risk foot care with h/o clotting disorder and preulcerative lesion(s) both feet and painful mycotic toenails that limit ambulation. Painful toenails interfere with ambulation. Aggravating factors include wearing enclosed shoe gear. Pain is relieved with periodic professional debridement. Painful preulcerative lesion(s) is/are aggravated when weightbearing with and without shoegear. Pain is relieved with periodic professional debridement.  Chief Complaint  Patient presents with   NAIL CARE    RFC   Callouses    CORNS RT 3RD, LT 2ND AND 5TH ABN SIGNED   New problem(s):  Painful left 2nd toe. Patient states it has been over 4 months since her last appointment  PCP is Kristian Covey, MD.  Allergies  Allergen Reactions   Chlorhexidine     Other reaction(s): Not available   Chlorhexidine Base Itching    CHG WIPES   Clindamycin/Lincomycin Other (See Comments)    Caused fire like sensation in her stomach    Sulfa Antibiotics Other (See Comments)    Severe GI upset   Penicillins Hives and Rash    DID THE REACTION INVOLVE: Swelling of the face/tongue/throat, SOB, or low BP? No Sudden or severe rash/hives, skin peeling, or the inside of the mouth or nose? No Did it require medical treatment? No When did it last happen?  90s If all above answers are "NO", may proceed with cephalosporin use.     Review of Systems: Negative except as noted in the HPI.  Objective: No changes noted in today's physical examination. There were no vitals filed for this visit. [name] is a pleasant 77 y.o. female in NAD. AAO x 3.  Vascular Examination: Capillary refill time immediate b/l. Vascular status intact b/l with palpable pedal pulses. Pedal hair present b/l. No pain with calf compression b/l. Skin temperature gradient WNL b/l. No cyanosis or clubbing  b/l. No ischemia or gangrene noted b/l.   Neurological Examination: Sensation grossly intact b/l with 10 gram monofilament. Vibratory sensation intact b/l.   Dermatological Examination: Pedal skin with normal turgor, texture and tone b/l.  No open wounds. No interdigital macerations.   Toenails 1-5 b/l thick, discolored, elongated with subungual debris and pain on dorsal palpation.     Wound Location: L 2nd toe There is a minimal amount of devitalized tissue present in the wound. Predebridement Wound Measurement:  1.0 cm hyperkeratotic lesion with subdermal hemorrhage. Postdebridement Wound Measurement: 0.3 x 0.3 x 0.1 cm. Wound Base: Granular/Healthy Peri-wound: Normal Exudate: Scant/small amount Serous exudate Blood Loss during debridement: 0 cc('s). Material in wound which inhibits healing/promotes adjacent tissue breakdown:  necrotic tissue. Description of tissue removed from ulceration today:  necrotic tissue. Sign(s) of clinical bacterial infection: no clinical signs of infection noted on examination today.  Musculoskeletal Examination: Muscle strength 5/5 to all lower extremity muscle groups bilaterally. No pain, crepitus or joint limitation noted with ROM bilateral LE. Hammertoe deformity noted 1-5 b/l.  Radiographs: None  Assessment/Plan: 1. Pain due to onychomycosis of toenail   2. Skin ulcer of second toe of left foot, limited to breakdown of skin (HCC)   3. Clotting disorder (HCC)     Meds ordered this encounter  Medications   gentamicin cream (GARAMYCIN) 0.1 %    Sig: Apply to left 2nd toe once daily.    Dispense:  15 g    Refill:  0  Plan: -Patient was evaluated and treated and  all questions answered.  -Patient/POA/Family member educated on diagnosis and treatment plan of routine ulcer debridement/wound care.  -Ulceration debridement achieved utilizing sharp excisional debridement with sterile tissue nipper and sterile scalpel blade.. Type/amount of  devitalized tissue removed: necrotic tissue -Today's ulcer size post-debridement: 0.3 x 0.3 x 0.1 cm. -Ulceration cleansed with wound cleanser. triple antibiotic ointment applied to base of ulceration and secured with light dressing. -Wound responded well to today's debridement. -Patient risk factors affecting healing of ulcer: foot deformity, history of foot/leg ulcer -[name] given written instructions on daily wound care for L 2nd toe ulceration. -Prescription written for Gentamicin Cream. Patient is to apply to R 2nd toe once daily and cover with dressing.  -Toenails 1-5 b/l were debrided in length and girth with sterile nail nippers and dremel without iatrogenic bleeding.  -Patient/POA to call should there be question/concern in the interim.   Return in about 2 weeks (around 10/06/2022) for ulcer left 2nd toe with Dr. Nicholes Rough.  Freddie Breech, DPM

## 2022-09-27 ENCOUNTER — Other Ambulatory Visit: Payer: Self-pay | Admitting: Podiatry

## 2022-09-28 ENCOUNTER — Other Ambulatory Visit: Payer: Self-pay | Admitting: Podiatry

## 2022-09-28 DIAGNOSIS — L97521 Non-pressure chronic ulcer of other part of left foot limited to breakdown of skin: Secondary | ICD-10-CM

## 2022-09-28 MED ORDER — GENTAMICIN SULFATE 0.1 % EX CREA
TOPICAL_CREAM | CUTANEOUS | 1 refills | Status: DC
Start: 2022-09-28 — End: 2023-10-04

## 2022-09-28 NOTE — Progress Notes (Signed)
Patient called, stating she lost her Rx gentamicin ointment and asked if new one could be sent in.  Will do this now for patient.    She did already call her pharmacy and told them what happened.  They informed her that it might not be covered this soon and it would be ~$20.  She still plans on getting it.  She thanked [place] for calling back

## 2022-09-30 ENCOUNTER — Telehealth: Payer: Self-pay | Admitting: Physician Assistant

## 2022-09-30 NOTE — Telephone Encounter (Signed)
Spoke with patient and provided her with lab results to which she states her understanding and appreciation for the call.

## 2022-09-30 NOTE — Telephone Encounter (Signed)
Patient returned call for lab results.  

## 2022-10-09 ENCOUNTER — Encounter: Payer: Self-pay | Admitting: Podiatry

## 2022-10-09 ENCOUNTER — Ambulatory Visit (INDEPENDENT_AMBULATORY_CARE_PROVIDER_SITE_OTHER): Payer: Medicare Other | Admitting: Podiatry

## 2022-10-09 DIAGNOSIS — L97521 Non-pressure chronic ulcer of other part of left foot limited to breakdown of skin: Secondary | ICD-10-CM | POA: Diagnosis not present

## 2022-10-09 DIAGNOSIS — Q828 Other specified congenital malformations of skin: Secondary | ICD-10-CM

## 2022-10-09 MED ORDER — LIDOCAINE 5 % EX OINT: 1.0000 | TOPICAL_OINTMENT | CUTANEOUS | 0 refills | Status: AC | PRN

## 2022-10-09 NOTE — Progress Notes (Signed)
Subjective:  Patient ID: [name], female    DOB: 29-Apr-1945,  MRN: [id number]  Chief Complaint  Patient presents with   Callouses   Nail Care       Pt has a  Painful left 2nd toe. Patient states it has been over 4 months since her last appointment    77 y.o. female presents with the above complaint.  Patient presents with follow-up of left second digit skin ulcer/porokeratotic lesion.  Patient states she is doing better her pain has gotten better.  She states been going on for 4 months however it opened up the lesion so she was here to follow-up for further care.  She denies any other acute complaint she is known to [place].   Review of Systems: Negative except as noted in the HPI. Denies N/V/F/Ch.  Past Medical History:  Diagnosis Date   ABDOMINAL PAIN RIGHT UPPER QUADRANT 11/08/2008   ALLERGIC RHINITIS 08/01/2009   Anxiety    Aortic stenosis    mild by echo 10/2021   Cataract    Complication of anesthesia 01/31/2015   ? doesn't remember anything after being taken to pre surgery   Coronary artery calcification seen on CAT scan 05/30/2009   moderate risk coronary calcium score by chest CT with mid LAD plaque>>patient opted for medical management   DCM (dilated cardiomyopathy) (HCC)    EF 45-50% by echo 8/23   DDD (degenerative disc disease), lumbar    DEPRESSION 06/13/2008   DYSLIPIDEMIA 02/27/2009   DYSPNEA 02/27/2009   Dysrhythmia    Afib   Frequency of urination    GERD (gastroesophageal reflux disease)    "years ago"   Head injury 2016   did not loose consciousness   History of Bell's palsy    History of DVT of lower extremity 2006-- CHRONIC COUMADIN THERAPY   History of hiatal hernia    History of Lyme disease    Hydronephrosis, left 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 - [place]- "you have had 3 strokes"    Current Outpatient Medications:    lidocaine (XYLOCAINE) 5 % ointment, Apply 1 Application topically as needed., Disp: 35.44 g, Rfl: 0   ALPRAZolam (XANAX) 1 MG tablet, Take 1 tablet (1 mg total) by mouth 3 (three) times daily as needed., Disp: 90 tablet, Rfl: 3   apixaban (ELIQUIS) 5 MG TABS tablet, Take 1 tablet (5 mg total) by mouth 2 (two) times daily., Disp: 180 tablet, Rfl: 1   b complex vitamins capsule, Take 1 capsule by mouth daily., Disp: , Rfl:    buPROPion (WELLBUTRIN XL) 300 MG 24 hr tablet, Take 1 tablet (300 mg total) by mouth daily., Disp: 90 tablet, Rfl: 0   chlorhexidine (PERIDEX) 0.12 % solution, USE AS DIRECTED. EXPECTORATE - DO NOT SWALLOW, Disp: 473 mL, Rfl: 0   Cholecalciferol (VITAMIN D) 2000 UNITS CAPS, Take 2,000 Int'l Units by mouth., Disp: , Rfl:    Coenzyme Q10-Fish Oil-Vit E (CO-Q 10 OMEGA-3 FISH OIL PO), Take 100 mg by mouth daily. , Disp: , Rfl:  diltiazem (CARDIZEM CD) 120 MG 24 hr capsule, Take 1 capsule (120 mg total) by mouth daily., Disp: 90 capsule, Rfl: 3   FLUAD QUADRIVALENT 0.5 ML injection, , Disp: , Rfl:    furosemide (LASIX) 20 MG tablet, Take 1 tablet (20 mg total) by mouth daily., Disp: 90 tablet, Rfl: 3   gentamicin cream (GARAMYCIN) 0.1 %, Apply to affected toes once daily., Disp: 15 g, Rfl: 1   Lysine 500 MG TABS, Take 500 mg by mouth daily., Disp: , Rfl:    metoprolol succinate (TOPROL-XL) 25 MG 24 hr tablet, Take 1 tablet (25 mg total) by mouth daily., Disp: 90 tablet, Rfl: 3   pantoprazole (PROTONIX) 40 MG tablet, TAKE 1 TABLET BY MOUTH EVERY DAY, Disp: 90 tablet, Rfl: 0   Pitavastatin Calcium (LIVALO) 2 MG TABS, Take 1 tablet (2 mg total) by mouth daily., Disp: 90 tablet, Rfl: 3   potassium chloride (KLOR-CON) 20  MEQ packet, Take 20 mEq by mouth daily., Disp: 30 each, Rfl: 11   valACYclovir (VALTREX) 1000 MG tablet, TAKE 2 TABLETS AT ONSET OF COLD SORES AND REPEAT 2 IN 12 HOURS., Disp: 30 tablet, Rfl: 0  Social History   Tobacco Use  Smoking Status Former   Current packs/day: 0.00   Average packs/day: 2.0 packs/day for 52.0 years (104.0 ttl pk-yrs)   Types: Cigarettes   Start date: 03/24/1956   Quit date: 03/24/2008   Years since quitting: 14.5  Smokeless Tobacco Never    Allergies  Allergen Reactions   Chlorhexidine     Other reaction(s): Not available   Chlorhexidine Base Itching    CHG WIPES   Clindamycin/Lincomycin Other (See Comments)    Caused fire like sensation in her stomach    Sulfa Antibiotics Other (See Comments)    Severe GI upset   Penicillins Hives and Rash    DID THE REACTION INVOLVE: Swelling of the face/tongue/throat, SOB, or low BP? No Sudden or severe rash/hives, skin peeling, or the inside of the mouth or nose? No Did it require medical treatment? No When did it last happen?  90s If all above answers are "NO", may proceed with cephalosporin use.    Objective:  There were no vitals filed for this visit. There is no height or weight on file to calculate BMI. Constitutional Well developed. Well nourished.  Vascular Dorsalis pedis pulses palpable bilaterally. Posterior tibial pulses palpable bilaterally. Capillary refill normal to all digits.  No cyanosis or clubbing noted. Pedal hair growth normal.  Neurologic Normal speech. Oriented to person, place, and time. Epicritic sensation to light touch grossly present bilaterally.  Dermatologic Left second digit porokeratotic lesion the ulceration is completely epithelialized.  No signs of ulcer noted.  Orthopedic: Normal joint ROM without pain or crepitus bilaterally. No visible deformities. No bony tenderness.   Radiographs: None Assessment:   1. Skin ulcer of second toe of left foot, limited to breakdown of  skin (HCC)   2. Porokeratosis    Plan:  Patient was evaluated and treated and all questions answered.  Left second digit preulcerative callus with underlying porokeratotic lesion -Questions and concerns were discussed with the patient in extensive detail -Given the amount of pain that she is having I believe she would benefit from LidoCream cream to be applied to help with the pain. -I discussed shoe gear modification padding protecting offloading and spacers.  She states understanding if any foot or ankle issues arise in the future she will come back and see me.  No  concern for ulcers at this time  No follow-ups on file.

## 2022-10-19 ENCOUNTER — Ambulatory Visit: Payer: Medicare Other | Attending: Cardiology | Admitting: Cardiology

## 2022-10-19 ENCOUNTER — Ambulatory Visit (HOSPITAL_BASED_OUTPATIENT_CLINIC_OR_DEPARTMENT_OTHER): Payer: Medicare Other

## 2022-10-19 ENCOUNTER — Encounter: Payer: Self-pay | Admitting: Cardiology

## 2022-10-19 VITALS — BP 130/92 | HR 75 | Ht 66.0 in | Wt 178.8 lb

## 2022-10-19 DIAGNOSIS — I350 Nonrheumatic aortic (valve) stenosis: Secondary | ICD-10-CM | POA: Diagnosis not present

## 2022-10-19 DIAGNOSIS — E7800 Pure hypercholesterolemia, unspecified: Secondary | ICD-10-CM

## 2022-10-19 DIAGNOSIS — I5032 Chronic diastolic (congestive) heart failure: Secondary | ICD-10-CM

## 2022-10-19 DIAGNOSIS — I420 Dilated cardiomyopathy: Secondary | ICD-10-CM

## 2022-10-19 DIAGNOSIS — Z79899 Other long term (current) drug therapy: Secondary | ICD-10-CM

## 2022-10-19 DIAGNOSIS — I10 Essential (primary) hypertension: Secondary | ICD-10-CM | POA: Diagnosis not present

## 2022-10-19 DIAGNOSIS — I4821 Permanent atrial fibrillation: Secondary | ICD-10-CM | POA: Diagnosis not present

## 2022-10-19 DIAGNOSIS — I2510 Atherosclerotic heart disease of native coronary artery without angina pectoris: Secondary | ICD-10-CM

## 2022-10-19 LAB — ECHOCARDIOGRAM COMPLETE
AR max vel: 1.28 cm2
AV Area VTI: 1.31 cm2
AV Area mean vel: 1.37 cm2
AV Mean grad: 9 mmHg
AV Peak grad: 16.6 mmHg
Ao pk vel: 2.04 m/s
Area-P 1/2: 3.93 cm2
Est EF: 50
Height: 66 in
S' Lateral: 2.8 cm
Weight: 2860.8 oz

## 2022-10-19 NOTE — Addendum Note (Signed)
Addended by: Luellen Pucker on: 10/19/2022 03:57 PM   Modules accepted: Orders

## 2022-10-19 NOTE — Progress Notes (Signed)
Cardiology Office Note:    Date:  10/19/2022   ID:  [name], DOB 02-07-1946, MRN [id number]  PCP:  Angela Covey, MD  Cardiologist:  Angela Magic, MD    Referring MD: Angela Covey, MD   Chief Complaint  Patient presents with   Atrial Fibrillation   Hypertension   Hyperlipidemia   Coronary Artery Disease   Cardiomyopathy    History of Present Illness:    [name] is a 77 y.o. female with a hx of retroperitoneal fibrosis and non-Hodgkin's lymphoma as well as prior DVT.  She has a history of coronary artery calcifications with a moderate calcium score of 161 (putting her at a high risk percentile for her age) and a possible moderate mid LAD stenosis (though some degradation of images by respiratory artifact). Echo showed mildly reduced  LV systolic function (reported as mildly decreased on myoview),  EF 45-50% with moderate diastolic dysfunction. The patient opted for medical therapy.    She was diagnosed with paroxysmal atrial fibrillation and was started on warfarin and diltiazem CD.  Echo in 8/15 showed EF 50-55%, moderate LV hypertrophy, probably mild mitral stenosis, normal RV size and systolic function.  She was initially on dronedarone to try to maintain NSR, but developed persistent atrial fibrillation. She was changed to amiodarone and underwent cardioversion to NSR in 4/17 but went back into atrial fibrillation.  The decision was made to pursue rate control and her amio was stopped.  She also has a history of chronic diastolic CHF triggered by afib in the past.  She was changed from warfarin to Eliquis.  She was seen by Angela Favre, PA on 08/12/2022 and was doing well with very rare palpitations which felt like skipped beats.  She was maintaining A-fib with controlled heart rate that day.  She had had some leg swelling that occurred around the time of a prednisone treatment after developing allergic reaction to an antibiotic for cellulitis of her  leg.  She is here today for followup and is doing well.  She has chronic DOE that she thinks is pretty stable.  She denies any chest pain or pressure, PND, orthopnea, dizziness, palpitations or syncope. She still has chronic LE edema that is worse with standing too long.  She is going to get some compression hose. She is compliant with her meds and is tolerating meds with no SE.     Past Medical History:  Diagnosis Date   ABDOMINAL PAIN RIGHT UPPER QUADRANT 11/08/2008   ALLERGIC RHINITIS 08/01/2009   Anxiety    Aortic stenosis    mild by echo 10/2021   Cataract    Complication of anesthesia 01/31/2015   ? doesn't remember anything after being taken to pre surgery   Coronary artery calcification seen on CAT scan 05/30/2009   moderate risk coronary calcium score by chest CT with mid LAD plaque>>patient opted for medical management   DCM (dilated cardiomyopathy) (HCC)    EF 45-50% by echo 8/23   DDD (degenerative disc disease), lumbar    DEPRESSION 06/13/2008   DYSLIPIDEMIA 02/27/2009   DYSPNEA 02/27/2009   Dysrhythmia    Afib   Frequency of urination    GERD (gastroesophageal reflux disease)    "years ago"   Head injury 2016   did not loose consciousness   History of Bell's palsy    History of DVT of lower extremity 2006-- CHRONIC COUMADIN THERAPY   History of hiatal hernia    History of Lyme disease  Hydronephrosis, 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 - [place]- "you have had 3 strokes"    Past Surgical History:  Procedure Laterality Date   BREAST MASS EXCISION     BENIGN-- RIGHT BREAST    CARDIOVASCULAR STRESS TEST  04-04-2009-- PER PT ASYMPTOMATIC-- MEDICAL MANAGEMENT   STUDY WAS EQUIVOCAL/ EF 49%/ GLOBAL HYPOKINESIS/ APPEARS TO BE A MILD ANTERIOR PERFUSION DEFECT COULD REPRESENT ISCHEMIA BUT DUE TO  ACTIVITY WORSE IN STRESS THAN AT REST POSS. THE DEFECT WAS ARTIFACTUAL   CARDIOVERSION N/A 07/11/2015   Procedure: CARDIOVERSION;  Surgeon: Laurey Morale, MD;  Location: [facility] ENDOSCOPY;  Service: Cardiovascular;  Laterality: N/A;   CT ANGIOGRAM  FEB 2011   CALCIUM SCORE 161/ POSS. MODERATE MID LAD STENOSIS   CYSTOSCOPY W/ RETROGRADES  04/28/2011   Procedure: CYSTOSCOPY WITH RETROGRADE PYELOGRAM;  Surgeon: Antony Haste, MD;  Location: [facility];  Service: Urology;  Laterality: Left;   CYSTOSCOPY W/ URETERAL STENT REMOVAL  04/28/2011   Procedure: CYSTOSCOPY WITH STENT REMOVAL;  Surgeon: Antony Haste, MD;  Location: [facility];  Service: Urology;;   EXPLORATORY LAPAROTOMY  2005   LYMPHOMA   EYE SURGERY Bilateral    cataract    GAS INSERTION Right 03/31/2018   Procedure: RIGHT EYE INSERTION OF GAS C3F8;  Surgeon: Rennis Chris, MD;  Location: [facility] OR;  Service: Ophthalmology;  Laterality: Right;   KNEE ARTHROSCOPY  2005   RIGHT   LASER PHOTO ABLATION Right 03/31/2018   Procedure: RIGHT EYE LASER PHOTO ABLATION;  Surgeon: Rennis Chris, MD;  Location: [facility] OR;  Service: Ophthalmology;  Laterality: Right;   MULTIPLE CYSTO/ LEFT URETERAL STENT EXCHANGES  LAST ONE 10-28-10   RETROPERITONEAL BX  2007   REVERSE SHOULDER ARTHROPLASTY Right 01/31/2015   Procedure: RIGHT REVERSE SHOULDER ARTHROPLASTY;  Surgeon: Francena Hanly, MD;  Location: [facility] OR;  Service: Orthopedics;  Laterality: Right;   TONSILLECTOMY  CHILD   TOTAL KNEE ARTHROPLASTY  OCT 2010   RIGHT   TOTAL KNEE ARTHROPLASTY  JAN 2010   LEFT   TRANSTHORACIC ECHOCARDIOGRAM  05-03-2009   MILD SYSTOLIC DYSFUNCTION/ EF 45-50%/ MODERATE DIASTOLIC DYSFUCTION/ MILD MR/ MILD BIATRIAL ENLARGEMENT    VITRECTOMY 25 GAUGE WITH SCLERAL BUCKLE Right 03/31/2018   Procedure: RIGHT VITRECTOMY 25 GAUGE WITH SCLERAL BUCKLE;  Surgeon: Rennis Chris, MD;  Location: [facility] OR;  Service: Ophthalmology;  Laterality: Right;    Current Medications: Current Meds  Medication Sig   ALPRAZolam (XANAX) 1 MG tablet Take 1 tablet (1 mg total) by mouth 3 (three) times daily as needed.   apixaban (ELIQUIS) 5 MG TABS tablet Take 1 tablet (5 mg total) by mouth 2 (two) times daily.   b complex vitamins capsule Take 1 capsule by mouth daily.   buPROPion (WELLBUTRIN XL) 300 MG 24 hr tablet Take 1 tablet (300 mg total) by mouth daily.   chlorhexidine (PERIDEX) 0.12 % solution USE AS DIRECTED. EXPECTORATE - DO NOT SWALLOW   Cholecalciferol (VITAMIN D) 2000 UNITS CAPS Take 2,000 Int'l Units by mouth.   Coenzyme  Q10-Fish Oil-Vit E (CO-Q 10 OMEGA-3 FISH OIL PO) Take 100 mg by mouth daily.    diltiazem (CARDIZEM CD) 120 MG 24 hr capsule Take 1 capsule (120 mg total) by mouth daily.   FLUAD QUADRIVALENT 0.5 ML injection    furosemide (LASIX) 20 MG tablet Take 1 tablet (20 mg total) by mouth daily.   gentamicin cream (GARAMYCIN) 0.1 % Apply to affected toes once daily.   lidocaine (XYLOCAINE) 5 % ointment Apply 1 Application topically as needed.   Lysine 500 MG TABS Take 500 mg by mouth daily.   metoprolol succinate (TOPROL-XL) 25 MG 24 hr tablet Take 1 tablet (25 mg total) by mouth daily.   pantoprazole (PROTONIX) 40 MG tablet TAKE 1 TABLET BY MOUTH EVERY DAY   Pitavastatin Calcium (LIVALO) 2 MG TABS Take 1 tablet (2 mg total) by mouth daily.   potassium chloride (KLOR-CON) 20 MEQ packet Take 20 mEq by mouth daily.   valACYclovir (VALTREX) 1000 MG tablet TAKE 2 TABLETS AT ONSET OF COLD SORES AND REPEAT 2 IN 12 HOURS.     Allergies:   Chlorhexidine, Chlorhexidine base, Clindamycin/lincomycin, Sulfa antibiotics, and Penicillins   Social History   Socioeconomic History   Marital status: Married    Spouse name: Not on file    Number of children: Not on file   Years of education: Not on file   Highest education level: Not on file  Occupational History   Not on file  Tobacco Use   Smoking status: Former    Current packs/day: 0.00    Average packs/day: 2.0 packs/day for 52.0 years (104.0 ttl pk-yrs)    Types: Cigarettes    Start date: 03/24/1956    Quit date: 03/24/2008    Years since quitting: 14.5   Smokeless tobacco: Never  Vaping Use   Vaping status: Never Used  Substance and Sexual Activity   Alcohol use: Not Currently    Comment: RARE   Drug use: No   Sexual activity: Not Currently  Other Topics Concern   Not on file  Social History Narrative   Not on file   Social Determinants of Health   Financial Resource Strain: Low Risk  (07/13/2022)   Overall Financial Resource Strain (CARDIA)    Difficulty of Paying Living Expenses: Not hard at all  Food Insecurity: No Food Insecurity (07/13/2022)   Hunger Vital Sign    Worried About Running Out of Food in the Last Year: Never true    Ran Out of Food in the Last Year: Never true  Transportation Needs: No Transportation Needs (07/13/2022)   PRAPARE - Administrator, Civil Service (Medical): No    Lack of Transportation (Non-Medical): No  Physical Activity: Inactive (07/13/2022)   Exercise Vital Sign    Days of Exercise per Week: 0 days    Minutes of Exercise per Session: 0 min  Stress: No Stress Concern Present (07/13/2022)   [employer] of Occupational Health - Occupational Stress Questionnaire    Feeling of Stress : Not at all  Social Connections: Socially Isolated (07/13/2022)   Social Connection and Isolation Panel [NHANES]    Frequency of Communication with Friends and Family: More than three times a week    Frequency of Social Gatherings with Friends and Family: More than three times a week    Attends Religious Services: Never    Database administrator or Organizations: No    Attends Banker Meetings: Never    Marital  Status:  Widowed     Family History: The patient's family history includes Arthritis in her mother; Diabetes in her sister; Heart attack in her mother and sister; Heart disease in her mother; Hypertension in her sister; Liver disease in her son; Stroke in her sister. There is no history of Colon cancer, Stomach cancer, Pancreatic cancer, Esophageal cancer, or Rectal cancer.  ROS:   Please see the history of present illness.    Review of Systems  Musculoskeletal:  Negative for muscle cramps.    All other systems reviewed and negative.   EKGs/Labs/Other Studies Reviewed:    The following studies were reviewed today: none  Recent Labs: 10/20/2021: TSH 1.75 01/21/2022: ALT 16 09/22/2022: BUN 19; Creatinine, Ser 1.16; Potassium 5.0; Sodium 144   Recent Lipid Panel    Component Value Date/Time   CHOL 136 10/23/2021 0830   TRIG 95 10/23/2021 0830   HDL 56 10/23/2021 0830   CHOLHDL 2.4 10/23/2021 0830   CHOLHDL 2 10/20/2021 1602   VLDL 21.2 10/20/2021 1602   LDLCALC 62 10/23/2021 0830    Physical Exam:    VS:  BP (!) 130/92 (BP Location: Right Arm, Patient Position: Sitting, Cuff Size: Large)   Pulse 75   Ht 5\' 6"  (1.676 m)   Wt 178 lb 12.8 oz (81.1 kg)   SpO2 99%   BMI 28.86 kg/m     Wt Readings from Last 3 Encounters:  10/19/22 178 lb 12.8 oz (81.1 kg)  08/12/22 176 lb (79.8 kg)  07/23/22 175 lb 3.2 oz (79.5 kg)    GEN: Well nourished, well developed in no acute distress HEENT: Normal NECK: No JVD; No carotid bruits LYMPHATICS: No lymphadenopathy CARDIAC:RRR, no murmurs, rubs, gallops RESPIRATORY:  Clear to auscultation without rales, wheezing or rhonchi  ABDOMEN: Soft, non-tender, non-distended MUSCULOSKELETAL: trace  edema; No deformity  SKIN: Warm and dry NEUROLOGIC:  Alert and oriented x 3 PSYCHIATRIC:  Normal affect  ASSESSMENT:    1. Coronary artery calcification seen on CAT scan   2. Dilated cardiomyopathy (HCC)   3. Permanent atrial fibrillation (HCC)    4. Nonrheumatic aortic valve stenosis   5. Essential hypertension   6. Chronic diastolic CHF (congestive heart failure) (HCC)   7. Pure hypercholesterolemia     PLAN:    In order of problems listed above:  1.  Coronary artery calcification seen on CAT scan -She had a coronary calcium score of 161 in 2011 in the mid LAD -She denies any anginal symptoms -Continue drug management with Toprol-XL 25 mg daily and Livalo 2 mg daily with as needed refills -No aspirin due to DOAC   2.  Dilated cardiomyopathy  -EF was 50-55% by echo 2018  -EF was 45-50% by echo 2023 -EF was 50% by echo today with severe BAE , normal RVF with enlarged RV -repeat echo in 1 year -Continue prescription drug management with Toprol-XL 25 mg daily   3.  Permanent atrial fibrillation  -She remains in show fibrillation with controlled ventricular response and denies any significant palpitations -She has not had any bleeding problems on DOAC -Continue prescription drug management with apixaban 5 mg twice daily, Toprol-XL 25 mg daily and Cardizem CD 120 mg daily with as needed refills -I have personally reviewed and interpreted outside labs performed by patient's PCP which showed serum creatinine 1.16 potassium 5 on 09/22/2022 -Check CBC   4.  Aortic stenosis  -Read out as mild but likely low-flow low gradient moderate AS based on echo 10/23/2021 with echo  8/23 with mean AVG Aortic valve calculated at 1 cm with mean gradient 7 mmHg but SVI was low at 17 and DVI 0.28. -repeat echo today with lo flow low gradient moderate AS with mean AVG 9,mmHg, DVI 0.32, AVA 1.28cm2 -repeat echo in 1 year   5.  Hypertension  -BP is borderline controlled on exam today -Continue prescription drug management with Lasix 20 mg daily, Toprol-XL 25 mg daily with as needed refills  6.  Chronic combined systolic/diastolic CHF/Chronic LE edema -EF today 50% by echo -She does not appear volume overloaded on exam today -she does have  problems with intermittent LE edema if she stands too long -she has chronic DOE which is stable on diuretics -has not been able to tolerate the SGLT2i or ARB/ARNI/MRA due to soft BP but EF is now low normal so will hold off starting any of them -Continue prescription drug management with Lasix 20 mg daily and Toprol-XL 25 mg daily with as needed refills -she is getting compression hose to wear  7.  Hyperlipidemia  -LDL goal is less than 70.   -Check FLP and ALT -Continue prescription drug management with Livalo 2 mg daily  Followup 1 year  Medication Adjustments/Labs and Tests Ordered: Current medicines are reviewed at length with the patient today.  Concerns regarding medicines are outlined above.  No orders of the defined types were placed in this encounter.  No orders of the defined types were placed in this encounter.   Signed, Angela Magic, MD  10/19/2022 3:28 PM    [facility]

## 2022-10-19 NOTE — Patient Instructions (Signed)
Medication Instructions:  Your physician recommends that you continue on your current medications as directed. Please refer to the Current Medication list given to you today.  *If you need a refill on your cardiac medications before your next appointment, please call your pharmacy*   Lab Work: Please complete a CBC, CMET, and FLP in our lab today before you leave.  If you have labs (blood work) drawn today and your tests are completely normal, you will receive your results only by: MyChart Message (if you have MyChart) OR A paper copy in the mail If you have any lab test that is abnormal or we need to change your treatment, we will call you to review the results.   Testing/Procedures: Your physician has requested that you have an echocardiogram in one year (July 2025). Echocardiography is a painless test that uses sound waves to create images of your heart. It provides your doctor with information about the size and shape of your heart and how well your heart's chambers and valves are working. This procedure takes approximately one hour. There are no restrictions for this procedure. Please do NOT wear cologne, perfume, aftershave, or lotions (deodorant is allowed). Please arrive 15 minutes prior to your appointment time.    Follow-Up: At [facility], you and your health needs are our priority.  As part of our continuing mission to provide you with exceptional heart care, we have created designated Provider Care Teams.  These Care Teams include your primary Cardiologist (physician) and Advanced Practice Providers (APPs -  Physician Assistants and Nurse Practitioners) who all work together to provide you with the care you need, when you need it.  We recommend signing up for the patient portal called "MyChart".  Sign up information is provided on this After Visit Summary.  MyChart is used to connect with patients for Virtual Visits (Telemedicine).  Patients are able to view lab/test  results, encounter notes, upcoming appointments, etc.  Non-urgent messages can be sent to your provider as well.   To learn more about what you can do with MyChart, go to [email or web address].    Your next appointment:   1 year(s)  Provider:   Armanda Magic, MD

## 2022-11-03 DIAGNOSIS — H353132 Nonexudative age-related macular degeneration, bilateral, intermediate dry stage: Secondary | ICD-10-CM | POA: Diagnosis not present

## 2022-11-06 ENCOUNTER — Telehealth: Payer: Self-pay | Admitting: Cardiology

## 2022-11-06 ENCOUNTER — Other Ambulatory Visit: Payer: Self-pay | Admitting: Cardiology

## 2022-11-06 DIAGNOSIS — I4821 Permanent atrial fibrillation: Secondary | ICD-10-CM

## 2022-11-06 NOTE — Telephone Encounter (Signed)
Prescription refill request for Eliquis received. Indication:afib Last office visit:7/24 Scr:1.20  7/24 Age: 77 Weight:81.1  kg  Prescription refilled

## 2022-11-06 NOTE — Telephone Encounter (Signed)
*  STAT* If patient is at the pharmacy, call can be transferred to refill team.   1. Which medications need to be refilled? (please list name of each medication and dose if known) apixaban (ELIQUIS) 5 MG TABS tablet  2. Which pharmacy/location (including street and city if local pharmacy) is medication to be sent to? [id number] - [place], [place] - [address]    3. Do they need a 30 day or 90 day supply?  30 day supply Patient states she will run out of medication during the weekend.

## 2022-12-07 ENCOUNTER — Ambulatory Visit (INDEPENDENT_AMBULATORY_CARE_PROVIDER_SITE_OTHER): Payer: Medicare Other | Admitting: Podiatry

## 2022-12-07 DIAGNOSIS — I739 Peripheral vascular disease, unspecified: Secondary | ICD-10-CM

## 2022-12-07 DIAGNOSIS — L84 Corns and callosities: Secondary | ICD-10-CM | POA: Diagnosis not present

## 2022-12-07 DIAGNOSIS — B351 Tinea unguium: Secondary | ICD-10-CM | POA: Diagnosis not present

## 2022-12-07 DIAGNOSIS — M79676 Pain in unspecified toe(s): Secondary | ICD-10-CM | POA: Diagnosis not present

## 2022-12-07 NOTE — Progress Notes (Unsigned)
Subjective:  Patient ID: [name], female    DOB: 10/07/1945,  MRN: [id number]  Inamae Stream presents to clinic today for:  Chief Complaint  Patient presents with   Hammer Toe    Left 2nd hammer toe with pre ulcerative callus   Patient notes nails are thick and elongated, causing pain in shoe gear when ambulating. She gets painful corns on both feet.    PCP is Burchette, Elberta Fortis, MD.  States that she is seeing PCP next week  Past Medical History:  Diagnosis Date   ABDOMINAL PAIN RIGHT UPPER QUADRANT 11/08/2008   ALLERGIC RHINITIS 08/01/2009   Anxiety    Aortic stenosis    mild by echo 10/2021   Cataract    Complication of anesthesia 01/31/2015   ? doesn't remember anything after being taken to pre surgery   Coronary artery calcification seen on CAT scan 05/30/2009   moderate risk coronary calcium score by chest CT with mid LAD plaque>>patient opted for medical management   DCM (dilated cardiomyopathy) (HCC)    EF 45-50% by echo 8/23   DDD (degenerative disc disease), lumbar    DEPRESSION 06/13/2008   DYSLIPIDEMIA 02/27/2009   DYSPNEA 02/27/2009   Dysrhythmia    Afib   Frequency of urination    GERD (gastroesophageal reflux disease)    "years ago"   Head injury 2016   did not loose consciousness   History of Bell's palsy    History of DVT of lower extremity 2006-- CHRONIC COUMADIN THERAPY   History of hiatal hernia    History of Lyme disease    Hydronephrosis, 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 - [place]- "you have had 3 strokes"    Allergies  Allergen Reactions    Chlorhexidine     Other reaction(s): Not available   Chlorhexidine Base Itching    CHG WIPES   Clindamycin/Lincomycin Other (See Comments)    Caused fire like sensation in her stomach    Sulfa Antibiotics Other (See Comments)    Severe GI upset   Penicillins Hives and Rash    DID THE REACTION INVOLVE: Swelling of the face/tongue/throat, SOB, or low BP? No Sudden or severe rash/hives, skin peeling, or the inside of the mouth or nose? No Did it require medical treatment? No When did it last happen?  90s If all above answers are "NO", may proceed with cephalosporin use.    Objective:  Agusta Chami is a pleasant 77 y.o. female in NAD. AAO x 3.  Vascular Examination: Patient has palpable DP pulse, absent PT pulse bilateral.  Delayed capillary refill bilateral toes.  Sparse digital hair bilateral.  Proximal to distal cooling WNL bilateral.    Dermatological Examination: Interspaces are clear with no open lesions noted bilateral.  Skin is shiny and atrophic bilateral.  Nails are 3-11mm thick, with yellowish/brown discoloration, subungual debris and distal onycholysis x10.  There is pain with compression of nails x10.  There are hyperkeratotic lesions noted on dorsal right 3rd and 4th toes at PIPJ and distal  left 2nd toe (hemorrhagic corn).  Neurological Examination: Epicritic sensation intact b/l LE.   Musculoskeletal Examination: Muscle strength 5/5 to all LE muscle groups b/l. Contracture of lesser toes at PIPJ's  Patient qualifies for at-risk foot care because of PVD, pain in nails .  Assessment/Plan: 1. Pain due to onychomycosis of toenail   2. PVD (peripheral vascular disease) (HCC)   3. Corns     Mycotic nails x10 were sharply debrided with sterile nail nippers and power debriding burr to decrease bulk and length.  Hyperkeratotic lesions x3 were shaved with #312 blade.   Return in about 3 months (around 03/08/2023) for RFC.   Clerance Lav, DPM, FACFAS [facility]     [address] [place], [place] [address]                Office [phone]  Fax [phone]

## 2022-12-24 ENCOUNTER — Other Ambulatory Visit: Payer: Self-pay | Admitting: Family Medicine

## 2022-12-24 ENCOUNTER — Other Ambulatory Visit: Payer: Self-pay | Admitting: Cardiology

## 2022-12-24 DIAGNOSIS — F324 Major depressive disorder, single episode, in partial remission: Secondary | ICD-10-CM

## 2022-12-28 ENCOUNTER — Telehealth: Payer: Self-pay | Admitting: Family Medicine

## 2022-12-28 ENCOUNTER — Other Ambulatory Visit: Payer: Self-pay | Admitting: Family Medicine

## 2022-12-28 MED ORDER — ALPRAZOLAM 1 MG PO TABS: 1.0000 mg | ORAL_TABLET | Freq: Three times a day (TID) | ORAL | 0 refills | Status: DC | PRN

## 2022-12-28 NOTE — Telephone Encounter (Signed)
Patient needs a refill on Xanax - she has 1 day left.  Patient has an appointment on Monday.    [facility] [address]

## 2022-12-29 NOTE — Progress Notes (Unsigned)
12/29/2022  Patient ID: [name], female   DOB: [birth year], 77 y.o.   MRN: [id number]  Pharmacy Quality Measure Review  This patient is appearing on a report for being at risk of failing the adherence measure for cholesterol (statin) medications this calendar year.   Medication: Livalo Last fill date: 12/24/22 for 30 day supply  Insurance report was not up to date. No action needed at this time.   Sherrill Raring, PharmD Clinical Pharmacist [phone]

## 2023-01-04 ENCOUNTER — Ambulatory Visit: Payer: Medicare Other | Admitting: Family Medicine

## 2023-01-13 ENCOUNTER — Encounter: Payer: Self-pay | Admitting: Family Medicine

## 2023-01-13 ENCOUNTER — Ambulatory Visit (INDEPENDENT_AMBULATORY_CARE_PROVIDER_SITE_OTHER): Payer: Medicare Other | Admitting: Family Medicine

## 2023-01-13 VITALS — BP 130/80 | HR 93 | Temp 97.5°F | Ht 66.0 in | Wt 186.6 lb

## 2023-01-13 DIAGNOSIS — Z23 Encounter for immunization: Secondary | ICD-10-CM

## 2023-01-13 DIAGNOSIS — E785 Hyperlipidemia, unspecified: Secondary | ICD-10-CM

## 2023-01-13 DIAGNOSIS — F411 Generalized anxiety disorder: Secondary | ICD-10-CM

## 2023-01-13 DIAGNOSIS — N1831 Chronic kidney disease, stage 3a: Secondary | ICD-10-CM | POA: Diagnosis not present

## 2023-01-13 DIAGNOSIS — E1122 Type 2 diabetes mellitus with diabetic chronic kidney disease: Secondary | ICD-10-CM

## 2023-01-13 DIAGNOSIS — R600 Localized edema: Secondary | ICD-10-CM

## 2023-01-13 LAB — POCT GLYCOSYLATED HEMOGLOBIN (HGB A1C): Hemoglobin A1C: 6.5 % — AB (ref 4.0–5.6)

## 2023-01-13 NOTE — Progress Notes (Signed)
Established Patient Office Visit  Subjective   Patient ID: [name], female    DOB: August 27, 1945  Age: 77 y.o. MRN: [id number]  Chief Complaint  Patient presents with   Edema    Patient complains of left ankle edema, x2 months    HPI   [name] is seen today for medical follow-up.  [name] has history of aortic stenosis, chronic diastolic heart failure, elevated coronary calcium score, dilated cardiomyopathy, remote history of DVT lower extremity, hypertension, atrial fibrillation, osteoarthritis, chronic kidney disease, chronic anxiety, remote history of non-Hodgkin's lymphoma.  Recent issues with left lower extremity edema greater than right.  Particularly has noted worsening past couple months.  Weight is up somewhat today at 186 compared to 178 back in July.  Denies any orthopnea.  Very sedentary.  No dyspnea with day-to-day exertion.  Recent echo in July ejection fraction 50% with no regional wall motion abnormalities.  Severely dilated left atrium and right atrium.  Mild mitral valve regurgitation.  Did have mild to moderate tricuspid valve regurgitation.  Aortic valve showed calcification with some moderate thickening and moderate stenosis.  [name] consistently takes her Eliquis.  Denies any calf pain.  Takes furosemide 20 mg daily.  [name] has had elevated blood sugars in the past.  No recent A1c.  No polyuria or polydipsia.  Question came up in prior note of prior intolerance with SGLT2 medication but patient does not recall trying these medications.  [name] remains on diltiazem and metoprolol.  Her heart rate has been stable.  Denies any recent chest pains.  Longstanding history of chronic anxiety.  Has been on Xanax for years.  We expressed our concerns regarding fall risk and confusion.  He tried several other medications including SSRIs without much benefit.  Remains on statin for hyperlipidemia and had recent lipid panel and CMP through cardiology which was reviewed.  LDL  cholesterol 71.  GFR 46  Past Medical History:  Diagnosis Date   ABDOMINAL PAIN RIGHT UPPER QUADRANT 11/08/2008   ALLERGIC RHINITIS 08/01/2009   Anxiety    Aortic stenosis    mild by echo 10/2021   Cataract    Complication of anesthesia 01/31/2015   ? doesn't remember anything after being taken to pre surgery   Coronary artery calcification seen on CAT scan 05/30/2009   moderate risk coronary calcium score by chest CT with mid LAD plaque>>patient opted for medical management   DCM (dilated cardiomyopathy) (HCC)    EF 45-50% by echo 8/23   DDD (degenerative disc disease), lumbar    DEPRESSION 06/13/2008   DYSLIPIDEMIA 02/27/2009   DYSPNEA 02/27/2009   Dysrhythmia    Afib   Frequency of urination    GERD (gastroesophageal reflux disease)    "years ago"   Head injury 2016   did not loose consciousness   History of Bell's palsy    History of DVT of lower extremity 2006-- CHRONIC COUMADIN THERAPY   History of hiatal hernia    History of Lyme disease    Hydronephrosis, left chronic -- secondary to retroperitoneal fibrosis   Hypertension    Lyme disease    NON-HODGKIN'S LYMPHOMA, HX OF dx  2005--  chemoradiation completed 2006--  no recurrence   onocologist- dr odogwu--    OSTEOARTHRITIS, MODERATE 04/07/2010   Permanent atrial fibrillation (HCC)    On chronic anti-coagulation with warfarin   Pneumonia    x 3 - last time 02/2018   Retroperitoneal fibrosis    Stroke [facility])    ? -  [place]- "you have had 3 strokes"   Past Surgical History:  Procedure Laterality Date   BREAST MASS EXCISION     BENIGN-- RIGHT BREAST   CARDIOVASCULAR STRESS TEST  04-04-2009-- PER PT ASYMPTOMATIC-- MEDICAL MANAGEMENT   STUDY WAS EQUIVOCAL/ EF 49%/ GLOBAL HYPOKINESIS/ APPEARS TO BE A MILD ANTERIOR PERFUSION DEFECT COULD REPRESENT ISCHEMIA BUT DUE TO  ACTIVITY WORSE IN STRESS THAN AT REST POSS. THE DEFECT WAS ARTIFACTUAL   CARDIOVERSION N/A 07/11/2015   Procedure: CARDIOVERSION;  Surgeon: Laurey Morale, MD;  Location: [facility] ENDOSCOPY;  Service: Cardiovascular;  Laterality: N/A;   CT ANGIOGRAM  FEB 2011   CALCIUM SCORE 161/ POSS. MODERATE MID LAD STENOSIS   CYSTOSCOPY W/ RETROGRADES  04/28/2011   Procedure: CYSTOSCOPY WITH RETROGRADE PYELOGRAM;  Surgeon: Antony Haste, MD;  Location: [facility];  Service: Urology;  Laterality: Left;   CYSTOSCOPY W/ URETERAL STENT REMOVAL  04/28/2011   Procedure: CYSTOSCOPY WITH STENT REMOVAL;  Surgeon: Antony Haste, MD;  Location: [facility];  Service: Urology;;   EXPLORATORY LAPAROTOMY  2005   LYMPHOMA   EYE SURGERY Bilateral    cataract    GAS INSERTION Right 03/31/2018   Procedure: RIGHT EYE INSERTION OF GAS C3F8;  Surgeon: Rennis Chris, MD;  Location[facility];  Service: Ophthalmology;  Laterality: Right;   KNEE ARTHROSCOPY  2005   RIGHT   LASER PHOTO ABLATION Right 03/31/2018   Procedure: RIGHT EYE LASER PHOTO ABLATION;  Surgeon: Rennis Chris, MD;  Location: [facility] OR;  Service: Ophthalmology;  Laterality: Right;   MULTIPLE CYSTO/ LEFT URETERAL STENT EXCHANGES  LAST ONE 10-28-10   RETROPERITONEAL BX  2007   REVERSE SHOULDER ARTHROPLASTY Right 01/31/2015   Procedure: RIGHT REVERSE SHOULDER ARTHROPLASTY;  Surgeon: Francena Hanly, MD;  Location: [facility] OR;  Service: Orthopedics;  Laterality: Right;   TONSILLECTOMY  CHILD   TOTAL KNEE ARTHROPLASTY  OCT 2010   RIGHT   TOTAL KNEE ARTHROPLASTY  JAN 2010   LEFT   TRANSTHORACIC ECHOCARDIOGRAM  05-03-2009   MILD SYSTOLIC DYSFUNCTION/ EF 45-50%/ MODERATE DIASTOLIC DYSFUCTION/ MILD MR/ MILD BIATRIAL ENLARGEMENT   VITRECTOMY 25 GAUGE WITH SCLERAL BUCKLE Right 03/31/2018   Procedure: RIGHT VITRECTOMY 25 GAUGE WITH SCLERAL BUCKLE;  Surgeon: Rennis Chris, MD;  Location: [facility] OR;  Service: Ophthalmology;  Laterality: Right;    reports that [name] quit smoking about 14 years ago. Her smoking use included cigarettes. [name] started smoking about 66 years ago. [name] has a 104 pack-year  smoking history. [name] has never used smokeless tobacco. [name] reports that [name] does not currently use alcohol. [name] reports that [name] does not use drugs. family history includes Arthritis in her mother; Diabetes in her sister; Heart attack in her mother and sister; Heart disease in her mother; Hypertension in her sister; Liver disease in her son; Stroke in her sister. Allergies  Allergen Reactions   Chlorhexidine     Other reaction(s): Not available   Chlorhexidine Base Itching    CHG WIPES   Clindamycin/Lincomycin Other (See Comments)    Caused fire like sensation in her stomach    Sulfa Antibiotics Other (See Comments)    Severe GI upset   Penicillins Hives and Rash    DID THE REACTION INVOLVE: Swelling of the face/tongue/throat, SOB, or low BP? No Sudden or severe rash/hives, skin peeling, or the inside of the mouth or nose? No Did it require medical treatment? No When did it last happen?  90s If all above answers  are "NO", may proceed with cephalosporin use.     Review of Systems  Constitutional:  Negative for chills, fever and malaise/fatigue.  Eyes:  Negative for blurred vision.  Respiratory:  Negative for cough and shortness of breath.   Cardiovascular:  Positive for leg swelling. Negative for chest pain, palpitations and orthopnea.  Neurological:  Negative for dizziness, weakness and headaches.      Objective:     BP 130/80 (BP Location: Left Arm, Patient Position: Sitting, Cuff Size: Normal)   Pulse 93   Temp (!) 97.5 F (36.4 C) (Oral)   Ht 5\' 6"  (1.676 m)   Wt 186 lb 9.6 oz (84.6 kg)   SpO2 98%   BMI 30.12 kg/m  BP Readings from Last 3 Encounters:  01/13/23 130/80  10/19/22 (!) 130/92  08/12/22 102/74   Wt Readings from Last 3 Encounters:  01/13/23 186 lb 9.6 oz (84.6 kg)  10/19/22 178 lb 12.8 oz (81.1 kg)  08/12/22 176 lb (79.8 kg)      Physical Exam Vitals reviewed.  Constitutional:      Appearance: [name] is well-developed.  Eyes:     Pupils: Pupils  are equal, round, and reactive to light.  Neck:     Thyroid: No thyromegaly.     Vascular: No JVD.  Cardiovascular:     Rate and Rhythm: Normal rate and regular rhythm.     Heart sounds:     No gallop.  Pulmonary:     Effort: Pulmonary effort is normal. No respiratory distress.     Breath sounds: Normal breath sounds. No wheezing or rales.  Musculoskeletal:     Cervical back: Neck supple.     Comments: [name] has mild edema lower legs left slightly greater than right but no pitting edema in either leg.  No weeping edema.  Hyperpigmentation changes consistent with venous stasis.  [name] has several scattered varicosities lower extremities.  Left calf is nontender to palpation.  Neurological:     Mental Status: [name] is alert.      Results for orders placed or performed in visit on 01/13/23  POC HgB A1c  Result Value Ref Range   Hemoglobin A1C 6.5 (A) 4.0 - 5.6 %   HbA1c POC (<> result, manual entry)     HbA1c, POC (prediabetic range)     HbA1c, POC (controlled diabetic range)      Last CBC Lab Results  Component Value Date   WBC 6.7 10/19/2022   HGB 12.4 10/19/2022   HCT 37.3 10/19/2022   MCV 93 10/19/2022   MCH 30.8 10/19/2022   RDW 12.8 10/19/2022   PLT 176 10/19/2022   Last metabolic panel Lab Results  Component Value Date   GLUCOSE 94 10/19/2022   NA 139 10/19/2022   K 4.5 10/19/2022   CL 102 10/19/2022   CO2 24 10/19/2022   BUN 32 (H) 10/19/2022   CREATININE 1.20 (H) 10/19/2022   EGFR 46 (L) 10/19/2022   CALCIUM 9.6 10/19/2022   PROT 6.5 10/19/2022   ALBUMIN 4.1 10/19/2022   LABGLOB 2.4 10/19/2022   AGRATIO 2.0 10/07/2021   BILITOT 0.2 10/19/2022   ALKPHOS 122 (H) 10/19/2022   AST 19 10/19/2022   ALT 18 10/19/2022   ANIONGAP 13 04/06/2018   Last lipids Lab Results  Component Value Date   CHOL 156 10/19/2022   HDL 69 10/19/2022   LDLCALC 71 10/19/2022   TRIG 87 10/19/2022   CHOLHDL 2.3 10/19/2022   Last hemoglobin A1c Lab Results  Component Value  Date   HGBA1C 6.5 (A) 01/13/2023   Last thyroid functions Lab Results  Component Value Date   TSH 1.75 10/20/2021      The ASCVD Risk score (Arnett DK, et al., 2019) failed to calculate for the following reasons:   The patient has a prior MI or stroke diagnosis    Assessment & Plan:   #1 bilateral leg edema left greater than right.  [name] has some mild asymmetry.  No clinical concern for DVT.  [name] does take Eliquis regularly.  Suspect mostly on the basis of venous stasis and diastolic dysfunction.  Recent echo results as above.  Recommended: -Frequent elevation of legs -Try to increase ambulation with walking is much as possible -Consider knee-high compression garments -Continue Lasix 20 mg daily.  For severe edema can double up to 2 daily along with increased potassium and then drop back to her daily 20 mg. -Recent kidney function and electrolytes per cardiology stable  #2 hyperglycemia.  [name] technically has type 2 diabetes with A1c today 6.5%.  Discussed options.  We could not see clear documentation that [name] has tried SGLT2 in the past.  We did discuss possible medication such as Jardiance especially in view of her dilated cardiomyopathy.  [name] would like to observe for now and reassess in 3 months to assess stability.  #3 chronic atrial fibrillation.  Patient maintained on Cardizem, metoprolol, and Eliquis.  Continue close follow-up with cardiology.  #4 chronic anxiety.  [name] has been on alprazolam for several years. We discussed our concerns multiple times in the past regarding her aging and chronic benzo use.  Discussed our preference for her to try to scale back.  Will refill for 6 months.   Return in about 3 months (around 04/15/2023).    Evelena Peat, MD

## 2023-01-13 NOTE — Patient Instructions (Addendum)
Elevate legs frequently  Try to keep daily sodium < 2,000 mg  Consider knee high compression (20-30)  Walk regularly and when sitting down elevate when possible    A1C today is 6.5%.

## 2023-01-19 ENCOUNTER — Ambulatory Visit: Payer: Medicare Other | Admitting: Cardiology

## 2023-01-26 ENCOUNTER — Other Ambulatory Visit: Payer: Self-pay | Admitting: Family Medicine

## 2023-01-26 ENCOUNTER — Telehealth: Payer: Self-pay | Admitting: Family Medicine

## 2023-01-26 MED ORDER — ALPRAZOLAM 1 MG PO TABS: 1.0000 mg | ORAL_TABLET | Freq: Three times a day (TID) | ORAL | 5 refills | Status: DC | PRN

## 2023-01-26 NOTE — Telephone Encounter (Signed)
Prescription Request  01/26/2023  LOV: 01/13/2023  What is the name of the medication or equipment?  ALPRAZolam Prudy Feeler) 1 MG tablet   Have you contacted your pharmacy to request a refill? Yes   Which pharmacy would you like this sent to?  [id number] - [place], [place] - [address] Phone: [phone]  Fax: [phone]    Pt stated she spoke with dr.Burchette when she was here and he was sending it but was not sent. Patient notified that their request is being sent to the clinical staff for review and that they should receive a response within 2 business days.   Please advise at Mobile [phone] (mobile)

## 2023-02-02 ENCOUNTER — Other Ambulatory Visit: Payer: Self-pay | Admitting: Family Medicine

## 2023-02-15 ENCOUNTER — Other Ambulatory Visit: Payer: Self-pay

## 2023-02-16 MED ORDER — CHLORHEXIDINE GLUCONATE 0.12 % MT SOLN: OROMUCOSAL | 0 refills | Status: DC

## 2023-02-16 NOTE — Telephone Encounter (Signed)
Rx sent 

## 2023-02-26 ENCOUNTER — Other Ambulatory Visit: Payer: Self-pay | Admitting: Family Medicine

## 2023-03-01 ENCOUNTER — Encounter: Payer: Self-pay | Admitting: Podiatry

## 2023-03-01 ENCOUNTER — Ambulatory Visit: Payer: Medicare Other | Admitting: Podiatry

## 2023-03-01 DIAGNOSIS — B351 Tinea unguium: Secondary | ICD-10-CM | POA: Diagnosis not present

## 2023-03-01 DIAGNOSIS — M79675 Pain in left toe(s): Secondary | ICD-10-CM

## 2023-03-01 DIAGNOSIS — M79674 Pain in right toe(s): Secondary | ICD-10-CM

## 2023-03-01 DIAGNOSIS — L84 Corns and callosities: Secondary | ICD-10-CM

## 2023-03-01 DIAGNOSIS — I739 Peripheral vascular disease, unspecified: Secondary | ICD-10-CM | POA: Diagnosis not present

## 2023-03-01 NOTE — Progress Notes (Unsigned)
Subjective:  Patient ID: [name], female    DOB: 22-May-1945,  MRN: [id number]  [name] presents to clinic today for:  Chief Complaint  Patient presents with   Debridement    Trim toenails/calluses-diabetic -    Patient notes nails are thick and elongated, causing pain in shoe gear when ambulating.  Patient has painful corns on the bilateral second toe, right third toe and right fifth metatarsal base.  PCP is Kristian Covey, MD. date last seen was 01/13/2023  Past Medical History:  Diagnosis Date   ABDOMINAL PAIN RIGHT UPPER QUADRANT 11/08/2008   ALLERGIC RHINITIS 08/01/2009   Anxiety    Aortic stenosis    mild by echo 10/2021   Cataract    Complication of anesthesia 01/31/2015   ? doesn't remember anything after being taken to pre surgery   Coronary artery calcification seen on CAT scan 05/30/2009   moderate risk coronary calcium score by chest CT with mid LAD plaque>>patient opted for medical management   DCM (dilated cardiomyopathy) (HCC)    EF 45-50% by echo 8/23   DDD (degenerative disc disease), lumbar    DEPRESSION 06/13/2008   DYSLIPIDEMIA 02/27/2009   DYSPNEA 02/27/2009   Dysrhythmia    Afib   Frequency of urination    GERD (gastroesophageal reflux disease)    "years ago"   Head injury 2016   did not loose consciousness   History of Bell's palsy    History of DVT of lower extremity 2006-- CHRONIC COUMADIN THERAPY   History of hiatal hernia    History of Lyme disease    Hydronephrosis, 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 - [place]- "you have had 3 strokes"     Allergies  Allergen Reactions   Chlorhexidine     Other reaction(s): Not available   Chlorhexidine Base Itching    CHG WIPES   Clindamycin/Lincomycin Other (See Comments)    Caused fire like sensation in her stomach    Sulfa Antibiotics Other (See Comments)    Severe GI upset   Penicillins Hives and Rash    DID THE REACTION INVOLVE: Swelling of the face/tongue/throat, SOB, or low BP? No Sudden or severe rash/hives, skin peeling, or the inside of the mouth or nose? No Did it require medical treatment? No When did it last happen?  90s If all above answers are "NO", may proceed with cephalosporin use.     Objective:  [name] is a pleasant 77 y.o. female in NAD. AAO x 3.  Vascular Examination: Patient has palpable DP pulse, absent PT pulse bilateral.  Delayed capillary refill bilateral toes.  Sparse digital hair bilateral.  Proximal to distal cooling WNL bilateral.    Dermatological Examination: Interspaces are clear with no open lesions noted bilateral.  Skin is shiny and atrophic bilateral.  Nails are 3-47mm thick, with yellowish/brown discoloration, subungual debris and distal onycholysis x10.  There is pain with compression of nails x10.  There are hyperkeratotic lesions noted bilateral distal second toes, dorsal right third toe PIPJ and  right plantar fifth metatarsal base.     Latest Ref Rng & Units 01/13/2023   12:05 PM  Hemoglobin A1C  Hemoglobin-A1c 4.0 - 5.6 % 6.5    Patient qualifies for at-risk foot care because of PVD.  Assessment/Plan: 1. Pain due to onychomycosis of toenails of both feet   2. Corns   3. PVD (peripheral vascular disease) (HCC)    Mycotic nails x10 were sharply debrided with sterile nail nippers and power debriding burr to decrease bulk and length.  Hyperkeratotic lesions x 4 were shaved with #312 blade.  2 lesions are proximal to the DIP joint  Return in about 3 months (around 05/30/2023) for [facility].   Clerance Lav, DPM,  FACFAS [facility]     [address] [place], [place] [address]                Office [phone]  Fax [phone]

## 2023-03-26 ENCOUNTER — Other Ambulatory Visit: Payer: Self-pay | Admitting: Cardiology

## 2023-03-26 DIAGNOSIS — I4821 Permanent atrial fibrillation: Secondary | ICD-10-CM

## 2023-03-26 MED ORDER — APIXABAN 5 MG PO TABS: 5.0000 mg | ORAL_TABLET | Freq: Two times a day (BID) | ORAL | 5 refills | Status: DC

## 2023-03-26 MED ORDER — PITAVASTATIN CALCIUM 2 MG PO TABS: 1.0000 | ORAL_TABLET | Freq: Every day | ORAL | 1 refills | Status: DC

## 2023-03-26 NOTE — Telephone Encounter (Signed)
 Prescription refill request for Eliquis received. Indication:afib Last office visit:7/24 Scr:1.20  7/24 Age: 78 Weight:84.6  kg  Prescription refilled

## 2023-03-26 NOTE — Telephone Encounter (Signed)
*  STAT* If patient is at the pharmacy, call can be transferred to refill team.   1. Which medications need to be refilled? (please list name of each medication and dose if known) apixaban  (ELIQUIS ) 5 MG TABS tablet   Pitavastatin  Calcium  (LIVALO ) 2 MG TABS  2. Which pharmacy/location (including street and city if local pharmacy) is medication to be sent to? [facility] - [address], [place], [place] [address]  3. Do they need a 30 day or 90 day supply?  30 day supply + refills  Patient states she is almost out of Livalo .

## 2023-04-16 ENCOUNTER — Other Ambulatory Visit: Payer: Self-pay | Admitting: Family Medicine

## 2023-04-16 NOTE — Telephone Encounter (Signed)
Copied from CRM [id number]. Topic: Clinical - Medication Refill >> Apr 16, 2023 10:30 AM Ernst Spell wrote: Most Recent Primary Care Visit:  Provider: Kristian Covey  Department: [facility]  Visit Type: OFFICE VISIT  Date: 01/13/2023  Medication: ALPRAZolam Prudy Feeler) 1 MG tablet  Has the patient contacted their pharmacy? Yes USING  A DIFFERENT PHARMACY  Is this the correct pharmacy for this prescription? Yes [facility]  [address] [place], [place] [address]  [phone]  Has the prescription been filled recently? Yes  Is the patient out of the medication? No  Has the patient been seen for an appointment in the last year OR does the patient have an upcoming appointment? Yes  Can we respond through MyChart? No - PT WOULD LIKE A CALLBACK WHEN RX IS SENT.  Agent: Please be advised that Rx refills may take up to 3 business days. We ask that you follow-up with your pharmacy.

## 2023-04-22 ENCOUNTER — Telehealth: Payer: Self-pay

## 2023-04-22 MED ORDER — ALPRAZOLAM 1 MG PO TABS: 1.0000 mg | ORAL_TABLET | Freq: Three times a day (TID) | ORAL | 2 refills | Status: DC | PRN

## 2023-04-22 NOTE — Addendum Note (Signed)
Addended by: Kristian Covey on: 04/22/2023 04:57 PM   Modules accepted: Orders

## 2023-04-22 NOTE — Telephone Encounter (Signed)
Copied from CRM [id number]. Topic: Clinical - Prescription Issue >> Apr 22, 2023 10:39 AM Turkey A wrote: Reason for CRM: Patient called and said that with the Xanax prescription she needs a release sent to [facility] since it is a new pharmacy that she will be using. [facility] informed her nothing has been faxed over. Patient will be out of medication on Saturday-

## 2023-04-22 NOTE — Telephone Encounter (Signed)
This was sent to [facility].  Kristian Covey MD [facility]

## 2023-04-22 NOTE — Telephone Encounter (Signed)
Noted

## 2023-05-10 DIAGNOSIS — Z961 Presence of intraocular lens: Secondary | ICD-10-CM | POA: Diagnosis not present

## 2023-05-10 DIAGNOSIS — H524 Presbyopia: Secondary | ICD-10-CM | POA: Diagnosis not present

## 2023-05-10 DIAGNOSIS — H04123 Dry eye syndrome of bilateral lacrimal glands: Secondary | ICD-10-CM | POA: Diagnosis not present

## 2023-06-01 ENCOUNTER — Encounter: Payer: Self-pay | Admitting: Podiatry

## 2023-06-01 ENCOUNTER — Ambulatory Visit (INDEPENDENT_AMBULATORY_CARE_PROVIDER_SITE_OTHER): Payer: Medicare Other | Admitting: Podiatry

## 2023-06-01 VITALS — Ht 66.0 in | Wt 186.6 lb

## 2023-06-01 DIAGNOSIS — L84 Corns and callosities: Secondary | ICD-10-CM | POA: Diagnosis not present

## 2023-06-01 DIAGNOSIS — M79675 Pain in left toe(s): Secondary | ICD-10-CM | POA: Diagnosis not present

## 2023-06-01 DIAGNOSIS — B351 Tinea unguium: Secondary | ICD-10-CM

## 2023-06-01 DIAGNOSIS — M79674 Pain in right toe(s): Secondary | ICD-10-CM

## 2023-06-01 DIAGNOSIS — I739 Peripheral vascular disease, unspecified: Secondary | ICD-10-CM | POA: Diagnosis not present

## 2023-06-01 NOTE — Progress Notes (Unsigned)
 Subjective:  Patient ID: [name], female    DOB: 1946-01-27,  MRN: [id number]  [name] presents to clinic today for:  Chief Complaint  Patient presents with   Nail Problem    RFC   Patient notes nails are thick and elongated, causing pain in shoe gear when ambulating.  She has corns on the distal aspect of her toes.  She wears crest pads for the corns.    PCP is Kristian Covey, MD.  Past Medical History:  Diagnosis Date   ABDOMINAL PAIN RIGHT UPPER QUADRANT 11/08/2008   ALLERGIC RHINITIS 08/01/2009   Anxiety    Aortic stenosis    mild by echo 10/2021   Cataract    Complication of anesthesia 01/31/2015   ? doesn't remember anything after being taken to pre surgery   Coronary artery calcification seen on CAT scan 05/30/2009   moderate risk coronary calcium score by chest CT with mid LAD plaque>>patient opted for medical management   DCM (dilated cardiomyopathy) (HCC)    EF 45-50% by echo 8/23   DDD (degenerative disc disease), lumbar    DEPRESSION 06/13/2008   DYSLIPIDEMIA 02/27/2009   DYSPNEA 02/27/2009   Dysrhythmia    Afib   Frequency of urination    GERD (gastroesophageal reflux disease)    "years ago"   Head injury 2016   did not loose consciousness   History of Bell's palsy    History of DVT of lower extremity 2006-- CHRONIC COUMADIN THERAPY   History of hiatal hernia    History of Lyme disease    Hydronephrosis, 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 - [place]- "you have had 3 strokes"    Allergies  Allergen Reactions   Chlorhexidine     Other reaction(s):  Not available   Chlorhexidine Base Itching    CHG WIPES   Clindamycin/Lincomycin Other (See Comments)    Caused fire like sensation in her stomach    Sulfa Antibiotics Other (See Comments)    Severe GI upset   Penicillins Hives and Rash    DID THE REACTION INVOLVE: Swelling of the face/tongue/throat, SOB, or low BP? No Sudden or severe rash/hives, skin peeling, or the inside of the mouth or nose? No Did it require medical treatment? No When did it last happen?  90s If all above answers are "NO", may proceed with cephalosporin use.     Objective:  Chantay Whitelock is a pleasant 78 y.o. female in NAD. AAO x 3.  Vascular Examination: Patient has palpable DP pulse, absent PT pulse bilateral.  Delayed capillary refill bilateral toes.  Sparse digital hair bilateral.  Proximal to distal cooling WNL bilateral.    Dermatological Examination: Interspaces are clear with no open lesions noted bilateral.  Skin is shiny and atrophic bilateral.  Nails are 3-30mm thick, with yellowish/brown discoloration, subungual debris and distal onycholysis x10.  There is pain with compression of nails x10.  There are hyperkeratotic lesions noted dorsal right 2nd, 3rd, and 4th toes and left 2nd toe .  Latest Ref Rng & Units 01/13/2023   12:05 PM  Hemoglobin A1C  Hemoglobin-A1c 4.0 - 5.6 % 6.5    Patient qualifies for at-risk foot care because of PVD .  Assessment/Plan: 1. Pain due to onychomycosis of toenails of both feet   2. Corns   3. PVD (peripheral vascular disease) (HCC)    Mycotic nails x10 were sharply debrided with sterile nail nippers and power debriding burr to decrease bulk and length.  Hyperkeratotic lesions x4 were shaved with #312 blade.  These are proximal to the DIPJs.   Return in about 3 months (around 09/01/2023) for RFC.   Clerance Lav, DPM, FACFAS [facility]     [address] [place], [place] [address]                 Office [phone]  Fax [phone]

## 2023-06-09 ENCOUNTER — Other Ambulatory Visit: Payer: Self-pay | Admitting: Family Medicine

## 2023-06-09 DIAGNOSIS — F324 Major depressive disorder, single episode, in partial remission: Secondary | ICD-10-CM

## 2023-07-14 ENCOUNTER — Other Ambulatory Visit: Payer: Self-pay | Admitting: Family Medicine

## 2023-07-19 ENCOUNTER — Ambulatory Visit (INDEPENDENT_AMBULATORY_CARE_PROVIDER_SITE_OTHER): Payer: Medicare Other

## 2023-07-19 VITALS — Ht 66.0 in | Wt 180.0 lb

## 2023-07-19 DIAGNOSIS — Z0000 Encounter for general adult medical examination without abnormal findings: Secondary | ICD-10-CM | POA: Diagnosis not present

## 2023-07-19 NOTE — Patient Instructions (Addendum)
 Ms. Istvan , Thank you for taking time to come for your Medicare Wellness Visit. I appreciate your ongoing commitment to your health goals. Please review the following plan we discussed and let me know if I can assist you in the future.   Referrals/Orders/Follow-Ups/Clinician Recommendations:   This is a list of the screening recommended for you and due dates:  Health Maintenance  Topic Date Due   Complete foot exam   Never done   Eye exam for diabetics  Never done   Yearly kidney health urinalysis for diabetes  Never done   COVID-19 Vaccine (5 - 2024-25 season) 11/22/2022   Hemoglobin A1C  07/14/2023   Yearly kidney function blood test for diabetes  10/19/2023   Flu Shot  10/22/2023   Medicare Annual Wellness Visit  07/18/2024   DTaP/Tdap/Td vaccine (3 - Td or Tdap) 07/26/2026   Pneumonia Vaccine  Completed   DEXA scan (bone density measurement)  Completed   Hepatitis C Screening  Completed   Zoster (Shingles) Vaccine  Completed   HPV Vaccine  Aged Out   Meningitis B Vaccine  Aged Out   Screening for Lung Cancer  Discontinued   Colon Cancer Screening  Discontinued    Advanced directives: (Declined) Advance directive discussed with you today. Even though you declined this today, please call our office should you change your mind, and we can give you the proper paperwork for you to fill out.  Next Medicare Annual Wellness Visit scheduled for next year: Yes

## 2023-07-19 NOTE — Progress Notes (Signed)
 Subjective:   [name] is a 78 y.o. who presents for a Medicare Wellness preventive visit.  Visit Complete: Virtual I connected with  [name] on 07/19/23 by a audio enabled telemedicine application and verified that I am speaking with the correct person using two identifiers.  Patient Location: Home  Provider Location: Home Office  I discussed the limitations of evaluation and management by telemedicine. The patient expressed understanding and agreed to proceed.  Vital Signs: Because this visit was a virtual/telehealth visit, some criteria may be missing or patient reported. Any vitals not documented were not able to be obtained and vitals that have been documented are patient reported.    Persons Participating in Visit: Patient.  AWV Questionnaire: No: Patient Medicare AWV questionnaire was not completed prior to this visit.  Cardiac Risk Factors include: advanced age (>68men, >45 women);hypertension     Objective:    Today's Vitals   07/19/23 1438  Weight: 180 lb (81.6 kg)  Height: 5\' 6"  (1.676 m)   Body mass index is 29.05 kg/m.     07/19/2023    2:48 PM 07/13/2022    2:41 PM 07/09/2021    4:14 PM 03/25/2020    6:01 PM 01/25/2020    1:53 PM 04/06/2018   12:52 PM 03/31/2018   10:28 AM  Advanced Directives  Does Patient Have a Medical Advance Directive? No No Yes No Yes Yes Yes  Type of Surveyor, minerals;Living will  Healthcare Power of [place];Living will Living will;Healthcare Power of [employer] Power of [place];Living will  Does patient want to make changes to medical advance directive?   No - Patient declined  No - Patient declined  No - Patient declined  Copy of Healthcare Power of Attorney in Chart?   No - copy requested  No - copy requested No - copy requested No - copy requested  Would patient like information on creating a medical advance directive? No - Patient declined No - Patient declined  No -  Patient declined       Current Medications (verified) Outpatient Encounter Medications as of 07/19/2023  Medication Sig   ALPRAZolam  (XANAX ) 1 MG tablet TAKE 1 TABLET(1 MG) BY MOUTH THREE TIMES DAILY AS NEEDED   apixaban  (ELIQUIS ) 5 MG TABS tablet Take 1 tablet (5 mg total) by mouth 2 (two) times daily.   b complex vitamins capsule Take 1 capsule by mouth daily.   buPROPion  (WELLBUTRIN  XL) 300 MG 24 hr tablet TAKE 1 TABLET BY MOUTH EVERY DAY   chlorhexidine  (PERIDEX ) 0.12 % solution USE AS DIRECTED. EXPECTORATE - DO NOT SWALLOW   Cholecalciferol  (VITAMIN D ) 2000 UNITS CAPS Take 2,000 Int'l Units by mouth.   Coenzyme Q10-Fish Oil-Vit E (CO-Q 10 OMEGA-3 FISH OIL PO) Take 100 mg by mouth daily.    diltiazem  (CARDIZEM  CD) 120 MG 24 hr capsule Take 1 capsule (120 mg total) by mouth daily.   FLUAD QUADRIVALENT 0.5 ML injection    furosemide  (LASIX ) 20 MG tablet TAKE 1 TABLET BY MOUTH EVERY DAY   gentamicin  cream (GARAMYCIN ) 0.1 % Apply to affected toes once daily.   lidocaine  (XYLOCAINE ) 5 % ointment Apply 1 Application topically as needed.   Lysine 500 MG TABS Take 500 mg by mouth daily.   metoprolol  succinate (TOPROL -XL) 25 MG 24 hr tablet Take 1 tablet (25 mg total) by mouth daily.   pantoprazole  (PROTONIX ) 40 MG tablet TAKE 1 TABLET BY MOUTH EVERY DAY   Pitavastatin  Calcium  (  LIVALO ) 2 MG TABS Take 1 tablet (2 mg total) by mouth daily.   potassium chloride  (KLOR-CON ) 20 MEQ packet Take 20 mEq by mouth daily.   valACYclovir  (VALTREX ) 1000 MG tablet TAKE 2 TABLETS AT ONSET OF COLD SORES AND REPEAT 2 IN 12 HOURS.   No facility-administered encounter medications on file as of 07/19/2023.    Allergies (verified) Chlorhexidine , Chlorhexidine  base, Clindamycin /lincomycin, Sulfa  antibiotics, and Penicillins   History: Past Medical History:  Diagnosis Date   ABDOMINAL PAIN RIGHT UPPER QUADRANT 11/08/2008   ALLERGIC RHINITIS 08/01/2009   Anxiety    Aortic stenosis    mild by echo 10/2021    Cataract    Complication of anesthesia 01/31/2015   ? doesn't remember anything after being taken to pre surgery   Coronary artery calcification seen on CAT scan 05/30/2009   moderate risk coronary calcium  score by chest CT with mid LAD plaque>>patient opted for medical management   DCM (dilated cardiomyopathy) (HCC)    EF 45-50% by echo 8/23   DDD (degenerative disc disease), lumbar    DEPRESSION 06/13/2008   DYSLIPIDEMIA 02/27/2009   DYSPNEA 02/27/2009   Dysrhythmia    Afib   Frequency of urination    GERD (gastroesophageal reflux disease)    "years ago"   Head injury 2016   did not loose consciousness   History of Bell's palsy    History of DVT of lower extremity 2006-- CHRONIC COUMADIN  THERAPY   History of hiatal hernia    History of Lyme disease    Hydronephrosis, 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 - [place]- "you have had 3 strokes"   Past Surgical History:  Procedure Laterality Date   BREAST MASS EXCISION     BENIGN-- RIGHT BREAST   CARDIOVASCULAR STRESS TEST  04-04-2009-- PER PT ASYMPTOMATIC-- MEDICAL MANAGEMENT   STUDY WAS EQUIVOCAL/ EF 49%/ GLOBAL HYPOKINESIS/ APPEARS TO BE A MILD ANTERIOR PERFUSION DEFECT COULD REPRESENT ISCHEMIA BUT DUE TO  ACTIVITY WORSE IN STRESS THAN AT REST POSS. THE DEFECT WAS ARTIFACTUAL   CARDIOVERSION N/A 07/11/2015   Procedure: CARDIOVERSION;  Surgeon: Darlis Eisenmenger, MD;  Location: [facility];  Service: Cardiovascular;  Laterality: N/A;   CT ANGIOGRAM  FEB 2011   CALCIUM  SCORE 161/ POSS. MODERATE MID LAD STENOSIS   CYSTOSCOPY W/ RETROGRADES  04/28/2011    Procedure: CYSTOSCOPY WITH RETROGRADE PYELOGRAM;  Surgeon: Moise Anes, MD;  Location: [facility];  Service: Urology;  Laterality: Left;   CYSTOSCOPY W/ URETERAL STENT REMOVAL  04/28/2011   Procedure: CYSTOSCOPY WITH STENT REMOVAL;  Surgeon: Moise Anes, MD;  Location: [facility];  Service: Urology;;   EXPLORATORY LAPAROTOMY  2005   LYMPHOMA   EYE SURGERY Bilateral    cataract    GAS INSERTION Right 03/31/2018   Procedure: RIGHT EYE INSERTION OF GAS C3F8;  Surgeon: Ronelle Coffee, MD;  Location: [facility] OR;  Service: Ophthalmology;  Laterality: Right;   KNEE ARTHROSCOPY  2005   RIGHT   LASER PHOTO ABLATION Right 03/31/2018   Procedure: RIGHT EYE  LASER PHOTO ABLATION;  Surgeon: Ronelle Coffee, MD;  Location: [facility] OR;  Service: Ophthalmology;  Laterality: Right;   MULTIPLE CYSTO/ LEFT URETERAL STENT EXCHANGES  LAST ONE 10-28-10   RETROPERITONEAL BX  2007   REVERSE SHOULDER ARTHROPLASTY Right 01/31/2015   Procedure: RIGHT REVERSE SHOULDER ARTHROPLASTY;  Surgeon: Ellard Gunning, MD;  Location: [facility] OR;  Service: Orthopedics;  Laterality: Right;   TONSILLECTOMY  CHILD   TOTAL KNEE ARTHROPLASTY  OCT 2010   RIGHT   TOTAL KNEE ARTHROPLASTY  JAN 2010   LEFT   TRANSTHORACIC ECHOCARDIOGRAM  05-03-2009   MILD SYSTOLIC DYSFUNCTION/ EF 45-50%/ MODERATE DIASTOLIC DYSFUCTION/ MILD MR/ MILD BIATRIAL ENLARGEMENT   VITRECTOMY 25 GAUGE WITH SCLERAL BUCKLE Right 03/31/2018   Procedure: RIGHT VITRECTOMY 25 GAUGE WITH SCLERAL BUCKLE;  Surgeon: Ronelle Coffee, MD;  Location: [facility] OR;  Service: Ophthalmology;  Laterality: Right;   Family History  Problem Relation Age of Onset   Arthritis Mother    Heart disease Mother    Heart attack Mother    Diabetes Sister    Hypertension Sister    Stroke Sister    Heart attack Sister    Liver disease Son        r/t Tylenol  use and alcoholism   Colon cancer Neg Hx    Stomach cancer Neg Hx    Pancreatic cancer Neg Hx    Esophageal  cancer Neg Hx    Rectal cancer Neg Hx    Social History   Socioeconomic History   Marital status: Married    Spouse name: Not on file   Number of children: Not on file   Years of education: Not on file   Highest education level: Not on file  Occupational History   Not on file  Tobacco Use   Smoking status: Former    Current packs/day: 0.00    Average packs/day: 2.0 packs/day for 52.0 years (104.0 ttl pk-yrs)    Types: Cigarettes    Start date: 03/24/1956    Quit date: 03/24/2008    Years since quitting: 15.3   Smokeless tobacco: Never  Vaping Use   Vaping status: Never Used  Substance and Sexual Activity   Alcohol use: Not Currently    Comment: RARE   Drug use: No   Sexual activity: Not Currently  Other Topics Concern   Not on file  Social History Narrative   Not on file   Social Drivers of Health   Financial Resource Strain: Low Risk  (07/19/2023)   Overall Financial Resource Strain (CARDIA)    Difficulty of Paying Living Expenses: Not hard at all  Food Insecurity: No Food Insecurity (07/19/2023)   Hunger Vital Sign    Worried About Running Out of Food in the Last Year: Never true    Ran Out of Food in the Last Year: Never true  Transportation Needs: No Transportation Needs (07/19/2023)   PRAPARE - Administrator, Civil Service (Medical): No    Lack of Transportation (Non-Medical): No  Physical Activity: Inactive (07/19/2023)   Exercise Vital Sign    Days of Exercise per Week: 0 days    Minutes of Exercise per Session: 0 min  Stress: No Stress Concern Present (07/19/2023)   [employer] of Occupational Health - Occupational Stress Questionnaire    Feeling of Stress : Not at all  Social Connections: Socially Isolated (07/19/2023)   Social Connection and Isolation Panel [NHANES]    Frequency of Communication with Friends and Family: More than three times  a week    Frequency of Social Gatherings with Friends and Family: More than three times a week     Attends Religious Services: Never    Database administrator or Organizations: No    Attends Banker Meetings: Never    Marital Status: Widowed    Tobacco Counseling Counseling given: Not Answered    Clinical Intake:  Pre-visit preparation completed: Yes  Pain : No/denies pain     BMI - recorded: 29.05 Nutritional Status: BMI 25 -29 Overweight Nutritional Risks: None Diabetes: No  Lab Results  Component Value Date   HGBA1C 6.5 (A) 01/13/2023   HGBA1C 6.7 (A) 04/08/2018   HGBA1C 6.7 (H) 05/31/2015     How often do you need to have someone help you when you read instructions, pamphlets, or other written materials from your doctor or pharmacy?: 1 - Never  Interpreter Needed?: No  Information entered by :: Farris Hong LPN   Activities of Daily Living     07/19/2023    2:46 PM  In your present state of health, do you have any difficulty performing the following activities:  Hearing? 0  Vision? 0  Difficulty concentrating or making decisions? 0  Walking or climbing stairs? 0  Dressing or bathing? 0  Doing errands, shopping? 0  Preparing Food and eating ? N  Using the Toilet? N  In the past six months, have you accidently leaked urine? N  Do you have problems with loss of bowel control? N  Managing your Medications? N  Managing your Finances? N  Housekeeping or managing your Housekeeping? N    Patient Care Team: Marquetta Sit, MD as PCP - General Jacqueline Matsu, MD as PCP - Cardiology (Cardiology) Alver Austin, [facility] (Inactive) as Pharmacist (Pharmacist)  Indicate any recent Medical Services you may have received from other than [facility] providers in the past year (date may be approximate).     Assessment:   This is a routine wellness examination for [place].  Hearing/Vision screen Hearing Screening - Comments:: Denies hearing difficulties   Vision Screening - Comments:: Wears rx glasses - up to date with routine eye exams with  Dr  Ambrosio Junker   Goals Addressed               This Visit's Progress     Remain active (pt-stated)         Depression Screen     07/19/2023    2:42 PM 07/23/2022    4:22 PM 07/13/2022    2:38 PM 10/20/2021    3:26 PM 07/09/2021    4:08 PM 03/25/2020    6:36 PM 01/25/2020    2:00 PM  PHQ 2/9 Scores  PHQ - 2 Score 0 3 0 6 2 4 5   PHQ- 9 Score  4  11 2 6 7     Fall Risk     07/19/2023    2:47 PM 07/23/2022    4:22 PM 07/13/2022    2:40 PM 01/21/2022    3:34 PM 10/20/2021    3:26 PM  Fall Risk   Falls in the past year? 0 0 0 1 1  Number falls in past yr: 0 0 0 1 1  Injury with Fall? 0 0 0 1 0  Risk for fall due to : No Fall Risks No Fall Risks No Fall Risks No Fall Risks No Fall Risks  Follow up Falls prevention discussed;Falls evaluation completed Falls evaluation completed Falls prevention discussed Falls evaluation  completed Falls evaluation completed    MEDICARE RISK AT HOME:  Medicare Risk at Home Any stairs in or around the home?: Yes If so, are there any without handrails?: No Home free of loose throw rugs in walkways, pet beds, electrical cords, etc?: Yes Adequate lighting in your home to reduce risk of falls?: Yes Life alert?: No Use of a cane, walker or w/c?: No Grab bars in the bathroom?: Yes Shower chair or bench in shower?: Yes Elevated toilet seat or a handicapped toilet?: Yes  TIMED UP AND GO:  Was the test performed?  No  Cognitive Function: 6CIT completed        07/19/2023    2:49 PM 07/13/2022    2:41 PM 07/09/2021    4:14 PM  6CIT Screen  What Year? 0 points 0 points 0 points  What month? 0 points 0 points 0 points  What time? 0 points 0 points 0 points  Count back from 20 0 points 0 points 0 points  Months in reverse 0 points 0 points 0 points  Repeat phrase 0 points 0 points 0 points  Total Score 0 points 0 points 0 points    Immunizations Immunization History  Administered Date(s) Administered   Fluad Quad(high Dose 65+) 03/21/2019, 01/31/2020    Fluad Trivalent(High Dose 65+) 01/13/2023   Influenza Split 01/08/2011, 11/30/2011, 04/23/2014   Influenza Whole 04/07/2010   Influenza, High Dose Seasonal PF 01/15/2017, 12/24/2017   Influenza,inj,Quad PF,6+ Mos 11/30/2012, 04/23/2014   Influenza-Unspecified 04/04/2015   PFIZER(Purple Top)SARS-COV-2 Vaccination 05/14/2019, 06/07/2019, 01/09/2020   Pfizer(Comirnaty)Fall Seasonal Vaccine 12 years and older 04/13/2022   Pneumococcal Conjugate-13 05/31/2015   Pneumococcal Polysaccharide-23 01/08/2011   Td 07/25/2016   Tdap 05/22/2012   Zoster Recombinant(Shingrix) 07/16/2016, 09/29/2016    Screening Tests Health Maintenance  Topic Date Due   FOOT EXAM  Never done   OPHTHALMOLOGY EXAM  Never done   Diabetic kidney evaluation - Urine ACR  Never done   COVID-19 Vaccine (5 - 2024-25 season) 11/22/2022   HEMOGLOBIN A1C  07/14/2023   Diabetic kidney evaluation - eGFR measurement  10/19/2023   INFLUENZA VACCINE  10/22/2023   Medicare Annual Wellness (AWV)  07/18/2024   DTaP/Tdap/Td (3 - Td or Tdap) 07/26/2026   Pneumonia Vaccine 26+ Years old  Completed   DEXA SCAN  Completed   Hepatitis C Screening  Completed   Zoster Vaccines- Shingrix  Completed   HPV VACCINES  Aged Out   Meningococcal B Vaccine  Aged Out   Lung Cancer Screening  Discontinued   Colonoscopy  Discontinued    Health Maintenance  Health Maintenance Due  Topic Date Due   FOOT EXAM  Never done   OPHTHALMOLOGY EXAM  Never done   Diabetic kidney evaluation - Urine ACR  Never done   COVID-19 Vaccine (5 - 2024-25 season) 11/22/2022   HEMOGLOBIN A1C  07/14/2023   Health Maintenance Items Addressed: Hemoglobin A1C, Diabetic kidney evaluation and foot exam deferred until next appointment. Ophthalmology appointment pending.  Additional Screening:  Vision Screening: Recommended annual ophthalmology exams for early detection of glaucoma and other disorders of the eye.  Dental Screening: Recommended annual dental exams  for proper oral hygiene  Community Resource Referral / Chronic Care Management: CRR required this visit?  No   CCM required this visit?  No     Plan:     I have personally reviewed and noted the following in the patient's chart:   Medical and social history Use of alcohol, tobacco  or illicit drugs  Current medications and supplements including opioid prescriptions. Patient is not currently taking opioid prescriptions. Functional ability and status Nutritional status Physical activity Advanced directives List of other physicians Hospitalizations, surgeries, and ER visits in previous 12 months Vitals Screenings to include cognitive, depression, and falls Referrals and appointments  In addition, I have reviewed and discussed with patient certain preventive protocols, quality metrics, and best practice recommendations. A written personalized care plan for preventive services as well as general preventive health recommendations were provided to patient.     Dewayne Ford, LPN   1/61/0960   After Visit Summary: (MyChart) Due to this being a telephonic visit, the after visit summary with patients personalized plan was offered to patient via MyChart   Notes: Nothing significant to report at this time.

## 2023-07-21 DIAGNOSIS — N281 Cyst of kidney, acquired: Secondary | ICD-10-CM | POA: Diagnosis not present

## 2023-07-21 DIAGNOSIS — N183 Chronic kidney disease, stage 3 unspecified: Secondary | ICD-10-CM | POA: Diagnosis not present

## 2023-07-28 NOTE — Telephone Encounter (Unsigned)
 Copied from CRM [id number]. Topic: Clinical - Medication Refill >> Jul 28, 2023  2:38 PM Adonis Hoot wrote: Medication: buPROPion  (WELLBUTRIN  XL) 300 MG 24 hr tablet  Has the patient contacted their pharmacy? No (Agent: If no, request that the patient contact the pharmacy for the refill. If patient does not wish to contact the pharmacy document the reason why and proceed with request.) (Agent: If yes, when and what did the pharmacy advise?)    [id number] - [place], [place] - [address] AT [facility] OF [address] & [address] [place] [address] Phone: [phone] Fax: [phone]  Is this the correct pharmacy for this prescription? Yes If no, delete pharmacy and type the correct one.   Has the prescription been filled recently? Yes  Is the patient out of the medication? No(2 pills left)  Has the patient been seen for an appointment in the last year OR does the patient have an upcoming appointment? Yes  Can we respond through MyChart? No  Agent: Please be advised that Rx refills may take up to 3 business days. We ask that you follow-up with your pharmacy.

## 2023-08-31 ENCOUNTER — Other Ambulatory Visit: Payer: Self-pay | Admitting: *Deleted

## 2023-08-31 DIAGNOSIS — I4819 Other persistent atrial fibrillation: Secondary | ICD-10-CM

## 2023-08-31 MED ORDER — METOPROLOL SUCCINATE ER 25 MG PO TB24: 25.0000 mg | ORAL_TABLET | Freq: Every day | ORAL | 3 refills | Status: AC

## 2023-09-06 ENCOUNTER — Other Ambulatory Visit: Payer: Self-pay | Admitting: Cardiology

## 2023-09-06 ENCOUNTER — Ambulatory Visit: Admitting: Podiatry

## 2023-09-06 DIAGNOSIS — B351 Tinea unguium: Secondary | ICD-10-CM

## 2023-09-06 DIAGNOSIS — M79674 Pain in right toe(s): Secondary | ICD-10-CM | POA: Diagnosis not present

## 2023-09-06 DIAGNOSIS — I739 Peripheral vascular disease, unspecified: Secondary | ICD-10-CM

## 2023-09-06 DIAGNOSIS — L84 Corns and callosities: Secondary | ICD-10-CM

## 2023-09-06 DIAGNOSIS — M79675 Pain in left toe(s): Secondary | ICD-10-CM | POA: Diagnosis not present

## 2023-09-06 NOTE — Progress Notes (Signed)
 Subjective:  Patient ID: [name], female    DOB: October 11, 1945,  MRN: [id number]  [name] presents to clinic today for:  Chief Complaint  Patient presents with   Nail Problem    Pt stated that she is here to have her nails trimmed down    Patient notes nails are thick and elongated, causing pain in shoe gear when ambulating.  She has corns on the distal aspect of her toes.  She wears crest pads for the corns.    PCP is Marquetta Sit, MD. last seen 01/13/2023  Past Medical History:  Diagnosis Date   ABDOMINAL PAIN RIGHT UPPER QUADRANT 11/08/2008   ALLERGIC RHINITIS 08/01/2009   Anxiety    Aortic stenosis    mild by echo 10/2021   Cataract    Complication of anesthesia 01/31/2015   ? doesn't remember anything after being taken to pre surgery   Coronary artery calcification seen on CAT scan 05/30/2009   moderate risk coronary calcium  score by chest CT with mid LAD plaque>>patient opted for medical management   DCM (dilated cardiomyopathy) (HCC)    EF 45-50% by echo 8/23   DDD (degenerative disc disease), lumbar    DEPRESSION 06/13/2008   DYSLIPIDEMIA 02/27/2009   DYSPNEA 02/27/2009   Dysrhythmia    Afib   Frequency of urination    GERD (gastroesophageal reflux disease)    years ago   Head injury 2016   did not loose consciousness   History of Bell's palsy    History of DVT of lower extremity 2006-- CHRONIC COUMADIN  THERAPY   History of hiatal hernia    History of Lyme disease    Hydronephrosis, left chronic -- secondary to retroperitoneal fibrosis   Hypertension    Lyme disease    NON-HODGKIN'S LYMPHOMA, HX OF dx  2005--  chemoradiation completed 2006--  no recurrence   onocologist- dr odogwu--    OSTEOARTHRITIS, MODERATE 04/07/2010   Permanent atrial fibrillation (HCC)    On chronic anti-coagulation with warfarin   Pneumonia    x 3 - last time 02/2018   Retroperitoneal fibrosis    Stroke [facility])    ? - [place]- you have had 3 strokes     Allergies  Allergen Reactions   Chlorhexidine      Other reaction(s): Not available   Chlorhexidine  Base Itching    CHG WIPES   Clindamycin /Lincomycin Other (See Comments)    Caused fire like sensation in her stomach    Sulfa  Antibiotics Other (See Comments)    Severe GI upset   Penicillins Hives and Rash    DID THE REACTION INVOLVE: Swelling of the face/tongue/throat, SOB, or low BP? No Sudden or severe rash/hives, skin peeling, or the inside of the mouth or nose? No Did it require medical treatment? No When did it last happen?  90s If all above answers are "NO", may proceed with cephalosporin use.     Objective:  [name] is a pleasant 78 y.o. female in NAD. AAO x 3.  Vascular Examination: Patient has palpable DP pulse, absent PT pulse bilateral.  Delayed capillary refill bilateral toes.  Sparse digital hair bilateral.  Proximal to distal cooling WNL bilateral.    Dermatological Examination: Interspaces are clear with no open lesions noted bilateral.  Skin is shiny and atrophic bilateral.  Nails are 3-76mm thick, with yellowish/brown discoloration, subungual debris and distal onycholysis x10.  There is pain with compression of nails x10.  There are hyperkeratotic lesions  noted dorsal right 2nd, 3rd, and 4th toes and left 2nd toe .     Latest Ref Rng & Units 01/13/2023   12:05 PM  Hemoglobin A1C  Hemoglobin-A1c 4.0 - 5.6 % 6.5    Patient qualifies for at-risk foot care because of PVD .  Assessment/Plan: 1. Pain due to onychomycosis of toenails of both feet   2. Corns   3. PVD (peripheral vascular disease) (HCC)    Mycotic nails x10 were sharply debrided with sterile nail nippers and power debriding burr to decrease bulk and length.  Hyperkeratotic lesions x4 were shaved with #312 blade.  These are proximal to the DIPJs.   Return in about 3 months (around 12/07/2023) for RFC.   Joe Murders, DPM, FACFAS [facility]     [address]  [place], [place] [address]                Office [phone]  Fax [phone]

## 2023-09-20 ENCOUNTER — Other Ambulatory Visit

## 2023-09-21 ENCOUNTER — Encounter (HOSPITAL_BASED_OUTPATIENT_CLINIC_OR_DEPARTMENT_OTHER): Payer: Self-pay | Admitting: Emergency Medicine

## 2023-09-21 ENCOUNTER — Telehealth: Payer: Self-pay | Admitting: Cardiology

## 2023-09-21 ENCOUNTER — Emergency Department (HOSPITAL_BASED_OUTPATIENT_CLINIC_OR_DEPARTMENT_OTHER): Admission: EM | Admit: 2023-09-21 | Discharge: 2023-09-21 | Disposition: A | Discharge status: Home / Self Care

## 2023-09-21 ENCOUNTER — Ambulatory Visit: Payer: Self-pay

## 2023-09-21 ENCOUNTER — Emergency Department (HOSPITAL_BASED_OUTPATIENT_CLINIC_OR_DEPARTMENT_OTHER)

## 2023-09-21 ENCOUNTER — Other Ambulatory Visit: Payer: Self-pay

## 2023-09-21 DIAGNOSIS — R600 Localized edema: Secondary | ICD-10-CM | POA: Insufficient documentation

## 2023-09-21 DIAGNOSIS — M79605 Pain in left leg: Secondary | ICD-10-CM

## 2023-09-21 DIAGNOSIS — M79662 Pain in left lower leg: Secondary | ICD-10-CM | POA: Insufficient documentation

## 2023-09-21 DIAGNOSIS — L539 Erythematous condition, unspecified: Secondary | ICD-10-CM | POA: Diagnosis not present

## 2023-09-21 DIAGNOSIS — Z7901 Long term (current) use of anticoagulants: Secondary | ICD-10-CM | POA: Diagnosis not present

## 2023-09-21 LAB — BASIC METABOLIC PANEL WITH GFR
Anion gap: 13 (ref 5–15)
BUN: 31 mg/dL — ABNORMAL HIGH (ref 8–23)
CO2: 23 mmol/L (ref 22–32)
Calcium: 10 mg/dL (ref 8.9–10.3)
Chloride: 103 mmol/L (ref 98–111)
Creatinine, Ser: 1.23 mg/dL — ABNORMAL HIGH (ref 0.44–1.00)
GFR, Estimated: 44 mL/min — ABNORMAL LOW (ref >60)
Glucose, Bld: 140 mg/dL — ABNORMAL HIGH (ref 70–99)
Potassium: 4.1 mmol/L (ref 3.5–5.1)
Sodium: 140 mmol/L (ref 135–145)

## 2023-09-21 LAB — CBC WITH DIFFERENTIAL/PLATELET
Abs Immature Granulocytes: 0.01 10*3/uL (ref 0.00–0.07)
Basophils Absolute: 0 10*3/uL (ref 0.0–0.1)
Basophils Relative: 1 %
Eosinophils Absolute: 0 10*3/uL (ref 0.0–0.5)
Eosinophils Relative: 1 %
HCT: 36.8 % (ref 36.0–46.0)
Hemoglobin: 12.5 g/dL (ref 12.0–15.0)
Immature Granulocytes: 0 %
Lymphocytes Relative: 34 %
Lymphs Abs: 1.8 10*3/uL (ref 0.7–4.0)
MCH: 30.5 pg (ref 26.0–34.0)
MCHC: 34 g/dL (ref 30.0–36.0)
MCV: 89.8 fL (ref 80.0–100.0)
Monocytes Absolute: 0.5 10*3/uL (ref 0.1–1.0)
Monocytes Relative: 10 %
Neutro Abs: 2.9 10*3/uL (ref 1.7–7.7)
Neutrophils Relative %: 54 %
Platelets: 172 10*3/uL (ref 150–400)
RBC: 4.1 MIL/uL (ref 3.87–5.11)
RDW: 13.8 % (ref 11.5–15.5)
WBC: 5.3 10*3/uL (ref 4.0–10.5)
nRBC: 0 % (ref 0.0–0.2)

## 2023-09-21 NOTE — ED Provider Notes (Signed)
 [place] EMERGENCY DEPARTMENT AT [facility] Provider Note   CSN: [id number] Arrival date & time: 09/21/23  1400     Patient presents with: Leg Pain   [name] is a 78 y.o. female.   This is a 78 year old female presenting emergency department for left leg pain.  Reports she has had intermittent pain in her left leg, primarily at night.  Described as aching.  Notes that it is slightly more swollen than right.  History of blood clots as well as A-fib and is on Eliquis .  No missed doses.  No fevers, chills, chest pain, shortness of breath.  Has no other complaints.   Leg Pain      Prior to Admission medications   Medication Sig Start Date End Date Taking? Authorizing Provider  ALPRAZolam  (XANAX ) 1 MG tablet TAKE 1 TABLET(1 MG) BY MOUTH THREE TIMES DAILY AS NEEDED 07/15/23   Burchette, Wolm ORN, MD  apixaban  (ELIQUIS ) 5 MG TABS tablet Take 1 tablet (5 mg total) by mouth 2 (two) times daily. 03/26/23   Shlomo Wilbert SAUNDERS, MD  b complex vitamins capsule Take 1 capsule by mouth daily.    [provider]  buPROPion  (WELLBUTRIN  XL) 300 MG 24 hr tablet TAKE 1 TABLET BY MOUTH EVERY DAY 06/09/23   Burchette, Wolm ORN, MD  chlorhexidine  (PERIDEX ) 0.12 % solution USE AS DIRECTED. EXPECTORATE - DO NOT SWALLOW 02/16/23   Burchette, Wolm ORN, MD  Cholecalciferol  (VITAMIN D ) 2000 UNITS CAPS Take 2,000 Int'l Units by mouth.    [provider]  Coenzyme Q10-Fish Oil-Vit E (CO-Q 10 OMEGA-3 FISH OIL PO) Take 100 mg by mouth daily.     [provider]  diltiazem  (CARDIZEM  CD) 120 MG 24 hr capsule Take 1 capsule (120 mg total) by mouth daily. 08/26/22   Shlomo Wilbert SAUNDERS, MD  FLUAD QUADRIVALENT 0.5 ML injection  01/31/20   [provider]  furosemide  (LASIX ) 20 MG tablet TAKE 1 TABLET BY MOUTH EVERY DAY 09/06/23   Shlomo Wilbert SAUNDERS, MD  gentamicin  cream (GARAMYCIN ) 0.1 % Apply to affected toes once daily. 09/28/22   McCaughan, Dia D, DPM  lidocaine  (XYLOCAINE ) 5 %  ointment Apply 1 Application topically as needed. 10/09/22   Tobie Franky SQUIBB, DPM  Lysine 500 MG TABS Take 500 mg by mouth daily.    [provider]  metoprolol  succinate (TOPROL -XL) 25 MG 24 hr tablet Take 1 tablet (25 mg total) by mouth daily. 08/31/23   Shlomo Wilbert SAUNDERS, MD  pantoprazole  (PROTONIX ) 40 MG tablet TAKE 1 TABLET BY MOUTH EVERY DAY 02/26/23   Burchette, Wolm ORN, MD  Pitavastatin  Calcium  (LIVALO ) 2 MG TABS Take 1 tablet (2 mg total) by mouth daily. 03/26/23   Shlomo Wilbert SAUNDERS, MD  potassium chloride  (KLOR-CON ) 20 MEQ packet Take 20 mEq by mouth daily. 08/26/22   Shlomo Wilbert SAUNDERS, MD  valACYclovir  (VALTREX ) 1000 MG tablet TAKE 2 TABLETS AT ONSET OF COLD SORES AND REPEAT 2 IN 12 HOURS. 02/02/23   Burchette, Wolm ORN, MD    Allergies: Chlorhexidine , Chlorhexidine  base, Clindamycin /lincomycin, Sulfa  antibiotics, and Penicillins    Review of Systems  Updated Vital Signs BP 119/73 (BP Location: Left Arm)   Pulse (!) 107   Temp 98.5 F (36.9 C)   Resp 16   Ht 5' 6 (1.676 m)   Wt 84.4 kg   SpO2 98%   BMI 30.02 kg/m   Physical Exam Vitals and nursing note reviewed.  Constitutional:      General:  She is not in acute distress.    Appearance: She is obese. She is not toxic-appearing.  HENT:     Nose: Nose normal.     Mouth/Throat:     Mouth: Mucous membranes are moist.   Eyes:     Conjunctiva/sclera: Conjunctivae normal.    Cardiovascular:     Rate and Rhythm: Normal rate.     Pulses: Normal pulses.  Pulmonary:     Effort: Pulmonary effort is normal.  Abdominal:     General: Abdomen is flat.   Musculoskeletal:     Right lower leg: Edema present.     Left lower leg: Edema present.     Comments: Patient does have bilateral lower extremity edema.  Has what appears to be chronic venous stasis.  Limbs are warm and well-perfused.  2+ pulses bilaterally.  Soft compartments.  Neurovascularly intact.   Skin:    Capillary Refill: Capillary refill takes less than 2 seconds.    Neurological:     Mental Status: She is alert and oriented to person, place, and time.   Psychiatric:        Mood and Affect: Mood normal.        Behavior: Behavior normal.     (all labs ordered are listed, but only abnormal results are displayed) Labs Reviewed  BASIC METABOLIC PANEL WITH GFR - Abnormal; Notable for the following components:      Result Value   Glucose, Bld 140 (*)    BUN 31 (*)    Creatinine, Ser 1.23 (*)    GFR, Estimated 44 (*)    All other components within normal limits  CBC WITH DIFFERENTIAL/PLATELET    EKG: None  Radiology: US  Venous Img Lower Unilateral Left Result Date: 09/21/2023 CLINICAL DATA:  Left lower extremity pain, edema and erythema. EXAM: LEFT LOWER EXTREMITY VENOUS DOPPLER ULTRASOUND TECHNIQUE: Gray-scale sonography with graded compression, as well as color Doppler and duplex ultrasound were performed to evaluate the lower extremity deep venous systems from the level of the common femoral vein and including the common femoral, femoral, profunda femoral, popliteal and calf veins including the posterior tibial, peroneal and gastrocnemius veins when visible. The superficial great saphenous vein was also interrogated. Spectral Doppler was utilized to evaluate flow at rest and with distal augmentation maneuvers in the common femoral, femoral and popliteal veins. COMPARISON:  None Available. FINDINGS: Contralateral Common Femoral Vein: Respiratory phasicity is normal and symmetric with the symptomatic side. No evidence of thrombus. Normal compressibility. Common Femoral Vein: No evidence of thrombus. Normal compressibility, respiratory phasicity and response to augmentation. Saphenofemoral Junction: No evidence of thrombus. Normal compressibility and flow on color Doppler imaging. Profunda Femoral Vein: No evidence of thrombus. Normal compressibility and flow on color Doppler imaging. Femoral Vein: No evidence of thrombus. Normal compressibility,  respiratory phasicity and response to augmentation. Popliteal Vein: No evidence of thrombus. Normal compressibility, respiratory phasicity and response to augmentation. Calf Veins: No evidence of thrombus. Normal compressibility and flow on color Doppler imaging. Superficial Great Saphenous Vein: No evidence of thrombus. Normal compressibility. Venous Reflux:  None. Other Findings: No evidence of superficial thrombophlebitis or abnormal fluid collection. IMPRESSION: No evidence of left lower extremity deep venous thrombosis. Electronically Signed   By: Marcey Moan M.D.   On: 09/21/2023 16:30     Procedures   Medications Ordered in the ED - No data to display  Clinical Course as of 09/21/23 1637  Tue Sep 21, 2023  1636 US  Venous Img Lower Unilateral Left  IMPRESSION: No evidence of left lower extremity deep venous thrombosis.   Electronically Signed   By: Marcey Moan M.D.   On: 09/21/2023 16:30   [TY]  1636 CBC with Differential [TY]    Clinical Course User Index [TY] Neysa Caron PARAS, DO                                 Medical Decision Making This is a 78 year old female past medical history significant for A-fib and prior DVTs on Eliquis  presenting to the emergency department for left leg pain and swelling.  She is afebrile nontachycardic hemodynamically stable.  She is on Eliquis  and reports not missing any doses which would make clot unlikely, but not out of the realm of possibility.  Will get ultrasound to evaluate.  Basic screening labs overall reassuring.  No leukocytosis fever or tachycardia to suggest systemic infection.  No significant metabolic derangements.  Baseline renal function.  If ultrasound negative can discharge home to follow-up PCP as her exam with no overt sign of infection, it is warm and well-perfused without signs of critical limb ischemia.  She is able to ambulate it is not causing issues with ADLs.  Update; US  negative. Will discharge in stable  condition.   Amount and/or Complexity of Data Reviewed External Data Reviewed:     Details: On Eliquis  Labs: ordered. Decision-making details documented in ED Course. Radiology:  Decision-making details documented in ED Course.  Risk Decision regarding hospitalization.      Final diagnoses:  None    ED Discharge Orders     None          Neysa Caron PARAS, DO 09/21/23 1637

## 2023-09-21 NOTE — Telephone Encounter (Signed)
 Patient is calling because she thinks she has blood clot in her calf. Patient stated she had a blood clot in her calf years ago and it reminds her of the same feeling. Patient stated she will be going to the ER Room, but she is waiting for her son to come take her. Patient is wondering if it would be best for her to come to our office for test first. Please advise.

## 2023-09-21 NOTE — Discharge Instructions (Signed)
 You do not have a blood clot based on our ultrasound today.  Please follow-up with your primary doctor.  Return immediately for fevers, chills, worsening chest pain, shortness of breath, lightheadedness, passout, severe pain inability to eat or drink due to nausea or vomiting, or you have any new or worsening symptoms that are concerning to you.

## 2023-09-21 NOTE — Telephone Encounter (Signed)
 Patient informed of the message below. Patient advised to proceed to ED for evaluation.

## 2023-09-21 NOTE — Telephone Encounter (Signed)
 Spoke with pt and advised her best plan is for a visit to ED as recommended by her PCP office due to symptoms of calf swelling, tissue is hard and painful. She also reports weeping around ankle.  She states she is taking Eliquis  as prescribed.  Pt verbalizes understanding and agrees with current plan.

## 2023-09-21 NOTE — ED Triage Notes (Signed)
 Left leg, redness and pain, some weeping x 3 weeks Hx of blood clot On eliquis  reports compliance with BID dosing

## 2023-09-21 NOTE — ED Notes (Signed)
 DC paperwork given and verbally understood.

## 2023-09-21 NOTE — Telephone Encounter (Signed)
 FYI Only or Action Required?: FYI only for provider.  Patient was last seen in primary care on 01/13/2023 by Micheal Wolm ORN, MD. Called Nurse Triage reporting Leg Pain. Symptoms began several weeks ago. Interventions attempted: Nothing. Symptoms are: unchanged. PT believes that she has a blood clot in left calf.  Triage Disposition: See HCP Within 4 Hours (Or PCP Triage) - pt will go to ed for evaluation.  Patient/caregiver understands and will follow disposition?: Yes                   Copied from CRM [id number]. Topic: Clinical - Red Word Triage >> Sep 21, 2023 10:25 AM Gennette ORN wrote: Red Word that prompted transfer to Nurse Triage: Patient has a blood clot in her left calf she is aching this started 3 weeks ago it goes and come. Patient is also having dizzniness and it comes and go. Reason for Disposition  [1] Thigh or calf pain AND [2] only 1 side AND [3] present > 1 hour (Exception: Chronic unchanged pain.)  Answer Assessment - Initial Assessment Questions 1. ONSET: When did the pain start?      3 weeks 2. LOCATION: Where is the pain located?      Left calf 3. PAIN: How bad is the pain?    (Scale 1-10; or mild, moderate, severe)   -  MILD (1-3): doesn't interfere with normal activities    -  MODERATE (4-7): interferes with normal activities (e.g., work or school) or awakens from sleep, limping    -  SEVERE (8-10): excruciating pain, unable to do any normal activities, unable to walk     Awareness -  ache 4. WORK OR EXERCISE: Has there been any recent work or exercise that involved this part of the body?      no 5. CAUSE: What do you think is causing the leg pain?     Blood clot 6. OTHER SYMPTOMS: Do you have any other symptoms? (e.g., chest pain, back pain, breathing difficulty, swelling, rash, fever, numbness, weakness)     Dizziness,  Protocols used: Leg Pain-A-AH

## 2023-09-22 ENCOUNTER — Telehealth: Payer: Self-pay

## 2023-09-22 NOTE — Transitions of Care (Post Inpatient/ED Visit) (Signed)
   09/22/2023  Name: [name] MRN: [id number] DOB: Oct 31, 1945  Today's TOC FU Call Status: Today's TOC FU Call Status:: Unsuccessful Call (1st Attempt) Unsuccessful Call (1st Attempt) Date: 09/22/23  Attempted to reach the patient regarding the most recent Inpatient/ED visit.  Follow Up Plan: Additional outreach attempts will be made to reach the patient to complete the Transitions of Care (Post Inpatient/ED visit) call.   Signature: Kapri Nero A.Tijuana Scheidegger CMA 2:04 pm

## 2023-09-23 ENCOUNTER — Telehealth: Payer: Self-pay

## 2023-09-23 DIAGNOSIS — F324 Major depressive disorder, single episode, in partial remission: Secondary | ICD-10-CM

## 2023-09-23 MED ORDER — BUPROPION HCL ER (XL) 300 MG PO TB24: 300.0000 mg | ORAL_TABLET | Freq: Every day | ORAL | 0 refills | Status: DC

## 2023-09-23 NOTE — Transitions of Care (Post Inpatient/ED Visit) (Signed)
 09/23/2023  Name: [name] MRN: [id number] DOB: 1945/05/08  Today's TOC FU Call Status: Today's TOC FU Call Status:: Successful TOC FU Call Completed TOC FU Call Complete Date: 09/23/23 Patient's Name and Date of Birth confirmed.  Transition Care Management Follow-up Telephone Call Date of Discharge: 09/21/23 Discharge Facility: [facility] ([facility]) Type of Discharge: Emergency Department How have you been since you were released from the hospital?: Better Any questions or concerns?: No  Items Reviewed: Did you receive and understand the discharge instructions provided?: Yes Medications obtained,verified, and reconciled?: Yes (Medications Reviewed) Any new allergies since your discharge?: No Dietary orders reviewed?: No Do you have support at home?: Yes People in Home [RPT]: child(ren), adult  Medications Reviewed Today: Medications Reviewed Today     Reviewed by Tosha Belgarde, CMA (Certified Medical Assistant) on 09/23/23 at 1439  Med List Status: <None>   Medication Order Taking? Sig Documenting Provider Last Dose Status Informant  ALPRAZolam  (XANAX ) 1 MG tablet [id number] Yes TAKE 1 TABLET(1 MG) BY MOUTH THREE TIMES DAILY AS NEEDED Burchette, Wolm ORN, MD  Active   apixaban  (ELIQUIS ) 5 MG TABS tablet [id number] Yes Take 1 tablet (5 mg total) by mouth 2 (two) times daily. Shlomo Wilbert SAUNDERS, MD  Active   b complex vitamins capsule [id number]  Take 1 capsule by mouth daily.  Patient not taking: Reported on 09/23/2023   [provider]  Active Self           Med Note FELIPE, MELISSA B   Thu Jan 24, 2015  1:28 PM)    buPROPion  (WELLBUTRIN  XL) 300 MG 24 hr tablet [id number] Yes TAKE 1 TABLET BY MOUTH EVERY DAY Burchette, Wolm ORN, MD  Active   chlorhexidine  (PERIDEX ) 0.12 % solution [id number]  USE AS DIRECTED. EXPECTORATE - DO NOT SWALLOW Burchette, Wolm ORN, MD  Active   Cholecalciferol  (VITAMIN D ) 2000 UNITS CAPS [id number] Yes Take 2,000 Int'l Units by mouth.  [provider]  Active Self           Med Note FELIPE, MELISSA B   Thu Jan 24, 2015  1:28 PM)    Coenzyme Q10-Fish Oil-Vit E (CO-Q 10 OMEGA-3 FISH OIL PO) [id number]  Take 100 mg by mouth daily.   Patient not taking: Reported on 09/23/2023   [provider]  Active Self  diltiazem  (CARDIZEM  CD) 120 MG 24 hr capsule [id number] Yes Take 1 capsule (120 mg total) by mouth daily. Shlomo Wilbert SAUNDERS, MD  Active   FLUAD QUADRIVALENT 0.5 ML injection [id number]   [provider]  Active   furosemide  (LASIX ) 20 MG tablet [id number] Yes TAKE 1 TABLET BY MOUTH EVERY DAY Turner, Traci R, MD  Active   gentamicin  cream (GARAMYCIN ) 0.1 % [id number]  Apply to affected toes once daily.  Patient not taking: Reported on 09/23/2023   McCaughan, Dia D, DPM  Active   lidocaine  (XYLOCAINE ) 5 % ointment [id number] Yes Apply 1 Application topically as needed. Tobie Franky SQUIBB, DPM  Active   Lysine 500 MG TABS [id number] Yes Take 500 mg by mouth daily. [provider]  Active Self  metoprolol  succinate (TOPROL -XL) 25 MG 24 hr tablet [id number] Yes Take 1 tablet (25 mg total) by mouth daily. Shlomo Wilbert SAUNDERS, MD  Active   pantoprazole  (PROTONIX ) 40 MG tablet [id number] Yes TAKE 1 TABLET BY MOUTH EVERY DAY Burchette, Wolm ORN, MD  Active   Pitavastatin  Calcium  (LIVALO ) 2 MG TABS [id number] Yes Take 1 tablet (2 mg total) by mouth daily.  Shlomo Wilbert SAUNDERS, MD  Active   potassium chloride  (KLOR-CON ) 20 MEQ packet [id number] Yes Take 20 mEq by mouth daily. Shlomo Wilbert SAUNDERS, MD  Active   valACYclovir  (VALTREX ) 1000 MG tablet [id number] Yes TAKE 2 TABLETS AT ONSET OF COLD SORES AND REPEAT 2 IN 12 HOURS. Micheal Wolm ORN, MD  Active             Home Care and Equipment/Supplies:    Functional Questionnaire:    Follow up appointments reviewed: PCP Follow-up appointment confirmed?: Yes Date of PCP follow-up appointment?: 09/29/23 Follow-up Provider: Dr. Micheal [place]: Willeen Craver, CMA

## 2023-09-29 ENCOUNTER — Inpatient Hospital Stay: Admitting: Family Medicine

## 2023-09-30 ENCOUNTER — Encounter: Payer: Self-pay | Admitting: Family Medicine

## 2023-10-04 ENCOUNTER — Encounter: Payer: Self-pay | Admitting: Family Medicine

## 2023-10-04 ENCOUNTER — Other Ambulatory Visit: Payer: Self-pay

## 2023-10-04 ENCOUNTER — Ambulatory Visit (INDEPENDENT_AMBULATORY_CARE_PROVIDER_SITE_OTHER): Admitting: Family Medicine

## 2023-10-04 VITALS — BP 110/70 | HR 75 | Temp 98.0°F | Wt 191.9 lb

## 2023-10-04 DIAGNOSIS — N1831 Chronic kidney disease, stage 3a: Secondary | ICD-10-CM

## 2023-10-04 DIAGNOSIS — I10 Essential (primary) hypertension: Secondary | ICD-10-CM | POA: Diagnosis not present

## 2023-10-04 DIAGNOSIS — E1122 Type 2 diabetes mellitus with diabetic chronic kidney disease: Secondary | ICD-10-CM | POA: Diagnosis not present

## 2023-10-04 DIAGNOSIS — I878 Other specified disorders of veins: Secondary | ICD-10-CM

## 2023-10-04 MED ORDER — ALPRAZOLAM 1 MG PO TABS: 1.0000 mg | ORAL_TABLET | Freq: Three times a day (TID) | ORAL | 2 refills | Status: DC

## 2023-10-04 NOTE — Progress Notes (Signed)
 Established Patient Office Visit  Subjective   Patient ID: [name], female    DOB: 1945/03/31  Age: 78 y.o. MRN: [id number]  Chief Complaint  Patient presents with   Hospitalization Follow-up    HPI   [name] is seen for hospital follow-up and medical follow-up.  She has multiple chronic problems including history of diastolic heart failure, history of DVT lower extremity, hypertension, atrial fibrillation, remote history of non-Hodgkin lymphoma, type 2 diabetes.  Presented to the ER on 1 July with left leg pain and some swelling.  She described this as a achy pain.  She was concerned because of her history of DVT although she does take Eliquis  regularly.  No recent reported missed doses.  No erythema or cellulitis changes.  Denied any injury.  Does have some known venous stasis bilaterally.  Venous Doppler showed no evidence for DVT.  She had labs with basic metabolic panel and CBC.  White count normal.  Kidney function stable.  Glucose 140.  Has had some recent leg cramps intermittently.  She knows importance of compression and is looking at trying to find a medical supplier for knee-high compression garments that she can zip up.  Does take Lasix  20 mg daily.  No recent chest pains or pleuritic pain.  Past Medical History:  Diagnosis Date   ABDOMINAL PAIN RIGHT UPPER QUADRANT 11/08/2008   ALLERGIC RHINITIS 08/01/2009   Anxiety    Aortic stenosis    mild by echo 10/2021   Cataract    Complication of anesthesia 01/31/2015   ? doesn't remember anything after being taken to pre surgery   Coronary artery calcification seen on CAT scan 05/30/2009   moderate risk coronary calcium  score by chest CT with mid LAD plaque>>patient opted for medical management   DCM (dilated cardiomyopathy) (HCC)    EF 45-50% by echo 8/23   DDD (degenerative disc disease), lumbar    DEPRESSION 06/13/2008   DYSLIPIDEMIA 02/27/2009   DYSPNEA 02/27/2009   Dysrhythmia    Afib   Frequency of  urination    GERD (gastroesophageal reflux disease)    years ago   Head injury 2016   did not loose consciousness   History of Bell's palsy    History of DVT of lower extremity 2006-- CHRONIC COUMADIN  THERAPY   History of hiatal hernia    History of Lyme disease    Hydronephrosis, 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 - [place]- you have had 3 strokes   Past Surgical History:  Procedure Laterality Date   BREAST MASS EXCISION     BENIGN-- RIGHT BREAST   CARDIOVASCULAR STRESS TEST  04-04-2009-- PER PT ASYMPTOMATIC-- MEDICAL MANAGEMENT   STUDY WAS EQUIVOCAL/ EF 49%/ GLOBAL HYPOKINESIS/ APPEARS TO BE A MILD ANTERIOR PERFUSION DEFECT COULD REPRESENT ISCHEMIA BUT DUE TO  ACTIVITY WORSE IN STRESS THAN AT REST POSS. THE DEFECT WAS ARTIFACTUAL   CARDIOVERSION N/A 07/11/2015   Procedure: CARDIOVERSION;  Surgeon: Angela GORMAN Shuck, MD;  Location: [facility] ENDOSCOPY;  Service: Cardiovascular;  Laterality: N/A;   CT ANGIOGRAM  FEB 2011   CALCIUM  SCORE 161/ POSS. MODERATE MID LAD STENOSIS   CYSTOSCOPY W/ RETROGRADES  04/28/2011   Procedure: CYSTOSCOPY WITH RETROGRADE PYELOGRAM;  Surgeon: Angela Gwenyth Brooks, MD;  Location[facility];  Service: Urology;  Laterality: Left;   CYSTOSCOPY W/ URETERAL STENT REMOVAL  04/28/2011   Procedure: CYSTOSCOPY WITH STENT REMOVAL;  Surgeon: Angela Gwenyth Brooks, MD;  Location[facility];  Service: Urology;;   EXPLORATORY LAPAROTOMY  2005   LYMPHOMA   EYE SURGERY Bilateral    cataract    GAS INSERTION Right 03/31/2018   Procedure: RIGHT EYE INSERTION OF GAS  C3F8;  Surgeon: Angela Rogue, MD;  Location: [facility] OR;  Service: Ophthalmology;  Laterality: Right;   KNEE ARTHROSCOPY  2005   RIGHT   LASER PHOTO ABLATION Right 03/31/2018   Procedure: RIGHT EYE LASER PHOTO ABLATION;  Surgeon: Angela Rogue, MD;  Location: [facility] OR;  Service: Ophthalmology;  Laterality: Right;   MULTIPLE CYSTO/ LEFT URETERAL STENT EXCHANGES  LAST ONE 10-28-10   RETROPERITONEAL BX  2007   REVERSE SHOULDER ARTHROPLASTY Right 01/31/2015   Procedure: RIGHT REVERSE SHOULDER ARTHROPLASTY;  Surgeon: Angela Pointer, MD;  Location: [facility] OR;  Service: Orthopedics;  Laterality: Right;   TONSILLECTOMY  CHILD   TOTAL KNEE ARTHROPLASTY  OCT 2010   RIGHT   TOTAL KNEE ARTHROPLASTY  JAN 2010   LEFT   TRANSTHORACIC ECHOCARDIOGRAM  05-03-2009   MILD SYSTOLIC DYSFUNCTION/ EF 45-50%/ MODERATE DIASTOLIC DYSFUCTION/ MILD MR/ MILD BIATRIAL ENLARGEMENT   VITRECTOMY 25 GAUGE WITH SCLERAL BUCKLE Right 03/31/2018   Procedure: RIGHT VITRECTOMY 25 GAUGE WITH SCLERAL BUCKLE;  Surgeon: Angela Rogue, MD;  Location: [facility] OR;  Service: Ophthalmology;  Laterality: Right;    reports that she quit smoking about 15 years ago. Her smoking use included cigarettes. She started smoking about 67 years ago. She has a 104 pack-year smoking history. She has never used smokeless tobacco. She reports that she does not currently use alcohol. She reports that she does not use drugs. family history includes Arthritis in her mother; Diabetes in her sister; Heart attack in her mother and sister; Heart disease in her mother; Hypertension in her sister; Liver disease in her son; Stroke in her sister. Allergies  Allergen Reactions   Chlorhexidine      Other reaction(s): Not available   Chlorhexidine  Base Itching    CHG WIPES   Clindamycin /Lincomycin Other (See Comments)    Caused fire like sensation in her stomach    Sulfa  Antibiotics Other (See Comments)    Severe GI upset   Penicillins Hives and Rash    DID THE REACTION INVOLVE:  Swelling of the face/tongue/throat, SOB, or low BP? No Sudden or severe rash/hives, skin peeling, or the inside of the mouth or nose? No Did it require medical treatment? No When did it last happen?  90s If all above answers are "NO", may proceed with cephalosporin use.     Review of Systems  Constitutional:  Negative for chills, fever and malaise/fatigue.  Eyes:  Negative for blurred vision.  Respiratory:  Negative for shortness of breath.   Cardiovascular:  Negative for chest pain.  Neurological:  Negative for dizziness, weakness and headaches.      Objective:     BP 110/70 (BP Location: Right Arm, Patient Position: Sitting, Cuff Size: Large)   Pulse 75   Temp 98 F (36.7 C) (Oral)   Wt 191 lb 14.4 oz (87 kg)   SpO2 97%   BMI 30.97  kg/m  BP Readings from Last 3 Encounters:  10/04/23 110/70  09/21/23 99/76  01/13/23 130/80   Wt Readings from Last 3 Encounters:  10/04/23 191 lb 14.4 oz (87 kg)  09/21/23 186 lb (84.4 kg)  07/19/23 180 lb (81.6 kg)      Physical Exam Vitals reviewed.  Constitutional:      General: She is not in acute distress.    Appearance: Normal appearance. She is well-developed. She is not ill-appearing.  Eyes:     Pupils: Pupils are equal, round, and reactive to light.  Neck:     Thyroid : No thyromegaly.     Vascular: No JVD.  Cardiovascular:     Rate and Rhythm: Normal rate and regular rhythm.     Heart sounds:     No gallop.  Pulmonary:     Effort: Pulmonary effort is normal. No respiratory distress.     Breath sounds: Normal breath sounds. No wheezing or rales.  Musculoskeletal:     Cervical back: Neck supple.     Comments: She has some varicose veins both lower extremities.  Mild hyperpigmentation of both lower legs consistent with venous stasis.  Eschar left lower leg but no signs of cellulitis.  No warmth.  No significant erythema.  Neurological:     Mental Status: She is alert.      No results found for any visits on  10/04/23.  Last CBC Lab Results  Component Value Date   WBC 5.3 09/21/2023   HGB 12.5 09/21/2023   HCT 36.8 09/21/2023   MCV 89.8 09/21/2023   MCH 30.5 09/21/2023   RDW 13.8 09/21/2023   PLT 172 09/21/2023   Last metabolic panel Lab Results  Component Value Date   GLUCOSE 140 (H) 09/21/2023   NA 140 09/21/2023   K 4.1 09/21/2023   CL 103 09/21/2023   CO2 23 09/21/2023   BUN 31 (H) 09/21/2023   CREATININE 1.23 (H) 09/21/2023   GFRNONAA 44 (L) 09/21/2023   CALCIUM  10.0 09/21/2023   PROT 6.5 10/19/2022   ALBUMIN 4.1 10/19/2022   LABGLOB 2.4 10/19/2022   AGRATIO 2.0 10/07/2021   BILITOT 0.2 10/19/2022   ALKPHOS 122 (H) 10/19/2022   AST 19 10/19/2022   ALT 18 10/19/2022   ANIONGAP 13 09/21/2023   Last hemoglobin A1c Lab Results  Component Value Date   HGBA1C 6.5 (A) 01/13/2023      The ASCVD Risk score (Arnett DK, et al., 2019) failed to calculate for the following reasons:   Risk score cannot be calculated because patient has a medical history suggesting prior/existing ASCVD    Assessment & Plan:   #1 venous stasis lower extremities.  Recent Doppler no evidence for DVT.  No ulcerations.  Recommend frequent elevation.  Recommend knee-high venous compression if she can get these on.  Continue current dose of Lasix .  Recent electrolytes stable  #2 hypertension stable and at goal.  Continue diltiazem  and metoprolol   #3 history of type 2 diabetes.  Need to get A1c with follow-up labs.  Set up follow-up in a couple months to reassess  No follow-ups on file.    Wolm Scarlet, MD

## 2023-10-06 ENCOUNTER — Other Ambulatory Visit: Payer: Self-pay | Admitting: Family Medicine

## 2023-10-06 ENCOUNTER — Other Ambulatory Visit

## 2023-10-08 ENCOUNTER — Other Ambulatory Visit: Payer: Self-pay | Admitting: Cardiology

## 2023-10-13 ENCOUNTER — Other Ambulatory Visit: Payer: Self-pay | Admitting: Family Medicine

## 2023-10-13 MED ORDER — VALACYCLOVIR HCL 1 G PO TABS: ORAL_TABLET | ORAL | 0 refills | Status: AC

## 2023-10-13 NOTE — Telephone Encounter (Signed)
 Copied from CRM #[id number]. Topic: Clinical - Medication Refill >> Oct 13, 2023 10:20 AM Viola F wrote: Medication: valACYclovir  (VALTREX ) 1000 MG tablet [[id number]]  Has the patient contacted their pharmacy? Yes (Agent: If no, request that the patient contact the pharmacy for the refill. If patient does not wish to contact the pharmacy document the reason why and proceed with request.) (Agent: If yes, when and what did the pharmacy advise?)  This is the patient's preferred pharmacy:  [id number] - [place], [place] - [address] AT [facility] OF [address] [place] [address] Phone: [phone] Fax: [phone]   Is this the correct pharmacy for this prescription? Yes If no, delete pharmacy and type the correct one.   Has the prescription been filled recently? Yes  Is the patient out of the medication? Yes  Has the patient been seen for an appointment in the last year OR does the patient have an upcoming appointment? Yes  Can we respond through MyChart? No  Agent: Please be advised that Rx refills may take up to 3 business days. We ask that you follow-up with your pharmacy.

## 2023-10-19 ENCOUNTER — Ambulatory Visit: Attending: Internal Medicine | Admitting: Cardiology

## 2023-10-19 ENCOUNTER — Encounter: Payer: Self-pay | Admitting: Cardiology

## 2023-10-19 ENCOUNTER — Ambulatory Visit

## 2023-10-19 ENCOUNTER — Ambulatory Visit: Payer: Self-pay | Admitting: Cardiology

## 2023-10-19 ENCOUNTER — Ambulatory Visit (HOSPITAL_COMMUNITY)
Admission: RE | Admit: 2023-10-19 | Discharge: 2023-10-19 | Disposition: A | Discharge status: Home / Self Care | Source: Ambulatory Visit | Attending: Internal Medicine | Admitting: Internal Medicine

## 2023-10-19 VITALS — BP 100/64 | HR 68 | Ht 66.0 in | Wt 184.4 lb

## 2023-10-19 DIAGNOSIS — I2510 Atherosclerotic heart disease of native coronary artery without angina pectoris: Secondary | ICD-10-CM | POA: Diagnosis not present

## 2023-10-19 DIAGNOSIS — I350 Nonrheumatic aortic (valve) stenosis: Secondary | ICD-10-CM | POA: Diagnosis not present

## 2023-10-19 DIAGNOSIS — E7800 Pure hypercholesterolemia, unspecified: Secondary | ICD-10-CM | POA: Diagnosis not present

## 2023-10-19 DIAGNOSIS — I10 Essential (primary) hypertension: Secondary | ICD-10-CM | POA: Diagnosis not present

## 2023-10-19 DIAGNOSIS — I420 Dilated cardiomyopathy: Secondary | ICD-10-CM | POA: Diagnosis not present

## 2023-10-19 DIAGNOSIS — I4821 Permanent atrial fibrillation: Secondary | ICD-10-CM | POA: Diagnosis not present

## 2023-10-19 DIAGNOSIS — I5032 Chronic diastolic (congestive) heart failure: Secondary | ICD-10-CM

## 2023-10-19 LAB — ECHOCARDIOGRAM COMPLETE
AR max vel: 1.13 cm2
AV Area VTI: 1.32 cm2
AV Area mean vel: 1.29 cm2
AV Mean grad: 7.8 mmHg
AV Peak grad: 16.5 mmHg
Ao pk vel: 2.03 m/s
Area-P 1/2: 4.18 cm2
P 1/2 time: 281 ms
S' Lateral: 3.7 cm

## 2023-10-19 NOTE — Patient Instructions (Signed)
 Medication Instructions:  Stop Diltiazem  *If you need a refill on your cardiac medications before your next appointment, please call your pharmacy*  Lab Work: Fasting lipids, ALT today If you have labs (blood work) drawn today and your tests are completely normal, you will receive your results only by: MyChart Message (if you have MyChart) OR A paper copy in the mail If you have any lab test that is abnormal or we need to change your treatment, we will call you to review the results.  Testing/Procedures: 3 Day Zio Heart Monitor Your physician has requested that you wear a Zio heart monitor for ___3__ days. This will be mailed to your home with instructions on how to apply the monitor and how to return it when finished. Please allow 2 weeks after returning the heart monitor before our office calls you with the results.   Follow-Up: At [facility], you and your health needs are our priority.  As part of our continuing mission to provide you with exceptional heart care, our providers are all part of one team.  This team includes your primary Cardiologist (physician) and Advanced Practice Providers or APPs (Physician Assistants and Nurse Practitioners) who all work together to provide you with the care you need, when you need it.  Your next appointment:   1 year(s)  Provider:   Wilbert Bihari, MD    We recommend signing up for the patient portal called MyChart.  Sign up information is provided on this After Visit Summary.  MyChart is used to connect with patients for Virtual Visits (Telemedicine).  Patients are able to view lab/test results, encounter notes, upcoming appointments, etc.  Non-urgent messages can be sent to your provider as well.   To learn more about what you can do with MyChart, go to [email or web address].   Other Instructions ZIO XT- Long Term Monitor Instructions  Your physician has requested you wear a ZIO patch monitor for 3 days.  This is a single patch  monitor. [employer] supplies one patch monitor per enrollment. Additional stickers are not available. Please do not apply patch if you will be having a Nuclear Stress Test,  Echocardiogram, Cardiac CT, MRI, or Chest Xray during the period you would be wearing the  monitor. The patch cannot be worn during these tests. You cannot remove and re-apply the  ZIO XT patch monitor.  Your ZIO patch monitor will be mailed 3 day USPS to your address on file. It may take 3-5 days  to receive your monitor after you have been enrolled.  Once you have received your monitor, please review the enclosed instructions. Your monitor  has already been registered assigning a specific monitor serial # to you.  Billing and Patient Assistance Program Information  We have supplied [employer] with any of your insurance information on file for billing purposes. [employer] offers a sliding scale Patient Assistance Program for patients that do not have  insurance, or whose insurance does not completely cover the cost of the ZIO monitor.  You must apply for the Patient Assistance Program to qualify for this discounted rate.  To apply, please call [employer] at [phone], select option 4, select option 2, ask to apply for  Patient Assistance Program. Meredeth will ask your household income, and how many people  are in your household. They will quote your out-of-pocket cost based on that information.  [employer] will also be able to set up a 36-month, interest-free payment plan if needed.  Applying the monitor   Shave hair  from upper left chest.  Hold abrader disc by orange tab. Rub abrader in 40 strokes over the upper left chest as  indicated in your monitor instructions.  Clean area with 4 enclosed alcohol pads. Let dry.  Apply patch as indicated in monitor instructions. Patch will be placed under collarbone on left  side of chest with arrow pointing upward.  Rub patch adhesive wings for 2 minutes. Remove white label marked 1.  Remove the white  label marked 2. Rub patch adhesive wings for 2 additional minutes.  While looking in a mirror, press and release button in center of patch. A small green light will  flash 3-4 times. This will be your only indicator that the monitor has been turned on.  Do not shower for the first 24 hours. You may shower after the first 24 hours.  Press the button if you feel a symptom. You will hear a small click. Record Date, Time and  Symptom in the Patient Logbook.  When you are ready to remove the patch, follow instructions on the last 2 pages of Patient  Logbook. Stick patch monitor onto the last page of Patient Logbook.  Place Patient Logbook in the blue and white box. Use locking tab on box and tape box closed  securely. The blue and white box has prepaid postage on it. Please place it in the mailbox as  soon as possible. Your physician should have your test results approximately 7 days after the  monitor has been mailed back to [facility].  Call [employer] Customer Care at [phone] if you have questions regarding  your ZIO XT patch monitor. Call them immediately if you see an orange light blinking on your  monitor.  If your monitor falls off in less than 4 days, contact our Monitor department at [phone].  If your monitor becomes loose or falls off after 4 days call [employer] at [phone] for  suggestions on securing your monitor

## 2023-10-19 NOTE — Progress Notes (Unsigned)
 Enrolled for [employer] to mail a ZIO XT long term holter monitor to the patients address on file.   Request monitor to be mailed 10/26/23.  ZIO serial # [id number] mailed to patient and applied in office.

## 2023-10-19 NOTE — Addendum Note (Signed)
 Addended by: Sharian Delia N on: 10/19/2023 01:36 PM   Modules accepted: Orders

## 2023-10-19 NOTE — Progress Notes (Signed)
 Cardiology Office Note:    Date:  10/19/2023   ID:  [name], DOB June 29, 1945, MRN [id number]  PCP:  Angela Wolm ORN, MD  Cardiologist:  Wilbert Bihari, MD    Referring MD: Angela Wolm ORN, MD   Chief Complaint  Patient presents with   Coronary Artery Disease   Cardiomyopathy   Atrial Fibrillation   Aortic Stenosis   Hypertension   Congestive Heart Failure   Hyperlipidemia    History of Present Illness:    [name] is a 78 y.o. female with a hx of retroperitoneal fibrosis and non-Hodgkin's lymphoma as well as prior DVT.  She has a history of coronary artery calcifications with a moderate calcium  score of 161 (putting her at a high risk percentile for her age) and a possible moderate mid LAD stenosis (though some degradation of images by respiratory artifact). Echo showed mildly reduced  LV systolic function (reported as mildly decreased on myoview ),  EF 45-50% with moderate diastolic dysfunction. The patient opted for medical therapy.    She was diagnosed with paroxysmal atrial fibrillation and was started on warfarin and diltiazem  CD.  Echo in 8/15 showed EF 50-55%, moderate LV hypertrophy, probably mild mitral stenosis, normal RV size and systolic function.  She was initially on dronedarone  to try to maintain NSR, but developed persistent atrial fibrillation. She was changed to amiodarone  and underwent cardioversion to NSR in 4/17 but went back into atrial fibrillation.  The decision was made to pursue rate control and her amio was stopped.  She also has a history of chronic diastolic CHF triggered by afib in the past.  She was changed from warfarin to Eliquis .  She was seen by Orren Fabry, PA on 08/12/2022 and was doing well with very rare palpitations which felt like skipped beats.  She was maintaining A-fib with controlled heart rate that day.  She had had some leg swelling that occurred around the time of a prednisone  treatment after developing allergic reaction  to an antibiotic for cellulitis of her leg.  She is here today for follow-up and is doing well.  She has chronic DOE that is very stable.  She has chronic LE edema and uses compression hose and diuretics. She needs a Rx for the toeless compression hose due to arthritis in her toes.  She denies any chest pain, pressure,  PND, orthopnea, dizziness, palpitations or syncope.  Past Medical History:  Diagnosis Date   ABDOMINAL PAIN RIGHT UPPER QUADRANT 11/08/2008   ALLERGIC RHINITIS 08/01/2009   Anxiety    Aortic stenosis    mild by echo 10/2021   Cataract    Complication of anesthesia 01/31/2015   ? doesn't remember anything after being taken to pre surgery   Coronary artery calcification seen on CAT scan 05/30/2009   moderate risk coronary calcium  score by chest CT with mid LAD plaque>>patient opted for medical management   DCM (dilated cardiomyopathy) (HCC)    EF 45-50% by echo 8/23   DDD (degenerative disc disease), lumbar    DEPRESSION 06/13/2008   DYSLIPIDEMIA 02/27/2009   DYSPNEA 02/27/2009   Dysrhythmia    Afib   Frequency of urination    GERD (gastroesophageal reflux disease)    years ago   Head injury 2016   did not loose consciousness   History of Bell's palsy    History of DVT of lower extremity 2006-- CHRONIC COUMADIN  THERAPY   History of hiatal hernia    History of Lyme disease    Hydronephrosis, left  chronic -- secondary to retroperitoneal fibrosis   Hypertension    Lyme disease    NON-HODGKIN'S LYMPHOMA, HX OF dx  2005--  chemoradiation completed 2006--  no recurrence   onocologist- dr odogwu--    OSTEOARTHRITIS, MODERATE 04/07/2010   Permanent atrial fibrillation (HCC)    On chronic anti-coagulation with warfarin   Pneumonia    x 3 - last time 02/2018   Retroperitoneal fibrosis    Stroke (HCC)    ? - [place]- you have had 3 strokes    Past Surgical History:  Procedure Laterality Date   BREAST MASS EXCISION     BENIGN-- RIGHT BREAST   CARDIOVASCULAR  STRESS TEST  04-04-2009-- PER PT ASYMPTOMATIC-- MEDICAL MANAGEMENT   STUDY WAS EQUIVOCAL/ EF 49%/ GLOBAL HYPOKINESIS/ APPEARS TO BE A MILD ANTERIOR PERFUSION DEFECT COULD REPRESENT ISCHEMIA BUT DUE TO  ACTIVITY WORSE IN STRESS THAN AT REST POSS. THE DEFECT WAS ARTIFACTUAL   CARDIOVERSION N/A 07/11/2015   Procedure: CARDIOVERSION;  Surgeon: Ezra GORMAN Shuck, MD;  Location: [facility] ENDOSCOPY;  Service: Cardiovascular;  Laterality: N/A;   CT ANGIOGRAM  FEB 2011   CALCIUM  SCORE 161/ POSS. MODERATE MID LAD STENOSIS   CYSTOSCOPY W/ RETROGRADES  04/28/2011   Procedure: CYSTOSCOPY WITH RETROGRADE PYELOGRAM;  Surgeon: Donnice Gwenyth Brooks, MD;  Location: [facility];  Service: Urology;  Laterality: Left;   CYSTOSCOPY W/ URETERAL STENT REMOVAL  04/28/2011   Procedure: CYSTOSCOPY WITH STENT REMOVAL;  Surgeon: Donnice Gwenyth Brooks, MD;  Location: [facility];  Service: Urology;;   EXPLORATORY LAPAROTOMY  2005   LYMPHOMA   EYE SURGERY Bilateral    cataract    GAS INSERTION Right 03/31/2018   Procedure: RIGHT EYE INSERTION OF GAS C3F8;  Surgeon: Valdemar Rogue, MD;  Location: [facility] OR;  Service: Ophthalmology;  Laterality: Right;   KNEE ARTHROSCOPY  2005   RIGHT   LASER PHOTO ABLATION Right 03/31/2018   Procedure: RIGHT EYE LASER PHOTO ABLATION;  Surgeon: Valdemar Rogue, MD;  Location: [facility] OR;  Service: Ophthalmology;  Laterality: Right;   MULTIPLE CYSTO/ LEFT URETERAL STENT EXCHANGES  LAST ONE 10-28-10   RETROPERITONEAL BX  2007   REVERSE SHOULDER ARTHROPLASTY Right 01/31/2015   Procedure: RIGHT REVERSE SHOULDER ARTHROPLASTY;  Surgeon: Franky Pointer, MD;  Location: [facility] OR;  Service: Orthopedics;  Laterality: Right;   TONSILLECTOMY  CHILD   TOTAL KNEE ARTHROPLASTY  OCT 2010   RIGHT   TOTAL KNEE ARTHROPLASTY  JAN 2010   LEFT   TRANSTHORACIC ECHOCARDIOGRAM  05-03-2009   MILD SYSTOLIC DYSFUNCTION/ EF 45-50%/ MODERATE DIASTOLIC DYSFUCTION/ MILD MR/ MILD BIATRIAL ENLARGEMENT   VITRECTOMY 25  GAUGE WITH SCLERAL BUCKLE Right 03/31/2018   Procedure: RIGHT VITRECTOMY 25 GAUGE WITH SCLERAL BUCKLE;  Surgeon: Valdemar Rogue, MD;  Location: [facility] OR;  Service: Ophthalmology;  Laterality: Right;    Current Medications: Current Meds  Medication Sig   ALPRAZolam  (XANAX ) 1 MG tablet Take 1 tablet (1 mg total) by mouth 3 (three) times daily.   apixaban  (ELIQUIS ) 5 MG TABS tablet Take 1 tablet (5 mg total) by mouth 2 (two) times daily.   chlorhexidine  (PERIDEX ) 0.12 % solution USE AS DIRECTED. EXPECTORATE - DO NOT SWALLOW   Cholecalciferol  (VITAMIN D ) 2000 UNITS CAPS Take 2,000 Int'l Units by mouth.   diltiazem  (CARDIZEM  CD) 120 MG 24 hr capsule TAKE 1 BY MOUTH EVERY DAY   FLUAD QUADRIVALENT 0.5 ML injection    furosemide  (LASIX ) 20 MG tablet TAKE 1 TABLET BY MOUTH EVERY DAY  lidocaine  (XYLOCAINE ) 5 % ointment Apply 1 Application topically as needed.   Lysine 500 MG TABS Take 500 mg by mouth daily.   metoprolol  succinate (TOPROL -XL) 25 MG 24 hr tablet Take 1 tablet (25 mg total) by mouth daily.   pantoprazole  (PROTONIX ) 40 MG tablet TAKE 1 TABLET BY MOUTH EVERY DAY   Pitavastatin  Calcium  (LIVALO ) 2 MG TABS Take 1 tablet (2 mg total) by mouth daily.   potassium chloride  (KLOR-CON ) 20 MEQ packet Take 20 mEq by mouth daily.   valACYclovir  (VALTREX ) 1000 MG tablet TAKE 2 TABLETS AT ONSET OF COLD SORES AND REPEAT 2 IN 12 HOURS.   [DISCONTINUED] buPROPion  (WELLBUTRIN  XL) 300 MG 24 hr tablet Take 1 tablet (300 mg total) by mouth daily.     Allergies:   Chlorhexidine , Chlorhexidine  base, Clindamycin /lincomycin, Doxorubicin hcl, Sulfa  antibiotics, and Penicillins   Social History   Socioeconomic History   Marital status: Married    Spouse name: Not on file   Number of children: Not on file   Years of education: Not on file   Highest education level: Not on file  Occupational History   Not on file  Tobacco Use   Smoking status: Former    Current packs/day: 0.00    Average packs/day: 2.0  packs/day for 52.0 years (104.0 ttl pk-yrs)    Types: Cigarettes    Start date: 03/24/1956    Quit date: 03/24/2008    Years since quitting: 15.5   Smokeless tobacco: Never  Vaping Use   Vaping status: Never Used  Substance and Sexual Activity   Alcohol use: Not Currently    Comment: RARE   Drug use: No   Sexual activity: Not Currently  Other Topics Concern   Not on file  Social History Narrative   Not on file   Social Drivers of Health   Financial Resource Strain: Low Risk  (07/19/2023)   Overall Financial Resource Strain (CARDIA)    Difficulty of Paying Living Expenses: Not hard at all  Food Insecurity: No Food Insecurity (07/19/2023)   Hunger Vital Sign    Worried About Running Out of Food in the Last Year: Never true    Ran Out of Food in the Last Year: Never true  Transportation Needs: No Transportation Needs (07/19/2023)   PRAPARE - Administrator, Civil Service (Medical): No    Lack of Transportation (Non-Medical): No  Physical Activity: Inactive (07/19/2023)   Exercise Vital Sign    Days of Exercise per Week: 0 days    Minutes of Exercise per Session: 0 min  Stress: No Stress Concern Present (07/19/2023)   [employer] of Occupational Health - Occupational Stress Questionnaire    Feeling of Stress : Not at all  Social Connections: Socially Isolated (07/19/2023)   Social Connection and Isolation Panel    Frequency of Communication with Friends and Family: More than three times a week    Frequency of Social Gatherings with Friends and Family: More than three times a week    Attends Religious Services: Never    Database administrator or Organizations: No    Attends Banker Meetings: Never    Marital Status: Widowed     Family History: The patient's family history includes Arthritis in her mother; Diabetes in her sister; Heart attack in her mother and sister; Heart disease in her mother; Hypertension in her sister; Liver disease in her son;  Stroke in her sister. There is no history of Colon cancer, Stomach  cancer, Pancreatic cancer, Esophageal cancer, or Rectal cancer.  ROS:   Please see the history of present illness.    Review of Systems  Musculoskeletal:  Negative for muscle cramps.    All other systems reviewed and negative.   EKGs/Labs/Other Studies Reviewed:    The following studies were reviewed today: EKG Interpretation Date/Time:  Tuesday October 19 2023 13:01:56 EDT Ventricular Rate:  68 PR Interval:    QRS Duration:  116 QT Interval:  418 QTC Calculation: 444 R Axis:   55  Text Interpretation: Atrial fibrillation Cannot rule out Anterior infarct , age undetermined When compared with ECG of 06-Apr-2018 13:08, No significant change since last tracing Confirmed by Shlomo Corning ([id number]) on 10/19/2023 1:16:15 PM    Recent Labs: 10/19/2022: ALT 18 09/21/2023: BUN 31; Creatinine, Ser 1.23; Hemoglobin 12.5; Platelets 172; Potassium 4.1; Sodium 140   Recent Lipid Panel    Component Value Date/Time   CHOL 156 10/19/2022 1601   TRIG 87 10/19/2022 1601   HDL 69 10/19/2022 1601   CHOLHDL 2.3 10/19/2022 1601   CHOLHDL 2 10/20/2021 1602   VLDL 21.2 10/20/2021 1602   LDLCALC 71 10/19/2022 1601    Physical Exam:    VS:  BP 100/64   Pulse 68   Ht 5' 6 (1.676 m)   Wt 184 lb 6.4 oz (83.6 kg)   SpO2 97%   BMI 29.76 kg/m     Wt Readings from Last 3 Encounters:  10/19/23 184 lb 6.4 oz (83.6 kg)  10/04/23 191 lb 14.4 oz (87 kg)  09/21/23 186 lb (84.4 kg)    GEN: Well nourished, well developed in no acute distress HEENT: Normal NECK: No JVD; No carotid bruits LYMPHATICS: No lymphadenopathy CARDIAC:irregularly irregular, no murmurs, rubs, gallops RESPIRATORY:  Clear to auscultation without rales, wheezing or rhonchi  ABDOMEN: Soft, non-tender, non-distended MUSCULOSKELETAL:  chronic thickening of the LE skin due to chronic venous insufficiency No deformity  SKIN: Warm and dry NEUROLOGIC:  Alert and oriented x  3 PSYCHIATRIC:  Normal affect  ASSESSMENT:    1. Coronary artery calcification seen on CAT scan   2. Dilated cardiomyopathy (HCC)   3. Permanent atrial fibrillation (HCC)   4. Nonrheumatic aortic valve stenosis   5. Essential hypertension   6. Chronic diastolic CHF (congestive heart failure) (HCC)   7. Pure hypercholesterolemia      PLAN:    In order of problems listed above:  1.  Coronary artery calcification seen on CAT scan - She had a coronary calcium  score of 161 in 2011 in the mid LAD - Has not had any anginal symptoms since I saw her last - Continue Toprol -XL 25 mg daily and Livalo  2 mg twice daily with as needed refills - No ASA due to DOAC  2.  Dilated cardiomyopathy  -EF was 50-55% by echo 2018  -EF was 45-50% by echo 2023 -EF was 50% by echo 2024 -Continue Toprol  XL 25 mg daily with as needed refills   3.  Permanent atrial fibrillation  -She remains in atrial fibrillation with controlled ventricular response and denies any palpitations -No bleeding problems on DOAC -Continue apixaban  5 mg twice daily with as needed refills -continue Toprol  XL 25mg  daily -stop Cardizem  due to soft BP -will get a 3 day ziopatch to assess HR control -I have personally reviewed and interpreted outside labs performed by patient's PCP which showed Hbg 12.5, SCr 1.23, K 4.1 on 71/25   4.  Aortic stenosis  - Echo 10/10/2022  showed mild to moderate low-flow low gradient aortic stenosis with mean AVG 9 mmHg, V-max 2.63m/s, AVA 1.1 cm, DVI 0.3 with SVI 24.7. -Repeat 2D echo>>pending today   5.  Hypertension  - BP controlled on exam today and actually on the soft side - Continue Toprol  XL 25mg  daily - stop Cardizem  due to soft BP  6.  Chronic diastolic CHF/Chronic LE edema -EF 50% by echo 10/10/2022 -She appears euvolemic on exam today.  Chronic DOE is stable with diuretic therapy -She has chronic lower extremity edema if she stands for too long of a period of time but is controlled  on Lasix  20 mg daily  -has not been able to tolerate the SGLT2i or ARB/ARNI/MRA due to soft BP but EF is now low normal so will hold off starting any of them -Continue Lasix  20 mg daily and Toprol    7.  Hyperlipidemia  -LDL goal is less than 70.   -Check FLP and ALT -Continue prescription drug management with Livalo  2 mg daily  Followup 1 year  Medication Adjustments/Labs and Tests Ordered: Current medicines are reviewed at length with the patient today.  Concerns regarding medicines are outlined above.  Orders Placed This Encounter  Procedures   EKG 12-Lead   No orders of the defined types were placed in this encounter.   Signed, Wilbert Bihari, MD  10/19/2023 1:18 PM    [facility]

## 2023-10-20 NOTE — Telephone Encounter (Signed)
 Call to patient to discuss Echo which showed low normal heart function EF 50 to 55% with mildly thickened heart muscle, enlarged left atrium, mild to moderate leakiness of the mitral valve, mild to moderately extensive tricuspid valve and moderate calcification of the aortic valve with low-flow low gradient moderate aortic stenosis. Mean gradient 7.8 mmHg, V-max 2.73m/s, DVI 0.32, SVI 25 and AVA 1.32 cm. Patient verbalizes understand and agrees to  repeat 2D echo in 1 year.

## 2023-10-20 NOTE — Telephone Encounter (Signed)
-----   Message from Wilbert Bihari sent at 10/19/2023  9:48 PM EDT ----- Echo showed low normal heart function EF 50 to 55% with mildly thickened heart muscle, enlarged left atrium, mild to moderate leakiness of the mitral valve, mild to moderately extensive tricuspid  valve and moderate calcification of the aortic valve with low-flow low gradient moderate aortic stenosis.  Mean gradient 7.8 mmHg, V-max 2.31m/s, DVI 0.32, SVI 25 and AVA 1.32 cm >>she needs a  repeat 2D echo in 1 year ----- Message ----- From: Interface, [employer] Sent: 10/19/2023   4:25 PM EDT To: Wilbert JONELLE Bihari, MD

## 2023-10-22 DIAGNOSIS — E7800 Pure hypercholesterolemia, unspecified: Secondary | ICD-10-CM | POA: Diagnosis not present

## 2023-10-23 ENCOUNTER — Ambulatory Visit: Payer: Self-pay | Admitting: Cardiology

## 2023-10-23 LAB — ALT: ALT: 10 IU/L (ref 0–32)

## 2023-10-23 LAB — LIPID PANEL
Chol/HDL Ratio: 2.8 ratio (ref 0.0–4.4)
Cholesterol, Total: 129 mg/dL (ref 100–199)
HDL: 46 mg/dL (ref >39)
LDL Chol Calc (NIH): 63 mg/dL (ref 0–99)
Triglycerides: 112 mg/dL (ref 0–149)
VLDL Cholesterol Cal: 20 mg/dL (ref 5–40)

## 2023-10-26 ENCOUNTER — Ambulatory Visit: Attending: Emergency Medicine | Admitting: *Deleted

## 2023-10-26 DIAGNOSIS — I10 Essential (primary) hypertension: Secondary | ICD-10-CM

## 2023-10-26 NOTE — Progress Notes (Signed)
   Nurse Visit   Date of Encounter: 10/26/2023 ID: [name], DOB 02/24/1946, MRN [id number]  PCP:  Micheal Wolm ORN, MD   [facility] Providers Cardiologist:  Wilbert Bihari, MD      Visit Details   VS:  BP 110/62 (BP Location: Left Arm, Patient Position: Sitting, Cuff Size: Normal)   Pulse 72   SpO2 97%  , BMI There is no height or weight on file to calculate BMI.  Wt Readings from Last 3 Encounters:  10/19/23 184 lb 6.4 oz (83.6 kg)  10/04/23 191 lb 14.4 oz (87 kg)  09/21/23 186 lb (84.4 kg)     Reason for visit: BP check (after stopping Diltiazem  on 10/19/23 at [facility]) and apply 3 Day Zio monitor by tech Performed today: Vitals, Provider consulted:Dr. Court (DOD 1), and Education ; BP was 110/62 today. Reviewed proper Blood pressure monitoring techniques (handout and BP log given to pt) and recommended [employer] upper Arm BP monitor (advised pt to call her insurance for reimbursement or pay ~$37 at [facility] outpt Pharmacy)       3 Day Zio Monitor applied by Rico Dixons.  Changes (medications, testing, etc.) : NO Changes; continue current meds  Length of Visit: 10 minutes    Medications Adjustments/Labs and Tests Ordered: No orders of the defined types were placed in this encounter.  No orders of the defined types were placed in this encounter.    Signed, Zara Buel Fret, RN  10/26/2023 4:11 PM

## 2023-10-27 NOTE — Telephone Encounter (Signed)
 Call to patient to advise Lipids at goal, patient agrees to continue current therapy. forwarded to PCP.

## 2023-10-27 NOTE — Telephone Encounter (Signed)
-----   Message from [name] sent at 10/23/2023  6:09 PM EDT ----- Lipids at goal continue current therapy and forward to PCP ----- Message ----- From: Interface, [facility] Lab Results In Sent: 10/23/2023   2:35 AM EDT To: Angela JONELLE Bihari, MD

## 2023-10-28 ENCOUNTER — Telehealth: Payer: Self-pay | Admitting: Cardiology

## 2023-10-28 NOTE — Telephone Encounter (Signed)
 Pt states that at last visit, provider wrote a script for Compression Socks. Pt states that she has missed placed it and would ike to know if another can be mailed. Pt would like a c/b regarding this matter. Please advise

## 2023-11-05 NOTE — Telephone Encounter (Signed)
 Call to patient to advise that Dr. Shlomo is out of the office this week but would be able to sign a new script next week. No answer, LVM per DPR explaining I would mail a new script next week.

## 2023-11-08 DIAGNOSIS — H353132 Nonexudative age-related macular degeneration, bilateral, intermediate dry stage: Secondary | ICD-10-CM | POA: Diagnosis not present

## 2023-11-08 DIAGNOSIS — I4821 Permanent atrial fibrillation: Secondary | ICD-10-CM

## 2023-11-08 DIAGNOSIS — H04123 Dry eye syndrome of bilateral lacrimal glands: Secondary | ICD-10-CM | POA: Diagnosis not present

## 2023-11-10 ENCOUNTER — Telehealth: Payer: Self-pay | Admitting: Cardiology

## 2023-11-10 DIAGNOSIS — I4821 Permanent atrial fibrillation: Secondary | ICD-10-CM

## 2023-11-10 NOTE — Telephone Encounter (Signed)
 Pt c/o medication issue:  1. Name of Medication: diltiazem  (CARDIZEM  CD) 120 MG 24 hr capsule   2. How are you currently taking this medication (dosage and times per day)?   3. Are you having a reaction (difficulty breathing--STAT)? No  4. What is your medication issue?Pt would like to know if she is to be taking above medication. Pt also has questions regarding BP log and monitor results

## 2023-11-10 NOTE — Telephone Encounter (Signed)
 Spoke with patient of Dr. Shlomo. She was advised to stop diltiazem  on 7/29. She has noticed more AFib since stopping this med, feeling more fibrillations. She notices the symptoms when resting. She has since worn a heart monitor -- results provided. She is keeping a BP log.   BP readings:  106/83 114/76 112/76 120/69 125/76   Wilbert JONELLE Shlomo, MD 11/08/2023  5:08 PM EDT     Heart monitor showed chronic atrial fibrillation with good heart rate control with an average heart rate of 79 bpm   She is curious if she is able to resume diltiazem  and perhaps decrease beta-blocker dose.   Advised will route message to MD to review.

## 2023-11-11 ENCOUNTER — Ambulatory Visit: Attending: Cardiology

## 2023-11-11 DIAGNOSIS — I4821 Permanent atrial fibrillation: Secondary | ICD-10-CM

## 2023-11-11 NOTE — Telephone Encounter (Signed)
 Pt called in and would like to know if a nurse will give her a call today about her medication.    Best number [phone]

## 2023-11-11 NOTE — Progress Notes (Unsigned)
 Enrolled patient for a 3 day Zio XT monitor to be mailed to patients home  [id number] was mailed to patient and applied in office.

## 2023-11-11 NOTE — Telephone Encounter (Signed)
 Shlomo Wilbert SAUNDERS, MD to [place] Andriette HERO, RN  Janit Geni CROME, RN     11/11/23  9:27 AM We stopped the Cardizem  on the 29th and she did the heart monitor on August 5.  I guess there could have possibly still been some Cardizem  in her system.  Lets go ahead and repeat a 3-day Zio patch to assess heart rate control  She reports that she feels that the afib was controlled better with the Cardizem  , but it was stopped due to low bp. She reports that her bp is good now. She says that she feels the fluttering mostly when laying down, resting.   Gave her the message from Dr Shlomo. Informed her that the monitor will be sent to her house and instructions included. She verbalized understanding.

## 2023-11-12 NOTE — Telephone Encounter (Signed)
 Call to patient to advise Heart monitor showed chronic atrial fibrillation with good heart rate control with an average heart rate of 79 bpm. Patient verbalizes understanding.

## 2023-11-12 NOTE — Telephone Encounter (Signed)
-----   Message from [name] sent at 11/08/2023  5:08 PM EDT ----- Heart monitor showed chronic atrial fibrillation with good heart rate control with an average heart rate of 79 bpm ----- Message ----- From: Bray Angela SAUNDERS, MD Sent: 11/08/2023   5:07 PM EDT To: Angela SAUNDERS Bihari, MD

## 2023-11-12 NOTE — Telephone Encounter (Signed)
 Call to patient to advise that script would be ready for her to pick up next week. Patient states she found script and just needed to know where to go in [place] to get compression hose. Gave contact info for [facility] in [place].

## 2023-11-15 ENCOUNTER — Telehealth: Payer: Self-pay | Admitting: Cardiology

## 2023-11-15 NOTE — Telephone Encounter (Signed)
 Patient would like to schedule an appointment for assistance with placing heart monitor.

## 2023-11-16 ENCOUNTER — Ambulatory Visit: Attending: Cardiology

## 2023-11-16 NOTE — Telephone Encounter (Signed)
 Patient scheduled to have monitor applied this afternoon at 4:00 PM.

## 2023-11-16 NOTE — Telephone Encounter (Signed)
 Patient called to follow-up on getting her monitor placed.

## 2023-11-17 ENCOUNTER — Other Ambulatory Visit: Payer: Self-pay | Admitting: Cardiology

## 2023-11-18 ENCOUNTER — Ambulatory Visit (INDEPENDENT_AMBULATORY_CARE_PROVIDER_SITE_OTHER): Admitting: Family

## 2023-11-18 ENCOUNTER — Ambulatory Visit: Payer: Self-pay

## 2023-11-18 VITALS — BP 106/68 | HR 93 | Temp 97.7°F | Ht 66.0 in | Wt 182.2 lb

## 2023-11-18 DIAGNOSIS — L03119 Cellulitis of unspecified part of limb: Secondary | ICD-10-CM | POA: Diagnosis not present

## 2023-11-18 MED ORDER — CEPHALEXIN 500 MG PO CAPS: 500.0000 mg | ORAL_CAPSULE | Freq: Three times a day (TID) | ORAL | 0 refills | Status: DC

## 2023-11-18 NOTE — Progress Notes (Signed)
 Acute Office Visit  Subjective:     Patient ID: [name], female    DOB: 09-23-1945, 78 y.o.   MRN: [id number]  No chief complaint on file.   HPI Patient is in today with complaints of redness to her left lower extremity.  She has noticed mild swelling although she reports having swelling in both lower extremities on a regular basis.  She is concerned that is becoming more red and is getting closer to the weekend.  Has a history of cellulitis in the past.  Denies any fever or chills.  Review of Systems  Constitutional: Negative.   Respiratory: Negative.    Cardiovascular:  Positive for leg swelling.  Musculoskeletal: Negative.   Skin:        Left lower extremity redness  Neurological: Negative.   Endo/Heme/Allergies: Negative.   Psychiatric/Behavioral: Negative.      Past Medical History:  Diagnosis Date  . ABDOMINAL PAIN RIGHT UPPER QUADRANT 11/08/2008  . ALLERGIC RHINITIS 08/01/2009  . Anxiety   . Aortic stenosis    moderate low-flow low gradient aortic stenosis by echo 09/2023.  Mean gradient 7.8 mmHg, V-max 2.03, DVI 0.32, SVI 25, AVA 1.32 cm  . Cataract   . Complication of anesthesia 01/31/2015   ? doesn't remember anything after being taken to pre surgery  . Coronary artery calcification seen on CAT scan 05/30/2009   moderate risk coronary calcium  score by chest CT with mid LAD plaque>>patient opted for medical management  . DCM (dilated cardiomyopathy) (HCC)    EF 45-50% by echo 8/23  . DDD (degenerative disc disease), lumbar   . DEPRESSION 06/13/2008  . DYSLIPIDEMIA 02/27/2009  . DYSPNEA 02/27/2009  . Dysrhythmia    Afib  . Frequency of urination   . GERD (gastroesophageal reflux disease)    years ago  . Head injury 2016   did not loose consciousness  . History of Bell's palsy   . History of DVT of lower extremity 2006-- CHRONIC COUMADIN  THERAPY  . History of hiatal hernia   . History of Lyme disease   . Hydronephrosis, left chronic --  secondary to retroperitoneal fibrosis  . Hypertension   . Lyme disease   . NON-HODGKIN'S LYMPHOMA, HX OF dx  2005--  chemoradiation completed 2006--  no recurrence   onocologist- dr odogwu--   . OSTEOARTHRITIS, MODERATE 04/07/2010  . Permanent atrial fibrillation (HCC)    On chronic anti-coagulation with warfarin  . Pneumonia    x 3 - last time 02/2018  . Retroperitoneal fibrosis   . Stroke [facility])    ? - [place]- you have had 3 strokes    Social History   Socioeconomic History  . Marital status: Married    Spouse name: Not on file  . Number of children: Not on file  . Years of education: Not on file  . Highest education level: Not on file  Occupational History  . Not on file  Tobacco Use  . Smoking status: Former    Current packs/day: 0.00    Average packs/day: 2.0 packs/day for 52.0 years (104.0 ttl pk-yrs)    Types: Cigarettes    Start date: 03/24/1956    Quit date: 03/24/2008    Years since quitting: 15.6  . Smokeless tobacco: Never  Vaping Use  . Vaping status: Never Used  Substance and Sexual Activity  . Alcohol use: Not Currently    Comment: RARE  . Drug use: No  . Sexual activity: Not Currently  Other Topics Concern  .  Not on file  Social History Narrative  . Not on file   Social Drivers of Health   Financial Resource Strain: Low Risk  (07/19/2023)   Overall Financial Resource Strain (CARDIA)   . Difficulty of Paying Living Expenses: Not hard at all  Food Insecurity: No Food Insecurity (07/19/2023)   Hunger Vital Sign   . Worried About Programme researcher, broadcasting/film/video in the Last Year: Never true   . Ran Out of Food in the Last Year: Never true  Transportation Needs: No Transportation Needs (07/19/2023)   PRAPARE - Transportation   . Lack of Transportation (Medical): No   . Lack of Transportation (Non-Medical): No  Physical Activity: Inactive (07/19/2023)   Exercise Vital Sign   . Days of Exercise per Week: 0 days   . Minutes of Exercise per Session: 0 min  Stress: No  Stress Concern Present (07/19/2023)   [employer] of Occupational Health - Occupational Stress Questionnaire   . Feeling of Stress : Not at all  Social Connections: Socially Isolated (07/19/2023)   Social Connection and Isolation Panel   . Frequency of Communication with Friends and Family: More than three times a week   . Frequency of Social Gatherings with Friends and Family: More than three times a week   . Attends Religious Services: Never   . Active Member of Clubs or Organizations: No   . Attends Banker Meetings: Never   . Marital Status: Widowed  Intimate Partner Violence: Not At Risk (07/19/2023)   Humiliation, Afraid, Rape, and Kick questionnaire   . Fear of Current or Ex-Partner: No   . Emotionally Abused: No   . Physically Abused: No   . Sexually Abused: No    Past Surgical History:  Procedure Laterality Date  . BREAST MASS EXCISION     BENIGN-- RIGHT BREAST  . CARDIOVASCULAR STRESS TEST  04-04-2009-- PER PT ASYMPTOMATIC-- MEDICAL MANAGEMENT   STUDY WAS EQUIVOCAL/ EF 49%/ GLOBAL HYPOKINESIS/ APPEARS TO BE A MILD ANTERIOR PERFUSION DEFECT COULD REPRESENT ISCHEMIA BUT DUE TO  ACTIVITY WORSE IN STRESS THAN AT REST POSS. THE DEFECT WAS ARTIFACTUAL  . CARDIOVERSION N/A 07/11/2015   Procedure: CARDIOVERSION;  Surgeon: Ezra GORMAN Shuck, MD;  Location: [facility] ENDOSCOPY;  Service: Cardiovascular;  Laterality: N/A;  . CT ANGIOGRAM  FEB 2011   CALCIUM  SCORE 161/ POSS. MODERATE MID LAD STENOSIS  . CYSTOSCOPY W/ RETROGRADES  04/28/2011   Procedure: CYSTOSCOPY WITH RETROGRADE PYELOGRAM;  Surgeon: Donnice Gwenyth Brooks, MD;  Location: [facility];  Service: Urology;  Laterality: Left;  . CYSTOSCOPY W/ URETERAL STENT REMOVAL  04/28/2011   Procedure: CYSTOSCOPY WITH STENT REMOVAL;  Surgeon: Donnice Gwenyth Brooks, MD;  Location: [facility];  Service: Urology;;  . EXPLORATORY LAPAROTOMY  2005   LYMPHOMA  . EYE SURGERY Bilateral    cataract   .  GAS INSERTION Right 03/31/2018   Procedure: RIGHT EYE INSERTION OF GAS C3F8;  Surgeon: Valdemar Rogue, MD;  Location: [facility] OR;  Service: Ophthalmology;  Laterality: Right;  . KNEE ARTHROSCOPY  2005   RIGHT  . LASER PHOTO ABLATION Right 03/31/2018   Procedure: RIGHT EYE LASER PHOTO ABLATION;  Surgeon: Valdemar Rogue, MD;  Location: [facility] OR;  Service: Ophthalmology;  Laterality: Right;  . MULTIPLE CYSTO/ LEFT URETERAL STENT EXCHANGES  LAST ONE 10-28-10  . RETROPERITONEAL BX  2007  . REVERSE SHOULDER ARTHROPLASTY Right 01/31/2015   Procedure: RIGHT REVERSE SHOULDER ARTHROPLASTY;  Surgeon: Franky Pointer, MD;  Location: [facility] OR;  Service: Orthopedics;  Laterality: Right;  . TONSILLECTOMY  CHILD  . TOTAL KNEE ARTHROPLASTY  OCT 2010   RIGHT  . TOTAL KNEE ARTHROPLASTY  JAN 2010   LEFT  . TRANSTHORACIC ECHOCARDIOGRAM  05-03-2009   MILD SYSTOLIC DYSFUNCTION/ EF 45-50%/ MODERATE DIASTOLIC DYSFUCTION/ MILD MR/ MILD BIATRIAL ENLARGEMENT  . VITRECTOMY 25 GAUGE WITH SCLERAL BUCKLE Right 03/31/2018   Procedure: RIGHT VITRECTOMY 25 GAUGE WITH SCLERAL BUCKLE;  Surgeon: Valdemar Rogue, MD;  Location: [facility] OR;  Service: Ophthalmology;  Laterality: Right;    Family History  Problem Relation Age of Onset  . Arthritis Mother   . Heart disease Mother   . Heart attack Mother   . Diabetes Sister   . Hypertension Sister   . Stroke Sister   . Heart attack Sister   . Liver disease Son        r/t Tylenol  use and alcoholism  . Colon cancer Neg Hx   . Stomach cancer Neg Hx   . Pancreatic cancer Neg Hx   . Esophageal cancer Neg Hx   . Rectal cancer Neg Hx     Allergies  Allergen Reactions  . Chlorhexidine      Other reaction(s): Not available  . Chlorhexidine  Base Itching    CHG WIPES  . Clindamycin /Lincomycin Other (See Comments)    Caused fire like sensation in her stomach   . Doxorubicin Hcl Swelling  . Sulfa  Antibiotics Other (See Comments)    Severe GI upset  . Penicillins Hives and Rash    DID THE REACTION  INVOLVE: Swelling of the face/tongue/throat, SOB, or low BP? No Sudden or severe rash/hives, skin peeling, or the inside of the mouth or nose? No Did it require medical treatment? No When did it last happen?  90s If all above answers are "NO", may proceed with cephalosporin use.     Current Outpatient Medications on File Prior to Visit  Medication Sig Dispense Refill  . ALPRAZolam  (XANAX ) 1 MG tablet Take 1 tablet (1 mg total) by mouth 3 (three) times daily. 90 tablet 2  . apixaban  (ELIQUIS ) 5 MG TABS tablet Take 1 tablet (5 mg total) by mouth 2 (two) times daily. 60 tablet 5  . chlorhexidine  (PERIDEX ) 0.12 % solution USE AS DIRECTED. EXPECTORATE - DO NOT SWALLOW 473 mL 0  . Cholecalciferol  (VITAMIN D ) 2000 UNITS CAPS Take 2,000 Int'l Units by mouth.    . Coenzyme Q10 100 MG TABS Take 100 mg by mouth daily. Pt unsure of dose; may be 100 mg    . FLUAD QUADRIVALENT 0.5 ML injection     . furosemide  (LASIX ) 20 MG tablet TAKE 1 TABLET BY MOUTH EVERY DAY 90 tablet 3  . lidocaine  (XYLOCAINE ) 5 % ointment Apply 1 Application topically as needed. 35.44 g 0  . Lysine 500 MG TABS Take 500 mg by mouth daily.    . metoprolol  succinate (TOPROL -XL) 25 MG 24 hr tablet Take 1 tablet (25 mg total) by mouth daily. 90 tablet 3  . pantoprazole  (PROTONIX ) 40 MG tablet TAKE 1 TABLET BY MOUTH EVERY DAY 90 tablet 0  . Pitavastatin  Calcium  (LIVALO ) 2 MG TABS Take 1 tablet (2 mg total) by mouth daily. 90 tablet 1  . potassium chloride  (KLOR-CON ) 20 MEQ packet Take 20 mEq by mouth daily. 30 each 11  . valACYclovir  (VALTREX ) 1000 MG tablet TAKE 2 TABLETS AT ONSET OF COLD SORES AND REPEAT 2 IN 12 HOURS. 30 tablet 0  . diltiazem  (CARDIZEM  CD) 120 MG 24 hr capsule  TAKE 1 BY MOUTH EVERY DAY (Patient not taking: Reported on 11/18/2023) 60 capsule 3   No current facility-administered medications on file prior to visit.    BP 106/68   Pulse 93   Temp 97.7 F (36.5 C) (Temporal)   Ht 5' 6 (1.676 m)   Wt 182 lb 4 oz  (82.7 kg)   SpO2 97%   BMI 29.42 kg/m chart     Objective:    BP 106/68   Pulse 93   Temp 97.7 F (36.5 C) (Temporal)   Ht 5' 6 (1.676 m)   Wt 182 lb 4 oz (82.7 kg)   SpO2 97%   BMI 29.42 kg/m    Physical Exam Vitals reviewed.  Constitutional:      Appearance: Normal appearance. She is normal weight.  Cardiovascular:     Rate and Rhythm: Normal rate and regular rhythm.     Pulses: Normal pulses.     Heart sounds: Normal heart sounds.  Pulmonary:     Effort: Pulmonary effort is normal.     Breath sounds: Normal breath sounds.  Musculoskeletal:        General: Swelling and tenderness present. Normal range of motion.  Skin:    General: Skin is warm and dry.     Findings: Erythema present.     Comments: Erythema noted to the left lower extremity anteriorly.  Mildly tender.  Right lower extremity is normal.  1+ pitting edema noted bilaterally  Neurological:     General: No focal deficit present.     Mental Status: She is alert and oriented to person, place, and time. Mental status is at baseline.  Psychiatric:        Mood and Affect: Mood normal.        Behavior: Behavior normal.    No results found for any visits on 11/18/23.      Assessment & Plan:   Problem List Items Addressed This Visit   None Visit Diagnoses       Cellulitis of lower extremity, unspecified laterality    -  Primary       Meds ordered this encounter  Medications  . cephALEXin  (KEFLEX ) 500 MG capsule    Sig: Take 1 capsule (500 mg total) by mouth 3 (three) times daily.    Dispense:  21 capsule    Refill:  0   Call the office if symptoms worsen or persist.  Elevate the legs as much as you can at home.  Recheck as scheduled and sooner as needed. No follow-ups on file.  Dakiyah Heinke B Husna Krone, FNP

## 2023-11-18 NOTE — Telephone Encounter (Signed)
 FYI Only or Action Required?: FYI only for provider.  Patient was last seen in primary care on 10/04/2023 by Micheal Wolm ORN, MD.  Called Nurse Triage reporting Triage.  Symptoms began several days ago.  Interventions attempted: Rest, hydration, or home remedies.  Symptoms are: gradually worsening.  Triage Disposition: See HCP Within 4 Hours (Or PCP Triage)  Patient/caregiver understands and will follow disposition?: Yes Reason for Disposition  [1] Thigh, calf, or ankle swelling AND [2] only 1 side  Answer Assessment - Initial Assessment Questions States was scanned recently for DVT and was negative. States her cardiologist told her that a DVT is unlikely d/t eliquis . Edema present x 1 year, now appears red x 2 days. Denies SOB and is speaking in clear and full, long sentences.  1. ONSET: When did the swelling start? (e.g., minutes, hours, days)     1 year ago  2. LOCATION: What part of the leg is swollen?  Are both legs swollen or just one leg?     Left lower leg  3. SEVERITY: How bad is the swelling? (e.g., localized; mild, moderate, severe)     Severe  4. REDNESS: Is there redness or signs of infection?     Yes  5. MEDICAL HISTORY: Do you have a history of blood clots (e.g., DVT), cancer, heart failure, kidney disease, or liver failure? Coronary Artery Disease Cardiomyopathy Atrial Fibrillation Aortic Stenosis Hypertension Congestive Heart Failure Hyperlipidemia DVT  Protocols used: Leg Swelling and Edema-A-AH Copied from CRM #[id number]. Topic: Clinical - Red Word Triage >> Nov 18, 2023 11:50 AM Rea BROCKS wrote: Red Word that prompted transfer to Nurse Triage: Patient has edema, left leg is more swollen now, and stated to have had weeping edema. Patient usually has discoloration in ankle but now it has gone up the leg- looks like blood underneath, legs are currently elevated.

## 2023-11-19 ENCOUNTER — Telehealth: Payer: Self-pay

## 2023-11-19 NOTE — Telephone Encounter (Signed)
 Patient informed of the message below and voiced understanding

## 2023-11-19 NOTE — Telephone Encounter (Signed)
 Copied from CRM #[id number]. Topic: Clinical - Medical Advice >> Nov 19, 2023  3:05 PM Shereese L wrote: Reason for CRM: Patient is calling to verify if she needs to elevate her legs the entire 7 days or in increments and wants to be contact with instructions on what to do Please call cell phone  [phone]

## 2023-11-24 ENCOUNTER — Telehealth: Payer: Self-pay | Admitting: Family Medicine

## 2023-11-24 ENCOUNTER — Telehealth: Payer: Self-pay

## 2023-11-24 NOTE — Telephone Encounter (Signed)
 Copied from CRM [id number]. Topic: Clinical - Medical Advice >> Nov 24, 2023  3:36 PM Chasity T wrote: Reason for CRM: Patient was currently seen in office and states she is almost done with her medication but wants to know if she needs to keep her feet elevated for 2 more days or is everything fine. She is also wanting to know if she needs to make a follow up appointment.  Call back at [phone]

## 2023-11-24 NOTE — Telephone Encounter (Unsigned)
 Copied from CRM [id number]. Topic: Clinical - Medication Refill >> Nov 24, 2023  3:38 PM Chasity T wrote: Medication:   pantoprazole  (PROTONIX ) 40 MG tablet chlorhexidine  (PERIDEX ) 0.12 % solution   Has the patient contacted their pharmacy? Yes   This is the patient's preferred pharmacy:  [id number] - [place], [place] - [address] AT [facility] [address] & [address] [place]  [id number] Phone: [phone] Fax: [phone]  Is this the correct pharmacy for this prescription? Yes If no, delete pharmacy and type the correct one.   Has the prescription been filled recently? No  Is the patient out of the medication? Yes  Has the patient been seen for an appointment in the last year OR does the patient have an upcoming appointment? Yes  Can we respond through MyChart? Yes  Agent: Please be advised that Rx refills may take up to 3 business days. We ask that you follow-up with your pharmacy.

## 2023-11-26 NOTE — Telephone Encounter (Signed)
 Follow up scheduled. Patient inquired if she should continue with elevation daily or can she just elevate prn until her follow up appointment

## 2023-11-26 NOTE — Telephone Encounter (Signed)
 Patient informed of the message below and voiced understanding

## 2023-12-01 ENCOUNTER — Encounter: Payer: Self-pay | Admitting: Family Medicine

## 2023-12-01 ENCOUNTER — Ambulatory Visit (INDEPENDENT_AMBULATORY_CARE_PROVIDER_SITE_OTHER): Admitting: Family Medicine

## 2023-12-01 VITALS — BP 116/60 | HR 102 | Wt 178.4 lb

## 2023-12-01 DIAGNOSIS — Q809 Congenital ichthyosis, unspecified: Secondary | ICD-10-CM | POA: Diagnosis not present

## 2023-12-01 DIAGNOSIS — K219 Gastro-esophageal reflux disease without esophagitis: Secondary | ICD-10-CM | POA: Diagnosis not present

## 2023-12-01 DIAGNOSIS — Z23 Encounter for immunization: Secondary | ICD-10-CM

## 2023-12-01 DIAGNOSIS — K130 Diseases of lips: Secondary | ICD-10-CM

## 2023-12-01 DIAGNOSIS — N1831 Chronic kidney disease, stage 3a: Secondary | ICD-10-CM | POA: Diagnosis not present

## 2023-12-01 DIAGNOSIS — E1122 Type 2 diabetes mellitus with diabetic chronic kidney disease: Secondary | ICD-10-CM | POA: Diagnosis not present

## 2023-12-01 LAB — POCT GLYCOSYLATED HEMOGLOBIN (HGB A1C): Hemoglobin A1C: 6.6 % — AB (ref 4.0–5.6)

## 2023-12-01 MED ORDER — PANTOPRAZOLE SODIUM 40 MG PO TBEC: 40.0000 mg | DELAYED_RELEASE_TABLET | Freq: Every day | ORAL | 3 refills | Status: AC

## 2023-12-01 MED ORDER — CLOTRIMAZOLE-BETAMETHASONE 1-0.05 % EX CREA: 1.0000 | TOPICAL_CREAM | Freq: Every day | CUTANEOUS | 0 refills | Status: AC

## 2023-12-01 MED ORDER — UREA 20 % EX CREA: TOPICAL_CREAM | CUTANEOUS | 1 refills | Status: AC | PRN

## 2023-12-01 MED ORDER — CHLORHEXIDINE GLUCONATE 0.12 % MT SOLN: OROMUCOSAL | 0 refills | Status: DC

## 2023-12-01 NOTE — Progress Notes (Signed)
 Established Patient Office Visit  Subjective   Patient ID: [name], female    DOB: [birth year]  Age: 78 y.o. MRN: [id number]  Chief Complaint  Patient presents with   Medical Management of Chronic Issues    HPI   [name] is seen today for several items  She was recently seen here on the 28th with concern for cellulitis left leg.  She was placed on Keflex .  She feels like redness has improved.  She has some chronic pigmentary changes of both legs.  History of some venous stasis.  No recent fever.  Recently saw her cardiologist.  Her blood pressures were low and they held her diltiazem .  She is still taking Eliquis  and metoprolol .  She has history of very dry skin on lower legs frequent scaling.  Currently not using any lotions.  History of type 2 diabetes.  A1c a year ago 6.5%.  Currently not treated with medications.  No polyuria or polydipsia  Longstanding history of GERD.  She is on Protonix  and requesting refills.  No active GERD symptoms.  She relates irritation corners of mouth bilaterally.  Some soreness.  No recent dental procedures.  Past Medical History:  Diagnosis Date   ABDOMINAL PAIN RIGHT UPPER QUADRANT 11/08/2008   ALLERGIC RHINITIS 08/01/2009   Anxiety    Aortic stenosis    moderate low-flow low gradient aortic stenosis by echo 09/2023.  Mean gradient 7.8 mmHg, V-max 2.03, DVI 0.32, SVI 25, AVA 1.32 cm   Cataract    Complication of anesthesia 01/31/2015   ? doesn't remember anything after being taken to pre surgery   Coronary artery calcification seen on CAT scan 05/30/2009   moderate risk coronary calcium  score by chest CT with mid LAD plaque>>patient opted for medical management   DCM (dilated cardiomyopathy) (HCC)    EF 45-50% by echo 8/23   DDD (degenerative disc disease), lumbar    DEPRESSION 06/13/2008   DYSLIPIDEMIA 02/27/2009   DYSPNEA 02/27/2009   Dysrhythmia    Afib   Frequency of urination    GERD (gastroesophageal reflux  disease)    years ago   Head injury 2016   did not loose consciousness   History of Bell's palsy    History of DVT of lower extremity 2006-- CHRONIC COUMADIN  THERAPY   History of hiatal hernia    History of Lyme disease    Hydronephrosis, 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 - [place]- you have had 3 strokes   Past Surgical History:  Procedure Laterality Date   BREAST MASS EXCISION     BENIGN-- RIGHT BREAST   CARDIOVASCULAR STRESS TEST  04-04-2009-- PER PT ASYMPTOMATIC-- MEDICAL MANAGEMENT   STUDY WAS EQUIVOCAL/ EF 49%/ GLOBAL HYPOKINESIS/ APPEARS TO BE A MILD ANTERIOR PERFUSION DEFECT COULD REPRESENT ISCHEMIA BUT DUE TO  ACTIVITY WORSE IN STRESS THAN AT REST POSS. THE DEFECT WAS ARTIFACTUAL   CARDIOVERSION N/A 07/11/2015   Procedure: CARDIOVERSION;  Surgeon: Ezra GORMAN Shuck, MD;  Location: [facility] ENDOSCOPY;  Service: Cardiovascular;  Laterality: N/A;   CT ANGIOGRAM  FEB  2011   CALCIUM  SCORE 161/ POSS. MODERATE MID LAD STENOSIS   CYSTOSCOPY W/ RETROGRADES  04/28/2011   Procedure: CYSTOSCOPY WITH RETROGRADE PYELOGRAM;  Surgeon: Donnice Gwenyth Brooks, MD;  Location: [facility];  Service: Urology;  Laterality: Left;   CYSTOSCOPY W/ URETERAL STENT REMOVAL  04/28/2011   Procedure: CYSTOSCOPY WITH STENT REMOVAL;  Surgeon: Donnice Gwenyth Brooks, MD;  Location: [facility];  Service: Urology;;   EXPLORATORY LAPAROTOMY  2005   LYMPHOMA   EYE SURGERY Bilateral    cataract    GAS INSERTION Right 03/31/2018   Procedure: RIGHT EYE INSERTION OF GAS C3F8;  Surgeon: Valdemar Rogue, MD;  Location:  [facility] OR;  Service: Ophthalmology;  Laterality: Right;   KNEE ARTHROSCOPY  2005   RIGHT   LASER PHOTO ABLATION Right 03/31/2018   Procedure: RIGHT EYE LASER PHOTO ABLATION;  Surgeon: Valdemar Rogue, MD;  Location: [facility] OR;  Service: Ophthalmology;  Laterality: Right;   MULTIPLE CYSTO/ LEFT URETERAL STENT EXCHANGES  LAST ONE 10-28-10   RETROPERITONEAL BX  2007   REVERSE SHOULDER ARTHROPLASTY Right 01/31/2015   Procedure: RIGHT REVERSE SHOULDER ARTHROPLASTY;  Surgeon: Franky Pointer, MD;  Location: [facility] OR;  Service: Orthopedics;  Laterality: Right;   TONSILLECTOMY  CHILD   TOTAL KNEE ARTHROPLASTY  OCT 2010   RIGHT   TOTAL KNEE ARTHROPLASTY  JAN 2010   LEFT   TRANSTHORACIC ECHOCARDIOGRAM  05-03-2009   MILD SYSTOLIC DYSFUNCTION/ EF 45-50%/ MODERATE DIASTOLIC DYSFUCTION/ MILD MR/ MILD BIATRIAL ENLARGEMENT   VITRECTOMY 25 GAUGE WITH SCLERAL BUCKLE Right 03/31/2018   Procedure: RIGHT VITRECTOMY 25 GAUGE WITH SCLERAL BUCKLE;  Surgeon: Valdemar Rogue, MD;  Location: [facility] OR;  Service: Ophthalmology;  Laterality: Right;    reports that she quit smoking about 15 years ago. Her smoking use included cigarettes. She started smoking about 67 years ago. She has a 104 pack-year smoking history. She has never used smokeless tobacco. She reports that she does not currently use alcohol. She reports that she does not use drugs. family history includes Arthritis in her mother; Diabetes in her sister; Heart attack in her mother and sister; Heart disease in her mother; Hypertension in her sister; Liver disease in her son; Stroke in her sister. Allergies  Allergen Reactions   Chlorhexidine      Other reaction(s): Not available   Chlorhexidine  Base Itching    CHG WIPES   Clindamycin /Lincomycin Other (See Comments)    Caused fire like sensation in her stomach    Doxorubicin Hcl Swelling   Sulfa  Antibiotics Other (See Comments)    Severe GI upset   Penicillins Hives and Rash    DID THE REACTION INVOLVE: Swelling of the  face/tongue/throat, SOB, or low BP? No Sudden or severe rash/hives, skin peeling, or the inside of the mouth or nose? No Did it require medical treatment? No When did it last happen?  90s If all above answers are "NO", may proceed with cephalosporin use.     Review of Systems  Constitutional:  Negative for chills and fever.  Eyes:  Negative for blurred vision.  Respiratory:  Negative for shortness of breath.   Cardiovascular:  Negative for chest pain.  Skin:  Positive for rash.  Neurological:  Negative for dizziness, weakness and headaches.      Objective:     BP 116/60   Pulse (!) 102   Wt 178 lb 6.4 oz (80.9 kg)   SpO2 96%   BMI 28.79 kg/m  BP Readings from Last 3 Encounters:  12/01/23 116/60  11/18/23 106/68  10/26/23 110/62   Wt Readings from Last 3 Encounters:  12/01/23 178 lb 6.4 oz (80.9 kg)  11/18/23 182 lb 4 oz (82.7 kg)  10/19/23 184 lb 6.4 oz (83.6 kg)      Physical Exam Vitals reviewed.  Constitutional:      General: She is not in acute distress.    Appearance: She is well-developed. She is not ill-appearing.  HENT:     Mouth/Throat:     Comments: Mild irritation with angular cheilitis corners of mouth bilaterally Eyes:     Pupils: Pupils are equal, round, and reactive to light.  Neck:     Thyroid : No thyromegaly.     Vascular: No JVD.  Cardiovascular:     Rate and Rhythm: Normal rate.     Heart sounds:     No gallop.  Pulmonary:     Effort: Pulmonary effort is normal. No respiratory distress.     Breath sounds: Normal breath sounds. No wheezing or rales.  Musculoskeletal:     Cervical back: Neck supple.  Skin:    Comments: Skin of lower legs is fairly dry.  She has some ichthyosis bilaterally.  No warmth.  No active cellulitis changes at this time.  Neurological:     Mental Status: She is alert.      Results for orders placed or performed in visit on 12/01/23  POC HgB A1c  Result Value Ref Range   Hemoglobin A1C 6.6 (A) 4.0 - 5.6 %    HbA1c POC (<> result, manual entry)     HbA1c, POC (prediabetic range)     HbA1c, POC (controlled diabetic range)        The ASCVD Risk score (Arnett DK, et al., 2019) failed to calculate for the following reasons:   Risk score cannot be calculated because patient has a medical history suggesting prior/existing ASCVD    Assessment & Plan:   #1 recent cellulitis left leg.  Has some chronic venous stasis changes but no active cellulitis at this time.  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  Suggested trial of over-the-counter 20% urea  cream  #3 type 2 diabetes.  A1c today 6.6%.  Discussed possible medication options but at this point she prefers to avoid additional medications and monitor.  Recheck within 6 months  #4 GERD stable on Protonix .  Refill Protonix  for 1 year  #5 angular cheilitis corners of the mouth.  Consider trial of Lotrisone  cream to use twice daily as needed  Return in about 3 months (around 03/01/2024).    Wolm Scarlet, MD

## 2023-12-03 ENCOUNTER — Ambulatory Visit: Payer: Self-pay | Admitting: Cardiology

## 2023-12-03 DIAGNOSIS — I4821 Permanent atrial fibrillation: Secondary | ICD-10-CM

## 2023-12-06 ENCOUNTER — Ambulatory Visit: Admitting: Podiatry

## 2023-12-09 NOTE — Telephone Encounter (Signed)
-----   Message from Wilbert Bihari sent at 12/03/2023  4:52 PM EDT ----- Heart monitor showed 100% afib with adequate hR control ----- Message ----- From: Bihari Wilbert SAUNDERS, MD Sent: 12/03/2023   4:50 PM EDT To: Wilbert SAUNDERS Bihari, MD

## 2023-12-09 NOTE — Telephone Encounter (Signed)
 Call to patient to advise Heart monitor showed 100% afib with adequate HR control. Patient verbalizes understanding.

## 2023-12-13 ENCOUNTER — Other Ambulatory Visit: Payer: Self-pay | Admitting: Cardiology

## 2023-12-15 ENCOUNTER — Other Ambulatory Visit: Payer: Self-pay

## 2023-12-15 DIAGNOSIS — I4821 Permanent atrial fibrillation: Secondary | ICD-10-CM

## 2023-12-15 MED ORDER — APIXABAN 5 MG PO TABS: 5.0000 mg | ORAL_TABLET | Freq: Two times a day (BID) | ORAL | 5 refills | Status: AC

## 2023-12-15 NOTE — Telephone Encounter (Signed)
 Prescription refill request for Eliquis  received. Indication:afib Last office visit:7/25 Scr:1.23  7/25 Age: 78 Weight:80.9  kg  Prescription refilled

## 2023-12-16 ENCOUNTER — Telehealth: Payer: Self-pay

## 2023-12-16 NOTE — Telephone Encounter (Signed)
 Pt message sent to  PCP for advise ?

## 2023-12-17 ENCOUNTER — Telehealth: Payer: Self-pay

## 2023-12-17 MED ORDER — CHLORHEXIDINE GLUCONATE 0.12 % MT SOLN: OROMUCOSAL | 0 refills | Status: AC

## 2023-12-17 NOTE — Telephone Encounter (Signed)
 Noted

## 2023-12-17 NOTE — Telephone Encounter (Signed)
 Rx sent please see encounter from 12/16/2023

## 2023-12-17 NOTE — Telephone Encounter (Signed)
 FYI Pt notified to call her pharmacy for Rx pick up

## 2023-12-17 NOTE — Addendum Note (Signed)
 Addended by: METTA KRISTEN CROME on: 12/17/2023 01:58 PM   Modules accepted: Orders

## 2023-12-17 NOTE — Telephone Encounter (Signed)
 Copied from CRM [id number]. Topic: Clinical - Medication Question >> Dec 13, 2023  2:14 PM Viola F wrote: Patient called, says the [facility] on file needs new script sent over with directions on how patient should use the chlorhexidine  (PERIDEX ) 0.12 % solution. Please call patient once this is sent at [phone] (M) >> Dec 17, 2023 12:10 PM Larissa S wrote: Patient states her pharmacy is needing clear instructions and frequency sent for chlorhexidine  (PERIDEX ) 0.12 % solution. Please send updated prescription to:  [id number] - [place], [place] - [address] AT [facility] [address]  [address] [place] [address]  Phone: [phone] Fax: [phone]  Hours: Open 24 hours   >> Dec 16, 2023  1:17 PM CMA Inocente POUR wrote: Pt message sent to PCP for advise

## 2023-12-20 ENCOUNTER — Ambulatory Visit: Admitting: Podiatry

## 2023-12-20 ENCOUNTER — Encounter: Payer: Self-pay | Admitting: Podiatry

## 2023-12-20 DIAGNOSIS — Z7901 Long term (current) use of anticoagulants: Secondary | ICD-10-CM

## 2023-12-20 DIAGNOSIS — I739 Peripheral vascular disease, unspecified: Secondary | ICD-10-CM

## 2023-12-20 DIAGNOSIS — M2041 Other hammer toe(s) (acquired), right foot: Secondary | ICD-10-CM

## 2023-12-20 DIAGNOSIS — B351 Tinea unguium: Secondary | ICD-10-CM

## 2023-12-20 DIAGNOSIS — E1151 Type 2 diabetes mellitus with diabetic peripheral angiopathy without gangrene: Secondary | ICD-10-CM

## 2023-12-20 DIAGNOSIS — M79675 Pain in left toe(s): Secondary | ICD-10-CM

## 2023-12-20 DIAGNOSIS — L84 Corns and callosities: Secondary | ICD-10-CM

## 2023-12-20 DIAGNOSIS — M79674 Pain in right toe(s): Secondary | ICD-10-CM | POA: Diagnosis not present

## 2023-12-20 DIAGNOSIS — M2042 Other hammer toe(s) (acquired), left foot: Secondary | ICD-10-CM

## 2023-12-20 NOTE — Progress Notes (Unsigned)
 Subjective:  Patient ID: [name], female    DOB: [birth year],  MRN: [id number]  Sayge Brienza presents to clinic today for:  Chief Complaint  Patient presents with   [facility]     RFC diabetic toenail trim. Thick discolored fungal nails and callus care.   Patient notes nails are thick and elongated, causing pain in shoe gear when ambulating.  Last A1c 3 weeks ago was 6.6.  She has painful corns on the right 2nd, 3rd and 4th toes as well as the left 2nd and 3rd toes.  She is wondering if there are any options for the hammertoes.  She gets a lot of pain in the nails and corns by the time 12 weeks rolls around for her scheduled foot and nail care.  She is wondering if she can come sooner.  PCP is Micheal Wolm ORN, MD. last seen on 12/01/2023  Past Medical History:  Diagnosis Date   ABDOMINAL PAIN RIGHT UPPER QUADRANT 11/08/2008   ALLERGIC RHINITIS 08/01/2009   Anxiety    Aortic stenosis    moderate low-flow low gradient aortic stenosis by echo 09/2023.  Mean gradient 7.8 mmHg, V-max 2.03, DVI 0.32, SVI 25, AVA 1.32 cm   Cataract    Complication of anesthesia 01/31/2015   ? doesn't remember anything after being taken to pre surgery   Coronary artery calcification seen on CAT scan 05/30/2009   moderate risk coronary calcium  score by chest CT with mid LAD plaque>>patient opted for medical management   DCM (dilated cardiomyopathy) (HCC)    EF 45-50% by echo 8/23   DDD (degenerative disc disease), lumbar    DEPRESSION 06/13/2008   DYSLIPIDEMIA 02/27/2009   DYSPNEA 02/27/2009   Dysrhythmia    Afib   Frequency of urination    GERD (gastroesophageal reflux disease)    years ago   Head injury 2016   did not loose consciousness   History of Bell's palsy    History of DVT of lower extremity 2006-- CHRONIC COUMADIN  THERAPY   History of hiatal hernia    History of Lyme disease    Hydronephrosis, left chronic -- secondary to retroperitoneal fibrosis   Hypertension    Lyme  disease    NON-HODGKIN'S LYMPHOMA, HX OF dx  2005--  chemoradiation completed 2006--  no recurrence   onocologist- dr odogwu--    OSTEOARTHRITIS, MODERATE 04/07/2010   Permanent atrial fibrillation (HCC)    On chronic anti-coagulation with warfarin   Pneumonia    x 3 - last time 02/2018   Retroperitoneal fibrosis    Stroke [facility])    ? - [place]- you have had 3 strokes   Allergies  Allergen Reactions   Chlorhexidine      Other reaction(s): Not available   Chlorhexidine  Base Itching    CHG WIPES   Clindamycin /Lincomycin Other (See Comments)    Caused fire like sensation in her stomach    Doxorubicin Hcl Swelling   Sulfa  Antibiotics Other (See Comments)    Severe GI upset   Penicillins Hives and Rash    DID THE REACTION INVOLVE: Swelling of the face/tongue/throat, SOB, or low BP? No Sudden or severe rash/hives, skin peeling, or the inside of the mouth or nose? No Did it require medical treatment? No When did it last happen?  90s If all above answers are "NO", may proceed with cephalosporin use.     Objective:  [name] is a pleasant 78 y.o. female in NAD. AAO x 3.  Vascular  Examination: Patient has palpable DP pulse, absent PT pulse bilateral.  Delayed capillary refill bilateral toes.  Sparse digital hair bilateral.  Proximal to distal cooling WNL bilateral.    Dermatological Examination: Interspaces are clear with no open lesions noted bilateral.  Skin is shiny and atrophic bilateral.  Nails are 3-34mm thick, with yellowish/brown discoloration, subungual debris and distal onycholysis x10.  There is pain with compression of nails x10.  There are hyperkeratotic lesions noted on the right 2nd, 3rd, and 4th toes, as well as the left 2nd and 3rd toes.  These are all proximal to the DIPJ.     Latest Ref Rng & Units 12/01/2023    1:25 PM 01/13/2023   12:05 PM  Hemoglobin A1C  Hemoglobin-A1c 4.0 - 5.6 % 6.6  6.5    Patient qualifies for at-risk foot care because of diabetes  with PVD.  Assessment/Plan: 1. Long term current use of anticoagulant   2. Pain due to onychomycosis of toenails of both feet   3. Corns   4. Type II diabetes mellitus with peripheral circulatory disorder (HCC)    Mycotic nails x10 were sharply debrided with sterile nail nippers and power debriding burr to decrease bulk and length.  Hyperkeratotic lesions on the bilateral 2nd and 3rd toes were shaved with #312 blade.  The fourth toe corn was sanded with the nail bur since it had minimal thickness.  She notes that she has plenty of corn pads at home for the toes.  Antibiotic ointment and a Band-Aid were applied to the left second toe following debridement.  She can remove this tonight.  We discussed a percutaneous flexor tenotomy of the right 2nd, 3rd, and 4th toes; as well as the left 2nd and 3rd toes.  These will be performed at different dates for each foot.  This would involve local anesthesia administered to the toe.  She would not have to stop the Eliquis  prior to the procedures.  A stab incision will be made on the plantar surface of the toe and the flexor tendon would be released.  1 suture would be placed and she would be able to be weightbearing afterwards.  The sutures would be removed in 1 week.  She will consider it and possibly speak to her primary care physician about this.  States that she is nervous to have any procedures performed due to being on Eliquis  and having atrial fibrillation.  Informed the patient that she could come as early as 9 weeks and still have her insurance provide coverage for the service.  Anything sooner than that would be self-pay.  Patient did inquire about the self-pay cost.  She will try 9 weeks for now  Return in about 9 weeks (around 02/21/2024) for RFC.   Awanda CHARM Imperial, DPM, FACFAS [facility]     [address] [place], [place] [address]                Office [phone]  Fax [phone]

## 2024-01-03 ENCOUNTER — Encounter: Payer: Self-pay | Admitting: Internal Medicine

## 2024-01-03 ENCOUNTER — Ambulatory Visit: Payer: Self-pay

## 2024-01-03 ENCOUNTER — Ambulatory Visit (INDEPENDENT_AMBULATORY_CARE_PROVIDER_SITE_OTHER): Admitting: Internal Medicine

## 2024-01-03 VITALS — BP 104/78 | HR 97 | Temp 98.0°F | Ht 66.0 in | Wt 171.7 lb

## 2024-01-03 DIAGNOSIS — M5441 Lumbago with sciatica, right side: Secondary | ICD-10-CM

## 2024-01-03 MED ORDER — CYCLOBENZAPRINE HCL 5 MG PO TABS: 5.0000 mg | ORAL_TABLET | Freq: Every evening | ORAL | 0 refills | Status: AC | PRN

## 2024-01-03 NOTE — Progress Notes (Signed)
 Established Patient Office Visit     CC/Reason for Visit: Acute low back pain with sciatica  HPI: Valarie Farace is a 78 y.o. female who is coming in today for the above mentioned reasons.  Has been suffering with this for the past 2 days.  Pain is centered over her right lower back radiating into her right buttock and lateral upper thigh.  No injuries that she can recall.  She has had sciatica of the left side in the past.   Past Medical/Surgical History: Past Medical History:  Diagnosis Date   ABDOMINAL PAIN RIGHT UPPER QUADRANT 11/08/2008   ALLERGIC RHINITIS 08/01/2009   Anxiety    Aortic stenosis    moderate low-flow low gradient aortic stenosis by echo 09/2023.  Mean gradient 7.8 mmHg, V-max 2.03, DVI 0.32, SVI 25, AVA 1.32 cm   Cataract    Complication of anesthesia 01/31/2015   ? doesn't remember anything after being taken to pre surgery   Coronary artery calcification seen on CAT scan 05/30/2009   moderate risk coronary calcium  score by chest CT with mid LAD plaque>>patient opted for medical management   DCM (dilated cardiomyopathy) (HCC)    EF 45-50% by echo 8/23   DDD (degenerative disc disease), lumbar    DEPRESSION 06/13/2008   DYSLIPIDEMIA 02/27/2009   DYSPNEA 02/27/2009   Dysrhythmia    Afib   Frequency of urination    GERD (gastroesophageal reflux disease)    years ago   Head injury 2016   did not loose consciousness   History of Bell's palsy    History of DVT of lower extremity 2006-- CHRONIC COUMADIN  THERAPY   History of hiatal hernia    History of Lyme disease    Hydronephrosis, left chronic -- secondary to retroperitoneal fibrosis   Hypertension    Lyme disease    NON-HODGKIN'S LYMPHOMA, HX OF dx  2005--  chemoradiation completed 2006--  no recurrence   onocologist- dr odogwu--    OSTEOARTHRITIS, MODERATE 04/07/2010   Permanent atrial fibrillation (HCC)    On chronic anti-coagulation with warfarin   Pneumonia    x 3 - last time 02/2018    Retroperitoneal fibrosis    Stroke (HCC)    ? - [place]- you have had 3 strokes    Past Surgical History:  Procedure Laterality Date   BREAST MASS EXCISION     BENIGN-- RIGHT BREAST   CARDIOVASCULAR STRESS TEST  04-04-2009-- PER PT ASYMPTOMATIC-- MEDICAL MANAGEMENT   STUDY WAS EQUIVOCAL/ EF 49%/ GLOBAL HYPOKINESIS/ APPEARS TO BE A MILD ANTERIOR PERFUSION DEFECT COULD REPRESENT ISCHEMIA BUT DUE TO  ACTIVITY WORSE IN STRESS THAN AT REST POSS. THE DEFECT WAS ARTIFACTUAL   CARDIOVERSION N/A 07/11/2015   Procedure: CARDIOVERSION;  Surgeon: Ezra GORMAN Shuck, MD;  Location: [facility] ENDOSCOPY;  Service: Cardiovascular;  Laterality: N/A;   CT ANGIOGRAM  FEB 2011   CALCIUM  SCORE 161/ POSS. MODERATE MID LAD STENOSIS   CYSTOSCOPY W/ RETROGRADES  04/28/2011   Procedure: CYSTOSCOPY WITH RETROGRADE PYELOGRAM;  Surgeon: Donnice Gwenyth Brooks, MD;  Location: [facility];  Service: Urology;  Laterality: Left;   CYSTOSCOPY W/ URETERAL STENT REMOVAL  04/28/2011   Procedure: CYSTOSCOPY WITH STENT REMOVAL;  Surgeon: Donnice Gwenyth Brooks, MD;  Location: [facility];  Service: Urology;;   EXPLORATORY LAPAROTOMY  2005   LYMPHOMA   EYE SURGERY Bilateral    cataract    GAS INSERTION Right 03/31/2018   Procedure: RIGHT EYE INSERTION OF GAS C3F8;  Surgeon: Valdemar Rogue, MD;  Location: [facility] OR;  Service: Ophthalmology;  Laterality: Right;   KNEE ARTHROSCOPY  2005   RIGHT   LASER PHOTO ABLATION Right 03/31/2018   Procedure: RIGHT EYE LASER PHOTO ABLATION;  Surgeon: Valdemar Rogue, MD;  Location: [facility] OR;  Service: Ophthalmology;  Laterality: Right;   MULTIPLE CYSTO/ LEFT URETERAL STENT EXCHANGES  LAST ONE 10-28-10   RETROPERITONEAL BX  2007   REVERSE SHOULDER ARTHROPLASTY Right 01/31/2015   Procedure: RIGHT REVERSE SHOULDER ARTHROPLASTY;  Surgeon: Franky Pointer, MD;  Location: [facility] OR;  Service: Orthopedics;  Laterality: Right;   TONSILLECTOMY  CHILD   TOTAL KNEE ARTHROPLASTY  OCT 2010   RIGHT    TOTAL KNEE ARTHROPLASTY  JAN 2010   LEFT   TRANSTHORACIC ECHOCARDIOGRAM  05-03-2009   MILD SYSTOLIC DYSFUNCTION/ EF 45-50%/ MODERATE DIASTOLIC DYSFUCTION/ MILD MR/ MILD BIATRIAL ENLARGEMENT   VITRECTOMY 25 GAUGE WITH SCLERAL BUCKLE Right 03/31/2018   Procedure: RIGHT VITRECTOMY 25 GAUGE WITH SCLERAL BUCKLE;  Surgeon: Valdemar Rogue, MD;  Location: [facility] OR;  Service: Ophthalmology;  Laterality: Right;    Social History:  reports that she quit smoking about 15 years ago. Her smoking use included cigarettes. She started smoking about 67 years ago. She has a 104 pack-year smoking history. She has never used smokeless tobacco. She reports that she does not currently use alcohol. She reports that she does not use drugs.  Allergies: Allergies  Allergen Reactions   Chlorhexidine      Other reaction(s): Not available   Chlorhexidine  Base Itching    CHG WIPES   Clindamycin /Lincomycin Other (See Comments)    Caused fire like sensation in her stomach    Doxorubicin Hcl Swelling   Sulfa  Antibiotics Other (See Comments)    Severe GI upset   Penicillins Hives and Rash    DID THE REACTION INVOLVE: Swelling of the face/tongue/throat, SOB, or low BP? No Sudden or severe rash/hives, skin peeling, or the inside of the mouth or nose? No Did it require medical treatment? No When did it last happen?  90s If all above answers are "NO", may proceed with cephalosporin use.     Family History:  Family History  Problem Relation Age of Onset   Arthritis Mother    Heart disease Mother    Heart attack Mother    Diabetes Sister    Hypertension Sister    Stroke Sister    Heart attack Sister    Liver disease Son        r/t Tylenol  use and alcoholism   Colon cancer Neg Hx    Stomach cancer Neg Hx    Pancreatic cancer Neg Hx    Esophageal cancer Neg Hx    Rectal cancer Neg Hx      Current Outpatient Medications:    ALPRAZolam  (XANAX ) 1 MG tablet, Take 1 tablet (1 mg total) by mouth 3 (three) times daily.,  Disp: 90 tablet, Rfl: 2   apixaban  (ELIQUIS ) 5 MG TABS tablet, Take 1 tablet (5 mg total) by mouth 2 (two) times daily., Disp: 60 tablet, Rfl: 5   chlorhexidine  (PERIDEX ) 0.12 % solution, Use 15 ml swish for 30 seconds and spit two times daily, Disp: 473 mL, Rfl: 0   Cholecalciferol  (VITAMIN D ) 2000 UNITS CAPS, Take 2,000 Int'l Units by mouth., Disp: , Rfl:    clotrimazole -betamethasone  (LOTRISONE ) cream, Apply 1 Application topically daily., Disp: 15 g, Rfl: 0   Coenzyme Q10 100 MG TABS, Take 100 mg by mouth daily. Pt unsure of  dose; may be 100 mg, Disp: , Rfl:    cyclobenzaprine  (FLEXERIL ) 5 MG tablet, Take 1 tablet (5 mg total) by mouth at bedtime as needed for muscle spasms., Disp: 30 tablet, Rfl: 0   diltiazem  (CARDIZEM  CD) 120 MG 24 hr capsule, TAKE 1 BY MOUTH EVERY DAY, Disp: 60 capsule, Rfl: 3   FLUAD QUADRIVALENT 0.5 ML injection, , Disp: , Rfl:    furosemide  (LASIX ) 20 MG tablet, TAKE 1 TABLET BY MOUTH EVERY DAY, Disp: 90 tablet, Rfl: 3   lidocaine  (XYLOCAINE ) 5 % ointment, Apply 1 Application topically as needed., Disp: 35.44 g, Rfl: 0   Lysine 500 MG TABS, Take 500 mg by mouth daily., Disp: , Rfl:    metoprolol  succinate (TOPROL -XL) 25 MG 24 hr tablet, Take 1 tablet (25 mg total) by mouth daily., Disp: 90 tablet, Rfl: 3   pantoprazole  (PROTONIX ) 40 MG tablet, Take 1 tablet (40 mg total) by mouth daily., Disp: 90 tablet, Rfl: 3   Pitavastatin  Calcium  (LIVALO ) 2 MG TABS, Take 1 tablet (2 mg total) by mouth daily., Disp: 90 tablet, Rfl: 1   potassium chloride  (KLOR-CON ) 20 MEQ packet, TAKE 1 PACKET BY MOUTH DAILY, Disp: 100 each, Rfl: 3   urea  (CARMOL) 20 % cream, Apply topically as needed., Disp: 85 g, Rfl: 1   valACYclovir  (VALTREX ) 1000 MG tablet, TAKE 2 TABLETS AT ONSET OF COLD SORES AND REPEAT 2 IN 12 HOURS., Disp: 30 tablet, Rfl: 0  Review of Systems:  Negative unless indicated in HPI.   Physical Exam: Vitals:   01/03/24 1602  BP: 104/78  Pulse: 97  Temp: 98 F (36.7 C)   TempSrc: Oral  SpO2: 97%  Weight: 171 lb 11.2 oz (77.9 kg)  Height: 5' 6 (1.676 m)    Body mass index is 27.71 kg/m.    Impression and Plan:  Acute right-sided low back pain with right-sided sciatica -     Cyclobenzaprine  HCl; Take 1 tablet (5 mg total) by mouth at bedtime as needed for muscle spasms.  Dispense: 30 tablet; Refill: 0   - Advised conservative measures such as stretches, icing, local massage therapy. - Will send some muscle relaxers to use at bedtime for the next week or so. - Follow-up with PCP if no improvement.  Time spent:22 minutes reviewing chart, interviewing and examining patient and formulating plan of care.     Tully Theophilus Andrews, MD [facility]

## 2024-01-03 NOTE — Telephone Encounter (Signed)
 FYI Only or Action Required?: FYI only for provider.  Patient was last seen in primary care on 12/01/2023 by Micheal Wolm ORN, MD.  Called Nurse Triage reporting Back Pain.  Symptoms began yesterday.  Interventions attempted: Ice/heat application.  Symptoms are: unchanged.  Triage Disposition: See HCP Within 4 Hours (Or PCP Triage)  Patient/caregiver understands and will follow disposition?: Yes   Copied from CRM [id number]. Topic: Clinical - Red Word Triage >> Jan 03, 2024 10:13 AM Joesph NOVAK wrote: Red Word that prompted transfer to Nurse Triage: sciatica pain, she has not been able to walk with her walker. Has been going on for a couple days. Reason for Disposition  [1] SEVERE back pain (e.g., excruciating, unable to do any normal activities) AND [2] not improved 2 hours after pain medicine  Answer Assessment - Initial Assessment Questions Patient reports having pain yesterday that has gotten worse today to the right buttock down the right thigh. She says she's been having to use a walker in the home, not normally what she does. She uses a walker outside. She's used heat, no medications for the pain. Myles HARPER, PCP available tomorrow. She says her son passed away and would like Dr. Micheal to know. She says tomorrow is not good. Scheduled today with Dr. Theophilus.    1. ONSET: When did the pain begin? (e.g., minutes, hours, days)     Yesterday it started to get worse  2. LOCATION: Where does it hurt? (upper, mid or lower back)     Right Buttock  3. SEVERITY: How bad is the pain?  (e.g., Scale 1-10; mild, moderate, or severe)     9  4. PATTERN: Is the pain constant? (e.g., yes, no; constant, intermittent)      Constant  5. RADIATION: Does the pain shoot into your legs or somewhere else?     Down right leg  6. CAUSE:  What do you think is causing the back pain?      Sciatica   8. MEDICINES: What have you taken so far for the pain? (e.g., nothing,  acetaminophen , NSAIDS)     Nothing  9. NEUROLOGIC SYMPTOMS: Do you have any weakness, numbness, or problems with bowel/bladder control?     Weakness, hard to walk with walker  10. OTHER SYMPTOMS: Do you have any other symptoms? (e.g., fever, abdomen pain, burning with urination, blood in urine)       No  Protocols used: Back Pain-A-AH

## 2024-01-04 ENCOUNTER — Telehealth: Payer: Self-pay

## 2024-01-04 NOTE — Telephone Encounter (Signed)
 Copied from CRM [id number]. Topic: Clinical - Medication Question >> Jan 04, 2024  9:35 AM Dedra B wrote: Reason for CRM: Pt was prescribed flexeril  for her sciatica. She feels she shouldn't take flexeril  because the pamphlet said people with abnormal heartbeats or who take anxiety meds shouldn't take it. Pt wants to know if something else can be prescribed for her sciatica. Pls call pt and send prescription to [facility] on [address].

## 2024-01-04 NOTE — Telephone Encounter (Signed)
 I spoke with the patient and she reported she would like PCP to advise on medication as he knows her history and she would feel better to get his recommendation.

## 2024-01-05 ENCOUNTER — Telehealth: Payer: Self-pay

## 2024-01-05 MED ORDER — PREDNISONE 20 MG PO TABS: ORAL_TABLET | ORAL | 0 refills | Status: DC

## 2024-01-05 NOTE — Telephone Encounter (Signed)
 Please see previous encounter

## 2024-01-05 NOTE — Progress Notes (Signed)
 [name]                                          MRN: [id number]   01/05/2024   The [facility] Quality Team Specialist reviewed this patient medical record for the purposes of chart review for care gap closure. The following were reviewed: chart review for care gap closure-kidney health evaluation for diabetes:eGFR  and uACR.    [facility] Quality Team

## 2024-01-05 NOTE — Addendum Note (Signed)
 Addended by: METTA KRISTEN CROME on: 01/05/2024 10:38 AM   Modules accepted: Orders

## 2024-01-05 NOTE — Telephone Encounter (Signed)
 Rx done and patient informed of message below and voiced understanding

## 2024-01-05 NOTE — Telephone Encounter (Signed)
 Left a message for the patient to return my call.

## 2024-01-05 NOTE — Telephone Encounter (Signed)
 Copied from CRM [id number]. Topic: General - Other >> Jan 05, 2024 10:17 AM Laymon HERO wrote: Reason for CRM: Patient returning call from Sergio Zawislak L, CMA

## 2024-01-05 NOTE — Addendum Note (Signed)
 Addended by: METTA KRISTEN CROME on: 01/05/2024 10:15 AM   Modules accepted: Orders

## 2024-01-10 ENCOUNTER — Telehealth: Payer: Self-pay | Admitting: Cardiology

## 2024-01-10 ENCOUNTER — Other Ambulatory Visit: Payer: Self-pay | Admitting: Family Medicine

## 2024-01-10 NOTE — Telephone Encounter (Signed)
 Patient identification verified by 2 forms.  Pt states she has found the bottle of medication.

## 2024-01-10 NOTE — Telephone Encounter (Signed)
 Pt c/o medication issue:  1. Name of Medication:   metoprolol  succinate (TOPROL -XL) 25 MG 24 hr tablet    2. How are you currently taking this medication (dosage and times per day)? As written  3. Are you having a reaction (difficulty breathing--STAT)? No   4. What is your medication issue? Pt cannot find her bottle of medication. She does have Cardizem  and wants to know if she can take this until she can get someone to take her to the pharmacy. Please advise

## 2024-01-13 ENCOUNTER — Ambulatory Visit: Admitting: Family Medicine

## 2024-01-13 ENCOUNTER — Encounter: Payer: Self-pay | Admitting: Family Medicine

## 2024-01-13 ENCOUNTER — Ambulatory Visit: Payer: Self-pay

## 2024-01-13 VITALS — BP 102/78 | HR 87 | Temp 98.6°F | Ht 66.0 in | Wt 170.4 lb

## 2024-01-13 DIAGNOSIS — I10 Essential (primary) hypertension: Secondary | ICD-10-CM

## 2024-01-13 DIAGNOSIS — I4821 Permanent atrial fibrillation: Secondary | ICD-10-CM | POA: Diagnosis not present

## 2024-01-13 DIAGNOSIS — I5032 Chronic diastolic (congestive) heart failure: Secondary | ICD-10-CM

## 2024-01-13 DIAGNOSIS — R42 Dizziness and giddiness: Secondary | ICD-10-CM | POA: Diagnosis not present

## 2024-01-13 DIAGNOSIS — F4321 Adjustment disorder with depressed mood: Secondary | ICD-10-CM

## 2024-01-13 NOTE — Progress Notes (Signed)
 Established Patient Office Visit   Subjective  Patient ID: [name], female    DOB: January 11, 1946  Age: 78 y.o. MRN: [id number]  Chief Complaint  Patient presents with   Acute Visit    Dizziness when standing,  Pt accompanied by her former neighbor.  Pt is a 78 yo female followed by Dr. Micheal and seen for acute concern.  Pt seen for back pain/sciatica given flexeril  01/03/24.  Pt called office on 10/14, per chart review stating she didn't want to take it out of concern due to her chronic conditions.  Pt was given an rx for prednisone  taper by her PCP on 10/15.  Pt states 4 days after starting med she became dizzy.  Pt stopped taking the taper with 2 days left.  Pt dizzy with standing.  BP typically low 100-1teens/80 at home.  No dizziness with going to restroom at night.  Pt notes decreased appetite and does not drink water .  Dealing with increased stress as her son who was her caregiver died on 01/15/2024.  Pt will be moving to [place] with her other son at the beginning of Nov.      Patient Active Problem List   Diagnosis Date Noted   Type 2 diabetes mellitus with diabetic chronic kidney disease (HCC) 01/13/2023   CKD (chronic kidney disease) stage 3, GFR 30-59 ml/min (HCC) 10/21/2021   Aortic stenosis 09/08/2020   Loss of weight 06/05/2020   Pain in left foot 07/03/2019   Chronic low back pain 01/16/2017   Encounter for therapeutic drug monitoring 12/11/2016   Acute encephalopathy 02/03/2015   Hematoma of right parietal scalp 02/03/2015   Anticoagulated on Coumadin  02/03/2015   Anemia 02/03/2015   Status post reverse total shoulder replacement 01/31/2015   Chronic diastolic CHF (congestive heart failure) (HCC) 06/10/2014   Anxiety state 04/23/2014   Permanent atrial fibrillation (HCC) 11/13/2013   Mitral stenosis 11/13/2013   Obesity (BMI 30-39.9) 11/30/2012   Essential hypertension 11/03/2012   DCM (dilated cardiomyopathy) (HCC) 09/16/2011   DVT of lower extremity (deep  venous thrombosis) (HCC) 01/22/2011   Osteoarthritis 04/07/2010   Allergic rhinitis 08/01/2009   Coronary artery calcification seen on CAT scan 05/30/2009   Nonspecific abnormal results of cardiovascular function study 05/01/2009   HLD (hyperlipidemia) 02/27/2009   DYSPNEA 02/27/2009   ABDOMINAL PAIN RIGHT UPPER QUADRANT 11/08/2008   NHL (non-Hodgkin's lymphoma) (HCC) 11/08/2008   Depression, major, in partial remission (HCC) 06/13/2008   FATIGUE 05/30/2008   Past Medical History:  Diagnosis Date   ABDOMINAL PAIN RIGHT UPPER QUADRANT 11/08/2008   ALLERGIC RHINITIS 08/01/2009   Anxiety    Aortic stenosis    moderate low-flow low gradient aortic stenosis by echo 09/2023.  Mean gradient 7.8 mmHg, V-max 2.03, DVI 0.32, SVI 25, AVA 1.32 cm   Cataract    Complication of anesthesia 01/31/2015   ? doesn't remember anything after being taken to pre surgery   Coronary artery calcification seen on CAT scan 05/30/2009   moderate risk coronary calcium  score by chest CT with mid LAD plaque>>patient opted for medical management   DCM (dilated cardiomyopathy) (HCC)    EF 45-50% by echo 8/23   DDD (degenerative disc disease), lumbar    DEPRESSION 06/13/2008   DYSLIPIDEMIA 02/27/2009   DYSPNEA 02/27/2009   Dysrhythmia    Afib   Frequency of urination    GERD (gastroesophageal reflux disease)    years ago   Head injury 2016   did not loose consciousness  History of Bell's palsy    History of DVT of lower extremity 2006-- CHRONIC COUMADIN  THERAPY   History of hiatal hernia    History of Lyme disease    Hydronephrosis, left chronic -- secondary to retroperitoneal fibrosis   Hypertension    Lyme disease    NON-HODGKIN'S LYMPHOMA, HX OF dx  2005--  chemoradiation completed 2006--  no recurrence   onocologist- dr odogwu--    OSTEOARTHRITIS, MODERATE 04/07/2010   Permanent atrial fibrillation (HCC)    On chronic anti-coagulation with warfarin   Pneumonia    x 3 - last time 02/2018    Retroperitoneal fibrosis    Stroke (HCC)    ? - [place]- you have had 3 strokes   Past Surgical History:  Procedure Laterality Date   BREAST MASS EXCISION     BENIGN-- RIGHT BREAST   CARDIOVASCULAR STRESS TEST  04-04-2009-- PER PT ASYMPTOMATIC-- MEDICAL MANAGEMENT   STUDY WAS EQUIVOCAL/ EF 49%/ GLOBAL HYPOKINESIS/ APPEARS TO BE A MILD ANTERIOR PERFUSION DEFECT COULD REPRESENT ISCHEMIA BUT DUE TO  ACTIVITY WORSE IN STRESS THAN AT REST POSS. THE DEFECT WAS ARTIFACTUAL   CARDIOVERSION N/A 07/11/2015   Procedure: CARDIOVERSION;  Surgeon: Ezra GORMAN Shuck, MD;  Location: [facility] ENDOSCOPY;  Service: Cardiovascular;  Laterality: N/A;   CT ANGIOGRAM  FEB 2011   CALCIUM  SCORE 161/ POSS. MODERATE MID LAD STENOSIS   CYSTOSCOPY W/ RETROGRADES  04/28/2011   Procedure: CYSTOSCOPY WITH RETROGRADE PYELOGRAM;  Surgeon: Donnice Gwenyth Brooks, MD;  Location: [facility];  Service: Urology;  Laterality: Left;   CYSTOSCOPY W/ URETERAL STENT REMOVAL  04/28/2011   Procedure: CYSTOSCOPY WITH STENT REMOVAL;  Surgeon: Donnice Gwenyth Brooks, MD;  Location: [facility];  Service: Urology;;   EXPLORATORY LAPAROTOMY  2005   LYMPHOMA   EYE SURGERY Bilateral    cataract    GAS INSERTION Right 03/31/2018   Procedure: RIGHT EYE INSERTION OF GAS C3F8;  Surgeon: Valdemar Rogue, MD;  Location: [facility] OR;  Service: Ophthalmology;  Laterality: Right;   KNEE ARTHROSCOPY  2005   RIGHT   LASER PHOTO ABLATION Right 03/31/2018   Procedure: RIGHT EYE LASER PHOTO ABLATION;  Surgeon: Valdemar Rogue, MD;  Location: [facility] OR;  Service: Ophthalmology;  Laterality: Right;   MULTIPLE CYSTO/ LEFT URETERAL STENT EXCHANGES  LAST ONE 10-28-10   RETROPERITONEAL BX  2007   REVERSE SHOULDER ARTHROPLASTY Right 01/31/2015   Procedure: RIGHT REVERSE SHOULDER ARTHROPLASTY;  Surgeon: Franky Pointer, MD;  Location: [facility] OR;  Service: Orthopedics;  Laterality: Right;   TONSILLECTOMY  CHILD   TOTAL KNEE ARTHROPLASTY  OCT 2010   RIGHT    TOTAL KNEE ARTHROPLASTY  JAN 2010   LEFT   TRANSTHORACIC ECHOCARDIOGRAM  05-03-2009   MILD SYSTOLIC DYSFUNCTION/ EF 45-50%/ MODERATE DIASTOLIC DYSFUCTION/ MILD MR/ MILD BIATRIAL ENLARGEMENT   VITRECTOMY 25 GAUGE WITH SCLERAL BUCKLE Right 03/31/2018   Procedure: RIGHT VITRECTOMY 25 GAUGE WITH SCLERAL BUCKLE;  Surgeon: Valdemar Rogue, MD;  Location: [facility] OR;  Service: Ophthalmology;  Laterality: Right;   Social History   Tobacco Use   Smoking status: Former    Current packs/day: 0.00    Average packs/day: 2.0 packs/day for 52.0 years (104.0 ttl pk-yrs)    Types: Cigarettes    Start date: 03/24/1956    Quit date: 03/24/2008    Years since quitting: 15.8   Smokeless tobacco: Never  Vaping Use   Vaping status: Never Used  Substance Use Topics   Alcohol use: Not Currently  Comment: RARE   Drug use: No   Family History  Problem Relation Age of Onset   Arthritis Mother    Heart disease Mother    Heart attack Mother    Diabetes Sister    Hypertension Sister    Stroke Sister    Heart attack Sister    Liver disease Son        r/t Tylenol  use and alcoholism   Colon cancer Neg Hx    Stomach cancer Neg Hx    Pancreatic cancer Neg Hx    Esophageal cancer Neg Hx    Rectal cancer Neg Hx    Allergies  Allergen Reactions   Chlorhexidine      Other reaction(s): Not available   Chlorhexidine  Base Itching    CHG WIPES   Clindamycin /Lincomycin Other (See Comments)    Caused fire like sensation in her stomach    Doxorubicin Hcl Swelling   Sulfa  Antibiotics Other (See Comments)    Severe GI upset   Penicillins Hives and Rash    DID THE REACTION INVOLVE: Swelling of the face/tongue/throat, SOB, or low BP? No Sudden or severe rash/hives, skin peeling, or the inside of the mouth or nose? No Did it require medical treatment? No When did it last happen?  90s If all above answers are "NO", may proceed with cephalosporin use.     ROS Negative unless stated above    Objective:     BP  102/78 (BP Location: Left Arm, Patient Position: Sitting, Cuff Size: Normal)   Pulse 87   Temp 98.6 F (37 C) (Oral)   Ht 5' 6 (1.676 m)   Wt 170 lb 6.4 oz (77.3 kg)   SpO2 98%   BMI 27.50 kg/m  BP Readings from Last 3 Encounters:  01/13/24 102/78  01/03/24 104/78  12/01/23 116/60   Wt Readings from Last 3 Encounters:  01/13/24 170 lb 6.4 oz (77.3 kg)  01/03/24 171 lb 11.2 oz (77.9 kg)  12/01/23 178 lb 6.4 oz (80.9 kg)      Physical Exam Constitutional:      General: She is not in acute distress.    Appearance: Normal appearance.     Comments: Sitting in transport wheelchair.  HENT:     Head: Normocephalic and atraumatic.     Nose: Nose normal.     Mouth/Throat:     Mouth: Mucous membranes are moist.  Eyes:     Extraocular Movements: Extraocular movements intact.     Right eye: No nystagmus.     Left eye: No nystagmus.     Conjunctiva/sclera: Conjunctivae normal.  Cardiovascular:     Rate and Rhythm: Normal rate. Rhythm irregular.     Heart sounds: Normal heart sounds. No murmur heard.    No gallop.  Pulmonary:     Effort: Pulmonary effort is normal. No respiratory distress.     Breath sounds: Normal breath sounds. No wheezing, rhonchi or rales.  Skin:    General: Skin is warm and dry.  Neurological:     Mental Status: She is alert and oriented to person, place, and time.     Comments: Gait not assessed.        10/04/2023    3:07 PM 07/19/2023    2:42 PM 07/23/2022    4:22 PM  Depression screen PHQ 2/9  Decreased Interest 0 0 0  Down, Depressed, Hopeless 0 0 3  PHQ - 2 Score 0 0 3  Altered sleeping 0  0  Tired, decreased  energy 2  1  Change in appetite 0  0  Feeling bad or failure about yourself  0  0  Trouble concentrating 0  0  Moving slowly or fidgety/restless 0  0  Suicidal thoughts 0  0  PHQ-9 Score 2  4  Difficult doing work/chores Not difficult at all  Not difficult at all      07/23/2022    4:22 PM  GAD 7 : Generalized Anxiety Score   Nervous, Anxious, on Edge 1  Control/stop worrying 1  Worry too much - different things 0  Trouble relaxing 0  Restless 0  Easily annoyed or irritable 0  Afraid - awful might happen 0  Total GAD 7 Score 2  Anxiety Difficulty Not difficult at all     No results found for any visits on 01/13/24.    Assessment & Plan:   Dizziness  Grief  Permanent atrial fibrillation (HCC)  Essential hypertension  Chronic diastolic CHF (congestive heart failure) (HCC)  Acute dizziness likely multifactorial including recent prednisone  taper and dehydration.  Also consider symptoms due to CV history.   Advised will take time for prednisone  to fully clear her system.  Patient encouraged to increase intake of water  and decrease intake of tea and other beverages.  Advised to monitor intake given history of CHF to avoid fluid overload.  Continue current medications including Eliquis , Toprol  XL 25 mg daily, Lasix  20 mg daily, Protonix  40 mg daily.  Recent stress may also be contributing.  Patient encouraged to consider counseling given recent grief and history of anxiety.  Patient open to the idea, will look into after relocation.  Xanax  TID prn, can help if sx due to vertigo. . Given strict precautions.  Return if symptoms worsen or fail to improve.    Clotilda JONELLE Single, MD

## 2024-01-13 NOTE — Telephone Encounter (Signed)
 FYI Only or Action Required?: FYI only for provider.  Patient was last seen in primary care on 01/03/2024 by [name], Tully GRADE, MD.  Called Nurse Triage reporting Dizziness.  Symptoms began several days ago.  Interventions attempted: Rest, hydration, or home remedies.  Symptoms are: unchanged.  Triage Disposition: See Physician Within 24 Hours  Patient/caregiver understands and will follow disposition?: Yes  Copied from CRM #[id number]. Topic: Clinical - Red Word Triage >> Jan 13, 2024 10:54 AM Thersia BROCKS wrote: [facility] that prompted transfer to Nurse Triage: patient stated she was prescribed medication, predniSONE  (DELTASONE ) 20 MG tablet has been taking it and is very dizzy not sure what is going . She stated she is very stressed , by her self and dont know what to do Reason for Disposition  [1] MODERATE dizziness (e.g., interferes with normal activities) AND [2] has NOT been evaluated by doctor (or NP/PA) for this  (Exception: Dizziness caused by heat exposure, sudden standing, or poor fluid intake.)  Answer Assessment - Initial Assessment Questions Reports decreased appetite, increased stress over recent loss of son, planned sale of her home and upcoming move to [place] .   1. DESCRIPTION: Describe your dizziness.     Dizziness on standing and while walking No dizziness while sleeping, or when she wakes up at night to go to the bathroom 2. LIGHTHEADED: Do you feel lightheaded? (e.g., somewhat faint, woozy, weak upon standing)     Dizzy on standing 3. VERTIGO: Do you feel like either you or the room is spinning or tilting? (i.e., vertigo)     Reports head spinning 4. SEVERITY: How bad is it?  Do you feel like you are going to faint? Can you stand and walk?     denies 5. ONSET:  When did the dizziness begin?     4 days ago 6. AGGRAVATING FACTORS: Does anything make it worse? (e.g., standing, change in head position)     Standing/walking  8. CAUSE:  What do you think is causing the dizziness? (e.g., decreased fluids or food, diarrhea, emotional distress, heat exposure, new medicine, sudden standing, vomiting; unknown)     unsure 9. RECURRENT SYMPTOM: Have you had dizziness before? If Yes, ask: When was the last time? What happened that time?     No history of dizziness or vertigo 10. OTHER SYMPTOMS: Do you have any other symptoms? (e.g., fever, chest pain, vomiting, diarrhea, bleeding)       denies 11. PREGNANCY: Is there any chance you are pregnant? When was your last menstrual period?       N/a  Protocols used: Dizziness - Lightheadedness-A-AH

## 2024-01-14 ENCOUNTER — Other Ambulatory Visit: Payer: Self-pay | Admitting: Cardiology

## 2024-01-17 ENCOUNTER — Ambulatory Visit: Payer: Self-pay

## 2024-01-17 NOTE — Telephone Encounter (Signed)
 FYI Only or Action Required?: FYI only for provider.  Patient was last seen in primary care on 01/13/2024 by Angela Clotilda SAUNDERS, MD.  Called Nurse Triage reporting Dizziness.  Symptoms began a week ago.  Interventions attempted: Nothing.  Symptoms are: unchanged.  Triage Disposition: See PCP Within 2 Weeks  Patient/caregiver understands and will follow disposition?: Yes                            Copied from CRM [id number]. Topic: Clinical - Red Word Triage >> Jan 17, 2024  1:02 PM [name] wrote: [facility] that prompted transfer to Nurse Triage: Dizziness for several days, was prescribed predniSONE  (DELTASONE ) 20 MG tablet which made her dizzy and still having issues with dizziness, going through personal issues as well, son passed away and has to move to another states, patient states she's scared Reason for Disposition  [1] MILD dizziness (e.g., walking normally) AND [2] has been evaluated by doctor (or NP/PA) for this  Answer Assessment - Initial Assessment Questions 1. DESCRIPTION: Describe your dizziness.     Spinning in forehead at first, now it is not as bad, states she feels that something is not right, states dizziness is only present when walking 2. LIGHTHEADED: Do you feel lightheaded? (e.g., somewhat faint, woozy, weak upon standing)     Denies feeling lightheaded 3. VERTIGO: Do you feel like either you or the room is spinning or tilting? (i.e., vertigo)     Denies 4. SEVERITY: How bad is it?  Do you feel like you are going to faint? Can you stand and walk?     Mild, states she uses a cane and a walker because she is afraid of falling 5. ONSET:  When did the dizziness begin?     About a week ago 6. AGGRAVATING FACTORS: Does anything make it worse? (e.g., standing, change in head position)     Walking 7. HEART RATE: Can you tell me your heart rate? How many beats in 15 seconds?  (Note: Not all patients can do this.)        N/A 8. CAUSE: What do you think is causing the dizziness? (e.g., decreased fluids or food, diarrhea, emotional distress, heat exposure, new medicine, sudden standing, vomiting; unknown)     Prednisone  taper, patient expresses that she believes something else is going one 9. RECURRENT SYMPTOM: Have you had dizziness before? If Yes, ask: When was the last time? What happened that time?     Denies  10. OTHER SYMPTOMS: Do you have any other symptoms? (e.g., fever, chest pain, vomiting, diarrhea, bleeding)     Patient was noticeably anxious while on phone with this RN, patient expresses stress related to son's recent death and upcoming move to [place] , difficulty sleeping, denies headache, denies one-sided weakness, denies changes to speech, denies facial drooping, denies sudden vision changes, denies fever 11. PREGNANCY: Is there any chance you are pregnant? When was your last menstrual period?     N/A    Patient was evaluated in office for symptoms on 01/13/24. Provider determined that symptoms were related to prednisone  taper. Patient was noticeably anxious about dizziness and expressed concern that dizziness has not gone away. Patient specifically requested an appointment with PCP regarding symptoms. This RN scheduled patient with PCP for Wednesday of this week.  Protocols used: Dizziness - Lightheadedness-A-AH

## 2024-01-18 MED ORDER — PITAVASTATIN CALCIUM 2 MG PO TABS: 1.0000 | ORAL_TABLET | Freq: Every day | ORAL | 3 refills | Status: AC

## 2024-01-19 ENCOUNTER — Encounter: Payer: Self-pay | Admitting: Family Medicine

## 2024-01-19 ENCOUNTER — Ambulatory Visit: Payer: Self-pay | Admitting: Family Medicine

## 2024-01-19 ENCOUNTER — Ambulatory Visit: Admitting: Family Medicine

## 2024-01-19 VITALS — BP 94/62 | HR 81 | Temp 97.8°F

## 2024-01-19 DIAGNOSIS — R252 Cramp and spasm: Secondary | ICD-10-CM

## 2024-01-19 DIAGNOSIS — I951 Orthostatic hypotension: Secondary | ICD-10-CM | POA: Diagnosis not present

## 2024-01-19 LAB — COMPREHENSIVE METABOLIC PANEL WITH GFR
ALT: 12 U/L (ref 0–35)
AST: 13 U/L (ref 0–37)
Albumin: 3.8 g/dL (ref 3.5–5.2)
Alkaline Phosphatase: 76 U/L (ref 39–117)
BUN: 25 mg/dL — ABNORMAL HIGH (ref 6–23)
CO2: 26 meq/L (ref 19–32)
Calcium: 9 mg/dL (ref 8.4–10.5)
Chloride: 106 meq/L (ref 96–112)
Creatinine, Ser: 0.85 mg/dL (ref 0.40–1.20)
GFR: 64.96 mL/min (ref >60.00)
Glucose, Bld: 132 mg/dL — ABNORMAL HIGH (ref 70–99)
Potassium: 4.2 meq/L (ref 3.5–5.1)
Sodium: 140 meq/L (ref 135–145)
Total Bilirubin: 0.6 mg/dL (ref 0.2–1.2)
Total Protein: 6.1 g/dL (ref 6.0–8.3)

## 2024-01-19 LAB — MAGNESIUM: Magnesium: 2.1 mg/dL (ref 1.5–2.5)

## 2024-01-19 NOTE — Progress Notes (Signed)
 Established Patient Office Visit  Subjective   Patient ID: [name], female    DOB: 22-Mar-1946  Age: 78 y.o. MRN: [id number]  Chief Complaint  Patient presents with   Dizziness    HPI    Ms. [name] is seen today with chief complaint of some dizziness.  She has noticed this over the past few days with standing.  She was seen recently with back pain and prescribed Flexeril  but basically never took this because of concerns about side effects.  She did take a brief course of prednisone  and has finished that up.  She was seen here on the 23rd with dizziness and it was felt that her symptoms may have been related to the prednisone .  She was apparently taken off diltiazem  not too long ago but from her cardiologist because of low blood pressures.  She remains on low-dose metoprolol  and also remains on Eliquis  for history of A-fib.  Recent CBC in July hemoglobin 12.5.  She had recent A1c 6.6%.  Her lightheadedness and dizziness is consistently with standing and does improve some after she stands for a while.  She has having some intermittent leg cramps and wonders if she has some electrolyte disturbance.  50 year old son recently died from complications of liver failure.  She is coping fairly well overall.  She plans to moved to [place]  soon to live with other son.  Ms. [name] has chronic problems including chronic diastolic heart failure, history of CAD, hypertension, atrial fibrillation, type 2 diabetes, recurrent depression, chronic anxiety, remote history of non-Hodgkin lymphoma She has been on alprazolam  for years.  No recent new medications.  Also remains on Lasix  20 mg daily.  No recent increased peripheral edema issues.  She takes low-dose potassium replacement as well.  Past Medical History:  Diagnosis Date   ABDOMINAL PAIN RIGHT UPPER QUADRANT 11/08/2008   ALLERGIC RHINITIS 08/01/2009   Anxiety    Aortic stenosis    moderate low-flow low gradient aortic stenosis by echo  09/2023.  Mean gradient 7.8 mmHg, V-max 2.03, DVI 0.32, SVI 25, AVA 1.32 cm   Cataract    Complication of anesthesia 01/31/2015   ? doesn't remember anything after being taken to pre surgery   Coronary artery calcification seen on CAT scan 05/30/2009   moderate risk coronary calcium  score by chest CT with mid LAD plaque>>patient opted for medical management   DCM (dilated cardiomyopathy) (HCC)    EF 45-50% by echo 8/23   DDD (degenerative disc disease), lumbar    DEPRESSION 06/13/2008   DYSLIPIDEMIA 02/27/2009   DYSPNEA 02/27/2009   Dysrhythmia    Afib   Frequency of urination    GERD (gastroesophageal reflux disease)    years ago   Head injury 2016   did not loose consciousness   History of Bell's palsy    History of DVT of lower extremity 2006-- CHRONIC COUMADIN  THERAPY   History of hiatal hernia    History of Lyme disease    Hydronephrosis, left chronic -- secondary to retroperitoneal fibrosis   Hypertension    Lyme disease    NON-HODGKIN'S LYMPHOMA, HX OF dx  2005--  chemoradiation completed 2006--  no recurrence   onocologist- dr odogwu--    OSTEOARTHRITIS, MODERATE 04/07/2010   Permanent atrial fibrillation (HCC)    On chronic anti-coagulation with warfarin   Pneumonia    x 3 - last time 02/2018   Retroperitoneal fibrosis    Stroke [facility])    ? - [place]- you have  had 3 strokes   Past Surgical History:  Procedure Laterality Date   BREAST MASS EXCISION     BENIGN-- RIGHT BREAST   CARDIOVASCULAR STRESS TEST  04-04-2009-- PER PT ASYMPTOMATIC-- MEDICAL MANAGEMENT   STUDY WAS EQUIVOCAL/ EF 49%/ GLOBAL HYPOKINESIS/ APPEARS TO BE A MILD ANTERIOR PERFUSION DEFECT COULD REPRESENT ISCHEMIA BUT DUE TO  ACTIVITY WORSE IN STRESS THAN AT REST POSS. THE DEFECT WAS ARTIFACTUAL   CARDIOVERSION N/A 07/11/2015   Procedure: CARDIOVERSION;  Surgeon: Ezra GORMAN Shuck, MD;  Location: [facility] ENDOSCOPY;  Service: Cardiovascular;  Laterality: N/A;   CT ANGIOGRAM  FEB 2011   CALCIUM  SCORE 161/  POSS. MODERATE MID LAD STENOSIS   CYSTOSCOPY W/ RETROGRADES  04/28/2011   Procedure: CYSTOSCOPY WITH RETROGRADE PYELOGRAM;  Surgeon: Donnice Gwenyth Brooks, MD;  Location: [facility];  Service: Urology;  Laterality: Left;   CYSTOSCOPY W/ URETERAL STENT REMOVAL  04/28/2011   Procedure: CYSTOSCOPY WITH STENT REMOVAL;  Surgeon: Donnice Gwenyth Brooks, MD;  Location: [facility];  Service: Urology;;   EXPLORATORY LAPAROTOMY  2005   LYMPHOMA   EYE SURGERY Bilateral    cataract    GAS INSERTION Right 03/31/2018   Procedure: RIGHT EYE INSERTION OF GAS C3F8;  Surgeon: Valdemar Rogue, MD;  Location: [facility] OR;  Service: Ophthalmology;  Laterality: Right;   KNEE ARTHROSCOPY  2005   RIGHT   LASER PHOTO ABLATION Right 03/31/2018   Procedure: RIGHT EYE LASER PHOTO ABLATION;  Surgeon: Valdemar Rogue, MD;  Location: [facility] OR;  Service: Ophthalmology;  Laterality: Right;   MULTIPLE CYSTO/ LEFT URETERAL STENT EXCHANGES  LAST ONE 10-28-10   RETROPERITONEAL BX  2007   REVERSE SHOULDER ARTHROPLASTY Right 01/31/2015   Procedure: RIGHT REVERSE SHOULDER ARTHROPLASTY;  Surgeon: Franky Pointer, MD;  Location: [facility] OR;  Service: Orthopedics;  Laterality: Right;   TONSILLECTOMY  CHILD   TOTAL KNEE ARTHROPLASTY  OCT 2010   RIGHT   TOTAL KNEE ARTHROPLASTY  JAN 2010   LEFT   TRANSTHORACIC ECHOCARDIOGRAM  05-03-2009   MILD SYSTOLIC DYSFUNCTION/ EF 45-50%/ MODERATE DIASTOLIC DYSFUCTION/ MILD MR/ MILD BIATRIAL ENLARGEMENT   VITRECTOMY 25 GAUGE WITH SCLERAL BUCKLE Right 03/31/2018   Procedure: RIGHT VITRECTOMY 25 GAUGE WITH SCLERAL BUCKLE;  Surgeon: Valdemar Rogue, MD;  Location: [facility] OR;  Service: Ophthalmology;  Laterality: Right;    reports that she quit smoking about 15 years ago. Her smoking use included cigarettes. She started smoking about 67 years ago. She has a 104 pack-year smoking history. She has never used smokeless tobacco. She reports that she does not currently use alcohol. She reports that she  does not use drugs. family history includes Arthritis in her mother; Diabetes in her sister; Heart attack in her mother and sister; Heart disease in her mother; Hypertension in her sister; Liver disease in her son; Stroke in her sister. Allergies  Allergen Reactions   Chlorhexidine      Other reaction(s): Not available   Chlorhexidine  Base Itching    CHG WIPES   Clindamycin /Lincomycin Other (See Comments)    Caused fire like sensation in her stomach    Doxorubicin Hcl Swelling   Sulfa  Antibiotics Other (See Comments)    Severe GI upset   Penicillins Hives and Rash    DID THE REACTION INVOLVE: Swelling of the face/tongue/throat, SOB, or low BP? No Sudden or severe rash/hives, skin peeling, or the inside of the mouth or nose? No Did it require medical treatment? No When did it last happen?  90s If all above  answers are "NO", may proceed with cephalosporin use.     Review of Systems  Constitutional:  Negative for chills and fever.  Respiratory:  Negative for cough.   Cardiovascular:  Negative for chest pain.  Gastrointestinal:  Negative for abdominal pain, blood in stool, melena, nausea and vomiting.  Genitourinary:  Negative for dysuria.  Neurological:  Positive for dizziness. Negative for focal weakness.      Objective:     BP 94/62 (BP Location: Left Arm, Cuff Size: Normal)   Pulse 81   Temp 97.8 F (36.6 C) (Oral)   SpO2 97%  BP Readings from Last 3 Encounters:  01/19/24 94/62  01/13/24 102/78  01/03/24 104/78   Wt Readings from Last 3 Encounters:  01/13/24 170 lb 6.4 oz (77.3 kg)  01/03/24 171 lb 11.2 oz (77.9 kg)  12/01/23 178 lb 6.4 oz (80.9 kg)      Physical Exam Vitals reviewed.  Constitutional:      General: She is not in acute distress.    Appearance: She is not ill-appearing.  Cardiovascular:     Rate and Rhythm: Normal rate and regular rhythm.  Pulmonary:     Effort: Pulmonary effort is normal.     Breath sounds: Normal breath sounds. No wheezing  or rales.  Musculoskeletal:     Right lower leg: No edema.     Left lower leg: No edema.  Neurological:     General: No focal deficit present.     Mental Status: She is alert.     Cranial Nerves: No cranial nerve deficit.     Motor: No weakness.     Gait: Gait normal.      Results for orders placed or performed in visit on 01/19/24  Magnesium   Result Value Ref Range   Magnesium  2.1 1.5 - 2.5 mg/dL  CMP  Result Value Ref Range   Sodium 140 135 - 145 mEq/L   Potassium 4.2 3.5 - 5.1 mEq/L   Chloride 106 96 - 112 mEq/L   CO2 26 19 - 32 mEq/L   Glucose, Bld 132 (H) 70 - 99 mg/dL   BUN 25 (H) 6 - 23 mg/dL   Creatinine, Ser 9.14 0.40 - 1.20 mg/dL   Total Bilirubin 0.6 0.2 - 1.2 mg/dL   Alkaline Phosphatase 76 39 - 117 U/L   AST 13 0 - 37 U/L   ALT 12 0 - 35 U/L   Total Protein 6.1 6.0 - 8.3 g/dL   Albumin 3.8 3.5 - 5.2 g/dL   GFR 35.03 >39.99 mL/min   Calcium  9.0 8.4 - 10.5 mg/dL    Last CBC Lab Results  Component Value Date   WBC 5.3 09/21/2023   HGB 12.5 09/21/2023   HCT 36.8 09/21/2023   MCV 89.8 09/21/2023   MCH 30.5 09/21/2023   RDW 13.8 09/21/2023   PLT 172 09/21/2023   Last metabolic panel Lab Results  Component Value Date   GLUCOSE 132 (H) 01/19/2024   NA 140 01/19/2024   K 4.2 01/19/2024   CL 106 01/19/2024   CO2 26 01/19/2024   BUN 25 (H) 01/19/2024   CREATININE 0.85 01/19/2024   GFR 64.96 01/19/2024   CALCIUM  9.0 01/19/2024   PROT 6.1 01/19/2024   ALBUMIN 3.8 01/19/2024   LABGLOB 2.4 10/19/2022   AGRATIO 2.0 10/07/2021   BILITOT 0.6 01/19/2024   ALKPHOS 76 01/19/2024   AST 13 01/19/2024   ALT 12 01/19/2024   ANIONGAP 13 09/21/2023   Last hemoglobin A1c  Lab Results  Component Value Date   HGBA1C 6.6 (A) 12/01/2023   Last thyroid  functions Lab Results  Component Value Date   TSH 1.75 10/20/2021   FREET4 1.1 09/13/2015      The ASCVD Risk score (Arnett DK, et al., 2019) failed to calculate for the following reasons:   Risk score  cannot be calculated because patient has a medical history suggesting prior/existing ASCVD    Assessment & Plan:   Problem List Items Addressed This Visit   None Visit Diagnoses       Orthostasis    -  Primary   Relevant Orders   CMP (Completed)     Leg cramps       Relevant Orders   CMP (Completed)   Magnesium  (Completed)     Ms. Hankey is seen today with several day history of orthostatic type dizziness.  Blood pressure repeat 115/70 left arm seated and standing went to 94/62.  -Patient recently taken off diltiazem  - Stressed importance of adequate hydration and changing positions slowly. - Liberalize sodium slightly - Check comprehensive metabolic panel and magnesium  level - We may need to hold her Lasix  if blood pressure not improving and dizziness not resolving with the above  No follow-ups on file.    Wolm Scarlet, MD

## 2024-01-20 ENCOUNTER — Telehealth: Payer: Self-pay | Admitting: Cardiology

## 2024-01-20 MED ORDER — PREDNISONE 20 MG PO TABS: ORAL_TABLET | ORAL | 0 refills | Status: AC

## 2024-01-20 NOTE — Addendum Note (Signed)
 Addended by: METTA MATTOCKS L on: 01/20/2024 11:49 AM   Modules accepted: Orders

## 2024-01-20 NOTE — Telephone Encounter (Signed)
 Spoke with pt and appointment scheduled with Dr Kriste 01/21/2024 at 140pm per Dr Dorine request.

## 2024-01-20 NOTE — Telephone Encounter (Signed)
 Pt c/o BP issue: STAT if pt c/o blurred vision, one-sided weakness or slurred speech.  STAT if BP is GREATER than 180/120 TODAY.  STAT if BP is LESS than 90/60 and SYMPTOMATIC TODAY  1. What is your BP concern?   See below  2. Have you taken any BP medication today?  Not taken today  3. What are your last 5 BP readings?  94/62 - taken yesterday over PCP  4. Are you having any other symptoms (ex. Dizziness, headache, blurred vision, passed out)?   Dizziness on standing   Patient stated her blood pressure had been dropping only when standing.  Patient noted her son passed away and [place] of [place].

## 2024-01-20 NOTE — Telephone Encounter (Signed)
 She reports that her son passed away and she is in the process of selling her house and moving. She will be moving to [place]  Monday morning, so she is on a time constraint.   When she stands up she is dizzy. Her bp is normal when lying and sitting, but when she stands it goes down. She was just seen by her PCP yesterday. Lab work was checked and sodium, potassium, and magnesium  normal; Creatinine 0.85 and BUN 25.   She is wondering what else she needs to do to help with this. Does her Metoprolol  need to be decreased?  Informed her that I will send this information to her provider and we will call with recommendations. She verbalized understanding.    This is from Office visit note 01/19/24 with PCP: [name] is seen today with several day history of orthostatic type dizziness.  Blood pressure repeat 115/70 left arm seated and standing went to 94/62.   -Patient recently taken off diltiazem  - Stressed importance of adequate hydration and changing positions slowly. - Liberalize sodium slightly - Check comprehensive metabolic panel and magnesium  level - We may need to hold her Lasix  if blood pressure not improving and dizziness not resolving with the above   No follow-ups on file.      Angela Scarlet, MD

## 2024-01-21 ENCOUNTER — Ambulatory Visit: Attending: Internal Medicine | Admitting: Internal Medicine

## 2024-01-21 ENCOUNTER — Other Ambulatory Visit: Payer: Self-pay | Admitting: Family Medicine

## 2024-01-21 ENCOUNTER — Encounter: Payer: Self-pay | Admitting: Internal Medicine

## 2024-01-21 VITALS — BP 118/76 | HR 88 | Ht 66.0 in | Wt 170.0 lb

## 2024-01-21 DIAGNOSIS — I951 Orthostatic hypotension: Secondary | ICD-10-CM | POA: Diagnosis not present

## 2024-01-21 DIAGNOSIS — I4821 Permanent atrial fibrillation: Secondary | ICD-10-CM | POA: Diagnosis not present

## 2024-01-21 DIAGNOSIS — I350 Nonrheumatic aortic (valve) stenosis: Secondary | ICD-10-CM

## 2024-01-21 DIAGNOSIS — I5032 Chronic diastolic (congestive) heart failure: Secondary | ICD-10-CM

## 2024-01-21 DIAGNOSIS — I10 Essential (primary) hypertension: Secondary | ICD-10-CM

## 2024-01-21 NOTE — Progress Notes (Signed)
 Cardiology Office Note   Date:  01/21/2024  ID:  Marvie Brevik, DOB [birth year], MRN [id number] PCP: Micheal Wolm ORN, MD  [facility] Providers Cardiologist:  Wilbert Bihari, MD     History of Present Illness [name] is a very pleasant 78 y.o. female with a past medical history of permanent atrial fibrillation, mitral stenosis, hypertension, chronic diastolic heart failure, suspected low-flow low gradient moderate aortic stenosis, dilated cardiomyopathy, type 2 diabetes, non-Hodgkin's lymphoma, obesity, hyperlipidemia who presents today to discuss orthostatic hypotension.  Of note, unfortunately the patient's son passed away and she is in the process of selling her house and moving to [place]  early next week.  Since he passed away roughly 2 weeks ago she has not had much of an appetite and is forcing herself to eat and drink but is not taking in as much as she typically does.  Throughout this time she has been feeling dizziness when she stands up.  She was seen by her PCP on 10/29 and her blood pressure dropped from 115/70 to 94/62 in the office prompting her to come today.    She has not had any syncopal episodes.  Denies chest pain or shortness of breath or any other complaints.   ROS:  Review of Systems  All other systems reviewed and are negative.   Physical Exam  Physical Exam Vitals and nursing note reviewed.  Constitutional:      Appearance: Normal appearance.  HENT:     Head: Normocephalic and atraumatic.  Eyes:     Conjunctiva/sclera: Conjunctivae normal.  Neck:     Vascular: No carotid bruit.  Cardiovascular:     Rate and Rhythm: Normal rate. Rhythm irregular.     Heart sounds: Murmur heard.  Pulmonary:     Effort: Pulmonary effort is normal.     Breath sounds: Normal breath sounds.  Musculoskeletal:        General: No swelling or tenderness.  Skin:    Coloration: Skin is not jaundiced or pale.  Neurological:     Mental Status: She is  alert.     VS:  BP 118/76 (BP Location: Left Arm, Patient Position: Sitting, Cuff Size: Large)   Pulse 88   Ht 5' 6 (1.676 m)   Wt 170 lb (77.1 kg)   SpO2 95%   BMI 27.44 kg/m         Wt Readings from Last 3 Encounters:  01/21/24 170 lb (77.1 kg)  01/13/24 170 lb 6.4 oz (77.3 kg)  01/03/24 171 lb 11.2 oz (77.9 kg)     EKG Interpretation Date/Time:    Ventricular Rate:    PR Interval:    QRS Duration:    QT Interval:    QTC Calculation:   R Axis:      Text Interpretation:      Studies Reviewed   Echo 10/19/23:   1. Left ventricular ejection fraction, by estimation, is 50 to 55%. The  left ventricle has low normal function. The left ventricle has no regional  wall motion abnormalities. There is mild concentric left ventricular  hypertrophy. Left ventricular  diastolic function could not be evaluated.   2. Right ventricular systolic function is normal. The right ventricular  size is normal.   3. Left atrial size was severely dilated.   4. The mitral valve is normal in structure. Mild to moderate mitral valve  regurgitation. No evidence of mitral stenosis. Moderate to severe mitral  annular calcification.   5. Tricuspid valve regurgitation  is mild to moderate.   6. The inferior vena cava is normal in size with greater than 50%  respiratory variability, suggesting right atrial pressure of 3 mmHg.   7. The aortic valve is calcified. There is moderate calcification of the  aortic valve. There is moderate thickening of the aortic valve. Aortic  valve regurgitation is not visualized. Suspect paradoxical low flow low  gradient moderate aortic stenosis.  Aortic valve area, by VTI measures 1.32 cm. Aortic valve mean gradient  measures 7.8 mmHg. Aortic valve Vmax measures 2.03 m/s, SVI 25, DI 0.32.       Risk Assessment/Calculations        ASCVD risk score: The ASCVD Risk score (Arnett DK, et al., 2019) failed to calculate for the following reasons:   Risk  score cannot be calculated because patient has a medical history suggesting prior/existing ASCVD   ASSESSMENT  Orthostatic hypotension, likely due to poor p.o. intake and exacerbated by diuretics in the setting of valvular disease poor p.o. intake due to the death of her son recently and on Lasix  20 mg daily.  Orthostatics checked at recent PCP visit, not checked today Permanent atrial fibrillation on chronic Eliquis  5 mg twice daily and rate controlled with metoprolol  succinate 25 mg daily Chronic diastolic heart failure Suspected low-flow low gradient moderate aortic stenosis noted on recent echo   Plan  Patient was advised to hold Lasix  for now until she is able to normalize her p.o. intake Will not discontinue metoprolol  to avoid rebound tachycardia  She was advised on signs and symptoms of volume overload/heart failure such as 5 pound weight gain in 3 days, orthopnea, PND, lower extremity swelling, etc.  She was advised to restart Lasix  if she notices these symptoms She was advised to restart Lasix  after she has normalized her diet   Follow up: She is moving to [place]  next week and is going to look for a new cardiologist there but has not established anywhere yet          Signed, Emeline FORBES Calender, MD

## 2024-01-21 NOTE — Patient Instructions (Signed)
 Medication Instructions:  Hold furosemide  for now *If you need a refill on your cardiac medications before your next appointment, please call your pharmacy*  Lab Work: none If you have labs (blood work) drawn today and your tests are completely normal, you will receive your results only by: MyChart Message (if you have MyChart) OR A paper copy in the mail If you have any lab test that is abnormal or we need to change your treatment, we will call you to review the results.  Testing/Procedures: none  Follow-Up: At [facility], you and your health needs are our priority.  As part of our continuing mission to provide you with exceptional heart care, our providers are all part of one team.  This team includes your primary Cardiologist (physician) and Advanced Practice Providers or APPs (Physician Assistants and Nurse Practitioners) who all work together to provide you with the care you need, when you need it.  Your next appointment:   As needed  Provider:   Wilbert Bihari, MD    We recommend signing up for the patient portal called MyChart.  Sign up information is provided on this After Visit Summary.  MyChart is used to connect with patients for Virtual Visits (Telemedicine).  Patients are able to view lab/test results, encounter notes, upcoming appointments, etc.  Non-urgent messages can be sent to your provider as well.   To learn more about what you can do with MyChart, go to [email or web address].   Other Instructions  Heart Failure: How to Prevent It Heart failure is a condition where the heart has trouble pumping blood. This may mean that the heart can't pump enough blood out to the body or that the heart doesn't fill up with enough blood. These problems can lead to symptoms like tiredness, trouble breathing, and swelling in the body. Heart failure is a common medical condition that affects the whole body, not just the heart. By making changes to your diet and lifestyle,  you can help prevent heart failure and avoid serious health problems. How can this condition affect me? Heart failure can cause some serious problems that may get worse over time. These may include: Feeling tired during normal physical activities. Trouble breathing. Swelling in your legs, ankles, or feet. Weight gain. A cough. What can increase my risk? The risk of heart failure increases as a person gets older. The most common causes are high blood pressure and blocked blood vessels known as coronary artery disease.  Other things that may make you more likely to get heart failure include: Having any of these medical conditions: Heart valve problems. Diabetes. Thyroid  problems. Heart rhythm problems. Low blood counts. This is called anemia. Lung disease. Chronic kidney disease. Using tobacco or nicotine products. Using alcohol or other substances. Taking medicines that can damage the heart, like chemotherapy drugs. Having a family history of heart failure. Being very overweight or obese. Infections caused by viruses. What actions can I take to prevent heart failure? Eating and drinking  If you're overweight or obese, talk with your health care provider about how many calories you should have each day to help you lose weight. Eat foods that are low in salt (sodium) and avoid adding extra salt to foods. Eat a balanced diet that includes: Fresh fruits and vegetables. Whole grains. Lean protein. Beans. Fat-free or low-fat dairy products. Seafood 1-2 days a week. Avoid foods that have a lot of: Trans fats. Saturated fats. Sugar. Cholesterol. You may want to work with an expert in healthy  eating called a dietitian. Alcohol Do not drink alcohol if: Your provider tells you not to drink. You're pregnant, may be pregnant, or plan to become pregnant. If you drink alcohol: Limit how much you have to: 0-1 drink a day if you're female. 0-2 drinks a day if you're female. Know how  much alcohol is in your drink. In the [place] one drink is one 12 oz bottle of beer (355 mL), one 5 oz glass of wine (148 mL), or one 1 oz glass of hard liquor (44 mL). Lifestyle  Do not smoke, vape, or use nicotine or tobacco. Exercise for at least 30 minutes, 5 days each week, or as much as told by your provider. Do moderate-intensity exercise, like brisk walking, biking, or water  aerobics. Ask your provider which activities are safe for you. Try to get 7-9 hours of sleep each night. To help with sleep: Keep your bedroom cool and dark. Do not eat a heavy meal during the hour before you go to bed. Do not drink alcohol or drinks with caffeine before bed. Avoid screentime before bedtime. This means avoiding TV, computers, tablets, and mobile phones. Find ways to relax and manage stress. These may include: Breathing exercises. Meditation. Yoga. Listening to music. General instructions See a provider regularly for screening and wellness checks. Work with your provider to manage your: Blood pressure. Cholesterol levels. Blood sugar, also called glucose levels. Weight. Where to find more information National Heart, Lung, and Blood Institute: buffalodrycleaner.gl National Institute on Aging: baseringtones.pl American Heart Association: heart.org Contact a health care provider if: You're gaining weight quickly. You have increasing shortness of breath that's unusual for you. You tire easily or you're not able to do your usual activities. You cough more than normal, especially when doing physical activity. You have swelling, or more swelling, in areas such as your hands, feet, or ankles. You have fast or irregular heartbeats (palpitations). Get help right away if: You have trouble breathing. You have pain or discomfort in your chest. You faint. These symptoms may be an emergency. Call 911 right away. Do not wait to see if the symptoms will go away. Do not drive yourself to the hospital. This  information is not intended to replace advice given to you by your health care provider. Make sure you discuss any questions you have with your health care provider. Document Revised: 08/14/2022 Document Reviewed: 08/14/2022 Elsevier Patient Education  2025 [employer].

## 2024-01-21 NOTE — Telephone Encounter (Signed)
 Copied from CRM #[id number]. Topic: Clinical - Medication Refill >> Jan 21, 2024  9:02 AM Franky GRADE wrote: Medication: predniSONE  (DELTASONE ) 20 MG tablet [[id number]]  Has the patient contacted their pharmacy? No (Agent: If no, request that the patient contact the pharmacy for the refill. If patient does not wish to contact the pharmacy document the reason why and proceed with request.) (Agent: If yes, when and what did the pharmacy advise?)  This is the patient's preferred pharmacy:   [id number] [place],  - [address] AT [facility] [address] [facility] [address] [facility] [address] [place] [address] Phone: [phone] Fax: [phone]  Is this the correct pharmacy for this prescription? Yes If no, delete pharmacy and type the correct one.   Has the prescription been filled recently? No  Is the patient out of the medication? Yes  Has the patient been seen for an appointment in the last year OR does the patient have an upcoming appointment? Yes  Can we respond through MyChart? No  Agent: Please be advised that Rx refills may take up to 3 business days. We ask that you follow-up with your pharmacy.

## 2024-01-22 ENCOUNTER — Telehealth: Payer: Self-pay | Admitting: Student

## 2024-01-22 NOTE — Telephone Encounter (Signed)
    The patient called the after-hours line reporting that Lasix  was changed to as needed at her appointment yesterday given orthostatic hypotension and decreased oral intake. She was unsure if she should take potassium and we reviewed that she should only take the potassium supplement when she takes Lasix . K+ was at 4.2 when checked earlier this week. She voiced understanding of this and was appreciative of the return call.  Signed, Laymon CHRISTELLA Qua, PA-C 01/22/2024, 11:52 AM Pager: [phone]

## 2024-02-15 ENCOUNTER — Other Ambulatory Visit: Payer: Self-pay

## 2024-02-15 MED ORDER — ALPRAZOLAM 1 MG PO TABS: ORAL_TABLET | ORAL | 0 refills | Status: AC

## 2024-02-15 NOTE — Telephone Encounter (Signed)
 Copied from CRM [id number]. Topic: Clinical - Medication Question >> Feb 14, 2024  5:14 PM Shereese L wrote: Reason for CRM: patient has moved to [facility] and current pcp is requesting a prescription to be faxed to the pharmacy to confirm that it was prescribed by a physician  ALPRAZolam  (XANAX ) 1 MG tablet Patient CB# [phone]  [facility] pharmacy in [place] Fax# [phone]

## 2024-02-15 NOTE — Telephone Encounter (Signed)
 I sent in 1 refill but she will need to establish with new primary up in [place] soon  Wolm LELON Scarlet MD [facility]

## 2024-02-21 ENCOUNTER — Ambulatory Visit: Admitting: Podiatry

## 2024-03-08 ENCOUNTER — Telehealth: Payer: Self-pay | Admitting: Cardiology

## 2024-04-26 ENCOUNTER — Telehealth: Payer: Self-pay | Admitting: Cardiology

## 2024-04-26 NOTE — Telephone Encounter (Signed)
 [facility] requesting office notes and echo results for the pts cardiologist in [place] . Please fax to [phone]

## 2024-07-26 ENCOUNTER — Ambulatory Visit
